# Patient Record
Sex: Female | Born: 1935 | Race: White | Hispanic: No | Smoking: Never smoker
Health system: Southern US, Community
[De-identification: ages and names within clinical notes are randomized; demographics above are authoritative.]

## PROBLEM LIST (undated history)

## (undated) DIAGNOSIS — S329XXA Fracture of unspecified parts of lumbosacral spine and pelvis, initial encounter for closed fracture: Secondary | ICD-10-CM

## (undated) DIAGNOSIS — I4891 Unspecified atrial fibrillation: Secondary | ICD-10-CM

## (undated) DIAGNOSIS — I2510 Atherosclerotic heart disease of native coronary artery without angina pectoris: Secondary | ICD-10-CM

## (undated) DIAGNOSIS — G14 Postpolio syndrome: Secondary | ICD-10-CM

## (undated) DIAGNOSIS — I639 Cerebral infarction, unspecified: Secondary | ICD-10-CM

## (undated) DIAGNOSIS — E785 Hyperlipidemia, unspecified: Secondary | ICD-10-CM

## (undated) DIAGNOSIS — R0609 Other forms of dyspnea: Secondary | ICD-10-CM

## (undated) DIAGNOSIS — N3010 Interstitial cystitis (chronic) without hematuria: Secondary | ICD-10-CM

## (undated) DIAGNOSIS — R0989 Other specified symptoms and signs involving the circulatory and respiratory systems: Secondary | ICD-10-CM

## (undated) DIAGNOSIS — Z951 Presence of aortocoronary bypass graft: Secondary | ICD-10-CM

## (undated) DIAGNOSIS — I119 Hypertensive heart disease without heart failure: Secondary | ICD-10-CM

## (undated) DIAGNOSIS — F4323 Adjustment disorder with mixed anxiety and depressed mood: Secondary | ICD-10-CM

## (undated) DIAGNOSIS — M8580 Other specified disorders of bone density and structure, unspecified site: Secondary | ICD-10-CM

## (undated) DIAGNOSIS — N390 Urinary tract infection, site not specified: Secondary | ICD-10-CM

## (undated) HISTORY — DX: Other specified disorders of bone density and structure, unspecified site: M85.80

## (undated) HISTORY — PX: HERNIA REPAIR: SHX51

## (undated) HISTORY — PX: CATARACT EXTRACTION: SUR2

## (undated) HISTORY — DX: Hypertensive heart disease without heart failure: I11.9

## (undated) HISTORY — PX: ABDOMINAL HYSTERECTOMY: SHX81

## (undated) HISTORY — PX: CHOLECYSTECTOMY: SHX55

## (undated) HISTORY — DX: Cerebral infarction, unspecified: I63.9

## (undated) HISTORY — DX: Interstitial cystitis (chronic) without hematuria: N30.10

## (undated) HISTORY — DX: Postpolio syndrome: G14

## (undated) HISTORY — PX: FRACTURE SURGERY: SHX138

## (undated) HISTORY — DX: Hyperlipidemia, unspecified: E78.5

## (undated) HISTORY — DX: Adjustment disorder with mixed anxiety and depressed mood: F43.23

## (undated) HISTORY — DX: Atherosclerotic heart disease of native coronary artery without angina pectoris: I25.10

## (undated) HISTORY — DX: Other specified symptoms and signs involving the circulatory and respiratory systems: R09.89

## (undated) HISTORY — DX: Other forms of dyspnea: R06.09

---

## 1992-10-27 ENCOUNTER — Encounter (INDEPENDENT_AMBULATORY_CARE_PROVIDER_SITE_OTHER): Payer: Self-pay | Admitting: *Deleted

## 1992-11-15 ENCOUNTER — Encounter (INDEPENDENT_AMBULATORY_CARE_PROVIDER_SITE_OTHER): Payer: Self-pay | Admitting: *Deleted

## 1998-03-24 ENCOUNTER — Ambulatory Visit (HOSPITAL_BASED_OUTPATIENT_CLINIC_OR_DEPARTMENT_OTHER): Admission: RE | Admit: 1998-03-24 | Discharge: 1998-03-24 | Payer: Self-pay | Admitting: Orthopedic Surgery

## 1998-07-12 ENCOUNTER — Encounter: Admission: RE | Admit: 1998-07-12 | Discharge: 1998-10-10 | Payer: Self-pay | Admitting: Orthopedic Surgery

## 2002-03-17 ENCOUNTER — Encounter: Admission: RE | Admit: 2002-03-17 | Discharge: 2002-03-17 | Payer: Self-pay | Admitting: Family Medicine

## 2002-03-17 ENCOUNTER — Encounter: Payer: Self-pay | Admitting: Family Medicine

## 2003-07-09 ENCOUNTER — Other Ambulatory Visit: Admission: RE | Admit: 2003-07-09 | Discharge: 2003-07-09 | Payer: Self-pay | Admitting: Family Medicine

## 2003-08-27 ENCOUNTER — Encounter: Payer: Self-pay | Admitting: Gastroenterology

## 2003-08-27 DIAGNOSIS — K5730 Diverticulosis of large intestine without perforation or abscess without bleeding: Secondary | ICD-10-CM | POA: Insufficient documentation

## 2005-10-30 ENCOUNTER — Other Ambulatory Visit: Admission: RE | Admit: 2005-10-30 | Discharge: 2005-10-30 | Payer: Self-pay | Admitting: Family Medicine

## 2006-02-16 ENCOUNTER — Encounter: Admission: RE | Admit: 2006-02-16 | Discharge: 2006-02-16 | Payer: Self-pay | Admitting: Nephrology

## 2006-03-14 ENCOUNTER — Ambulatory Visit: Payer: Self-pay

## 2006-11-27 HISTORY — PX: CARDIAC CATHETERIZATION: SHX172

## 2007-02-25 ENCOUNTER — Ambulatory Visit (HOSPITAL_COMMUNITY): Admission: RE | Admit: 2007-02-25 | Discharge: 2007-02-25 | Payer: Self-pay | Admitting: Family Medicine

## 2007-03-20 ENCOUNTER — Ambulatory Visit: Payer: Self-pay | Admitting: Gastroenterology

## 2007-03-20 LAB — CONVERTED CEMR LAB
Bilirubin, Direct: 0.2 mg/dL (ref 0.0–0.3)
Ceruloplasmin: 41 mg/dL (ref 21–63)
Ferritin: 115.1 ng/mL (ref 10.0–291.0)
HCT: 41 % (ref 36.0–46.0)
HCV Ab: NEGATIVE
INR: 1 (ref 0.9–2.0)
Prothrombin Time: 12.1 s (ref 10.0–14.0)
Total Protein: 8.1 g/dL (ref 6.0–8.3)
aPTT: 32.4 s (ref 26.5–36.5)

## 2007-04-01 ENCOUNTER — Ambulatory Visit (HOSPITAL_COMMUNITY): Admission: RE | Admit: 2007-04-01 | Discharge: 2007-04-01 | Payer: Self-pay | Admitting: Gastroenterology

## 2007-04-02 ENCOUNTER — Ambulatory Visit: Payer: Self-pay | Admitting: Gastroenterology

## 2007-04-17 ENCOUNTER — Ambulatory Visit (HOSPITAL_COMMUNITY): Admission: RE | Admit: 2007-04-17 | Discharge: 2007-04-17 | Payer: Self-pay | Admitting: Gastroenterology

## 2007-04-17 ENCOUNTER — Encounter: Payer: Self-pay | Admitting: Gastroenterology

## 2007-04-24 ENCOUNTER — Ambulatory Visit: Payer: Self-pay | Admitting: Gastroenterology

## 2007-04-25 ENCOUNTER — Ambulatory Visit: Payer: Self-pay | Admitting: Gastroenterology

## 2007-06-13 ENCOUNTER — Ambulatory Visit: Payer: Self-pay | Admitting: Cardiology

## 2007-06-13 ENCOUNTER — Inpatient Hospital Stay (HOSPITAL_COMMUNITY): Admission: EM | Admit: 2007-06-13 | Discharge: 2007-06-16 | Payer: Self-pay | Admitting: Emergency Medicine

## 2007-07-24 ENCOUNTER — Ambulatory Visit: Payer: Self-pay | Admitting: Cardiology

## 2008-05-02 DIAGNOSIS — I10 Essential (primary) hypertension: Secondary | ICD-10-CM | POA: Insufficient documentation

## 2008-05-02 DIAGNOSIS — B91 Sequelae of poliomyelitis: Secondary | ICD-10-CM | POA: Insufficient documentation

## 2008-05-02 DIAGNOSIS — K738 Other chronic hepatitis, not elsewhere classified: Secondary | ICD-10-CM | POA: Insufficient documentation

## 2008-05-02 DIAGNOSIS — E7800 Pure hypercholesterolemia, unspecified: Secondary | ICD-10-CM | POA: Insufficient documentation

## 2008-05-02 DIAGNOSIS — N3010 Interstitial cystitis (chronic) without hematuria: Secondary | ICD-10-CM

## 2008-05-02 DIAGNOSIS — C549 Malignant neoplasm of corpus uteri, unspecified: Secondary | ICD-10-CM

## 2008-08-11 ENCOUNTER — Encounter: Payer: Self-pay | Admitting: Gastroenterology

## 2009-03-11 ENCOUNTER — Telehealth: Payer: Self-pay | Admitting: Gastroenterology

## 2009-07-25 ENCOUNTER — Encounter: Payer: Self-pay | Admitting: Cardiology

## 2009-12-15 LAB — HM DEXA SCAN

## 2011-02-15 ENCOUNTER — Encounter: Payer: Self-pay | Admitting: Family Medicine

## 2011-02-15 DIAGNOSIS — R319 Hematuria, unspecified: Secondary | ICD-10-CM | POA: Insufficient documentation

## 2011-02-15 DIAGNOSIS — M810 Age-related osteoporosis without current pathological fracture: Secondary | ICD-10-CM | POA: Insufficient documentation

## 2011-02-15 DIAGNOSIS — R0609 Other forms of dyspnea: Secondary | ICD-10-CM

## 2011-02-15 DIAGNOSIS — I119 Hypertensive heart disease without heart failure: Secondary | ICD-10-CM

## 2011-02-15 DIAGNOSIS — N289 Disorder of kidney and ureter, unspecified: Secondary | ICD-10-CM

## 2011-02-15 DIAGNOSIS — N3010 Interstitial cystitis (chronic) without hematuria: Secondary | ICD-10-CM

## 2011-02-15 DIAGNOSIS — I2510 Atherosclerotic heart disease of native coronary artery without angina pectoris: Secondary | ICD-10-CM | POA: Insufficient documentation

## 2011-02-15 DIAGNOSIS — F4323 Adjustment disorder with mixed anxiety and depressed mood: Secondary | ICD-10-CM | POA: Insufficient documentation

## 2011-02-15 DIAGNOSIS — E785 Hyperlipidemia, unspecified: Secondary | ICD-10-CM

## 2011-02-15 DIAGNOSIS — M8580 Other specified disorders of bone density and structure, unspecified site: Secondary | ICD-10-CM

## 2011-04-11 NOTE — Cardiovascular Report (Signed)
Emily Wagner, [name]:  [id number]   MEDICAL RECORD NO.:  [id number]          PATIENT TYPE:  INP   LOCATION:  2905                         FACILITY:  [facility]   PHYSICIAN:  Everardo Beals. [name], MD, FACCDATE OF BIRTH:  1933/08/29   DATE OF PROCEDURE:  06/14/2007  DATE OF DISCHARGE:                            CARDIAC CATHETERIZATION   CLINICAL HISTORY:  Mr. Emily Wagner is 77 years old.  She has a history of  nonobstructive coronary disease at catheterization in 1990 by Dr.  Riley Kill with a 60% lesion in the LAD.  Yesterday while at the beauty  shop she developed some back pain and then became lightheaded and dizzy  and fell to the floor and almost completely blacked out.  EMS was called  and her pulse was in the 40s and her blood pressure was in the 80s.  She  responded to treatment and improved and was brought to the hospital for  further evaluation.  She was scheduled for evaluation with cardiac  catheterization to rule out an ischemic etiology.   PROCEDURE:  The procedure was performed via the right femoral artery  using an arterial sheath and 6-French preformed coronary catheters.  A  front wall arterial puncture was performed and Omnipaque contrast was  used.  The right femoral artery was closed with an Angio-Seal at the end  of the procedure.  The patient tolerated the procedure well and left the  laboratory in satisfactory condition.   RESULTS:  The aortic pressure was 125/70 with a mean of 103 and the left  ventricular pressure was 125/15.   The left main coronary artery:  The left main coronary was free of  significant disease.   Left anterior descending artery:  The left anterior descending artery  gave rise to two diagonal branches and a large septal perforator and  then two smaller diagonal branches.  There was moderate calcification in  the proximal LAD.  There was a 70% stenosis located at the takeoff of  the first diagonal branch.  There was a 60% stenosis  in the mid vessel  just after the septal perforator.   The circumflex artery:  The circumflex artery gave rise to a small  marginal branch and a large posterolateral branch.  These vessels were  free of significant disease.   The right coronary artery:  The right coronary artery was a moderate-  sized vessel and gave rise to a conus branch, two right ventricular  branches, a posterior descending branch and three posterolateral  branches.  This vessel was irregular but there was no major obstruction.   No left ventriculogram was performed due to the patient's creatinine of  1.7.   CONCLUSION:  Coronary artery disease with 70% proximal and 60% mid  stenosis in the left anterior descending artery, no major obstruction in  the circumflex or right coronary arteries.   RECOMMENDATIONS:  It is not clear whether the lesions in the LAD are  flow-limiting.  Will plan evaluation with an adenosine rest-stress  Myoview scan tomorrow.  If she has anterior ischemia, then we will  consider  PCI of the lesions in the LAD.  If her scan is negative, then  we will look for other cause of syncope.  It is possible that her  syncope could have been vasovagal syncope.  We will also schedule an  echocardiogram.      Emily R. [name], MD, [facility]  Electronically Signed     BRB/MEDQ  D:  06/14/2007  T:  06/15/2007  Job:  [id number]   cc:   [name], M.D.  [name], MD, [facility]  Cardiopulmonary Lab

## 2011-04-11 NOTE — H&P (Signed)
NAMEDEWAYNE, Emily Wagner NO.:  [id number]   MEDICAL RECORD NO.:  [id number]          PATIENT TYPE:  EMS   LOCATION:  MAJO                         FACILITY:  [facility]   PHYSICIAN:  Marcellus Scott, MD     DATE OF BIRTH:  [birth year]   DATE OF ADMISSION:  06/13/2007  DATE OF DISCHARGE:                              HISTORY & PHYSICAL   PRIMARY MEDICAL DOCTOR:  [name], M.D.   CARDIOLOGIST:  [name], MD, [facility].   NEPHROLOGIST:  Wilber Bihari. Caryn Section, M.D.   GASTROENTEROLOGIST:  Barbette Hair. Arlyce Dice, MD, [facility].   CHIEF COMPLAINT:  Presyncope, generalized weakness, bradycardia with  hypotension.   HISTORY OF PRESENT ILLNESS:  Emily Wagner is a pleasant 77 year old  Caucasian female patient with extensive past medical history, as  indicated below.  She was in her usual state of health until  approximately 11:45 a.m. today.  She was sitting on the hairdressers  chair for approximately 20 minutes when she started feeling dizzy and  lightheaded.  After she finished with her hairdressing, she got off the  chair, and the symptoms persisted.  She walked to a door and  subsequently felt overwhelmingly weak and collapsed to the floor;  however, the patient did not lose consciousness fully.  At no point does  she give a history of chest pain, palpitations, dyspnea.  She is said to  have lost color and was cold and clammy, according to the bystanders.  EMS was called, and she was noted to have a heart rate in the 40s and  systolic blood pressure in the 80s.  She was given a dose of Atropine  and started on an IV dopamine drip.  An external pacemaker was placed.  The patient was transported to the emergency room.  After approximately  3-1/2 to 4 hours, the patient currently says that she is feeling 80%  better with the weakness almost resolved.  She continues to deny any  cardiorespiratory symptoms.  The patient denies any asymmetrical limb  weakness, slurred  speech, twisting of the mouth.  Her pacemaker is now  turned off.  She is off dopamine and is maintaining a heart rate in the  high 50s and systolic blood pressure in the 90s.   PAST MEDICAL HISTORY:  1. Hypertension.  2. Hyperlipidemia.  3. Single vessel coronary artery disease, status post cath      approximately three years ago.  4. Chronic kidney disease secondary to Feldene/NSAID use.  5. Chronic active hepatitis.  6. 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  7. 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  8. Polio as a child.   PAST SURGICAL HISTORY:  1. Hysterectomy secondary to endometrial carcinoma.  2. Appendectomy.  3. Cholecystectomy.   ALLERGIES:  Patient has multiple allergies.  1. SULFA.  2. PAIN MEDICATIONS, CODEINE, DEMEROL, DARVON.  She also claims she      does not take any over-the-counter pain medications.  3. NONSTEROIDAL ANTI-INFLAMMATORY DRUGS.  4. MACROBID.  5. LIPID-LOWERING MEDICATIONS.  6. ALL OSTEOPOROSIS MEDICATIONS.   CURRENT MEDICATIONS:  1. Propranolol 40 mg p.o. daily.  2. Fexofenadine 180 mg p.o. daily.  3. Verapamil 240 mg p.o. daily.  4. Lorazepam 0.5 mg p.o. nightly.  5. Actigall 300 mg p.o. 3 times a day.   FAMILY HISTORY:  1. Sister who is at the bedside with a history of coronary artery      disease, thyroid cancer.  2. Patient's brother died at age 35 with lung cancer with mets to      brain.  3. Patient's mother died at age 25 of an MI.  4. Patient's father died at age 20 years.  5. Nephew died at age 31 of an MI.   SOCIAL HISTORY:  Patient is widowed.  She works as an Public house manager at  a [employer]  She has no history of smoking, alcohol, or drug  abuse.  She is physically active at home with no cardiorespiratory  symptoms.   REVIEW OF SYSTEMS:  Over 10 systems reviewed in detail with no  contributory features.   PHYSICAL EXAMINATION:  GENERAL:  Emily Wagner is a moderately built and  nourished female patient in no  obvious distress.  VITAL SIGNS:  Temperature afebrile.  Pulse between 55-60 per minute,  regular.  Blood pressure 90/48.  Respirations 16 per minute.  Saturating  at 97% on 4 liters per minute of oxygen via nasal cannula.  HEENT:  Normocephalic and atraumatic.  Bilateral immature cataracts.  Pupils are bilaterally equal, round and reactive to light and  accommodation.  Oral cavity with multiple dental filling and a few  missing teeth but no oropharyngeal erythema or thrush.  NECK:  Without JVD, carotid bruit, lymphadenopathy, or goiter.  Supple.  LYMPHATICS: No lymphadenopathy.  RESPIRATORY:  Clear to auscultation bilaterally.  CARDIOVASCULAR:  First and second heart sounds are heard.  Soft.  No  third or fourth heart sounds, murmurs, rubs, gallops, or clicks.  ABDOMEN:  Nondistended, nontender, no organomegaly or mass appreciated.  Bowel sounds are preserved.  CENTRAL NERVOUS SYSTEM:  Patient is awake, alert and oriented x3 with no  focal neurological deficits.  EXTREMITIES:  No clubbing, cyanosis or edema.  Peripheral pulses are  symmetrically well felt.  SKIN:  Slightly cool.   LAB DATA:  Hepatic panel with total bilirubin of 1.2, direct bilirubin  0.2, indirect bilirubin 1, alkaline phosphatase 86, AST 28, ALT 14,  total protein 6.8, albumin 3.  Point-of-care cardiac markers x2 are  negative.  Creatinine of 1.7, unclear what the patient's baseline  creatinine is.  Venous pH 7.35, pCO2 39.6, bicarb 22, sodium 139,  potassium 4.1, chloride 109, glucose 96, BUN 20, INR 1.  CBC with  hemoglobin of 13.3, hematocrit 38.8, white blood cells 7, platelets 210.  Patient had an AST of 245 in April, 2008 and ALT of 158 with alk phos of  142.   Chest x-ray reviewed by me and reported as linear atelectasis and  bibasilar scarring.   EKG with sinus bradycardia at 51 beats per minute with first-degree AV  block with P-R interval of 213.  There are Q waves in the inferior leads  and anterior  leads.  There is no prior EKG to compare in patient's old  records, and the ER systems are down.   ASSESSMENT/PLAN:  1. Symptomatic sinus bradycardia and hypotension, unclear etiology:      Questionable secondary to medications; however, the patient has not      changed her doses recently nor has she taken extra dosages.  Also,      to rule  out conduction system abnormalities Vs vasovagal.  Will      admit the patient to step-down unit. Will hold her negative      inotropic medications of propranolol and verapamil.  Will cycle the      patient's cardiac enzymes and check D-dimer.  Will also obtain an      echocardiogram.  Will resuscitate with IV fluids.  She has improved      since initial presentation.  For pressors PRN and may need a      temporary pacemaker. Cardiology has been consulted.  2. Presyncope secondary to problem #1:  Management per problem #1.  3. Chronic kidney disease, unclear what the patient's baseline      creatinine is; however, to continue IV fluids and monitor the      patient's basic metabolic panel.  4. Chronic active hepatitis.  ANA was negative in the past.  The      patient's hepatic panel seems to have improved compared to the      prior one.  5. The patient has a living will and is no code blue.      Marcellus Scott, MD  Electronically Signed     AH/MEDQ  D:  06/13/2007  T:  06/13/2007  Job:  [id number]   cc:   Wilber Bihari. Caryn Section, M.D.  [name], MD, [facility]  [name], M.D.  Thomas C. Wall, MD, [name]

## 2011-04-11 NOTE — Assessment & Plan Note (Signed)
[facility]                         GASTROENTEROLOGY OFFICE NOTE   NAME:Hedstrom, ELEFTHERIA TABORN                    MRN:          [id number]  DATE:04/24/2007                            DOB:          02/17/1933    PROBLEM LIST:  Chronic hepatitis.   Mrs. Nida has returned following liver biopsy.  Liver biopsy  demonstrated a chronic active hepatitis, consisting of inflamed bile  ducts and a rare poorly formed granuloma.  Prior MRCP was negative.  Her  AMA was negative.  Only serology that was positive was an anti-smooth  muscle antibody that was 114.  Mrs. Chamblee feels well and has no GI  complaints.   PHYSICAL EXAMINATION:  VITAL SIGNS:  Pulse 64, blood pressure 120/74,  weight 170.   IMPRESSION:  Chronic abnormal liver tests, probably due to either an  autoimmune cholangitis or primary biliary cirrhosis.  Mitigating against  the latter is a negative AMA.  The MRI was not suggestive of sclerosing  cholangitis.   RECOMMENDATIONS:  1. Begin Actigall 300 mg t.i.d.  2. I have referred Mrs. Turcott to Dr. Sharon Mt for a second      opinion.  Should he feel an ERCP is indicated, then I will proceed      with that test.     Barbette Hair. Arlyce Dice, MD,FACG  Electronically Signed    RDK/MedQ  DD: 04/24/2007  DT: 04/24/2007  Job #: [id number]   cc:   [name], M.D.  Sharon Mt, M.D.

## 2011-04-11 NOTE — Consult Note (Signed)
Emily, Wagner NO.:  [id number]   MEDICAL RECORD NO.:  [id number]          PATIENT TYPE:  INP   LOCATION:  2905                         FACILITY:  [facility]   PHYSICIAN:  Jesse Sans. Wall, MD, FACCDATE OF BIRTH:  1933-11-01   DATE OF CONSULTATION:  06/13/2007  DATE OF DISCHARGE:                                 CONSULTATION   We were asked by the hospitalist service to evaluate [name],  who had a presyncopal event today followed by severe bradycardia and  hypotension.   HISTORY OF PRESENT ILLNESS:  She is 78 years of age and carries a  diagnosis of coronary artery disease.  She had a heart catheterization  in 1990 and apparently had a 60-70% LAD at that time.  She has done well  without intervention.  The last objective assessment of coronary disease  was a Cardiolite in 2001 by Dr. Antoine Poche, which showed an EF 78% with no  ischemia.   She was in the beauty shop today in [place].  While she was sitting  there in the chair start to not feel well and got nauseated.  She got up  and got very lightheaded and then her vision blacked out.  She did not  lose complete consciousness and could hear the entire time.  She did  drop to the floor.   When EMS arrived, they could not palpate a pulse or blood pressure.  Her  blood sugar was 107.  They gave her a milligram of atropine and paced  her en route.  Her initial blood pressure was 80/50, heart rate was in  the 40s.   Here in the emergency room she was started on dopamine at 5 mcg.  Her  pressure and heart rate came up and then it was discontinued.  Her blood  pressure and heart rate dropped at that point.   She is now comfortable lying flat and has a blood pressure running  around 110/70.  Her pulse is in the 60s and 70s in sinus rhythm.  Her  first EKG shows sinus bradycardia with a first-degree AV block with no  ST-segment changes.  She does have small Q's inferiorly suggestive of a  previous inferior  wall infarct.   Her past medical history, in addition to the above she has:  1. History of hypertension.  2. Hyperlipidemia, but intolerant to all statins.  3. History of polio as a child.  4. Multiple drug allergies.   Allergies include SULFA, TRIPLE ANTIBIOTIC OINTMENT, NONSTEROIDALS,  SULFA, CODEINE, DEMEROL, DARVON and FELDENE.   She has had a hysterectomy secondary to endometrial carcinoma,  appendectomy, cholecystectomy.   She also has chronic renal insufficiency with a creatinine around 1.7,  which is what it is day.   Her medications are:  1. Propranolol 40 mg once a day.  2. Fexofenadine 180 mg a day.  3. Verapamil 240 mg a day.  4. Lorazepam 0.5 daily.  5. Ursodiol 300 mg p.o. t.i.d.   Her family history is full of coronary disease.  When I go through it,  it  is very positive in regards to her brothers, sister and mother.  Her  father died of an MI at age 38, mother at 70.   SOCIAL HISTORY:  She is widowed.  She is quite active at home.  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  She has two children.  She has  never smoked cigarettes.   REVIEW OF SYSTEMS:  Other than the HPI, is really negative.  She denies  feeling poorly in the last few days.  Her intake has been good.  She had  no nausea, vomiting, diarrhea.   The rest of the review of systems is negative.   EXAM:  Her blood is pressure currently 110 over about 70.  Her pulse is  in the 60s and 70s in sinus rhythm.  EKG shows no acute changes.  O2  saturations 95%, respiratory rate is 18 and unlabored.  She is wearing  O2.  She is lying flat in bed under one pillow with no shortness of breath.  She is slightly pale.  HEENT:  Normocephalic, atraumatic.  PERRLA, extraocular movements  intact.  She wears glasses.  Facial symmetry is normal.  Her speech is  normal.  Cranial nerves II-XII were intact.  Her teeth are crooked but  well-kept.  Her neck is supple.  Carotid upstrokes are equal bilaterally  without  bruits.  There is no JVD.  Thyroid is not enlarged.  Trachea is midline.  Lungs were clear.  Heart reveals a poorly-appreciated PMI.  She has a soft S1-S2 without  gallop.  Abdominal exam is soft, good bowel sounds, no midline bruit.  There is  no hepatomegaly.  EXTREMITIES:  No sinus, clubbing or edema.  Pulses are present.  She has  varicose veins, mostly superficial.  There is no sign of DVT.  Neuro exam is grossly intact at present.   Chest x-ray is pending.   initial laboratory data shows negative point of care markers.  Her  potassium is 4.1, BUN is 20, creatinine 1.7.  Her hemoglobin is 13.9,  white count is 7000, platelets 210,000.   ASSESSMENT:  Ms. Spittler had become hypotensive while sitting in the chair  in the beauty parlor.  She then sat up and probably had presyncope as a  consequence of severe hypotension.  She then was bradycardic.  This begs  the question of an acute coronary ischemic event.   PLAN:  1. Dopamine is now off and I would continue it off at this time as      long as her pressure and blood pressure hold.  2. Aspirin.  3. Heparin.  4. Check serial cardiac enzymes.  5. D-dimer.  6. Intravenous fluids, D5 normal at 125 mL per hour, not only to      rehydrate but to hydrate prior to her catheterization.  7. Cardiac catheterization tomorrow.  Indications, risks and potential      benefits have been discussed with her and her family.  They agree      to proceed.  8. Obviously, hold beta blockers and calcium channel blockers.      Thomas C. Daleen Squibb, MD, [facility]  Electronically Signed     TCW/MEDQ  D:  06/13/2007  T:  06/14/2007  Job:  [id number]   cc:   [name], MD, [facility]  [name], M.D.

## 2011-04-11 NOTE — Assessment & Plan Note (Signed)
[facility]ALTHCARE                            CARDIOLOGY OFFICE NOTE   NAME:Emily Wagner, Emily Wagner                    MRN:          [id number]  DATE:07/24/2007                            DOB:          [birth year]    PRIMARY CARE PHYSICIAN:  [name], M.D.   REASON FOR PRESENTATION:  Evaluate patient with presyncope and coronary  disease.   HISTORY OF PRESENT ILLNESS:  Patient presented on July 17 to [facility]  with an episode of presyncope.  She was hypotensive.  She had sinus  bradycardia.  Because of inferior Q waves and this constellation of  symptoms, she did have a cardiac catheterization demonstrating the left  main to be normal.  The LAD had a 70% stenosis proximally, followed by  60% stenosis.  The circumflex was free of disease.  The right coronary  artery had no major obstruction.  No EF was obtained secondary to mild  renal insufficiency.  Patient had an in-hospital stress perfusion study,  which demonstrated the EF to be 71% with no evidence of ischemia or  infarct.  The patient was take off propranolol, which she had been on  for tremors.  She also had her dose of verapamil reduced to 120.  She  has had no presyncope or syncope since that time.  She remains quite  anxious.  She takes her blood pressure four times a day.  It does  fluctuate with rare readings in the 90s and occasionally the 150s.  The  vast predominance are between 120 and 130s.  She has had no chest  discomfort or neck discomfort.  She has had no palpitations.  She has no  shortness of breath, PND, or orthopnea.  She is still having a worsening  tremor.   PAST MEDICAL HISTORY:  Hypertension, hyperlipidemia, coronary artery  disease, as described, chronic kidney disease (secondary to  nonsteroidals), chronic active hepatitis, questionable autoimmune  cholangitis, questionable primary biliary cirrhosis, polio as a child,  hysterectomy secondary to endometrial carcinoma,  appendectomy,  cholecystectomy.   ALLERGIES/INTOLERANCES:  SULFA, CODEINE, DEMEROL, DARVON, MOST PAIN  MEDICATIONS, NONSTEROIDALS, MACROBID, LIPID-LOWERING DRUGS, ALL  OSTEOPOROSIS MEDICATIONS.   MEDICATIONS:  1. Verapamil 120 mg daily.  2. Lorazepam 0.5 mg daily.   REVIEW OF SYSTEMS:  As stated in the HPI, otherwise negative for other  systems.   PHYSICAL EXAMINATION:  Patient is in no acute distress.  Blood pressure 158/98, heart rate 90 and regular.  NECK:  No jugular venous distention at 45 degrees.  Carotid upstroke  brisk and symmetric.  No bruits, no thyromegaly.  LYMPHATICS:  No adenopathy.  LUNGS:  Clear to auscultation bilaterally.  CHEST:  Unremarkable.  HEART:  PMI not displaced or sustained.  S1 and S2 within normal limits.  No S3, no S4, no clicks, rubs, or murmurs.  ABDOMEN:  Flat, positive bowel sounds.  Normal in frequency and pitch.  No bruits, rebound, guarding.  There are no midline pulsatile masses.  No hepatomegaly, no splenomegaly.  SKIN:  No rashes, no nodules.  EXTREMITIES:  Pulses 2+ throughout.  No edema.  EKG:  Sinus rhythm, poor anterior R wave progression (suggest lead  placement), questionable old inferior infarct, nonspecific lateral T  wave flattening, poor anterior R wave progression is new compared to  previously.   ASSESSMENT/PLAN:  1. Coronary disease:  Patient had a cath and Cardiolite as described.      No further workup is planned.  She needs aggressive risk reduction.      She will not tolerate statins.  She will agree to take an aspirin      81 mg, which I think is indicated.  She will not tolerate beta      blockers.  2. Hypertension:  Her blood pressure is actually very well controlled.      I told her she only needs to take her blood pressure 4-5 times a      week, not four times a day.  She can remain on the current dose of      verapamil.  3. Tremors:  I told her to discuss this with Dr. Christell Constant.  I think these      will be  difficult to treat, as she has had so many medication      intolerances.  4. Followup:  We will see the patient again in six months or sooner if      needed.     [name], MD, [facility]  Electronically Signed    JH/MedQ  DD: 07/24/2007  DT: 07/24/2007  Job #: [id number]

## 2011-04-11 NOTE — Assessment & Plan Note (Signed)
[place] [facility]                         GASTROENTEROLOGY OFFICE NOTE   NAME:Asay, MARQUIS DILES                    MRN:          [id number]  DATE:04/02/2007                            DOB:          1933/05/23    PROBLEM:  Abnormal liver tests.  Mrs. Kimm has returned for ongoing  evaluation.  Serologies for abnormal liver test are positive for anti-  smooth muscle antibody.  There is no evidence for viral hepatitis or  other congenital abnormalities of the liver.  She underwent an MRI and  MRCP that essentially was negative.  She continues to do well.   EXAM:  Pulse 68, blood pressure 132/80, weight 170.   IMPRESSION:  Persistent abnormal liver tests.  An anti-smooth muscle  antibody can be seen with sclerosing cholangitis, but there was no  evidence for this by MRCP.   RECOMMENDATIONS:  Liver biopsy.     Barbette Hair. Arlyce Dice, MD,FACG  Electronically Signed    RDK/MedQ  DD: 04/02/2007  DT: 04/02/2007  Job #: [id number]   cc:   [name], M.D.

## 2011-04-11 NOTE — Discharge Summary (Signed)
Emily Wagner, HUNTON NO.:  [id number]   MEDICAL RECORD NO.:  [id number]          PATIENT TYPE:  INP   LOCATION:  2010                         FACILITY:  [facility]   PHYSICIAN:  Joellyn Rued, PA-C     DATE OF BIRTH:  June 15, 1933   DATE OF ADMISSION:  06/13/2007  DATE OF DISCHARGE:  06/16/2007                         DISCHARGE SUMMARY - REFERRING   DISCHARGE DIAGNOSES:  1. Near syncope  2. One vessel coronary artery disease, continue medical treatment  3. Bradycardia, resolved  4. Back discomfort resolved.Marland Kitchen  History as noted below.   PROCEDURES PERFORMED:  Cardiac catheterization 06/14/2007 by Dr. Charlies Constable.   BRIEF HISTORY:  [name] is a 76 year old white female at approximately  11:45 a.m. on the morning of admission while at the ha[employer], after  sitting in a chair for approximately 20 minutes she became very dizzy  and lightheaded. After finish with her appointment when she got up off  the chair her symptoms persisted.  She subsequently felt overwhelmingly  week and collapsed to the floor.  She did not fully lose consciousness.  She was reported to be pale, cold and clammy according to bystanders.  EMS reported heart rate in the 40s, blood pressure in the 80s.  She  received atropine and dopamine and external pacemaker.  After  approximately 4 hours she was feeling 80% better.  She denies prior  occurrences.   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   ALLERGIES:  She has multiple allergies supposedly to NSAIDS, CODEINE,  DEMEROL, DARVON.  OVER-THE-COUNTER PAIN MEDICATIONS, MACROBID,  LIPID  LOWERING MEDICATIONS, ALL OSTEOPOROSIS MEDICATIONS, AND SULFA.Marland Kitchen   LABORATORY:  Admission H&H 13.9, 41.0,  normal indices, platelets 210,  WBCs 7.0.  At the time of discharge H&H 12.9 and 37.1, normal indices,  platelets 164, WBCs 5.1.  Admission PT 13.8, D-dimer was elevated at  1.45 on the 18th. Admission sodium 139, potassium 3.9, BUN 18,  creatinine 1.30, glucose 106, normal LFTs.  At the time of discharge  sodium was 142, potassium 3.1, BUN six, creatinine 1.03.  CK MBs,  relative index and troponins were negative x2.  Fasting lipids on the  17th showed a total cholesterol 182, triglycerides 79, HDL 31, LDL 135.  TSH 2.510.  Chest X-Ray on the 17th showed linear atelectasis versus  basilar scarring.  Hospital EKGs Normal sinus rhythm, normal axis,  delayed R-wave, nonspecific ST-T wave changes.   HOSPITAL COURSE:  Overnight she did not have any complaints.  Enzymes  and EKGs ruled out myocardial infarction.  Catheterization in 1990 had  showed a 60-70% LAD.  Given her symptoms it was felt that she should  undergo cardiac catheterization on 06/14/2007.  This was performed by  Dr. [name].  LV gram was not performed.  She had some RCA  irregularities, 70% proximal LAD, 60% mid LAD.  Adenosine Myoview was  performed to further  evaluate her symptoms.  Unfortunately, the Myoview  could not be done on Saturday, thus it was arranged for Sunday due to a  cancellation.  Adenosine Myoview performed on 06/16/2007 showed EF of  71%,  no signs of ischemia or scarring.  Dr. Ladona Ridgel felt that the  patient could be discharged home.   DISPOSITION:  The patient is discharged home.  Wound care and activities  are per supplemental sheet.  She was given permission return to work on  06/18/2007.  She was asked to continue her home medications which  include propranolol 40 mg daily, fexofenadine 180 mg daily, verapamil  240 daily, lorazepam 0.5 q.h.s., actigall 300 mg t.i.d. She was asked to  check with Dr. Caryn Section if she could try taking a coated baby aspirin 81 mg  daily given her coronary artery disease.  She  was asked to follow up  with Dr. Antoine Poche in 4-6 weeks and please call and make an appointment.  Dr. Christell Constant as needed.   Discharge time 30 minutes      Joellyn Rued, PA-C     EW/MEDQ  D:  06/16/2007  T:  06/16/2007  Job:  [id number]   cc:   [name], M.D.  [name], MD, [facility]  Richard F. Caryn Section, M.D.  Barbette Hair. Arlyce Dice, MD,FACG

## 2011-04-14 NOTE — Letter (Signed)
March 20, 2007    [name], M.D.  [address] [place], [place] [address]   RE:  KEMAYA, DORNER  MRN:  [id number]  /  DOB:  [birth year]   Dear Dr. Christell Constant:   Upon your kind referral, I had the pleasure of evaluating your patient  and I am pleased to offer my findings. I saw Emily Wagner in the office  today. Enclosed is a copy of my progress note that details my findings  and recommendations.   Thank you for the opportunity to participate in your patient's care.    Sincerely,      Barbette Hair. Arlyce Dice, MD,FACG  Electronically Signed    RDK/MedQ  DD: 03/20/2007  DT: 03/20/2007  Job #: [id number]   CC:    Wilber Bihari. Caryn Section, M.D.

## 2011-04-14 NOTE — Assessment & Plan Note (Signed)
[facility]                         GASTROENTEROLOGY OFFICE NOTE   NAME:Emily Wagner, Emily Wagner                          MRN:          [id number]  DATE:03/20/2007                            DOB:          09-Nov-1933    REASON FOR CONSULTATION:  Abnormal liver tests.   HISTORY OF PRESENT ILLNESS:  The patient is a pleasant 77 year old white  female, referred through the courtesy of Dr. [name] for  evaluation.  Abnormal liver tests have recently been noted.  The patient  has no GI complaints, including abdominal pain or pruritus.  There is no  history of liver disease or jaundice.  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  In early March one  stool tested Hemoccult positive.  This was repeated and apparently  stools were heme-negative.  There is no history of alcohol or drug use.  An abdominal ultrasound did not demonstrate any abnormalities.   LABORATORY DATA:  On February 13, 2007, alkaline phosphatase was 178, ALT  117 and AST 190.  Previously alkaline phosphatase was 153 (normal to  165).  ALT was 52 and AST was 74.  On January 30, 2007, hemoglobin was  14.5.   There is no family history of liver disease.   PAST MEDICAL/SURGICAL HISTORY:  1. Pertinent for renal insufficiency, related to NSAID use.  2. She is status post cholecystectomy.  3. Status post hysterectomy.  4. Status post appendectomy.   FAMILY HISTORY:  Pertinent for heart disease in both parents.  A brother  has lung cancer.  Her father and a daughter have diabetes.   MEDICAL DECISION MAKING:  1. Propranolol.  2. Fexofenadine.  3. Verapamil.  4. Lorazepam.   ALLERGIES:  No known drug allergies.   SOCIAL HISTORY:  She neither smokes nor drinks.  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   REVIEW OF SYSTEMS:  Positive for joint pain and feet swelling and  fatigue.   PHYSICAL EXAMINATION:  VITAL SIGNS:  Pulse 60, blood  pressure 122/80,  weight 171 pounds.  HEENT: EOMI. PERRLA. Sclerae are anicteric.  Conjunctivae are pink.  NECK:  Supple without thyromegaly, adenopathy or carotid bruits.  CHEST:  Clear to auscultation and percussion without adventitious  sounds.  CARDIAC:  Regular rhythm; normal S1 S2.  There are no murmurs, gallops  or rubs.  ABDOMEN:  Bowel sounds are normoactive.  Abdomen is soft, non-tender and  non-distended.  There are no abdominal masses, tenderness, splenic  enlargement or hepatomegaly.  EXTREMITIES:  Full range of motion.  No cyanosis or clubbing.  She has  1+ ankle edema.  RECTAL:  Deferred.   IMPRESSION:  Abnormal liver tests.  The etiology is not apparent.  Underlying liver disease is less likely.  Subacute hepatitis must be  ruled out.  Medication effect is unlikely in the face of her current  medications.   RECOMMENDATION:  1. Follow liver function tests.  2. Check serologies for hepatitis-A, B and C, iron TIBC, ferritin, ANA  and alpha-1 antitrypsin levels.     Barbette Hair. Arlyce Dice, MD,FACG  Electronically Signed    RDK/MedQ  DD: 03/20/2007  DT: 03/20/2007  Job #: [id number]   cc:   [name], M.D.  Richard F. Caryn Section, M.D.

## 2011-04-19 ENCOUNTER — Encounter: Payer: Self-pay | Admitting: Cardiology

## 2011-09-11 LAB — COMPREHENSIVE METABOLIC PANEL
ALT: 17
AST: 28
Alkaline Phosphatase: 90
CO2: 27
Calcium: 8.7
GFR calc Af Amer: 49 — ABNORMAL LOW
Glucose, Bld: 106 — ABNORMAL HIGH
Potassium: 3.9
Sodium: 139

## 2011-09-11 LAB — LIPID PANEL
LDL Cholesterol: 135 — ABNORMAL HIGH
Total CHOL/HDL Ratio: 5.9
VLDL: 16

## 2011-09-11 LAB — CK TOTAL AND CKMB (NOT AT ARMC)
CK, MB: 0.7
Total CK: 32
Total CK: 34

## 2011-09-11 LAB — DIFFERENTIAL
Basophils Relative: 1
Lymphs Abs: 1.1
Monocytes Absolute: 0.5
Monocytes Relative: 7
Neutro Abs: 5.3

## 2011-09-11 LAB — POCT CARDIAC MARKERS
CKMB, poc: <1 — ABNORMAL LOW
CKMB, poc: <1 — ABNORMAL LOW
Myoglobin, poc: 105
Myoglobin, poc: 75
Operator id: 272551
Troponin i, poc: <0.05
Troponin i, poc: <0.05

## 2011-09-11 LAB — CBC
HCT: 35.4 — ABNORMAL LOW
HCT: 37.1
Hemoglobin: 12.2
Hemoglobin: 12.2
Hemoglobin: 13.3
MCHC: 34.1
MCHC: 34.2
MCV: 92.4
MCV: 93.1
Platelets: 154
Platelets: 164
RBC: 3.83 — ABNORMAL LOW
RBC: 4.12
RDW: 13.1
RDW: 13.4
WBC: 4.2
WBC: 7

## 2011-09-11 LAB — HEPATIC FUNCTION PANEL
AST: 28
Albumin: 3 — ABNORMAL LOW
Total Bilirubin: 1.2
Total Protein: 6.8

## 2011-09-11 LAB — I-STAT 8, (EC8 V) (CONVERTED LAB)
Acid-base deficit: 3 — ABNORMAL HIGH
Bicarbonate: 21.9
Chloride: 109
HCT: 41
Operator id: 146091
TCO2: 23
pCO2, Ven: 39.6 — ABNORMAL LOW
pH, Ven: 7.351 — ABNORMAL HIGH

## 2011-09-11 LAB — HEPARIN LEVEL (UNFRACTIONATED): Heparin Unfractionated: 0.73 — ABNORMAL HIGH

## 2011-09-11 LAB — BASIC METABOLIC PANEL
BUN: 6
BUN: 7
CO2: 25
Chloride: 110
Chloride: 114 — ABNORMAL HIGH
Glucose, Bld: 96
Potassium: 3.1 — ABNORMAL LOW
Potassium: 3.4 — ABNORMAL LOW

## 2011-09-11 LAB — PHOSPHORUS: Phosphorus: 3.8

## 2011-09-11 LAB — PROTIME-INR: INR: 1

## 2012-08-28 ENCOUNTER — Encounter: Payer: Self-pay | Admitting: Cardiology

## 2012-10-19 DIAGNOSIS — S72141A Displaced intertrochanteric fracture of right femur, initial encounter for closed fracture: Secondary | ICD-10-CM | POA: Insufficient documentation

## 2012-11-07 DIAGNOSIS — Z96649 Presence of unspecified artificial hip joint: Secondary | ICD-10-CM | POA: Insufficient documentation

## 2012-12-03 ENCOUNTER — Encounter: Payer: Self-pay | Admitting: Cardiology

## 2013-03-20 ENCOUNTER — Encounter: Payer: Self-pay | Admitting: Family Medicine

## 2013-03-20 ENCOUNTER — Ambulatory Visit (INDEPENDENT_AMBULATORY_CARE_PROVIDER_SITE_OTHER): Payer: Medicare Other | Admitting: Family Medicine

## 2013-03-20 VITALS — BP 142/84 | HR 66 | Temp 98.1°F | Ht 63.0 in | Wt 148.7 lb

## 2013-03-20 DIAGNOSIS — I2510 Atherosclerotic heart disease of native coronary artery without angina pectoris: Secondary | ICD-10-CM

## 2013-03-20 DIAGNOSIS — R609 Edema, unspecified: Secondary | ICD-10-CM

## 2013-03-20 DIAGNOSIS — R5383 Other fatigue: Secondary | ICD-10-CM

## 2013-03-20 DIAGNOSIS — I10 Essential (primary) hypertension: Secondary | ICD-10-CM

## 2013-03-20 DIAGNOSIS — N390 Urinary tract infection, site not specified: Secondary | ICD-10-CM

## 2013-03-20 DIAGNOSIS — E7800 Pure hypercholesterolemia, unspecified: Secondary | ICD-10-CM

## 2013-03-20 DIAGNOSIS — E559 Vitamin D deficiency, unspecified: Secondary | ICD-10-CM

## 2013-03-20 DIAGNOSIS — Z79899 Other long term (current) drug therapy: Secondary | ICD-10-CM

## 2013-03-20 DIAGNOSIS — I7090 Unspecified atherosclerosis: Secondary | ICD-10-CM

## 2013-03-20 DIAGNOSIS — R5381 Other malaise: Secondary | ICD-10-CM

## 2013-03-20 LAB — POCT CBC
Hemoglobin: 14.9 g/dL (ref 12.2–16.2)
MCH, POC: 31.2 pg (ref 27–31.2)
MCV: 91.8 fL (ref 80–97)
MPV: 8.3 fL (ref 0–99.8)
RBC: 4.8 M/uL (ref 4.04–5.48)

## 2013-03-20 LAB — POCT URINALYSIS DIPSTICK
Bilirubin, UA: NEGATIVE
Glucose, UA: NEGATIVE
Nitrite, UA: NEGATIVE
Spec Grav, UA: 1.015

## 2013-03-20 LAB — POCT UA - MICROSCOPIC ONLY
Casts, Ur, LPF, POC: NEGATIVE
Yeast, UA: NEGATIVE

## 2013-03-20 LAB — BASIC METABOLIC PANEL
Calcium: 9.3 mg/dL (ref 8.4–10.5)
Chloride: 107 mEq/L (ref 96–112)
Creat: 0.83 mg/dL (ref 0.50–1.10)
Sodium: 141 mEq/L (ref 135–145)

## 2013-03-20 LAB — THYROID PANEL WITH TSH
Free Thyroxine Index: 3.4 (ref 1.0–3.9)
T3 Uptake: 36.3 % (ref 22.5–37.0)

## 2013-03-20 LAB — LIPID PANEL: Cholesterol: 200 mg/dL (ref 0–200)

## 2013-03-20 LAB — HEPATIC FUNCTION PANEL
Bilirubin, Direct: 0.1 mg/dL (ref 0.0–0.3)
Indirect Bilirubin: 0.6 mg/dL (ref 0.0–0.9)

## 2013-03-20 MED ORDER — FUROSEMIDE 20 MG PO TABS: 20.0000 mg | ORAL_TABLET | Freq: Every day | ORAL | Status: DC

## 2013-03-20 NOTE — Patient Instructions (Addendum)
FOBT given Fall precautions discussed Continue current meds and therapeutic lifestyle changes Try Nasacort AQ over the counter 1-2 sprays each nostril as needed for allergy Increase Senokot or add MiraLax as needed for constipation Try the furosemide to see if this helps some of the edema Watch sodium intake

## 2013-03-20 NOTE — Progress Notes (Signed)
  Subjective:    Patient ID: Emily Wagner, female    DOB: [birth year], 79 y.o.   MRN: [id number]  HPI This patient presents for recheck of multiple medical problems.   Patient Active Problem List  Diagnosis  . POST-POLIO SYNDROME  . CARCINOMA, ENDOMETRIUM  . HYPERCHOLESTEROLEMIA  . HYPERTENSION  . CAD  . DIVERTICULOSIS OF COLON  . HEPATITIS, CHRONIC ACTIVE  . RENAL DISEASE, CHRONIC  . INTERSTITIAL CYSTITIS  . Renal insufficiency  . Other and unspecified hyperlipidemia  . Benign hypertensive heart disease without heart failure  . Adjustment disorder with mixed anxiety and depressed mood  . Other dyspnea and respiratory abnormality  . Hematuria  . Interstitial cystitis  . Osteopenia  . ASCVD (arteriosclerotic cardiovascular disease)    In addition, she complains of bilateral ankle edema..  The allergies, current medications, past medical history, surgical history, family and social history are reviewed.  Immunizations reviewed.  Health maintenance reviewed.  The following items are outstanding:none      Review of Systems  Constitutional: Negative.   HENT: Negative.   Eyes: Positive for itching (due to allergies).  Respiratory: Negative.   Cardiovascular: Positive for leg swelling (bilateral ankles).  Gastrointestinal: Positive for constipation.  Genitourinary: Positive for urgency and frequency (at night).  Musculoskeletal: Positive for back pain (sciatica) and arthralgias (R hip).  Allergic/Immunologic: Positive for environmental allergies (seasonal).  Neurological: Negative.   Psychiatric/Behavioral: Positive for sleep disturbance (due to urinary frequency).       Objective:   Physical Exam BP 142/84  Pulse 66  Temp(Src) 98.1 F (36.7 C)  Ht 5\' 3"  (1.6 m)  Wt 148 lb 11.2 oz (67.45 kg)  BMI 26.35 kg/m2  LMP 03/21/1971  The patient appeared well nourished and normally developed, alert and oriented to time and place. Speech, behavior and judgement  appear normal. Vital signs as documented.  Head exam is unremarkable. No scleral icterus or pallor noted. Nasal congestion bilaterally, right greater than left. TMs were normal.  Neck is without jugular venous distension, thyromegally, or carotid bruits. Carotid upstrokes are brisk bilaterally. No cervical adenopathy. Lungs are clear anteriorly and posteriorly to auscultation. Normal respiratory effort. Cardiac exam reveals regular rate and rhythm at 84 per minute. First and second heart sounds normal. No murmurs, rubs or gallops. Breasts exam was normal no lumps no axillary adenopathy  Abdominal exam reveals normal bowl sounds, no masses, no organomegaly and no aortic enlargement. No inguinal adenopathy. Extremities are nonedematous  today and both femoral and pedal pulses are normal. Skin without pallor or jaundice.  Warm and dry, without rash. Neurologic exam reveals normal deep tendon reflexes and normal sensation.          Assessment & Plan:  1. Edema - furosemide (LASIX) 20 MG tablet; Take 1 tablet (20 mg total) by mouth daily.  Dispense: 30 tablet; Refill: 3  2. HYPERTENSION - Basic metabolic panel  3. HYPERCHOLESTEROLEMIA - Lipid panel  4. ASCVD (arteriosclerotic cardiovascular disease) - Basic metabolic panel  5. Other malaise and fatigue - POCT CBC - Thyroid Panel With TSH  6. Unspecified vitamin D deficiency - Vitamin D 25 hydroxy  7. Encounter for long-term (current) use of other medication - Hepatic function panel  8. Urinary tract infection, site not specified - POCT urinalysis dipstick - POCT UA - Microscopic Only

## 2013-03-21 LAB — VITAMIN D 25 HYDROXY (VIT D DEFICIENCY, FRACTURES): Vit D, 25-Hydroxy: 48 ng/mL (ref 30–89)

## 2013-04-01 ENCOUNTER — Telehealth: Payer: Self-pay

## 2013-04-01 MED ORDER — LORAZEPAM 0.5 MG PO TABS: 0.5000 mg | ORAL_TABLET | Freq: Every day | ORAL | Status: DC

## 2013-04-01 NOTE — Telephone Encounter (Signed)
Last seen 03/20/13  Don't see in Epic when this was last filled or don't see in the chart  If approved call in RX and have nurse notify the patient

## 2013-04-01 NOTE — Telephone Encounter (Signed)
RX called into [facility]

## 2013-04-01 NOTE — Telephone Encounter (Signed)
Okay to refill for six-month

## 2013-04-08 ENCOUNTER — Other Ambulatory Visit (INDEPENDENT_AMBULATORY_CARE_PROVIDER_SITE_OTHER): Payer: Medicare Other

## 2013-04-08 DIAGNOSIS — Z1212 Encounter for screening for malignant neoplasm of rectum: Secondary | ICD-10-CM

## 2013-04-09 ENCOUNTER — Other Ambulatory Visit (INDEPENDENT_AMBULATORY_CARE_PROVIDER_SITE_OTHER): Payer: Medicare Other

## 2013-04-09 DIAGNOSIS — Z1212 Encounter for screening for malignant neoplasm of rectum: Secondary | ICD-10-CM

## 2013-04-09 NOTE — Progress Notes (Signed)
Patient dropped off fobt 

## 2013-04-28 ENCOUNTER — Telehealth: Payer: Self-pay | Admitting: Family Medicine

## 2013-04-28 NOTE — Telephone Encounter (Signed)
appt resch.

## 2013-06-02 ENCOUNTER — Telehealth: Payer: Self-pay | Admitting: Family Medicine

## 2013-06-16 ENCOUNTER — Encounter: Payer: Self-pay | Admitting: Gastroenterology

## 2013-06-20 ENCOUNTER — Telehealth: Payer: Self-pay | Admitting: Gastroenterology

## 2013-06-20 ENCOUNTER — Encounter: Payer: Self-pay | Admitting: Gastroenterology

## 2013-06-20 NOTE — Telephone Encounter (Signed)
Pt called to say that she got a recall letter in the mail for her colonoscopy. Pt states she is not having any problems and she just had stool cards done by her PCP. Pt states she is 79 years old and she does not want to do another colon at this time. States that if she starts to have any problems she will call back to schedule.

## 2013-07-15 ENCOUNTER — Ambulatory Visit (INDEPENDENT_AMBULATORY_CARE_PROVIDER_SITE_OTHER): Payer: Medicare Other | Admitting: Family Medicine

## 2013-07-15 ENCOUNTER — Encounter: Payer: Self-pay | Admitting: Family Medicine

## 2013-07-15 VITALS — BP 151/80 | HR 66 | Temp 97.0°F | Ht 63.0 in | Wt 154.2 lb

## 2013-07-15 DIAGNOSIS — E559 Vitamin D deficiency, unspecified: Secondary | ICD-10-CM

## 2013-07-15 DIAGNOSIS — Z888 Allergy status to other drugs, medicaments and biological substances status: Secondary | ICD-10-CM | POA: Insufficient documentation

## 2013-07-15 DIAGNOSIS — R5381 Other malaise: Secondary | ICD-10-CM

## 2013-07-15 DIAGNOSIS — I10 Essential (primary) hypertension: Secondary | ICD-10-CM

## 2013-07-15 DIAGNOSIS — I2510 Atherosclerotic heart disease of native coronary artery without angina pectoris: Secondary | ICD-10-CM

## 2013-07-15 DIAGNOSIS — J309 Allergic rhinitis, unspecified: Secondary | ICD-10-CM

## 2013-07-15 DIAGNOSIS — R5383 Other fatigue: Secondary | ICD-10-CM

## 2013-07-15 DIAGNOSIS — E785 Hyperlipidemia, unspecified: Secondary | ICD-10-CM

## 2013-07-15 DIAGNOSIS — I7090 Unspecified atherosclerosis: Secondary | ICD-10-CM

## 2013-07-15 LAB — POCT CBC
HCT, POC: 46.4 % (ref 37.7–47.9)
Hemoglobin: 15.5 g/dL (ref 12.2–16.2)
Lymph, poc: 1.2 (ref 0.6–3.4)
MCHC: 33.4 g/dL (ref 31.8–35.4)
MPV: 7.2 fL (ref 0–99.8)
POC Granulocyte: 3.8 (ref 2–6.9)
RBC: 5 M/uL (ref 4.04–5.48)
WBC: 5.3 10*3/uL (ref 4.6–10.2)

## 2013-07-15 MED ORDER — VERAPAMIL HCL 120 MG PO TABS: 180.0000 mg | ORAL_TABLET | Freq: Every day | ORAL | Status: DC

## 2013-07-15 MED ORDER — MELOXICAM 7.5 MG PO TABS: 7.5000 mg | ORAL_TABLET | Freq: Every day | ORAL | Status: DC

## 2013-07-15 MED ORDER — LEVOTHYROXINE SODIUM 50 MCG PO TABS: 50.0000 ug | ORAL_TABLET | Freq: Every day | ORAL | Status: DC

## 2013-07-15 NOTE — Progress Notes (Signed)
Subjective:    Patient ID: Emily Wagner, female    DOB: March 25, 1933, 79 y.o.   MRN: [id number]  HPI Patient is doing well and returns to clinic today for followup and management of chronic medical problems. These problems include hypertension, hypothyroidism, hyperlipidemia, and ASCVD. She also has stage II chronic kidney disease. Patient had a routine mammogram in June and it was recommended that she get a followup ultrasound and 3-D mammogram and according to patient this was negative. She also consulted with the gastroenterologist and he said that no further colonoscopies were necessary unless she developed further problems or had a positive stool for blood.   Review of Systems  Constitutional: Positive for fatigue.  HENT: Positive for congestion, sneezing and postnasal drip (slight). Negative for ear pain, sore throat and sinus pressure.   Eyes: Negative.  Negative for photophobia, pain, discharge, redness, itching and visual disturbance.  Respiratory: Positive for cough. Negative for choking (slight in the AM), chest tightness, shortness of breath and wheezing.   Cardiovascular: Negative.  Negative for chest pain and palpitations.  Gastrointestinal: Negative.  Negative for nausea, vomiting, abdominal pain, diarrhea, constipation and blood in stool.  Endocrine: Negative.  Negative for cold intolerance, heat intolerance, polydipsia and polyphagia.  Genitourinary: Negative.  Negative for dysuria, urgency, frequency, hematuria, vaginal bleeding, vaginal discharge and vaginal pain.  Musculoskeletal: Positive for myalgias. Negative for back pain and arthralgias. Joint swelling: R lower leg cramping.  Skin: Negative.  Negative for color change, pallor, rash and wound.  Allergic/Immunologic: Positive for environmental allergies (year around).  Neurological: Negative for dizziness, tremors, weakness, light-headedness, numbness and headaches.  Hematological: Negative.  Does not bruise/bleed  easily.  Psychiatric/Behavioral: Negative for confusion, sleep disturbance, decreased concentration and agitation. The patient is nervous/anxious (anxiety).        Objective:   Physical Exam  BP 151/80  Pulse 66  Temp(Src) 97 F (36.1 C) (Oral)  Ht 5\' 3"  (1.6 m)  Wt 154 lb 3.2 oz (69.945 kg)  BMI 27.32 kg/m2  LMP 03/21/1971  The patient appeared well nourished and normally developed for her age, alert and oriented to time and place. Speech, behavior and judgement appear normal. Vital signs as documented.  Head exam is unremarkable. No scleral icterus or pallor noted. Mouth and throat and ear canals were normal. There was bilateral nasal congestion . Neck is without jugular venous distension, thyromegally, or carotid bruits. Carotid upstrokes are brisk bilaterally. No cervical adenopathy. Lungs are clear anteriorly and posteriorly to auscultation. Normal respiratory effort. Cardiac exam reveals regular rate and rhythm at 72 per minute. First and second heart sounds normal.  No murmurs, rubs or gallops.  Abdominal exam reveals normal bowl sounds, no masses, no organomegaly and no aortic enlargement. No inguinal adenopathy. There is no abdominal tenderness. Extremities are nonedematous and both femoral and pedal pulses are normal. Skin without pallor or jaundice.  Warm and dry, without rash. Neurologic exam reveals normal deep tendon reflexes and normal sensation.          Assessment & Plan:  1. Hypertension - BMP8+EGFR  2. Hyperlipemia - Hepatic function panel; Standing - NMR, lipoprofile; Standing - Hepatic function panel - NMR, lipoprofile  3. Fatigue - POCT CBC; Standing - BMP8+EGFR - Thyroid Panel With TSH - POCT CBC  4. ASCVD (arteriosclerotic cardiovascular disease)  5. Vitamin D deficiency - Vit D25 hydroxy(osteoporosis monitoring); Standing - Vit D25 hydroxy(osteoporosis monitoring)  6. Allergy to multiple medications  7. Allergic rhinitis,  perennial  Patient  Instructions  Fall precautions discussed Continue current meds and therapeutic lifestyle changes Return to clinic in September or October for a flu shot   Nyra Capes MD

## 2013-07-15 NOTE — Patient Instructions (Addendum)
Fall precautions discussed Continue current meds and therapeutic lifestyle changes Return to clinic in September or October for a flu shot 

## 2013-07-15 NOTE — Addendum Note (Signed)
Addended by: Bearl Mulberry on: 07/15/2013 09:09 AM   Modules accepted: Orders, Medications

## 2013-07-16 ENCOUNTER — Ambulatory Visit: Payer: Medicare Other | Admitting: Family Medicine

## 2013-07-17 ENCOUNTER — Ambulatory Visit: Payer: Medicare Other | Admitting: Family Medicine

## 2013-07-17 LAB — HEPATIC FUNCTION PANEL
Alkaline Phosphatase: 113 IU/L (ref 39–117)
Bilirubin, Direct: 0.12 mg/dL (ref 0.00–0.40)
Total Bilirubin: 0.5 mg/dL (ref 0.0–1.2)
Total Protein: 7.4 g/dL (ref 6.0–8.5)

## 2013-07-17 LAB — BMP8+EGFR
BUN: 20 mg/dL (ref 8–27)
Creatinine, Ser: 1.03 mg/dL — ABNORMAL HIGH (ref 0.57–1.00)
GFR calc Af Amer: 60 mL/min/{1.73_m2} (ref >59)
GFR calc non Af Amer: 52 mL/min/{1.73_m2} — ABNORMAL LOW (ref >59)
Glucose: 86 mg/dL (ref 65–99)

## 2013-07-17 LAB — NMR, LIPOPROFILE
Cholesterol: 214 mg/dL — ABNORMAL HIGH (ref <200)
HDL Particle Number: 25.5 umol/L — ABNORMAL LOW (ref >=30.5)
LDL Particle Number: 1670 nmol/L — ABNORMAL HIGH (ref <1000)
LDL Size: 21.3 nm (ref >20.5)
LP-IR Score: <25 (ref <=45)
Small LDL Particle Number: 550 nmol/L — ABNORMAL HIGH (ref <=527)
Triglycerides by NMR: 104 mg/dL (ref <150)

## 2013-07-17 LAB — THYROID PANEL WITH TSH
T3 Uptake Ratio: 30 % (ref 24–39)
T4, Total: 7.3 ug/dL (ref 4.5–12.0)
TSH: 2.65 u[IU]/mL (ref 0.450–4.500)

## 2013-08-27 ENCOUNTER — Encounter: Payer: Self-pay | Admitting: Gastroenterology

## 2013-09-23 ENCOUNTER — Ambulatory Visit (INDEPENDENT_AMBULATORY_CARE_PROVIDER_SITE_OTHER): Payer: Medicare Other

## 2013-09-23 DIAGNOSIS — Z23 Encounter for immunization: Secondary | ICD-10-CM

## 2013-09-30 ENCOUNTER — Telehealth: Payer: Self-pay | Admitting: Family Medicine

## 2013-09-30 NOTE — Telephone Encounter (Signed)
This prescription may be refill for her six-month

## 2013-10-01 MED ORDER — LORAZEPAM 0.5 MG PO TABS: 0.5000 mg | ORAL_TABLET | Freq: Every day | ORAL | Status: DC

## 2013-10-02 ENCOUNTER — Other Ambulatory Visit: Payer: Self-pay | Admitting: *Deleted

## 2013-10-02 MED ORDER — LORAZEPAM 0.5 MG PO TABS: 0.5000 mg | ORAL_TABLET | Freq: Two times a day (BID) | ORAL | Status: DC

## 2013-10-16 ENCOUNTER — Ambulatory Visit (INDEPENDENT_AMBULATORY_CARE_PROVIDER_SITE_OTHER): Payer: Medicare Other

## 2013-10-16 ENCOUNTER — Encounter: Payer: Self-pay | Admitting: Family Medicine

## 2013-10-16 ENCOUNTER — Encounter (INDEPENDENT_AMBULATORY_CARE_PROVIDER_SITE_OTHER): Payer: Self-pay

## 2013-10-16 ENCOUNTER — Ambulatory Visit (INDEPENDENT_AMBULATORY_CARE_PROVIDER_SITE_OTHER): Payer: Medicare Other | Admitting: Family Medicine

## 2013-10-16 VITALS — BP 141/92 | HR 77 | Temp 98.9°F | Ht 63.0 in | Wt 158.0 lb

## 2013-10-16 DIAGNOSIS — Z780 Asymptomatic menopausal state: Secondary | ICD-10-CM

## 2013-10-16 DIAGNOSIS — I7090 Unspecified atherosclerosis: Secondary | ICD-10-CM

## 2013-10-16 DIAGNOSIS — I10 Essential (primary) hypertension: Secondary | ICD-10-CM

## 2013-10-16 DIAGNOSIS — L570 Actinic keratosis: Secondary | ICD-10-CM

## 2013-10-16 DIAGNOSIS — I2510 Atherosclerotic heart disease of native coronary artery without angina pectoris: Secondary | ICD-10-CM

## 2013-10-16 DIAGNOSIS — Z23 Encounter for immunization: Secondary | ICD-10-CM

## 2013-10-16 DIAGNOSIS — E7800 Pure hypercholesterolemia, unspecified: Secondary | ICD-10-CM

## 2013-10-16 DIAGNOSIS — M899 Disorder of bone, unspecified: Secondary | ICD-10-CM

## 2013-10-16 DIAGNOSIS — E559 Vitamin D deficiency, unspecified: Secondary | ICD-10-CM

## 2013-10-16 DIAGNOSIS — M8580 Other specified disorders of bone density and structure, unspecified site: Secondary | ICD-10-CM

## 2013-10-16 DIAGNOSIS — F4323 Adjustment disorder with mixed anxiety and depressed mood: Secondary | ICD-10-CM

## 2013-10-16 NOTE — Patient Instructions (Addendum)
Continue current medications. Continue good therapeutic lifestyle changes which include good diet and exercise. Fall precautions discussed with patient. If you are over 77 years old - you may need Prevnar 13 or the adult Pneumonia vaccine. The Prevnar may cause some residual soreness at the site of injection We will call you with the results of the lab work and a chest x-ray once those results are available This winter drink plenty of fluids and if possible use a cool mist humidifier in her bedroom at nighttime

## 2013-10-16 NOTE — Progress Notes (Signed)
Subjective:    Patient ID: Emily Wagner, female    DOB: 25-Jan-1933, 79 y.o.   MRN: [id number]  HPI Pt here for follow up and management of chronic medical problems. The patient has been doing well except she has a lot of stress dealing with her daughter who's had a stroke and has diabetes. She is very tearful about this today. She brings her home blood pressures for review and they are all good. She also has 2 skin lesions which he wants to have cryotherapy on. One on her right forehead and one near her left temple. She will get a chest x-ray did any pain she will come back and get a DEXA scan in January. The orders for her labs are in. And she will get a Prevnar shot today.     Patient Active Problem List   Diagnosis Date Noted  . Allergy to multiple medications 07/15/2013  . Renal insufficiency   . Adjustment disorder with mixed anxiety and depressed mood   . Osteopenia   . ASCVD (arteriosclerotic cardiovascular disease)   . 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  . HYPERCHOLESTEROLEMIA 05/02/2008  . HYPERTENSION 05/02/2008  . INTERSTITIAL CYSTITIS 05/02/2008  . DIVERTICULOSIS OF COLON 08/27/2003   Outpatient Encounter Prescriptions as of 10/16/2013  Medication Sig  . aspirin EC 81 MG tablet Take 81 mg by mouth daily.  . Cholecalciferol (VITAMIN D) 2000 UNITS CAPS Take 1 capsule by mouth daily.  . diphenhydrAMINE (BENADRYL) 25 MG tablet Take 25 mg by mouth every 6 (six) hours as needed for itching.  . levothyroxine (SYNTHROID, LEVOTHROID) 50 MCG tablet Take 1 tablet (50 mcg total) by mouth daily.  Marland Kitchen LORazepam (ATIVAN) 0.5 MG tablet Take 1 tablet (0.5 mg total) by mouth 2 (two) times daily.  . meloxicam (MOBIC) 7.5 MG tablet Take 1 tablet (7.5 mg total) by mouth daily. As needed  . Multiple Vitamins-Minerals (CENTRUM SILVER) tablet Take 1 tablet by mouth daily.    . verapamil (CALAN) 120 MG tablet Take 1.5 tablets (180 mg total) by mouth daily. 1 I/2 tab bid  . vitamin C (ASCORBIC  ACID) 500 MG tablet Take 500 mg by mouth 3 (three) times daily.  . [DISCONTINUED] ciprofloxacin (CIPRO) 500 MG tablet Take 500 mg by mouth 2 (two) times daily. As needed     Review of Systems  Constitutional: Negative.   HENT: Negative.   Eyes: Negative.   Respiratory: Negative.   Cardiovascular: Negative.   Gastrointestinal: Negative.   Endocrine: Negative.   Genitourinary: Negative.   Musculoskeletal: Negative.   Skin: Negative.   Allergic/Immunologic: Negative.   Neurological: Negative.   Hematological: Negative.   Psychiatric/Behavioral: The patient is nervous/anxious (increased stress - due to daughter's condition).        Objective:   Physical Exam  Nursing note and vitals reviewed. Constitutional: She is oriented to person, place, and time. She appears well-developed and well-nourished. She appears distressed (Worrying  about her daughter.).  HENT:  Head: Normocephalic and atraumatic.  Right Ear: External ear normal.  Left Ear: External ear normal.  Nose: Nose normal.  Mouth/Throat: Oropharynx is clear and moist. No oropharyngeal exudate.  Eyes: Conjunctivae and EOM are normal. Pupils are equal, round, and reactive to light. Right eye exhibits no discharge. Left eye exhibits no discharge. No scleral icterus.  Neck: Normal range of motion. Neck supple. No thyromegaly present.  Cardiovascular: Normal rate, regular rhythm, normal heart sounds and intact distal pulses.  Exam reveals no gallop and no friction rub.  No murmur heard. At 72 per minute  Pulmonary/Chest: Effort normal and breath sounds normal. No respiratory distress. She has no wheezes. She has no rales. She exhibits no tenderness.  Abdominal: Soft. Bowel sounds are normal. She exhibits no distension and no mass. There is no tenderness. There is no rebound and no guarding.  Musculoskeletal: Normal range of motion. She exhibits no edema and no tenderness.  Lymphadenopathy:    She has no cervical adenopathy.   Neurological: She is alert and oriented to person, place, and time. She has normal reflexes. No cranial nerve deficit.  Skin: Skin is warm and dry.  2 irritated actinic keratoses. 1 on her right for head at her hairline. 1 at her left temple   Psychiatric: Her behavior is normal. Judgment and thought content normal.  Tearful and crying   BP 161/94  Pulse 79  Temp(Src) 98.9 F (37.2 C) (Oral)  Ht 5\' 3"  (1.6 m)  LMP 03/21/1971 Cryotherapy was done on 2 facial lesions 1 at right forehead and 1 at the left temple without any problems, patient tolerated procedure well       Assessment & Plan:    1. HYPERTENSION   2. ASCVD (arteriosclerotic cardiovascular disease)   3. HYPERCHOLESTEROLEMIA   4. Osteopenia   5. Postmenopausal   6. Need for prophylactic vaccination against Streptococcus pneumoniae (pneumococcus)   7. Vitamin D deficiency   8. Adjustment disorder with mixed anxiety and depressed mood   9. Multiple actinic keratoses    Orders Placed This Encounter  Procedures  . DG Chest 2 View    Standing Status: Future     Number of Occurrences: 1     Standing Expiration Date: 12/16/2014    Order Specific Question:  Reason for Exam (SYMPTOM  OR DIAGNOSIS REQUIRED)    Answer:  htn    Order Specific Question:  Preferred imaging location?    Answer:  Internal  . DG Bone Density    Standing Status: Future     Number of Occurrences:      Standing Expiration Date: 12/16/2014    Order Specific Question:  Reason for Exam (SYMPTOM  OR DIAGNOSIS REQUIRED)    Answer:  postmenapausal    Order Specific Question:  Preferred imaging location?    Answer:  Internal  . Pneumococcal conjugate vaccine 13-valent  . Hepatic function panel    Standing Status: Future     Number of Occurrences:      Standing Expiration Date: 10/16/2014  . BMP8+EGFR    Standing Status: Future     Number of Occurrences:      Standing Expiration Date: 10/16/2014  . NMR, lipoprofile    Standing Status: Future      Number of Occurrences:      Standing Expiration Date: 10/16/2014  . Vit D  25 hydroxy (rtn osteoporosis monitoring)    Standing Status: Future     Number of Occurrences:      Standing Expiration Date: 10/16/2014  . POCT CBC    Standing Status: Future     Number of Occurrences:      Standing Expiration Date: 12/16/2013   No orders of the defined types were placed in this encounter.   Patient Instructions  Continue current medications. Continue good therapeutic lifestyle changes which include good diet and exercise. Fall precautions discussed with patient. If you are over 1 years old - you may need Prevnar 13 or the adult Pneumonia vaccine. The Prevnar may cause some residual soreness at the  site of injection We will call you with the results of the lab work and a chest x-ray once those results are available This winter drink plenty of fluids and if possible use a cool mist humidifier in her bedroom at nighttime    Nyra Capes MD

## 2013-11-13 ENCOUNTER — Other Ambulatory Visit (INDEPENDENT_AMBULATORY_CARE_PROVIDER_SITE_OTHER): Payer: Medicare Other

## 2013-11-13 DIAGNOSIS — Z780 Asymptomatic menopausal state: Secondary | ICD-10-CM

## 2013-11-13 DIAGNOSIS — M8580 Other specified disorders of bone density and structure, unspecified site: Secondary | ICD-10-CM

## 2013-11-13 DIAGNOSIS — I2510 Atherosclerotic heart disease of native coronary artery without angina pectoris: Secondary | ICD-10-CM

## 2013-11-13 DIAGNOSIS — E559 Vitamin D deficiency, unspecified: Secondary | ICD-10-CM

## 2013-11-13 DIAGNOSIS — I10 Essential (primary) hypertension: Secondary | ICD-10-CM

## 2013-11-13 DIAGNOSIS — M899 Disorder of bone, unspecified: Secondary | ICD-10-CM

## 2013-11-13 DIAGNOSIS — E7800 Pure hypercholesterolemia, unspecified: Secondary | ICD-10-CM

## 2013-11-13 LAB — POCT CBC
Granulocyte percent: 69.7 %G (ref 37–80)
Lymph, poc: 1.2 (ref 0.6–3.4)
MCH, POC: 30.6 pg (ref 27–31.2)
MCHC: 32.9 g/dL (ref 31.8–35.4)
MCV: 92.9 fL (ref 80–97)
MPV: 8.8 fL (ref 0–99.8)
POC LYMPH PERCENT: 25 %L (ref 10–50)
Platelet Count, POC: 181 10*3/uL (ref 142–424)
RBC: 5.1 M/uL (ref 4.04–5.48)
RDW, POC: 12.9 %
WBC: 5 10*3/uL (ref 4.6–10.2)

## 2013-11-13 NOTE — Progress Notes (Signed)
Pt came in for labs only 

## 2013-11-15 LAB — HEPATIC FUNCTION PANEL
ALT: 13 IU/L (ref 0–32)
AST: 22 IU/L (ref 0–40)

## 2013-11-15 LAB — NMR, LIPOPROFILE
HDL Cholesterol by NMR: 43 mg/dL (ref >=40)
HDL Particle Number: 26.6 umol/L — ABNORMAL LOW (ref >=30.5)
LDL Particle Number: 1762 nmol/L — ABNORMAL HIGH (ref <1000)
LDLC SERPL CALC-MCNC: 146 mg/dL — ABNORMAL HIGH (ref <100)
Triglycerides by NMR: 113 mg/dL (ref <150)

## 2013-11-15 LAB — BMP8+EGFR
BUN: 22 mg/dL (ref 8–27)
CO2: 22 mmol/L (ref 18–29)
Calcium: 9.4 mg/dL (ref 8.6–10.2)
Creatinine, Ser: 1.1 mg/dL — ABNORMAL HIGH (ref 0.57–1.00)
GFR calc non Af Amer: 48 mL/min/{1.73_m2} — ABNORMAL LOW (ref >59)
Glucose: 89 mg/dL (ref 65–99)

## 2013-11-15 LAB — VITAMIN D 25 HYDROXY (VIT D DEFICIENCY, FRACTURES): Vit D, 25-Hydroxy: 40.9 ng/mL (ref 30.0–100.0)

## 2013-11-25 ENCOUNTER — Telehealth: Payer: Self-pay | Admitting: Family Medicine

## 2013-11-25 NOTE — Telephone Encounter (Signed)
Message copied by Azalee Course on Tue Nov 25, 2013  3:21 PM ------      Message from: [name]      Created: Sat Nov 15, 2013  9:38 PM       All liver function tests are within normal limit      The blood sugar is 89. The creatinine is slightly elevated at 1.10 and this is slightly higher than it was 4 months ago. The remainder of the electrolytes are within normal limits. Have patient repeat her BMP in 4 week+++++++++      On advanced lipid testing a total LDL particle number remains elevated at 1762. 4 months ago it was 1670. The LDL C. was 146 admission be less than 100. Triglycerides were good at 113. The good cholesterol the HDL particle number was low.------- the patient is staph intolerant and therefore can only improve or lower her cholesterol by adhering to a more aggressive diet and getting more exercise, please emphasizes.      The vitamin D level was within normal limits and consistent with past readings. ------

## 2013-12-15 ENCOUNTER — Encounter: Payer: Self-pay | Admitting: *Deleted

## 2013-12-17 ENCOUNTER — Ambulatory Visit (INDEPENDENT_AMBULATORY_CARE_PROVIDER_SITE_OTHER): Payer: Medicare Other | Admitting: Pharmacist

## 2013-12-17 ENCOUNTER — Ambulatory Visit (INDEPENDENT_AMBULATORY_CARE_PROVIDER_SITE_OTHER): Payer: Medicare Other

## 2013-12-17 ENCOUNTER — Encounter: Payer: Self-pay | Admitting: Pharmacist

## 2013-12-17 VITALS — Ht 62.75 in | Wt 158.0 lb

## 2013-12-17 DIAGNOSIS — M810 Age-related osteoporosis without current pathological fracture: Secondary | ICD-10-CM

## 2013-12-17 DIAGNOSIS — Z780 Asymptomatic menopausal state: Secondary | ICD-10-CM

## 2013-12-17 DIAGNOSIS — M8580 Other specified disorders of bone density and structure, unspecified site: Secondary | ICD-10-CM

## 2013-12-17 NOTE — Patient Instructions (Signed)

## 2013-12-17 NOTE — Progress Notes (Signed)
Patient ID: Emily Wagner, female   DOB: 30-Oct-1933, 80 y.o.   MRN: [id number]  [facility] Current Height: Height: 5' 2.75" (159.4 cm)      Max Lifetime Height:  5\' 4"  Current Weight: Weight: 158 lb (71.668 kg)       Ethnicity:Caucasian    HPI: Does pt already have a diagnosis of:  Osteopenia?  Yes  Back Pain?  No       Kyphosis?  No Prior fracture?  Yes - hip fracture 2012  Med(s) for Osteoporosis/Osteopenia:  none Med(s) previously tried for Osteoporosis/Osteopenia:  Fosamax - joint and bone pain              Has refused medications for osteoporosis in the past.                                               PMH: Age at menopause:  80 yo - had total hyster. Hysterectomy?  Yes Oophorectomy?  Yes HRT? Yes - Former.  Type/duration: estrogen Steroid Use?  No Thyroid med?  Yes History of cancer?  No History of digestive disorders (ie Crohn's)?  No Current or previous eating disorders?  No Last Vitamin D Result:  40.9 (11/13/2013) Last GFR Result:  48 (11/13/2013)   FH/SH: Family history of osteoporosis?  Yes - sister Parent with history of hip fracture?  No Family history of breast cancer?  No Exercise?  No Smoking?  No Alcohol?  No    Calcium Assessment Calcium Intake  # of servings/day  Calcium mg  Milk (8 oz) 0  x  300  = 0  Yogurt (4 oz) 0 x  200 = 0  Cheese (1 oz) 1 x  200 = 200mg   Other Calcium sources   250mg   Ca supplement 600mg  daily = 600mg    Estimated calcium intake per day 1050mg     DEXA Results Date of Test T-Score for AP Spine L1-L4 T-Score for Total Left Hip T-Score for Total Right Hip  12/17/2013 -0.2 -2.4 --  12/20/2011 -0.3 -2.4 --  12/15/2009 -0.2 -2.2 --  10/14/2007 0.1 -2.2 --   Neck of left hip T-Score is -2.5  Assessment: Osteoporosis with history of hip fracture but patient refuses to take medication for bones  Recommendations: 1.  Discussed fracture risk and treatment options.  Patient continues to refuse treatement but  was given information about Evista and she will consider. 2.  recommend calcium 1200mg  daily through supplementation or diet.  3.  recommend weight bearing exercise - 30 minutes at least 4 days  per week.   4.  Counseled and educated about fall risk and prevention.  Recheck DEXA:  2 years  Time spent counseling patient:  20 minutes  Cherre Robins, PharmD, CPP

## 2014-01-08 ENCOUNTER — Encounter: Payer: Self-pay | Admitting: *Deleted

## 2014-01-15 ENCOUNTER — Other Ambulatory Visit: Payer: Self-pay

## 2014-01-15 MED ORDER — LORAZEPAM 0.5 MG PO TABS: 0.5000 mg | ORAL_TABLET | Freq: Two times a day (BID) | ORAL | Status: DC

## 2014-01-15 MED ORDER — LEVOTHYROXINE SODIUM 50 MCG PO TABS: 50.0000 ug | ORAL_TABLET | Freq: Every day | ORAL | Status: DC

## 2014-01-15 NOTE — Telephone Encounter (Signed)
Last seen 10/16/13  DWM  Last thyroid labs 07/14/13  This is for mail order

## 2014-01-26 ENCOUNTER — Encounter: Payer: Self-pay | Admitting: *Deleted

## 2014-03-03 ENCOUNTER — Ambulatory Visit: Payer: Medicare Other | Admitting: Family Medicine

## 2014-03-05 ENCOUNTER — Ambulatory Visit: Payer: Medicare Other | Admitting: Family Medicine

## 2014-03-16 ENCOUNTER — Ambulatory Visit: Payer: Medicare Other | Admitting: Family Medicine

## 2014-03-18 ENCOUNTER — Ambulatory Visit: Payer: Medicare Other | Admitting: Family Medicine

## 2014-05-14 ENCOUNTER — Encounter: Payer: Self-pay | Admitting: Family Medicine

## 2014-05-14 ENCOUNTER — Ambulatory Visit (INDEPENDENT_AMBULATORY_CARE_PROVIDER_SITE_OTHER): Payer: Medicare Other | Admitting: Family Medicine

## 2014-05-14 VITALS — BP 135/90 | HR 79 | Temp 95.8°F | Ht 62.75 in | Wt 159.0 lb

## 2014-05-14 DIAGNOSIS — I7090 Unspecified atherosclerosis: Secondary | ICD-10-CM

## 2014-05-14 DIAGNOSIS — I2510 Atherosclerotic heart disease of native coronary artery without angina pectoris: Secondary | ICD-10-CM

## 2014-05-14 DIAGNOSIS — E559 Vitamin D deficiency, unspecified: Secondary | ICD-10-CM

## 2014-05-14 DIAGNOSIS — E7800 Pure hypercholesterolemia, unspecified: Secondary | ICD-10-CM

## 2014-05-14 DIAGNOSIS — Z23 Encounter for immunization: Secondary | ICD-10-CM

## 2014-05-14 DIAGNOSIS — I10 Essential (primary) hypertension: Secondary | ICD-10-CM

## 2014-05-14 DIAGNOSIS — H8309 Labyrinthitis, unspecified ear: Secondary | ICD-10-CM

## 2014-05-14 LAB — POCT CBC
Granulocyte percent: 67.9 %G (ref 37–80)
HCT, POC: 46.9 % (ref 37.7–47.9)
Hemoglobin: 15.1 g/dL (ref 12.2–16.2)
Lymph, poc: 1.5 (ref 0.6–3.4)
MCH, POC: 30.2 pg (ref 27–31.2)
MCHC: 32.3 g/dL (ref 31.8–35.4)
MCV: 93.4 fL (ref 80–97)
MPV: 7.7 fL (ref 0–99.8)
PLATELET COUNT, POC: 211 10*3/uL (ref 142–424)
POC GRANULOCYTE: 3.9 (ref 2–6.9)
POC LYMPH %: 26 % (ref 10–50)
RBC: 5 M/uL (ref 4.04–5.48)
RDW, POC: 12.8 %
WBC: 5.8 10*3/uL (ref 4.6–10.2)

## 2014-05-14 MED ORDER — LORAZEPAM 0.5 MG PO TABS: 0.5000 mg | ORAL_TABLET | Freq: Two times a day (BID) | ORAL | Status: DC

## 2014-05-14 MED ORDER — VERAPAMIL HCL 120 MG PO TABS: 180.0000 mg | ORAL_TABLET | Freq: Every day | ORAL | Status: DC

## 2014-05-14 MED ORDER — MECLIZINE HCL 12.5 MG PO TABS: 12.5000 mg | ORAL_TABLET | Freq: Two times a day (BID) | ORAL | Status: DC | PRN

## 2014-05-14 MED ORDER — CIPROFLOXACIN HCL 500 MG PO TABS: 500.0000 mg | ORAL_TABLET | Freq: Every day | ORAL | Status: DC

## 2014-05-14 MED ORDER — LEVOTHYROXINE SODIUM 50 MCG PO TABS: 50.0000 ug | ORAL_TABLET | Freq: Every day | ORAL | Status: DC

## 2014-05-14 NOTE — Progress Notes (Addendum)
 Subjective:    Patient ID: Emily Wagner, female    DOB: 02/09/1933, 80 y.o.   MRN: [id number]  HPI Pt here for follow up and management of chronic medical problems. The patient had some lightheadedness this past week and attributes it to the new medication that she may been started on from the ophthalmologist. The patient is going to reduce the vitamin by mouth bid the ophthalmologist gave her to one daily. She would also like to have prescription for and are here called in for her to the drug store. Home blood pressure readings were reviewed which the patient brought into the visit today. All these readings were good.       Patient Active Problem List   Diagnosis Date Noted  . Allergy to multiple medications 07/15/2013  . Renal insufficiency   . Adjustment disorder with mixed anxiety and depressed mood   . Osteoporosis   . ASCVD (arteriosclerotic cardiovascular disease)   . 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  . HYPERCHOLESTEROLEMIA 05/02/2008  . HYPERTENSION 05/02/2008  . INTERSTITIAL CYSTITIS 05/02/2008  . DIVERTICULOSIS OF COLON 08/27/2003   Outpatient Encounter Prescriptions as of 05/14/2014  Medication Sig  . aspirin EC 81 MG tablet Take 81 mg by mouth daily.  . calcium carbonate (OS-CAL) 600 MG TABS tablet Take 600 mg by mouth daily with breakfast.  . Cholecalciferol (VITAMIN D) 2000 UNITS CAPS Take 1 capsule by mouth daily.  . levothyroxine (SYNTHROID, LEVOTHROID) 50 MCG tablet Take 1 tablet (50 mcg total) by mouth daily.  . LORazepam (ATIVAN) 0.5 MG tablet Take 1 tablet (0.5 mg total) by mouth 2 (two) times daily.  . verapamil (CALAN) 120 MG tablet Take 1.5 tablets (180 mg total) by mouth daily. 1 I/2 tab bid  . vitamin C (ASCORBIC ACID) 500 MG tablet Take 500 mg by mouth 3 (three) times daily.  . [DISCONTINUED] meloxicam (MOBIC) 7.5 MG tablet Take 1 tablet (7.5 mg total) by mouth daily. As needed    Review of Systems  Constitutional: Negative.   HENT: Negative.     Eyes: Negative.   Respiratory: Negative.   Cardiovascular: Negative.   Gastrointestinal: Negative.   Endocrine: Negative.   Genitourinary: Negative.   Musculoskeletal: Negative.   Skin: Negative.   Allergic/Immunologic: Negative.   Neurological: Positive for light-headedness (last week- on a new vitamin from Dr Groat).  Hematological: Negative.   Psychiatric/Behavioral: Negative.        Objective:   Physical Exam  Nursing note and vitals reviewed. Constitutional: She is oriented to person, place, and time. She appears well-developed and well-nourished. She appears distressed (DT the illness of her daughter at home with insulin-dependent diabetes and frequent episodes of).  HENT:  Head: Normocephalic and atraumatic.  Right Ear: External ear normal.  Left Ear: External ear normal.  Mouth/Throat: Oropharynx is clear and moist.  Nasal congestion bilaterally  Eyes: Conjunctivae and EOM are normal. Pupils are equal, round, and reactive to light. Right eye exhibits no discharge. Left eye exhibits no discharge. No scleral icterus.  Minimal air cerumen bilaterally  Neck: Normal range of motion. Neck supple. No thyromegaly present.  Cardiovascular: Normal rate, regular rhythm, normal heart sounds and intact distal pulses.  Exam reveals no gallop and no friction rub.   No murmur heard. At 72 per minute  Pulmonary/Chest: Effort normal and breath sounds normal. No respiratory distress. She has no wheezes. She has no rales. She exhibits no tenderness.  Abdominal: Soft. Bowel sounds are normal. She exhibits no mass. There   is no tenderness. There is no rebound and no guarding.  Musculoskeletal: Normal range of motion. She exhibits no edema and no tenderness.  Lymphadenopathy:    She has no cervical adenopathy.  Neurological: She is alert and oriented to person, place, and time. She has normal reflexes.  Skin: Skin is warm and dry. No rash noted.  Psychiatric: She has a normal mood and affect.  Her behavior is normal. Judgment and thought content normal.   BP 135/90  Pulse 79  Temp(Src) 95.8 F (35.4 C) (Oral)  Ht 5' 2.75" (1.594 m)  Wt 159 lb (72.122 kg)  BMI 28.39 kg/m2  LMP 03/21/1971        Assessment & Plan:  1. ASCVD (arteriosclerotic cardiovascular disease) - POCT CBC  2. HYPERCHOLESTEROLEMIA - POCT CBC - NMR, lipoprofile  3. HYPERTENSION - POCT CBC - BMP8+EGFR - Hepatic function panel  4. Vitamin D deficiency - Vit D  25 hydroxy (rtn osteoporosis monitoring)  5. Labyrinthitis  Meds ordered this encounter  Medications  . Multiple Vitamins-Minerals (PRESERVISION AREDS 2) CAPS    Sig: Take 1 capsule by mouth daily.  . ciprofloxacin (CIPRO) 500 MG tablet    Sig: Take 1 tablet (500 mg total) by mouth daily. As directed    Dispense:  90 tablet    Refill:  3  . LORazepam (ATIVAN) 0.5 MG tablet    Sig: Take 1 tablet (0.5 mg total) by mouth 2 (two) times daily.    Dispense:  180 tablet    Refill:  3  . levothyroxine (SYNTHROID, LEVOTHROID) 50 MCG tablet    Sig: Take 1 tablet (50 mcg total) by mouth daily.    Dispense:  90 tablet    Refill:  3  . verapamil (CALAN) 120 MG tablet    Sig: Take 1.5 tablets (180 mg total) by mouth daily. 1 I/2 tab bid    Dispense:  225 tablet    Refill:  3  . meclizine (ANTIVERT) 12.5 MG tablet    Sig: Take 1 tablet (12.5 mg total) by mouth 2 (two) times daily as needed for dizziness.    Dispense:  30 tablet    Refill:  1   Patient Instructions                       Medicare Annual Wellness Visit  [facility] and the medical providers at [facility] strive to bring you the best medical care.  In doing so we not only want to address your current medical conditions and concerns but also to detect new conditions early and prevent illness, disease and health-related problems.    Medicare offers a yearly Wellness Visit which allows our clinical staff to assess your need for preventative services  including immunizations, lifestyle education, counseling to decrease risk of preventable diseases and screening for fall risk and other medical concerns.    This visit is provided free of charge (no copay) for all Medicare recipients. The clinical pharmacists at [facility] have begun to conduct these Wellness Visits which will also include a thorough review of all your medications.    As you primary medical provider recommend that you make an appointment for your Annual Wellness Visit if you have not done so already this year.  You may set up this appointment before you leave today or you may call back ([phone]) and schedule an appointment.  Please make sure when you call that you mention that you  are scheduling your Annual Wellness Visit with the clinical pharmacist so that the appointment may be made for the proper length of time.     Continue current medications. Continue good therapeutic lifestyle changes which include good diet and exercise. Fall precautions discussed with patient. If an FOBT was given today- please return it to our front desk. If you are over 50 years old - you may need Prevnar 13 or the adult Pneumonia vaccine.  Drink plenty of fluids especially this summer with all of the heat   Don W. Moore MD    

## 2014-05-14 NOTE — Patient Instructions (Addendum)
Medicare Annual Wellness Visit  [facility] and the medical providers at [facility] strive to bring you the best medical care.  In doing so we not only want to address your current medical conditions and concerns but also to detect new conditions early and prevent illness, disease and health-related problems.    Medicare offers a yearly Wellness Visit which allows our clinical staff to assess your need for preventative services including immunizations, lifestyle education, counseling to decrease risk of preventable diseases and screening for fall risk and other medical concerns.    This visit is provided free of charge (no copay) for all Medicare recipients. The clinical pharmacists at [facility] have begun to conduct these Wellness Visits which will also include a thorough review of all your medications.    As you primary medical [facility] recommend that you make an appointment for your Annual Wellness Visit if you have not done so already this year.  You may set up this appointment before you leave today or you may call back ([phone]) and schedule an appointment.  Please make sure when you call that you mention that you are scheduling your Annual Wellness Visit with the clinical pharmacist so that the appointment may be made for the proper length of time.     Continue current medications. Continue good therapeutic lifestyle changes which include good diet and exercise. Fall precautions discussed with patient. If an FOBT was given today- please return it to our front desk. If you are over 86 years old - you may need Prevnar 74 or the adult Pneumonia vaccine.  Drink plenty of fluids especially this summer with all of the heat

## 2014-05-14 NOTE — Addendum Note (Signed)
Addended by: Zannie Cove on: 05/14/2014 12:16 PM   Modules accepted: Orders

## 2014-05-15 LAB — NMR, LIPOPROFILE
Cholesterol: 233 mg/dL — ABNORMAL HIGH (ref 100–199)
HDL CHOLESTEROL BY NMR: 45 mg/dL (ref >39)
HDL PARTICLE NUMBER: 26.1 umol/L — AB (ref >=30.5)
LDL Particle Number: 1911 nmol/L — ABNORMAL HIGH (ref <1000)
LDL Size: 21.2 nm (ref >20.5)
LDLC SERPL CALC-MCNC: 163 mg/dL — AB (ref 0–99)
LP-IR Score: 27 (ref <=45)
Small LDL Particle Number: 824 nmol/L — ABNORMAL HIGH (ref <=527)
TRIGLYCERIDES BY NMR: 125 mg/dL (ref 0–149)

## 2014-05-15 LAB — BMP8+EGFR
BUN / CREAT RATIO: 26 (ref 11–26)
BUN: 26 mg/dL (ref 8–27)
CALCIUM: 9.6 mg/dL (ref 8.7–10.3)
CO2: 22 mmol/L (ref 18–29)
Chloride: 100 mmol/L (ref 97–108)
Creatinine, Ser: 1.01 mg/dL — ABNORMAL HIGH (ref 0.57–1.00)
GFR calc Af Amer: 61 mL/min/{1.73_m2} (ref >59)
GFR calc non Af Amer: 53 mL/min/{1.73_m2} — ABNORMAL LOW (ref >59)
Glucose: 87 mg/dL (ref 65–99)
Potassium: 4.4 mmol/L (ref 3.5–5.2)
Sodium: 139 mmol/L (ref 134–144)

## 2014-05-15 LAB — HEPATIC FUNCTION PANEL
ALBUMIN: 4.5 g/dL (ref 3.5–4.8)
ALT: 11 IU/L (ref 0–32)
AST: 21 IU/L (ref 0–40)
Alkaline Phosphatase: 105 IU/L (ref 39–117)
Bilirubin, Direct: 0.17 mg/dL (ref 0.00–0.40)
Total Bilirubin: 0.6 mg/dL (ref 0.0–1.2)
Total Protein: 7.7 g/dL (ref 6.0–8.5)

## 2014-05-15 LAB — VITAMIN D 25 HYDROXY (VIT D DEFICIENCY, FRACTURES): VIT D 25 HYDROXY: 34.7 ng/mL (ref 30.0–100.0)

## 2014-05-19 ENCOUNTER — Other Ambulatory Visit: Payer: Medicare Other

## 2014-05-19 DIAGNOSIS — Z1212 Encounter for screening for malignant neoplasm of rectum: Secondary | ICD-10-CM

## 2014-05-19 NOTE — Progress Notes (Signed)
Pt brought in FOBT only

## 2014-05-20 LAB — FECAL OCCULT BLOOD, IMMUNOCHEMICAL: Fecal Occult Bld: NEGATIVE

## 2014-05-25 ENCOUNTER — Other Ambulatory Visit: Payer: Self-pay | Admitting: *Deleted

## 2014-05-25 MED ORDER — VERAPAMIL HCL 120 MG PO TABS: ORAL_TABLET | ORAL | Status: DC

## 2014-05-25 NOTE — Telephone Encounter (Signed)
Call patient and find out how she is taking it and correct the directions on the prescribed medication

## 2014-05-25 NOTE — Telephone Encounter (Signed)
Received fax from [facility] requesting clarification on dose for verapamil. Directions state take 1.5 tablets qd and also says take 1.5 tabs bid. Please advise on correct dose and resend to mail order

## 2014-05-25 NOTE — Addendum Note (Signed)
Addended by: Zannie Cove on: 05/25/2014 04:12 PM   Modules accepted: Orders

## 2014-05-27 ENCOUNTER — Telehealth: Payer: Self-pay | Admitting: Family Medicine

## 2014-05-27 NOTE — Telephone Encounter (Signed)
Nurse spoke with pharmacy that was calling.

## 2014-09-09 ENCOUNTER — Ambulatory Visit (INDEPENDENT_AMBULATORY_CARE_PROVIDER_SITE_OTHER): Payer: Medicare Other

## 2014-09-09 DIAGNOSIS — Z23 Encounter for immunization: Secondary | ICD-10-CM

## 2014-09-23 ENCOUNTER — Ambulatory Visit (INDEPENDENT_AMBULATORY_CARE_PROVIDER_SITE_OTHER): Payer: Medicare Other | Admitting: Family Medicine

## 2014-09-23 ENCOUNTER — Encounter: Payer: Self-pay | Admitting: Family Medicine

## 2014-09-23 VITALS — BP 157/92 | HR 86 | Temp 96.7°F | Ht 62.75 in | Wt 162.4 lb

## 2014-09-23 DIAGNOSIS — R351 Nocturia: Secondary | ICD-10-CM

## 2014-09-23 DIAGNOSIS — M810 Age-related osteoporosis without current pathological fracture: Secondary | ICD-10-CM

## 2014-09-23 DIAGNOSIS — I2510 Atherosclerotic heart disease of native coronary artery without angina pectoris: Secondary | ICD-10-CM

## 2014-09-23 DIAGNOSIS — I10 Essential (primary) hypertension: Secondary | ICD-10-CM

## 2014-09-23 DIAGNOSIS — E7800 Pure hypercholesterolemia, unspecified: Secondary | ICD-10-CM

## 2014-09-23 DIAGNOSIS — F4323 Adjustment disorder with mixed anxiety and depressed mood: Secondary | ICD-10-CM

## 2014-09-23 DIAGNOSIS — E039 Hypothyroidism, unspecified: Secondary | ICD-10-CM

## 2014-09-23 DIAGNOSIS — N289 Disorder of kidney and ureter, unspecified: Secondary | ICD-10-CM

## 2014-09-23 DIAGNOSIS — E559 Vitamin D deficiency, unspecified: Secondary | ICD-10-CM

## 2014-09-23 LAB — POCT URINALYSIS DIPSTICK
Bilirubin, UA: NEGATIVE
GLUCOSE UA: NEGATIVE
KETONES UA: NEGATIVE
Nitrite, UA: NEGATIVE
Protein, UA: NEGATIVE
Spec Grav, UA: 1.015
UROBILINOGEN UA: NEGATIVE
pH, UA: 6

## 2014-09-23 LAB — POCT UA - MICROSCOPIC ONLY
BACTERIA, U MICROSCOPIC: NEGATIVE
Casts, Ur, LPF, POC: NEGATIVE
Crystals, Ur, HPF, POC: NEGATIVE
Mucus, UA: NEGATIVE
Yeast, UA: NEGATIVE

## 2014-09-23 LAB — POCT CBC
Granulocyte percent: 65.7 %G (ref 37–80)
HEMATOCRIT: 46.7 % (ref 37.7–47.9)
HEMOGLOBIN: 15.6 g/dL (ref 12.2–16.2)
Lymph, poc: 1.6 (ref 0.6–3.4)
MCH, POC: 30.9 pg (ref 27–31.2)
MCHC: 33.4 g/dL (ref 31.8–35.4)
MCV: 92.6 fL (ref 80–97)
MPV: 7.7 fL (ref 0–99.8)
POC GRANULOCYTE: 3.7 (ref 2–6.9)
POC LYMPH %: 27.7 % (ref 10–50)
Platelet Count, POC: 200 10*3/uL (ref 142–424)
RBC: 5 M/uL (ref 4.04–5.48)
RDW, POC: 13.1 %
WBC: 5.6 10*3/uL (ref 4.6–10.2)

## 2014-09-23 MED ORDER — LORAZEPAM 0.5 MG PO TABS: 0.5000 mg | ORAL_TABLET | Freq: Two times a day (BID) | ORAL | Status: DC

## 2014-09-23 MED ORDER — ESCITALOPRAM OXALATE 10 MG PO TABS: ORAL_TABLET | ORAL | Status: DC

## 2014-09-23 NOTE — Patient Instructions (Addendum)
Continue to be careful to not put herself at risk for falling Continue current medications Continue aggressive therapeutic lifestyle changes Try the Lexapro and take one half by mouth every morning to see if this helps calm some of your anxieties and stresses that you're dealing with at home We will arrange for you to have an appointment with a dermatologist after Christmas at your request Continue to monitor blood pressure readings at home when you come to the next visit, bring your blood pressure monitor and check your blood pressure in the office with your monitor   Continue current medications. Continue good therapeutic lifestyle changes which include good diet and exercise. Fall precautions discussed with patient. If an FOBT was given today- please return it to our front desk. If you are over 28 years old - you may need Prevnar 73 or the adult Pneumonia vaccine.  Flu Shots will be available at our office starting mid- September. Please call and schedule a FLU CLINIC APPOINTMENT.

## 2014-09-23 NOTE — Addendum Note (Signed)
Addended by: Zannie Cove on: 09/23/2014 10:25 AM   Modules accepted: Orders

## 2014-09-23 NOTE — Addendum Note (Signed)
Addended by: Zannie Cove on: 09/23/2014 10:21 AM   Modules accepted: Orders

## 2014-09-23 NOTE — Progress Notes (Signed)
Subjective:    Patient ID: Emily Wagner, female    DOB: [birth year], 80 y.o.   MRN: [id number]  HPI Pt here for follow up of chronic medical problems. The patient appears very anxious today. She worries about her daughter a lot and there is a lot of stress and tension at home because of this. She complains of some soreness in the left arm following the Prevnar vaccine. She is also concerned with a couple skin lesions above the right eye and nose. She will get lab work today and she is requesting something stronger than her lorazepam for her anxiety. She does bring in home blood pressures for review and all of these are excellent. She says that her readings are always high in the doctor's office.      Patient Active Problem List   Diagnosis Date Noted  . Allergy to multiple medications 07/15/2013  . Renal insufficiency   . Adjustment disorder with mixed anxiety and depressed mood   . Osteoporosis   . ASCVD (arteriosclerotic cardiovascular disease)   . 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  . HYPERCHOLESTEROLEMIA 05/02/2008  . HYPERTENSION 05/02/2008  . INTERSTITIAL CYSTITIS 05/02/2008  . DIVERTICULOSIS OF COLON 08/27/2003   Outpatient Encounter Prescriptions as of 09/23/2014  Medication Sig  . aspirin EC 81 MG tablet Take 81 mg by mouth daily.  . calcium carbonate (OS-CAL) 600 MG TABS tablet Take 600 mg by mouth daily with breakfast.  . Cholecalciferol (VITAMIN D) 2000 UNITS CAPS Take 1 capsule by mouth daily.  Marland Kitchen levothyroxine (SYNTHROID, LEVOTHROID) 50 MCG tablet Take 1 tablet (50 mcg total) by mouth daily.  Marland Kitchen LORazepam (ATIVAN) 0.5 MG tablet Take 1 tablet (0.5 mg total) by mouth 2 (two) times daily.  . meclizine (ANTIVERT) 12.5 MG tablet Take 1 tablet (12.5 mg total) by mouth 2 (two) times daily as needed for dizziness.  . Multiple Vitamins-Minerals (PRESERVISION AREDS 2) CAPS Take 1 capsule by mouth daily.  . verapamil (CALAN) 120 MG tablet 1 I/2 tab bid  . [DISCONTINUED] vitamin C  (ASCORBIC ACID) 500 MG tablet Take 500 mg by mouth 3 (three) times daily.  . ciprofloxacin (CIPRO) 500 MG tablet Take 1 tablet (500 mg total) by mouth daily. As directed    Review of Systems  Constitutional: Negative.   HENT: Negative.   Eyes: Negative.   Respiratory: Negative.   Cardiovascular: Negative.   Gastrointestinal: Negative.        Hernia  Endocrine: Negative.   Genitourinary: Negative.   Musculoskeletal: Positive for myalgias (left arm pain after Prevnar vaccine).  Skin: Negative.        Skin lesion above right eye and on nose  Allergic/Immunologic: Negative.   Neurological: Negative.   Hematological: Negative.   Psychiatric/Behavioral: Negative.        Objective:   Physical Exam  Nursing note and vitals reviewed. Constitutional: She is oriented to person, place, and time. She appears well-developed and well-nourished. She appears distressed (the patient is tearful anxious and crying because of her home situation with her daughter.).  HENT:  Head: Normocephalic and atraumatic.  Right Ear: External ear normal.  Left Ear: External ear normal.  Nose: Nose normal.  Mouth/Throat: Oropharynx is clear and moist. No oropharyngeal exudate.  Eyes: Conjunctivae and EOM are normal. Pupils are equal, round, and reactive to light. Right eye exhibits no discharge. Left eye exhibits no discharge. No scleral icterus.  Neck: Normal range of motion. Neck supple. No thyromegaly present.  No carotid bruits  Cardiovascular:  Normal rate, regular rhythm, normal heart sounds and intact distal pulses.  Exam reveals no gallop and no friction rub.   No murmur heard. At 84/m  Pulmonary/Chest: Effort normal and breath sounds normal. No respiratory distress. She has no wheezes. She has no rales. She exhibits no tenderness.  Abdominal: Soft. Bowel sounds are normal. She exhibits no mass. There is no tenderness. There is no rebound and no guarding.  Musculoskeletal: Normal range of motion. She  exhibits no edema.  Lymphadenopathy:    She has no cervical adenopathy.  Neurological: She is alert and oriented to person, place, and time. She has normal reflexes. No cranial nerve deficit.  Skin: Skin is warm and dry. No rash noted. No erythema. No pallor.  A couple of skin lesions one above the right eye in 1 on the right lateral nostril  Psychiatric: She has a normal mood and affect. Her behavior is normal. Judgment and thought content normal.  Depressed behavior, anxious behavior    BP 157/92  Pulse 86  Temp(Src) 96.7 F (35.9 C) (Oral)  Ht 5' 2.75" (1.594 m)  Wt 162 lb 6 oz (73.653 kg)  BMI 28.99 kg/m2  LMP 03/21/1971         Assessment & Plan:  1. Essential hypertension - POCT CBC - BMP8+EGFR - Hepatic function panel  2. ASCVD (arteriosclerotic cardiovascular disease) - POCT CBC  3. Osteoporosis - POCT CBC - Vit D  25 hydroxy (rtn osteoporosis monitoring)  4. Renal insufficiency - POCT CBC - BMP8+EGFR  5. HYPERCHOLESTEROLEMIA - POCT CBC - Lipid panel - Hepatic function panel  6. Hypothyroidism, unspecified hypothyroidism type - Thyroid Panel With TSH  7. Vitamin D deficiency - Vit D  25 hydroxy (rtn osteoporosis monitoring)  8. Nocturia - POCT urinalysis dipstick - POCT UA - Microscopic Only - Urine culture  9. Adjustment disorder with mixed anxiety and depressed mood - LORazepam (ATIVAN) 0.5 MG tablet; Take 1 tablet (0.5 mg total) by mouth 2 (two) times daily.  Dispense: 180 tablet; Refill: 3 - escitalopram (LEXAPRO) 10 MG tablet; 1 daily as directed  Dispense: 30 tablet; Refill: 5  Patient Instructions  Continue to be careful to not put herself at risk for falling Continue current medications Continue aggressive therapeutic lifestyle changes Try the Lexapro and take one half by mouth every morning to see if this helps calm some of your anxieties and stresses that you're dealing with at home We will arrange for you to have an appointment with  a dermatologist after Christmas at your request Continue to monitor blood pressure readings at home when you come to the next visit, bring your blood pressure monitor and check your blood pressure in the office with your monitor    Arrie Senate MD

## 2014-09-24 ENCOUNTER — Telehealth: Payer: Self-pay | Admitting: *Deleted

## 2014-09-24 LAB — LIPID PANEL
CHOLESTEROL TOTAL: 260 mg/dL — AB (ref 100–199)
Chol/HDL Ratio: 5.5 ratio units — ABNORMAL HIGH (ref 0.0–4.4)
HDL: 47 mg/dL (ref >39)
LDL Calculated: 182 mg/dL — ABNORMAL HIGH (ref 0–99)
Triglycerides: 155 mg/dL — ABNORMAL HIGH (ref 0–149)
VLDL CHOLESTEROL CAL: 31 mg/dL (ref 5–40)

## 2014-09-24 LAB — BMP8+EGFR
BUN / CREAT RATIO: 24 (ref 11–26)
BUN: 23 mg/dL (ref 8–27)
CO2: 20 mmol/L (ref 18–29)
Calcium: 9.8 mg/dL (ref 8.7–10.3)
Chloride: 100 mmol/L (ref 97–108)
Creatinine, Ser: 0.95 mg/dL (ref 0.57–1.00)
GFR calc non Af Amer: 57 mL/min/{1.73_m2} — ABNORMAL LOW (ref >59)
GFR, EST AFRICAN AMERICAN: 66 mL/min/{1.73_m2} (ref >59)
Glucose: 84 mg/dL (ref 65–99)
POTASSIUM: 4.6 mmol/L (ref 3.5–5.2)
SODIUM: 140 mmol/L (ref 134–144)

## 2014-09-24 LAB — HEPATIC FUNCTION PANEL
ALBUMIN: 4.4 g/dL (ref 3.5–4.8)
ALK PHOS: 111 IU/L (ref 39–117)
ALT: 9 IU/L (ref 0–32)
AST: 19 IU/L (ref 0–40)
BILIRUBIN DIRECT: 0.15 mg/dL (ref 0.00–0.40)
BILIRUBIN TOTAL: 0.5 mg/dL (ref 0.0–1.2)
Total Protein: 7.8 g/dL (ref 6.0–8.5)

## 2014-09-24 LAB — THYROID PANEL WITH TSH
Free Thyroxine Index: 2.2 (ref 1.2–4.9)
T3 UPTAKE RATIO: 28 % (ref 24–39)
T4, Total: 7.7 ug/dL (ref 4.5–12.0)
TSH: 2.14 u[IU]/mL (ref 0.450–4.500)

## 2014-09-24 LAB — VITAMIN D 25 HYDROXY (VIT D DEFICIENCY, FRACTURES): Vit D, 25-Hydroxy: 33.8 ng/mL (ref 30.0–100.0)

## 2014-09-24 NOTE — Telephone Encounter (Signed)
Pt notified of lab results Verbalizes understanding 

## 2014-09-24 NOTE — Telephone Encounter (Signed)
Message copied by Marin Olp on Thu Sep 24, 2014 10:28 AM ------      Message from: Chipper Herb      Created: Thu Sep 24, 2014  7:36 AM       On a traditional lipid panel, the LDL cholesterol is very elevated at 182. The good cholesterol is within normal limits. The triglycerides are also very elevated at 155. The patient is statin intolerant and therefore cannot take any statin drugs. Please encourage her to do better with her diet and exercise regimen to get the cholesterol under better control      The blood sugar is good at 84. The creatinine, the most important kidney function test is within normal limits this time which is improved from before. The glomerular filtration rate remain slightly decreased, as it has been in the past. The electrolytes including potassium are within normal limits      The vitamin D level is at the low end of the normal range, continue current treatment      All liver function tests are within normal limits      All thyroid function tests are within normal limits      --A urine culture is pending ------

## 2014-09-25 LAB — URINE CULTURE

## 2014-09-29 ENCOUNTER — Telehealth: Payer: Self-pay | Admitting: Family Medicine

## 2014-09-29 NOTE — Telephone Encounter (Signed)
-----   Message from Chipper Herb, MD sent at 09/25/2014  8:48 PM EDT ----- The patient has another urinary tract infection caused by Escherichia coli. She is allergic to sulfa, ciprofloxacin, and Macrodantin. According to the computer she is not allergic to doxycycline. Please confirm this with her. The bacteria that are growing a sensitive to doxycycline. If she is not allergic to this, please start doxycycline 100 mg twice daily for 7 days with food. Call this prescription in for the patient. Once again if she is not allergic to it

## 2014-10-04 ENCOUNTER — Emergency Department (HOSPITAL_COMMUNITY)
Admission: EM | Admit: 2014-10-04 | Discharge: 2014-10-04 | Disposition: A | Discharge status: Home / Self Care | Payer: Medicare Other | Attending: Emergency Medicine | Admitting: Emergency Medicine

## 2014-10-04 ENCOUNTER — Emergency Department (HOSPITAL_COMMUNITY): Payer: Medicare Other

## 2014-10-04 ENCOUNTER — Encounter (HOSPITAL_COMMUNITY): Payer: Self-pay | Admitting: Emergency Medicine

## 2014-10-04 DIAGNOSIS — Y9289 Other specified places as the place of occurrence of the external cause: Secondary | ICD-10-CM | POA: Insufficient documentation

## 2014-10-04 DIAGNOSIS — S63502A Unspecified sprain of left wrist, initial encounter: Secondary | ICD-10-CM

## 2014-10-04 DIAGNOSIS — Z7982 Long term (current) use of aspirin: Secondary | ICD-10-CM | POA: Diagnosis not present

## 2014-10-04 DIAGNOSIS — Z87448 Personal history of other diseases of urinary system: Secondary | ICD-10-CM | POA: Diagnosis not present

## 2014-10-04 DIAGNOSIS — I119 Hypertensive heart disease without heart failure: Secondary | ICD-10-CM | POA: Insufficient documentation

## 2014-10-04 DIAGNOSIS — W01198A Fall on same level from slipping, tripping and stumbling with subsequent striking against other object, initial encounter: Secondary | ICD-10-CM | POA: Diagnosis not present

## 2014-10-04 DIAGNOSIS — S6992XA Unspecified injury of left wrist, hand and finger(s), initial encounter: Secondary | ICD-10-CM | POA: Diagnosis present

## 2014-10-04 DIAGNOSIS — Y9389 Activity, other specified: Secondary | ICD-10-CM | POA: Diagnosis not present

## 2014-10-04 DIAGNOSIS — S0083XA Contusion of other part of head, initial encounter: Secondary | ICD-10-CM | POA: Diagnosis not present

## 2014-10-04 DIAGNOSIS — S6392XA Sprain of unspecified part of left wrist and hand, initial encounter: Secondary | ICD-10-CM | POA: Diagnosis not present

## 2014-10-04 DIAGNOSIS — M8580 Other specified disorders of bone density and structure, unspecified site: Secondary | ICD-10-CM | POA: Insufficient documentation

## 2014-10-04 DIAGNOSIS — E785 Hyperlipidemia, unspecified: Secondary | ICD-10-CM | POA: Insufficient documentation

## 2014-10-04 DIAGNOSIS — F4323 Adjustment disorder with mixed anxiety and depressed mood: Secondary | ICD-10-CM | POA: Insufficient documentation

## 2014-10-04 DIAGNOSIS — Z79899 Other long term (current) drug therapy: Secondary | ICD-10-CM | POA: Insufficient documentation

## 2014-10-04 DIAGNOSIS — Y998 Other external cause status: Secondary | ICD-10-CM | POA: Insufficient documentation

## 2014-10-04 DIAGNOSIS — W19XXXA Unspecified fall, initial encounter: Secondary | ICD-10-CM

## 2014-10-04 DIAGNOSIS — I2510 Atherosclerotic heart disease of native coronary artery without angina pectoris: Secondary | ICD-10-CM | POA: Insufficient documentation

## 2014-10-04 DIAGNOSIS — S0012XA Contusion of left eyelid and periocular area, initial encounter: Secondary | ICD-10-CM | POA: Diagnosis not present

## 2014-10-04 DIAGNOSIS — S59912A Unspecified injury of left forearm, initial encounter: Secondary | ICD-10-CM | POA: Insufficient documentation

## 2014-10-04 NOTE — Discharge Instructions (Signed)
Wear the splint until your pain is gone.  Recheck with your physician in one week for additional x-rays if still painful.  Wrist Pain A wrist sprain happens when the bands of tissue that hold the wrist joints together (ligament) stretch too much or tear. A wrist strain happens when muscles or bands of tissue that connect muscles to bones (tendons) are stretched or pulled. HOME CARE  Put ice on the injured area.  Put ice in a plastic bag.  Place a towel between your skin and the bag.  Leave the ice on for 15-20 minutes, 03-04 times a day, for the first 2 days.  Raise (elevate) the injured wrist to lessen puffiness (swelling).  Rest the injured wrist for at least 48 hours or as told by your doctor.  Wear a splint, cast, or an elastic wrap as told by your doctor.  Only take medicine as told by your doctor.  Follow up with your doctor as told. This is important. GET HELP RIGHT AWAY IF:   The fingers are puffy, very red, white, or cold and blue.  The fingers lose feeling (numb) or tingle.  The pain gets worse.  It is hard to move the fingers. MAKE SURE YOU:   Understand these instructions.  Will watch your condition.  Will get help right away if you are not doing well or get worse. Document Released: 05/01/2008 Document Revised: 02/05/2012 Document Reviewed: 01/04/2011 [facility] Patient Information 2015 [place], [place]. This information is not intended to replace advice given to you by your health care provider. Make sure you discuss any questions you have with your health care provider.

## 2014-10-04 NOTE — ED Provider Notes (Signed)
CSN: [id number]     Arrival date & time 10/04/14  1005 History  This chart was scribed for Emily Furry, MD by Martinique Peace, ED Scribe. The patient was seen in [place]. The patient's care was started at 10:30 AM.    Chief Complaint  Patient presents with  . Fall      Patient is a 80 y.o. female presenting with fall. The history is provided by the patient. No language interpreter was used.  Fall Pertinent negatives include no chest pain, no abdominal pain, no headaches and no shortness of breath.   HPI Comments: Emily Wagner is a 80 y.o. female who presents to the Emergency Department complaining of fall onset last night where pt tripped over parking boom, injuring her left arm and hitting her head. Pt reports current pain is unlike anything she has felt before. Pt does have bruising around left forehead and left eye. She denies LOC or being on any blood thinners. Pt denies offer for pain medications because she reports she is allergic to it.    Past Medical History  Diagnosis Date  . Renal insufficiency   . Other and unspecified hyperlipidemia   . Benign hypertensive heart disease without heart failure   . Adjustment disorder with mixed anxiety and depressed mood   . Other dyspnea and respiratory abnormality   . Hematuria   . Interstitial cystitis   . Osteopenia   . ASCVD (arteriosclerotic cardiovascular disease)    Past Surgical History  Procedure Laterality Date  . Fracture surgery      Right hip  . Cholecystectomy    . Hernia repair      Right inguinal  . Abdominal hysterectomy      Cancer and endometriosis   History reviewed. No pertinent family history. History  Substance Use Topics  . Smoking status: Never Smoker   . Smokeless tobacco: Not on file  . Alcohol Use: No   OB History    No data available     Review of Systems  Constitutional: Negative for fever, chills, diaphoresis, appetite change and fatigue.  HENT: Negative for mouth sores, sore throat  and trouble swallowing.   Eyes: Negative for visual disturbance.  Respiratory: Negative for cough, chest tightness, shortness of breath and wheezing.   Cardiovascular: Negative for chest pain.  Gastrointestinal: Negative for nausea, vomiting, abdominal pain, diarrhea and abdominal distention.  Endocrine: Negative for polydipsia, polyphagia and polyuria.  Genitourinary: Negative for dysuria, frequency and hematuria.  Musculoskeletal: Negative for gait problem.       Left arm pain.   Skin: Negative for color change, pallor and rash.       Bruising to left forehead and left eye.  Neurological: Negative for dizziness, syncope, light-headedness and headaches.  Hematological: Does not bruise/bleed easily.  Psychiatric/Behavioral: Negative for behavioral problems and confusion.      Allergies  Alendronate sodium; Aspirin free childs; Ciprofloxacin; Codeine; Crestor; Darvon; Flonase; Lipitor; Nitrofurantoin monohyd macro; Pravachol; Prevnar 13; Sulfa antibiotics; Ultram; Welchol; Zetia; and Zocor  Home Medications   Prior to Admission medications   Medication Sig Start Date End Date Taking? Authorizing Provider  aspirin EC 81 MG tablet Take 81 mg by mouth daily.   Yes Historical Provider, MD  calcium carbonate (OS-CAL) 600 MG TABS tablet Take 600 mg by mouth daily with breakfast.   Yes Historical Provider, MD  Cholecalciferol (VITAMIN D) 2000 UNITS CAPS Take 1 capsule by mouth daily.   Yes Historical Provider, MD  levothyroxine (SYNTHROID, LEVOTHROID)  50 MCG tablet Take 1 tablet (50 mcg total) by mouth daily. 05/14/14  Yes Chipper Herb, MD  LORazepam (ATIVAN) 0.5 MG tablet Take 1 tablet (0.5 mg total) by mouth 2 (two) times daily. 09/23/14  Yes Chipper Herb, MD  Multiple Vitamins-Minerals (PRESERVISION AREDS 2) CAPS Take 1 capsule by mouth daily.   Yes Historical Provider, MD  ciprofloxacin (CIPRO) 500 MG tablet Take 1 tablet (500 mg total) by mouth daily. As directed Patient not taking:  Reported on 10/04/2014 05/14/14   Chipper Herb, MD  escitalopram [facility]) 10 MG tablet 1 daily as directed Patient not taking: Reported on 10/04/2014 09/23/14   Chipper Herb, MD  meclizine (ANTIVERT) 12.5 MG tablet Take 1 tablet (12.5 mg total) by mouth 2 (two) times daily as needed for dizziness. 05/14/14   Chipper Herb, MD  verapamil (CALAN) 120 MG tablet 1 I/2 tab bid Patient not taking: Reported on 10/04/2014 05/25/14   Chipper Herb, MD   BP 177/99 mmHg  Pulse 69  Temp(Src) 97.6 F (36.4 C) (Oral)  Resp 18  Ht 5\' 4"  (1.626 m)  Wt 155 lb (70.308 kg)  BMI 26.59 kg/m2  SpO2 96%  LMP 03/21/1971 Physical Exam  Constitutional: She is oriented to person, place, and time. She appears well-developed and well-nourished. No distress.  HENT:  Head: Normocephalic.  STS and ecchymosis. Left periorbital, cranial nerves intact. Normal globe.   Eyes: Conjunctivae are normal. Pupils are equal, round, and reactive to light. No scleral icterus.  Neck: Normal range of motion. Neck supple. No thyromegaly present.  Cardiovascular: Normal rate and regular rhythm.  Exam reveals no gallop and no friction rub.   No murmur heard. Pulmonary/Chest: Effort normal and breath sounds normal. No respiratory distress. She has no wheezes. She has no rales.  Abdominal: Soft. Bowel sounds are normal. She exhibits no distension. There is no rebound.  Musculoskeletal: Normal range of motion.  Tenderness and soft tissue swelling to distal aspect of left forearm. Normal elbow.   Neurological: She is alert and oriented to person, place, and time.  Skin: Skin is warm and dry. No rash noted.  Psychiatric: She has a normal mood and affect. Her behavior is normal.    ED Course  Procedures (including critical care time) Labs Review Labs Reviewed - No data to display  Imaging Review Dg Forearm Left  10/04/2014   CLINICAL DATA:  Tripped last night in restaurant parking lot, pain left forearm midshaft  EXAM: LEFT  FOREARM - 2 VIEW  COMPARISON:  None.  FINDINGS: There is no evidence of fracture or other focal bone lesions. Soft tissues are unremarkable.  IMPRESSION: Normal examination.   Electronically Signed   By: Enrique Sack M.D.   On: 10/04/2014 12:08   Dg Wrist Complete Left  10/04/2014   CLINICAL DATA:  Left wrist pain, status post fall  EXAM: LEFT WRIST - COMPLETE 3+ VIEW  COMPARISON:  None.  FINDINGS: No fracture or dislocation is seen.  Mild irregularity of the ulnar styloid without definite fracture.  Mild degenerative changes of the 1st carpometacarpal joint.  The visualized soft tissues are unremarkable.  IMPRESSION: No fracture or dislocation is seen.   Electronically Signed   By: Julian Hy M.D.   On: 10/04/2014 12:59   Ct Head Wo Contrast  10/04/2014   CLINICAL DATA:  Pt. Is complaining of fall onset last night where pt tripped over parking boom, injuring her left arm and hitting her head. Pt  reports current pain is unlike anything she has felt before. Pt does have bruising around left forehead and left eye. She denies LOC or being on any blood thinners.  EXAM: CT HEAD WITHOUT CONTRAST  TECHNIQUE: Contiguous axial images were obtained from the base of the skull through the vertex without intravenous contrast.  COMPARISON:  None.  FINDINGS: Diffusely enlarged ventricles and subarachnoid spaces. The cervical a old right basal ganglia lacunar infarct or prominent perivascular space. Prominent fluid-filled space in the posterior aspect of the posterior fossa, extending more core the left than toward the right. No skull fracture, intracranial hemorrhage or paranasal sinus air-fluid levels.  IMPRESSION: 1. No skull fracture or intracranial hemorrhage. 2. Mild atrophy and mild chronic small vessel white matter ischemic changes in both cerebral hemispheres. 3. Old right basal ganglia lacunar infarct or prominent perivascular space. 4. Posterior fossa arachnoid cyst, mega cisterna magna or Dandy-Walker  variant.   Electronically Signed   By: Enrique Sack M.D.   On: 10/04/2014 12:15     EKG Interpretation None     Medications - No data to display  10:35 AM- Treatment plan was discussed with patient who verbalizes understanding and agrees.   MDM   Final diagnoses:  Fall  Wrist sprain, left, initial encounter  Facial contusion, initial encounter    Patient has pain distal to the snuffbox. Tender in the same area. Full range of motion of the wrist with minimal symptoms. Normal digit function normal flexion extension of all digits. Normal neurovascular exam. Fitted with wrist splint. I reexamined her after splint was applied. An [facility] follow-up with her physician in one week with any continued symptoms. May need additional x-rays a referral at that point to rule out significant soft tissue injury, or occult fracture.  I personally performed the services described in this documentation, which was scribed in my presence. The recorded information has been reviewed and is accurate.   Emily Furry, MD 10/04/14 1356

## 2014-10-04 NOTE — ED Notes (Addendum)
Pt reports tripped last night over a parking boom last night. Pt reports caught herself with left arm but hit head. Pt denies loc or being on blood thinners. Pt reports left arm pain. Moderate contusion noted to left forehead and left eye. nad noted.

## 2014-10-06 ENCOUNTER — Telehealth: Payer: Self-pay | Admitting: Family Medicine

## 2014-10-06 ENCOUNTER — Telehealth: Payer: Self-pay | Admitting: *Deleted

## 2014-10-06 NOTE — Telephone Encounter (Signed)
Patient wanted to continue with a refill of cipro but agreed to try the doxycyclen.

## 2014-10-06 NOTE — Telephone Encounter (Signed)
-----   Message from Chipper Herb, MD sent at 09/25/2014  8:48 PM EDT ----- The patient has another urinary tract infection caused by Escherichia coli. She is allergic to sulfa, ciprofloxacin, and Macrodantin. According to the computer she is not allergic to doxycycline. Please confirm this with her. The bacteria that are growing a sensitive to doxycycline. If she is not allergic to this, please start doxycycline 100 mg twice daily for 7 days with food. Call this prescription in for the patient. Once again if she is not allergic to it

## 2014-10-06 NOTE — Telephone Encounter (Signed)
Daughter aware of results.  Script for Doxycyclen called to [facility] voice mail.  Please recheck medication allergies.

## 2015-02-10 ENCOUNTER — Ambulatory Visit (INDEPENDENT_AMBULATORY_CARE_PROVIDER_SITE_OTHER): Payer: Medicare Other | Admitting: Family Medicine

## 2015-02-10 ENCOUNTER — Encounter: Payer: Self-pay | Admitting: Family Medicine

## 2015-02-10 VITALS — BP 135/79 | HR 76 | Temp 97.2°F | Ht 64.0 in | Wt 166.0 lb

## 2015-02-10 DIAGNOSIS — F4323 Adjustment disorder with mixed anxiety and depressed mood: Secondary | ICD-10-CM

## 2015-02-10 DIAGNOSIS — E039 Hypothyroidism, unspecified: Secondary | ICD-10-CM | POA: Diagnosis not present

## 2015-02-10 DIAGNOSIS — J302 Other seasonal allergic rhinitis: Secondary | ICD-10-CM | POA: Diagnosis not present

## 2015-02-10 DIAGNOSIS — M810 Age-related osteoporosis without current pathological fracture: Secondary | ICD-10-CM

## 2015-02-10 DIAGNOSIS — I10 Essential (primary) hypertension: Secondary | ICD-10-CM | POA: Diagnosis not present

## 2015-02-10 DIAGNOSIS — E559 Vitamin D deficiency, unspecified: Secondary | ICD-10-CM | POA: Diagnosis not present

## 2015-02-10 DIAGNOSIS — N289 Disorder of kidney and ureter, unspecified: Secondary | ICD-10-CM | POA: Diagnosis not present

## 2015-02-10 DIAGNOSIS — I2510 Atherosclerotic heart disease of native coronary artery without angina pectoris: Secondary | ICD-10-CM

## 2015-02-10 DIAGNOSIS — E7800 Pure hypercholesterolemia, unspecified: Secondary | ICD-10-CM

## 2015-02-10 LAB — POCT CBC
Granulocyte percent: 70.3 %G (ref 37–80)
HEMATOCRIT: 48.3 % — AB (ref 37.7–47.9)
Hemoglobin: 15.1 g/dL (ref 12.2–16.2)
Lymph, poc: 1.6 (ref 0.6–3.4)
MCH, POC: 29.3 pg (ref 27–31.2)
MCHC: 31.3 g/dL — AB (ref 31.8–35.4)
MCV: 93.5 fL (ref 80–97)
MPV: 7.9 fL (ref 0–99.8)
POC Granulocyte: 4.4 (ref 2–6.9)
POC LYMPH PERCENT: 25.8 %L (ref 10–50)
Platelet Count, POC: 232 10*3/uL (ref 142–424)
RBC: 5.17 M/uL (ref 4.04–5.48)
RDW, POC: 13.3 %
WBC: 6.3 10*3/uL (ref 4.6–10.2)

## 2015-02-10 MED ORDER — VERAPAMIL HCL 120 MG PO TABS: ORAL_TABLET | ORAL | Status: DC

## 2015-02-10 MED ORDER — LORAZEPAM 0.5 MG PO TABS: 0.5000 mg | ORAL_TABLET | Freq: Two times a day (BID) | ORAL | Status: DC

## 2015-02-10 MED ORDER — LEVOTHYROXINE SODIUM 50 MCG PO TABS: 50.0000 ug | ORAL_TABLET | Freq: Every day | ORAL | Status: DC

## 2015-02-10 NOTE — Patient Instructions (Addendum)
Medicare Annual Wellness Visit  [facility] and the medical providers at [facility] strive to bring you the best medical care.  In doing so we not only want to address your current medical conditions and concerns but also to detect new conditions early and prevent illness, disease and health-related problems.    Medicare offers a yearly Wellness Visit which allows our clinical staff to assess your need for preventative services including immunizations, lifestyle education, counseling to decrease risk of preventable diseases and screening for fall risk and other medical concerns.    This visit is provided free of charge (no copay) for all Medicare recipients. The clinical pharmacists at [facility] have begun to conduct these Wellness Visits which will also include a thorough review of all your medications.    As you primary medical provider recommend that you make an appointment for your Annual Wellness Visit if you have not done so already this year.  You may set up this appointment before you leave today or you may call back ([phone]) and schedule an appointment.  Please make sure when you call that you mention that you are scheduling your Annual Wellness Visit with the clinical pharmacist so that the appointment may be made for the proper length of time.    Continue current medications. Continue good therapeutic lifestyle changes which include good diet and exercise. Fall precautions discussed with patient. If an FOBT was given today- please return it to our front desk. If you are over 63 years old - you may need Prevnar 66 or the adult Pneumonia vaccine.  Flu Shots are still available at our office. If you still haven't had one please call to set up a nurse visit to get one.   After your visit with [name] today you will receive a survey in the mail or online from [employer] regarding your care with [name]. Please take a moment to  fill this out. Your feedback is very important to [name] as you can help [name] better understand your patient needs as well as improve your experience and satisfaction. WE CARE ABOUT YOU!!!   Try to walk and exercise more Watch your calories and watch her diet more closely and try to lose some of the weight that you put on since we checked the last Use nasal saline for nasal congestion Continue Allegra and try small amount of Benadryl if needed for additional congestion realizing that it might make him more drowsy. Be careful and do not put yourself at risk for falling

## 2015-02-10 NOTE — Progress Notes (Signed)
Subjective:    Patient ID: Emily Wagner, female    DOB: 28-Mar-1933, 81 y.o.   MRN: [id number]  HPI Pt here for follow up and management of chronic medical problems which includes hypothyroid, hypertension, hyperlipidemia. She is taking medications regularly. The patient brings in outside blood pressures for review and these are good. She also brought in her on monitor and the readings on it were compatible with the blood pressure readings done in the office. She is complaining of some allergy congestion and she does take Allegra. She also is complaining with her hips hurting some.    Patient Active Problem List   Diagnosis Date Noted  . Allergy to multiple medications 07/15/2013  . Renal insufficiency   . Adjustment disorder with mixed anxiety and depressed mood   . Osteoporosis   . ASCVD (arteriosclerotic cardiovascular disease)   . 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  . HYPERCHOLESTEROLEMIA 05/02/2008  . Essential hypertension 05/02/2008  . INTERSTITIAL CYSTITIS 05/02/2008  . DIVERTICULOSIS OF COLON 08/27/2003   Outpatient Encounter Prescriptions as of 02/10/2015  Medication Sig  . aspirin EC 81 MG tablet Take 81 mg by mouth daily.  . calcium carbonate (OS-CAL) 600 MG TABS tablet Take 600 mg by mouth daily with breakfast.  . Cholecalciferol (VITAMIN D) 2000 UNITS CAPS Take 1 capsule by mouth daily.  Marland Kitchen escitalopram (LEXAPRO) 10 MG tablet 1 daily as directed  . levothyroxine (SYNTHROID, LEVOTHROID) 50 MCG tablet Take 1 tablet (50 mcg total) by mouth daily.  Marland Kitchen LORazepam (ATIVAN) 0.5 MG tablet Take 1 tablet (0.5 mg total) by mouth 2 (two) times daily.  . meclizine (ANTIVERT) 12.5 MG tablet Take 1 tablet (12.5 mg total) by mouth 2 (two) times daily as needed for dizziness.  . Multiple Vitamins-Minerals (PRESERVISION AREDS 2) CAPS Take 1 capsule by mouth daily.  . verapamil (CALAN) 120 MG tablet 1 I/2 tab bid  . ciprofloxacin (CIPRO) 500 MG tablet Take 1 tablet (500 mg total) by mouth  daily. As directed (Patient not taking: Reported on 10/04/2014)     Review of Systems  Constitutional: Negative.   HENT: Negative.   Eyes: Negative.   Respiratory: Negative.   Cardiovascular: Negative.   Gastrointestinal: Negative.   Endocrine: Negative.   Genitourinary: Negative.   Musculoskeletal: Negative.   Skin: Negative.   Allergic/Immunologic: Negative.   Neurological: Negative.   Hematological: Negative.   Psychiatric/Behavioral: Negative.        Objective:   Physical Exam  Constitutional: She is oriented to person, place, and time. She appears well-developed and well-nourished. No distress.  Alert and in good spirits.  HENT:  Head: Normocephalic and atraumatic.  Right Ear: External ear normal.  Left Ear: External ear normal.  Mouth/Throat: Oropharynx is clear and moist.  Nasal congestion and turbinate swelling bilaterally  Eyes: Conjunctivae and EOM are normal. Pupils are equal, round, and reactive to light. Right eye exhibits no discharge. Left eye exhibits no discharge. No scleral icterus.  Neck: Normal range of motion. Neck supple. No thyromegaly present.  No carotid bruits or anterior cervical adenopathy or posterior adenopathy.  Cardiovascular: Normal rate, regular rhythm, normal heart sounds and intact distal pulses.   No murmur heard. At 72/m  Pulmonary/Chest: Effort normal and breath sounds normal. No respiratory distress. She has no wheezes. She has no rales. She exhibits no tenderness.  Clear anteriorly and posteriorly  Abdominal: Soft. Bowel sounds are normal. She exhibits no mass. There is no tenderness. There is no rebound and no guarding.  Nontender without masses or organ enlargement  Musculoskeletal: Normal range of motion. She exhibits no edema or tenderness.  Good range of motion of both hips and knees without pain  Lymphadenopathy:    She has no cervical adenopathy.  Neurological: She is alert and oriented to person, place, and time. She has  normal reflexes. No cranial nerve deficit.  Skin: Skin is warm and dry. No rash noted.  Psychiatric: She has a normal mood and affect. Her behavior is normal. Judgment and thought content normal.  Nursing note and vitals reviewed.  BP 135/79 mmHg  Pulse 76  Temp(Src) 97.2 F (36.2 C) (Oral)  Ht $R'5\' 4"'FX$  (1.626 m)  Wt 166 lb (75.297 kg)  BMI 28.48 kg/m2  LMP 03/21/1971        Assessment & Plan:  1. ASCVD (arteriosclerotic cardiovascular disease) -The patient is having no complaints with chest pain or chest tightness. - POCT CBC  2. Essential hypertension -The blood pressure readings both at home and in the office have been under good control and outside readings were brought in for review and her monitor was compared with a heart monitor and the readings were similar. - POCT CBC - BMP8+EGFR - Hepatic function panel  3. HYPERCHOLESTEROLEMIA -The patient is statin intolerant and she can only control her cholesterol with diet with exercise - POCT CBC - NMR, lipoprofile  4. Renal insufficiency -Her last creatinine was good and she is aware that she cannot take any NSAIDs. - POCT CBC - BMP8+EGFR  5. Osteoporosis -The patient is up-to-date on her DEXA scans will not be due until another year. - POCT CBC  6. Hypothyroidism, unspecified hypothyroidism type -This will be monitored in the lab work today and any changes will be called to the patient as far as her medication is concerned. - POCT CBC - Thyroid Panel With TSH  7. Vitamin D deficiency -She should continue her current treatment pending results of lab work - POCT CBC - Vit D  25 hydroxy (rtn osteoporosis monitoring)  8. Adjustment disorder with mixed anxiety and depressed mood -She is calm today but requests a refill on her lorazepam which we will do. She is always being stressed because of worrying about her daughter that lives with her he has severe insulin-dependent diabetes. - LORazepam (ATIVAN) 0.5 MG tablet;  Take 1 tablet (0.5 mg total) by mouth 2 (two) times daily.  Dispense: 180 tablet; Refill: 3  9. Other seasonal allergic rhinitis -Continue with Allegra, nasal saline, and add Benadryl to the Allegra once daily if needed  Meds ordered this encounter  Medications  . verapamil (CALAN) 120 MG tablet    Sig: 1 I/2 tab bid    Dispense:  270 tablet    Refill:  3  . levothyroxine (SYNTHROID, LEVOTHROID) 50 MCG tablet    Sig: Take 1 tablet (50 mcg total) by mouth daily.    Dispense:  90 tablet    Refill:  3  . LORazepam (ATIVAN) 0.5 MG tablet    Sig: Take 1 tablet (0.5 mg total) by mouth 2 (two) times daily.    Dispense:  180 tablet    Refill:  3   Patient Instructions                       Medicare Annual Wellness Visit  [facility] and the medical providers at [facility] strive to bring you the best medical care.  In doing so we not only  want to address your current medical conditions and concerns but also to detect new conditions early and prevent illness, disease and health-related problems.    Medicare offers a yearly Wellness Visit which allows our clinical staff to assess your need for preventative services including immunizations, lifestyle education, counseling to decrease risk of preventable diseases and screening for fall risk and other medical concerns.    This visit is provided free of charge (no copay) for all Medicare recipients. The clinical pharmacists at [facility] have begun to conduct these Wellness Visits which will also include a thorough review of all your medications.    As you primary medical provider recommend that you make an appointment for your Annual Wellness Visit if you have not done so already this year.  You may set up this appointment before you leave today or you may call back ([phone]) and schedule an appointment.  Please make sure when you call that you mention that you are scheduling your Annual Wellness Visit  with the clinical pharmacist so that the appointment may be made for the proper length of time.    Continue current medications. Continue good therapeutic lifestyle changes which include good diet and exercise. Fall precautions discussed with patient. If an FOBT was given today- please return it to our front desk. If you are over 3 years old - you may need Prevnar 49 or the adult Pneumonia vaccine.  Flu Shots are still available at our office. If you still haven't had one please call to set up a nurse visit to get one.   After your visit with [name] today you will receive a survey in the mail or online from [employer] regarding your care with [name]. Please take a moment to fill this out. Your feedback is very important to [name] as you can help [name] better understand your patient needs as well as improve your experience and satisfaction. WE CARE ABOUT YOU!!!   Try to walk and exercise more Watch your calories and watch her diet more closely and try to lose some of the weight that you put on since we checked the last Use nasal saline for nasal congestion Continue Allegra and try small amount of Benadryl if needed for additional congestion realizing that it might make him more drowsy. Be careful and do not put yourself at risk for falling   Arrie Senate MD

## 2015-02-11 LAB — HEPATIC FUNCTION PANEL
ALK PHOS: 104 IU/L (ref 39–117)
ALT: 8 IU/L (ref 0–32)
AST: 17 IU/L (ref 0–40)
Albumin: 4.3 g/dL (ref 3.5–4.8)
Bilirubin Total: 0.6 mg/dL (ref 0.0–1.2)
Bilirubin, Direct: 0.14 mg/dL (ref 0.00–0.40)
Total Protein: 7.4 g/dL (ref 6.0–8.5)

## 2015-02-11 LAB — BMP8+EGFR
BUN/Creatinine Ratio: 19 (ref 11–26)
BUN: 21 mg/dL (ref 8–27)
CO2: 20 mmol/L (ref 18–29)
CREATININE: 1.12 mg/dL — AB (ref 0.57–1.00)
Calcium: 9.3 mg/dL (ref 8.7–10.3)
Chloride: 102 mmol/L (ref 97–108)
GFR calc Af Amer: 54 mL/min/{1.73_m2} — ABNORMAL LOW (ref >59)
GFR calc non Af Amer: 47 mL/min/{1.73_m2} — ABNORMAL LOW (ref >59)
Glucose: 89 mg/dL (ref 65–99)
POTASSIUM: 4.1 mmol/L (ref 3.5–5.2)
Sodium: 139 mmol/L (ref 134–144)

## 2015-02-11 LAB — NMR, LIPOPROFILE
CHOLESTEROL: 242 mg/dL — AB (ref 100–199)
HDL Cholesterol by NMR: 46 mg/dL (ref >39)
HDL Particle Number: 24.9 umol/L — ABNORMAL LOW (ref >=30.5)
LDL Particle Number: 1852 nmol/L — ABNORMAL HIGH (ref <1000)
LDL Size: 21.4 nm (ref >20.5)
LDL-C: 168 mg/dL — ABNORMAL HIGH (ref 0–99)
SMALL LDL PARTICLE NUMBER: 111 nmol/L (ref <=527)
TRIGLYCERIDES BY NMR: 140 mg/dL (ref 0–149)

## 2015-02-11 LAB — THYROID PANEL WITH TSH
Free Thyroxine Index: 1.8 (ref 1.2–4.9)
T3 Uptake Ratio: 27 % (ref 24–39)
T4, Total: 6.7 ug/dL (ref 4.5–12.0)
TSH: 3.07 u[IU]/mL (ref 0.450–4.500)

## 2015-02-11 LAB — VITAMIN D 25 HYDROXY (VIT D DEFICIENCY, FRACTURES): Vit D, 25-Hydroxy: 42.1 ng/mL (ref 30.0–100.0)

## 2015-05-30 ENCOUNTER — Inpatient Hospital Stay (HOSPITAL_COMMUNITY)
Admission: EM | Admit: 2015-05-30 | Discharge: 2015-06-02 | DRG: 535 | Disposition: A | Discharge status: Skilled Nursing Facility | Payer: Medicare Other | Attending: Family Medicine | Admitting: Family Medicine

## 2015-05-30 ENCOUNTER — Encounter (HOSPITAL_COMMUNITY): Payer: Self-pay | Admitting: *Deleted

## 2015-05-30 ENCOUNTER — Emergency Department (HOSPITAL_COMMUNITY): Payer: Medicare Other

## 2015-05-30 DIAGNOSIS — K219 Gastro-esophageal reflux disease without esophagitis: Secondary | ICD-10-CM | POA: Diagnosis present

## 2015-05-30 DIAGNOSIS — F419 Anxiety disorder, unspecified: Secondary | ICD-10-CM | POA: Diagnosis present

## 2015-05-30 DIAGNOSIS — E860 Dehydration: Secondary | ICD-10-CM | POA: Diagnosis present

## 2015-05-30 DIAGNOSIS — F4323 Adjustment disorder with mixed anxiety and depressed mood: Secondary | ICD-10-CM | POA: Diagnosis present

## 2015-05-30 DIAGNOSIS — S329XXA Fracture of unspecified parts of lumbosacral spine and pelvis, initial encounter for closed fracture: Secondary | ICD-10-CM

## 2015-05-30 DIAGNOSIS — N390 Urinary tract infection, site not specified: Secondary | ICD-10-CM | POA: Diagnosis present

## 2015-05-30 DIAGNOSIS — I119 Hypertensive heart disease without heart failure: Secondary | ICD-10-CM | POA: Diagnosis present

## 2015-05-30 DIAGNOSIS — S300XXA Contusion of lower back and pelvis, initial encounter: Secondary | ICD-10-CM | POA: Diagnosis present

## 2015-05-30 DIAGNOSIS — J9811 Atelectasis: Secondary | ICD-10-CM | POA: Diagnosis present

## 2015-05-30 DIAGNOSIS — J9601 Acute respiratory failure with hypoxia: Secondary | ICD-10-CM | POA: Diagnosis not present

## 2015-05-30 DIAGNOSIS — E039 Hypothyroidism, unspecified: Secondary | ICD-10-CM | POA: Diagnosis present

## 2015-05-30 DIAGNOSIS — R0902 Hypoxemia: Secondary | ICD-10-CM

## 2015-05-30 DIAGNOSIS — E785 Hyperlipidemia, unspecified: Secondary | ICD-10-CM | POA: Diagnosis present

## 2015-05-30 DIAGNOSIS — S32591A Other specified fracture of right pubis, initial encounter for closed fracture: Secondary | ICD-10-CM | POA: Diagnosis not present

## 2015-05-30 DIAGNOSIS — I10 Essential (primary) hypertension: Secondary | ICD-10-CM | POA: Diagnosis present

## 2015-05-30 DIAGNOSIS — R102 Pelvic and perineal pain: Secondary | ICD-10-CM | POA: Diagnosis not present

## 2015-05-30 DIAGNOSIS — W1781XA Fall down embankment (hill), initial encounter: Secondary | ICD-10-CM | POA: Diagnosis present

## 2015-05-30 HISTORY — DX: Fracture of unspecified parts of lumbosacral spine and pelvis, initial encounter for closed fracture: S32.9XXA

## 2015-05-30 LAB — CBC WITH DIFFERENTIAL/PLATELET
Basophils Absolute: 0 10*3/uL (ref 0.0–0.1)
Basophils Relative: 0 % (ref 0–1)
Eosinophils Absolute: 0.1 10*3/uL (ref 0.0–0.7)
Eosinophils Relative: 0 % (ref 0–5)
HCT: 44 % (ref 36.0–46.0)
Hemoglobin: 14.4 g/dL (ref 12.0–15.0)
LYMPHS PCT: 7 % — AB (ref 12–46)
Lymphs Abs: 1 10*3/uL (ref 0.7–4.0)
MCH: 31.2 pg (ref 26.0–34.0)
MCHC: 32.7 g/dL (ref 30.0–36.0)
MCV: 95.4 fL (ref 78.0–100.0)
MONO ABS: 1 10*3/uL (ref 0.1–1.0)
Monocytes Relative: 7 % (ref 3–12)
Neutro Abs: 12.1 10*3/uL — ABNORMAL HIGH (ref 1.7–7.7)
Neutrophils Relative %: 86 % — ABNORMAL HIGH (ref 43–77)
Platelets: 178 10*3/uL (ref 150–400)
RBC: 4.61 MIL/uL (ref 3.87–5.11)
RDW: 13.3 % (ref 11.5–15.5)
WBC: 14.1 10*3/uL — AB (ref 4.0–10.5)

## 2015-05-30 LAB — BASIC METABOLIC PANEL
ANION GAP: 9 (ref 5–15)
BUN: 24 mg/dL — AB (ref 6–20)
CO2: 24 mmol/L (ref 22–32)
CREATININE: 0.95 mg/dL (ref 0.44–1.00)
Calcium: 8.8 mg/dL — ABNORMAL LOW (ref 8.9–10.3)
Chloride: 108 mmol/L (ref 101–111)
GFR calc Af Amer: >60 mL/min (ref >60)
GFR calc non Af Amer: 55 mL/min — ABNORMAL LOW (ref >60)
Glucose, Bld: 98 mg/dL (ref 65–99)
Potassium: 3.9 mmol/L (ref 3.5–5.1)
SODIUM: 141 mmol/L (ref 135–145)

## 2015-05-30 LAB — URINALYSIS, ROUTINE W REFLEX MICROSCOPIC
Bilirubin Urine: NEGATIVE
GLUCOSE, UA: NEGATIVE mg/dL
Ketones, ur: NEGATIVE mg/dL
NITRITE: POSITIVE — AB
PH: 6.5 (ref 5.0–8.0)
Specific Gravity, Urine: 1.025 (ref 1.005–1.030)
UROBILINOGEN UA: 1 mg/dL (ref 0.0–1.0)

## 2015-05-30 LAB — URINE MICROSCOPIC-ADD ON

## 2015-05-30 LAB — PROTIME-INR
INR: 1.06 (ref 0.00–1.49)
Prothrombin Time: 14 seconds (ref 11.6–15.2)

## 2015-05-30 MED ORDER — MORPHINE SULFATE 4 MG/ML IJ SOLN: 2.0000 mg | Freq: Once | INTRAMUSCULAR | Status: AC

## 2015-05-30 MED ORDER — ONDANSETRON HCL 4 MG/2ML IJ SOLN
4.0000 mg | Freq: Once | INTRAMUSCULAR | Status: AC
  Administered 2015-05-30: 4 mg via INTRAVENOUS
  Filled 2015-05-30: qty 2

## 2015-05-30 MED ORDER — MORPHINE SULFATE 4 MG/ML IJ SOLN
4.0000 mg | Freq: Once | INTRAMUSCULAR | Status: AC
  Administered 2015-05-30: 4 mg via INTRAVENOUS
  Filled 2015-05-30: qty 1

## 2015-05-30 MED ORDER — MORPHINE SULFATE 2 MG/ML IJ SOLN
INTRAMUSCULAR | Status: AC
  Administered 2015-05-30: 2 mg via INTRAVENOUS
  Filled 2015-05-30: qty 1

## 2015-05-30 MED ORDER — DEXTROSE 5 % IV SOLN
1.0000 g | Freq: Once | INTRAVENOUS | Status: AC
  Administered 2015-05-30: 1 g via INTRAVENOUS
  Filled 2015-05-30: qty 10

## 2015-05-30 MED ORDER — FAMOTIDINE 20 MG PO TABS
20.0000 mg | ORAL_TABLET | Freq: Once | ORAL | Status: AC
  Administered 2015-05-30: 20 mg via ORAL

## 2015-05-30 MED ORDER — ONDANSETRON HCL 4 MG/2ML IJ SOLN: 4.0000 mg | Freq: Once | INTRAMUSCULAR | Status: DC

## 2015-05-30 MED ORDER — SODIUM CHLORIDE 0.9 % IV SOLN
INTRAVENOUS | Status: DC
Start: 2015-05-30 — End: 2015-06-01
  Administered 2015-05-31 – 2015-06-01 (×4): via INTRAVENOUS

## 2015-05-30 MED ORDER — FAMOTIDINE 20 MG PO TABS
ORAL_TABLET | ORAL | Status: AC
  Administered 2015-05-30: 20 mg via ORAL
  Filled 2015-05-30: qty 1

## 2015-05-30 NOTE — ED Notes (Signed)
Upon placing pt in bed. Shortening noted to RLE. Pt unable to bear weight on extremity.

## 2015-05-30 NOTE — ED Notes (Signed)
Pt states she slipped and fell down a hill. Pain to right buttock/hip area with hx of fracture to same area in the past.

## 2015-05-30 NOTE — ED Notes (Signed)
Emily Wagner, NT, inserted her Foley Catheter at about 9:44 and it was witnessed by Delrae Alfred, [place].

## 2015-05-30 NOTE — ED Provider Notes (Signed)
History  This chart was scribed for non-physician practitioner, Evalee Jefferson, PA-C,working with Francine Graven, DO, by Marlowe Kays, ED Scribe. This patient was seen in room APA10/APA10 and the patient's care was started at 6:45 PM.  Chief Complaint  Patient presents with  . Fall   The history is provided by the patient.    HPI Comments:  Emily Wagner is a 81 y.o. female who presents to the Emergency Department complaining of severe right buttock pain that started approximately 2 hours ago secondary to slipping and falling on wet gravel while outside and fell down the hill. Landing on her buttocks and sliding down a small incline then had to crawl back up the hill on her hands and knees to get to her car so she could blow the horn for assistance. She reports having hip surgery about 3 years ago to the same hip secondary to a fracture, her surgery was at [facility]. She has had no pain medicine prior to arrival. Palpation and movement makes the pain worse. She denies any alleviating factors. Denies head injury, LOC, HA, neck pain, back pain, numbness, tingling or weakness of the lower extremities, nausea, vomiting or wounds. Her PCP is Dr. Laurance Flatten.  Past Medical History  Diagnosis Date  . Renal insufficiency   . Other and unspecified hyperlipidemia   . Benign hypertensive heart disease without heart failure   . Adjustment disorder with mixed anxiety and depressed mood   . Other dyspnea and respiratory abnormality   . Hematuria   . Interstitial cystitis   . Osteopenia   . ASCVD (arteriosclerotic cardiovascular disease)    Past Surgical History  Procedure Laterality Date  . Fracture surgery      Right hip  . Cholecystectomy    . Hernia repair      Right inguinal  . Abdominal hysterectomy      Cancer and endometriosis   History reviewed. No pertinent family history. History  Substance Use Topics  . Smoking status: Never Smoker   . Smokeless tobacco: Not on file  . Alcohol  Use: No   OB History    No data available     Review of Systems  Constitutional: Negative for fever.  Respiratory: Negative for chest tightness and shortness of breath.   Cardiovascular: Negative for chest pain.  Gastrointestinal: Negative for nausea, vomiting and abdominal pain.  Musculoskeletal: Positive for arthralgias. Negative for myalgias, back pain and neck pain.  Skin: Negative for wound.  Neurological: Negative for syncope, weakness, numbness and headaches.    Allergies  Alendronate sodium; Aspirin free childs; Ciprofloxacin; Codeine; Crestor; Darvon; Flonase; Lipitor; Nitrofurantoin monohyd macro; Pravachol; Prevnar 13; Sulfa antibiotics; Ultram; Welchol; Zetia; and Zocor  Home Medications   Prior to Admission medications   Medication Sig Start Date End Date Taking? Authorizing Provider  aspirin EC 81 MG tablet Take 81 mg by mouth daily.   Yes Historical Provider, MD  calcium carbonate (OS-CAL) 600 MG TABS tablet Take 600 mg by mouth daily with breakfast.   Yes Historical Provider, MD  Cholecalciferol (VITAMIN D) 2000 UNITS CAPS Take 1 capsule by mouth daily.   Yes Historical Provider, MD  fexofenadine-pseudoephedrine (ALLEGRA-D 24) 180-240 MG per 24 hr tablet Take 1 tablet by mouth daily.   Yes Historical Provider, MD  levothyroxine (SYNTHROID, LEVOTHROID) 50 MCG tablet Take 1 tablet (50 mcg total) by mouth daily. 02/10/15  Yes Chipper Herb, MD  LORazepam (ATIVAN) 0.5 MG tablet Take 1 tablet (0.5 mg total) by mouth  2 (two) times daily. Patient taking differently: Take 0.5 mg by mouth 2 (two) times daily as needed for anxiety.  02/10/15  Yes Chipper Herb, MD  Multiple Vitamins-Minerals (PRESERVISION AREDS 2) CAPS Take 1 capsule by mouth daily.   Yes Historical Provider, MD  Polyethyl Glycol-Propyl Glycol (SYSTANE OP) Apply 1 drop to eye daily.   Yes Historical Provider, MD  verapamil (CALAN) 120 MG tablet 1 I/2 tab bid Patient taking differently: Take 120 mg by mouth 3  (three) times daily.  02/10/15  Yes Chipper Herb, MD  ciprofloxacin (CIPRO) 500 MG tablet Take 1 tablet (500 mg total) by mouth daily. As directed Patient not taking: Reported on 10/04/2014 05/14/14   Chipper Herb, MD  escitalopram [facility]) 10 MG tablet 1 daily as directed 09/23/14   Chipper Herb, MD  meclizine (ANTIVERT) 12.5 MG tablet Take 1 tablet (12.5 mg total) by mouth 2 (two) times daily as needed for dizziness. 05/14/14   Chipper Herb, MD   Triage Vitals: BP 174/93 mmHg  Pulse 72  Temp(Src) 98.2 F (36.8 C) (Oral)  Resp 20  Ht 5\' 4"  (1.626 m)  Wt 160 lb (72.576 kg)  BMI 27.45 kg/m2  SpO2 96%  LMP 03/21/1971 Physical Exam  Constitutional: She appears well-developed and well-nourished.  HENT:  Head: Normocephalic and atraumatic. Head is without contusion.  Eyes: EOM are normal. Pupils are equal, round, and reactive to light.  Neck: Normal range of motion.  Cardiovascular: Normal rate, regular rhythm and normal heart sounds.   Pedal pulses equal bilaterally  Pulmonary/Chest: Effort normal and breath sounds normal. No respiratory distress.  Abdominal: Soft. There is no tenderness.  Musculoskeletal: She exhibits tenderness.  Tender to palpation at right mid buttock. Nontender at her lateral thigh, femur and entire lower extremities.  No lumbar pain with palpation.  c spine nontender.    Neurological: She is alert. She has normal strength. She displays normal reflexes. No sensory deficit.  Skin: Skin is warm and dry.  Psychiatric: She has a normal mood and affect.    ED Course  Procedures (including critical care time) DIAGNOSTIC STUDIES: Oxygen Saturation is 96% on RA, adequate by my interpretation.   COORDINATION OF CARE: 6:49 PM- Will order pain medication and nausea medication. Will order imaging. Pt verbalizes understanding and agrees to plan.  Medications  0.9 %  sodium chloride infusion ( Intravenous New Bag/Given 05/31/15 0037)  alum & mag hydroxide-simeth  (MAALOX/MYLANTA) 200-200-20 MG/5ML suspension 30 mL (not administered)  diphenhydrAMINE (BENADRYL) capsule 25 mg (25 mg Oral Given 05/31/15 0037)  HYDROmorphone (DILAUDID) injection 2 mg (2 mg Intravenous Given 05/31/15 0037)  pantoprazole (PROTONIX) EC tablet 40 mg (not administered)  escitalopram (LEXAPRO) tablet 10 mg (not administered)  levothyroxine (SYNTHROID, LEVOTHROID) tablet 50 mcg (not administered)  LORazepam (ATIVAN) tablet 0.5 mg (not administered)  verapamil (CALAN) tablet 120 mg (not administered)  cholecalciferol (VITAMIN D) tablet 2,000 Units (not administered)  sodium chloride 0.9 % injection 3 mL (not administered)  sodium chloride 0.9 % injection 3 mL (not administered)  0.9 %  sodium chloride infusion (not administered)  ondansetron (ZOFRAN) injection 4 mg (not administered)  ondansetron (ZOFRAN) 4 MG/2ML injection (not administered)  morphine 4 MG/ML injection 2 mg (0 mg Intravenous Duplicate 06/03/28 5621)  ondansetron (ZOFRAN) injection 4 mg (4 mg Intravenous Given 05/30/15 1928)  morphine 2 MG/ML injection (2 mg Intravenous Given 05/30/15 1927)  morphine 4 MG/ML injection 4 mg (4 mg Intravenous Given 05/30/15 2042)  famotidine (PEPCID) tablet 20 mg (20 mg Oral Given 05/30/15 2246)  cefTRIAXone (ROCEPHIN) 1 g in dextrose 5 % 50 mL IVPB (1 g Intravenous New Bag/Given 05/30/15 2255)  morphine 4 MG/ML injection 4 mg (4 mg Intravenous Given 05/30/15 2255)    Labs Review Labs Reviewed  URINALYSIS, ROUTINE W REFLEX MICROSCOPIC (NOT AT [facility]) - Abnormal; Notable for the following:    APPearance HAZY (*)    Hgb urine dipstick MODERATE (*)    Protein, ur TRACE (*)    Nitrite POSITIVE (*)    Leukocytes, UA TRACE (*)    All other components within normal limits  CBC WITH DIFFERENTIAL/PLATELET - Abnormal; Notable for the following:    WBC 14.1 (*)    Neutrophils Relative % 86 (*)    Neutro Abs 12.1 (*)    Lymphocytes Relative 7 (*)    All other components within normal limits  BASIC  METABOLIC PANEL - Abnormal; Notable for the following:    BUN 24 (*)    Calcium 8.8 (*)    GFR calc non Af Amer 55 (*)    All other components within normal limits  URINE MICROSCOPIC-ADD ON - Abnormal; Notable for the following:    Bacteria, UA MANY (*)    All other components within normal limits  URINE CULTURE  PROTIME-INR  CBC  COMPREHENSIVE METABOLIC PANEL  PROTIME-INR    Imaging Review Ct Pelvis Wo Contrast  05/30/2015   CLINICAL DATA:  Fell on gravel, with severe right hip and buttock pain. Initial encounter.  EXAM: CT PELVIS WITHOUT CONTRAST  TECHNIQUE: Multidetector CT imaging of the pelvis was performed following the standard protocol without intravenous contrast.  COMPARISON:  Right hip radiographs performed earlier today at 7:07 p.m.  FINDINGS: There is a mildly comminuted fracture extending across the right sacral ala, likely extending into the right sacroiliac joint. This does not appear to involve the underlying bony foramina. No sacroiliac joint space widening is seen.  There is also a mildly displaced slightly comminuted fracture involving the medial right superior pubic ramus and medial right inferior pubic ramus, with likely extension to the pubic symphysis. No additional fractures are seen.  The patient's right-sided dynamic hip screw is unremarkable in appearance, without evidence of loosening. The femoral heads are seated normally within the respective acetabula. There is no evidence of diastasis at the symphysis.  Blood is noted tracking along the right pelvic sidewall, with a 9.2 x 3.6 x 5.4 cm hematoma tracking superior to the right pubic rami fractures, extending about the right side of the space of Retzius into the right lower quadrant.  Scattered diverticulosis is noted along the sigmoid colon, without evidence of diverticulitis. Visualized small bowel loops are grossly unremarkable. Scattered vascular calcifications are seen.  IMPRESSION: 1. Mildly displaced slightly  comminuted fracture involving the medial right superior and inferior pubic rami, with likely extension to the pubic symphysis. 2. Mildly comminuted fracture extending across the right sacral ala, likely extending into the right sacroiliac joint. This does not appear to involve the underlying bony foramina. No sacroiliac joint space widening seen. 3. Blood noted tracking along the right pelvic sidewall, with a 9.2 x 3.6 x 5.4 cm hematoma tracking superior to the right pubic rami fractures, extending about the right side of the space of Retzius into the right lower quadrant. 4. Right-sided dynamic hip screw is unremarkable in appearance, without evidence of loosening. 5. Scattered diverticulosis along the sigmoid colon, without evidence of diverticulitis. 6. Scattered vascular calcifications  seen.  These results were called by telephone at the time of interpretation on 05/30/2015 at 9:50 pm to Dr. Francine Graven, who verbally acknowledged these results.   Electronically Signed   By: Garald Balding M.D.   On: 05/30/2015 21:50   Dg Hip Unilat With Pelvis 2-3 Views Right  05/30/2015   CLINICAL DATA:  Right hip pain after she slipped and fell down a hill today.  EXAM: RIGHT HIP (WITH PELVIS) 2-3 VIEWS  COMPARISON:  None.  FINDINGS: There are fractures of the right inferior and superior pubic rami. The fractures appear to involve the right pubic body as well.  The intertrochanteric fracture of the proximal right femur has completely healed. Compression screw and side plate and screws in place.  IMPRESSION: Acute fractures of the right inferior and superior pubic rami and right pubic body.   Electronically Signed   By: Lorriane Shire M.D.   On: 05/30/2015 19:29     EKG Interpretation None      MDM   Final diagnoses:  Pelvic fracture, closed, initial encounter  UTI (lower urinary tract infection)    Patients labs and/or radiological studies were reviewed and considered during the medical decision making and  disposition process.  Results were also discussed with patient. Pt and daughter at bedside have concerns about pt being able to care for self at home.  Pain is still severe, has received morphine injections, 2 mg, then 4 mg x 2, with brief pain relief.  Pt unable to sit up without severe pain.  Discussed plain film xrays with Dr. Erlinda Hong.  Fractures with stable pelvis.  Advised she can go home from orthopedic viewpoint - he can f/u with her in the office in 1 week.  He does advise getting CT pelvis prior to dc home to rule out hairline sacral fx.  This was completed and call back to Dr. Erlinda Hong to discuss the found sacral fx, also discussed the tracking hematoma.  He advised he will still f/u with her as outpatient as originally planned.  Since she is unable to sit and stand without significant pain, the hospitalist group was contacted.  Dr Darrick Meigs agrees to admit patient, temp orders placed.   I personally performed the services described in this documentation, which was scribed in my presence. The recorded information has been reviewed and is accurate.    Evalee Jefferson, PA-C 05/31/15 [place], DO 06/02/15 1118

## 2015-05-30 NOTE — ED Notes (Signed)
Attempted to ambulate patient per EDP order with two techs at bedside. Patient not able to bare pressure and pain of even sitting up. Patient tearful at this time.

## 2015-05-31 ENCOUNTER — Encounter (HOSPITAL_COMMUNITY): Payer: Self-pay | Admitting: *Deleted

## 2015-05-31 DIAGNOSIS — E039 Hypothyroidism, unspecified: Secondary | ICD-10-CM | POA: Diagnosis present

## 2015-05-31 DIAGNOSIS — F419 Anxiety disorder, unspecified: Secondary | ICD-10-CM | POA: Diagnosis present

## 2015-05-31 DIAGNOSIS — R102 Pelvic and perineal pain: Secondary | ICD-10-CM | POA: Diagnosis present

## 2015-05-31 DIAGNOSIS — I119 Hypertensive heart disease without heart failure: Secondary | ICD-10-CM | POA: Diagnosis present

## 2015-05-31 DIAGNOSIS — N390 Urinary tract infection, site not specified: Secondary | ICD-10-CM | POA: Diagnosis present

## 2015-05-31 DIAGNOSIS — J9601 Acute respiratory failure with hypoxia: Secondary | ICD-10-CM | POA: Diagnosis not present

## 2015-05-31 DIAGNOSIS — S32591A Other specified fracture of right pubis, initial encounter for closed fracture: Secondary | ICD-10-CM | POA: Diagnosis present

## 2015-05-31 DIAGNOSIS — E785 Hyperlipidemia, unspecified: Secondary | ICD-10-CM | POA: Diagnosis present

## 2015-05-31 DIAGNOSIS — K219 Gastro-esophageal reflux disease without esophagitis: Secondary | ICD-10-CM | POA: Diagnosis present

## 2015-05-31 DIAGNOSIS — I10 Essential (primary) hypertension: Secondary | ICD-10-CM | POA: Diagnosis not present

## 2015-05-31 DIAGNOSIS — S300XXA Contusion of lower back and pelvis, initial encounter: Secondary | ICD-10-CM | POA: Diagnosis present

## 2015-05-31 DIAGNOSIS — E860 Dehydration: Secondary | ICD-10-CM | POA: Diagnosis present

## 2015-05-31 DIAGNOSIS — S329XXA Fracture of unspecified parts of lumbosacral spine and pelvis, initial encounter for closed fracture: Secondary | ICD-10-CM | POA: Diagnosis not present

## 2015-05-31 DIAGNOSIS — J9811 Atelectasis: Secondary | ICD-10-CM | POA: Diagnosis present

## 2015-05-31 DIAGNOSIS — S329XXD Fracture of unspecified parts of lumbosacral spine and pelvis, subsequent encounter for fracture with routine healing: Secondary | ICD-10-CM

## 2015-05-31 DIAGNOSIS — F4323 Adjustment disorder with mixed anxiety and depressed mood: Secondary | ICD-10-CM | POA: Diagnosis present

## 2015-05-31 DIAGNOSIS — W1781XA Fall down embankment (hill), initial encounter: Secondary | ICD-10-CM | POA: Diagnosis present

## 2015-05-31 LAB — COMPREHENSIVE METABOLIC PANEL
ALT: 42 U/L (ref 14–54)
AST: 72 U/L — ABNORMAL HIGH (ref 15–41)
Albumin: 3.7 g/dL (ref 3.5–5.0)
Alkaline Phosphatase: 96 U/L (ref 38–126)
Anion gap: 9 (ref 5–15)
BUN: 24 mg/dL — AB (ref 6–20)
CALCIUM: 8.4 mg/dL — AB (ref 8.9–10.3)
CHLORIDE: 108 mmol/L (ref 101–111)
CO2: 25 mmol/L (ref 22–32)
Creatinine, Ser: 1.32 mg/dL — ABNORMAL HIGH (ref 0.44–1.00)
GFR calc Af Amer: 43 mL/min — ABNORMAL LOW (ref >60)
GFR, EST NON AFRICAN AMERICAN: 37 mL/min — AB (ref >60)
GLUCOSE: 197 mg/dL — AB (ref 65–99)
Potassium: 4.2 mmol/L (ref 3.5–5.1)
SODIUM: 142 mmol/L (ref 135–145)
Total Bilirubin: 0.8 mg/dL (ref 0.3–1.2)
Total Protein: 7.1 g/dL (ref 6.5–8.1)

## 2015-05-31 LAB — CBC
HEMATOCRIT: 43.9 % (ref 36.0–46.0)
Hemoglobin: 13.8 g/dL (ref 12.0–15.0)
MCH: 31.4 pg (ref 26.0–34.0)
MCHC: 31.7 g/dL (ref 30.0–36.0)
MCV: 99.3 fL (ref 78.0–100.0)
Platelets: 185 10*3/uL (ref 150–400)
RBC: 4.42 MIL/uL (ref 3.87–5.11)
RDW: 13.7 % (ref 11.5–15.5)
WBC: 13.7 10*3/uL — AB (ref 4.0–10.5)

## 2015-05-31 LAB — PROTIME-INR
INR: 1.07 (ref 0.00–1.49)
Prothrombin Time: 14.1 seconds (ref 11.6–15.2)

## 2015-05-31 MED ORDER — PROMETHAZINE HCL 25 MG/ML IJ SOLN
12.5000 mg | Freq: Four times a day (QID) | INTRAMUSCULAR | Status: DC | PRN
  Administered 2015-05-31: 12.5 mg via INTRAVENOUS
  Filled 2015-05-31: qty 1

## 2015-05-31 MED ORDER — VERAPAMIL HCL 120 MG PO TABS
120.0000 mg | ORAL_TABLET | Freq: Three times a day (TID) | ORAL | Status: DC
  Administered 2015-05-31 – 2015-06-02 (×6): 120 mg via ORAL
  Filled 2015-05-31 (×7): qty 1

## 2015-05-31 MED ORDER — PANTOPRAZOLE SODIUM 40 MG PO TBEC
40.0000 mg | DELAYED_RELEASE_TABLET | Freq: Every day | ORAL | Status: DC
  Administered 2015-05-31 – 2015-06-02 (×3): 40 mg via ORAL
  Filled 2015-05-31 (×3): qty 1

## 2015-05-31 MED ORDER — SODIUM CHLORIDE 0.9 % IJ SOLN
3.0000 mL | Freq: Two times a day (BID) | INTRAMUSCULAR | Status: DC
  Administered 2015-05-31 – 2015-06-02 (×3): 3 mL via INTRAVENOUS

## 2015-05-31 MED ORDER — VITAMIN D 1000 UNITS PO TABS
2000.0000 [IU] | ORAL_TABLET | Freq: Every day | ORAL | Status: DC
  Administered 2015-05-31 – 2015-06-02 (×3): 2000 [IU] via ORAL
  Filled 2015-05-31 (×3): qty 2

## 2015-05-31 MED ORDER — SODIUM CHLORIDE 0.9 % IJ SOLN: 3.0000 mL | INTRAMUSCULAR | Status: DC | PRN

## 2015-05-31 MED ORDER — DEXTROSE 5 % IV SOLN
1.0000 g | INTRAVENOUS | Status: DC
  Administered 2015-05-31 – 2015-06-01 (×2): 1 g via INTRAVENOUS
  Filled 2015-05-31 (×3): qty 10

## 2015-05-31 MED ORDER — ALUM & MAG HYDROXIDE-SIMETH 200-200-20 MG/5ML PO SUSP
30.0000 mL | Freq: Four times a day (QID) | ORAL | Status: DC | PRN
  Administered 2015-06-01: 30 mL via ORAL
  Filled 2015-05-31: qty 30

## 2015-05-31 MED ORDER — ESCITALOPRAM OXALATE 10 MG PO TABS
10.0000 mg | ORAL_TABLET | Freq: Every day | ORAL | Status: DC
  Administered 2015-05-31 – 2015-06-02 (×3): 10 mg via ORAL
  Filled 2015-05-31 (×3): qty 1

## 2015-05-31 MED ORDER — LORAZEPAM 0.5 MG PO TABS: 0.5000 mg | ORAL_TABLET | Freq: Two times a day (BID) | ORAL | Status: DC | PRN

## 2015-05-31 MED ORDER — LEVOTHYROXINE SODIUM 50 MCG PO TABS
50.0000 ug | ORAL_TABLET | Freq: Every day | ORAL | Status: DC
  Administered 2015-05-31 – 2015-06-02 (×3): 50 ug via ORAL
  Filled 2015-05-31 (×3): qty 1

## 2015-05-31 MED ORDER — SODIUM CHLORIDE 0.9 % IV SOLN: 250.0000 mL | INTRAVENOUS | Status: DC | PRN

## 2015-05-31 MED ORDER — HYDROMORPHONE HCL 1 MG/ML IJ SOLN
2.0000 mg | INTRAMUSCULAR | Status: DC | PRN
  Administered 2015-05-31: 2 mg via INTRAVENOUS
  Filled 2015-05-31: qty 2

## 2015-05-31 MED ORDER — HYDROMORPHONE HCL 1 MG/ML IJ SOLN
1.0000 mg | INTRAMUSCULAR | Status: DC | PRN
  Administered 2015-05-31 – 2015-06-02 (×7): 1 mg via INTRAVENOUS
  Filled 2015-05-31 (×7): qty 1

## 2015-05-31 MED ORDER — DIPHENHYDRAMINE HCL 25 MG PO CAPS
25.0000 mg | ORAL_CAPSULE | Freq: Four times a day (QID) | ORAL | Status: DC | PRN
  Administered 2015-05-31 – 2015-06-02 (×6): 25 mg via ORAL
  Filled 2015-05-31 (×7): qty 1

## 2015-05-31 MED ORDER — CEFTRIAXONE SODIUM IN DEXTROSE 20 MG/ML IV SOLN
1.0000 g | INTRAVENOUS | Status: DC
  Filled 2015-05-31: qty 50

## 2015-05-31 MED ORDER — ONDANSETRON HCL 4 MG/2ML IJ SOLN
4.0000 mg | Freq: Four times a day (QID) | INTRAMUSCULAR | Status: DC | PRN
  Administered 2015-05-31: 4 mg via INTRAVENOUS
  Filled 2015-05-31: qty 2

## 2015-05-31 MED ORDER — ONDANSETRON HCL 4 MG/2ML IJ SOLN
INTRAMUSCULAR | Status: AC
  Administered 2015-05-31: 02:00:00
  Filled 2015-05-31: qty 2
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
## 2015-05-31 NOTE — Progress Notes (Signed)
Patient seen and examined. Admitted earlier today after a mechanical fall at home which resulted in pelvic and pubic rami fractures. Has been evaluated by physical therapy with the belief that she will most likely need SNF. Continue to improve pain control, will continue to follow.  Domingo Mend, MD [facility] Pager: [phone]

## 2015-05-31 NOTE — Progress Notes (Signed)
Notified MD pt had nausea and vomiting episode. Ordered Zofran 4 mg  Q6hr PRN. Will administer.
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
## 2015-05-31 NOTE — Clinical Social Work Placement (Signed)
   CLINICAL SOCIAL WORK PLACEMENT  NOTE  Date:  05/31/2015  Patient Details  Name: Emily Wagner MRN: [id number] Date of Birth: 10-28-1933  Clinical Social Work is seeking post-discharge placement for this patient at the [place] level of care (*CSW will initial, date and re-position this form in  chart as items are completed):  Yes   Patient/family provided with [facility] Work Department's list of facilities offering this level of care within the geographic area requested by the patient (or if unable, by the patient's family).  Yes   Patient/family informed of their freedom to choose among providers that offer the needed level of care, that participate in Medicare, Medicaid or managed care program needed by the patient, have an available bed and are willing to accept the patient.  Yes   Patient/family informed of [facility]'s ownership interest in [facility] and [facility], as well as of the fact that they are under no obligation to receive care at these facilities.  PASRR submitted to EDS on       PASRR number received on       Existing PASRR number confirmed on 05/31/15     FL2 transmitted to all facilities in geographic area requested by pt/family on 05/31/15     FL2 transmitted to all facilities within larger geographic area on       Patient informed that his/her managed care company has contracts with or will negotiate with certain facilities, including the following:            Patient/family informed of bed offers received.  Patient chooses bed at       Physician recommends and patient chooses bed at      Patient to be transferred to   on  .  Patient to be transferred to facility by       Patient family notified on   of transfer.  Name of family member notified:        PHYSICIAN       Additional Comment:    _______________________________________________ [name], LCSW 05/31/2015, 2:47 PM [phone]
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
## 2015-05-31 NOTE — Clinical Social Work Note (Signed)
Clinical Social Work Assessment  Patient Details  Name: Emily Wagner MRN: [id number] Date of Birth: 06-Apr-1933  Date of referral:  05/31/15               Reason for consult:  Facility Placement                Permission sought to share information with:    Permission granted to share information::     Name::        Agency::     Relationship::     Contact Information:     Housing/Transportation Living arrangements for the past 2 months:  Single Family Home Source of Information:  Patient Patient Interpreter Needed:  None Criminal Activity/Legal Involvement Pertinent to Current Situation/Hospitalization:  No - Comment as needed Significant Relationships:  Adult Children Lives with:    Do you feel safe going back to the place where you live?  Yes Need for family participation in patient care:  Yes (Comment)  Care giving concerns:  Patient had been providing self care without assistance prior to her fall.    Social Worker assessment / plan:  Patient advised that she lives in the home with her disabled daughter whom she provides care for.  She stated that since her fall her other daughter and grandson will provide care to her daughter. Patient advised that at baseline she walks unassisted and completes ADL's unassisted. CSW discussed SNF option for rehab purposes and provided a SNF list.  Patient advised that she would go to [facility] or [facility] for rehab only because there are on the way home from work for her daughter visit and these locations will be easier on her daughter.    Employment status:  Retired Nurse, adult PT Recommendations:  Not assessed at this time Information / Referral to community resources:  [place]  Patient/Family's Response to care: Patient agreeable to SNF.   Patient/Family's Understanding of and Emotional Response to Diagnosis, Current Treatment, and Prognosis:  Patient understands emotional  response of current diagnosis, treatment and prognosis.    Emotional Assessment Appearance:  Developmentally appropriate Attitude/Demeanor/Rapport:   (Cooperative) Affect (typically observed):  Calm Orientation:  Oriented to Self, Oriented to Place, Oriented to  Time, Oriented to Situation Alcohol / Substance use:  Not Applicable Psych involvement (Current and /or in the community):  No (Comment)  Discharge Needs  Concerns to be addressed:  Discharge Planning Concerns Readmission within the last 30 days:  No Current discharge risk:  None Barriers to Discharge:  No Barriers Identified   [name], LCSW 05/31/2015, 2:42 PM [phone]

## 2015-05-31 NOTE — Progress Notes (Signed)
Notified MD pt is still vomiting and complaining of motion sickness due to bed. MD ordered 12.5 mg of Phenergan. Will administer and continue to monitor pt.

## 2015-05-31 NOTE — Progress Notes (Signed)
MD (Dr. Darrick Meigs) at bedside. MD ordered Dilaudid 2mg  prn 4 hrs, Benadryl, Maalox, Protonix, and EKG for patient.

## 2015-05-31 NOTE — H&P (Addendum)
PCP:   Redge Gainer, MD   Chief Complaint:  Fall  HPI: 81 year old female who   has a past medical history of Renal insufficiency; Other and unspecified hyperlipidemia; Benign hypertensive heart disease without heart failure; Adjustment disorder with mixed anxiety and depressed mood; Other dyspnea and respiratory abnormality; Hematuria; Interstitial cystitis; Osteopenia; and ASCVD (arteriosclerotic cardiovascular disease). Today came to the hospital after patient had fall and complained of severe right buttock pain. Patient says that today she was in her backyard and she slipped and fell backward down a hill landing on the gravel rocks. The she had to crawl back up the hill on her hands and knees. Patient denies hitting her head. No history of passing out. No nausea vomiting or diarrhea. No chest pain or shortness of breath. In the ED patient was found to have pelvic fracture CT of the  Pelvis showed mildly displaced slightly comminuted fracture involving the medial right superior and inferior pubic rami with likely extension to the pubic symphysis. Also blood noted tracking in the right pelvic sidewall with a [phone] cm hematoma tracking superior to these right pubic rami fracture.   Allergies:   Allergies  Allergen Reactions  . Alendronate Sodium   . Aspirin Free Childs [Acetaminophen]   . Ciprofloxacin   . Codeine   . Crestor [Rosuvastatin Calcium]   . Darvon   . Flonase [Fluticasone Propionate] Other (See Comments)    headache  . Lipitor [Atorvastatin Calcium]   . Nitrofurantoin Monohyd Macro Other (See Comments)    elevated liver test  . Pravachol   . Prevnar 13 [Pneumococcal 13-Val Conj Vacc]     Nerve damage in left arm  . Sulfa Antibiotics   . Ultram [Tramadol Hcl] Itching  . Welchol [Colesevelam Hcl]   . Zetia [Ezetimibe]   . Zocor [Simvastatin]       Past Medical History  Diagnosis Date  . Renal insufficiency   . Other and unspecified hyperlipidemia   .  Benign hypertensive heart disease without heart failure   . Adjustment disorder with mixed anxiety and depressed mood   . Other dyspnea and respiratory abnormality   . Hematuria   . Interstitial cystitis   . Osteopenia   . ASCVD (arteriosclerotic cardiovascular disease)     Past Surgical History  Procedure Laterality Date  . Fracture surgery      Right hip  . Cholecystectomy    . Hernia repair      Right inguinal  . Abdominal hysterectomy      Cancer and endometriosis    Prior to Admission medications   Medication Sig Start Date End Date Taking? Authorizing Provider  aspirin EC 81 MG tablet Take 81 mg by mouth daily.   Yes Historical Provider, MD  calcium carbonate (OS-CAL) 600 MG TABS tablet Take 600 mg by mouth daily with breakfast.   Yes Historical Provider, MD  Cholecalciferol (VITAMIN D) 2000 UNITS CAPS Take 1 capsule by mouth daily.   Yes Historical Provider, MD  fexofenadine-pseudoephedrine (ALLEGRA-D 24) 180-240 MG per 24 hr tablet Take 1 tablet by mouth daily.   Yes Historical Provider, MD  levothyroxine (SYNTHROID, LEVOTHROID) 50 MCG tablet Take 1 tablet (50 mcg total) by mouth daily. 02/10/15  Yes Chipper Herb, MD  LORazepam (ATIVAN) 0.5 MG tablet Take 1 tablet (0.5 mg total) by mouth 2 (two) times daily. Patient taking differently: Take 0.5 mg by mouth 2 (two) times daily as needed for anxiety.  02/10/15  Yes Chipper Herb, MD  Multiple Vitamins-Minerals (PRESERVISION AREDS 2) CAPS Take 1 capsule by mouth daily.   Yes Historical Provider, MD  Polyethyl Glycol-Propyl Glycol (SYSTANE OP) Apply 1 drop to eye daily.   Yes Historical Provider, MD  verapamil (CALAN) 120 MG tablet 1 I/2 tab bid Patient taking differently: Take 120 mg by mouth 3 (three) times daily.  02/10/15  Yes Chipper Herb, MD  ciprofloxacin (CIPRO) 500 MG tablet Take 1 tablet (500 mg total) by mouth daily. As directed Patient not taking: Reported on 10/04/2014 05/14/14   Chipper Herb, MD  escitalopram  [facility]) 10 MG tablet 1 daily as directed 09/23/14   Chipper Herb, MD  meclizine (ANTIVERT) 12.5 MG tablet Take 1 tablet (12.5 mg total) by mouth 2 (two) times daily as needed for dizziness. 05/14/14   Chipper Herb, MD    Social History:  reports that she has never smoked. She does not have any smokeless tobacco history on file. She reports that she does not drink alcohol or use illicit drugs.  Family history-noncontributory  Filed Weights   05/30/15 1808 05/30/15 2337  Weight: 72.576 kg (160 lb) 74.571 kg (164 lb 6.4 oz)      Review of Systems:  As the history of present illness, rest of the review of systems negative   Physical Exam: Blood pressure 165/86, pulse 71, temperature 98.5 F (36.9 C), temperature source Oral, resp. rate 18, height 5\' 4"  (1.626 m), weight 74.571 kg (164 lb 6.4 oz), last menstrual period 03/21/1971, SpO2 93 %. Constitutional:   Patient is a well-developed and well-nourished female in no acute distress and cooperative with exam. Head: Normocephalic and atraumatic Mouth: Mucus membranes moist Eyes: PERRL, EOMI, conjunctivae normal Neck: Supple, No Thyromegaly Cardiovascular: RRR, S1 normal, S2 normal Pulmonary/Chest: CTAB, no wheezes, rales, or rhonchi Abdominal: Soft. Non-tender, non-distended, bowel sounds are normal, no masses, organomegaly, or guarding present.  Neurological: A&O x3, Strength is normal and symmetric bilaterally, cranial nerve II-XII are grossly intact, no focal motor deficit, sensory intact to light touch bilaterally.  Extremities : Tenderness in the right buttock  Labs on Admission:  Basic Metabolic Panel:  Recent Labs Lab 05/30/15 1900  NA 141  K 3.9  CL 108  CO2 24  GLUCOSE 98  BUN 24*  CREATININE 0.95  CALCIUM 8.8*   Liver Function Tests: No results for input(s): AST, ALT, ALKPHOS, BILITOT, PROT, ALBUMIN in the last 168 hours. No results for input(s): LIPASE, AMYLASE in the last 168 hours. No results for  input(s): AMMONIA in the last 168 hours. CBC:  Recent Labs Lab 05/30/15 1900  WBC 14.1*  NEUTROABS 12.1*  HGB 14.4  HCT 44.0  MCV 95.4  PLT 178    Radiological Exams on Admission: Ct Pelvis Wo Contrast  05/30/2015   CLINICAL DATA:  [place] on gravel, with severe right hip and buttock pain. Initial encounter.  EXAM: CT PELVIS WITHOUT CONTRAST  TECHNIQUE: Multidetector CT imaging of the pelvis was performed following the standard protocol without intravenous contrast.  COMPARISON:  Right hip radiographs performed earlier today at 7:07 p.m.  FINDINGS: There is a mildly comminuted fracture extending across the right sacral ala, likely extending into the right sacroiliac joint. This does not appear to involve the underlying bony foramina. No sacroiliac joint space widening is seen.  There is also a mildly displaced slightly comminuted fracture involving the medial right superior pubic ramus and medial right inferior pubic ramus, with likely extension to the pubic symphysis. No additional fractures are  seen.  The patient's right-sided dynamic hip screw is unremarkable in appearance, without evidence of loosening. The femoral heads are seated normally within the respective acetabula. There is no evidence of diastasis at the symphysis.  Blood is noted tracking along the right pelvic sidewall, with a 9.2 x 3.6 x 5.4 cm hematoma tracking superior to the right pubic rami fractures, extending about the right side of the space of Retzius into the right lower quadrant.  Scattered diverticulosis is noted along the sigmoid colon, without evidence of diverticulitis. Visualized small bowel loops are grossly unremarkable. Scattered vascular calcifications are seen.  IMPRESSION: 1. Mildly displaced slightly comminuted fracture involving the medial right superior and inferior pubic rami, with likely extension to the pubic symphysis. 2. Mildly comminuted fracture extending across the right sacral ala, likely extending into  the right sacroiliac joint. This does not appear to involve the underlying bony foramina. No sacroiliac joint space widening seen. 3. Blood noted tracking along the right pelvic sidewall, with a 9.2 x 3.6 x 5.4 cm hematoma tracking superior to the right pubic rami fractures, extending about the right side of the space of Retzius into the right lower quadrant. 4. Right-sided dynamic hip screw is unremarkable in appearance, without evidence of loosening. 5. Scattered diverticulosis along the sigmoid colon, without evidence of diverticulitis. 6. Scattered vascular calcifications seen.  These results were called by telephone at the time of interpretation on 05/30/2015 at 9:50 pm to Dr. Francine Graven, who verbally acknowledged these results.   Electronically Signed   By: Garald Balding M.D.   On: 05/30/2015 21:50   Dg Hip Unilat With Pelvis 2-3 Views Right  05/30/2015   CLINICAL DATA:  Right hip pain after she slipped and fell down a hill today.  EXAM: RIGHT HIP (WITH PELVIS) 2-3 VIEWS  COMPARISON:  None.  FINDINGS: There are fractures of the right inferior and superior pubic rami. The fractures appear to involve the right pubic body as well.  The intertrochanteric fracture of the proximal right femur has completely healed. Compression screw and side plate and screws in place.  IMPRESSION: Acute fractures of the right inferior and superior pubic rami and right pubic body.   Electronically Signed   By: Lorriane Shire M.D.   On: 05/30/2015 19:29    EKG: Independently reviewed. Normal sinus rhythm, nonspecific ST changes   Assessment/Plan Active Problems:   Essential hypertension   Pelvic fracture  Pelvic fracture  Orthopedic surgeon Dr. Erlinda Hong was called by the ED physician, who recommended conservative management and follow-up as outpatient. Will admit the patient for pain control, obtain a formal consultation with orthopedics in a.m. Start Dilaudid 2 mg every 4 hours when necessary.  UTI Patient found to  have abnormal UA with past nitrite, too numerous to count RBC, WBC. Will start Rocephin and follow urine culture results  Pelvic hematoma CT pelvis shows right sidewall hematoma above the right pubic ramus fracture. Will hold aspirin.   Essential hypertension Continue verapamil 120 g by mouth 3 times a day  GERD Start Protonix, Maalox when necessary for heartburn.  Hypothyroidism Continue Synthroid  DVT prophylaxis SCDs   Code status:Full code  Family discussion:No family at bedside   Time Spent on Admission: 60 minutes  [facility] Pager: [phone] 05/31/2015, 12:14 AM  If 7PM-7AM, please contact night-coverage  [email or web address]  Password [email or web address]

## 2015-05-31 NOTE — Care Management Note (Signed)
Case Management Note  Patient Details  Name: Emily Wagner MRN: [id number] Date of Birth: Aug 06, 1933  Expected Discharge Date:                  Expected Discharge Plan:  [place]  In-House Referral:  Clinical Social Work  Discharge planning Services  CM Consult  Post Acute Care Choice:  NA Choice offered to:  NA  DME Arranged:    DME Agency:     HH Arranged:    [place] Agency:     Status of Service:  In process, will continue to follow  Medicare Important Message Given:    Date Medicare IM Given:    Medicare IM give by:    Date Additional Medicare IM Given:    Additional Medicare Important Message give by:     If discussed at [place] of Stay Meetings, dates discussed:    Additional Comments: Patient is from home, admitted after falling and fx pelvis. PT has recommended SNF. CSW is aware and will see patient to begin placement. If patient returns home, she has states she will needs a walker. CM will cont to follow for DC planning.  Sherald Barge, RN 05/31/2015, 11:55 AM

## 2015-06-01 ENCOUNTER — Telehealth: Payer: Self-pay | Admitting: Family Medicine

## 2015-06-01 LAB — CBC
HCT: 40.4 % (ref 36.0–46.0)
Hemoglobin: 12.8 g/dL (ref 12.0–15.0)
MCH: 31.4 pg (ref 26.0–34.0)
MCHC: 31.7 g/dL (ref 30.0–36.0)
MCV: 99 fL (ref 78.0–100.0)
Platelets: 147 10*3/uL — ABNORMAL LOW (ref 150–400)
RBC: 4.08 MIL/uL (ref 3.87–5.11)
RDW: 13.7 % (ref 11.5–15.5)
WBC: 11 10*3/uL — ABNORMAL HIGH (ref 4.0–10.5)

## 2015-06-01 LAB — BASIC METABOLIC PANEL
Anion gap: 8 (ref 5–15)
BUN: 32 mg/dL — ABNORMAL HIGH (ref 6–20)
CALCIUM: 7.9 mg/dL — AB (ref 8.9–10.3)
CO2: 23 mmol/L (ref 22–32)
CREATININE: 1.38 mg/dL — AB (ref 0.44–1.00)
Chloride: 109 mmol/L (ref 101–111)
GFR, EST AFRICAN AMERICAN: 41 mL/min — AB (ref >60)
GFR, EST NON AFRICAN AMERICAN: 35 mL/min — AB (ref >60)
Glucose, Bld: 115 mg/dL — ABNORMAL HIGH (ref 65–99)
Potassium: 4.4 mmol/L (ref 3.5–5.1)
SODIUM: 140 mmol/L (ref 135–145)

## 2015-06-01 NOTE — Progress Notes (Signed)
[facility] PROGRESS NOTE  Darleth Eustache FVC:[id number] DOB: 13-Aug-1933 DOA: 05/30/2015 PCP: Redge Gainer, MD  Interim progress note Patient admitted 7/3 following a mechanical fall at home during which she suffered a mildly displaced comminuted fracture involving the medial right superior and inferior pubic rami with extension into the pubic symphysis. Has been seen by orthopedics and physical therapy, plan for SNF once bed available.  Assessment/Plan: Pelvic fracture -Plan for no operative, conservative management at present. -Seen in consultation by orthopedics, Dr. Luna Glasgow. -Plan for SNF once bed available and insurance approval received.  UTI -Continue Rocephin pending culture data.  Hypothyroidism -Continue Synthroid.  Hypertension -Reasonable control, continue home antihypertensives regimen.  Code Status: Full code Family Communication: Patient evidently  Disposition Plan: To SNF once bed available   Consultants:  Orthopedics, Dr. Luna Glasgow   Antibiotics:  Rocephin   Subjective: Still with pain to the buttock area, denies chest pain or shortness of breath.  Objective: Filed Vitals:   05/31/15 0651 05/31/15 1422 05/31/15 2126 06/01/15 0503  BP:  127/79 139/85 137/78  Pulse:  88 90 91  Temp:  98.2 F (36.8 C) 98.1 F (36.7 C) 98.5 F (36.9 C)  TempSrc:  Oral Oral Oral  Resp:  16 16 16   Height:      Weight:      SpO2: 96% 95% 90% 88%    Intake/Output Summary (Last 24 hours) at 06/01/15 1333 Last data filed at 06/01/15 0600  Gross per 24 hour  Intake 1976.25 ml  Output    800 ml  Net 1176.25 ml   Filed Weights   05/30/15 1808 05/30/15 2337  Weight: 72.576 kg (160 lb) 74.571 kg (164 lb 6.4 oz)    Exam:   General:  Alert, awake, oriented 3  Cardiovascular: Regular rate and rhythm, no murmurs, rubs or gallops  Respiratory: Clear to auscultation bilaterally  Abdomen: Soft, nontender, nondistended, positive bowel sounds, no  masses or organomegaly noted  Extremities: No clubbing, cyanosis or edema, positive pulses   Neurologic:  Moves all 4 spontaneously  Data Reviewed: Basic Metabolic Panel:  Recent Labs Lab 05/30/15 1900 05/31/15 0626 06/01/15 0604  NA 141 142 140  K 3.9 4.2 4.4  CL 108 108 109  CO2 24 25 23   GLUCOSE 98 197* 115*  BUN 24* 24* 32*  CREATININE 0.95 1.32* 1.38*  CALCIUM 8.8* 8.4* 7.9*   Liver Function Tests:  Recent Labs Lab 05/31/15 0626  AST 72*  ALT 42  ALKPHOS 96  BILITOT 0.8  PROT 7.1  ALBUMIN 3.7   No results for input(s): LIPASE, AMYLASE in the last 168 hours. No results for input(s): AMMONIA in the last 168 hours. CBC:  Recent Labs Lab 05/30/15 1900 05/31/15 0626 06/01/15 0604  WBC 14.1* 13.7* 11.0*  NEUTROABS 12.1*  --   --   HGB 14.4 13.8 12.8  HCT 44.0 43.9 40.4  MCV 95.4 99.3 99.0  PLT 178 185 147*   Cardiac Enzymes: No results for input(s): CKTOTAL, CKMB, CKMBINDEX, TROPONINI in the last 168 hours. BNP (last 3 results) No results for input(s): BNP in the last 8760 hours.  ProBNP (last 3 results) No results for input(s): PROBNP in the last 8760 hours.  CBG: No results for input(s): GLUCAP in the last 168 hours.  No results found for this or any previous visit (from the past 240 hour(s)).   Studies: Ct Pelvis Wo Contrast  05/30/2015   CLINICAL DATA:  [place] on gravel, with severe  right hip and buttock pain. Initial encounter.  EXAM: CT PELVIS WITHOUT CONTRAST  TECHNIQUE: Multidetector CT imaging of the pelvis was performed following the standard protocol without intravenous contrast.  COMPARISON:  Right hip radiographs performed earlier today at 7:07 p.m.  FINDINGS: There is a mildly comminuted fracture extending across the right sacral ala, likely extending into the right sacroiliac joint. This does not appear to involve the underlying bony foramina. No sacroiliac joint space widening is seen.  There is also a mildly displaced slightly comminuted  fracture involving the medial right superior pubic ramus and medial right inferior pubic ramus, with likely extension to the pubic symphysis. No additional fractures are seen.  The patient's right-sided dynamic hip screw is unremarkable in appearance, without evidence of loosening. The femoral heads are seated normally within the respective acetabula. There is no evidence of diastasis at the symphysis.  Blood is noted tracking along the right pelvic sidewall, with a 9.2 x 3.6 x 5.4 cm hematoma tracking superior to the right pubic rami fractures, extending about the right side of the space of Retzius into the right lower quadrant.  Scattered diverticulosis is noted along the sigmoid colon, without evidence of diverticulitis. Visualized small bowel loops are grossly unremarkable. Scattered vascular calcifications are seen.  IMPRESSION: 1. Mildly displaced slightly comminuted fracture involving the medial right superior and inferior pubic rami, with likely extension to the pubic symphysis. 2. Mildly comminuted fracture extending across the right sacral ala, likely extending into the right sacroiliac joint. This does not appear to involve the underlying bony foramina. No sacroiliac joint space widening seen. 3. Blood noted tracking along the right pelvic sidewall, with a 9.2 x 3.6 x 5.4 cm hematoma tracking superior to the right pubic rami fractures, extending about the right side of the space of Retzius into the right lower quadrant. 4. Right-sided dynamic hip screw is unremarkable in appearance, without evidence of loosening. 5. Scattered diverticulosis along the sigmoid colon, without evidence of diverticulitis. 6. Scattered vascular calcifications seen.  These results were called by telephone at the time of interpretation on 05/30/2015 at 9:50 pm to Dr. Francine Graven, who verbally acknowledged these results.   Electronically Signed   By: Garald Balding M.D.   On: 05/30/2015 21:50   Dg Hip Unilat With Pelvis 2-3  Views Right  05/30/2015   CLINICAL DATA:  Right hip pain after she slipped and fell down a hill today.  EXAM: RIGHT HIP (WITH PELVIS) 2-3 VIEWS  COMPARISON:  None.  FINDINGS: There are fractures of the right inferior and superior pubic rami. The fractures appear to involve the right pubic body as well.  The intertrochanteric fracture of the proximal right femur has completely healed. Compression screw and side plate and screws in place.  IMPRESSION: Acute fractures of the right inferior and superior pubic rami and right pubic body.   Electronically Signed   By: Lorriane Shire M.D.   On: 05/30/2015 19:29    Scheduled Meds: . cefTRIAXone (ROCEPHIN)  IV  1 g Intravenous Q24H  . cholecalciferol  2,000 Units Oral Daily  . escitalopram  10 mg Oral Daily  . levothyroxine  50 mcg Oral QAC breakfast  . pantoprazole  40 mg Oral Daily  . sodium chloride  3 mL Intravenous Q12H  . verapamil  120 mg Oral TID   Continuous Infusions: . sodium chloride 75 mL/hr at 06/01/15 1244    Active Problems:   Essential hypertension   Pelvic fracture    Time spent:  25 minutes. Greater than 50% of this time was spent in direct contact with the patient coordinating care.    Lelon Frohlich  [facility] Pager [phone]  If 7PM-7AM, please contact night-coverage at [email or web address], password [facility] 06/01/2015, 1:33 PM  LOS: 1 day

## 2015-06-01 NOTE — Telephone Encounter (Signed)
[place] comes here once monthly--- I am not sure when they are here the next time

## 2015-06-01 NOTE — Care Management Note (Signed)
Case Management Note  Patient Details  Name: Emily Wagner MRN: [id number] Date of Birth: 1933-10-07  Expected Discharge Date:                  Expected Discharge Plan:  Skilled Nursing Facility  In-House Referral:  Clinical Social Work  Discharge planning Services  CM Consult  Post Acute Care Choice:  NA Choice offered to:  NA  DME Arranged:    DME Agency:     HH Arranged:    [place] Agency:     Status of Service:  Completed, signed off  Medicare Important Message Given:    Date Medicare IM Given:    Medicare IM give by:    Date Additional Medicare IM Given:    Additional Medicare Important Message give by:     If discussed at [place] of Stay Meetings, dates discussed:    Additional Comments: CSW has seen pt and has found SNF for pt pending ins auth. Discharge anticipated in next 24 hours. No CM needs.  Sherald Barge, RN 06/01/2015, 10:57 AM

## 2015-06-01 NOTE — Telephone Encounter (Signed)
Notified that there isn't an ortho at the specialty clinic.

## 2015-06-01 NOTE — Progress Notes (Signed)
Physical Therapy Treatment Patient Details Name: Emily Wagner MRN: [id number] DOB: 09-28-1933 Today's Date: 06/01/2015    History of Present Illness 81 year old female who  has a past medical history of Renal insufficiency; Other and unspecified hyperlipidemia; Benign hypertensive heart disease without heart failure; Adjustment disorder with mixed anxiety and depressed mood; Other dyspnea and respiratory abnormality; Hematuria; Interstitial cystitis; Osteopenia; and ASCVD (arteriosclerotic cardiovascular disease).  Today came to the hospital after patient had fall and complained of severe right buttock pain. Patient says that today she was in her backyard and she slipped and fell backward down a hill landing on the gravel rocks. The she had to crawl back up the hill on her hands and knees. Patient denies hitting her head. No history of passing out. No nausea vomiting or diarrhea. No chest pain or shortness of breath. In the ED patient was found to have pelvic fracture CT of the Pelvis showed mildly displaced slightly comminuted fracture involving the medial right superior and inferior pubic rami with likely extension to the pubic symphysis. Also blood noted tracking in the right pelvic sidewall with a [phone] cm hematoma tracking superior to these right pubic rami fracture.    PT Comments    Pt tolerating treatment session well, motivated and able to complete most of PT sesssion as planned, mostly limited by pain. Pt continues to make progress toward goals as evidenced by improved pain control, tolerance to mobility , and tolerance to HEP. Pt's greatest limitation continues to be pain c AROM of RLE which continues to limit ability to perform most mobility and self-care at baseline function. Patient presenting with impairment of strength, pain, range of motion, balance, and activity tolerance, limiting ability to perform ADL and mobility tasks at  baseline level of function. Patient will benefit  from skilled intervention to address the above impairments and limitations, in order to restore to prior level of function, improve patient safety upon discharge, and to decrease caregiver burden.    Follow Up Recommendations  SNF     Equipment Recommendations  None recommended by PT    Recommendations for Other Services       Precautions / Restrictions Precautions Precautions: Fall Restrictions Weight Bearing Restrictions: No    Mobility  Bed Mobility Overal bed mobility: Modified Independent (Moderate effort and pain, but able to perform indep. ) Bed Mobility: Sidelying to Sit   Sidelying to sit: Modified independent (Device/Increase time)       General bed mobility comments: limited by pain   Transfers Overall transfer level: Needs assistance Equipment used: Rolling walker (2 wheeled) Transfers: Sit to/from Stand Sit to Stand: Min guard;From elevated surface         General transfer comment: Performed 2x.   Ambulation/Gait Ambulation/Gait assistance: Min guard Ambulation Distance (Feet): 26 Feet (16 feet, rest, 10 feet) Assistive device: Rolling walker (2 wheeled) Gait Pattern/deviations: Decreased step length - left;Antalgic Gait velocity: very slow  Gait velocity interpretation: <1.8 ft/sec, indicative of risk for recurrent falls General Gait Details: Mod use of WB through BUE/RW. taking small antalgic steps.    Stairs            Wheelchair Mobility    Modified Rankin (Stroke Patients Only)       Balance Overall balance assessment: Modified Independent;No apparent balance deficits (not formally assessed);History of Falls  Cognition Arousal/Alertness: Awake/alert Behavior During Therapy: WFL for tasks assessed/performed Overall Cognitive Status: Within Functional Limits for tasks assessed                      Exercises General Exercises - Lower Extremity Ankle Circles/Pumps:  AROM;Right;Supine;15 reps Gluteal Sets: AROM;Both;15 reps;Supine;Strengthening Short Arc Quad: AROM;Both;Strengthening;15 reps;Supine Heel Slides: AROM;Strengthening;Both;15 reps;Supine Hip ABduction/ADduction: AAROM;Right;15 reps;Supine Straight Leg Raises: AROM;Strengthening;Right;10 reps;Supine (Min-Mod Assist to perform)    General Comments        Pertinent Vitals/Pain Pain Assessment: 0-10 Pain Score: 8  (Reporting 0/10 at entry, 8/10 c amb, and 5/10 resting thereafter. ) Pain Location: R pelvis, anterior and posterior.  Pain Intervention(s): Limited activity within patient's tolerance;Premedicated before session;Monitored during session    Home Living                      Prior Function            PT Goals (current goals can now be found in the care plan section) Acute Rehab PT Goals Patient Stated Goal: to get stronger and walk  PT Goal Formulation: With patient Time For Goal Achievement: 06/14/15 Potential to Achieve Goals: Good Progress towards PT goals: Progressing toward goals    Frequency  Min 5X/week    PT Plan Current plan remains appropriate    Co-evaluation             End of Session Equipment Utilized During Treatment: Gait belt Activity Tolerance: Patient tolerated treatment well;Patient limited by pain Patient left: in chair;with call bell/phone within reach;with chair alarm set     Time: 6962-9528 PT Time Calculation (min) (ACUTE ONLY): 35 min  Charges:  $Gait Training: 8-22 mins $Therapeutic Exercise: 8-22 mins                    G Codes:      Gale Klar C 06-07-15, 3:12 PM  3:14 PM  Etta Grandchild, PT, DPT [place] License # [id number]

## 2015-06-01 NOTE — Telephone Encounter (Signed)
I advised them of that but I don't think that is going to suite her needs

## 2015-06-01 NOTE — Clinical Social Work Placement (Signed)
   CLINICAL SOCIAL WORK PLACEMENT  NOTE  Date:  06/01/2015  Patient Details  Name: Emily Wagner MRN: [id number] Date of Birth: [birth year]  Clinical Social Work is seeking post-discharge placement for this patient at the [place] level of care (*CSW will initial, date and re-position this form in  chart as items are completed):  Yes   Patient/family provided with [facility] Work Department's list of facilities offering this level of care within the geographic area requested by the patient (or if unable, by the patient's family).  Yes   Patient/family informed of their freedom to choose among providers that offer the needed level of care, that participate in Medicare, Medicaid or managed care program needed by the patient, have an available bed and are willing to accept the patient.  Yes   Patient/family informed of [facility]'s ownership interest in [facility] and [facility], as well as of the fact that they are under no obligation to receive care at these facilities.  PASRR submitted to EDS on       PASRR number received on       Existing PASRR number confirmed on 05/31/15     FL2 transmitted to all facilities in geographic area requested by pt/family on 05/31/15     FL2 transmitted to all facilities within larger geographic area on       Patient informed that his/her managed care company has contracts with or will negotiate with certain facilities, including the following:        Yes   Patient/family informed of bed offers received.  Patient chooses bed at [facility]     Physician recommends and patient chooses bed at      Patient to be transferred to   on  .  Patient to be transferred to facility by       Patient family notified on   of transfer.  Name of family member notified:        PHYSICIAN       Additional Comment:    _______________________________________________ [name], LCSW 06/01/2015, 11:11  AM

## 2015-06-02 ENCOUNTER — Inpatient Hospital Stay (HOSPITAL_COMMUNITY): Payer: Medicare Other

## 2015-06-02 DIAGNOSIS — J9601 Acute respiratory failure with hypoxia: Secondary | ICD-10-CM

## 2015-06-02 DIAGNOSIS — S329XXA Fracture of unspecified parts of lumbosacral spine and pelvis, initial encounter for closed fracture: Secondary | ICD-10-CM

## 2015-06-02 DIAGNOSIS — N390 Urinary tract infection, site not specified: Secondary | ICD-10-CM

## 2015-06-02 LAB — BASIC METABOLIC PANEL
Anion gap: 7 (ref 5–15)
BUN: 41 mg/dL — ABNORMAL HIGH (ref 6–20)
CALCIUM: 8.7 mg/dL — AB (ref 8.9–10.3)
CHLORIDE: 103 mmol/L (ref 101–111)
CO2: 26 mmol/L (ref 22–32)
Creatinine, Ser: 1.35 mg/dL — ABNORMAL HIGH (ref 0.44–1.00)
GFR calc Af Amer: 42 mL/min — ABNORMAL LOW (ref >60)
GFR calc non Af Amer: 36 mL/min — ABNORMAL LOW (ref >60)
GLUCOSE: 114 mg/dL — AB (ref 65–99)
Potassium: 4.6 mmol/L (ref 3.5–5.1)
SODIUM: 136 mmol/L (ref 135–145)

## 2015-06-02 LAB — CBC
HCT: 40.4 % (ref 36.0–46.0)
Hemoglobin: 12.9 g/dL (ref 12.0–15.0)
MCH: 31.7 pg (ref 26.0–34.0)
MCHC: 31.9 g/dL (ref 30.0–36.0)
MCV: 99.3 fL (ref 78.0–100.0)
Platelets: 136 10*3/uL — ABNORMAL LOW (ref 150–400)
RBC: 4.07 MIL/uL (ref 3.87–5.11)
RDW: 13.7 % (ref 11.5–15.5)
WBC: 14.4 10*3/uL — AB (ref 4.0–10.5)

## 2015-06-02 LAB — URINE CULTURE: Culture: 4000

## 2015-06-02 MED ORDER — LORAZEPAM 0.5 MG PO TABS: 0.5000 mg | ORAL_TABLET | Freq: Two times a day (BID) | ORAL | Status: DC | PRN

## 2015-06-02 MED ORDER — AZITHROMYCIN 250 MG PO TABS
500.0000 mg | ORAL_TABLET | Freq: Every day | ORAL | Status: AC
  Administered 2015-06-02: 500 mg via ORAL
  Filled 2015-06-02: qty 2

## 2015-06-02 MED ORDER — ACETAMINOPHEN 325 MG PO TABS: 650.0000 mg | ORAL_TABLET | Freq: Four times a day (QID) | ORAL | Status: DC | PRN

## 2015-06-02 MED ORDER — ENOXAPARIN SODIUM 30 MG/0.3ML ~~LOC~~ SOLN: 30.0000 mg | SUBCUTANEOUS | Status: DC

## 2015-06-02 MED ORDER — HYDROMORPHONE HCL 2 MG PO TABS
2.0000 mg | ORAL_TABLET | ORAL | Status: DC | PRN
  Administered 2015-06-02: 2 mg via ORAL
  Filled 2015-06-02: qty 1

## 2015-06-02 MED ORDER — ENOXAPARIN SODIUM 40 MG/0.4ML ~~LOC~~ SOLN
40.0000 mg | SUBCUTANEOUS | Status: DC
  Administered 2015-06-02: 40 mg via SUBCUTANEOUS
  Filled 2015-06-02: qty 0.4

## 2015-06-02 MED ORDER — VERAPAMIL HCL 120 MG PO TABS: 120.0000 mg | ORAL_TABLET | Freq: Two times a day (BID) | ORAL | Status: DC

## 2015-06-02 MED ORDER — HYDROMORPHONE HCL 2 MG PO TABS: 2.0000 mg | ORAL_TABLET | ORAL | Status: DC | PRN

## 2015-06-02 MED ORDER — AZITHROMYCIN 250 MG PO TABS: 250.0000 mg | ORAL_TABLET | Freq: Every day | ORAL | Status: DC

## 2015-06-02 MED ORDER — ENOXAPARIN SODIUM 40 MG/0.4ML ~~LOC~~ SOLN: 40.0000 mg | SUBCUTANEOUS | Status: DC

## 2015-06-02 MED ORDER — DIPHENHYDRAMINE HCL 25 MG PO CAPS: 25.0000 mg | ORAL_CAPSULE | Freq: Four times a day (QID) | ORAL | Status: DC | PRN

## 2015-06-02 MED ORDER — CEFUROXIME AXETIL 250 MG PO TABS
500.0000 mg | ORAL_TABLET | Freq: Two times a day (BID) | ORAL | Status: DC
  Administered 2015-06-02: 500 mg via ORAL
  Filled 2015-06-02: qty 2

## 2015-06-02 MED ORDER — HYDROMORPHONE HCL 4 MG PO TABS
4.0000 mg | ORAL_TABLET | ORAL | Status: DC | PRN
  Administered 2015-06-02: 4 mg via ORAL
  Filled 2015-06-02: qty 1

## 2015-06-02 MED ORDER — CEFUROXIME AXETIL 500 MG PO TABS
500.0000 mg | ORAL_TABLET | Freq: Two times a day (BID) | ORAL | Status: DC
Start: 2015-06-02 — End: 2015-06-24

## 2015-06-02 NOTE — Discharge Summary (Addendum)
Physician Discharge Summary  Emily Wagner ERX:[id number] DOB: 06/08/1933 DOA: 05/30/2015  PCP: Redge Gainer, MD  Admit date: 05/30/2015 Discharge date: 06/02/2015  Recommendations for Outpatient Follow-up:  1. Pelvic fractures. Conservative management per orthopedics. Likely will take 6-8 weeks to heal. Physical therapy and toe touch on the right for about six to eight weeks per orthopedics 2. Enoxaparin DVT prophylaxis for at least two weeks in the nursing home. Recommend following periodic CBC, check 7/8. Hemoglobin stable, no evidence of bleeding. Modest thrombocytopenia likely related to acute illness. 3. F/u with Dr. Luna Glasgow in about ten days to two weeks 4. Wean off oxygen as clinically indicated. Her hypoxia likely related to shallow inspiration from pain rather than infection.  5. Hourly incentive spirometry while in rehabilitation 6. Complete Zithromax 7/10 7. Complete Ceftin 7/9   Follow-up Information    Follow up with Sanjuana Kava, MD. Schedule an appointment as soon as possible for a visit in 10 days.   Specialty:  Orthopedic Surgery   Contact information:   [place] [address] [phone]      Discharge Diagnoses:  1. Pelvic fractures right superior and inferior rami with traumatic hematoma, secondary to mechanical fall 2. UTI 3. Acute hypoxic respiratory failure, likely secondary to atelectasis 4. Possible pneumonia, doubted  Discharge Condition: improved Disposition: SNF  Diet recommendation: Regular diet  Filed Weights   05/30/15 1808 05/30/15 2337  Weight: 72.576 kg (160 lb) 74.571 kg (164 lb 6.4 oz)    History of present illness:  81 year old woman who developed pelvic pain after a fall. Imaging revealed pelvic fracture with traumatic hematoma. She was admitted for pain control.  Hospital Course:  She was treated with medication for pain control. Antibiotics for UTI. Seen by orthopedics in consultation with recommendations for  conservative management as below. Mild hypoxia was noted, likely secondary to pain and shallow inspiration/atelectasis. No evidence of infection. Hospitalization was uncomplicated. Plan for short-term rehabilitation. Individual issues as below. Mild dehydration noted but her diet is not improving.  1. Pelvic fracture right pubic bone superior and inferior rami with traumatic hematoma. Conservative management per orthopedics. Six to eight weeks for the fractures to heal. She will not need any surgery. 2. UTI. Treated with Rocephin. Culture was unrevealing. 3. Acute hypoxic respiratory failure. Minimal leukocytosis, likely related to stress reaction from fracture. Afebrile. No shortness of breath. Lung exam unremarkable. Favor atelectasis secondary to pelvic pain rather than pneumonia. She has been on Rocephin since admission for UTI. Pneumonia is doubted at this point, however will cover with antibiotics. Continue incentive spirometry.   Physical therapy and toe touch on the right for about six to eight weeks per orthopedics  enoxaparin for at least two weeks in the nursing home   F/u with Dr. Luna Glasgow in about ten days to two weeks  Discharge Instructions    Current Discharge Medication List    START taking these medications   Details  azithromycin (ZITHROMAX) 250 MG tablet Take 1 tablet (250 mg total) by mouth daily. Start 7/7.    cefUROXime (CEFTIN) 500 MG tablet Take 1 tablet (500 mg total) by mouth 2 (two) times daily with a meal. Start 7/7. Qty: 6 tablet, Refills: 0    diphenhydrAMINE (BENADRYL) 25 mg capsule Take 1 capsule (25 mg total) by mouth every 6 (six) hours as needed for itching.    enoxaparin (LOVENOX) 40 MG/0.4ML injection Inject 0.4 mLs (40 mg total) into the skin daily. Qty: 0 Syringe  HYDROmorphone (DILAUDID) 2 MG tablet Take 1-2 tablets (2-4 mg total) by mouth every 4 (four) hours as needed for moderate pain. Qty: 30 tablet, Refills: 0      CONTINUE these  medications which have CHANGED   Details  LORazepam (ATIVAN) 0.5 MG tablet Take 1 tablet (0.5 mg total) by mouth 2 (two) times daily as needed for anxiety. Qty: 20 tablet, Refills: 0   Associated Diagnoses: Adjustment disorder with mixed anxiety and depressed mood      CONTINUE these medications which have NOT CHANGED   Details  aspirin EC 81 MG tablet Take 81 mg by mouth daily.    calcium carbonate (OS-CAL) 600 MG TABS tablet Take 600 mg by mouth daily with breakfast.    Cholecalciferol (VITAMIN D) 2000 UNITS CAPS Take 1 capsule by mouth daily.    fexofenadine-pseudoephedrine (ALLEGRA-D 24) 180-240 MG per 24 hr tablet Take 1 tablet by mouth daily.    levothyroxine (SYNTHROID, LEVOTHROID) 50 MCG tablet Take 1 tablet (50 mcg total) by mouth daily. Qty: 90 tablet, Refills: 3    Multiple Vitamins-Minerals (PRESERVISION AREDS 2) CAPS Take 1 capsule by mouth daily.    Polyethyl Glycol-Propyl Glycol (SYSTANE OP) Apply 1 drop to eye daily.    verapamil (CALAN) 120 MG tablet 1 I/2 tab bid Qty: 270 tablet, Refills: 3    escitalopram (LEXAPRO) 10 MG tablet 1 daily as directed Qty: 90 tablet, Refills: 3   Associated Diagnoses: Adjustment disorder with mixed anxiety and depressed mood    meclizine (ANTIVERT) 12.5 MG tablet Take 1 tablet (12.5 mg total) by mouth 2 (two) times daily as needed for dizziness. Qty: 30 tablet, Refills: 1      STOP taking these medications     ciprofloxacin (CIPRO) 500 MG tablet        Allergies  Allergen Reactions  . Alendronate Sodium   . Aspirin Free Childs [Acetaminophen]   . Ciprofloxacin     Patient denies allergy--"I take Cipro for bladder infections"  . Codeine   . Crestor [Rosuvastatin Calcium]   . Darvon   . Flonase [Fluticasone Propionate] Other (See Comments)    headache  . Lipitor [Atorvastatin Calcium]   . Nitrofurantoin Monohyd Macro Other (See Comments)    elevated liver test  . Pravachol   . Prevnar 13 [Pneumococcal 13-Val Conj  Vacc]     Nerve damage in left arm  . Sulfa Antibiotics   . Ultram [Tramadol Hcl] Itching  . Welchol [Colesevelam Hcl]   . Zetia [Ezetimibe]   . Zocor [Simvastatin]     The results of significant diagnostics from this hospitalization (including imaging, microbiology, ancillary and laboratory) are listed below for reference.    Significant Diagnostic Studies: Ct Pelvis Wo Contrast  05/30/2015   CLINICAL DATA:  [place] on gravel, with severe right hip and buttock pain. Initial encounter.  EXAM: CT PELVIS WITHOUT CONTRAST  TECHNIQUE: Multidetector CT imaging of the pelvis was performed following the standard protocol without intravenous contrast.  COMPARISON:  Right hip radiographs performed earlier today at 7:07 p.m.  FINDINGS: There is a mildly comminuted fracture extending across the right sacral ala, likely extending into the right sacroiliac joint. This does not appear to involve the underlying bony foramina. No sacroiliac joint space widening is seen.  There is also a mildly displaced slightly comminuted fracture involving the medial right superior pubic ramus and medial right inferior pubic ramus, with likely extension to the pubic symphysis. No additional fractures are seen.  The patient's right-sided  dynamic hip screw is unremarkable in appearance, without evidence of loosening. The femoral heads are seated normally within the respective acetabula. There is no evidence of diastasis at the symphysis.  Blood is noted tracking along the right pelvic sidewall, with a 9.2 x 3.6 x 5.4 cm hematoma tracking superior to the right pubic rami fractures, extending about the right side of the space of Retzius into the right lower quadrant.  Scattered diverticulosis is noted along the sigmoid colon, without evidence of diverticulitis. Visualized small bowel loops are grossly unremarkable. Scattered vascular calcifications are seen.  IMPRESSION: 1. Mildly displaced slightly comminuted fracture involving the medial  right superior and inferior pubic rami, with likely extension to the pubic symphysis. 2. Mildly comminuted fracture extending across the right sacral ala, likely extending into the right sacroiliac joint. This does not appear to involve the underlying bony foramina. No sacroiliac joint space widening seen. 3. Blood noted tracking along the right pelvic sidewall, with a 9.2 x 3.6 x 5.4 cm hematoma tracking superior to the right pubic rami fractures, extending about the right side of the space of Retzius into the right lower quadrant. 4. Right-sided dynamic hip screw is unremarkable in appearance, without evidence of loosening. 5. Scattered diverticulosis along the sigmoid colon, without evidence of diverticulitis. 6. Scattered vascular calcifications seen.  These results were called by telephone at the time of interpretation on 05/30/2015 at 9:50 pm to Dr. Francine Graven, who verbally acknowledged these results.   Electronically Signed   By: Garald Balding M.D.   On: 05/30/2015 21:50   Dg Chest Port 1 View  06/02/2015   CLINICAL DATA:  New onset hypoxia and productive cough. Pelvic fractures 05/30/2015.  EXAM: PORTABLE CHEST - 1 VIEW  COMPARISON:  PA and lateral chest 10/16/2013.  FINDINGS: There is bibasilar airspace disease and likely small effusions. Lung volumes are low. Heart size is upper normal. No pneumothorax is identified.  IMPRESSION: Likely small bilateral pleural effusions with basilar airspace disease which could be atelectasis or pneumonia.   Electronically Signed   By: Inge Rise M.D.   On: 06/02/2015 14:08   Dg Hip Unilat With Pelvis 2-3 Views Right  05/30/2015   CLINICAL DATA:  Right hip pain after she slipped and fell down a hill today.  EXAM: RIGHT HIP (WITH PELVIS) 2-3 VIEWS  COMPARISON:  None.  FINDINGS: There are fractures of the right inferior and superior pubic rami. The fractures appear to involve the right pubic body as well.  The intertrochanteric fracture of the proximal right  femur has completely healed. Compression screw and side plate and screws in place.  IMPRESSION: Acute fractures of the right inferior and superior pubic rami and right pubic body.   Electronically Signed   By: Lorriane Shire M.D.   On: 05/30/2015 19:29    Microbiology: Recent Results (from the past 240 hour(s))  Culture, Urine     Status: None   Collection Time: 05/31/15 11:30 PM  Result Value Ref Range Status   Specimen Description URINE, RANDOM  Final   Special Requests NONE  Final   Culture   Final    4,000 COLONIES/mL INSIGNIFICANT GROWTH Performed at [facility]    Report Status 06/02/2015 FINAL  Final     Labs: Basic Metabolic Panel:  Recent Labs Lab 05/30/15 1900 05/31/15 0626 06/01/15 0604 06/02/15 1146  NA 141 142 140 136  K 3.9 4.2 4.4 4.6  CL 108 108 109 103  CO2 24 25 23  26  GLUCOSE 98 197* 115* 114*  BUN 24* 24* 32* 41*  CREATININE 0.95 1.32* 1.38* 1.35*  CALCIUM 8.8* 8.4* 7.9* 8.7*   Liver Function Tests:  Recent Labs Lab 05/31/15 0626  AST 72*  ALT 42  ALKPHOS 96  BILITOT 0.8  PROT 7.1  ALBUMIN 3.7   CBC:  Recent Labs Lab 05/30/15 1900 05/31/15 0626 06/01/15 0604 06/02/15 1146  WBC 14.1* 13.7* 11.0* 14.4*  NEUTROABS 12.1*  --   --   --   HGB 14.4 13.8 12.8 12.9  HCT 44.0 43.9 40.4 40.4  MCV 95.4 99.3 99.0 99.3  PLT 178 185 147* 136*    Principal Problem:   Pelvic fracture Active Problems:   Essential hypertension   UTI (urinary tract infection)   Acute respiratory failure with hypoxia   Time coordinating discharge: 40 minutes  Signed:  Murray Hodgkins, MD [facility] 06/02/2015, 2:46 PM

## 2015-06-02 NOTE — Progress Notes (Addendum)
  PROGRESS NOTE  Wilhelmina Hark YQM:[id number] DOB: 1933-05-13 DOA: 05/30/2015 PCP: Redge Gainer, MD  Summary: 81 year old woman who developed pelvic pain after a fall. Imaging revealed pelvic fracture with traumatic hematoma. Orthopedics recommended conservative care. Physical therapy recommend skilled nursing facility rehabilitation. Has  Assessment/Plan: 1. Pelvic fracture right pubic bone superior and inferior rami with traumatic hematoma. Conservative management per orthopedics. Six to eight weeks for the fractures to heal. She will not need any surgery. 2. UTI. Treated with Rocephin. Culture was unrevealing. 3. Acute hypoxic respiratory failure. Minimal leukocytosis, likely related to stress reaction from fracture. Afebrile. No shortness of breath. Lung exam unremarkable. Favor atelectasis secondary to pelvic pain rather than pneumonia. She has been on Rocephin since admission for UTI. Pneumonia is doubted at this point, however will cover with antibiotics. Continue incentive spirometry. 4. Anxiety, depression 5. Interstitial cystitis   Overall improving. Continue pain control. Start on oral medications. She reports she can tolerate Tylenol without difficulty.  Physical therapy and toe touch on the right for about six to eight weeks per orthopedics  enoxaparin for at least two weeks in the nursing home   F/u with Dr. Luna Glasgow in about ten days to two weeks  Check BMP and CBC. Anticipate transfer to SNF when bed available  Code Status: full code DVT prophylaxis: SCDs >> Lovenox Family Communication: none present. Patient alert, understands plan. Disposition Plan: SNF  Murray Hodgkins, MD  [facility]  Pager [phone] If 7PM-7AM, please contact night-coverage at [email or web address], password [facility] 06/02/2015, 10:16 AM  LOS: 2 days   Consultants:  Orthopedics   Procedures:    Antibiotics:  Ceftriaxone 7/3 >> 7/5  HPI/Subjective: Some pain in right hip. Appetite  improving.  Objective: Filed Vitals:   06/01/15 1504 06/01/15 1919 06/01/15 2050 06/02/15 0612  BP:   165/97 111/71  Pulse:   88 83  Temp:   98.8 F (37.1 C) 98.6 F (37 C)  TempSrc:   Oral Oral  Resp:   20 18  Height:      Weight:      SpO2: 86% 93% 93% 93%    Intake/Output Summary (Last 24 hours) at 06/02/15 1016 Last data filed at 06/02/15 4401  Gross per 24 hour  Intake 838.75 ml  Output    550 ml  Net 288.75 ml     Filed Weights   05/30/15 1808 05/30/15 2337  Weight: 72.576 kg (160 lb) 74.571 kg (164 lb 6.4 oz)    Exam:     Afebrile, vital signs stable General: Appears calm and comfortable Cardiovascular: RRR, no m/r/g. No LE edema. Respiratory: CTA bilaterally, no w/r/r. Normal respiratory effort. Abdomen: soft, ntnd Musculoskeletal: grossly normal tone BUE/BLE. Dorsalis pedis pulse 2+ right foot. Psychiatric: grossly normal mood and affect, speech fluent and appropriate  New data reviewed:  None   Pertinent data since admission:  UC insignificant growth  Pending data:    Scheduled Meds: . cefTRIAXone (ROCEPHIN)  IV  1 g Intravenous Q24H  . cholecalciferol  2,000 Units Oral Daily  . escitalopram  10 mg Oral Daily  . levothyroxine  50 mcg Oral QAC breakfast  . pantoprazole  40 mg Oral Daily  . sodium chloride  3 mL Intravenous Q12H  . verapamil  120 mg Oral TID   Continuous Infusions:   Active Problems:   Essential hypertension   Pelvic fracture

## 2015-06-02 NOTE — Progress Notes (Signed)
Physical Therapy Treatment Patient Details Name: Emily Wagner MRN: [id number] DOB: 07-25-1933 Today's Date: 06/02/2015    History of Present Illness 81 year old female who  has a past medical history of Renal insufficiency; Other and unspecified hyperlipidemia; Benign hypertensive heart disease without heart failure; Adjustment disorder with mixed anxiety and depressed mood; Other dyspnea and respiratory abnormality; Hematuria; Interstitial cystitis; Osteopenia; and ASCVD (arteriosclerotic cardiovascular disease).  Today came to the hospital after patient had fall and complained of severe right buttock pain. Patient says that today she was in her backyard and she slipped and fell backward down a hill landing on the gravel rocks. The she had to crawl back up the hill on her hands and knees. Patient denies hitting her head. No history of passing out. No nausea vomiting or diarrhea. No chest pain or shortness of breath. In the ED patient was found to have pelvic fracture CT of the Pelvis showed mildly displaced slightly comminuted fracture involving the medial right superior and inferior pubic rami with likely extension to the pubic symphysis. Also blood noted tracking in the right pelvic sidewall with a [id number] cm hematoma tracking superior to these right pubic rami fracture.    PT Comments    Pt making progress toward goals as evidenced by improved tolerance to therex. Pt reporting more pain today, which she attributes to prolonged rolling during bathing today. Ambulation and transfers held this session to preserve energy for pending DC. Pt to continue to [facility] as previously indicated to restore to PLOF in mobility, ambulation, and strength.  Follow Up Recommendations  SNF     Equipment Recommendations  None recommended by PT    Recommendations for Other Services       Precautions / Restrictions Precautions Precautions: Fall Restrictions Weight Bearing Restrictions: No     Mobility  Bed Mobility               General bed mobility comments: None performed today; Therex only.   Transfers                 General transfer comment: None performed today; Therex only.   Ambulation/Gait             General Gait Details: None performed today; Therex only.    Stairs            Wheelchair Mobility    Modified Rankin (Stroke Patients Only)       Balance                                    Cognition Arousal/Alertness: Awake/alert Behavior During Therapy: WFL for tasks assessed/performed Overall Cognitive Status: Within Functional Limits for tasks assessed                      Exercises Total Joint Exercises Towel Squeeze: AROM;Strengthening;Both;10 reps General Exercises - Lower Extremity Ankle Circles/Pumps: AROM;Supine;15 reps;Both Gluteal Sets: AROM;Both;Supine;Strengthening;10 reps Short Arc Quad: AROM;Both;Strengthening;15 reps;Supine Heel Slides: AROM;Strengthening;Both;15 reps;Supine Hip ABduction/ADduction: AAROM;15 reps;Supine;Both;Strengthening Straight Leg Raises: AROM;Strengthening;Right;10 reps;Supine (MaxA to complete on R, MinA on L. )    General Comments        Pertinent Vitals/Pain Pain Assessment: No/denies pain Pain Score: 8  Pain Location: R Pelvis, anterior adn posterior. Reports worse than yesterday, which she attributes to rolling today during bathing.  Pain Intervention(s): Monitored during session;Limited activity within patient's tolerance;Premedicated before session;Repositioned    Home  Living                      Prior Function            PT Goals (current goals can now be found in the care plan section) Acute Rehab PT Goals Patient Stated Goal: to get stronger and walk  PT Goal Formulation: With patient Time For Goal Achievement: 06/14/15 Potential to Achieve Goals: Good Progress towards PT goals: Progressing toward goals    Frequency  Min  5X/week    PT Plan Current plan remains appropriate    Co-evaluation             End of Session   Activity Tolerance: Patient tolerated treatment well;Patient limited by pain Patient left: with call bell/phone within reach;in bed;with bed alarm set     Time: 1240-1253 PT Time Calculation (min) (ACUTE ONLY): 13 min  Charges:  $Therapeutic Exercise: 8-22 mins                    G Codes:      Buccola,Allan C 25-Jun-2015, 12:56 PM  12:58 PM  Etta Grandchild, PT, DPT [place] License # [id number]

## 2015-06-02 NOTE — Progress Notes (Signed)
Reported called and given to [facility] at [facility]. Reported last pain medication time and dose given. Pt vitals were stable and she is alert and oriented. IV and foley were removed prior to transport. EMS arrived to transport pt.

## 2015-06-02 NOTE — Progress Notes (Signed)
Attempted to wean patient off of oxygen per orders, oxygen saturation on room air 84%, oxygen reapplied at 3L/[place] and saturation increased to 93%. Patient coughed up greenish-yellow sputum and reports feeling congested.  Dr. Sarajane Jews notified of above.

## 2015-06-02 NOTE — Clinical Social Work Note (Signed)
CSW facilitated discharge.  CSW contacted Claiborne Billings at [facility] and advised that patient was being discharged today. CSW provided [place] with the following Authorization information: Auth number [id number], approved for three days, next review date is 7/8, preliminary RUGS level is RVA.    CSW arranged transportation for patient through [facility].  CSW notified patient's daughter, Mrs. Simeon Craft, of patients discharged and transport to facility via [facility].  CSW signing off.  [name], [place]

## 2015-06-02 NOTE — Progress Notes (Signed)
Pt upset. She states she has been waiting for EMS since 4:00p.m. She requested to speak with the head of the EMS. Pt given emotional support. Will continue to monitor and provide emotional support for pt.

## 2015-06-24 ENCOUNTER — Encounter: Payer: Self-pay | Admitting: Family Medicine

## 2015-06-24 ENCOUNTER — Ambulatory Visit (INDEPENDENT_AMBULATORY_CARE_PROVIDER_SITE_OTHER): Payer: Medicare Other | Admitting: Family Medicine

## 2015-06-24 VITALS — BP 131/88 | HR 97 | Temp 97.2°F | Ht 64.0 in | Wt 154.0 lb

## 2015-06-24 DIAGNOSIS — I2510 Atherosclerotic heart disease of native coronary artery without angina pectoris: Secondary | ICD-10-CM

## 2015-06-24 DIAGNOSIS — E039 Hypothyroidism, unspecified: Secondary | ICD-10-CM | POA: Diagnosis not present

## 2015-06-24 DIAGNOSIS — S329XXA Fracture of unspecified parts of lumbosacral spine and pelvis, initial encounter for closed fracture: Secondary | ICD-10-CM

## 2015-06-24 DIAGNOSIS — E559 Vitamin D deficiency, unspecified: Secondary | ICD-10-CM

## 2015-06-24 DIAGNOSIS — I10 Essential (primary) hypertension: Secondary | ICD-10-CM | POA: Diagnosis not present

## 2015-06-24 DIAGNOSIS — F4323 Adjustment disorder with mixed anxiety and depressed mood: Secondary | ICD-10-CM

## 2015-06-24 DIAGNOSIS — N289 Disorder of kidney and ureter, unspecified: Secondary | ICD-10-CM | POA: Diagnosis not present

## 2015-06-24 LAB — POCT CBC
Granulocyte percent: 67.4 %G (ref 37–80)
HCT, POC: 44.7 % (ref 37.7–47.9)
Hemoglobin: 15 g/dL (ref 12.2–16.2)
LYMPH, POC: 1.5 (ref 0.6–3.4)
MCH, POC: 30.9 pg (ref 27–31.2)
MCHC: 33.6 g/dL (ref 31.8–35.4)
MCV: 92.1 fL (ref 80–97)
MPV: 7.4 fL (ref 0–99.8)
PLATELET COUNT, POC: 285 10*3/uL (ref 142–424)
POC Granulocyte: 3.9 (ref 2–6.9)
POC LYMPH PERCENT: 26.2 %L (ref 10–50)
RBC: 4.85 M/uL (ref 4.04–5.48)
RDW, POC: 14.6 %
WBC: 5.8 10*3/uL (ref 4.6–10.2)

## 2015-06-24 MED ORDER — MECLIZINE HCL 12.5 MG PO TABS: 12.5000 mg | ORAL_TABLET | Freq: Two times a day (BID) | ORAL | Status: DC | PRN

## 2015-06-24 MED ORDER — LORAZEPAM 0.5 MG PO TABS: 0.5000 mg | ORAL_TABLET | Freq: Two times a day (BID) | ORAL | Status: DC | PRN

## 2015-06-24 NOTE — Patient Instructions (Addendum)
Continue current medications. Continue good therapeutic lifestyle changes which include good diet and exercise. Fall precautions discussed with patient. If an FOBT was given today- please return it to our front desk. If you are over 79 years old - you may need Prevnar 20 or the adult Pneumonia vaccine.   After your visit with [name] today you will receive a survey in the mail or online from [employer] regarding your care with [name]. Please take a moment to fill this out. Your feedback is very important to [name] as you can help [name] better understand your patient needs as well as improve your experience and satisfaction. WE CARE ABOUT YOU!!!                            Medicare Annual Wellness Visit  [facility] and the medical providers at [facility] strive to bring you the best medical care.  In doing so we not only want to address your current medical conditions and concerns but also to detect new conditions early and prevent illness, disease and health-related problems.    Medicare offers a yearly Wellness Visit which allows our clinical staff to assess your need for preventative services including immunizations, lifestyle education, counseling to decrease risk of preventable diseases and screening for fall risk and other medical concerns.    This visit is provided free of charge (no copay) for all Medicare recipients. The clinical pharmacists at [facility] have begun to conduct these Wellness Visits which will also include a thorough review of all your medications.    As you primary medical provider recommend that you make an appointment for your Annual Wellness Visit if you have not done so already this year.  You may set up this appointment before you leave today or you may call back ([phone]) and schedule an appointment.  Please make sure when you call that you mention that you are scheduling your Annual Wellness Visit with the clinical pharmacist so  that the appointment may be made for the proper length of time.     continue to follow-up with orthopedic surgeon  Continue physical therapy at home  Continue to be careful and do not put yourself at risk for falling  Continue monitor blood pressures at home  Return the FOBT  We will call you with your lab work results once those results become available

## 2015-06-24 NOTE — Progress Notes (Signed)
Subjective:    Patient ID: Emily Wagner, female    DOB: 1933/06/02, 81 y.o.   MRN: [id number]  HPI  Pt here for follow up and management of chronic medical problems which include hypothyroidism and hypertension.  She is taking medications regularly. The patient recently had a fall and sustained a pubic ramus fracture and she was hospitalized and then went to rehabilitation and is now back at home using her walker when she comes to the visit today.  She has a follow-up visit with orthopedic surgeon in mid August. She is not driving at this point in time. She denies chest pain or shortness of breath. She had some nausea from the pain that she experienced with the fractures in her pelvis but that is better. She denies any blood in the stool or black tarry bowel movements. She is passing her water without problems.         Patient Active Problem List   Diagnosis Date Noted  . UTI (urinary tract infection) 06/02/2015  . Acute respiratory failure with hypoxia 06/02/2015  . Pelvic fracture 05/30/2015  . Allergy to multiple medications 07/15/2013  . Renal insufficiency   . Adjustment disorder with mixed anxiety and depressed mood   . Osteoporosis   . ASCVD (arteriosclerotic cardiovascular disease)   . 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  . HYPERCHOLESTEROLEMIA 05/02/2008  . Essential hypertension 05/02/2008  . INTERSTITIAL CYSTITIS 05/02/2008  . DIVERTICULOSIS OF COLON 08/27/2003   Outpatient Encounter Prescriptions as of 06/24/2015  Medication Sig  . aspirin EC 81 MG tablet Take 81 mg by mouth daily.  . calcium carbonate (OS-CAL) 600 MG TABS tablet Take 600 mg by mouth daily with breakfast.  . Cholecalciferol (VITAMIN D) 2000 UNITS CAPS Take 1 capsule by mouth daily.  . fexofenadine-pseudoephedrine (ALLEGRA-D 24) 180-240 MG per 24 hr tablet Take 1 tablet by mouth daily.  Marland Kitchen levothyroxine (SYNTHROID, LEVOTHROID) 50 MCG tablet Take 1 tablet (50 mcg total) by mouth daily.  Marland Kitchen LORazepam  (ATIVAN) 0.5 MG tablet Take 1 tablet (0.5 mg total) by mouth 2 (two) times daily as needed for anxiety.  . meclizine (ANTIVERT) 12.5 MG tablet Take 1 tablet (12.5 mg total) by mouth 2 (two) times daily as needed for dizziness.  . Multiple Vitamins-Minerals (PRESERVISION AREDS 2) CAPS Take 1 capsule by mouth daily.  Vladimir Faster Glycol-Propyl Glycol (SYSTANE OP) Apply 1 drop to eye daily.  . verapamil (CALAN) 120 MG tablet 1 I/2 tab bid (Patient taking differently: Take 120 mg by mouth 2 (two) times daily. )  . [DISCONTINUED] azithromycin (ZITHROMAX) 250 MG tablet Take 1 tablet (250 mg total) by mouth daily. Start 7/7. (Patient not taking: Reported on 06/24/2015)  . [DISCONTINUED] cefUROXime (CEFTIN) 500 MG tablet Take 1 tablet (500 mg total) by mouth 2 (two) times daily with a meal. Start 7/7. (Patient not taking: Reported on 06/24/2015)  . [DISCONTINUED] diphenhydrAMINE (BENADRYL) 25 mg capsule Take 1 capsule (25 mg total) by mouth every 6 (six) hours as needed for itching. (Patient not taking: Reported on 06/24/2015)  . [DISCONTINUED] enoxaparin (LOVENOX) 40 MG/0.4ML injection Inject 0.4 mLs (40 mg total) into the skin daily. (Patient not taking: Reported on 06/24/2015)  . [DISCONTINUED] escitalopram (LEXAPRO) 10 MG tablet 1 daily as directed (Patient not taking: Reported on 06/24/2015)  . [DISCONTINUED] HYDROmorphone (DILAUDID) 2 MG tablet Take 1-2 tablets (2-4 mg total) by mouth every 4 (four) hours as needed for moderate pain. (Patient not taking: Reported on 06/24/2015)   No facility-administered encounter  medications on file as of 06/24/2015.     Review of Systems  Constitutional: Negative.   HENT: Negative.   Eyes: Negative.   Respiratory: Negative.   Cardiovascular: Negative.   Gastrointestinal: Negative.   Endocrine: Negative.   Genitourinary: Negative.   Musculoskeletal: Negative.        Recent fall, 4 pelvic fractures  Skin: Negative.   Allergic/Immunologic: Negative.   Neurological:  Negative.   Hematological: Negative.   Psychiatric/Behavioral: Negative.        Objective:   Physical Exam  Constitutional: She is oriented to person, place, and time. She appears well-developed and well-nourished. No distress.  Visit today with her rolling walker because of her recent fall and pelvic fractures.  HENT:  Head: Normocephalic and atraumatic.  Left Ear: External ear normal.  Nose: Nose normal.  Mouth/Throat: Oropharynx is clear and moist. No oropharyngeal exudate.  She wears hearing aids bilaterally and she has minimal wax in the right ear canal. She is also had recent cataract surgery in addition to getting the hearing aids.  Eyes: Conjunctivae and EOM are normal. Pupils are equal, round, and reactive to light. Right eye exhibits no discharge. Left eye exhibits no discharge. No scleral icterus.  Neck: Normal range of motion. Neck supple. No thyromegaly present.  Without bruits or thyromegaly   Cardiovascular: Normal rate, regular rhythm, normal heart sounds and intact distal pulses.   No murmur heard. At 84/m with a regular rate and rhythm  Pulmonary/Chest: Effort normal and breath sounds normal. No respiratory distress. She has no wheezes. She has no rales. She exhibits no tenderness.  Clear anteriorly and posteriorly  Abdominal: Soft. Bowel sounds are normal. She exhibits no mass. There is no tenderness. There is no rebound and no guarding.  No abdominal bruits or tenderness  Musculoskeletal: She exhibits no edema or tenderness.  Patient is walking with minimal pain using her walker secondary to the pelvic fractures  Lymphadenopathy:    She has no cervical adenopathy.  Neurological: She is alert and oriented to person, place, and time. She has normal reflexes. No cranial nerve deficit.  Skin: Skin is warm and dry. No rash noted.  Psychiatric: She has a normal mood and affect. Her behavior is normal. Judgment and thought content normal.  Nursing note and vitals  reviewed.   BP 131/88 mmHg  Pulse 97  Temp(Src) 97.2 F (36.2 C) (Oral)  Ht _0  (1.626 m)  Wt 154 lb (69.854 kg)  BMI 26.42 kg/m2  LMP 03/21/1971         Assessment & Plan:  1. Essential hypertension -Blood pressure is under good control today in home readings were reviewed and will be scanned into the record and she should continue with current treatment - POCT CBC - BMP8+EGFR - Hepatic function panel - NMR, lipoprofile  2. ASCVD (arteriosclerotic cardiovascular disease) -She is having no chest pain and we will continue to monitor this. - POCT CBC  3. Hypothyroidism, unspecified hypothyroidism type -Continue current treatment pending results of lab work - POCT CBC - Thyroid Panel With TSH  4. Vitamin D deficiency -Continue current treatment pending results of lab work - POCT CBC - Vit D  25 hydroxy (rtn osteoporosis monitoring)  5. Renal insufficiency -The patient is having no signs of symptoms of worsening renal insufficiency and we will check a creatinine in color with the results once these results become available. She knows to avoid all NSAIDs. - POCT CBC - BMP8+EGFR  6. Adjustment disorder with  mixed anxiety and depressed mood - LORazepam (ATIVAN) 0.5 MG tablet; Take 1 tablet (0.5 mg total) by mouth 2 (two) times daily as needed for anxiety.  Dispense: 180 tablet; Refill: 0  7. Pelvic fracture, closed, initial encounter -Follow-up with orthopedist as planned and continue with physical therapy  Meds ordered this encounter  Medications  . meclizine (ANTIVERT) 12.5 MG tablet    Sig: Take 1 tablet (12.5 mg total) by mouth 2 (two) times daily as needed for dizziness.    Dispense:  30 tablet    Refill:  1  . LORazepam (ATIVAN) 0.5 MG tablet    Sig: Take 1 tablet (0.5 mg total) by mouth 2 (two) times daily as needed for anxiety.    Dispense:  180 tablet    Refill:  0   Patient Instructions  Continue current medications. Continue good therapeutic  lifestyle changes which include good diet and exercise. Fall precautions discussed with patient. If an FOBT was given today- please return it to our front desk. If you are over 64 years old - you may need Prevnar 47 or the adult Pneumonia vaccine.   After your visit with [name] today you will receive a survey in the mail or online from [employer] regarding your care with [name]. Please take a moment to fill this out. Your feedback is very important to [name] as you can help [name] better understand your patient needs as well as improve your experience and satisfaction. WE CARE ABOUT YOU!!!                            Medicare Annual Wellness Visit  [facility] and the medical providers at [facility] strive to bring you the best medical care.  In doing so we not only want to address your current medical conditions and concerns but also to detect new conditions early and prevent illness, disease and health-related problems.    Medicare offers a yearly Wellness Visit which allows our clinical staff to assess your need for preventative services including immunizations, lifestyle education, counseling to decrease risk of preventable diseases and screening for fall risk and other medical concerns.    This visit is provided free of charge (no copay) for all Medicare recipients. The clinical pharmacists at [facility] have begun to conduct these Wellness Visits which will also include a thorough review of all your medications.    As you primary medical provider recommend that you make an appointment for your Annual Wellness Visit if you have not done so already this year.  You may set up this appointment before you leave today or you may call back ([phone]) and schedule an appointment.  Please make sure when you call that you mention that you are scheduling your Annual Wellness Visit with the clinical pharmacist so that the appointment may be made for the proper length of  time.     continue to follow-up with orthopedic surgeon  Continue physical therapy at home  Continue to be careful and do not put yourself at risk for falling  Continue monitor blood pressures at home  Return the FOBT  We will call you with your lab work results once those results become available   Arrie Senate MD

## 2015-06-25 LAB — THYROID PANEL WITH TSH
Free Thyroxine Index: 2.2 (ref 1.2–4.9)
T3 Uptake Ratio: 31 % (ref 24–39)
T4 TOTAL: 7 ug/dL (ref 4.5–12.0)
TSH: 1.64 u[IU]/mL (ref 0.450–4.500)

## 2015-06-25 LAB — BMP8+EGFR
BUN/Creatinine Ratio: 22 (ref 11–26)
BUN: 19 mg/dL (ref 8–27)
CO2: 21 mmol/L (ref 18–29)
CREATININE: 0.88 mg/dL (ref 0.57–1.00)
Calcium: 9.3 mg/dL (ref 8.7–10.3)
Chloride: 100 mmol/L (ref 97–108)
GFR calc Af Amer: 72 mL/min/{1.73_m2} (ref >59)
GFR calc non Af Amer: 62 mL/min/{1.73_m2} (ref >59)
Glucose: 95 mg/dL (ref 65–99)
Potassium: 4 mmol/L (ref 3.5–5.2)
SODIUM: 139 mmol/L (ref 134–144)

## 2015-06-25 LAB — NMR, LIPOPROFILE
Cholesterol: 193 mg/dL (ref 100–199)
HDL CHOLESTEROL BY NMR: 44 mg/dL (ref >39)
HDL Particle Number: 23 umol/L — ABNORMAL LOW (ref >=30.5)
LDL PARTICLE NUMBER: 1328 nmol/L — AB (ref <1000)
LDL Size: 21.2 nm (ref >20.5)
LDL-C: 117 mg/dL — ABNORMAL HIGH (ref 0–99)
LP-IR SCORE: 40 (ref <=45)
Small LDL Particle Number: 446 nmol/L (ref <=527)
Triglycerides by NMR: 162 mg/dL — ABNORMAL HIGH (ref 0–149)

## 2015-06-25 LAB — HEPATIC FUNCTION PANEL
ALBUMIN: 4.1 g/dL (ref 3.5–4.7)
ALT: 7 IU/L (ref 0–32)
AST: 17 IU/L (ref 0–40)
Alkaline Phosphatase: 197 IU/L — ABNORMAL HIGH (ref 39–117)
BILIRUBIN, DIRECT: 0.16 mg/dL (ref 0.00–0.40)
Bilirubin Total: 0.6 mg/dL (ref 0.0–1.2)
Total Protein: 7.1 g/dL (ref 6.0–8.5)

## 2015-06-25 LAB — VITAMIN D 25 HYDROXY (VIT D DEFICIENCY, FRACTURES): VIT D 25 HYDROXY: 51.9 ng/mL (ref 30.0–100.0)

## 2015-06-28 ENCOUNTER — Telehealth: Payer: Self-pay | Admitting: Family Medicine

## 2015-06-28 NOTE — Telephone Encounter (Signed)
Last office visit July 28 with lab work.  Requesting medication to aid with sleeping.

## 2015-06-28 NOTE — Telephone Encounter (Signed)
Have patient try melatonin 3 mg 1 at bedtime. This is over-the-counter. If this doesn't work she should call us back in a week and we will try something different

## 2015-07-02 ENCOUNTER — Telehealth: Payer: Self-pay | Admitting: Family Medicine

## 2015-07-02 NOTE — Telephone Encounter (Signed)
Try Benadryl 25 mg one half tablet 4 times daily if needed. If it makes her too drowsy discontinue it and call us back

## 2015-07-06 ENCOUNTER — Ambulatory Visit (INDEPENDENT_AMBULATORY_CARE_PROVIDER_SITE_OTHER): Payer: Medicare Other | Admitting: Physician Assistant

## 2015-07-06 ENCOUNTER — Encounter: Payer: Self-pay | Admitting: Physician Assistant

## 2015-07-06 VITALS — BP 157/105 | HR 94 | Temp 97.5°F | Ht 64.0 in | Wt 154.0 lb

## 2015-07-06 DIAGNOSIS — L570 Actinic keratosis: Secondary | ICD-10-CM | POA: Diagnosis not present

## 2015-07-06 NOTE — Progress Notes (Signed)
Patient ID: Emily Wagner, female   DOB: January 14, 1933, 81 y.o.   MRN: [id number]   81 y/o female presents for lesion on left lower leg, right temple and above right eye x 2-3 months. She states that they scale up , bleed and scale up again. No history of skin cancer. Fair skin. Saw Dr. Denna Haggard in the past x several years ago.   Hyperkeratotic scaling patch with erythema on medial left shin, right temple, left cheek, Likely actinic keratosis d/t past sun exposure.   Smooth papules over right eye and right nose. Possible BCC or intradermal nevi.   Patient would like to attempt treatment via cryosurgery today. If lesions recur, she agrees to rtc for biopsy to r/o BCC/SCC.   Lesions on face and LLE treated with cryosurgery x 5. Biopsy in 3-4 weeks if lesions recur.   Tiffany A. Benjamin Stain PA-C

## 2015-07-06 NOTE — Patient Instructions (Signed)
Follow up in 3-4 weeks if lesions recur after treatment today. Apply vaseline or hydrocortisone for itch

## 2015-07-16 ENCOUNTER — Telehealth: Payer: Self-pay | Admitting: *Deleted

## 2015-07-16 NOTE — Telephone Encounter (Signed)
111/ 73  107/ 71 With standing feels weak and dizzy. Sitting is ok.  She questions if she should decrease her VERAPAMIL 120mg  - she is taking 1 BID

## 2015-07-16 NOTE — Telephone Encounter (Signed)
Her BP was 107/71, she thinks she should cut back on her meds, please call her

## 2015-07-16 NOTE — Telephone Encounter (Signed)
The patient may decrease the verapamil to a half a pill in the morning and stay on a whole pill at nighttime. Continue to record readings. Let us review these readings in a couple weeks. Please drink plenty of water and fluids and stay well hydrated.

## 2015-07-16 NOTE — Telephone Encounter (Signed)
Pt called and aware of the change

## 2015-08-03 ENCOUNTER — Other Ambulatory Visit: Payer: Medicare Other

## 2015-08-03 DIAGNOSIS — R7989 Other specified abnormal findings of blood chemistry: Secondary | ICD-10-CM

## 2015-08-03 DIAGNOSIS — R945 Abnormal results of liver function studies: Principal | ICD-10-CM

## 2015-08-03 NOTE — Progress Notes (Signed)
Lab only 

## 2015-08-04 LAB — HEPATIC FUNCTION PANEL
ALBUMIN: 4.1 g/dL (ref 3.5–4.7)
ALT: 8 IU/L (ref 0–32)
AST: 17 IU/L (ref 0–40)
Alkaline Phosphatase: 129 IU/L — ABNORMAL HIGH (ref 39–117)
Bilirubin Total: 0.5 mg/dL (ref 0.0–1.2)
Bilirubin, Direct: 0.14 mg/dL (ref 0.00–0.40)
Total Protein: 7.1 g/dL (ref 6.0–8.5)

## 2015-09-07 ENCOUNTER — Ambulatory Visit (INDEPENDENT_AMBULATORY_CARE_PROVIDER_SITE_OTHER): Payer: Medicare Other

## 2015-09-07 DIAGNOSIS — Z23 Encounter for immunization: Secondary | ICD-10-CM

## 2015-09-10 ENCOUNTER — Telehealth: Payer: Self-pay | Admitting: Family Medicine

## 2015-09-10 MED ORDER — LEVOTHYROXINE SODIUM 50 MCG PO TABS: 100.0000 ug | ORAL_TABLET | Freq: Every day | ORAL | Status: DC

## 2015-09-10 NOTE — Telephone Encounter (Signed)
Pt aware and rx fixed

## 2015-09-13 ENCOUNTER — Telehealth: Payer: Self-pay | Admitting: Family Medicine

## 2015-09-13 NOTE — Telephone Encounter (Signed)
Note needs to go to [facility]

## 2015-09-13 NOTE — Telephone Encounter (Signed)
Patient is going to bring a corrected list of her medications to [facility] in the am.

## 2015-09-28 HISTORY — PX: FEMUR FRACTURE SURGERY: SHX633

## 2015-10-01 ENCOUNTER — Encounter: Payer: Self-pay | Admitting: Family Medicine

## 2015-10-01 ENCOUNTER — Telehealth: Payer: Self-pay | Admitting: Family Medicine

## 2015-10-01 ENCOUNTER — Ambulatory Visit (INDEPENDENT_AMBULATORY_CARE_PROVIDER_SITE_OTHER): Payer: Medicare Other | Admitting: Family Medicine

## 2015-10-01 VITALS — BP 176/106 | HR 88 | Temp 98.0°F | Ht 64.0 in | Wt 159.0 lb

## 2015-10-01 DIAGNOSIS — N289 Disorder of kidney and ureter, unspecified: Secondary | ICD-10-CM | POA: Diagnosis not present

## 2015-10-01 DIAGNOSIS — I10 Essential (primary) hypertension: Secondary | ICD-10-CM | POA: Diagnosis not present

## 2015-10-01 DIAGNOSIS — R5383 Other fatigue: Secondary | ICD-10-CM

## 2015-10-01 DIAGNOSIS — H6121 Impacted cerumen, right ear: Secondary | ICD-10-CM

## 2015-10-01 DIAGNOSIS — R531 Weakness: Secondary | ICD-10-CM

## 2015-10-01 LAB — POCT URINALYSIS DIPSTICK
BILIRUBIN UA: NEGATIVE
GLUCOSE UA: NEGATIVE
KETONES UA: NEGATIVE
Nitrite, UA: POSITIVE
Protein, UA: NEGATIVE
Spec Grav, UA: 1.01
Urobilinogen, UA: NEGATIVE
pH, UA: 6

## 2015-10-01 LAB — POCT UA - MICROSCOPIC ONLY
CASTS, UR, LPF, POC: NEGATIVE
Crystals, Ur, HPF, POC: NEGATIVE
MUCUS UA: NEGATIVE
Yeast, UA: NEGATIVE

## 2015-10-01 NOTE — Progress Notes (Signed)
Subjective:    Patient ID: Emily Wagner, female    DOB: 10/21/1933, 81 y.o.   MRN: [id number]  HPI Patient here today for "weak spells" and fatigue. The last time that I saw the patient in July the blood pressure was 131/88. The patient indicates that she has not felt good since she fell and had a pelvic fracture in early July and spent time in a rehabilitation center for about 3 weeks to recover from this. She is felt weak since that time. She does bring in some blood pressures for review and those readings are always better at home than they are in the office. Her readings at home most recently were 130/83. She denies any chest pain or shortness of breath. She's not having any problems with her GI tract blood in the stool black tarry bowel movements etc. No heartburn. She is passing her water without problems. She does have a history of chronic urinary tract infections and the urinalysis that has already been done for today was clear of any significant pyuria. She says she's not having any symptoms with this and can usually tell when she is having problems.     Patient Active Problem List   Diagnosis Date Noted  . UTI (urinary tract infection) 06/02/2015  . Acute respiratory failure with hypoxia ([place]) 06/02/2015  . Pelvic fracture ([place]) 05/30/2015  . Allergy to multiple medications 07/15/2013  . Renal insufficiency   . Adjustment disorder with mixed anxiety and depressed mood   . Osteoporosis   . ASCVD (arteriosclerotic cardiovascular disease)   . 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  . HYPERCHOLESTEROLEMIA 05/02/2008  . Essential hypertension 05/02/2008  . INTERSTITIAL CYSTITIS 05/02/2008  . DIVERTICULOSIS OF COLON 08/27/2003   Outpatient Encounter Prescriptions as of 10/01/2015  Medication Sig  . aspirin EC 81 MG tablet Take 81 mg by mouth daily.  . calcium carbonate (OS-CAL) 600 MG TABS tablet Take 600 mg by mouth daily with breakfast.  . Cholecalciferol (VITAMIN D) 2000 UNITS CAPS  Take 1 capsule by mouth daily.  . fexofenadine-pseudoephedrine (ALLEGRA-D 24) 180-240 MG per 24 hr tablet Take 1 tablet by mouth daily.  Marland Kitchen levothyroxine (SYNTHROID, LEVOTHROID) 50 MCG tablet Take 2 tablets (100 mcg total) by mouth daily.  Marland Kitchen LORazepam (ATIVAN) 0.5 MG tablet Take 1 tablet (0.5 mg total) by mouth 2 (two) times daily as needed for anxiety.  . meclizine (ANTIVERT) 12.5 MG tablet Take 1 tablet (12.5 mg total) by mouth 2 (two) times daily as needed for dizziness.  . Multiple Vitamins-Minerals (PRESERVISION AREDS 2) CAPS Take 1 capsule by mouth daily.  Vladimir Faster Glycol-Propyl Glycol (SYSTANE OP) Apply 1 drop to eye daily.  . verapamil (CALAN) 120 MG tablet 1 I/2 tab bid (Patient taking differently: 120 mg. Take 1/2 tab in AM and Whole in the afternoon)   No facility-administered encounter medications on file as of 10/01/2015.      Review of Systems  Constitutional: Positive for fatigue.  HENT: Negative.   Eyes: Negative.   Respiratory: Negative.   Cardiovascular: Negative.   Gastrointestinal: Negative.   Endocrine: Negative.   Genitourinary: Negative.   Musculoskeletal: Negative.   Skin: Negative.   Allergic/Immunologic: Negative.   Neurological: Positive for weakness (off / on ).  Hematological: Negative.   Psychiatric/Behavioral: Negative.        Objective:   Physical Exam  Constitutional: She is oriented to person, place, and time. She appears well-developed and well-nourished.  HENT:  Head: Normocephalic and atraumatic.  Left  Ear: External ear normal.  Nose: Nose normal.  Mouth/Throat: Oropharynx is clear and moist.  Ears cerumen right ear  Eyes: Conjunctivae and EOM are normal. Pupils are equal, round, and reactive to light. Right eye exhibits no discharge. Left eye exhibits no discharge. No scleral icterus.  Neck: Normal range of motion. Neck supple. No JVD present. No thyromegaly present.  Cardiovascular: Normal rate, regular rhythm, normal heart sounds and  intact distal pulses.   No murmur heard. At 72/m  Pulmonary/Chest: Effort normal and breath sounds normal. No respiratory distress. She has no wheezes. She has no rales. She exhibits no tenderness.  Clear anteriorly and posteriorly  Abdominal: Soft. Bowel sounds are normal. She exhibits no mass. There is no tenderness. There is no rebound and no guarding.  Musculoskeletal: Normal range of motion. She exhibits no edema.  Lymphadenopathy:    She has no cervical adenopathy.  Neurological: She is alert and oriented to person, place, and time.  Skin: Skin is warm and dry. No rash noted.  Psychiatric: She has a normal mood and affect. Her behavior is normal. Judgment and thought content normal.  Nursing note and vitals reviewed.  BP 176/106 mmHg  Pulse 88  Temp(Src) 98 F (36.7 C) (Oral)  Ht _0  (1.626 m)  Wt 159 lb (72.122 kg)  BMI 27.28 kg/m2  LMP 03/21/1971  Repeat blood pressure 148/100 right arm large cuff supine Results for orders placed or performed in visit on 10/01/15  POCT urinalysis dipstick  Result Value Ref Range   Color, UA yellow    Clarity, UA clear    Glucose, UA neg    Bilirubin, UA neg    Ketones, UA neg    Spec Grav, UA 1.010    Blood, UA mod    pH, UA 6.0    Protein, UA neg    Urobilinogen, UA negative    Nitrite, UA pos    Leukocytes, UA small (1+) (A) Negative  POCT UA - Microscopic Only  Result Value Ref Range   WBC, Ur, HPF, POC rare    RBC, urine, microscopic 1-3    Bacteria, U Microscopic mod    Mucus, UA neg    Epithelial cells, urine per micros few    Crystals, Ur, HPF, POC neg    Casts, Ur, LPF, POC neg    Yeast, UA neg         Assessment & Plan:  1. Other fatigue - POCT urinalysis dipstick - POCT UA - Microscopic Only - BMP8+EGFR - CBC with Differential/Platelet - Thyroid Panel With TSH  2. Weakness generalized - POCT urinalysis dipstick - POCT UA - Microscopic Only - BMP8+EGFR - CBC with Differential/Platelet - Thyroid  Panel With TSH  3. Essential hypertension -Increase verapamil and take 120 in the morning and 120 in the evening  4. Renal insufficiency  5. Right ear impacted cerumen -Debrox ear softener use as directed  Patient Instructions  The patient will increase her verapamil to 1 whole pill in the morning of the 120 mg in 1 whole pill in the evening. She will check her blood pressure twice daily and bring these in for review the middle of next week She'll continue to watch her sodium intake She should continue to be careful and not put yourself at risk for falling We will review her lab work at that time she comes in to recheck her blood pressure with the additional medication on board Use Debrox eardrops 3-4 drops to the right ear  canal for 3 nights in a row wait 1 week and repeat in return to the office in a couple weeks and have the nurse irrigate the cerumen out. The Debrox will soften earwax to make cerumen removal more easily done.   Arrie Senate MD

## 2015-10-01 NOTE — Telephone Encounter (Signed)
Having weak spells and fatigue a lot - thinks she needs to come sooner.  appt made

## 2015-10-01 NOTE — Patient Instructions (Addendum)
The patient will increase her verapamil to 1 whole pill in the morning of the 120 mg in 1 whole pill in the evening. She will check her blood pressure twice daily and bring these in for review the middle of next week She'll continue to watch her sodium intake She should continue to be careful and not put yourself at risk for falling We will review her lab work at that time she comes in to recheck her blood pressure with the additional medication on board Use Debrox eardrops 3-4 drops to the right ear canal for 3 nights in a row wait 1 week and repeat in return to the office in a couple weeks and have the nurse irrigate the cerumen out. The Debrox will soften earwax to make cerumen removal more easily done.

## 2015-10-02 LAB — BMP8+EGFR
BUN/Creatinine Ratio: 17 (ref 11–26)
BUN: 15 mg/dL (ref 8–27)
CHLORIDE: 99 mmol/L (ref 97–106)
CO2: 23 mmol/L (ref 18–29)
Calcium: 9 mg/dL (ref 8.7–10.3)
Creatinine, Ser: 0.86 mg/dL (ref 0.57–1.00)
GFR calc Af Amer: 74 mL/min/{1.73_m2} (ref >59)
GFR calc non Af Amer: 64 mL/min/{1.73_m2} (ref >59)
Glucose: 77 mg/dL (ref 65–99)
Potassium: 3.9 mmol/L (ref 3.5–5.2)
Sodium: 140 mmol/L (ref 136–144)

## 2015-10-02 LAB — THYROID PANEL WITH TSH
FREE THYROXINE INDEX: 2.1 (ref 1.2–4.9)
T3 Uptake Ratio: 26 % (ref 24–39)
T4, Total: 7.9 ug/dL (ref 4.5–12.0)
TSH: 1.93 u[IU]/mL (ref 0.450–4.500)

## 2015-10-02 LAB — CBC WITH DIFFERENTIAL/PLATELET
BASOS ABS: 0 10*3/uL (ref 0.0–0.2)
Basos: 0 %
EOS (ABSOLUTE): 0.2 10*3/uL (ref 0.0–0.4)
Eos: 2 %
Hematocrit: 44.8 % (ref 34.0–46.6)
Hemoglobin: 15 g/dL (ref 11.1–15.9)
Immature Grans (Abs): 0 10*3/uL (ref 0.0–0.1)
Immature Granulocytes: 0 %
LYMPHS: 24 %
Lymphocytes Absolute: 1.8 10*3/uL (ref 0.7–3.1)
MCH: 31 pg (ref 26.6–33.0)
MCHC: 33.5 g/dL (ref 31.5–35.7)
MCV: 93 fL (ref 79–97)
MONOS ABS: 0.7 10*3/uL (ref 0.1–0.9)
Monocytes: 10 %
NEUTROS ABS: 4.7 10*3/uL (ref 1.4–7.0)
Neutrophils: 64 %
PLATELETS: 262 10*3/uL (ref 150–379)
RBC: 4.84 x10E6/uL (ref 3.77–5.28)
RDW: 13.1 % (ref 12.3–15.4)
WBC: 7.4 10*3/uL (ref 3.4–10.8)

## 2015-10-04 ENCOUNTER — Telehealth: Payer: Self-pay | Admitting: Family Medicine

## 2015-10-04 NOTE — Telephone Encounter (Signed)
Patient aware of results and verbalizes understanding.  

## 2015-10-05 ENCOUNTER — Telehealth: Payer: Self-pay | Admitting: Family Medicine

## 2015-10-05 DIAGNOSIS — R0602 Shortness of breath: Secondary | ICD-10-CM

## 2015-10-05 DIAGNOSIS — R0789 Other chest pain: Secondary | ICD-10-CM

## 2015-10-05 NOTE — Telephone Encounter (Signed)
Pt had another episode yesterday  Today feels ok  Wants a referral to Dr Percival Spanish for evaluation  She is scheduled to see [facility] tomorrow , but wonders if we can just do the referral and she can skip the appt here. ?? Please address

## 2015-10-05 NOTE — Telephone Encounter (Signed)
Patient states that she had another spell yesterday and was having sob and chest pain. Patient advised she needed to be seen but she wanted to speak with Roselyn Reef.

## 2015-10-05 NOTE — Telephone Encounter (Signed)
Please get the appointment with Dr. Percival Spanish to be seen tomorrow if possible

## 2015-10-06 ENCOUNTER — Ambulatory Visit: Payer: Medicare Other | Admitting: Family Medicine

## 2015-10-07 ENCOUNTER — Telehealth: Payer: Self-pay | Admitting: Family Medicine

## 2015-10-07 ENCOUNTER — Encounter: Payer: Self-pay | Admitting: Family Medicine

## 2015-10-07 NOTE — Telephone Encounter (Signed)
Stp and advised the referral has been put in and it can take up to 7 business days but as soon as we get the appt set up we will call pt or Hochrein's office will call with the appt details. Pt voiced understanding.

## 2015-10-11 ENCOUNTER — Other Ambulatory Visit: Payer: Self-pay | Admitting: Family Medicine

## 2015-10-11 ENCOUNTER — Telehealth: Payer: Self-pay | Admitting: Family Medicine

## 2015-10-11 NOTE — Telephone Encounter (Signed)
Pt checking on referral

## 2015-10-12 ENCOUNTER — Telehealth: Payer: Self-pay | Admitting: Family Medicine

## 2015-10-12 NOTE — Telephone Encounter (Signed)
Last seen 10/01/15  [facility]  If approved print for mail order

## 2015-10-12 NOTE — Telephone Encounter (Signed)
[place] that appt has been made

## 2015-10-13 ENCOUNTER — Ambulatory Visit: Payer: Medicare Other | Admitting: Family Medicine

## 2015-10-21 ENCOUNTER — Emergency Department (HOSPITAL_COMMUNITY): Payer: Medicare Other

## 2015-10-21 ENCOUNTER — Encounter (HOSPITAL_COMMUNITY): Payer: Self-pay | Admitting: *Deleted

## 2015-10-21 ENCOUNTER — Emergency Department (HOSPITAL_COMMUNITY)
Admission: EM | Admit: 2015-10-21 | Discharge: 2015-10-21 | Disposition: A | Discharge status: Home / Self Care | Payer: Medicare Other | Attending: Emergency Medicine | Admitting: Emergency Medicine

## 2015-10-21 DIAGNOSIS — I2510 Atherosclerotic heart disease of native coronary artery without angina pectoris: Secondary | ICD-10-CM | POA: Insufficient documentation

## 2015-10-21 DIAGNOSIS — Z7982 Long term (current) use of aspirin: Secondary | ICD-10-CM | POA: Diagnosis not present

## 2015-10-21 DIAGNOSIS — Z79899 Other long term (current) drug therapy: Secondary | ICD-10-CM | POA: Insufficient documentation

## 2015-10-21 DIAGNOSIS — Z8659 Personal history of other mental and behavioral disorders: Secondary | ICD-10-CM | POA: Insufficient documentation

## 2015-10-21 DIAGNOSIS — E785 Hyperlipidemia, unspecified: Secondary | ICD-10-CM | POA: Insufficient documentation

## 2015-10-21 DIAGNOSIS — I119 Hypertensive heart disease without heart failure: Secondary | ICD-10-CM | POA: Insufficient documentation

## 2015-10-21 DIAGNOSIS — M8580 Other specified disorders of bone density and structure, unspecified site: Secondary | ICD-10-CM | POA: Insufficient documentation

## 2015-10-21 DIAGNOSIS — R0789 Other chest pain: Secondary | ICD-10-CM | POA: Diagnosis not present

## 2015-10-21 DIAGNOSIS — Z8742 Personal history of other diseases of the female genital tract: Secondary | ICD-10-CM | POA: Insufficient documentation

## 2015-10-21 DIAGNOSIS — R079 Chest pain, unspecified: Secondary | ICD-10-CM | POA: Diagnosis present

## 2015-10-21 LAB — BASIC METABOLIC PANEL
Anion gap: 8 (ref 5–15)
BUN: 18 mg/dL (ref 6–20)
CO2: 24 mmol/L (ref 22–32)
Calcium: 9.4 mg/dL (ref 8.9–10.3)
Chloride: 107 mmol/L (ref 101–111)
Creatinine, Ser: 0.97 mg/dL (ref 0.44–1.00)
GFR calc non Af Amer: 54 mL/min — ABNORMAL LOW (ref >60)
Glucose, Bld: 82 mg/dL (ref 65–99)
POTASSIUM: 3.7 mmol/L (ref 3.5–5.1)
Sodium: 139 mmol/L (ref 135–145)

## 2015-10-21 LAB — I-STAT TROPONIN, ED
TROPONIN I, POC: 0 ng/mL (ref 0.00–0.08)
Troponin i, poc: 0 ng/mL (ref 0.00–0.08)

## 2015-10-21 LAB — CBC
HEMATOCRIT: 43.9 % (ref 36.0–46.0)
Hemoglobin: 14.7 g/dL (ref 12.0–15.0)
MCH: 31.8 pg (ref 26.0–34.0)
MCHC: 33.5 g/dL (ref 30.0–36.0)
MCV: 95 fL (ref 78.0–100.0)
Platelets: 206 10*3/uL (ref 150–400)
RBC: 4.62 MIL/uL (ref 3.87–5.11)
RDW: 13.1 % (ref 11.5–15.5)
WBC: 6 10*3/uL (ref 4.0–10.5)

## 2015-10-21 MED ORDER — NITROGLYCERIN 0.4 MG SL SUBL
0.4000 mg | SUBLINGUAL_TABLET | SUBLINGUAL | Status: DC | PRN
  Filled 2015-10-21: qty 1

## 2015-10-21 MED ORDER — ASPIRIN 81 MG PO CHEW
324.0000 mg | CHEWABLE_TABLET | Freq: Once | ORAL | Status: AC
  Administered 2015-10-21: 324 mg via ORAL
  Filled 2015-10-21: qty 4

## 2015-10-21 NOTE — ED Provider Notes (Signed)
CSN: [id number]     Arrival date & time 10/21/15  1710 History   First MD Initiated Contact with Patient 10/21/15 1842     Chief Complaint  Patient presents with  . Chest Pain     (Consider location/radiation/quality/duration/timing/severity/associated sxs/prior Treatment) Patient is a 81 y.o. female presenting with chest pain. The history is provided by the patient. No language interpreter was used.  Chest Pain Pain location:  L chest Pain radiates to:  Upper back Pain radiates to the back: yes   Pain severity:  Mild Timing:  Constant Progression:  Worsening Chronicity:  New Context: not breathing, not lifting and no movement   Relieved by:  Nothing Worsened by:  Nothing tried Ineffective treatments:  None tried Associated symptoms: no back pain, no numbness and no orthopnea   Risk factors: hypertension   Pt reports she had an episode of left sided pain today  (anterior chest axilla/area) Symtpms have resolved before evaluation.  Pt reports she came in to be checked because 9 siblings have heart disease and have had heart attacks. Pt worried that she could have infection  Past Medical History  Diagnosis Date  . Renal insufficiency   . Other and unspecified hyperlipidemia   . Benign hypertensive heart disease without heart failure   . Adjustment disorder with mixed anxiety and depressed mood   . Other dyspnea and respiratory abnormality   . Hematuria   . Interstitial cystitis   . Osteopenia   . ASCVD (arteriosclerotic cardiovascular disease)    Past Surgical History  Procedure Laterality Date  . Fracture surgery      Right hip  . Cholecystectomy    . Hernia repair      Right inguinal  . Abdominal hysterectomy      Cancer and endometriosis   No family history on file. Social History  Substance Use Topics  . Smoking status: Never Smoker   . Smokeless tobacco: None  . Alcohol Use: No   OB History    No data available     Review of Systems  Cardiovascular:  Positive for chest pain. Negative for orthopnea.  Musculoskeletal: Negative for back pain.  Neurological: Negative for numbness.  All other systems reviewed and are negative.     Allergies  Alendronate sodium; Aspirin free childs; Ciprofloxacin; Codeine; Crestor; Darvon; Flonase; Lipitor; Nitrofurantoin monohyd macro; Pravachol; Prevnar 13; Sulfa antibiotics; Ultram; Welchol; Zetia; and Zocor  Home Medications   Prior to Admission medications   Medication Sig Start Date End Date Taking? Authorizing Provider  aspirin EC 81 MG tablet Take 81 mg by mouth daily.   Yes Historical Provider, MD  calcium carbonate (OS-CAL) 600 MG TABS tablet Take 600 mg by mouth daily with breakfast.   Yes Historical Provider, MD  Cholecalciferol (VITAMIN D) 2000 UNITS CAPS Take 1 capsule by mouth daily.   Yes Historical Provider, MD  diphenhydrAMINE (BENADRYL) 25 MG tablet Take 12.5 mg by mouth every 6 (six) hours as needed (shaking).   Yes Historical Provider, MD  fexofenadine (ALLEGRA) 180 MG tablet Take 180 mg by mouth daily.   Yes Historical Provider, MD  levothyroxine (SYNTHROID, LEVOTHROID) 50 MCG tablet Take 2 tablets (100 mcg total) by mouth daily. 09/10/15  Yes Chipper Herb, MD  LORazepam (ATIVAN) 0.5 MG tablet TAKE 1 BY MOUTH TWICE DAILYAS NEEDED FOR ANXIETY 10/12/15  Yes Chipper Herb, MD  meclizine (ANTIVERT) 12.5 MG tablet Take 1 tablet (12.5 mg total) by mouth 2 (two) times daily as needed for  dizziness. 06/24/15  Yes Chipper Herb, MD  Multiple Vitamins-Minerals (PRESERVISION AREDS PO) Take 1 tablet by mouth daily.   Yes Historical Provider, MD  verapamil (CALAN) 120 MG tablet 1 I/2 tab bid Patient taking differently: 120 mg. Take 1/2 tab in AM and Whole in the afternoon 02/10/15  Yes Chipper Herb, MD   BP 146/86 mmHg  Pulse 66  Temp(Src) 97.8 F (36.6 C) (Oral)  Resp 14  Ht 5\' 4"  (1.626 m)  Wt 70.308 kg  BMI 26.59 kg/m2  SpO2 96%  LMP 03/21/1971 Physical Exam  Constitutional: She is  oriented to person, place, and time. She appears well-developed and well-nourished.  HENT:  Head: Normocephalic and atraumatic.  Eyes: EOM are normal.  Neck: Normal range of motion.  Cardiovascular: Normal rate and normal heart sounds.   Pulmonary/Chest: Effort normal.  Abdominal: Soft. She exhibits no distension.  Musculoskeletal: Normal range of motion.  Neurological: She is alert and oriented to person, place, and time.  Psychiatric: She has a normal mood and affect.  Nursing note and vitals reviewed.   ED Course  Procedures (including critical care time) Labs Review Labs Reviewed  BASIC METABOLIC PANEL - Abnormal; Notable for the following:    GFR calc non Af Amer 54 (*)    All other components within normal limits  CBC  I-STAT TROPOININ, ED  Randolm Idol, ED    Imaging Review Dg Chest 2 View  10/21/2015  CLINICAL DATA:  Left-sided chest pain. Shortness of breath and fatigue EXAM: CHEST  2 VIEW COMPARISON:  June 02, 2015 FINDINGS: There is patchy bibasilar atelectasis. Lungs elsewhere are clear. Heart size and pulmonary vascularity are normal. There is atherosclerotic calcification in the aorta. No adenopathy. No pneumothorax. There is degenerative change in the thoracic spine. There is calcification in the right carotid artery. There are surgical clips in the right upper quadrant of the abdomen. IMPRESSION: Patchy bibasilar atelectasis. No edema or consolidation. Calcification is noted in the right carotid artery. Electronically Signed   By: Lowella Grip III M.D.   On: 10/21/2015 17:45   I have personally reviewed and evaluated these images and lab results as part of my medical decision-making.   EKG Interpretation   Date/Time:  Thursday October 21 2015 17:16:39 EST Ventricular Rate:  82 PR Interval:  176 QRS Duration: 74 QT Interval:  406 QTC Calculation: 474 R Axis:   -49 Text Interpretation:  Normal sinus rhythm Left axis deviation Inferior  infarct , age  undetermined Anterior infarct , age undetermined Abnormal  ECG Confirmed by DELO  MD, DOUGLAS ([id number]) on 10/21/2015 10:12:25 PM      MDM Pt denies any current chest pain.   Pt wants to go home.     Final diagnoses:  Chest discomfort    Pt advised to see her cardiologist for evaluation. Pt has an appointment to see Dr. Percival Spanish in 12/2.   [place], PA-C 10/21/15 [place], MD 10/21/15 [phone]

## 2015-10-21 NOTE — Discharge Instructions (Signed)
Nonspecific Chest Pain  °Chest pain can be caused by many different conditions. There is always a chance that your pain could be related to something serious, such as a heart attack or a blood clot in your lungs. Chest pain can also be caused by conditions that are not life-threatening. If you have chest pain, it is very important to follow up with your health care provider. °CAUSES  °Chest pain can be caused by: °· Heartburn. °· Pneumonia or bronchitis. °· Anxiety or stress. °· Inflammation around your heart (pericarditis) or lung (pleuritis or pleurisy). °· A blood clot in your lung. °· A collapsed lung (pneumothorax). It can develop suddenly on its own (spontaneous pneumothorax) or from trauma to the chest. °· Shingles infection (varicella-zoster virus). °· Heart attack. °· Damage to the bones, muscles, and cartilage that make up your chest wall. This can include: °¨ Bruised bones due to injury. °¨ Strained muscles or cartilage due to frequent or repeated coughing or overwork. °¨ Fracture to one or more ribs. °¨ Sore cartilage due to inflammation (costochondritis). °RISK FACTORS  °Risk factors for chest pain may include: °· Activities that increase your risk for trauma or injury to your chest. °· Respiratory infections or conditions that cause frequent coughing. °· Medical conditions or overeating that can cause heartburn. °· Heart disease or family history of heart disease. °· Conditions or health behaviors that increase your risk of developing a blood clot. °· Having had chicken pox (varicella zoster). °SIGNS AND SYMPTOMS °Chest pain can feel like: °· Burning or tingling on the surface of your chest or deep in your chest. °· Crushing, pressure, aching, or squeezing pain. °· Dull or sharp pain that is worse when you move, cough, or take a deep breath. °· Pain that is also felt in your back, neck, shoulder, or arm, or pain that spreads to any of these areas. °Your chest pain may come and go, or it may stay  constant. °DIAGNOSIS °Lab tests or other studies may be needed to find the cause of your pain. Your health care provider may have you take a test called an ambulatory ECG (electrocardiogram). An ECG records your heartbeat patterns at the time the test is performed. You may also have other tests, such as: °· Transthoracic echocardiogram (TTE). During echocardiography, sound waves are used to create a picture of all of the heart structures and to look at how blood flows through your heart. °· Transesophageal echocardiogram (TEE). This is a more advanced imaging test that obtains images from inside your body. It allows your health care provider to see your heart in finer detail. °· Cardiac monitoring. This allows your health care provider to monitor your heart rate and rhythm in real time. °· Holter monitor. This is a portable device that records your heartbeat and can help to diagnose abnormal heartbeats. It allows your health care provider to track your heart activity for several days, if needed. °· Stress tests. These can be done through exercise or by taking medicine that makes your heart beat more quickly. °· Blood tests. °· Imaging tests. °TREATMENT  °Your treatment depends on what is causing your chest pain. Treatment may include: °· Medicines. These may include: °¨ Acid blockers for heartburn. °¨ Anti-inflammatory medicine. °¨ Pain medicine for inflammatory conditions. °¨ Antibiotic medicine, if an infection is present. °¨ Medicines to dissolve blood clots. °¨ Medicines to treat coronary artery disease. °· Supportive care for conditions that do not require medicines. This may include: °¨ Resting. °¨ Applying heat   or cold packs to injured areas. °¨ Limiting activities until pain decreases. °HOME CARE INSTRUCTIONS °· If you were prescribed an antibiotic medicine, finish it all even if you start to feel better. °· Avoid any activities that bring on chest pain. °· Do not use any tobacco products, including  cigarettes, chewing tobacco, or electronic cigarettes. If you need help quitting, ask your health care provider. °· Do not drink alcohol. °· Take medicines only as directed by your health care provider. °· Keep all follow-up visits as directed by your health care provider. This is important. This includes any further testing if your chest pain does not go away. °· If heartburn is the cause for your chest pain, you may be told to keep your head raised (elevated) while sleeping. This reduces the chance that acid will go from your stomach into your esophagus. °· Make lifestyle changes as directed by your health care provider. These may include: °¨ Getting regular exercise. Ask your health care provider to suggest some activities that are safe for you. °¨ Eating a heart-healthy diet. A registered dietitian can help you to learn healthy eating options. °¨ Maintaining a healthy weight. °¨ Managing diabetes, if necessary. °¨ Reducing stress. °SEEK MEDICAL CARE IF: °· Your chest pain does not go away after treatment. °· You have a rash with blisters on your chest. °· You have a fever. °SEEK IMMEDIATE MEDICAL CARE IF:  °· Your chest pain is worse. °· You have an increasing cough, or you cough up blood. °· You have severe abdominal pain. °· You have severe weakness. °· You faint. °· You have chills. °· You have sudden, unexplained chest discomfort. °· You have sudden, unexplained discomfort in your arms, back, neck, or jaw. °· You have shortness of breath at any time. °· You suddenly start to sweat, or your skin gets clammy. °· You feel nauseous or you vomit. °· You suddenly feel light-headed or dizzy. °· Your heart begins to beat quickly, or it feels like it is skipping beats. °These symptoms may represent a serious problem that is an emergency. Do not wait to see if the symptoms will go away. Get medical help right away. Call your local emergency services (911 in the [place] Do not drive yourself to the hospital. °  °This  information is not intended to replace advice given to you by your health care provider. Make sure you discuss any questions you have with your health care provider. °  °Document Released: 08/23/2005 Document Revised: 12/04/2014 Document Reviewed: 06/19/2014 °Elsevier Interactive Patient Education ©2016 [employer] ° °

## 2015-10-21 NOTE — ED Notes (Signed)
Patient verbalized understanding of discharge instructions and denies any further needs nor questions at this time. Patient states she will call her cardiologist in the morning and try and get a sooner appointment than what is scheduled. VS stable.

## 2015-10-21 NOTE — ED Notes (Signed)
Pt c/o recurrent CP on left chest that radiates to left arm and back.

## 2015-10-28 DIAGNOSIS — M8580 Other specified disorders of bone density and structure, unspecified site: Secondary | ICD-10-CM | POA: Insufficient documentation

## 2015-10-28 DIAGNOSIS — I119 Hypertensive heart disease without heart failure: Secondary | ICD-10-CM | POA: Insufficient documentation

## 2015-10-28 DIAGNOSIS — N3010 Interstitial cystitis (chronic) without hematuria: Secondary | ICD-10-CM | POA: Insufficient documentation

## 2015-10-29 ENCOUNTER — Encounter (HOSPITAL_COMMUNITY): Payer: Self-pay | Admitting: Nurse Practitioner

## 2015-10-29 ENCOUNTER — Encounter: Payer: Self-pay | Admitting: Cardiology

## 2015-10-29 ENCOUNTER — Ambulatory Visit (INDEPENDENT_AMBULATORY_CARE_PROVIDER_SITE_OTHER): Payer: Medicare Other | Admitting: Cardiology

## 2015-10-29 ENCOUNTER — Inpatient Hospital Stay (HOSPITAL_COMMUNITY)
Admission: AD | Admit: 2015-10-29 | Discharge: 2015-11-08 | DRG: 234 | Disposition: A | Discharge status: Home Health | Payer: Medicare Other | Source: Ambulatory Visit | Attending: Thoracic Surgery (Cardiothoracic Vascular Surgery) | Admitting: Thoracic Surgery (Cardiothoracic Vascular Surgery)

## 2015-10-29 ENCOUNTER — Encounter (HOSPITAL_COMMUNITY)
Admission: AD | Disposition: A | Discharge status: Home Health | Payer: Medicare Other | Source: Ambulatory Visit | Attending: Thoracic Surgery (Cardiothoracic Vascular Surgery)

## 2015-10-29 VITALS — BP 138/84 | HR 87 | Ht 64.0 in | Wt 162.3 lb

## 2015-10-29 DIAGNOSIS — Z951 Presence of aortocoronary bypass graft: Secondary | ICD-10-CM

## 2015-10-29 DIAGNOSIS — F419 Anxiety disorder, unspecified: Secondary | ICD-10-CM | POA: Diagnosis present

## 2015-10-29 DIAGNOSIS — Z8249 Family history of ischemic heart disease and other diseases of the circulatory system: Secondary | ICD-10-CM | POA: Diagnosis not present

## 2015-10-29 DIAGNOSIS — R079 Chest pain, unspecified: Secondary | ICD-10-CM

## 2015-10-29 DIAGNOSIS — F4323 Adjustment disorder with mixed anxiety and depressed mood: Secondary | ICD-10-CM | POA: Diagnosis present

## 2015-10-29 DIAGNOSIS — E876 Hypokalemia: Secondary | ICD-10-CM | POA: Diagnosis not present

## 2015-10-29 DIAGNOSIS — I480 Paroxysmal atrial fibrillation: Secondary | ICD-10-CM | POA: Diagnosis not present

## 2015-10-29 DIAGNOSIS — Z79899 Other long term (current) drug therapy: Secondary | ICD-10-CM | POA: Diagnosis not present

## 2015-10-29 DIAGNOSIS — D62 Acute posthemorrhagic anemia: Secondary | ICD-10-CM | POA: Diagnosis not present

## 2015-10-29 DIAGNOSIS — I200 Unstable angina: Secondary | ICD-10-CM | POA: Diagnosis present

## 2015-10-29 DIAGNOSIS — E785 Hyperlipidemia, unspecified: Secondary | ICD-10-CM | POA: Diagnosis present

## 2015-10-29 DIAGNOSIS — Z7982 Long term (current) use of aspirin: Secondary | ICD-10-CM

## 2015-10-29 DIAGNOSIS — I10 Essential (primary) hypertension: Secondary | ICD-10-CM | POA: Diagnosis not present

## 2015-10-29 DIAGNOSIS — I119 Hypertensive heart disease without heart failure: Secondary | ICD-10-CM | POA: Diagnosis present

## 2015-10-29 DIAGNOSIS — N390 Urinary tract infection, site not specified: Secondary | ICD-10-CM | POA: Diagnosis not present

## 2015-10-29 DIAGNOSIS — R110 Nausea: Secondary | ICD-10-CM | POA: Diagnosis not present

## 2015-10-29 DIAGNOSIS — Z87440 Personal history of urinary (tract) infections: Secondary | ICD-10-CM | POA: Diagnosis not present

## 2015-10-29 DIAGNOSIS — I25119 Atherosclerotic heart disease of native coronary artery with unspecified angina pectoris: Secondary | ICD-10-CM | POA: Diagnosis not present

## 2015-10-29 DIAGNOSIS — Z90710 Acquired absence of both cervix and uterus: Secondary | ICD-10-CM | POA: Diagnosis not present

## 2015-10-29 DIAGNOSIS — Z9889 Other specified postprocedural states: Secondary | ICD-10-CM

## 2015-10-29 DIAGNOSIS — I472 Ventricular tachycardia: Secondary | ICD-10-CM | POA: Diagnosis not present

## 2015-10-29 DIAGNOSIS — I25110 Atherosclerotic heart disease of native coronary artery with unstable angina pectoris: Secondary | ICD-10-CM | POA: Diagnosis not present

## 2015-10-29 DIAGNOSIS — J9811 Atelectasis: Secondary | ICD-10-CM

## 2015-10-29 DIAGNOSIS — I2510 Atherosclerotic heart disease of native coronary artery without angina pectoris: Secondary | ICD-10-CM | POA: Diagnosis present

## 2015-10-29 DIAGNOSIS — I4891 Unspecified atrial fibrillation: Secondary | ICD-10-CM | POA: Diagnosis not present

## 2015-10-29 DIAGNOSIS — Z9849 Cataract extraction status, unspecified eye: Secondary | ICD-10-CM | POA: Diagnosis not present

## 2015-10-29 DIAGNOSIS — Z833 Family history of diabetes mellitus: Secondary | ICD-10-CM | POA: Diagnosis not present

## 2015-10-29 DIAGNOSIS — Z888 Allergy status to other drugs, medicaments and biological substances status: Secondary | ICD-10-CM

## 2015-10-29 HISTORY — DX: Unspecified atrial fibrillation: I48.91

## 2015-10-29 HISTORY — DX: Fracture of unspecified parts of lumbosacral spine and pelvis, initial encounter for closed fracture: S32.9XXA

## 2015-10-29 HISTORY — PX: CARDIAC CATHETERIZATION: SHX172

## 2015-10-29 HISTORY — DX: Presence of aortocoronary bypass graft: Z95.1

## 2015-10-29 HISTORY — DX: Urinary tract infection, site not specified: N39.0

## 2015-10-29 LAB — BASIC METABOLIC PANEL
Anion gap: 8 (ref 5–15)
BUN: 20 mg/dL (ref 6–20)
CO2: 26 mmol/L (ref 22–32)
Calcium: 9 mg/dL (ref 8.9–10.3)
Chloride: 108 mmol/L (ref 101–111)
Creatinine, Ser: 0.95 mg/dL (ref 0.44–1.00)
GFR calc Af Amer: >60 mL/min (ref >60)
GFR calc non Af Amer: 55 mL/min — ABNORMAL LOW (ref >60)
Glucose, Bld: 83 mg/dL (ref 65–99)
Potassium: 3.9 mmol/L (ref 3.5–5.1)
Sodium: 142 mmol/L (ref 135–145)

## 2015-10-29 LAB — CBC WITH DIFFERENTIAL/PLATELET
Basophils Absolute: 0 10*3/uL (ref 0.0–0.1)
Basophils Relative: 0 %
Eosinophils Absolute: 0.1 10*3/uL (ref 0.0–0.7)
Eosinophils Relative: 2 %
HCT: 43.6 % (ref 36.0–46.0)
Hemoglobin: 14.2 g/dL (ref 12.0–15.0)
Lymphocytes Relative: 24 %
Lymphs Abs: 1.4 10*3/uL (ref 0.7–4.0)
MCH: 30.8 pg (ref 26.0–34.0)
MCHC: 32.6 g/dL (ref 30.0–36.0)
MCV: 94.6 fL (ref 78.0–100.0)
Monocytes Absolute: 0.5 10*3/uL (ref 0.1–1.0)
Monocytes Relative: 10 %
Neutro Abs: 3.7 10*3/uL (ref 1.7–7.7)
Neutrophils Relative %: 64 %
Platelets: 192 10*3/uL (ref 150–400)
RBC: 4.61 MIL/uL (ref 3.87–5.11)
RDW: 13.1 % (ref 11.5–15.5)
WBC: 5.7 10*3/uL (ref 4.0–10.5)

## 2015-10-29 LAB — TSH: TSH: 1.734 u[IU]/mL (ref 0.350–4.500)

## 2015-10-29 LAB — PROTIME-INR
INR: 1.06 (ref 0.00–1.49)
Prothrombin Time: 14 seconds (ref 11.6–15.2)

## 2015-10-29 LAB — TROPONIN I
Troponin I: <0.03 ng/mL (ref <0.031)
Troponin I: <0.03 ng/mL (ref <0.031)

## 2015-10-29 SURGERY — LEFT HEART CATH AND CORONARY ANGIOGRAPHY
Anesthesia: LOCAL

## 2015-10-29 MED ORDER — HEPARIN (PORCINE) IN NACL 2-0.9 UNIT/ML-% IJ SOLN
INTRAMUSCULAR | Status: AC
  Filled 2015-10-29: qty 500

## 2015-10-29 MED ORDER — ASPIRIN 300 MG RE SUPP: 300.0000 mg | RECTAL | Status: DC

## 2015-10-29 MED ORDER — LIDOCAINE HCL (PF) 1 % IJ SOLN
INTRAMUSCULAR | Status: DC | PRN
  Administered 2015-10-29: 17:00:00

## 2015-10-29 MED ORDER — SODIUM CHLORIDE 0.9 % IV SOLN: 250.0000 mL | INTRAVENOUS | Status: DC | PRN

## 2015-10-29 MED ORDER — NITROGLYCERIN IN D5W 200-5 MCG/ML-% IV SOLN: 0.0000 ug/min | INTRAVENOUS | Status: DC

## 2015-10-29 MED ORDER — ONDANSETRON HCL 4 MG/2ML IJ SOLN: 4.0000 mg | Freq: Four times a day (QID) | INTRAMUSCULAR | Status: DC | PRN

## 2015-10-29 MED ORDER — SODIUM CHLORIDE 0.9 % IJ SOLN
3.0000 mL | Freq: Two times a day (BID) | INTRAMUSCULAR | Status: DC
  Administered 2015-10-30 – 2015-11-01 (×5): 3 mL via INTRAVENOUS

## 2015-10-29 MED ORDER — LEVOTHYROXINE SODIUM 100 MCG PO TABS
100.0000 ug | ORAL_TABLET | Freq: Every day | ORAL | Status: DC
  Administered 2015-10-30 – 2015-11-08 (×9): 100 ug via ORAL
  Filled 2015-10-29 (×9): qty 1

## 2015-10-29 MED ORDER — DIPHENHYDRAMINE HCL 25 MG PO TABS
12.5000 mg | ORAL_TABLET | Freq: Four times a day (QID) | ORAL | Status: DC | PRN
  Filled 2015-10-29: qty 0.5

## 2015-10-29 MED ORDER — ACETAMINOPHEN 325 MG PO TABS: 650.0000 mg | ORAL_TABLET | ORAL | Status: DC | PRN

## 2015-10-29 MED ORDER — ATORVASTATIN CALCIUM 80 MG PO TABS: 80.0000 mg | ORAL_TABLET | Freq: Every day | ORAL | Status: DC

## 2015-10-29 MED ORDER — LORATADINE 10 MG PO TABS
10.0000 mg | ORAL_TABLET | Freq: Every day | ORAL | Status: DC
  Administered 2015-10-30 – 2015-11-01 (×3): 10 mg via ORAL
  Filled 2015-10-29 (×3): qty 1

## 2015-10-29 MED ORDER — VERAPAMIL HCL 120 MG PO TABS
120.0000 mg | ORAL_TABLET | Freq: Two times a day (BID) | ORAL | Status: DC
  Administered 2015-10-29 – 2015-11-01 (×7): 120 mg via ORAL
  Filled 2015-10-29 (×10): qty 1

## 2015-10-29 MED ORDER — ASPIRIN 81 MG PO CHEW
324.0000 mg | CHEWABLE_TABLET | ORAL | Status: AC
  Administered 2015-10-29: 324 mg via ORAL
  Filled 2015-10-29: qty 4

## 2015-10-29 MED ORDER — SODIUM CHLORIDE 0.9 % IJ SOLN: 3.0000 mL | INTRAMUSCULAR | Status: DC | PRN

## 2015-10-29 MED ORDER — FENTANYL CITRATE (PF) 100 MCG/2ML IJ SOLN
INTRAMUSCULAR | Status: DC | PRN
  Administered 2015-10-29 (×2): 50 ug via INTRAVENOUS

## 2015-10-29 MED ORDER — ASPIRIN EC 81 MG PO TBEC
81.0000 mg | DELAYED_RELEASE_TABLET | Freq: Every day | ORAL | Status: DC
  Administered 2015-10-30 – 2015-11-01 (×3): 81 mg via ORAL
  Filled 2015-10-29 (×3): qty 1

## 2015-10-29 MED ORDER — MIDAZOLAM HCL 2 MG/2ML IJ SOLN
INTRAMUSCULAR | Status: DC | PRN
  Administered 2015-10-29 (×2): 1 mg via INTRAVENOUS

## 2015-10-29 MED ORDER — IOHEXOL 350 MG/ML SOLN
INTRAVENOUS | Status: DC | PRN
  Administered 2015-10-29: 120 mL via INTRAVENOUS

## 2015-10-29 MED ORDER — CALCIUM CARBONATE 1250 (500 CA) MG PO TABS
1.0000 | ORAL_TABLET | Freq: Every day | ORAL | Status: DC
Start: 2015-10-30 — End: 2015-11-02
  Administered 2015-10-30 – 2015-11-01 (×3): 500 mg via ORAL
  Filled 2015-10-29 (×3): qty 1

## 2015-10-29 MED ORDER — ASPIRIN 81 MG PO CHEW: 81.0000 mg | CHEWABLE_TABLET | Freq: Every day | ORAL | Status: DC

## 2015-10-29 MED ORDER — SODIUM CHLORIDE 0.9 % WEIGHT BASED INFUSION: 3.0000 mL/kg/h | INTRAVENOUS | Status: AC

## 2015-10-29 MED ORDER — LORAZEPAM 0.5 MG PO TABS
0.5000 mg | ORAL_TABLET | Freq: Four times a day (QID) | ORAL | Status: DC | PRN
  Administered 2015-10-29 – 2015-11-01 (×4): 0.5 mg via ORAL
  Filled 2015-10-29 (×4): qty 1

## 2015-10-29 MED ORDER — HEPARIN SODIUM (PORCINE) 1000 UNIT/ML IJ SOLN
INTRAMUSCULAR | Status: DC | PRN
  Administered 2015-10-29: 3500 [IU] via INTRAVENOUS

## 2015-10-29 MED ORDER — LIDOCAINE HCL (PF) 1 % IJ SOLN
INTRAMUSCULAR | Status: AC
  Filled 2015-10-29: qty 30

## 2015-10-29 MED ORDER — NITROGLYCERIN 0.4 MG SL SUBL: 0.4000 mg | SUBLINGUAL_TABLET | SUBLINGUAL | Status: DC | PRN

## 2015-10-29 MED ORDER — SODIUM CHLORIDE 0.9 % IJ SOLN: 3.0000 mL | Freq: Two times a day (BID) | INTRAMUSCULAR | Status: DC

## 2015-10-29 MED ORDER — HEPARIN SODIUM (PORCINE) 1000 UNIT/ML IJ SOLN
INTRAMUSCULAR | Status: AC
  Filled 2015-10-29: qty 1

## 2015-10-29 MED ORDER — HEPARIN (PORCINE) IN NACL 100-0.45 UNIT/ML-% IJ SOLN
1050.0000 [IU]/h | INTRAMUSCULAR | Status: AC
  Administered 2015-10-30: 1050 [IU]/h via INTRAVENOUS
  Administered 2015-10-30: 850 [IU]/h via INTRAVENOUS
  Administered 2015-11-01: 1050 [IU]/h via INTRAVENOUS
  Filled 2015-10-29 (×3): qty 250

## 2015-10-29 MED ORDER — NITROGLYCERIN 2 % TD OINT
0.5000 [in_us] | TOPICAL_OINTMENT | Freq: Four times a day (QID) | TRANSDERMAL | Status: DC
  Administered 2015-10-29 – 2015-11-01 (×14): 0.5 [in_us] via TOPICAL
  Filled 2015-10-29: qty 30

## 2015-10-29 MED ORDER — VERAPAMIL HCL 2.5 MG/ML IV SOLN
INTRAVENOUS | Status: DC | PRN
  Administered 2015-10-29: 10 mL via INTRA_ARTERIAL

## 2015-10-29 MED ORDER — LIDOCAINE HCL (PF) 1 % IJ SOLN
INTRAMUSCULAR | Status: DC | PRN
  Administered 2015-10-29: 2 mL

## 2015-10-29 MED ORDER — MIDAZOLAM HCL 2 MG/2ML IJ SOLN
INTRAMUSCULAR | Status: AC
  Filled 2015-10-29: qty 2

## 2015-10-29 MED ORDER — FENTANYL CITRATE (PF) 100 MCG/2ML IJ SOLN
INTRAMUSCULAR | Status: AC
  Filled 2015-10-29: qty 2

## 2015-10-29 MED ORDER — NITROGLYCERIN 1 MG/10 ML FOR IR/CATH LAB
INTRA_ARTERIAL | Status: AC
  Filled 2015-10-29: qty 10

## 2015-10-29 MED ORDER — SODIUM CHLORIDE 0.9 % IV SOLN
INTRAVENOUS | Status: DC
  Administered 2015-10-29: 16:00:00 via INTRAVENOUS

## 2015-10-29 MED ORDER — VERAPAMIL HCL 2.5 MG/ML IV SOLN
INTRAVENOUS | Status: AC
  Filled 2015-10-29: qty 2

## 2015-10-29 SURGICAL SUPPLY — 11 items
CATH INFINITI 5 FR JL3.5 (CATHETERS) ×2 IMPLANT
CATH INFINITI 5FR JL4 (CATHETERS) ×1 IMPLANT
CATH INFINITI JR4 5F (CATHETERS) ×2 IMPLANT
DEVICE RAD COMP TR BAND LRG (VASCULAR PRODUCTS) ×2 IMPLANT
GLIDESHEATH SLEND A-KIT 6F 22G (SHEATH) ×2 IMPLANT
KIT HEART LEFT (KITS) ×2 IMPLANT
PACK CARDIAC CATHETERIZATION (CUSTOM PROCEDURE TRAY) ×2 IMPLANT
TRANSDUCER W/STOPCOCK (MISCELLANEOUS) ×2 IMPLANT
TUBING CIL FLEX 10 FLL-RA (TUBING) ×2 IMPLANT
WIRE HI TORQ VERSACORE-J 145CM (WIRE) ×1 IMPLANT
WIRE SAFE-T 1.5MM-J .035X260CM (WIRE) ×2 IMPLANT

## 2015-10-29 NOTE — Interval H&P Note (Signed)
Cath Lab Visit (complete for each Cath Lab visit)  Clinical Evaluation Leading to the Procedure:   ACS: Yes.    Non-ACS:    Anginal Classification: CCS III  Anti-ischemic medical therapy: Minimal Therapy (1 class of medications)  Non-Invasive Test Results: Low-risk stress test findings: cardiac mortality <1%/year  Prior CABG: No CABG      History and Physical Interval Note:  10/29/2015 4:22 PM  Emily Wagner  has presented today for surgery, with the diagnosis of SOB/Chest Pain   The various methods of treatment have been discussed with the patient and family. After consideration of risks, benefits and other options for treatment, the patient has consented to  Procedure(s): Left Heart Cath and Coronary Angiography (N/A) as a surgical intervention .  The patient's history has been reviewed, patient examined, no change in status, stable for surgery.  I have reviewed the patient's chart and labs.  Questions were answered to the patient's satisfaction.     Sinclair Grooms

## 2015-10-29 NOTE — Progress Notes (Signed)
Cardiology Office Note   Date:  10/29/2015   ID:  Emily Wagner, DOB Apr 08, 1933, MRN [id number]  PCP:  Emily Gainer, MD  Cardiologist:   Minus Breeding, MD   Chief Complaint  Patient presents with  . Chest Pain      History of Present Illness:  Emily Wagner is a 81 y.o. female who presents for evaluation of chest pain.  She does have a history of nonobstructive coronary disease.  She had a cardiac catheterization in 2008. She had proximal calcification in the LAD. There was 70% stenosis at the takeoff of the first diagonal and 60% stenosis in the mid vessel.  She was managed medically. She returns for follow-up having had some chest discomfort. She's been very fatigued recently. A few weeks ago she went pick some turnip greens at a man's vegetable patch. Walking she was very short of breath. On the way back to the car she had sharp chest discomfort. She felt very nauseated. It took her several minutes to recover. She again had chest discomfort on Thanksgiving day actually went to the emergency room. I reviewed these records. There was no objective evidence of ischemia. She was not admitted to the hospital. She has continued to have this mild chest ache constantly but has not done anything to push herself to cause the more intense discomfort that she had recently. She's not had any PND or orthopnea. She's not describing palpitations, presyncope or syncope. She's had no weight gain or edema.  Of note she has a difficult social situation.  She takes care of her disabled daughter.   Past Medical History  Diagnosis Date  . Other and unspecified hyperlipidemia   . Benign hypertensive heart disease without heart failure   . Adjustment disorder with mixed anxiety and depressed mood   . Other dyspnea and respiratory abnormality   . Interstitial cystitis   . Osteopenia   . CAD (coronary artery disease)     60% stenosis LAD, 2008    Past Surgical History  Procedure Laterality  Date  . Fracture surgery      Right hip  . Cholecystectomy    . Hernia repair      Right inguinal  . Abdominal hysterectomy      Cancer and endometriosis  . Cataract extraction       Current Outpatient Prescriptions  Medication Sig Dispense Refill  . aspirin EC 81 MG tablet Take 81 mg by mouth daily.    . calcium carbonate (OS-CAL) 600 MG TABS tablet Take 600 mg by mouth daily with breakfast.    . Cholecalciferol (VITAMIN D) 2000 UNITS CAPS Take 1 capsule by mouth daily.    . diphenhydrAMINE (BENADRYL) 25 MG tablet Take 12.5 mg by mouth every 6 (six) hours as needed (shaking).    . fexofenadine (ALLEGRA) 180 MG tablet Take 180 mg by mouth daily.    Marland Kitchen levothyroxine (SYNTHROID, LEVOTHROID) 50 MCG tablet Take 2 tablets (100 mcg total) by mouth daily. 180 tablet 3  . LORazepam (ATIVAN) 0.5 MG tablet TAKE 1 BY MOUTH TWICE DAILYAS NEEDED FOR ANXIETY 180 tablet 0  . meclizine (ANTIVERT) 12.5 MG tablet Take 1 tablet (12.5 mg total) by mouth 2 (two) times daily as needed for dizziness. 30 tablet 1  . Multiple Vitamins-Minerals (PRESERVISION AREDS PO) Take 1 tablet by mouth daily.    . verapamil (CALAN) 120 MG tablet 1 I/2 tab bid (Patient taking differently: 120 mg. Take 1/2 tab in AM  and Whole in the afternoon) 270 tablet 3   No current facility-administered medications for this visit.    Allergies:   Alendronate sodium; Aspirin free childs; Ciprofloxacin; Codeine; Crestor; Darvon; Flonase; Lipitor; Nitrofurantoin; Nitrofurantoin monohyd macro; Nsaids; Other; Pravachol; Prevnar 13; Propranolol hcl; Sulfa antibiotics; Sulfamethoxazole; Ultram; Welchol; Zetia; and Zocor    Social History:  The patient  reports that she has never smoked. She does not have any smokeless tobacco history on file. She reports that she does not drink alcohol or use illicit drugs.   Family History:  The patient's family history includes CAD (age of onset: 38) in her father; CAD (age of onset: 59) in her daughter; CAD  (age of onset: 61) in her mother; Diabetes type I in her daughter.    ROS:  Please see the history of present illness.   Otherwise, review of systems are positive for none.   All other systems are reviewed and negative.    PHYSICAL EXAM: VS:  BP 138/84 mmHg  Pulse 87  Ht 5\' 4"  (1.626 m)  Wt 162 lb 5 oz (73.624 kg)  BMI 27.85 kg/m2  LMP 03/21/1971 , BMI Body mass index is 27.85 kg/(m^2). GENERAL:  Well appearing HEENT:  Pupils equal round and reactive, fundi not visualized, oral mucosa unremarkable NECK:  No jugular venous distention, waveform within normal limits, carotid upstroke brisk and symmetric, no bruits, no thyromegaly LYMPHATICS:  No cervical, inguinal adenopathy LUNGS:  Clear to auscultation bilaterally BACK:  No CVA tenderness CHEST:  Unremarkable HEART:  PMI not displaced or sustained,S1 and S2 within normal limits, no S3, no S4, no clicks, no rubs, no murmurs ABD:  Flat, positive bowel sounds normal in frequency in pitch, positive abdominal bruits, no rebound, no guarding, no midline pulsatile mass, no hepatomegaly, no splenomegaly EXT:  2 plus pulses throughout, no edema, no cyanosis no clubbing SKIN:  No rashes no nodules NEURO:  Cranial nerves II through XII grossly intact, motor grossly intact throughout PSYCH:  Cognitively intact, oriented to person place and time   EKG:  EKG is ordered today. The ekg ordered today demonstrates sinus rhythm, rate 87, axis within normal limits, old anteroseptal infarct, premature atrial contractions, QTC prolonged, no acute ST-T wave changes.   Recent Labs: 08/03/2015: ALT 8 10/01/2015: TSH 1.930 10/21/2015: BUN 18; Creatinine, Ser 0.97; Hemoglobin 14.7; Platelets 206; Potassium 3.7; Sodium 139    Lipid Panel    Component Value Date/Time   CHOL 193 06/24/2015 0918   CHOL 260* 09/23/2014 1028   TRIG 162* 06/24/2015 0918   TRIG 155* 09/23/2014 1028   HDL 44 06/24/2015 0918   HDL 47 09/23/2014 1028   HDL 45 03/20/2013 0958    CHOLHDL 5.5* 09/23/2014 1028   CHOLHDL 4.4 03/20/2013 0958   VLDL 25 03/20/2013 0958   LDLCALC 182* 09/23/2014 1028   LDLCALC 163* 05/14/2014 1038   LDLCALC 130* 03/20/2013 0958      Wt Readings from Last 3 Encounters:  10/29/15 162 lb 5 oz (73.624 kg)  10/21/15 155 lb (70.308 kg)  10/01/15 159 lb (72.122 kg)      Other studies Reviewed: Additional studies/ records that were reviewed today include: Previous cath, and ED records. Review of the above records demonstrates:  Please see elsewhere in the note.     ASSESSMENT AND PLAN:  CHEST PAIN:  This is consistent with unstable angina. She'll be admitted to the hospital today. She will need cardiac catheterization. The patient understands that risks included but are not  limited to stroke (1 in 1000), death (1 in 86), kidney failure [usually temporary] (1 in 500), bleeding (1 in 200), allergic reaction [possibly serious] (1 in 200).  The patient understands and agrees to proceed.      HTN:  She has had blood pressures that are above target. This can be addressed during the course of her hospitalization.  Current medicines are reviewed at length with the patient today.  The patient does not have concerns regarding medicines.  The following changes have been made:  no change  Labs/ tests ordered today include:  Patient is admitted and cardiac cath is ordered.    Disposition:   FU with after her hospitalization.      Signed, Minus Breeding, MD  10/29/2015 11:38 AM    [facility]

## 2015-10-29 NOTE — Consult Note (Signed)
ANTICOAGULATION CONSULT NOTE - Initial Consult  Pharmacy Consult for Heparin Indication: severe 3v CAD  Allergies  Allergen Reactions  . Alendronate Sodium   . Ciprofloxacin     Patient denies allergy--"I take Cipro for bladder infections"  . Codeine   . Crestor [Rosuvastatin Calcium]   . Darvon   . Flonase [Fluticasone Propionate] Other (See Comments)    headache  . Lipitor [Atorvastatin Calcium]   . Nitrofurantoin Other (See Comments)    Liver failure  . Nitrofurantoin Monohyd Macro Other (See Comments)    elevated liver test  . Nsaids Other (See Comments)    Kidney failure  . Other Other (See Comments)    Most antibiotics require supervision per patient  . Pravachol   . Prevnar 13 [Pneumococcal 13-Val Conj Vacc]     Nerve damage in left arm  . Propranolol Hcl Other (See Comments)    Unknown  . Sulfa Antibiotics   . Sulfamethoxazole Other (See Comments)    Unknown  . Ultram [Tramadol Hcl] Itching  . Welchol [Colesevelam Hcl]   . Zetia [Ezetimibe]   . Zocor [Simvastatin]     Patient Measurements: Height: 5\' 4"  (162.6 cm) Weight: 159 lb 2.8 oz (72.2 kg) IBW/kg (Calculated) : 54.7 Heparin Dosing Weight: 69kg  Vital Signs: Temp: 98 F (36.7 C) (12/02 1730) Temp Source: Oral (12/02 1730) BP: 157/96 mmHg (12/02 1730) Pulse Rate: 89 (12/02 1730)  Labs:  Recent Labs  10/29/15 1355  HGB 14.2  HCT 43.6  PLT 192  LABPROT 14.0  INR 1.06  CREATININE 0.95  TROPONINI <0.03    Estimated Creatinine Clearance: 46 mL/min (by C-G formula based on Cr of 0.95).   Medical History: Past Medical History  Diagnosis Date  . Other and unspecified hyperlipidemia   . Benign hypertensive heart disease without heart failure   . Adjustment disorder with mixed anxiety and depressed mood   . Other dyspnea and respiratory abnormality   . Interstitial cystitis   . Osteopenia   . CAD (coronary artery disease)     60% stenosis LAD, 2008   Assessment: 82yof presented to  outpatient cardiology clinic today with unstable angina. She was admitted for cath. Now s/p cath found to have severe 3 vessel disease. Heparin to start 8 hours post sheath removal. Radial sheath removed at 1700 and TR band applied. CVTS consulted for CABG.  Goal of Therapy:  Heparin level 0.3-0.7 units/ml Monitor platelets by anticoagulation protocol: Yes   Plan:  1) At 0100 on 12/3, begin heparin at 850 units/hr with no bolus 2) Check 8 hour heparin level 3) Daily heparin level and CBC   Deboraha Sprang 10/29/2015,5:45 PM

## 2015-10-29 NOTE — H&P (View-Only) (Signed)
Cardiology Office Note   Date:  10/29/2015   ID:  Emily Wagner, DOB 13-Oct-1933, MRN [id number]  PCP:  Redge Gainer, MD  Cardiologist:   Minus Breeding, MD   Chief Complaint  Patient presents with  . Chest Pain      History of Present Illness:  Emily Wagner is a 81 y.o. female who presents for evaluation of chest pain.  She does have a history of nonobstructive coronary disease.  She had a cardiac catheterization in 2008. She had proximal calcification in the LAD. There was 70% stenosis at the takeoff of the first diagonal and 60% stenosis in the mid vessel.  She was managed medically. She returns for follow-up having had some chest discomfort. She's been very fatigued recently. A few weeks ago she went pick some turnip greens at a man's vegetable patch. Walking she was very short of breath. On the way back to the car she had sharp chest discomfort. She felt very nauseated. It took her several minutes to recover. She again had chest discomfort on Thanksgiving day actually went to the emergency room. I reviewed these records. There was no objective evidence of ischemia. She was not admitted to the hospital. She has continued to have this mild chest ache constantly but has not done anything to push herself to cause the more intense discomfort that she had recently. She's not had any PND or orthopnea. She's not describing palpitations, presyncope or syncope. She's had no weight gain or edema.  Of note she has a difficult social situation.  She takes care of her disabled daughter.   Past Medical History  Diagnosis Date  . Other and unspecified hyperlipidemia   . Benign hypertensive heart disease without heart failure   . Adjustment disorder with mixed anxiety and depressed mood   . Other dyspnea and respiratory abnormality   . Interstitial cystitis   . Osteopenia   . CAD (coronary artery disease)     60% stenosis LAD, 2008    Past Surgical History  Procedure Laterality  Date  . Fracture surgery      Right hip  . Cholecystectomy    . Hernia repair      Right inguinal  . Abdominal hysterectomy      Cancer and endometriosis  . Cataract extraction       Current Outpatient Prescriptions  Medication Sig Dispense Refill  . aspirin EC 81 MG tablet Take 81 mg by mouth daily.    . calcium carbonate (OS-CAL) 600 MG TABS tablet Take 600 mg by mouth daily with breakfast.    . Cholecalciferol (VITAMIN D) 2000 UNITS CAPS Take 1 capsule by mouth daily.    . diphenhydrAMINE (BENADRYL) 25 MG tablet Take 12.5 mg by mouth every 6 (six) hours as needed (shaking).    . fexofenadine (ALLEGRA) 180 MG tablet Take 180 mg by mouth daily.    Marland Kitchen levothyroxine (SYNTHROID, LEVOTHROID) 50 MCG tablet Take 2 tablets (100 mcg total) by mouth daily. 180 tablet 3  . LORazepam (ATIVAN) 0.5 MG tablet TAKE 1 BY MOUTH TWICE DAILYAS NEEDED FOR ANXIETY 180 tablet 0  . meclizine (ANTIVERT) 12.5 MG tablet Take 1 tablet (12.5 mg total) by mouth 2 (two) times daily as needed for dizziness. 30 tablet 1  . Multiple Vitamins-Minerals (PRESERVISION AREDS PO) Take 1 tablet by mouth daily.    . verapamil (CALAN) 120 MG tablet 1 I/2 tab bid (Patient taking differently: 120 mg. Take 1/2 tab in AM  and Whole in the afternoon) 270 tablet 3   No current facility-administered medications for this visit.    Allergies:   Alendronate sodium; Aspirin free childs; Ciprofloxacin; Codeine; Crestor; Darvon; Flonase; Lipitor; Nitrofurantoin; Nitrofurantoin monohyd macro; Nsaids; Other; Pravachol; Prevnar 13; Propranolol hcl; Sulfa antibiotics; Sulfamethoxazole; Ultram; Welchol; Zetia; and Zocor    Social History:  The patient  reports that she has never smoked. She does not have any smokeless tobacco history on file. She reports that she does not drink alcohol or use illicit drugs.   Family History:  The patient's family history includes CAD (age of onset: 53) in her father; CAD (age of onset: 23) in her daughter; CAD  (age of onset: 13) in her mother; Diabetes type I in her daughter.    ROS:  Please see the history of present illness.   Otherwise, review of systems are positive for none.   All other systems are reviewed and negative.    PHYSICAL EXAM: VS:  BP 138/84 mmHg  Pulse 87  Ht 5\' 4"  (1.626 m)  Wt 162 lb 5 oz (73.624 kg)  BMI 27.85 kg/m2  LMP 03/21/1971 , BMI Body mass index is 27.85 kg/(m^2). GENERAL:  Well appearing HEENT:  Pupils equal round and reactive, fundi not visualized, oral mucosa unremarkable NECK:  No jugular venous distention, waveform within normal limits, carotid upstroke brisk and symmetric, no bruits, no thyromegaly LYMPHATICS:  No cervical, inguinal adenopathy LUNGS:  Clear to auscultation bilaterally BACK:  No CVA tenderness CHEST:  Unremarkable HEART:  PMI not displaced or sustained,S1 and S2 within normal limits, no S3, no S4, no clicks, no rubs, no murmurs ABD:  Flat, positive bowel sounds normal in frequency in pitch, positive abdominal bruits, no rebound, no guarding, no midline pulsatile mass, no hepatomegaly, no splenomegaly EXT:  2 plus pulses throughout, no edema, no cyanosis no clubbing SKIN:  No rashes no nodules NEURO:  Cranial nerves II through XII grossly intact, motor grossly intact throughout PSYCH:  Cognitively intact, oriented to person place and time   EKG:  EKG is ordered today. The ekg ordered today demonstrates sinus rhythm, rate 87, axis within normal limits, old anteroseptal infarct, premature atrial contractions, QTC prolonged, no acute ST-T wave changes.   Recent Labs: 08/03/2015: ALT 8 10/01/2015: TSH 1.930 10/21/2015: BUN 18; Creatinine, Ser 0.97; Hemoglobin 14.7; Platelets 206; Potassium 3.7; Sodium 139    Lipid Panel    Component Value Date/Time   CHOL 193 06/24/2015 0918   CHOL 260* 09/23/2014 1028   TRIG 162* 06/24/2015 0918   TRIG 155* 09/23/2014 1028   HDL 44 06/24/2015 0918   HDL 47 09/23/2014 1028   HDL 45 03/20/2013 0958    CHOLHDL 5.5* 09/23/2014 1028   CHOLHDL 4.4 03/20/2013 0958   VLDL 25 03/20/2013 0958   LDLCALC 182* 09/23/2014 1028   LDLCALC 163* 05/14/2014 1038   LDLCALC 130* 03/20/2013 0958      Wt Readings from Last 3 Encounters:  10/29/15 162 lb 5 oz (73.624 kg)  10/21/15 155 lb (70.308 kg)  10/01/15 159 lb (72.122 kg)      Other studies Reviewed: Additional studies/ records that were reviewed today include: Previous cath, and ED records. Review of the above records demonstrates:  Please see elsewhere in the note.     ASSESSMENT AND PLAN:  CHEST PAIN:  This is consistent with unstable angina. She'll be admitted to the hospital today. She will need cardiac catheterization. The patient understands that risks included but are not  limited to stroke (1 in 1000), death (1 in 71), kidney failure [usually temporary] (1 in 500), bleeding (1 in 200), allergic reaction [possibly serious] (1 in 200).  The patient understands and agrees to proceed.      HTN:  She has had blood pressures that are above target. This can be addressed during the course of her hospitalization.  Current medicines are reviewed at length with the patient today.  The patient does not have concerns regarding medicines.  The following changes have been made:  no change  Labs/ tests ordered today include:  Patient is admitted and cardiac cath is ordered.    Disposition:   FU with after her hospitalization.      Signed, Minus Breeding, MD  10/29/2015 11:38 AM    [facility]

## 2015-10-29 NOTE — H&P (Signed)
Expand All Collapse All           Date: 10/29/2015   ID: Emily Wagner, DOB [birth year], MRN [id number]  PCP: Redge Gainer, MD Cardiologist: Minus Breeding, MD   Chief Complaint  Patient presents with  . Chest Pain     History of Present Illness:  Emily Wagner is a 81 y.o. female who presents for evaluation of chest pain. She does have a history of nonobstructive coronary disease. She had a cardiac catheterization in 2008. She had proximal calcification in the LAD. There was 70% stenosis at the takeoff of the first diagonal and 60% stenosis in the mid vessel. She was managed medically. She returns for follow-up having had some chest discomfort. She's been very fatigued recently. A few weeks ago she went pick some turnip greens at a man's vegetable patch. Walking she was very short of breath. On the way back to the car she had sharp chest discomfort. She felt very nauseated. It took her several minutes to recover. She again had chest discomfort on Thanksgiving day actually went to the emergency room. I reviewed these records. There was no objective evidence of ischemia. She was not admitted to the hospital. She has continued to have this mild chest ache constantly but has not done anything to push herself to cause the more intense discomfort that she had recently. She's not had any PND or orthopnea. She's not describing palpitations, presyncope or syncope. She's had no weight gain or edema.  Of note she has a difficult social situation. She takes care of her disabled daughter.   Past Medical History  Diagnosis Date  . Other and unspecified hyperlipidemia   . Benign hypertensive heart disease without heart failure   . Adjustment disorder with mixed anxiety and depressed mood   . Other dyspnea and respiratory abnormality   . Interstitial cystitis   . Osteopenia   . CAD (coronary artery disease)     60% stenosis LAD, 2008     Past Surgical History  Procedure Laterality Date  . Fracture surgery      Right hip  . Cholecystectomy    . Hernia repair      Right inguinal  . Abdominal hysterectomy      Cancer and endometriosis  . Cataract extraction       Current Outpatient Prescriptions  Medication Sig Dispense Refill  . aspirin EC 81 MG tablet Take 81 mg by mouth daily.    . calcium carbonate (OS-CAL) 600 MG TABS tablet Take 600 mg by mouth daily with breakfast.    . Cholecalciferol (VITAMIN D) 2000 UNITS CAPS Take 1 capsule by mouth daily.    . diphenhydrAMINE (BENADRYL) 25 MG tablet Take 12.5 mg by mouth every 6 (six) hours as needed (shaking).    . fexofenadine (ALLEGRA) 180 MG tablet Take 180 mg by mouth daily.    Marland Kitchen levothyroxine (SYNTHROID, LEVOTHROID) 50 MCG tablet Take 2 tablets (100 mcg total) by mouth daily. 180 tablet 3  . LORazepam (ATIVAN) 0.5 MG tablet TAKE 1 BY MOUTH TWICE DAILYAS NEEDED FOR ANXIETY 180 tablet 0  . meclizine (ANTIVERT) 12.5 MG tablet Take 1 tablet (12.5 mg total) by mouth 2 (two) times daily as needed for dizziness. 30 tablet 1  . Multiple Vitamins-Minerals (PRESERVISION AREDS PO) Take 1 tablet by mouth daily.    . verapamil (CALAN) 120 MG tablet 1 I/2 tab bid (Patient taking differently: 120 mg. Take 1/2 tab in AM and Whole in the  afternoon) 270 tablet 3   No current facility-administered medications for this visit.    Allergies: Alendronate sodium; Aspirin free childs; Ciprofloxacin; Codeine; Crestor; Darvon; Flonase; Lipitor; Nitrofurantoin; Nitrofurantoin monohyd macro; Nsaids; Other; Pravachol; Prevnar 13; Propranolol hcl; Sulfa antibiotics; Sulfamethoxazole; Ultram; Welchol; Zetia; and Zocor    Social History: The patient  reports that she has never smoked. She does not have any smokeless tobacco history on file. She reports that she does not drink alcohol or use illicit  drugs.   Family History: The patient's family history includes CAD (age of onset: 12) in her father; CAD (age of onset: 75) in her daughter; CAD (age of onset: 32) in her mother; Diabetes type I in her daughter.    ROS: Please see the history of present illness. Otherwise, review of systems are positive for none. All other systems are reviewed and negative.    PHYSICAL EXAM: VS: BP 138/84 mmHg  Pulse 87  Ht 5\' 4"  (1.626 m)  Wt 162 lb 5 oz (73.624 kg)  BMI 27.85 kg/m2  LMP 03/21/1971 , BMI Body mass index is 27.85 kg/(m^2). GENERAL: Well appearing HEENT: Pupils equal round and reactive, fundi not visualized, oral mucosa unremarkable NECK: No jugular venous distention, waveform within normal limits, carotid upstroke brisk and symmetric, no bruits, no thyromegaly LYMPHATICS: No cervical, inguinal adenopathy LUNGS: Clear to auscultation bilaterally BACK: No CVA tenderness CHEST: Unremarkable HEART: PMI not displaced or sustained,S1 and S2 within normal limits, no S3, no S4, no clicks, no rubs, no murmurs ABD: Flat, positive bowel sounds normal in frequency in pitch, positive abdominal bruits, no rebound, no guarding, no midline pulsatile mass, no hepatomegaly, no splenomegaly EXT: 2 plus pulses throughout, no edema, no cyanosis no clubbing SKIN: No rashes no nodules NEURO: Cranial nerves II through XII grossly intact, motor grossly intact throughout PSYCH: Cognitively intact, oriented to person place and time   EKG: EKG is ordered today. The ekg ordered today demonstrates sinus rhythm, rate 87, axis within normal limits, old anteroseptal infarct, premature atrial contractions, QTC prolonged, no acute ST-T wave changes.   Recent Labs: 08/03/2015: ALT 8 10/01/2015: TSH 1.930 10/21/2015: BUN 18; Creatinine, Ser 0.97; Hemoglobin 14.7; Platelets 206; Potassium 3.7; Sodium 139    Lipid Panel  Labs (Brief)       Component Value Date/Time   CHOL 193 06/24/2015  0918   CHOL 260* 09/23/2014 1028   TRIG 162* 06/24/2015 0918   TRIG 155* 09/23/2014 1028   HDL 44 06/24/2015 0918   HDL 47 09/23/2014 1028   HDL 45 03/20/2013 0958   CHOLHDL 5.5* 09/23/2014 1028   CHOLHDL 4.4 03/20/2013 0958   VLDL 25 03/20/2013 0958   LDLCALC 182* 09/23/2014 1028   LDLCALC 163* 05/14/2014 1038   LDLCALC 130* 03/20/2013 0958       Wt Readings from Last 3 Encounters:  10/29/15 162 lb 5 oz (73.624 kg)  10/21/15 155 lb (70.308 kg)  10/01/15 159 lb (72.122 kg)      Other studies Reviewed: Additional studies/ records that were reviewed today include: Previous cath, and ED records. Review of the above records demonstrates: Please see elsewhere in the note.    ASSESSMENT AND PLAN:  CHEST PAIN: This is consistent with unstable angina. She'll be admitted to the hospital today. She will need cardiac catheterization. The patient understands that risks included but are not limited to stroke (1 in 1000), death (1 in 17), kidney failure [usually temporary] (1 in 500), bleeding (1 in 200), allergic reaction [possibly  serious] (1 in 200). The patient understands and agrees to proceed.    HTN: She has had blood pressures that are above target. This can be addressed during the course of her hospitalization.  Current medicines are reviewed at length with the patient today. The patient does not have concerns regarding medicines.  The following changes have been made: no change  Labs/ tests ordered today include:  Patient is admitted and cardiac cath is ordered.    Disposition: FU with after her hospitalization.    Signed, Minus Breeding, MD  10/29/2015 11:38 AM  [facility]

## 2015-10-29 NOTE — Progress Notes (Signed)
Pt told pharmacist she cannot take statins, "I have tried them all". Will DC Lipitor order.   Kerin Ransom PA-C 10/29/2015 7:10 PM

## 2015-10-30 ENCOUNTER — Encounter (HOSPITAL_COMMUNITY): Payer: Self-pay | Admitting: Thoracic Surgery (Cardiothoracic Vascular Surgery)

## 2015-10-30 ENCOUNTER — Other Ambulatory Visit (HOSPITAL_COMMUNITY): Payer: Medicare Other

## 2015-10-30 DIAGNOSIS — I25110 Atherosclerotic heart disease of native coronary artery with unstable angina pectoris: Secondary | ICD-10-CM

## 2015-10-30 DIAGNOSIS — I10 Essential (primary) hypertension: Secondary | ICD-10-CM

## 2015-10-30 DIAGNOSIS — R079 Chest pain, unspecified: Secondary | ICD-10-CM

## 2015-10-30 LAB — URINALYSIS, ROUTINE W REFLEX MICROSCOPIC
BILIRUBIN URINE: NEGATIVE
GLUCOSE, UA: NEGATIVE mg/dL
KETONES UR: NEGATIVE mg/dL
NITRITE: POSITIVE — AB
PH: 6 (ref 5.0–8.0)
Protein, ur: NEGATIVE mg/dL
SPECIFIC GRAVITY, URINE: 1.008 (ref 1.005–1.030)

## 2015-10-30 LAB — CBC
HEMATOCRIT: 41.3 % (ref 36.0–46.0)
Hemoglobin: 13.6 g/dL (ref 12.0–15.0)
MCH: 30.9 pg (ref 26.0–34.0)
MCHC: 32.9 g/dL (ref 30.0–36.0)
MCV: 93.9 fL (ref 78.0–100.0)
PLATELETS: 180 10*3/uL (ref 150–400)
RBC: 4.4 MIL/uL (ref 3.87–5.11)
RDW: 13.1 % (ref 11.5–15.5)
WBC: 5.7 10*3/uL (ref 4.0–10.5)

## 2015-10-30 LAB — HEPARIN LEVEL (UNFRACTIONATED)
Heparin Unfractionated: 0.17 IU/mL — ABNORMAL LOW (ref 0.30–0.70)
Heparin Unfractionated: 0.46 IU/mL (ref 0.30–0.70)

## 2015-10-30 LAB — URINE MICROSCOPIC-ADD ON

## 2015-10-30 LAB — TROPONIN I: Troponin I: 0.05 ng/mL — ABNORMAL HIGH (ref <0.031)

## 2015-10-30 MED ORDER — ACETAMINOPHEN 325 MG PO TABS
650.0000 mg | ORAL_TABLET | Freq: Four times a day (QID) | ORAL | Status: DC | PRN
  Administered 2015-10-30 – 2015-11-01 (×2): 650 mg via ORAL
  Filled 2015-10-30 (×2): qty 2

## 2015-10-30 NOTE — Consult Note (Addendum)
[address]       [place] [address]             [phone]          CARDIOTHORACIC SURGERY CONSULTATION REPORT  PCP is Redge Gainer, MD Referring Provider is Belva Crome, MD Primary Cardiologist is Minus Breeding, MD  Reason for consultation:  Severe coronary artery disease with unstable angina  HPI:  Patient is an 81 year old female with known history of coronary artery disease, hypertension, and hyperlipidemia who presents with symptoms consistent with unstable angina and has been referred for surgical consultation to discuss treatment options for management of coronary artery disease.  The patient has been followed by Dr. Percival Spanish for several years. Cardiac catheterization performed in 2008 revealed 60% stenosis of the left anterior descending coronary artery with 70% stenosis of the first diagonal branch. She has been managed medically. The last few months the patient has complained of worsening symptoms of exertional fatigue and intermittent chest discomfort. Her primary symptom initially was that of fatigue. She states that she would intermittently feel mild vague discomfort in her chest and sweaty. A few weeks ago she was walking in a guard and pick some turnip greens at a friend's vegetable patch when she developed severe chest discomfort and shortness of breath. It took several minutes to recover. She had a similar episode of chest discomfort on Thanksgiving day and was evaluated in the [facility] emergency department. EKG was without ischemic changes and troponins were negative. She was evaluated in the office by Dr. Percival Spanish yesterday, after which time she was directly admitted to the hospital for diagnostic cardiac catheterization. Catheterization performed by Dr. Tamala Julian demonstrates high-grade proximal stenosis of the left anterior descending coronary artery with anatomical characteristics that are felt to be relatively unfavorable for PCI and stenting.  Cardiothoracic surgical consultation was requested.  The patient has been widowed for 17 years and lives with one of her two daughters in [place].  Her daughter has multiple medical problems requiring considerable physical assistance. The patient has remained physically active and completely functionally independent all of her life. She drives a normal mobile, does her own cooking and cleaning, and tends to all the simple chores around the house. She suffered a fall last summer causing a pelvic fracture. She has been walking using a cane ever since for stability. She describes a several month history of worsening fatigue and recent onset of symptoms of chest discomfort as described previously. She denies any symptoms of nocturnal chest pain or shortness of breath. She gets fatigued easily. She denies symptoms of PND, orthopnea, or lower extremity edema. She has not had palpitations, dizzy spells, nor syncope.     Past Medical History  Diagnosis Date  . Other and unspecified hyperlipidemia   . Benign hypertensive heart disease without heart failure   . Adjustment disorder with mixed anxiety and depressed mood   . Other dyspnea and respiratory abnormality   . Interstitial cystitis   . Osteopenia   . CAD (coronary artery disease)   . Pelvic fracture ([place]) 05/30/2015    Past Surgical History  Procedure Laterality Date  . Fracture surgery      Right hip  . Cholecystectomy    . Hernia repair      Right inguinal  . Abdominal hysterectomy      Cancer and endometriosis  . Cataract extraction    . Cardiac catheterization  2008    Family History  Problem  Relation Age of Onset  . CAD Mother 49    Died suddenly  . CAD Father 81    Died suddenly  . CAD Daughter 23  . Diabetes type I Daughter     Since age 68    Social History   Social History  . Marital Status: Widowed    Spouse Name: N/A  . Number of Children: 2  . Years of Education: N/A   Occupational History  . Not on file.    Social History Main Topics  . Smoking status: Never Smoker   . Smokeless tobacco: Not on file  . Alcohol Use: No  . Drug Use: No  . Sexual Activity: Not on file   Other Topics Concern  . Not on file   Social History Narrative   Lives with daughter who has had a massive MI and stroke.     Prior to Admission medications   Medication Sig Start Date End Date Taking? Authorizing Provider  aspirin EC 81 MG tablet Take 81 mg by mouth daily.   Yes Historical Provider, MD  calcium carbonate (OS-CAL) 600 MG TABS tablet Take 600 mg by mouth daily with breakfast.   Yes Historical Provider, MD  Cholecalciferol (VITAMIN D) 2000 UNITS CAPS Take 2,000 Units by mouth daily.    Yes Historical Provider, MD  diphenhydrAMINE (BENADRYL) 25 MG tablet Take 12.5 mg by mouth daily.    Yes Historical Provider, MD  fexofenadine (ALLEGRA) 180 MG tablet Take 180 mg by mouth daily.   Yes Historical Provider, MD  levothyroxine (SYNTHROID, LEVOTHROID) 50 MCG tablet Take 2 tablets (100 mcg total) by mouth daily. 09/10/15  Yes Chipper Herb, MD  LORazepam (ATIVAN) 0.5 MG tablet TAKE 1 BY MOUTH TWICE DAILYAS NEEDED FOR ANXIETY Patient taking differently: TAKE 1 BY MOUTH EVERY NIGHT FOR SLEEP 10/12/15  Yes Chipper Herb, MD  Multiple Vitamins-Minerals (PRESERVISION AREDS PO) Take 1 tablet by mouth daily.   Yes Historical Provider, MD  verapamil (CALAN) 120 MG tablet 1 I/2 tab bid Patient taking differently: Take 120 mg by mouth 2 (two) times daily.  02/10/15  Yes Chipper Herb, MD    Current Facility-Administered Medications  Medication Dose Route Frequency Provider Last Rate Last Dose  . 0.9 %  sodium chloride infusion  250 mL Intravenous PRN Belva Crome, MD      . aspirin EC tablet 81 mg  81 mg Oral Daily Erlene Quan, PA-C   81 mg at 10/30/15 [id number]  . calcium carbonate (OS-CAL - dosed in mg of elemental calcium) tablet 500 mg of elemental calcium  1 tablet Oral Q breakfast Doreene Burke Kilroy, PA-C   500 mg of  elemental calcium at 10/30/15 0927  . diphenhydrAMINE (BENADRYL) tablet 12.5 mg  12.5 mg Oral Q6H PRN Erlene Quan, PA-C      . heparin ADULT infusion 100 units/mL (25000 units/250 mL)  1,050 Units/hr Intravenous Continuous Minus Breeding, MD 10.5 mL/hr at 10/30/15 1239 1,050 Units/hr at 10/30/15 1239  . levothyroxine (SYNTHROID, LEVOTHROID) tablet 100 mcg  100 mcg Oral QAC breakfast Erlene Quan, PA-C   100 mcg at 10/30/15 [id number]  . loratadine (CLARITIN) tablet 10 mg  10 mg Oral Daily Erlene Quan, PA-C   10 mg at 10/30/15 [id number]  . LORazepam (ATIVAN) tablet 0.5 mg  0.5 mg Oral Q6H PRN Erlene Quan, PA-C   0.5 mg at 10/29/15 2137  . nitroGLYCERIN (NITROGLYN) 2 % ointment 0.5 inch  0.5  inch Topical 4 times per day Erlene Quan, PA-C   0.5 inch at 10/30/15 1239  . nitroGLYCERIN (NITROSTAT) SL tablet 0.4 mg  0.4 mg Sublingual Q5 Min x 3 PRN Erlene Quan, PA-C      . ondansetron [facility]) injection 4 mg  4 mg Intravenous Q6H PRN Doreene Burke Kilroy, PA-C      . sodium chloride 0.9 % injection 3 mL  3 mL Intravenous Q12H Belva Crome, MD   3 mL at 10/30/15 0928  . sodium chloride 0.9 % injection 3 mL  3 mL Intravenous PRN Belva Crome, MD      . verapamil (CALAN) tablet 120 mg  120 mg Oral BID Doreene Burke Kilroy, PA-C   120 mg at 10/30/15 [id number]    Allergies  Allergen Reactions  . Nitrofurantoin Other (See Comments)    Liver failure  . Nsaids Other (See Comments)    Kidney failure  . Other Other (See Comments)    Most antibiotics require supervision per patient  . Flonase [Fluticasone Propionate] Other (See Comments)    headache  . Prevnar 13 [Pneumococcal 13-Val Conj Vacc] Other (See Comments)    Nerve damage in left arm  . Alendronate Sodium Other (See Comments)  . Ciprofloxacin Other (See Comments)    Patient denies allergy--"I take Cipro for bladder infections"  . Codeine Other (See Comments)  . Crestor [Rosuvastatin Calcium] Other (See Comments)  . Darvon Other (See Comments)  . Lipitor  [Atorvastatin Calcium] Other (See Comments)  . Pravachol Other (See Comments)  . Propranolol Hcl Other (See Comments)    Unknown  . Sulfa Antibiotics Other (See Comments)  . Sulfamethoxazole Other (See Comments)    Unknown  . Ultram [Tramadol Hcl] Itching  . Welchol [Colesevelam Hcl] Other (See Comments)  . Zetia [Ezetimibe] Other (See Comments)  . Zocor [Simvastatin] Other (See Comments)      Review of Systems:   General:  normal appetite, decreased energy, no weight gain, no weight loss, no fever  Cardiac:  + chest pain with exertion, no chest pain at rest, + SOB with exertion, no resting SOB, no PND, no orthopnea, no palpitations, no arrhythmia, no atrial fibrillation, no LE edema, no dizzy spells, no syncope  Respiratory:  + shortness of breath, no home oxygen, no productive cough, no dry cough, no bronchitis, no wheezing, no hemoptysis, no asthma, no pain with inspiration or cough, no sleep apnea, no CPAP at night  GI:   no difficulty swallowing, no reflux, no frequent heartburn, no hiatal hernia, no abdominal pain, no constipation, no diarrhea, no hematochezia, no hematemesis, no melena  GU:   no dysuria,  no frequency, no urinary tract infection, no hematuria, no kidney stones, + kidney disease in the past related to NSAID use  Vascular:  noain suggestive of claudication, noain in feet, noeg cramps, noaricose veins, noVT, noon-healing foot ulcer  Neuro:   No stroke, no TIA's, no seizures, no headaches, no temporary blindness one eye,  no slurred speech, no peripheral neuropathy, no chronic pain, mild instability of gait, no memory/cognitive dysfunction  Musculoskeletal: mild arthritis, no joint swelling, no myalgias, minor difficulty walking, slightly decreased mobility - walks using a cane  Skin:   no rash, no itching, no skin infections, no pressure sores or ulcerations  Psych:   + anxiety, no depression, + nervousness, no unusual recent stress  Eyes:   no blurry vision, no  floaters, no recent vision changes, + wears glasses or contacts  ENT:   no hearing loss, no loose or painful teeth, no dentures, last saw dentist within the past year  Hematologic:  no easy bruising, no abnormal bleeding, no clotting disorder, no frequent epistaxis  Endocrine:  no diabetes, does not check CBG's at home     Physical Exam:   BP 153/80 mmHg  Pulse 73  Temp(Src) 98.2 F (36.8 C) (Oral)  Resp 18  Ht 5\' 4"  (1.626 m)  Wt 72.2 kg (159 lb 2.8 oz)  BMI 27.31 kg/m2  SpO2 93%  LMP 03/21/1971  General:  Elderly but o/w  well-appearing  HEENT:  Unremarkable   Neck:   no JVD, no bruits, no adenopathy   Chest:   clear to auscultation, symmetrical breath sounds, no wheezes, no rhonchi   CV:   RRR, no murmur   Abdomen:  soft, non-tender, no masses   Extremities:  warm, well-perfused, pulses diminished, no lower extremity edema  Rectal/GU  Deferred  Neuro:   Grossly non-focal and symmetrical throughout  Skin:   Clean and dry, no rashes, no breakdown   Diagnostic Tests:  CARDIAC CATHETERIZATION  Procedures    Left Heart Cath and Coronary Angiography    Conclusion    1. Mid RCA to Dist RCA lesion, 60% stenosed. 2. Ost LAD to Prox LAD lesion, 75% stenosed. 3. Prox LAD to Mid LAD lesion, 90% stenosed. 4. 2nd Diag lesion, 75% stenosed. 5. Ost 1st Diag-2 lesion, 75% stenosed. 6. Ost 1st Diag-1 lesion, 75% stenosed.   Ostial to proximal ulcerated, heavily calcified, and tortuous 90+ percent stenosis representing significant progression over the past 8 years. There are also 3 moderate to large diagonal branches that arise from the segmental LAD stenosis. Each diagonal contains ostial narrowing.  Widely patent circumflex and 60% mid right coronary.   Severe three-vessel coronary tortuosity.  Overall normal left ventricular systolic function.   RECOMMENDATIONS:   I have counseled the patient to have surgical consultation and heart team approach. PCI would have a  higher than usual ischemic and technical risk of complications. Off pump LIMA to LAD and possibly diagonal grafting would probably be safer and more durable.  Final decision about will depend become clearer after discussion with heart team colleagues.  IV heparin  IV nitroglycerin  Much remain hospitalized until treatment achieved.     Indications    Unstable angina pectoris (HCC) [I20.0 (ICD-10-CM)]    Technique and Indications    The right radial area was sterilely prepped and draped. Intravenous sedation with Versed and fentanyl was administered. 1% Xylocaine was infiltrated to achieve local analgesia. A double wall stick with an angiocath was utilized to obtain intra-arterial access. The modified Seldinger technique was used to place a 41F " Slender" sheath in the right radial artery. Coronary angiography was done using 5 F catheters. Right coronary angiography was performed with a JR4. Left ventriculography was done using the JR 4 catheter and hand injection. Left coronary angiography was performed with a JL 3.5 cm.  Digital images were reviewed. The LAD is obviously the culprit for the patient's presentation. Because of the complexity of the LAD and side branch involvement no further treatment was felt indicated. Heart team approach will ensue.  The right radial access site achieved hemostasis with a pneumatic band.  Estimated blood loss <50 mL. There were no immediate complications during the procedure.    Coronary Findings    Dominance: Right   Left Anterior Descending   . Ost LAD to Prox LAD lesion, 75%  stenosed. The lesion is type C Eccentric.   Marland Kitchen Prox LAD to Mid LAD lesion, 90% stenosed. The lesion is type C Calcified diffuse eccentric ulcerative located at the bend.   . First Diagonal Branch   . Ost 1st Diag-1 lesion, 75% stenosed.   Colon Flattery 1st Diag-2 lesion, 75% stenosed. Eccentric.   Marland Kitchen Second Diagonal Branch   The vessel is small in size.   . 2nd Diag lesion, 75%  stenosed. The lesion is type C.     Left Circumflex  . Vessel is moderate in size.   Marland Kitchen Second Obtuse Marginal Branch   The vessel is small in size.     Right Coronary Artery   . Mid RCA to Dist RCA lesion, 60% stenosed. The lesion is type C Eccentric.   . Right Posterior Descending Artery   The vessel is small in size.   . First Right Posterolateral   The vessel is small in size.   Marland Kitchen Second Right Posterolateral   The vessel is small in size.   . Third Right Posterolateral   The vessel is small in size.      Wall Motion                 Left Heart    Left Ventricle The left ventricular ejection fraction is 55-65% by visual estimate.    Coronary Diagrams    Diagnostic Diagram            Implants    Name ID Temporary Type Supply   No information to display    PACS Images    Show images for Cardiac catheterization     Link to Procedure Log    Procedure Log      Hemo Data       Most Recent Value   AO Systolic Pressure  [id number] mmHg   AO Diastolic Pressure  79 mmHg   AO Mean  123456 mmHg   LV Systolic Pressure  0000000 mmHg   LV Diastolic Pressure  2 mmHg   LV EDP  12 mmHg   Arterial Occlusion Pressure Extended Systolic Pressure  123456 mmHg   Arterial Occlusion Pressure Extended Diastolic Pressure  80 mmHg   Arterial Occlusion Pressure Extended Mean Pressure  114 mmHg   Left Ventricular Apex Extended Systolic Pressure  99991111 mmHg   Left Ventricular Apex Extended Diastolic Pressure  10 mmHg   Left Ventricular Apex Extended EDP Pressure  14 mmHg        Impression:  Patient has severe multivessel coronary artery disease and presents with symptoms consistent with unstable angina. I have personally reviewed the diagnostic cardiac catheterization she had performed yesterday. There is high-grade long-segment proximal stenosis of the left anterior descending coronary artery.  There are areas of dense calcification throughout this vessel and the  diseased segment involves takeoff of a small to medium size first diagonal branch. Coronary anatomy would be relatively unfavorable for PCI and stenting. There is also moderate, nonobstructive disease in the mid right coronary artery. Left ventricular systolic function remains normal.  Although PCI and stenting might be feasible, it would clearly be associated with somewhat increased risk.  I agree that the patient would probably best be treated with surgical revascularization.    Plan:  I have reviewed the indications, risks, and potential benefits of coronary artery bypass grafting with the patient and one of her daughters this afternoon.  Alternative treatment strategies have been discussed, including the relative risks, benefits and long  term prognosis associated with medical therapy, percutaneous coronary intervention, and surgical revascularization.  The patient understands and accepts all potential associated risks of surgery including but not limited to risk of death, stroke or other neurologic complication, myocardial infarction, congestive heart failure, respiratory failure, renal failure, bleeding requiring blood transfusion and/or reexploration, aortic dissection or other major vascular complication, arrhythmia, heart block or bradycardia requiring permanent pacemaker, pneumonia, pleural effusion, wound infection, pulmonary embolus or other thromboembolic complication, chronic pain or other delayed complications related to median sternotomy, or the late recurrence of symptomatic ischemic heart disease and/or congestive heart failure.  The importance of long term risk modification have been emphasized.  All questions answered.  We plan to proceed with surgery on Tuesday, 11/02/2015.   I spent in excess of 120 minutes during the conduct of this hospital consultation and >50% of this time involved direct face-to-face encounter for counseling and/or coordination of the patient's  care.    Valentina Gu. Roxy Manns, MD 10/30/2015 4:17 PM

## 2015-10-30 NOTE — Progress Notes (Signed)
PHARMACY NOTE - Follow Up Consult  Pharmacy Consult  :  Heparin Indication  :  Heparin bridging for possible CABG  Heparin Dosing Weight: 69 kg  Recent Labs  10/29/15 1355 10/30/15 0137 10/30/15 0929  HGB 14.2 13.6  --   HCT 43.6 41.3  --   PLT 192 180  --   LABPROT 14.0  --   --   INR 1.06  --   --   HEPARINUNFRC  --   --  0.17*  CREATININE 0.95  --   --    Medications: Infusions:  . heparin 850 Units/hr (10/30/15 0054)   Assessment:  81 y/o female s/p Cath found to have severe 3v CAD.  CVTS to consult for possible CABG.  Heparin started as a bridge pending possible surgery.  Goal:   Heparin level 0.3-0.7 units/ml   Plan: 1.  Increase Heparin infusion to 1050 units/hr 2.  Next Heparin level in 8 hours 3.  Daily heparin level and CBC.  Monitor for bleeding complications.   Martin Belling, Craig Guess,  Pharm.D  10/30/2015, 11:16 AM

## 2015-10-30 NOTE — Progress Notes (Signed)
SUBJECTIVE: The patient is doing well today.  At this time, she denies chest pain, shortness of breath, or any new concerns.  Marland Kitchen aspirin EC  81 mg Oral Daily  . calcium carbonate  1 tablet Oral Q breakfast  . levothyroxine  100 mcg Oral QAC breakfast  . loratadine  10 mg Oral Daily  . nitroGLYCERIN  0.5 inch Topical 4 times per day  . sodium chloride  3 mL Intravenous Q12H  . verapamil  120 mg Oral BID   . heparin 850 Units/hr (10/30/15 0054)    OBJECTIVE: Physical Exam: Filed Vitals:   10/29/15 1900 10/29/15 2000 10/29/15 2216 10/30/15 0506  BP: 143/80 163/92 153/73 157/95  Pulse: 62  77 68  Temp:   97.9 F (36.6 C) 97.5 F (36.4 C)  TempSrc:   Oral Oral  Resp: 18  18 18   Height:      Weight:      SpO2: 96%  95% 93%    Intake/Output Summary (Last 24 hours) at 10/30/15 1105 Last data filed at 10/30/15 0900  Gross per 24 hour  Intake    840 ml  Output    400 ml  Net    440 ml    Telemetry reveals sinus rhythm  GEN- The patient is well appearing, alert and oriented x 3 today.   Head- normocephalic, atraumatic Eyes-  Sclera clear, conjunctiva pink Ears- hearing intact Oropharynx- clear Neck- supple,   Lungs- Clear to ausculation bilaterally, normal work of breathing Heart- Regular rate and rhythm, no murmurs, rubs or gallops, PMI not laterally displaced GI- soft, NT, ND, + BS Extremities- no clubbing, cyanosis, or edema Skin- no rash or lesion Psych- euthymic mood, full affect Neuro- strength and sensation are intact  LABS: Basic Metabolic Panel:  Recent Labs  10/29/15 1355  NA 142  K 3.9  CL 108  CO2 26  GLUCOSE 83  BUN 20  CREATININE 0.95  CALCIUM 9.0   Liver Function Tests: No results for input(s): AST, ALT, ALKPHOS, BILITOT, PROT, ALBUMIN in the last 72 hours. No results for input(s): LIPASE, AMYLASE in the last 72 hours. CBC:  Recent Labs  10/29/15 1355 10/30/15 0137  WBC 5.7 5.7  NEUTROABS 3.7  --   HGB 14.2 13.6  HCT 43.6 41.3    MCV 94.6 93.9  PLT 192 180   Cardiac Enzymes:  Recent Labs  10/29/15 1355 10/29/15 2000 10/30/15 0036  TROPONINI <0.03 <0.03 0.05*   Cath 10/29/15 1. Mid RCA to Dist RCA lesion, 60% stenosed. 2. Ost LAD to Prox LAD lesion, 75% stenosed. 3. Prox LAD to Mid LAD lesion, 90% stenosed. 4. 2nd Diag lesion, 75% stenosed. 5. Ost 1st Diag-2 lesion, 75% stenosed. 6. Ost 1st Diag-1 lesion, 75% stenosed.   Ostial to proximal ulcerated, heavily calcified, and tortuous 90+ percent stenosis representing significant progression over the past 8 years. There are also 3 moderate to large diagonal branches that arise from the segmental LAD stenosis. Each diagonal contains ostial narrowing.  Widely patent circumflex and 60% mid right coronary.   Severe three-vessel coronary tortuosity.  Overall normal left ventricular systolic function.  ASSESSMENT AND PLAN:  Active Problems:   Chest pain with moderate risk of acute coronary syndrome   CAD (coronary artery disease)   1. Unstable angina/ CAD Cath reviewed with Dr Tamala Julian this am.  He says that he has requested CV surgery consultation for possible LAD bypass.   Pt states "I hope you decide on a stent"...  She seems quite uneasy with being in the hospital. I will ask CV surgery to see when able. Continue current medicine for now Echo ordered, though EF preserved by LHC She declines statin  2. HTN Stable Not sure why she is on verapamil Would consider switching to metoprolol if she will allow Awaiting echo No change required today    Thompson Grayer, MD 10/30/2015 11:05 AM

## 2015-10-30 NOTE — Progress Notes (Signed)
PHARMACY NOTE - Follow Up Consult  Pharmacy Consult  :  Heparin Indication  :  Heparin bridging for possible CABG  Heparin Dosing Weight: 69 kg  Recent Labs  10/29/15 1355 10/30/15 0137 10/30/15 0929 10/30/15 1954  HGB 14.2 13.6  --   --   HCT 43.6 41.3  --   --   PLT 192 180  --   --   LABPROT 14.0  --   --   --   INR 1.06  --   --   --   HEPARINUNFRC  --   --  0.17* 0.46  CREATININE 0.95  --   --   --    Medications: Infusions:  . heparin 1,050 Units/hr (10/30/15 1239)   Assessment:  81 y/o female s/p Cath found to have severe 3v CAD.  CVTS to consult for possible CABG.  Heparin started as a bridge pending possible surgery.  Heparin therapeutic at 0.46 on 1050 units/hr  Goal:   Heparin level 0.3-0.7 units/ml   Plan: 1.  Continue Heparin infusion at 1050 units/hr 2. Daily heparin level and CBC.   3. Monitor for bleeding complications.   Levester Fresh, PharmD, BCPS, [facility] Clinical Pharmacist Pager [phone] 10/30/2015 8:35 PM

## 2015-10-31 ENCOUNTER — Inpatient Hospital Stay (HOSPITAL_COMMUNITY): Payer: Medicare Other

## 2015-10-31 ENCOUNTER — Encounter (HOSPITAL_COMMUNITY): Payer: Self-pay | Admitting: Thoracic Surgery (Cardiothoracic Vascular Surgery)

## 2015-10-31 DIAGNOSIS — N390 Urinary tract infection, site not specified: Secondary | ICD-10-CM

## 2015-10-31 DIAGNOSIS — I200 Unstable angina: Secondary | ICD-10-CM

## 2015-10-31 HISTORY — DX: Urinary tract infection, site not specified: N39.0

## 2015-10-31 LAB — BLOOD GAS, ARTERIAL
ACID-BASE DEFICIT: 2.1 mmol/L — AB (ref 0.0–2.0)
BICARBONATE: 21.6 meq/L (ref 20.0–24.0)
Drawn by: 44166
FIO2: 0.21
O2 SAT: 97 %
PATIENT TEMPERATURE: 97.7
PCO2 ART: 32.3 mmHg — AB (ref 35.0–45.0)
PO2 ART: 83.5 mmHg (ref 80.0–100.0)
TCO2: 22.6 mmol/L (ref 0–100)
pH, Arterial: 7.437 (ref 7.350–7.450)

## 2015-10-31 LAB — CBC
HEMATOCRIT: 42 % (ref 36.0–46.0)
Hemoglobin: 13.9 g/dL (ref 12.0–15.0)
MCH: 31.2 pg (ref 26.0–34.0)
MCHC: 33.1 g/dL (ref 30.0–36.0)
MCV: 94.4 fL (ref 78.0–100.0)
PLATELETS: 190 10*3/uL (ref 150–400)
RBC: 4.45 MIL/uL (ref 3.87–5.11)
RDW: 13 % (ref 11.5–15.5)
WBC: 6.8 10*3/uL (ref 4.0–10.5)

## 2015-10-31 LAB — APTT: APTT: 98 s — AB (ref 24–37)

## 2015-10-31 LAB — COMPREHENSIVE METABOLIC PANEL
ALBUMIN: 3.2 g/dL — AB (ref 3.5–5.0)
ALT: 10 U/L — ABNORMAL LOW (ref 14–54)
AST: 19 U/L (ref 15–41)
Alkaline Phosphatase: 93 U/L (ref 38–126)
Anion gap: 9 (ref 5–15)
BILIRUBIN TOTAL: 1 mg/dL (ref 0.3–1.2)
BUN: 15 mg/dL (ref 6–20)
CHLORIDE: 106 mmol/L (ref 101–111)
CO2: 23 mmol/L (ref 22–32)
Calcium: 9.1 mg/dL (ref 8.9–10.3)
Creatinine, Ser: 0.85 mg/dL (ref 0.44–1.00)
GFR calc Af Amer: >60 mL/min (ref >60)
GFR calc non Af Amer: >60 mL/min (ref >60)
GLUCOSE: 92 mg/dL (ref 65–99)
POTASSIUM: 3.5 mmol/L (ref 3.5–5.1)
Sodium: 138 mmol/L (ref 135–145)
Total Protein: 6.6 g/dL (ref 6.5–8.1)

## 2015-10-31 LAB — LIPID PANEL
Cholesterol: 212 mg/dL — ABNORMAL HIGH (ref 0–200)
HDL: 39 mg/dL — ABNORMAL LOW (ref >40)
LDL CALC: 153 mg/dL — AB (ref 0–99)
TRIGLYCERIDES: 99 mg/dL (ref <150)
Total CHOL/HDL Ratio: 5.4 RATIO
VLDL: 20 mg/dL (ref 0–40)

## 2015-10-31 LAB — PREALBUMIN: Prealbumin: 15.1 mg/dL — ABNORMAL LOW (ref 18–38)

## 2015-10-31 LAB — PROTIME-INR
INR: 1.11 (ref 0.00–1.49)
Prothrombin Time: 14.5 seconds (ref 11.6–15.2)

## 2015-10-31 LAB — HEPARIN LEVEL (UNFRACTIONATED): HEPARIN UNFRACTIONATED: 0.44 [IU]/mL (ref 0.30–0.70)

## 2015-10-31 MED ORDER — CEFTAZIDIME 1 G IJ SOLR
1.0000 g | Freq: Two times a day (BID) | INTRAMUSCULAR | Status: DC
  Administered 2015-10-31 – 2015-11-01 (×3): 1 g via INTRAVENOUS
  Filled 2015-10-31 (×5): qty 1

## 2015-10-31 NOTE — Progress Notes (Signed)
*  PRELIMINARY RESULTS* Echocardiogram 2D Echocardiogram has been performed.  Leavy Cella 10/31/2015, 9:21 AM

## 2015-10-31 NOTE — Progress Notes (Signed)
Utilization Review Completed.Emily Wagner T12/02/2015  

## 2015-10-31 NOTE — Progress Notes (Signed)
ANTIBIOTIC CONSULT NOTE - initial  Pharmacy Consult for Ceftazidime Indication: UTI  Allergies  Allergen Reactions  . Nitrofurantoin Other (See Comments)    Liver failure  . Nsaids Other (See Comments)    Kidney failure  . Other Other (See Comments)    Most antibiotics require supervision per patient  . Flonase [Fluticasone Propionate] Other (See Comments)    headache  . Prevnar 13 [Pneumococcal 13-Val Conj Vacc] Other (See Comments)    Nerve damage in left arm  . Alendronate Sodium Other (See Comments)  . Ciprofloxacin Other (See Comments)    Patient denies allergy--"I take Cipro for bladder infections"  . Codeine Other (See Comments)  . Crestor [Rosuvastatin Calcium] Other (See Comments)  . Darvon Other (See Comments)  . Lipitor [Atorvastatin Calcium] Other (See Comments)  . Pravachol Other (See Comments)  . Propranolol Hcl Other (See Comments)    Unknown  . Sulfa Antibiotics Other (See Comments)  . Sulfamethoxazole Other (See Comments)    Unknown  . Ultram [Tramadol Hcl] Itching  . Welchol [Colesevelam Hcl] Other (See Comments)  . Zetia [Ezetimibe] Other (See Comments)  . Zocor [Simvastatin] Other (See Comments)    Patient Measurements: Height: 5\' 4"  (162.6 cm) Weight: 159 lb 2.8 oz (72.2 kg) IBW/kg (Calculated) : 54.7  Vital Signs: Temp: 98.3 F (36.8 C) (12/04 1946) Temp Source: Oral (12/04 1946) BP: 156/79 mmHg (12/04 1946) Pulse Rate: 76 (12/04 1946) Intake/Output from previous day: 12/03 0701 - 12/04 0700 In: 856.1 [P.O.:700; I.V.:156.1] Out: 500 [Urine:500]  Labs:  Recent Labs  10/29/15 1355 10/30/15 0137 10/31/15 0215  WBC 5.7 5.7 6.8  HGB 14.2 13.6 13.9  PLT 192 180 190  CREATININE 0.95  --  0.85   Estimated Creatinine Clearance: 51.4 mL/min (by C-G formula based on Cr of 0.85).   Assessment:   To begin Ceftazidime for UTI.  Culture being sent. Hx recurrent UTIs and chronic interstitial cystitis.  Multiple antibiotic allergies.   Goal of  Therapy:  appropriate Ceftazidime dose for renal function and infection  Plan:   Ceftazidime 1 gram IV q12hrs.  Will follow renal function and culture data.  Arty Baumgartner, [place] Pager: [phone] 10/31/2015,9:36 PM

## 2015-10-31 NOTE — Progress Notes (Signed)
PHARMACY NOTE - Follow Up Consult  Pharmacy Consult  :  Heparin Indication  :  Heparin bridging to CABG  Heparin Dosing Weight: 69 kg   Recent Labs  10/29/15 1355 10/30/15 0137 10/30/15 0929 10/30/15 1954 10/31/15 0215  HGB 14.2 13.6  --   --  13.9  HCT 43.6 41.3  --   --  42.0  PLT 192 180  --   --  190  APTT  --   --   --   --  98*  LABPROT 14.0  --   --   --  14.5  INR 1.06  --   --   --  1.11  HEPARINUNFRC  --   --  0.17* 0.46 0.44  CREATININE 0.95  --   --   --  0.85   Medications: Scheduled:  . aspirin EC  81 mg Oral Daily  . calcium carbonate  1 tablet Oral Q breakfast  . levothyroxine  100 mcg Oral QAC breakfast  . loratadine  10 mg Oral Daily  . nitroGLYCERIN  0.5 inch Topical 4 times per day  . sodium chloride  3 mL Intravenous Q12H  . verapamil  120 mg Oral BID   Infusions:  . heparin 1,050 Units/hr (10/30/15 2259)   Assessment:  81 y/o female with severe 3v CAD on Heparin bridging until planned CABG on Tuesday.  Heparin infusing at 1050 units/hr, Heparin level within therapeutic range, 0.44 units/ml No evidence of bleeding complications noted   Goal:  Heparin level 0.3-0.7 units/ml   Plan: 1. Continue Heparin at same rate, 1050 units/hr. 2. Daily Heparin Levels, Platelet counts, CBC.  Monitor for bleeding complications  3. Follow up scheduled CABG on Tuesday.   Daran Favaro, Craig Guess,  Pharm.D  10/31/2015, 11:08 AM

## 2015-10-31 NOTE — Progress Notes (Signed)
Patient Name: Emily Wagner Date of Encounter: 10/31/2015    SUBJECTIVE: The patient met with T[facility], Dr. Roxy Manns. I also met with the patient later. We discussed the pros and cons of off-pump mammary versus PCI with increased complexity. After speaking with Dr. Roxy Manns and the patient, the decision has been made to proceed with LIMA to LAD off pump on Tuesday morning.  She has had no recurrent chest discomfort.  TELEMETRY:  Normal sinus rhythm on EKG. Filed Vitals:   10/30/15 0506 10/30/15 1357 10/30/15 2006 10/31/15 0442  BP: 157/95 153/80 146/93 146/78  Pulse: 68 73 76 73  Temp: 97.5 F (36.4 C) 98.2 F (36.8 C) 98.1 F (36.7 C) 97.7 F (36.5 C)  TempSrc: Oral Oral Oral Oral  Resp: $Remo'18 18 18 18  'YneqS$ Height:      Weight:      SpO2: 93% 93% 96% 95%    Intake/Output Summary (Last 24 hours) at 10/31/15 0644 Last data filed at 10/31/15 4982  Gross per 24 hour  Intake 856.05 ml  Output    500 ml  Net 356.05 ml   LABS: Basic Metabolic Panel:  Recent Labs  10/29/15 1355 10/31/15 0215  NA 142 138  K 3.9 3.5  CL 108 106  CO2 26 23  GLUCOSE 83 92  BUN 20 15  CREATININE 0.95 0.85  CALCIUM 9.0 9.1   CBC:  Recent Labs  10/29/15 1355 10/30/15 0137 10/31/15 0215  WBC 5.7 5.7 6.8  NEUTROABS 3.7  --   --   HGB 14.2 13.6 13.9  HCT 43.6 41.3 42.0  MCV 94.6 93.9 94.4  PLT 192 180 190   Cardiac Enzymes:  Recent Labs  10/29/15 1355 10/29/15 2000 10/30/15 0036  TROPONINI <0.03 <0.03 0.05*   BNP: Invalid input(s): POCBNP Hemoglobin A1C: No results for input(s): HGBA1C in the last 72 hours. Fasting Lipid Panel:  Recent Labs  10/31/15 0215  CHOL 212*  HDL 39*  LDLCALC 153*  TRIG 99  CHOLHDL 5.4    Radiology/Studies:  No new data is available  Physical Exam: Blood pressure 146/78, pulse 73, temperature 97.7 F (36.5 C), temperature source Oral, resp. rate 18, height $RemoveBe'5\' 4"'vjhXyrOYW$  (1.626 m), weight 159 lb 2.8 oz (72.2 kg), last menstrual period 03/21/1971,  SpO2 95 %. Weight change:   Wt Readings from Last 3 Encounters:  10/29/15 159 lb 2.8 oz (72.2 kg)  10/29/15 162 lb 5 oz (73.624 kg)  10/21/15 155 lb (70.308 kg)    The chest is clear The right radial cath site is unremarkable Extremities are without evidence of edema Cardiac exam reveals no rub  ASSESSMENT:  1. Class III-IV angina with high-grade ostial to mid LAD. Despite the patient's age of 67, her absence of other significant comorbidities makes off-pump LIMA to LAD very attractive. This treatment option has been except above the patient and after speaking with Dr. Roxy Manns, I feel this is the most durable and best approach. 2. Hypertension, controlled  Plan:  1. PA and lateral chest x-ray 2. Continue IV heparin 3. CABG, off-pump LIMA to LAD 11/02/2015, Dr. Roxy Manns.  Demetrios Isaacs 10/31/2015, 6:44 AM

## 2015-10-31 NOTE — Progress Notes (Signed)
abg collected  

## 2015-10-31 NOTE — Progress Notes (Signed)
[address]       [place] [address]             [phone]     CARDIOTHORACIC SURGERY PROGRESS NOTE  2 Days Post-Op  S/P Procedure(s) (LRB): Left Heart Cath and Coronary Angiography (N/A)  Subjective: No chest pain or SOB  Objective: Vital signs in last 24 hours: Temp:  [97.7 F (36.5 C)-98.3 F (36.8 C)] 98.3 F (36.8 C) (12/04 1946) Pulse Rate:  [73-76] 76 (12/04 1946) Cardiac Rhythm:  [-] Normal sinus rhythm (12/04 1943) Resp:  [18-19] 18 (12/04 1946) BP: (134-156)/(72-79) 156/79 mmHg (12/04 1946) SpO2:  [94 %-95 %] 94 % (12/04 1946)  Physical Exam:  Rhythm:   sinus  Breath sounds: clear  Heart sounds:  RRR  Incisions:  n/a  Abdomen:  soft  Extremities:  warm   Intake/Output from previous day: 12/03 0701 - 12/04 0700 In: 856.1 [P.O.:700; I.V.:156.1] Out: 500 [Urine:500] Intake/Output this shift:    Lab Results:  Recent Labs  10/30/15 0137 10/31/15 0215  WBC 5.7 6.8  HGB 13.6 13.9  HCT 41.3 42.0  PLT 180 190   BMET:  Recent Labs  10/29/15 1355 10/31/15 0215  NA 142 138  K 3.9 3.5  CL 108 106  CO2 26 23  GLUCOSE 83 92  BUN 20 15  CREATININE 0.95 0.85  CALCIUM 9.0 9.1    CBG (last 3)  No results for input(s): GLUCAP in the last 72 hours. PT/INR:   Recent Labs  10/31/15 0215  LABPROT 14.5  INR 1.11    CXR:  CHEST 2 VIEW  COMPARISON: Chest x-ray dated 10/21/2015.  FINDINGS: Heart size is upper normal, unchanged. Overall cardiomediastinal silhouette is stable in size and configuration. Again noted are atherosclerotic changes of the thoracic aorta with associated age-related aortic ectasia.  Probable mild scarring/atelectasis at each lung base, unchanged. Lungs otherwise clear. No pleural effusion seen. No pneumothorax seen.  Mild degenerative change noted within the thoracic spine. Surgical clips in the right upper quadrant likely related to previous cholecystectomy. Osseous and soft tissue  structures about the chest are otherwise unremarkable.  IMPRESSION: Stable chest x-ray. No evidence of acute cardiopulmonary abnormality.   Electronically Signed  By: Franki Cabot M.D.  On: 10/31/2015 08:56   Transthoracic Echocardiography  Patient:  Emily Wagner, Emily Wagner MR #:    [id number] Study Date: 10/31/2015 Gender:   F Age:    8 Height:   162.6 cm Weight:   72.1 kg BSA:    1.82 m^2 Pt. Status: Room:    2W28C  ADMITTING  Minus Breeding, MD [place], MD [place], MD [place], MD SONOGRAPHER Leavy Cella PERFORMING  [facility], Inpatient  cc:  ------------------------------------------------------------------- LV EF: 60% -  65%  ------------------------------------------------------------------- Indications:   Chest pain 786.51.  ------------------------------------------------------------------- History:  PMH:  Coronary artery disease. Risk factors: Hypertension. Dyslipidemia.  ------------------------------------------------------------------- Study Conclusions  - Left ventricle: The cavity size was normal. Wall thickness was increased in a pattern of mild LVH. Systolic function was normal. The estimated ejection fraction was in the range of 60% to 65%. Wall motion was normal; there were no regional wall motion abnormalities. Doppler parameters are consistent with abnormal left ventricular relaxation (grade 1 diastolic dysfunction). - Aortic valve: Trileaflet; normal thickness, mildly calcified leaflets.  Transthoracic echocardiography. M-mode, complete 2D, spectral Doppler, and color Doppler. Birthdate: Patient birthdate: 1933-08-22. Age: Patient is 81 yr old. Sex:  Gender: female. BMI: 27.3 kg/m^2. Blood pressure:   146/78 Patient status: Inpatient. Study date: Study date: 10/31/2015. Study time: 08:55 AM. Location:  Bedside.  -------------------------------------------------------------------  ------------------------------------------------------------------- Left ventricle: The cavity size was normal. Wall thickness was increased in a pattern of mild LVH. Systolic function was normal. The estimated ejection fraction was in the range of 60% to 65%. Wall motion was normal; there were no regional wall motion abnormalities. Doppler parameters are consistent with abnormal left ventricular relaxation (grade 1 diastolic dysfunction).  ------------------------------------------------------------------- Aortic valve:  Trileaflet; normal thickness, mildly calcified leaflets. Mobility was not restricted. Doppler: Transvalvular velocity was within the normal range. There was no stenosis. There was no regurgitation.  ------------------------------------------------------------------- Aorta: Aortic root: The aortic root was normal in size.  ------------------------------------------------------------------- Mitral valve:  Structurally normal valve.  Mobility was not restricted. Doppler: Transvalvular velocity was within the normal range. There was no evidence for stenosis. There was no regurgitation.  ------------------------------------------------------------------- Left atrium: The atrium was normal in size.  ------------------------------------------------------------------- Right ventricle: The cavity size was normal. Wall thickness was normal. Systolic function was normal.  ------------------------------------------------------------------- Pulmonic valve:  Not well visualized. The valve appears to be grossly normal.  Doppler: Transvalvular velocity was within the normal range. There was no evidence for stenosis.  ------------------------------------------------------------------- Tricuspid valve:  Structurally normal valve.  Doppler: Transvalvular velocity was within the  normal range. There was no regurgitation.  ------------------------------------------------------------------- Pulmonary artery:  The main pulmonary artery was normal-sized.  ------------------------------------------------------------------- Right atrium: The atrium was normal in size.  ------------------------------------------------------------------- Pericardium: There was no pericardial effusion.  ------------------------------------------------------------------- Systemic veins: Inferior vena cava: The vessel was normal in size.  ------------------------------------------------------------------- Measurements  Left ventricle             Value    Reference LV ID, ED, PLAX chordal    (L)   33.1 mm   43 - 52 LV ID, ES, PLAX chordal    (L)   22.4 mm   23 - 38 LV fx shortening, PLAX chordal     32  %   >=29 LV PW thickness, ED          10.8 mm   --------- IVS/LV PW ratio, ED          1.11     <=1.3 LV e&', lateral             5.77 cm/s  --------- LV E/e&', lateral            7.9     --------- LV e&', medial             3.7  cm/s  --------- LV E/e&', medial            12.32    --------- LV e&', average             4.74 cm/s  --------- LV E/e&', average            9.63     ---------  Ventricular septum           Value    Reference IVS thickness, ED           12  mm   ---------  LVOT                  Value    Reference LVOT ID, S               20  mm   --------- LVOT area  3.14 cm^2  ---------  Aorta                 Value    Reference Aortic root ID, ED           27  mm   ---------  Left atrium              Value    Reference LA ID, A-P, ES              31  mm   --------- LA ID/bsa, A-P             1.7  cm/m^2 <=2.2 LA volume, S              33.1 ml   --------- LA volume/bsa, S            18.2 ml/m^2 --------- LA volume, ES, 1-p A4C         31.7 ml   --------- LA volume/bsa, ES, 1-p A4C       17.4 ml/m^2 --------- LA volume, ES, 1-p A2C         31.3 ml   --------- LA volume/bsa, ES, 1-p A2C       17.2 ml/m^2 ---------  Mitral valve              Value    Reference Mitral E-wave peak velocity      45.6 cm/s  --------- Mitral A-wave peak velocity      75.7 cm/s  --------- Mitral deceleration time    (H)   303  ms   150 - 230 Mitral E/A ratio, peak         0.6     ---------  Systemic veins             Value    Reference Estimated CVP             3   mm Hg ---------  Right ventricle            Value    Reference TAPSE                 15.2 mm   ---------  Legend: (L) and (H) mark values outside specified reference range.  ------------------------------------------------------------------- Prepared and Electronically Authenticated by  Ezzard Standing, MD [facility] 2016-12-04T10:26:37       Assessment/Plan: S/P Procedure(s) (LRB): Left Heart Cath and Coronary Angiography (N/A)  Clinically stable  UA suggestive of UTI in patient w/ history of chronic interstitial cystitis and recurrent UTI's - will check urine culture and start empiric antibiotics  Tentatively for OR on Tuesday   I spent in excess of 15 minutes during the conduct of this hospital encounter and >50% of this time involved direct face-to-face encounter with the patient for counseling and/or coordination of their care.  Rexene Alberts, MD 10/31/2015 9:23 PM

## 2015-11-01 ENCOUNTER — Ambulatory Visit (HOSPITAL_COMMUNITY): Payer: Medicare Other

## 2015-11-01 ENCOUNTER — Inpatient Hospital Stay (HOSPITAL_COMMUNITY): Payer: Medicare Other

## 2015-11-01 ENCOUNTER — Encounter (HOSPITAL_COMMUNITY): Payer: Self-pay | Admitting: Interventional Cardiology

## 2015-11-01 ENCOUNTER — Other Ambulatory Visit: Payer: Self-pay | Admitting: *Deleted

## 2015-11-01 DIAGNOSIS — I119 Hypertensive heart disease without heart failure: Secondary | ICD-10-CM

## 2015-11-01 DIAGNOSIS — R079 Chest pain, unspecified: Secondary | ICD-10-CM

## 2015-11-01 DIAGNOSIS — I2510 Atherosclerotic heart disease of native coronary artery without angina pectoris: Secondary | ICD-10-CM

## 2015-11-01 LAB — PULMONARY FUNCTION TEST
DL/VA % pred: 77 %
DL/VA: 3.7 ml/min/mmHg/L
DLCO COR % PRED: 63 %
DLCO cor: 15.39 ml/min/mmHg
DLCO unc % pred: 63 %
DLCO unc: 15.39 ml/min/mmHg
FEF 25-75 POST: 0.96 L/s
FEF 25-75 Pre: 1.34 L/sec
FEF2575-%Change-Post: -28 %
FEF2575-%Pred-Post: 69 %
FEF2575-%Pred-Pre: 97 %
FEV1-%Change-Post: -2 %
FEV1-%Pred-Post: 92 %
FEV1-%Pred-Pre: 95 %
FEV1-POST: 1.78 L
FEV1-Pre: 1.84 L
FEV1FVC-%Change-Post: 3 %
FEV1FVC-%PRED-PRE: 97 %
FEV6-%Change-Post: -7 %
FEV6-%Pred-Post: 96 %
FEV6-%Pred-Pre: 103 %
FEV6-POST: 2.35 L
FEV6-Pre: 2.53 L
FEV6FVC-%Change-Post: 0 %
FEV6FVC-%PRED-POST: 103 %
FEV6FVC-%PRED-PRE: 103 %
FVC-%CHANGE-POST: -6 %
FVC-%PRED-POST: 92 %
FVC-%PRED-PRE: 99 %
FVC-POST: 2.39 L
FVC-PRE: 2.56 L
POST FEV6/FVC RATIO: 98 %
PRE FEV1/FVC RATIO: 72 %
PRE FEV6/FVC RATIO: 99 %
Post FEV1/FVC ratio: 75 %
RV % pred: 112 %
RV: 2.7 L
TLC % PRED: 101 %
TLC: 5.11 L

## 2015-11-01 LAB — CBC
HEMATOCRIT: 41.1 % (ref 36.0–46.0)
Hemoglobin: 13.4 g/dL (ref 12.0–15.0)
MCH: 30.7 pg (ref 26.0–34.0)
MCHC: 32.6 g/dL (ref 30.0–36.0)
MCV: 94.1 fL (ref 78.0–100.0)
PLATELETS: 168 10*3/uL (ref 150–400)
RBC: 4.37 MIL/uL (ref 3.87–5.11)
RDW: 13 % (ref 11.5–15.5)
WBC: 5.3 10*3/uL (ref 4.0–10.5)

## 2015-11-01 LAB — HEPARIN LEVEL (UNFRACTIONATED): Heparin Unfractionated: 0.52 IU/mL (ref 0.30–0.70)

## 2015-11-01 LAB — SURGICAL PCR SCREEN
MRSA, PCR: POSITIVE — AB
Staphylococcus aureus: POSITIVE — AB

## 2015-11-01 LAB — HEMOGLOBIN A1C
HEMOGLOBIN A1C: 5.2 % (ref 4.8–5.6)
Mean Plasma Glucose: 103 mg/dL

## 2015-11-01 MED ORDER — PHENYLEPHRINE HCL 10 MG/ML IJ SOLN
30.0000 ug/min | INTRAVENOUS | Status: DC
  Filled 2015-11-01: qty 2

## 2015-11-01 MED ORDER — CEFUROXIME SODIUM 1.5 G IJ SOLR
1.5000 g | INTRAMUSCULAR | Status: AC
  Administered 2015-11-02: 1.5 g via INTRAVENOUS
  Filled 2015-11-01: qty 1.5

## 2015-11-01 MED ORDER — SODIUM CHLORIDE 0.9 % IV SOLN
INTRAVENOUS | Status: DC
  Filled 2015-11-01: qty 30

## 2015-11-01 MED ORDER — SODIUM CHLORIDE 0.9 % IV SOLN
INTRAVENOUS | Status: DC
  Filled 2015-11-01: qty 2.5

## 2015-11-01 MED ORDER — SODIUM CHLORIDE 0.9 % IV SOLN
1250.0000 mg | INTRAVENOUS | Status: AC
  Administered 2015-11-02: 1250 mg via INTRAVENOUS
  Filled 2015-11-01: qty 1250

## 2015-11-01 MED ORDER — CHLORHEXIDINE GLUCONATE 4 % EX LIQD
60.0000 mL | Freq: Once | CUTANEOUS | Status: AC
  Filled 2015-11-01: qty 60

## 2015-11-01 MED ORDER — DOPAMINE-DEXTROSE 3.2-5 MG/ML-% IV SOLN
0.0000 ug/kg/min | INTRAVENOUS | Status: DC
  Filled 2015-11-01: qty 250

## 2015-11-01 MED ORDER — PLASMA-LYTE 148 IV SOLN
INTRAVENOUS | Status: AC
  Administered 2015-11-02: 500 mL
  Filled 2015-11-01: qty 2.5

## 2015-11-01 MED ORDER — CHLORHEXIDINE GLUCONATE 0.12 % MT SOLN
15.0000 mL | Freq: Once | OROMUCOSAL | Status: AC
Start: 2015-11-02 — End: 2015-11-02
  Administered 2015-11-02: 15 mL via OROMUCOSAL
  Filled 2015-11-01: qty 15

## 2015-11-01 MED ORDER — NITROGLYCERIN IN D5W 200-5 MCG/ML-% IV SOLN
2.0000 ug/min | INTRAVENOUS | Status: AC
  Administered 2015-11-02: 5 ug/min via INTRAVENOUS
  Filled 2015-11-01: qty 250

## 2015-11-01 MED ORDER — DEXTROSE 5 % IV SOLN
0.0000 ug/min | INTRAVENOUS | Status: DC
  Filled 2015-11-01: qty 4

## 2015-11-01 MED ORDER — SODIUM CHLORIDE 0.9 % IV SOLN
INTRAVENOUS | Status: DC
  Filled 2015-11-01: qty 40

## 2015-11-01 MED ORDER — BISACODYL 5 MG PO TBEC
5.0000 mg | DELAYED_RELEASE_TABLET | Freq: Once | ORAL | Status: AC
  Administered 2015-11-01: 5 mg via ORAL
  Filled 2015-11-01: qty 1

## 2015-11-01 MED ORDER — MAGNESIUM SULFATE 50 % IJ SOLN
40.0000 meq | INTRAMUSCULAR | Status: DC
  Filled 2015-11-01: qty 10

## 2015-11-01 MED ORDER — ALBUTEROL SULFATE (2.5 MG/3ML) 0.083% IN NEBU
2.5000 mg | INHALATION_SOLUTION | Freq: Once | RESPIRATORY_TRACT | Status: AC
  Administered 2015-11-01: 2.5 mg via RESPIRATORY_TRACT

## 2015-11-01 MED ORDER — CEFUROXIME SODIUM 750 MG IJ SOLR
750.0000 mg | INTRAMUSCULAR | Status: DC
  Filled 2015-11-01 (×2): qty 750

## 2015-11-01 MED ORDER — METOPROLOL TARTRATE 12.5 MG HALF TABLET
12.5000 mg | ORAL_TABLET | Freq: Once | ORAL | Status: AC
  Administered 2015-11-02: 12.5 mg via ORAL
  Filled 2015-11-01: qty 1

## 2015-11-01 MED ORDER — TEMAZEPAM 15 MG PO CAPS: 15.0000 mg | ORAL_CAPSULE | Freq: Once | ORAL | Status: DC | PRN

## 2015-11-01 MED ORDER — CHLORHEXIDINE GLUCONATE 4 % EX LIQD
60.0000 mL | Freq: Once | CUTANEOUS | Status: AC
  Administered 2015-11-01: 4 via TOPICAL

## 2015-11-01 MED ORDER — POTASSIUM CHLORIDE 2 MEQ/ML IV SOLN
80.0000 meq | INTRAVENOUS | Status: DC
  Filled 2015-11-01: qty 40

## 2015-11-01 MED ORDER — DEXMEDETOMIDINE HCL IN NACL 400 MCG/100ML IV SOLN
0.1000 ug/kg/h | INTRAVENOUS | Status: DC
  Filled 2015-11-01: qty 100

## 2015-11-01 MED ORDER — VANCOMYCIN HCL 1000 MG IV SOLR
INTRAVENOUS | Status: DC
  Filled 2015-11-01: qty 1000

## 2015-11-01 NOTE — Care Management Important Message (Signed)
Important Message  Patient Details  Name: Emily Wagner MRN: [id number] Date of Birth: June 03, 1933   Medicare Important Message Given:  Yes    Akiba Melfi Abena 11/01/2015, 9:22 AM

## 2015-11-01 NOTE — Progress Notes (Signed)
CARDIAC REHAB PHASE I   PRE:  Rate/Rhythm: 84 SR  BP:  Supine: 138/80  Sitting:   Standing:    SaO2: 95%RA  MODE:  Ambulation: 550 ft   POST:  Rate/Rhythm: 88  BP:  Supine: 140/82  Sitting:   Standing:    SaO2: 96%RA 1045-1140 Pt walked 550 ft with rolling walker independently with steady gait. Tolerated well. Likes to walk. No CP. Discussed with pt the importance of mobility and IS after surgery and discussed sternal precautions. Gave pt IS and she can get to 1355ml. Pt stated she plans to go home to disabled daughter after discharge. She stated that the church would provide meals for 2 weeks but she may need HH services. Told pt case manager would follow up after surgery. Gave pt [facility] booklet and care guide. Wrote down how to view pre op video but pt did not want to view.   Graylon Good, RN BSN  11/01/2015 11:37 AM

## 2015-11-01 NOTE — Progress Notes (Signed)
Paged Roxy Manns, MD to get consent for CABG. Shirley Muscat 6:53 PM

## 2015-11-01 NOTE — Care Management Note (Signed)
Case Management Note Marvetta Gibbons RN, BSN Unit 2W-Case Manager [phone]  Patient Details  Name: Joen Hevner MRN: [id number] Date of Birth: 1933/08/11  Subjective/Objective:  Pt admitted with [place]- Catheterization performed by Dr. Tamala Julian demonstrates high-grade proximal stenosis of the left anterior descending coronary artery with anatomical characteristics that are felt to be relatively unfavorable for PCI and stenting-- plan for CABG on 11/02/15                  Action/Plan: PTA pt lived at home with disabled daughter who requires pt to physically assist her. Pt has remained very active- still drives, cooks and takes care of household chores. Pt does use a cane for stability. CM will follow post op for progression- this may be a difficult situation with pt's physical limitations post op.   Expected Discharge Date:           Expected Discharge Plan:  [facility] [place]  In-House Referral:     Discharge planning Services  CM Consult  Post Acute Care Choice:    Choice offered to:     DME Arranged:    DME Agency:     HH Arranged:    [place] Agency:     Status of Service:  In process, will continue to follow  Medicare Important Message Given:  Yes Date Medicare IM Given:    Medicare IM give by:    Date Additional Medicare IM Given:    Additional Medicare Important Message give by:     If discussed at [place] of Stay Meetings, dates discussed:    Additional Comments:  Dawayne Patricia, RN 11/01/2015, 10:03 AM

## 2015-11-01 NOTE — Progress Notes (Signed)
ANTICOAGULATION CONSULT NOTE - Follow Up Consult  Pharmacy Consult for heparin  Indication: s/p cath for CABG  Allergies  Allergen Reactions  . Nitrofurantoin Other (See Comments)    Liver failure  . Nsaids Other (See Comments)    Kidney failure  . Other Other (See Comments)    Most antibiotics require supervision per patient  . Flonase [Fluticasone Propionate] Other (See Comments)    headache  . Prevnar 13 [Pneumococcal 13-Val Conj Vacc] Other (See Comments)    Nerve damage in left arm  . Alendronate Sodium Other (See Comments)  . Ciprofloxacin Other (See Comments)    Patient denies allergy--"I take Cipro for bladder infections"  . Codeine Other (See Comments)  . Crestor [Rosuvastatin Calcium] Other (See Comments)  . Darvon Other (See Comments)  . Lipitor [Atorvastatin Calcium] Other (See Comments)  . Pravachol Other (See Comments)  . Propranolol Hcl Other (See Comments)    Unknown  . Sulfa Antibiotics Other (See Comments)  . Sulfamethoxazole Other (See Comments)    Unknown  . Ultram [Tramadol Hcl] Itching  . Welchol [Colesevelam Hcl] Other (See Comments)  . Zetia [Ezetimibe] Other (See Comments)  . Zocor [Simvastatin] Other (See Comments)    Patient Measurements: Height: 5\' 4"  (162.6 cm) Weight: 159 lb 2.8 oz (72.2 kg) IBW/kg (Calculated) : 54.7  Vital Signs: Temp: 97.9 F (36.6 C) (12/05 0408) Temp Source: Oral (12/05 0408) BP: 135/76 mmHg (12/05 0408) Pulse Rate: 66 (12/05 0408)  Labs:  Recent Labs  10/29/15 1355 10/29/15 2000 10/30/15 0036 10/30/15 0137  10/30/15 1954 10/31/15 0215 11/01/15 0352  HGB 14.2  --   --  13.6  --   --  13.9 13.4  HCT 43.6  --   --  41.3  --   --  42.0 41.1  PLT 192  --   --  180  --   --  190 168  APTT  --   --   --   --   --   --  98*  --   LABPROT 14.0  --   --   --   --   --  14.5  --   INR 1.06  --   --   --   --   --  1.11  --   HEPARINUNFRC  --   --   --   --   < > 0.46 0.44 0.52  CREATININE 0.95  --   --   --    --   --  0.85  --   TROPONINI <0.03 <0.03 0.05*  --   --   --   --   --   < > = values in this interval not displayed.  Estimated Creatinine Clearance: 51.4 mL/min (by C-G formula based on Cr of 0.85).   Medications:  Scheduled:  . aspirin EC  81 mg Oral Daily  . calcium carbonate  1 tablet Oral Q breakfast  . cefTAZidime (FORTAZ)  IV  1 g Intravenous Q12H  . levothyroxine  100 mcg Oral QAC breakfast  . loratadine  10 mg Oral Daily  . nitroGLYCERIN  0.5 inch Topical 4 times per day  . sodium chloride  3 mL Intravenous Q12H  . verapamil  120 mg Oral BID   Infusions:  . heparin 1,050 Units/hr (11/01/15 0046)    Assessment 81 y/o female with severe 3v CAD on Heparin until planned CABG on Tuesday. Heparin level is at goal (HL= 0.52), and CBC is stable.  Heparin stops at midnight for OR on 12/6.   Goal of Therapy:  Heparin level 0.3-0.7 units/ml Monitor platelets by anticoagulation protocol: Yes   Plan:  -No  heparin changes needed -Will follow progress post procedure  Hildred Laser, Pharm D 11/01/2015 11:55 AM

## 2015-11-01 NOTE — Progress Notes (Signed)
TCTS BRIEF PROGRESS NOTE   Clinically stable. For CABG in am tomorrow. All questions answered.  Rexene Alberts, MD 11/01/2015

## 2015-11-01 NOTE — Progress Notes (Addendum)
VASCULAR LAB PRELIMINARY  PRELIMINARY  PRELIMINARY  PRELIMINARY  Pre-op Cardiac Surgery  Carotid Findings:  Right :  1-39% ICA stenosis.  Left : 40% to 59% ICA stenosis. Bilateral : Vertebral artery flow is antegrade.     Upper Extremity Right Left  Brachial Pressures 132 Triphasic 137 Triphasic  Radial Waveforms Triphasic Triphasic  Ulnar Waveforms Triphasic Triphasic  Palmar Arch (Allen's Test) Normal Normal   Findings:  Doppler waveforms remained normal bilaterally with both radial and ulnar compressions   Findings:  Palpable pedal pulses   Infant Doane, RVS 11/01/2015, 7:23 PM

## 2015-11-01 NOTE — Progress Notes (Signed)
Patient Name: Emily Wagner Date of Encounter: 11/01/2015   SUBJECTIVE  For CABG tomorrow. Denies cp or sob.   CURRENT MEDS . aspirin EC  81 mg Oral Daily  . calcium carbonate  1 tablet Oral Q breakfast  . cefTAZidime (FORTAZ)  IV  1 g Intravenous Q12H  . levothyroxine  100 mcg Oral QAC breakfast  . loratadine  10 mg Oral Daily  . nitroGLYCERIN  0.5 inch Topical 4 times per day  . sodium chloride  3 mL Intravenous Q12H  . verapamil  120 mg Oral BID    OBJECTIVE  Filed Vitals:   10/31/15 0442 10/31/15 1402 10/31/15 1946 11/01/15 0408  BP: 146/78 134/72 156/79 135/76  Pulse: 73 76 76 66  Temp: 97.7 F (36.5 C) 97.9 F (36.6 C) 98.3 F (36.8 C) 97.9 F (36.6 C)  TempSrc: Oral Oral Oral Oral  Resp: 18 19 18 18   Height:      Weight:      SpO2: 95% 95% 94% 98%    Intake/Output Summary (Last 24 hours) at 11/01/15 1223 Last data filed at 11/01/15 0810  Gross per 24 hour  Intake  631.5 ml  Output    600 ml  Net   31.5 ml   Filed Weights   10/29/15 1232  Weight: 159 lb 2.8 oz (72.2 kg)    PHYSICAL EXAM  General: Pleasant, NAD. Neuro: Alert and oriented X 3. Moves all extremities spontaneously. Psych: Normal affect. HEENT:  Normal  Neck: Supple without bruits or JVD. Lungs:  Resp regular and unlabored, CTA. Heart: RRR no s3, s4, or murmurs. Abdomen: Soft, non-tender, non-distended, BS + x 4.  Extremities: No clubbing, cyanosis or edema. DP/PT/Radials 2+ and equal bilaterally. R cath site without hematoma.   Accessory Clinical Findings  CBC  Recent Labs  10/29/15 1355  10/31/15 0215 11/01/15 0352  WBC 5.7  < > 6.8 5.3  NEUTROABS 3.7  --   --   --   HGB 14.2  < > 13.9 13.4  HCT 43.6  < > 42.0 41.1  MCV 94.6  < > 94.4 94.1  PLT 192  < > 190 168  < > = values in this interval not displayed. Basic Metabolic Panel  Recent Labs  10/29/15 1355 10/31/15 0215  NA 142 138  K 3.9 3.5  CL 108 106  CO2 26 23  GLUCOSE 83 92  BUN 20 15  CREATININE  0.95 0.85  CALCIUM 9.0 9.1   Liver Function Tests  Recent Labs  10/31/15 0215  AST 19  ALT 10*  ALKPHOS 93  BILITOT 1.0  PROT 6.6  ALBUMIN 3.2*   No results for input(s): LIPASE, AMYLASE in the last 72 hours. Cardiac Enzymes  Recent Labs  10/29/15 1355 10/29/15 2000 10/30/15 0036  TROPONINI <0.03 <0.03 0.05*   Fasting Lipid Panel  Recent Labs  10/31/15 0215  CHOL 212*  HDL 39*  LDLCALC 153*  TRIG 99  CHOLHDL 5.4   Thyroid Function Tests  Recent Labs  10/29/15 1355  TSH 1.734    TELE  NSR at rate of 70-80s.   Radiology/Studies  Dg Chest 2 View  10/31/2015  CLINICAL DATA:  Postop left heart catheterization on October 29, 2015. Chest pain. EXAM: CHEST  2 VIEW COMPARISON:  Chest x-ray dated 10/21/2015. FINDINGS: Heart size is upper normal, unchanged. Overall cardiomediastinal silhouette is stable in size and configuration. Again noted are atherosclerotic changes of the thoracic aorta with associated age-related  aortic ectasia. Probable mild scarring/atelectasis at each lung base, unchanged. Lungs otherwise clear. No pleural effusion seen. No pneumothorax seen. Mild degenerative change noted within the thoracic spine. Surgical clips in the right upper quadrant likely related to previous cholecystectomy. Osseous and soft tissue structures about the chest are otherwise unremarkable. IMPRESSION: Stable chest x-ray. No evidence of acute cardiopulmonary abnormality. Electronically Signed   By: Franki Cabot M.D.   On: 10/31/2015 08:56   Dg Chest 2 View  10/21/2015  CLINICAL DATA:  Left-sided chest pain. Shortness of breath and fatigue EXAM: CHEST  2 VIEW COMPARISON:  June 02, 2015 FINDINGS: There is patchy bibasilar atelectasis. Lungs elsewhere are clear. Heart size and pulmonary vascularity are normal. There is atherosclerotic calcification in the aorta. No adenopathy. No pneumothorax. There is degenerative change in the thoracic spine. There is calcification in the right  carotid artery. There are surgical clips in the right upper quadrant of the abdomen. IMPRESSION: Patchy bibasilar atelectasis. No edema or consolidation. Calcification is noted in the right carotid artery. Electronically Signed   By: Lowella Grip III M.D.   On: 10/21/2015 17:45    ASSESSMENT AND PLAN  1. Unstable angina (HCC) - Cath showed high-grade ostial to mid LAD. For CABG tomorrow - off-pump LIMA to LAD, Dr. Roxy Manns. - Continue ASA, IV heparin.   2. ASCVD (arteriosclerotic cardiovascular disease) - As above    3. Benign hypertensive heart disease without heart failure - Stable and well controlled. - Continue Verapamil 120mg  BID.   4. HL - LDL 153. Goal less than 70. - Allergic to statin. Consider outpatient lipid clinic eval.   5. Infection of urinary tract - history of chronic interstitial cystitis and recurrent UTI's  - pending urine culture.  - On emperic abx    Signed, Jomaira Darr PA-C Pager [phone]

## 2015-11-02 ENCOUNTER — Encounter (HOSPITAL_COMMUNITY)
Admission: AD | Disposition: A | Discharge status: Home Health | Payer: Medicare Other | Source: Ambulatory Visit | Attending: Thoracic Surgery (Cardiothoracic Vascular Surgery)

## 2015-11-02 ENCOUNTER — Inpatient Hospital Stay (HOSPITAL_COMMUNITY): Payer: Medicare Other | Admitting: Certified Registered"

## 2015-11-02 ENCOUNTER — Inpatient Hospital Stay (HOSPITAL_COMMUNITY): Payer: Medicare Other

## 2015-11-02 ENCOUNTER — Encounter (HOSPITAL_COMMUNITY): Payer: Self-pay | Admitting: Certified Registered"

## 2015-11-02 DIAGNOSIS — I2510 Atherosclerotic heart disease of native coronary artery without angina pectoris: Secondary | ICD-10-CM

## 2015-11-02 DIAGNOSIS — Z951 Presence of aortocoronary bypass graft: Secondary | ICD-10-CM

## 2015-11-02 HISTORY — PX: CORONARY ARTERY BYPASS GRAFT: SHX141

## 2015-11-02 HISTORY — DX: Presence of aortocoronary bypass graft: Z95.1

## 2015-11-02 HISTORY — PX: TEE WITHOUT CARDIOVERSION: SHX5443

## 2015-11-02 LAB — CBC
HCT: 41.3 % (ref 36.0–46.0)
HCT: 33.4 % — ABNORMAL LOW (ref 36.0–46.0)
HEMATOCRIT: 38.9 % (ref 36.0–46.0)
HEMOGLOBIN: 13.4 g/dL (ref 12.0–15.0)
Hemoglobin: 12.6 g/dL (ref 12.0–15.0)
Hemoglobin: 11.2 g/dL — ABNORMAL LOW (ref 12.0–15.0)
MCH: 30.7 pg (ref 26.0–34.0)
MCH: 30.6 pg (ref 26.0–34.0)
MCH: 31.5 pg (ref 26.0–34.0)
MCHC: 32.4 g/dL (ref 30.0–36.0)
MCHC: 32.4 g/dL (ref 30.0–36.0)
MCHC: 33.5 g/dL (ref 30.0–36.0)
MCV: 94.7 fL (ref 78.0–100.0)
MCV: 94.4 fL (ref 78.0–100.0)
MCV: 94.1 fL (ref 78.0–100.0)
PLATELETS: 182 10*3/uL (ref 150–400)
PLATELETS: 117 10*3/uL — AB (ref 150–400)
Platelets: 162 10*3/uL (ref 150–400)
RBC: 4.36 MIL/uL (ref 3.87–5.11)
RBC: 4.12 MIL/uL (ref 3.87–5.11)
RBC: 3.55 MIL/uL — AB (ref 3.87–5.11)
RDW: 13.3 % (ref 11.5–15.5)
RDW: 13.3 % (ref 11.5–15.5)
RDW: 13.2 % (ref 11.5–15.5)
WBC: 6.8 10*3/uL (ref 4.0–10.5)
WBC: 9 10*3/uL (ref 4.0–10.5)
WBC: 8 10*3/uL (ref 4.0–10.5)

## 2015-11-02 LAB — POCT I-STAT, CHEM 8
BUN: 22 mg/dL — AB (ref 6–20)
BUN: 19 mg/dL (ref 6–20)
BUN: 19 mg/dL (ref 6–20)
CALCIUM ION: 1.11 mmol/L — AB (ref 1.13–1.30)
CHLORIDE: 105 mmol/L (ref 101–111)
Calcium, Ion: 1.16 mmol/L (ref 1.13–1.30)
Calcium, Ion: 1.15 mmol/L (ref 1.13–1.30)
Chloride: 106 mmol/L (ref 101–111)
Chloride: 105 mmol/L (ref 101–111)
Creatinine, Ser: 0.9 mg/dL (ref 0.44–1.00)
Creatinine, Ser: 0.8 mg/dL (ref 0.44–1.00)
Creatinine, Ser: 0.8 mg/dL (ref 0.44–1.00)
Glucose, Bld: 89 mg/dL (ref 65–99)
Glucose, Bld: 89 mg/dL (ref 65–99)
Glucose, Bld: 94 mg/dL (ref 65–99)
HCT: 36 % (ref 36.0–46.0)
HEMATOCRIT: 32 % — AB (ref 36.0–46.0)
HEMATOCRIT: 33 % — AB (ref 36.0–46.0)
HEMOGLOBIN: 10.9 g/dL — AB (ref 12.0–15.0)
HEMOGLOBIN: 11.2 g/dL — AB (ref 12.0–15.0)
Hemoglobin: 12.2 g/dL (ref 12.0–15.0)
POTASSIUM: 3.8 mmol/L (ref 3.5–5.1)
POTASSIUM: 3.6 mmol/L (ref 3.5–5.1)
Potassium: 3.5 mmol/L (ref 3.5–5.1)
SODIUM: 141 mmol/L (ref 135–145)
SODIUM: 142 mmol/L (ref 135–145)
Sodium: 143 mmol/L (ref 135–145)
TCO2: 27 mmol/L (ref 0–100)
TCO2: 26 mmol/L (ref 0–100)
TCO2: 26 mmol/L (ref 0–100)

## 2015-11-02 LAB — POCT I-STAT 3, ART BLOOD GAS (G3+)
ACID-BASE DEFICIT: 7 mmol/L — AB (ref 0.0–2.0)
ACID-BASE EXCESS: 2 mmol/L (ref 0.0–2.0)
Acid-Base Excess: 1 mmol/L (ref 0.0–2.0)
Acid-base deficit: 1 mmol/L (ref 0.0–2.0)
BICARBONATE: 22.5 meq/L (ref 20.0–24.0)
BICARBONATE: 24.7 meq/L — AB (ref 20.0–24.0)
Bicarbonate: 26.3 mEq/L — ABNORMAL HIGH (ref 20.0–24.0)
Bicarbonate: 19.2 mEq/L — ABNORMAL LOW (ref 20.0–24.0)
O2 Saturation: 100 %
O2 Saturation: 100 %
O2 Saturation: 100 %
O2 Saturation: 98 %
PCO2 ART: 40.5 mmHg (ref 35.0–45.0)
PCO2 ART: 32 mmHg — AB (ref 35.0–45.0)
PH ART: 7.42 (ref 7.350–7.450)
PH ART: 7.449 (ref 7.350–7.450)
PH ART: 7.284 — AB (ref 7.350–7.450)
PO2 ART: 377 mmHg — AB (ref 80.0–100.0)
PO2 ART: 282 mmHg — AB (ref 80.0–100.0)
PO2 ART: 156 mmHg — AB (ref 80.0–100.0)
Patient temperature: 35.4
TCO2: 27 mmol/L (ref 0–100)
TCO2: 23 mmol/L (ref 0–100)
TCO2: 26 mmol/L (ref 0–100)
TCO2: 20 mmol/L (ref 0–100)
pCO2 arterial: 32.5 mmHg — ABNORMAL LOW (ref 35.0–45.0)
pCO2 arterial: 40.4 mmHg (ref 35.0–45.0)
pH, Arterial: 7.49 — ABNORMAL HIGH (ref 7.350–7.450)
pO2, Arterial: 109 mmHg — ABNORMAL HIGH (ref 80.0–100.0)

## 2015-11-02 LAB — PROTIME-INR
INR: 1.18 (ref 0.00–1.49)
Prothrombin Time: 15.2 seconds (ref 11.6–15.2)

## 2015-11-02 LAB — POCT I-STAT 3, VENOUS BLOOD GAS (G3P V)
ACID-BASE DEFICIT: 5 mmol/L — AB (ref 0.0–2.0)
Bicarbonate: 20.9 mEq/L (ref 20.0–24.0)
O2 Saturation: 63 %
PCO2 VEN: 42 mmHg — AB (ref 45.0–50.0)
PO2 VEN: 35 mmHg (ref 30.0–45.0)
Patient temperature: 36.3
TCO2: 22 mmol/L (ref 0–100)
pH, Ven: 7.301 — ABNORMAL HIGH (ref 7.250–7.300)

## 2015-11-02 LAB — URINE CULTURE

## 2015-11-02 LAB — GLUCOSE, CAPILLARY: GLUCOSE-CAPILLARY: 106 mg/dL — AB (ref 65–99)

## 2015-11-02 LAB — POCT I-STAT 4, (NA,K, GLUC, HGB,HCT)
GLUCOSE: 111 mg/dL — AB (ref 65–99)
HEMATOCRIT: 35 % — AB (ref 36.0–46.0)
Hemoglobin: 11.9 g/dL — ABNORMAL LOW (ref 12.0–15.0)
Potassium: 3.7 mmol/L (ref 3.5–5.1)
Sodium: 140 mmol/L (ref 135–145)

## 2015-11-02 LAB — CREATININE, SERUM
CREATININE: 0.75 mg/dL (ref 0.44–1.00)
GFR calc non Af Amer: >60 mL/min (ref >60)

## 2015-11-02 LAB — MAGNESIUM: MAGNESIUM: 2.5 mg/dL — AB (ref 1.7–2.4)

## 2015-11-02 LAB — HEPARIN LEVEL (UNFRACTIONATED): HEPARIN UNFRACTIONATED: 0.14 [IU]/mL — AB (ref 0.30–0.70)

## 2015-11-02 LAB — APTT: aPTT: 32 seconds (ref 24–37)

## 2015-11-02 SURGERY — CORONARY ARTERY BYPASS GRAFTING (CABG)
Anesthesia: General | Site: Chest

## 2015-11-02 MED ORDER — TRAMADOL HCL 50 MG PO TABS
50.0000 mg | ORAL_TABLET | ORAL | Status: DC | PRN
  Administered 2015-11-03: 100 mg via ORAL
  Filled 2015-11-02: qty 2

## 2015-11-02 MED ORDER — DEXMEDETOMIDINE HCL IN NACL 200 MCG/50ML IV SOLN
0.0000 ug/kg/h | INTRAVENOUS | Status: DC
  Administered 2015-11-02: 0.2 ug/kg/h via INTRAVENOUS
  Filled 2015-11-02: qty 50

## 2015-11-02 MED ORDER — FAMOTIDINE IN NACL 20-0.9 MG/50ML-% IV SOLN
20.0000 mg | Freq: Two times a day (BID) | INTRAVENOUS | Status: AC
  Administered 2015-11-02: 20 mg via INTRAVENOUS

## 2015-11-02 MED ORDER — MAGNESIUM SULFATE 4 GM/100ML IV SOLN
4.0000 g | Freq: Once | INTRAVENOUS | Status: AC
  Administered 2015-11-02: 4 g via INTRAVENOUS
  Filled 2015-11-02: qty 100

## 2015-11-02 MED ORDER — SUFENTANIL CITRATE 250 MCG/5ML IV SOLN
INTRAVENOUS | Status: AC
  Filled 2015-11-02: qty 5

## 2015-11-02 MED ORDER — POTASSIUM CHLORIDE 10 MEQ/50ML IV SOLN
10.0000 meq | INTRAVENOUS | Status: AC
  Administered 2015-11-02 (×3): 10 meq via INTRAVENOUS

## 2015-11-02 MED ORDER — MUPIROCIN 2 % EX OINT
TOPICAL_OINTMENT | Freq: Two times a day (BID) | CUTANEOUS | Status: DC
  Administered 2015-11-02: 06:00:00 via NASAL
  Filled 2015-11-02 (×2): qty 22

## 2015-11-02 MED ORDER — ACETAMINOPHEN 500 MG PO TABS
1000.0000 mg | ORAL_TABLET | Freq: Four times a day (QID) | ORAL | Status: AC
  Administered 2015-11-03 – 2015-11-07 (×7): 1000 mg via ORAL
  Filled 2015-11-02 (×16): qty 2

## 2015-11-02 MED ORDER — BISACODYL 5 MG PO TBEC
10.0000 mg | DELAYED_RELEASE_TABLET | Freq: Every day | ORAL | Status: DC
  Administered 2015-11-03 – 2015-11-04 (×2): 10 mg via ORAL
  Filled 2015-11-02 (×4): qty 2

## 2015-11-02 MED ORDER — SODIUM CHLORIDE 0.9 % IV SOLN
INTRAVENOUS | Status: AC
  Administered 2015-11-02: 100 mL/h via INTRAVENOUS

## 2015-11-02 MED ORDER — SODIUM BICARBONATE 8.4 % IV SOLN
50.0000 meq | Freq: Once | INTRAVENOUS | Status: AC
  Administered 2015-11-02: 50 meq via INTRAVENOUS

## 2015-11-02 MED ORDER — 0.9 % SODIUM CHLORIDE (POUR BTL) OPTIME
TOPICAL | Status: DC | PRN
  Administered 2015-11-02: 6000 mL

## 2015-11-02 MED ORDER — DEXMEDETOMIDINE HCL IN NACL 200 MCG/50ML IV SOLN
INTRAVENOUS | Status: DC | PRN
  Administered 2015-11-02: 0.4 ug/kg/h via INTRAVENOUS

## 2015-11-02 MED ORDER — NITROGLYCERIN IN D5W 200-5 MCG/ML-% IV SOLN: 0.0000 ug/min | INTRAVENOUS | Status: DC

## 2015-11-02 MED ORDER — SODIUM CHLORIDE 0.9 % IV SOLN
10.0000 g | INTRAVENOUS | Status: DC | PRN
  Administered 2015-11-02: 5 g/h via INTRAVENOUS

## 2015-11-02 MED ORDER — PANTOPRAZOLE SODIUM 40 MG PO TBEC
40.0000 mg | DELAYED_RELEASE_TABLET | Freq: Every day | ORAL | Status: DC
  Administered 2015-11-04 – 2015-11-08 (×5): 40 mg via ORAL
  Filled 2015-11-02 (×5): qty 1

## 2015-11-02 MED ORDER — DEXTROSE 5 % IV SOLN
1.5000 g | Freq: Two times a day (BID) | INTRAVENOUS | Status: AC
  Administered 2015-11-02 – 2015-11-04 (×4): 1.5 g via INTRAVENOUS
  Filled 2015-11-02 (×4): qty 1.5

## 2015-11-02 MED ORDER — SUFENTANIL CITRATE 50 MCG/ML IV SOLN
INTRAVENOUS | Status: DC | PRN
  Administered 2015-11-02: 5 ug via INTRAVENOUS
  Administered 2015-11-02: 10 ug via INTRAVENOUS
  Administered 2015-11-02: 5 ug via INTRAVENOUS
  Administered 2015-11-02: 10 ug via INTRAVENOUS
  Administered 2015-11-02: 20 ug via INTRAVENOUS
  Administered 2015-11-02: 40 ug via INTRAVENOUS

## 2015-11-02 MED ORDER — SODIUM CHLORIDE 0.45 % IV SOLN
INTRAVENOUS | Status: DC | PRN
Start: 2015-11-02 — End: 2015-11-03

## 2015-11-02 MED ORDER — LACTATED RINGERS IV SOLN: 500.0000 mL | Freq: Once | INTRAVENOUS | Status: DC | PRN

## 2015-11-02 MED ORDER — LACTATED RINGERS IV SOLN
INTRAVENOUS | Status: DC | PRN
  Administered 2015-11-02: 07:00:00 via INTRAVENOUS

## 2015-11-02 MED ORDER — DOCUSATE SODIUM 100 MG PO CAPS
200.0000 mg | ORAL_CAPSULE | Freq: Every day | ORAL | Status: DC
  Administered 2015-11-03 – 2015-11-05 (×3): 200 mg via ORAL
  Filled 2015-11-02 (×4): qty 2

## 2015-11-02 MED ORDER — INSULIN REGULAR BOLUS VIA INFUSION
0.0000 [IU] | Freq: Three times a day (TID) | INTRAVENOUS | Status: DC
  Filled 2015-11-02: qty 10

## 2015-11-02 MED ORDER — VECURONIUM BROMIDE 10 MG IV SOLR
INTRAVENOUS | Status: DC | PRN
Start: 2015-11-02 — End: 2015-11-02
  Administered 2015-11-02: 8 mg via INTRAVENOUS

## 2015-11-02 MED ORDER — METOPROLOL TARTRATE 25 MG/10 ML ORAL SUSPENSION: 12.5000 mg | Freq: Two times a day (BID) | ORAL | Status: DC

## 2015-11-02 MED ORDER — VANCOMYCIN HCL IN DEXTROSE 1-5 GM/200ML-% IV SOLN
1000.0000 mg | Freq: Once | INTRAVENOUS | Status: AC
  Administered 2015-11-02: 1000 mg via INTRAVENOUS
  Filled 2015-11-02: qty 200

## 2015-11-02 MED ORDER — SODIUM CHLORIDE 0.9 % IJ SOLN
3.0000 mL | Freq: Two times a day (BID) | INTRAMUSCULAR | Status: DC
  Administered 2015-11-03 – 2015-11-07 (×6): 3 mL via INTRAVENOUS

## 2015-11-02 MED ORDER — METOPROLOL TARTRATE 12.5 MG HALF TABLET
12.5000 mg | ORAL_TABLET | Freq: Two times a day (BID) | ORAL | Status: DC
  Administered 2015-11-03 (×2): 12.5 mg via ORAL
  Filled 2015-11-02 (×2): qty 1

## 2015-11-02 MED ORDER — ACETAMINOPHEN 160 MG/5ML PO SOLN: 1000.0000 mg | Freq: Four times a day (QID) | ORAL | Status: DC

## 2015-11-02 MED ORDER — OXYCODONE HCL 5 MG PO TABS: 5.0000 mg | ORAL_TABLET | ORAL | Status: DC | PRN

## 2015-11-02 MED ORDER — CHLORHEXIDINE GLUCONATE 0.12 % MT SOLN
15.0000 mL | OROMUCOSAL | Status: AC
  Administered 2015-11-02: 15 mL via OROMUCOSAL
  Filled 2015-11-02: qty 15

## 2015-11-02 MED ORDER — ACETAMINOPHEN 650 MG RE SUPP
650.0000 mg | Freq: Once | RECTAL | Status: AC
  Administered 2015-11-02: 650 mg via RECTAL

## 2015-11-02 MED ORDER — METOPROLOL TARTRATE 1 MG/ML IV SOLN
2.5000 mg | INTRAVENOUS | Status: DC | PRN
  Administered 2015-11-04: 2.5 mg via INTRAVENOUS
  Administered 2015-11-04: 5 mg via INTRAVENOUS
  Administered 2015-11-05: 2.5 mg via INTRAVENOUS
  Administered 2015-11-05: 5 mg via INTRAVENOUS
  Administered 2015-11-06 – 2015-11-07 (×3): 2.5 mg via INTRAVENOUS
  Filled 2015-11-02 (×7): qty 5

## 2015-11-02 MED ORDER — MUPIROCIN 2 % EX OINT
1.0000 "application " | TOPICAL_OINTMENT | Freq: Two times a day (BID) | CUTANEOUS | Status: AC
  Administered 2015-11-02 – 2015-11-06 (×10): 1 via NASAL
  Filled 2015-11-02 (×3): qty 22

## 2015-11-02 MED ORDER — PROTAMINE SULFATE 10 MG/ML IV SOLN
INTRAVENOUS | Status: DC | PRN
  Administered 2015-11-02: 140 mg via INTRAVENOUS

## 2015-11-02 MED ORDER — SODIUM CHLORIDE 0.9 % IV SOLN: INTRAVENOUS | Status: DC | PRN

## 2015-11-02 MED ORDER — GLYCOPYRROLATE 0.2 MG/ML IJ SOLN
INTRAMUSCULAR | Status: AC
  Filled 2015-11-02: qty 1

## 2015-11-02 MED ORDER — INSULIN ASPART 100 UNIT/ML ~~LOC~~ SOLN
0.0000 [IU] | SUBCUTANEOUS | Status: DC
Start: 2015-11-02 — End: 2015-11-05
  Administered 2015-11-03: 2 [IU] via SUBCUTANEOUS

## 2015-11-02 MED ORDER — DEXTROSE 5 % IV SOLN
0.0000 ug/min | INTRAVENOUS | Status: DC
  Filled 2015-11-02: qty 2

## 2015-11-02 MED ORDER — SODIUM CHLORIDE 0.9 % IV SOLN
INTRAVENOUS | Status: AC
  Administered 2015-11-02: 11:00:00 via INTRAVENOUS

## 2015-11-02 MED ORDER — HEPARIN SODIUM (PORCINE) 1000 UNIT/ML IJ SOLN
INTRAMUSCULAR | Status: AC
  Filled 2015-11-02: qty 2

## 2015-11-02 MED ORDER — SODIUM CHLORIDE 0.9 % IV SOLN
250.0000 [IU] | INTRAVENOUS | Status: DC | PRN
  Administered 2015-11-02: .6 [IU]/h via INTRAVENOUS

## 2015-11-02 MED ORDER — PROPOFOL 10 MG/ML IV BOLUS
INTRAVENOUS | Status: AC
  Filled 2015-11-02: qty 20

## 2015-11-02 MED ORDER — LACTATED RINGERS IV SOLN
INTRAVENOUS | Status: DC
  Administered 2015-11-02: 10 mL/h via INTRAVENOUS
  Administered 2015-11-02: 20 mL/h via INTRAVENOUS

## 2015-11-02 MED ORDER — HEPARIN SODIUM (PORCINE) 1000 UNIT/ML IJ SOLN
INTRAMUSCULAR | Status: DC | PRN
Start: 2015-11-02 — End: 2015-11-02
  Administered 2015-11-02: 14000 [IU] via INTRAVENOUS

## 2015-11-02 MED ORDER — CHLORHEXIDINE GLUCONATE 0.12% ORAL RINSE (MEDLINE KIT)
15.0000 mL | Freq: Two times a day (BID) | OROMUCOSAL | Status: DC
  Administered 2015-11-02: 15 mL via OROMUCOSAL

## 2015-11-02 MED ORDER — MORPHINE SULFATE (PF) 2 MG/ML IV SOLN
1.0000 mg | INTRAVENOUS | Status: AC | PRN
  Administered 2015-11-02 (×4): 2 mg via INTRAVENOUS

## 2015-11-02 MED ORDER — PROPOFOL 10 MG/ML IV BOLUS
INTRAVENOUS | Status: DC | PRN
  Administered 2015-11-02: 60 mg via INTRAVENOUS

## 2015-11-02 MED ORDER — MIDAZOLAM HCL 2 MG/2ML IJ SOLN
2.0000 mg | INTRAMUSCULAR | Status: DC | PRN
  Administered 2015-11-02 (×2): 2 mg via INTRAVENOUS
  Filled 2015-11-02 (×2): qty 2

## 2015-11-02 MED ORDER — CHLORHEXIDINE GLUCONATE CLOTH 2 % EX PADS
6.0000 | MEDICATED_PAD | Freq: Every day | CUTANEOUS | Status: AC
  Administered 2015-11-03 – 2015-11-07 (×5): 6 via TOPICAL

## 2015-11-02 MED ORDER — LACTATED RINGERS IV SOLN
INTRAVENOUS | Status: DC
  Administered 2015-11-02: 10 mL/h via INTRAVENOUS

## 2015-11-02 MED ORDER — ACETAMINOPHEN 160 MG/5ML PO SOLN: 650.0000 mg | Freq: Once | ORAL | Status: AC

## 2015-11-02 MED ORDER — ANTISEPTIC ORAL RINSE SOLUTION (CORINZ)
7.0000 mL | OROMUCOSAL | Status: DC
  Administered 2015-11-02 (×2): 7 mL via OROMUCOSAL

## 2015-11-02 MED ORDER — GLYCOPYRROLATE 0.2 MG/ML IJ SOLN
INTRAMUSCULAR | Status: DC | PRN
  Administered 2015-11-02: 0.2 mg via INTRAVENOUS

## 2015-11-02 MED ORDER — PHENYLEPHRINE HCL 10 MG/ML IJ SOLN
20.0000 mg | INTRAMUSCULAR | Status: DC | PRN
  Administered 2015-11-02: 40 ug/min via INTRAVENOUS

## 2015-11-02 MED ORDER — CHLORHEXIDINE GLUCONATE 4 % EX LIQD
CUTANEOUS | Status: AC
  Administered 2015-11-02: 03:00:00
  Filled 2015-11-02: qty 15

## 2015-11-02 MED ORDER — ARTIFICIAL TEARS OP OINT
TOPICAL_OINTMENT | OPHTHALMIC | Status: DC | PRN
  Administered 2015-11-02: 1 via OPHTHALMIC

## 2015-11-02 MED ORDER — BISACODYL 10 MG RE SUPP: 10.0000 mg | Freq: Every day | RECTAL | Status: DC

## 2015-11-02 MED ORDER — MORPHINE SULFATE (PF) 2 MG/ML IV SOLN
2.0000 mg | INTRAVENOUS | Status: DC | PRN
  Administered 2015-11-02 – 2015-11-03 (×4): 2 mg via INTRAVENOUS
  Filled 2015-11-02 (×3): qty 1
  Filled 2015-11-02: qty 2
  Filled 2015-11-02 (×3): qty 1

## 2015-11-02 MED ORDER — ANTISEPTIC ORAL RINSE SOLUTION (CORINZ)
7.0000 mL | Freq: Four times a day (QID) | OROMUCOSAL | Status: DC
  Administered 2015-11-03 – 2015-11-07 (×9): 7 mL via OROMUCOSAL

## 2015-11-02 MED ORDER — ONDANSETRON HCL 4 MG/2ML IJ SOLN
4.0000 mg | Freq: Four times a day (QID) | INTRAMUSCULAR | Status: DC | PRN
  Administered 2015-11-03 – 2015-11-04 (×4): 4 mg via INTRAVENOUS
  Filled 2015-11-02 (×4): qty 2

## 2015-11-02 MED ORDER — SODIUM CHLORIDE 0.9 % IV SOLN: 250.0000 mL | INTRAVENOUS | Status: DC

## 2015-11-02 MED ORDER — PROTAMINE SULFATE 10 MG/ML IV SOLN
INTRAVENOUS | Status: AC
  Filled 2015-11-02: qty 25

## 2015-11-02 MED ORDER — VECURONIUM BROMIDE 10 MG IV SOLR
INTRAVENOUS | Status: AC
  Filled 2015-11-02: qty 10

## 2015-11-02 MED ORDER — MIDAZOLAM HCL 5 MG/5ML IJ SOLN
INTRAMUSCULAR | Status: DC | PRN
  Administered 2015-11-02: 1 mg via INTRAVENOUS
  Administered 2015-11-02: 2 mg via INTRAVENOUS
  Administered 2015-11-02: 3 mg via INTRAVENOUS

## 2015-11-02 MED ORDER — MIDAZOLAM HCL 10 MG/2ML IJ SOLN
INTRAMUSCULAR | Status: AC
  Filled 2015-11-02: qty 2

## 2015-11-02 MED ORDER — SODIUM CHLORIDE 0.9 % IJ SOLN
OROMUCOSAL | Status: DC | PRN
  Administered 2015-11-02 (×3): 4 mL via TOPICAL

## 2015-11-02 MED ORDER — SODIUM CHLORIDE 0.9 % IV SOLN
INTRAVENOUS | Status: DC
  Administered 2015-11-02 (×3): 20 mL/h via INTRAVENOUS

## 2015-11-02 MED ORDER — ASPIRIN EC 325 MG PO TBEC: 325.0000 mg | DELAYED_RELEASE_TABLET | Freq: Every day | ORAL | Status: DC

## 2015-11-02 MED ORDER — ASPIRIN 81 MG PO CHEW: 324.0000 mg | CHEWABLE_TABLET | Freq: Every day | ORAL | Status: DC

## 2015-11-02 MED ORDER — SODIUM CHLORIDE 0.9 % IV SOLN
INTRAVENOUS | Status: DC
  Filled 2015-11-02: qty 2.5

## 2015-11-02 MED ORDER — SODIUM CHLORIDE 0.9 % IJ SOLN: 3.0000 mL | INTRAMUSCULAR | Status: DC | PRN

## 2015-11-02 MED ORDER — ALBUMIN HUMAN 5 % IV SOLN
250.0000 mL | INTRAVENOUS | Status: AC | PRN
  Administered 2015-11-02 – 2015-11-03 (×4): 250 mL via INTRAVENOUS
  Filled 2015-11-02 (×2): qty 250

## 2015-11-02 MED ORDER — ARTIFICIAL TEARS OP OINT
TOPICAL_OINTMENT | OPHTHALMIC | Status: AC
  Filled 2015-11-02: qty 3.5

## 2015-11-02 MED FILL — Heparin Sodium (Porcine) Inj 1000 Unit/ML: INTRAMUSCULAR | Qty: 30 | Status: AC

## 2015-11-02 MED FILL — Potassium Chloride Inj 2 mEq/ML: INTRAVENOUS | Qty: 40 | Status: AC

## 2015-11-02 MED FILL — Magnesium Sulfate Inj 50%: INTRAMUSCULAR | Qty: 10 | Status: AC

## 2015-11-02 SURGICAL SUPPLY — 112 items
BAG DECANTER FOR FLEXI CONT (MISCELLANEOUS) ×6 IMPLANT
BANDAGE ELASTIC 4 VELCRO ST LF (GAUZE/BANDAGES/DRESSINGS) ×1 IMPLANT
BANDAGE ELASTIC 6 VELCRO ST LF (GAUZE/BANDAGES/DRESSINGS) ×2 IMPLANT
BASKET HEART (ORDER IN 25'S) (MISCELLANEOUS) ×1
BASKET HEART (ORDER IN 25S) (MISCELLANEOUS) ×2 IMPLANT
BLADE STERNUM SYSTEM 6 (BLADE) ×3 IMPLANT
BLADE SURG ROTATE 9660 (MISCELLANEOUS) IMPLANT
BLOWER MISTER CAL-MED (MISCELLANEOUS) ×2 IMPLANT
BNDG GAUZE ELAST 4 BULKY (GAUZE/BANDAGES/DRESSINGS) ×1 IMPLANT
CANISTER SUCTION 2500CC (MISCELLANEOUS) ×3 IMPLANT
CANNULA EZ GLIDE AORTIC 21FR (CANNULA) ×4 IMPLANT
CATH CPB KIT OWEN (MISCELLANEOUS) ×1 IMPLANT
CATH THORACIC 36FR (CATHETERS) ×3 IMPLANT
CLIP TI MEDIUM 24 (CLIP) IMPLANT
CLIP TI WIDE RED SMALL 24 (CLIP) IMPLANT
CONN ST 1/4X3/8  BEN (MISCELLANEOUS) ×2
CONN ST 1/4X3/8 BEN (MISCELLANEOUS) ×2 IMPLANT
CRADLE DONUT ADULT HEAD (MISCELLANEOUS) ×3 IMPLANT
DRAIN CHANNEL 32F RND 10.7 FF (WOUND CARE) ×6 IMPLANT
DRAPE CARDIOVASCULAR INCISE (DRAPES) ×3
DRAPE INCISE IOBAN 66X45 STRL (DRAPES) ×3 IMPLANT
DRAPE SLUSH/WARMER DISC (DRAPES) ×3 IMPLANT
DRAPE SRG 135X102X78XABS (DRAPES) ×2 IMPLANT
DRSG COVADERM 4X14 (GAUZE/BANDAGES/DRESSINGS) ×3 IMPLANT
ELECT BLADE 4.0 EZ CLEAN MEGAD (MISCELLANEOUS) ×3
ELECT REM PT RETURN 9FT ADLT (ELECTROSURGICAL) ×6
ELECTRODE BLDE 4.0 EZ CLN MEGD (MISCELLANEOUS) ×1 IMPLANT
ELECTRODE REM PT RTRN 9FT ADLT (ELECTROSURGICAL) ×4 IMPLANT
GAUZE SPONGE 4X4 12PLY STRL (GAUZE/BANDAGES/DRESSINGS) ×5 IMPLANT
GLOVE BIO SURGEON STRL SZ 6 (GLOVE) ×1 IMPLANT
GLOVE BIO SURGEON STRL SZ 6.5 (GLOVE) ×1 IMPLANT
GLOVE BIO SURGEON STRL SZ7 (GLOVE) ×4 IMPLANT
GLOVE BIOGEL PI IND STRL 6.5 (GLOVE) IMPLANT
GLOVE BIOGEL PI IND STRL 7.0 (GLOVE) ×1 IMPLANT
GLOVE BIOGEL PI INDICATOR 6.5 (GLOVE) ×4
GLOVE BIOGEL PI INDICATOR 7.0 (GLOVE) ×1
GLOVE ORTHO TXT STRL SZ7.5 (GLOVE) ×6 IMPLANT
GOWN STRL REUS W/ TWL LRG LVL3 (GOWN DISPOSABLE) ×8 IMPLANT
GOWN STRL REUS W/TWL LRG LVL3 (GOWN DISPOSABLE) ×18
HEMOSTAT POWDER SURGIFOAM 1G (HEMOSTASIS) ×9 IMPLANT
INSERT FOGARTY XLG (MISCELLANEOUS) ×3 IMPLANT
KIT BASIN OR (CUSTOM PROCEDURE TRAY) ×3 IMPLANT
KIT ROOM TURNOVER OR (KITS) ×3 IMPLANT
KIT SUCTION CATH 14FR (SUCTIONS) ×9 IMPLANT
KIT VASOVIEW W/TROCAR VH 2000 (KITS) ×2 IMPLANT
LEAD PACING MYOCARDI (MISCELLANEOUS) ×3 IMPLANT
LOOP VESSEL SUPERMAXI WHITE (MISCELLANEOUS) IMPLANT
MARKER GRAFT CORONARY BYPASS (MISCELLANEOUS) ×9 IMPLANT
NS IRRIG 1000ML POUR BTL (IV SOLUTION) ×17 IMPLANT
PACK OPEN HEART (CUSTOM PROCEDURE TRAY) ×3 IMPLANT
PAD ARMBOARD 7.5X6 YLW CONV (MISCELLANEOUS) ×6 IMPLANT
PAD ELECT DEFIB RADIOL ZOLL (MISCELLANEOUS) ×3 IMPLANT
PENCIL BUTTON HOLSTER BLD 10FT (ELECTRODE) ×3 IMPLANT
PUNCH AORTIC ROTATE 4.0MM (MISCELLANEOUS) IMPLANT
PUNCH AORTIC ROTATE 4.5MM 8IN (MISCELLANEOUS) IMPLANT
PUNCH AORTIC ROTATE 5MM 8IN (MISCELLANEOUS) IMPLANT
SOLUTION ANTI FOG 6CC (MISCELLANEOUS) IMPLANT
SPONGE LAP 18X18 X RAY DECT (DISPOSABLE) IMPLANT
SPONGE LAP 4X18 X RAY DECT (DISPOSABLE) IMPLANT
SUT BONE WAX W31G (SUTURE) ×3 IMPLANT
SUT ETHIBOND 2 0 SH (SUTURE) ×12
SUT ETHIBOND 2 0 SH 36X2 (SUTURE) ×4 IMPLANT
SUT ETHIBOND X763 2 0 SH 1 (SUTURE) ×6 IMPLANT
SUT MNCRL AB 3-0 PS2 18 (SUTURE) ×6 IMPLANT
SUT MNCRL AB 4-0 PS2 18 (SUTURE) IMPLANT
SUT PDS AB 1 CTX 36 (SUTURE) ×6 IMPLANT
SUT PROLENE 2 0 SH DA (SUTURE) IMPLANT
SUT PROLENE 3 0 SH DA (SUTURE) ×3 IMPLANT
SUT PROLENE 3 0 SH1 36 (SUTURE) IMPLANT
SUT PROLENE 4 0 RB 1 (SUTURE) ×3
SUT PROLENE 4 0 SH DA (SUTURE) IMPLANT
SUT PROLENE 4-0 RB1 .5 CRCL 36 (SUTURE) IMPLANT
SUT PROLENE 5 0 C 1 36 (SUTURE) ×2 IMPLANT
SUT PROLENE 6 0 C 1 30 (SUTURE) ×4 IMPLANT
SUT PROLENE 7.0 RB 3 (SUTURE) ×9 IMPLANT
SUT PROLENE 8 0 BV175 6 (SUTURE) ×2 IMPLANT
SUT PROLENE BLUE 7 0 (SUTURE) ×3 IMPLANT
SUT PROLENE POLY MONO (SUTURE) IMPLANT
SUT SILK  1 MH (SUTURE) ×5
SUT SILK 1 MH (SUTURE) ×6 IMPLANT
SUT SILK 1 TIES 10X30 (SUTURE) ×2 IMPLANT
SUT SILK 2 0 SH CR/8 (SUTURE) ×4 IMPLANT
SUT SILK 2 0 TIES 10X30 (SUTURE) ×1 IMPLANT
SUT SILK 2 0 TIES 17X18 (SUTURE) ×3
SUT SILK 2-0 18XBRD TIE BLK (SUTURE) IMPLANT
SUT SILK 3 0 SH CR/8 (SUTURE) ×1 IMPLANT
SUT SILK 4 0 TIE 10X30 (SUTURE) ×2 IMPLANT
SUT STEEL 6MS V (SUTURE) IMPLANT
SUT STEEL STERNAL CCS#1 18IN (SUTURE) IMPLANT
SUT STEEL SZ 6 DBL 3X14 BALL (SUTURE) IMPLANT
SUT TEM PAC WIRE 2 0 SH (SUTURE) ×8 IMPLANT
SUT VIC AB 1 CTX 36 (SUTURE)
SUT VIC AB 1 CTX36XBRD ANBCTR (SUTURE) IMPLANT
SUT VIC AB 2-0 CT1 27 (SUTURE)
SUT VIC AB 2-0 CT1 TAPERPNT 27 (SUTURE) IMPLANT
SUT VIC AB 2-0 CTX 27 (SUTURE) ×3 IMPLANT
SUT VIC AB 3-0 SH 27 (SUTURE)
SUT VIC AB 3-0 SH 27X BRD (SUTURE) IMPLANT
SUT VIC AB 3-0 X1 27 (SUTURE) ×2 IMPLANT
SUT VICRYL 4-0 PS2 18IN ABS (SUTURE) IMPLANT
SUTURE E-PAK OPEN HEART (SUTURE) ×1 IMPLANT
SYS GUIDANT ACHIEVE OFF PUMP (MISCELLANEOUS) ×2 IMPLANT
SYSTEM SAHARA CHEST DRAIN ATS (WOUND CARE) ×3 IMPLANT
TAPE CLOTH SURG 4X10 WHT LF (GAUZE/BANDAGES/DRESSINGS) ×2 IMPLANT
TAPES RETRACTO (MISCELLANEOUS) ×2 IMPLANT
TOWEL OR 17X24 6PK STRL BLUE (TOWEL DISPOSABLE) ×6 IMPLANT
TOWEL OR 17X26 10 PK STRL BLUE (TOWEL DISPOSABLE) ×6 IMPLANT
TRAY FOLEY IC TEMP SENS 16FR (CATHETERS) ×3 IMPLANT
TUBE SUCTION CARDIAC 10FR (CANNULA) ×2 IMPLANT
TUBING INSUFFLATION (TUBING) ×3 IMPLANT
UNDERPAD 30X30 INCONTINENT (UNDERPADS AND DIAPERS) ×3 IMPLANT
WATER STERILE IRR 1000ML POUR (IV SOLUTION) ×6 IMPLANT

## 2015-11-02 NOTE — Anesthesia Procedure Notes (Addendum)
Central Venous Catheter Insertion Performed by: anesthesiologist Patient location: Pre-op. Preanesthetic checklist: patient identified, IV checked, site marked, risks and benefits discussed, surgical consent, monitors and equipment checked, pre-op evaluation, timeout performed and anesthesia consent Lidocaine 1% used for infiltration Landmarks identified Catheter size: 9 Fr PA cath was placed.MAC introducer Swan type and PA catheter depth:thermodilation and 42PA Cath depth:42 Procedure performed using ultrasound guided technique. Attempts: 1 Following insertion, line sutured and dressing applied. Post procedure assessment: blood return through all ports, free fluid flow and no air. Patient tolerated the procedure well with no immediate complications.   Procedure Name: Intubation Date/Time: 11/02/2015 8:02 AM Performed by: Melina Copa, Ellin Fitzgibbons R Pre-anesthesia Checklist: Patient identified, Emergency Drugs available, Suction available, Patient being monitored and Timeout performed Patient Re-evaluated:Patient Re-evaluated prior to inductionOxygen Delivery Method: Circle system utilized Preoxygenation: Pre-oxygenation with 100% oxygen Intubation Type: IV induction Ventilation: Mask ventilation without difficulty Laryngoscope Size: Mac and 3 Grade View: Grade II Tube type: Oral Tube size: 8.5 mm Number of attempts: 1 Airway Equipment and Method: Stylet Placement Confirmation: ETT inserted through vocal cords under direct vision and positive ETCO2 Secured at: 21 cm Tube secured with: Tape Dental Injury: Teeth and Oropharynx as per pre-operative assessment

## 2015-11-02 NOTE — Progress Notes (Signed)
Dr. Servando Snare paged regarding positive MRSA/STAPH surgical PCR. New orders received for Bactroban ointment. RN will follow orders and cont to monitor.   Arnell Sieving, RN

## 2015-11-02 NOTE — Progress Notes (Signed)
      [address]       [place] [address]             [phone]      S/p CABG x 1  Waking up  BP 109/67 mmHg  Pulse 56  Temp(Src) 97.7 F (36.5 C) (Oral)  Resp 11  Ht 5\' 4"  (1.626 m)  Wt 157 lb 10.1 oz (71.5 kg)  BMI 27.04 kg/m2  SpO2 96%  LMP 03/21/1971   Intake/Output Summary (Last 24 hours) at 11/02/15 1710 Last data filed at 11/02/15 1600  Gross per 24 hour  Intake 3914.62 ml  Output   1325 ml  Net 2589.62 ml    Awaiting extubation ABG  Remo Lipps C. Roxan Hockey, MD [facility] [phone]

## 2015-11-02 NOTE — OR Nursing (Signed)
First call to SICU charge nurse at [phone].

## 2015-11-02 NOTE — Brief Op Note (Addendum)
10/29/2015 - 11/02/2015  9:52 AM  PATIENT:  Emily Wagner  81 y.o. female  PRE-OPERATIVE DIAGNOSIS:  CAD  POST-OPERATIVE DIAGNOSIS:  CAD  PROCEDURE:   OFF PUMP CORONARY ARTERY BYPASS GRAFTING x 1 (LIMA-LAD)  SURGEON:    Rexene Alberts, MD  ASSISTANTS:  Suzzanne Cloud, PA-C  ANESTHESIA:   Roberts Gaudy, MD  FINDINGS:  Normal LV function  Good quality LIMA conduit for grafting  Good quality target vessel for grafting  COMPLICATIONS: None  BASELINE WEIGHT: 72 kg  PATIENT DISPOSITION:   TO SICU IN STABLE CONDITION  Rexene Alberts, MD 11/02/2015 10:24 AM

## 2015-11-02 NOTE — Procedures (Signed)
Arterial Catheter Insertion Procedure Note Kizmet Endlich [id number] Apr 20, 1933  Procedure: Insertion of Arterial Catheter  Indications: Frequent blood sampling  Procedure Details Consent: Unable to obtain consent because of altered level of consciousness. Time Out: Verified patient identification, verified procedure, site/side was marked, verified correct patient position, special equipment/implants available, medications/allergies/relevent history reviewed, required imaging and test results available.  Performed  Maximum sterile technique was used including antiseptics, cap, gloves, gown, hand hygiene, mask, sheet and arrow. Skin prep: Chlorhexidine; local anesthetic administered 20 gauge catheter was inserted into right radial artery using the Seldinger technique.  Evaluation Blood flow good; BP tracing good. Complications: No apparent complications.   Latysha Thackston M 11/02/2015

## 2015-11-02 NOTE — Procedures (Signed)
Extubation Procedure Note  Patient Details:   Name: Emily Wagner DOB: 1933/08/22 MRN: [id number]   Airway Documentation:     Evaluation  O2 sats: stable throughout Complications: No apparent complications Patient did tolerate procedure well. Bilateral Breath Sounds: Clear, Diminished   Yes   Pt tolerated wean, positive for cuff leak, extubated to 5L [name]. No dyspnea or stridor noted after extubation. RT will continue to monitor.   Mariam Dollar 11/02/2015, 5:46 PM

## 2015-11-02 NOTE — Op Note (Addendum)
CARDIOTHORACIC SURGERY OPERATIVE NOTE  Date of Procedure:  11/02/2015  Preoperative Diagnosis: Severe Multi-vessel Coronary Artery Disease  Postoperative Diagnosis: Same  Procedure:    Off-pump Coronary Artery Bypass Grafting x 1   Left Internal Mammary Artery to Distal Left Anterior Descending Coronary Artery  Surgeon: Valentina Gu. Roxy Manns, MD  Assistant: Arlyn Leak. Theda Sers, PA-C  Anesthesia: Roberts Gaudy, MD  Operative Findings:  Normal LV function  Good quality LIMA conduit for grafting  Good quality target vessel for grafting     BRIEF CLINICAL NOTE AND INDICATIONS FOR SURGERY  Patient is an 81 year old female with known history of coronary artery disease, hypertension, and hyperlipidemia who presents with symptoms consistent with unstable angina and has been referred for surgical consultation to discuss treatment options for management of coronary artery disease. The patient has been followed by Dr. Percival Spanish for several years. Cardiac catheterization performed in 2008 revealed 60% stenosis of the left anterior descending coronary artery with 70% stenosis of the first diagonal branch. She has been managed medically. The last few months the patient has complained of worsening symptoms of exertional fatigue and intermittent chest discomfort. Her primary symptom initially was that of fatigue. She states that she would intermittently feel mild vague discomfort in her chest and sweaty. A few weeks ago she was walking in a guard and pick some turnip greens at a friend's vegetable patch when she developed severe chest discomfort and shortness of breath. It took several minutes to recover. She had a similar episode of chest discomfort on Thanksgiving day and was evaluated in the [facility] emergency department. EKG was without ischemic changes and troponins were negative. She was evaluated in the office by Dr. Percival Spanish yesterday, after which time she was directly admitted to the hospital for  diagnostic cardiac catheterization. Catheterization performed by Dr. Tamala Julian demonstrates high-grade proximal stenosis of the left anterior descending coronary artery with anatomical characteristics that are felt to be relatively unfavorable for PCI and stenting. Cardiothoracic surgical consultation was requested. The patient has been seen in consultation and counseled at length regarding the indications, risks and potential benefits of surgery.  All questions have been answered, and the patient provides full informed consent for the operation as described.    DETAILS OF THE OPERATIVE PROCEDURE  Preparation:  The patient is brought to the operating room on the above mentioned date and central monitoring was established by the anesthesia team including placement of Swan-Ganz catheter and radial arterial line. The patient is placed in the supine position on the operating table.  Intravenous antibiotics are administered. General endotracheal anesthesia is induced uneventfully. A Foley catheter is placed.  Baseline transesophageal echocardiogram was performed.  Findings were notable for normal LV function.  The patient's chest, abdomen, both groins, and both lower extremities are prepared and draped in a sterile manner. A time out procedure is performed.   Surgical Approach and Harvest of Conduit:  A median sternotomy incision was performed and the left internal mammary artery is dissected from the chest wall and prepared for bypass grafting. The left internal mammary artery is notably good quality conduit.  Following systemic heparinization, the left internal mammary artery was transected distally noted to have excellent flow.  The pericardium is opened. The ascending aorta is normal in appearance.  The [employer] off-pump cardiac stabilization system is utilized to facilitate off-pump coronary revascularization.  Both the apical suction cup and the U-shaped stabilization arm are utilized.  Elastic  vessel loops are used for proximal and distal hemostatic control.  Intracoronary shunts are not utilized.   Off-pump Coronary Artery Bypass Grafting:  The distal left anterior coronary artery was grafted with the left internal mammary artery in an end-to-side fashion.  At the site of distal anastomosis the target vessel was good quality and measured approximately 1.5 mm in diameter.  The distal coronary anastomosis was inspected for hemostasis and appropriate graft orientation.    Procedure Completion:  Epicardial pacing wires are fixed to the right atrial appendage.  Followup transesophageal echocardiogram performed after completion of all grafts revealed no changes from the preoperative exam.  Protamine was administered to reverse the anticoagulation.   The mediastinum and pleural space were inspected for hemostasis and irrigated with saline solution. The mediastinum and the left pleural space were drained using 3 chest tubes placed through separate stab incisions inferiorly.  The soft tissues anterior to the aorta were reapproximated loosely. The sternum is closed with double strength sternal wire. The soft tissues anterior to the sternum were closed in multiple layers and the skin is closed with a running subcuticular skin closure.  No blood products were administered during the operation.   Patient Disposition:  The patient tolerated the procedure well and is transported to the surgical intensive care in stable condition. There are no intraoperative complications. All sponge instrument and needle counts are verified correct at completion of the operation.    Valentina Gu. Roxy Manns MD 11/02/2015 10:28 AM

## 2015-11-02 NOTE — OR Nursing (Signed)
Second call to SICU charge nurse at [phone].

## 2015-11-02 NOTE — Anesthesia Preprocedure Evaluation (Addendum)
Anesthesia Evaluation  Patient identified by MRN, date of birth, ID band Patient awake    Reviewed: Allergy & Precautions, NPO status , Patient's Chart, lab work & pertinent test results, reviewed documented beta blocker date and time   Airway Mallampati: II  TM Distance: >3 FB Neck ROM: Full    Dental  (+) Teeth Intact, Dental Advisory Given   Pulmonary    breath sounds clear to auscultation       Cardiovascular hypertension, Pt. on medications + angina with exertion + CAD   Rhythm:Regular Rate:Normal     Neuro/Psych    GI/Hepatic   Endo/Other    Renal/GU      Musculoskeletal   Abdominal   Peds  Hematology   Anesthesia Other Findings   Reproductive/Obstetrics                            Anesthesia Physical Anesthesia Plan  ASA: III  Anesthesia Plan: General   Post-op Pain Management:    Induction: Intravenous  Airway Management Planned: Oral ETT  Additional Equipment: Arterial line, CVP, PA Cath, TEE and Ultrasound Guidance Line Placement  Intra-op Plan:   Post-operative Plan: Possible Post-op intubation/ventilation  Informed Consent: I have reviewed the patients History and Physical, chart, labs and discussed the procedure including the risks, benefits and alternatives for the proposed anesthesia with the patient or authorized representative who has indicated his/her understanding and acceptance.   Dental advisory given  Plan Discussed with: CRNA and Anesthesiologist  Anesthesia Plan Comments:         Anesthesia Quick Evaluation

## 2015-11-02 NOTE — Transfer of Care (Signed)
Immediate Anesthesia Transfer of Care Note  Patient: Emily Wagner  Procedure(s) Performed: Procedure(s) with comments: OFF PUMP CORONARY ARTERY BYPASS GRAFTING (CABG) TIMES ONE (N/A) - LIMA to LAD TRANSESOPHAGEAL ECHOCARDIOGRAM (TEE) (N/A)  Patient Location: ICU  Anesthesia Type:General  Level of Consciousness: unresponsive and Patient remains intubated per anesthesia plan  Airway & Oxygen Therapy: Patient remains intubated per anesthesia plan and Patient placed on Ventilator (see vital sign flow sheet for setting)  Post-op Assessment: Report given to RN and Post -op Vital signs reviewed and stable  Post vital signs: Reviewed and stable  Last Vitals:  Filed Vitals:   11/01/15 1352 11/01/15 2057  BP: 153/77 143/75  Pulse: 74 70  Temp:  35.9 C  Resp: 20 20    Complications: No apparent anesthesia complications

## 2015-11-02 NOTE — CV Procedure (Signed)
Intra-operative Transesophageal Echocardiography Note:  Emily Wagner is an 81 year old female with a history of known coronary artery disease which had been managed medically by Dr. Percival Spanish  since 2008. She recently presented with symptoms consistent with unstable angina and underwent cardiac catheterization on 10/29/2015. This  revealed 90% proximal to mid LAD stenosis, 75% lesions in the first diagonal, second diagonal, and 60% stenosis in the right coronary artery. There was normal left ventricular systolic function. It was felt that her symptoms were due to the LAD lesion. She is now scheduled to undergo off-pump CABG with LIMA to LAD grafting. Intraoperative transesophageal echocardiography was indicated to evaluate the left and right ventricular function, to serve as a  monitor for intraoperative ischemia and volume status, and to assess for any valvular heart disease.   The patient was brought to the operating room at [facility] and general anesthesia was induced without difficulty.  Following endotracheal intubation and orogastric suctioning, the transesophageal echocardiography probe was inserted into the esophagus without difficulty.  Impression:  1. Aortic valve: The aortic leaflets were mildly thickened but opened normally without restriction. There was no aortic insufficiency.  2. Mitral valve: The mitral leaflets appeared to open normally and coapt normally. There was trace mitral insufficiency. There was no evidence of prolapsing or flail mitral leaflet segments noted.  3. Left ventricle: There was normal left ventricular systolic function. There was good contractility in all segments interrogated and the ejection fraction was estimated at 60-65%. The LV was of normal size and measured 3.57 cm at end diastole at the mid papillary level in the transgastric short axis view. The posterior wall measured 0.92 cm and the anterior wall measured 0.83 cm at end diastole at the mid  papillary level. There were no regional wall motion abnormalities.   4. Right ventricle: The right ventricular cavity was of normal size. There was good contractility of the right ventricular free wall and normal-appearing right ventricular function.  5. Tricuspid valve: The tricuspid valve appeared structurally normal with trace tricuspid insufficiency.  6. Interatrial septum: The interatrial septum was intact without evidence of patent foramen ovale or atrial septal defect by color Doppler and bubble study.  7. Left atrium: There was no thrombus noted within the left atrial cavity or left atrial appendage.  8. Ascending aorta: There was a well-defined aortic root and sino-tubular ridge without effacement or aneurysmal enlargement. There was moderate calcification noted within the wall of the ascending aorta but no protruding atheromata appreciated.  9. Descending aorta: There was scattered grade 1-2 atheromatous disease noted within the wall of the descending aorta. The descending aorta measured 3.0 cm in diameter.  Roberts Gaudy, M.D.

## 2015-11-02 NOTE — Anesthesia Postprocedure Evaluation (Signed)
Anesthesia Post Note  Patient: Emily Wagner  Procedure(s) Performed: Procedure(s) (LRB): OFF PUMP CORONARY ARTERY BYPASS GRAFTING (CABG) TIMES ONE (N/A) TRANSESOPHAGEAL ECHOCARDIOGRAM (TEE) (N/A)  Patient location during evaluation: SICU Anesthesia Type: General Level of consciousness: patient remains intubated per anesthesia plan Pain management: pain level controlled Vital Signs Assessment: post-procedure vital signs reviewed and stable Respiratory status: patient on ventilator - see flowsheet for VS Anesthetic complications: no    Last Vitals:  Filed Vitals:   11/02/15 1300 11/02/15 1315  BP:  113/70  Pulse: 63 65  Temp: 36 C 36 C  Resp: 16 14    Last Pain:  Filed Vitals:   11/02/15 1323  PainSc: Asleep                 Jerome Viglione COKER

## 2015-11-02 NOTE — Progress Notes (Addendum)
During extubation wean pts arterial line clotted and unable to get an accurate blood gas. Dr. Roxan Hockey saw blood gas results and wanted to continue the extubation process. A venous gas was also obtained to compare the results. Pt completed weaning process and was extubated at 1745. Pt tolerated well sating 98 on 5L. Will continue to monitor closely.

## 2015-11-03 ENCOUNTER — Encounter (HOSPITAL_COMMUNITY): Payer: Self-pay | Admitting: Thoracic Surgery (Cardiothoracic Vascular Surgery)

## 2015-11-03 ENCOUNTER — Inpatient Hospital Stay (HOSPITAL_COMMUNITY): Payer: Medicare Other

## 2015-11-03 DIAGNOSIS — E785 Hyperlipidemia, unspecified: Secondary | ICD-10-CM

## 2015-11-03 DIAGNOSIS — I25119 Atherosclerotic heart disease of native coronary artery with unspecified angina pectoris: Secondary | ICD-10-CM

## 2015-11-03 LAB — GLUCOSE, CAPILLARY
GLUCOSE-CAPILLARY: 104 mg/dL — AB (ref 65–99)
GLUCOSE-CAPILLARY: 102 mg/dL — AB (ref 65–99)
GLUCOSE-CAPILLARY: 133 mg/dL — AB (ref 65–99)
GLUCOSE-CAPILLARY: 87 mg/dL (ref 65–99)
Glucose-Capillary: 90 mg/dL (ref 65–99)
Glucose-Capillary: 95 mg/dL (ref 65–99)
Glucose-Capillary: 96 mg/dL (ref 65–99)
Glucose-Capillary: 96 mg/dL (ref 65–99)

## 2015-11-03 LAB — BASIC METABOLIC PANEL
ANION GAP: 5 (ref 5–15)
BUN: 14 mg/dL (ref 6–20)
CALCIUM: 7.7 mg/dL — AB (ref 8.9–10.3)
CO2: 21 mmol/L — AB (ref 22–32)
Chloride: 112 mmol/L — ABNORMAL HIGH (ref 101–111)
Creatinine, Ser: 0.87 mg/dL (ref 0.44–1.00)
Glucose, Bld: 110 mg/dL — ABNORMAL HIGH (ref 65–99)
Potassium: 3.9 mmol/L (ref 3.5–5.1)
Sodium: 138 mmol/L (ref 135–145)

## 2015-11-03 LAB — CBC
HEMATOCRIT: 34.7 % — AB (ref 36.0–46.0)
Hemoglobin: 11 g/dL — ABNORMAL LOW (ref 12.0–15.0)
MCH: 30.6 pg (ref 26.0–34.0)
MCHC: 31.7 g/dL (ref 30.0–36.0)
MCV: 96.7 fL (ref 78.0–100.0)
Platelets: 117 10*3/uL — ABNORMAL LOW (ref 150–400)
RBC: 3.59 MIL/uL — AB (ref 3.87–5.11)
RDW: 13.5 % (ref 11.5–15.5)
WBC: 7.9 10*3/uL (ref 4.0–10.5)

## 2015-11-03 LAB — POCT I-STAT, CHEM 8
BUN: 14 mg/dL (ref 6–20)
CHLORIDE: 106 mmol/L (ref 101–111)
CREATININE: 0.8 mg/dL (ref 0.44–1.00)
Calcium, Ion: 1.12 mmol/L — ABNORMAL LOW (ref 1.13–1.30)
Glucose, Bld: 132 mg/dL — ABNORMAL HIGH (ref 65–99)
HEMATOCRIT: 34 % — AB (ref 36.0–46.0)
Hemoglobin: 11.6 g/dL — ABNORMAL LOW (ref 12.0–15.0)
POTASSIUM: 3.9 mmol/L (ref 3.5–5.1)
Sodium: 143 mmol/L (ref 135–145)
TCO2: 24 mmol/L (ref 0–100)

## 2015-11-03 LAB — MAGNESIUM: Magnesium: 2.2 mg/dL (ref 1.7–2.4)

## 2015-11-03 MED ORDER — DIPHENHYDRAMINE HCL 50 MG/ML IJ SOLN
12.5000 mg | Freq: Four times a day (QID) | INTRAMUSCULAR | Status: DC | PRN
  Administered 2015-11-03 – 2015-11-04 (×2): 12.5 mg via INTRAVENOUS
  Filled 2015-11-03 (×2): qty 1

## 2015-11-03 MED ORDER — SODIUM CHLORIDE 0.9 % IV SOLN
250.0000 mL | INTRAVENOUS | Status: DC | PRN
  Administered 2015-11-03: 250 mL via INTRAVENOUS

## 2015-11-03 MED ORDER — MOVING RIGHT ALONG BOOK
Freq: Once | Status: AC
  Administered 2015-11-03: 1
  Filled 2015-11-03: qty 1

## 2015-11-03 MED ORDER — SODIUM CHLORIDE 0.9 % IJ SOLN
3.0000 mL | Freq: Two times a day (BID) | INTRAMUSCULAR | Status: DC
  Administered 2015-11-03 – 2015-11-07 (×6): 3 mL via INTRAVENOUS

## 2015-11-03 MED ORDER — FUROSEMIDE 10 MG/ML IJ SOLN
INTRAMUSCULAR | Status: AC
  Administered 2015-11-03: 20 mg via INTRAVENOUS
  Filled 2015-11-03: qty 2

## 2015-11-03 MED ORDER — FUROSEMIDE 10 MG/ML IJ SOLN
20.0000 mg | Freq: Once | INTRAMUSCULAR | Status: AC
  Administered 2015-11-03: 20 mg via INTRAVENOUS

## 2015-11-03 MED ORDER — ENOXAPARIN SODIUM 30 MG/0.3ML ~~LOC~~ SOLN
30.0000 mg | Freq: Every day | SUBCUTANEOUS | Status: DC
  Administered 2015-11-04 – 2015-11-07 (×4): 30 mg via SUBCUTANEOUS
  Filled 2015-11-03 (×4): qty 0.3

## 2015-11-03 MED ORDER — SODIUM CHLORIDE 0.9 % IJ SOLN: 3.0000 mL | INTRAMUSCULAR | Status: DC | PRN

## 2015-11-03 MED ORDER — MORPHINE SULFATE (PF) 2 MG/ML IV SOLN
1.0000 mg | INTRAVENOUS | Status: DC | PRN
  Administered 2015-11-03 (×2): 1 mg via INTRAVENOUS
  Filled 2015-11-03 (×3): qty 1

## 2015-11-03 MED ORDER — LORAZEPAM 0.5 MG PO TABS
0.5000 mg | ORAL_TABLET | Freq: Four times a day (QID) | ORAL | Status: DC | PRN
  Administered 2015-11-03 – 2015-11-06 (×2): 0.5 mg via ORAL
  Filled 2015-11-03 (×2): qty 1

## 2015-11-03 MED ORDER — ASPIRIN EC 325 MG PO TBEC
325.0000 mg | DELAYED_RELEASE_TABLET | Freq: Every day | ORAL | Status: DC
  Administered 2015-11-03 – 2015-11-06 (×4): 325 mg via ORAL
  Filled 2015-11-03 (×5): qty 1

## 2015-11-03 MED ORDER — MIDAZOLAM HCL 2 MG/2ML IJ SOLN
INTRAMUSCULAR | Status: AC
  Administered 2015-11-03: 1 mg via INTRAVENOUS
  Filled 2015-11-03: qty 2

## 2015-11-03 MED ORDER — MIDAZOLAM HCL 2 MG/2ML IJ SOLN
1.0000 mg | Freq: Once | INTRAMUSCULAR | Status: AC
  Administered 2015-11-03: 1 mg via INTRAVENOUS

## 2015-11-03 MED ORDER — ASPIRIN EC 81 MG PO TBEC: 81.0000 mg | DELAYED_RELEASE_TABLET | Freq: Every day | ORAL | Status: DC

## 2015-11-03 MED FILL — Nitroglycerin IV Soln 100 MCG/ML in D5W: INTRA_ARTERIAL | Qty: 10 | Status: AC

## 2015-11-03 MED FILL — Sodium Chloride IV Soln 0.9%: INTRAVENOUS | Qty: 2000 | Status: AC

## 2015-11-03 NOTE — Progress Notes (Signed)
Anesthesiology Follow-up:  Awake and alert, neuro intact, complaining of incisional pain and dry mouth, hemodynamically stable.  VS: T-37.4 BP- 110/68 HR -72 (SR)  RR- 14 O2 Sat 98% PA 29/12 CO/CI- 3.1/1.8  K-3.8 BUN/Cr- 14/0.87 glucose 110 H/H 11.0/34.7 platelets- 117,000   Extubated 7 hours post-op  81 year old female 1 day S/P Off pump CABG X 1, doing well overall, no apparent complications  Roberts Gaudy

## 2015-11-03 NOTE — Progress Notes (Addendum)
[address]       [place] [address]             [phone]        CARDIOTHORACIC SURGERY PROGRESS NOTE   R1 Day Post-Op Procedure(s) (LRB): OFF PUMP CORONARY ARTERY BYPASS GRAFTING (CABG) TIMES ONE (N/A) TRANSESOPHAGEAL ECHOCARDIOGRAM (TEE) (N/A)  Subjective: Looks good. Mild soreness in chest.  No SOB  Objective: Vital signs: BP Readings from Last 1 Encounters:  11/03/15 98/51   Pulse Readings from Last 1 Encounters:  11/03/15 69   Resp Readings from Last 1 Encounters:  11/03/15 15   Temp Readings from Last 1 Encounters:  11/03/15 99.5 F (37.5 C)     Hemodynamics: PAP: (20-46)/(6-30) 27/10 mmHg CO:  [1.6 L/min-3.4 L/min] 3.1 L/min CI:  [0.9 L/min/m2-1.9 L/min/m2] 1.8 L/min/m2  Physical Exam:  Rhythm:   sinus  Breath sounds: clear  Heart sounds:  RRR  Incisions:  Dressing dry, intact  Abdomen:  Soft, non-distended, non-tender  Extremities:  Warm, well-perfused  Chest tubes:  Low volume thin serosanguinous output, no air leak    Intake/Output from previous day: 12/06 0701 - 12/07 0700 In: 5884.8 [I.V.:4284.8; IV Piggyback:1600] Out: 2075 [Urine:1610; Blood:75; Chest Tube:390] Intake/Output this shift: Total I/O In: -  Out: 25 [Urine:25]  Lab Results:  CBC: Recent Labs  11/02/15 1915 11/03/15 0454  WBC 8.0 7.9  HGB 11.2* 11.0*  HCT 33.4* 34.7*  PLT 117* 117*    BMET:  Recent Labs  11/02/15 0958 11/02/15 1104 11/02/15 1915 11/03/15 0454  NA 143 140  --  138  K 3.6 3.7  --  3.9  CL 105  --   --  112*  CO2  --   --   --  21*  GLUCOSE 94 111*  --  110*  BUN 19  --   --  14  CREATININE 0.80  --  0.75 0.87  CALCIUM  --   --   --  7.7*     PT/INR:   Recent Labs  11/02/15 1100  LABPROT 15.2  INR 1.18    CBG (last 3)   Recent Labs  11/02/15 2057 11/03/15 0015 11/03/15 0453  GLUCAP 104* 96 102*    ABG    Component Value Date/Time   PHART 7.284* 11/02/2015 1859   PCO2ART 40.4 11/02/2015 1859   PO2ART 109.0* 11/02/2015 1859   HCO3 19.2* 11/02/2015 1859   TCO2 20 11/02/2015 1859   ACIDBASEDEF 7.0* 11/02/2015 1859   O2SAT 98.0 11/02/2015 1859    CXR: PORTABLE CHEST 1 VIEW  COMPARISON: Portable chest x-ray of November 02, 2015  FINDINGS: The lungs are mildly hypoinflated. There is a small right pleural effusion and trace left pleural effusion. There is subsegmental atelectasis at the left lung base and in the left upper lobe which is stable. The cardiac silhouette is enlarged. The pulmonary vascularity is not engorged.  The trachea and esophagus have been extubated. The Swan-Ganz catheter tip projects in the proximal right main pulmonary artery. The left-sided chest tubes are in stable position. The mediastinal drain is unchanged. There are 7 intact sternal wires.  IMPRESSION: Stable small bilateral pleural effusions and left lower lobe atelectasis. There is no significant pulmonary vascular congestion. The remaining support tubes are in reasonable position.   Electronically Signed  By: David Martinique M.D.  On: 11/03/2015 07:13  Assessment/Plan: S/P Procedure(s) (LRB): OFF PUMP CORONARY ARTERY BYPASS GRAFTING (CABG) TIMES ONE (N/A) TRANSESOPHAGEAL ECHOCARDIOGRAM (TEE) (N/A)  Doing well POD1 Maintaining NSR w/ stable hemodynamics O2 sats 98% on 4 L/min Expected post op acute blood loss anemia, mild Expected post op atelectasis Hypertension Hyperlipidemia - reportedly intolerant of statins History of recurrent UTI's and interstitial cystitis - preop urine culture "multiple species" Anxiety   Mobilize  D/C tubes and lines  Pulm toilet  Recheck urine culture  D/C foley  Defer lipid management to Cardiology team  Transfer step down   Rexene Alberts, MD 11/03/2015 8:23 AM

## 2015-11-03 NOTE — Progress Notes (Signed)
Patient Name: Emily Wagner Date of Encounter: 11/03/2015    SUBJECTIVE: Emily Wagner reports feeling like a truck hit her. She has incisional tenderness and discomfort with moving. She stated this morning for the first time and denied any chest pain or shortness of breath with doing so.  TELEMETRY:  Mostly sinus rhythm with occasional PVCs. On 8 beat run of NSVT today.  Filed Vitals:   11/03/15 1200 11/03/15 1246 11/03/15 1300 11/03/15 1400  BP: 137/66  125/63 127/65  Pulse: 80  72 75  Temp:  97.5 F (36.4 C)    TempSrc:  Oral    Resp: 16  14 14   Height:      Weight:      SpO2: 91%  94% 93%    Intake/Output Summary (Last 24 hours) at 11/03/15 1426 Last data filed at 11/03/15 1305  Gross per 24 hour  Intake 2322.86 ml  Output   1145 ml  Net 1177.86 ml    Physical Exam: Blood pressure 127/65, pulse 75, temperature 97.5 F (36.4 C), temperature source Oral, resp. rate 14, height 5\' 4"  (1.626 m), weight 78.1 kg (172 lb 2.9 oz), last menstrual period 03/21/1971, SpO2 93 %. Weight change: 6.6 kg (14 lb 8.8 oz)  Wt Readings from Last 3 Encounters:  11/03/15 78.1 kg (172 lb 2.9 oz)  10/29/15 73.624 kg (162 lb 5 oz)  10/21/15 70.308 kg (155 lb)    Gen: Well appearing. Tearful throughout the interview. Neck: JVD 4 cm above the clavicle at 45. CV: Regular rate and rhythm. No murmurs, rubs, gallops. Normal S1/S2 Lungs: Clear bilaterally on anterior exam. Abdomen soft, nontender, nondistended. Active bowel sounds. Extremities: Warm and well perfused. Trace edema    LABS: Basic Metabolic Panel:  Recent Labs  11/02/15 1915 11/02/15 1925 11/03/15 0454  NA  --  143 138  K  --  3.9 3.9  CL  --  106 112*  CO2  --   --  21*  GLUCOSE  --  132* 110*  BUN  --  14 14  CREATININE 0.75 0.80 0.87  CALCIUM  --   --  7.7*  MG 2.5*  --  2.2   CBC:  Recent Labs  11/02/15 1915 11/02/15 1925 11/03/15 0454  WBC 8.0  --  7.9  HGB 11.2* 11.6* 11.0*  HCT 33.4* 34.0*  34.7*  MCV 94.1  --  96.7  PLT 117*  --  117*   Cardiac Enzymes: No results for input(s): CKTOTAL, CKMB, CKMBINDEX, TROPONINI in the last 72 hours. BNP: Invalid input(s): POCBNP Hemoglobin A1C: No results for input(s): HGBA1C in the last 72 hours. Fasting Lipid Panel: No results for input(s): CHOL, HDL, LDLCALC, TRIG, CHOLHDL, LDLDIRECT in the last 72 hours.  Radiology/Studies:  No new data is available  Echo 10/31/15: Physical Exam: Blood pressure 127/65, pulse 75, temperature 97.5 F (36.4 C), temperature source Oral, resp. rate 14, height 5\' 4"  (1.626 m), weight 78.1 kg (172 lb 2.9 oz), last menstrual period 03/21/1971, SpO2 93 %. Weight change: 6.6 kg (14 lb 8.8 oz)  Wt Readings from Last 3 Encounters:  11/03/15 78.1 kg (172 lb 2.9 oz)  10/29/15 73.624 kg (162 lb 5 oz)  10/21/15 70.308 kg (155 lb)    Gen: Well appearing. Tearful throughout the interview. Neck: JVD 4 cm above the clavicle at 45. CV: Regular rate and rhythm. No murmurs, rubs, gallops. Normal S1/S2 Lungs: Clear bilaterally on anterior exam. Abdomen soft,  nontender, nondistended. Active bowel sounds. Extremities: Warm and well perfused. Trace edema    ASSESSMENT:  # CAD with CCS Class III-IV angina s/p off-pump LIMA to LAD: Emily Wagner is doing well postoperatively. She will be transferred to stepdown today.  She has been tolerant of atorvastatin, pravastatin, simvastatin, and rosuvastatin. She was tried low dosing and intermittent dosing without success. Given that she does have coronary disease and has failed multiple agents, she could be a candidate for Repatha.  We will set her up for an appointment with our pharmacist as an outpatient for consideration of this therapy.  Continue aspirin and metoprolol.  Aspirin can be switched to 81 mg.  Will arrange for follow up with Emily Wagner post hospitalization.  # Hypertension: BP well-controlled to hypotensive.  She is asymptomatic.  Continue metoprolol.  #  Hyperlipidemia: Lipids are not at goal and she has not tolerated statins or Zetia as above. We will consider her for Repatha as an outpatient. She has significant cost concerns, as she has Medicare and is in the donut hole.   # NSVT: Emily Wagner had an episode of NSVT today that was asymptomatic.  LVEF is normal.  Will continue to monitor for now.  Unable to titrate beta blocker due to hypotension.  Maintain K>4, Mg>2.  Signed, Sharol Harness, MD 11/03/2015, 2:26 PM

## 2015-11-03 NOTE — Care Management Note (Signed)
Case Management Note  Patient Details  Name: Emily Wagner MRN: [id number] Date of Birth: 1932/12/22  Subjective/Objective:   Pt lives with dtr who is disabled but, according to pt, dtr can mainly care for herself and also provide minimal care for pt.  Pt also states she has another dtr who is only 2 miles away and a grandson who is nearby.  Her church will provide meals for 2 weeks.                            Expected Discharge Plan:  [place]  Discharge planning Services  CM Consult  Status of Service:  In process, will continue to follow  Medicare Important Message Given:  Yes  Girard Cooter, RN 11/03/2015, 2:07 PM

## 2015-11-04 ENCOUNTER — Encounter (HOSPITAL_COMMUNITY): Payer: Self-pay | Admitting: Cardiovascular Disease

## 2015-11-04 ENCOUNTER — Telehealth: Payer: Self-pay | Admitting: Family Medicine

## 2015-11-04 ENCOUNTER — Inpatient Hospital Stay (HOSPITAL_COMMUNITY): Payer: Medicare Other

## 2015-11-04 DIAGNOSIS — I480 Paroxysmal atrial fibrillation: Secondary | ICD-10-CM

## 2015-11-04 DIAGNOSIS — I4891 Unspecified atrial fibrillation: Secondary | ICD-10-CM

## 2015-11-04 HISTORY — DX: Unspecified atrial fibrillation: I48.91

## 2015-11-04 LAB — CBC
HEMATOCRIT: 36.8 % (ref 36.0–46.0)
HEMOGLOBIN: 12.2 g/dL (ref 12.0–15.0)
MCH: 31.9 pg (ref 26.0–34.0)
MCHC: 33.2 g/dL (ref 30.0–36.0)
MCV: 96.1 fL (ref 78.0–100.0)
Platelets: 123 10*3/uL — ABNORMAL LOW (ref 150–400)
RBC: 3.83 MIL/uL — ABNORMAL LOW (ref 3.87–5.11)
RDW: 13.9 % (ref 11.5–15.5)
WBC: 10.8 10*3/uL — ABNORMAL HIGH (ref 4.0–10.5)

## 2015-11-04 LAB — BASIC METABOLIC PANEL
Anion gap: 9 (ref 5–15)
BUN: 15 mg/dL (ref 6–20)
CHLORIDE: 108 mmol/L (ref 101–111)
CO2: 21 mmol/L — AB (ref 22–32)
CREATININE: 0.94 mg/dL (ref 0.44–1.00)
Calcium: 8.3 mg/dL — ABNORMAL LOW (ref 8.9–10.3)
GFR calc non Af Amer: 56 mL/min — ABNORMAL LOW (ref >60)
Glucose, Bld: 113 mg/dL — ABNORMAL HIGH (ref 65–99)
Potassium: 4 mmol/L (ref 3.5–5.1)
Sodium: 138 mmol/L (ref 135–145)

## 2015-11-04 LAB — GLUCOSE, CAPILLARY
GLUCOSE-CAPILLARY: 111 mg/dL — AB (ref 65–99)
GLUCOSE-CAPILLARY: 110 mg/dL — AB (ref 65–99)
GLUCOSE-CAPILLARY: 103 mg/dL — AB (ref 65–99)
Glucose-Capillary: 104 mg/dL — ABNORMAL HIGH (ref 65–99)
Glucose-Capillary: 85 mg/dL (ref 65–99)
Glucose-Capillary: 92 mg/dL (ref 65–99)
Glucose-Capillary: 98 mg/dL (ref 65–99)

## 2015-11-04 LAB — URINE CULTURE
CULTURE: NO GROWTH
Special Requests: NORMAL

## 2015-11-04 MED ORDER — METOPROLOL TARTRATE 25 MG PO TABS
25.0000 mg | ORAL_TABLET | Freq: Once | ORAL | Status: AC
  Administered 2015-11-04: 25 mg via ORAL
  Filled 2015-11-04: qty 1

## 2015-11-04 MED ORDER — AMIODARONE HCL 200 MG PO TABS
200.0000 mg | ORAL_TABLET | Freq: Two times a day (BID) | ORAL | Status: DC
  Filled 2015-11-04: qty 1

## 2015-11-04 MED ORDER — METOPROLOL TARTRATE 25 MG PO TABS
25.0000 mg | ORAL_TABLET | Freq: Two times a day (BID) | ORAL | Status: DC
  Administered 2015-11-04: 25 mg via ORAL
  Filled 2015-11-04: qty 1

## 2015-11-04 MED ORDER — WARFARIN - PHYSICIAN DOSING INPATIENT
Freq: Every day | Status: DC
  Administered 2015-11-05 – 2015-11-07 (×2)

## 2015-11-04 MED ORDER — METOPROLOL TARTRATE 50 MG PO TABS: 50.0000 mg | ORAL_TABLET | Freq: Two times a day (BID) | ORAL | Status: DC

## 2015-11-04 MED ORDER — AMIODARONE HCL IN DEXTROSE 360-4.14 MG/200ML-% IV SOLN
30.0000 mg/h | INTRAVENOUS | Status: DC
  Administered 2015-11-04 – 2015-11-05 (×2): 30 mg/h via INTRAVENOUS
  Filled 2015-11-04 (×2): qty 200

## 2015-11-04 MED ORDER — LISINOPRIL 10 MG PO TABS
10.0000 mg | ORAL_TABLET | Freq: Every day | ORAL | Status: DC
  Administered 2015-11-04: 10 mg via ORAL
  Filled 2015-11-04: qty 1

## 2015-11-04 MED ORDER — WARFARIN SODIUM 2.5 MG PO TABS
2.5000 mg | ORAL_TABLET | Freq: Every day | ORAL | Status: DC
  Administered 2015-11-04: 2.5 mg via ORAL
  Filled 2015-11-04: qty 1

## 2015-11-04 MED ORDER — METOPROLOL TARTRATE 50 MG PO TABS: 50.0000 mg | ORAL_TABLET | Freq: Once | ORAL | Status: DC

## 2015-11-04 MED ORDER — AMIODARONE HCL IN DEXTROSE 360-4.14 MG/200ML-% IV SOLN
60.0000 mg/h | INTRAVENOUS | Status: AC
  Administered 2015-11-04: 60 mg/h via INTRAVENOUS
  Filled 2015-11-04: qty 200

## 2015-11-04 MED ORDER — ZOLPIDEM TARTRATE 5 MG PO TABS
5.0000 mg | ORAL_TABLET | Freq: Every evening | ORAL | Status: DC | PRN
  Administered 2015-11-04 – 2015-11-07 (×4): 5 mg via ORAL
  Filled 2015-11-04 (×4): qty 1

## 2015-11-04 MED ORDER — AMIODARONE HCL IN DEXTROSE 360-4.14 MG/200ML-% IV SOLN
INTRAVENOUS | Status: AC
  Filled 2015-11-04: qty 200

## 2015-11-04 MED ORDER — DIPHENHYDRAMINE HCL 12.5 MG/5ML PO ELIX
12.5000 mg | ORAL_SOLUTION | Freq: Four times a day (QID) | ORAL | Status: DC | PRN
  Administered 2015-11-04 – 2015-11-08 (×2): 12.5 mg via ORAL
  Filled 2015-11-04 (×2): qty 10

## 2015-11-04 MED ORDER — AMIODARONE LOAD VIA INFUSION
150.0000 mg | Freq: Once | INTRAVENOUS | Status: AC
  Administered 2015-11-04: 150 mg via INTRAVENOUS
  Filled 2015-11-04: qty 83.34

## 2015-11-04 MED ORDER — METOPROLOL TARTRATE 1 MG/ML IV SOLN
5.0000 mg | Freq: Once | INTRAVENOUS | Status: AC
  Administered 2015-11-04: 5 mg via INTRAVENOUS
  Filled 2015-11-04: qty 5

## 2015-11-04 MED ORDER — AMIODARONE HCL 200 MG PO TABS
200.0000 mg | ORAL_TABLET | Freq: Two times a day (BID) | ORAL | Status: DC
  Administered 2015-11-05 – 2015-11-08 (×7): 200 mg via ORAL
  Filled 2015-11-04 (×7): qty 1

## 2015-11-04 MED ORDER — WARFARIN VIDEO
Freq: Once | Status: DC
Start: 2015-11-04 — End: 2015-11-08

## 2015-11-04 NOTE — Progress Notes (Signed)
Patient Name: Emily Wagner Date of Encounter: 11/04/2015    SUBJECTIVE: Emily Wagner reports nausea.  When in atrial fibrillation she feels fluttering in her shoulder.  TELEMETRY:  Sinus rhythm with episodes of atrial fibrillation, rate in the 130s.  Filed Vitals:   11/04/15 0654 11/04/15 0657 11/04/15 0952 11/04/15 1241  BP: 162/98  166/82 158/98  Pulse: 132  70 68  Temp:    97.5 F (36.4 C)  TempSrc:    Oral  Resp:    18  Height:      Weight:  78 kg (171 lb 15.3 oz)    SpO2:    91%    Intake/Output Summary (Last 24 hours) at 11/04/15 1432 Last data filed at 11/04/15 1243  Gross per 24 hour  Intake    480 ml  Output   1500 ml  Net  -1020 ml    Physical Exam: Blood pressure 127/65, pulse 75, temperature 97.5 F (36.4 C), temperature source Oral, resp. rate 14, height 5\' 4"  (1.626 m), weight 78.1 kg (172 lb 2.9 oz), last menstrual period 03/21/1971, SpO2 93 %. Weight change: 6.6 kg (14 lb 8.8 oz)  Wt Readings from Last 3 Encounters:  11/03/15 78.1 kg (172 lb 2.9 oz)  10/29/15 73.624 kg (162 lb 5 oz)  10/21/15 70.308 kg (155 lb)    Gen: Well appearing. Tearful throughout the interview. Neck: JVD 2 cm above the clavicle at 45. CV: Regular rate and rhythm. No murmurs, rubs, gallops. Normal S1/S2 Lungs: Clear bilaterally on anterior exam. Abdomen soft, nontender, nondistended. Active bowel sounds. Extremities: Warm and well perfused. No edema   LABS: Basic Metabolic Panel:  Recent Labs  11/02/15 1915  11/03/15 0454 11/04/15 0530  NA  --   < > 138 138  K  --   < > 3.9 4.0  CL  --   < > 112* 108  CO2  --   --  21* 21*  GLUCOSE  --   < > 110* 113*  BUN  --   < > 14 15  CREATININE 0.75  < > 0.87 0.94  CALCIUM  --   --  7.7* 8.3*  MG 2.5*  --  2.2  --   < > = values in this interval not displayed. CBC:  Recent Labs  11/03/15 0454 11/04/15 0530  WBC 7.9 10.8*  HGB 11.0* 12.2  HCT 34.7* 36.8  MCV 96.7 96.1  PLT 117* 123*   Cardiac  Enzymes: No results for input(s): CKTOTAL, CKMB, CKMBINDEX, TROPONINI in the last 72 hours. BNP: Invalid input(s): POCBNP Hemoglobin A1C: No results for input(s): HGBA1C in the last 72 hours. Fasting Lipid Panel: No results for input(s): CHOL, HDL, LDLCALC, TRIG, CHOLHDL, LDLDIRECT in the last 72 hours.  Radiology/Studies:  No new data is available  Echo 10/31/15: Physical Exam: BP 158/98 mmHg  Pulse 68  Temp(Src) 97.5 F (36.4 C) (Oral)  Resp 18  Ht 5\' 4"  (1.626 m)  Wt 78 kg (171 lb 15.3 oz)  BMI 29.50 kg/m2  SpO2 91%  LMP 03/21/1971               Filed Weights   11/02/15 0454 11/03/15 0445 11/04/15 0657  Weight: 71.5 kg (157 lb 10.1 oz) 78.1 kg (172 lb 2.9 oz) 78 kg (171 lb 15.3 oz)    Gen: Well appearing. No acute distress. Neck: JVD 4 cm above the clavicle at 45. CV: Regular rate and rhythm.  No murmurs, rubs, gallops. Normal S1/S2 Lungs: Clear bilaterally on anterior exam. Abdomen soft, nontender, nondistended. Active bowel sounds. Extremities: Warm and well perfused. Trace edema    ASSESSMENT:  # Paroxysmal atrial fibrillation: Emily Wagner has developed post-op atrial fibrillation and has been appropriately started on amiodarone and an increased dose of metoprolol.  She has remained in sinus rhythm since 10:30.  She was started on warfarin without a bridge, given her CHAD2-Vasc score of at least 3.  Not a good candidate for NOAC given cost.  # CAD with CCS Class III-IV angina s/p off-pump LIMA to LAD: Stable post-op. - Will follow up in lipid clinic for consideration of PCSK-9 inhibitor - Continue aspirin and statin  # Hypertension: BP elevated.  Increased metoprolol today.  If not improved by tomorrow, would increase lisinopril as well.   Signed, Sharol Harness, MD 11/04/2015, 2:32 PM

## 2015-11-04 NOTE — Progress Notes (Signed)
Pt blood pressure elevated this morning 181/103 automatic and 162/98 manual cuff, pt denies headache or any pain, MD notified and ordered Metoprolol 5mg  IV now and increased daily dose from 12.5 mg to 25 mg, pt sitting in chair, call bell in reach, RN notified oncoming RN of blood pressure elevation, blood pressure will be rechecked by oncoming shift.   11/04/2015 Izola Price, RN

## 2015-11-04 NOTE — Progress Notes (Signed)
Pt had an episode of atrial fibrillation with rapid ventricular response, EKG confirms A. Fib with RVR, pt asymptomatic and resting, pt denies SOB, pain, dizziness, or feeling any different, MD notified and ordered Amiodarone 150 mg bolus and drip per protocol, pt now in normal sinus rhythm with heart rate between 68-80 bpm, RN will continue to monitor, pt call bell in reach and bed in lowest position, bed alarm in use.    11/04/2015 Izola Price, RN

## 2015-11-04 NOTE — Progress Notes (Addendum)
CARDIAC REHAB PHASE I   PRE:  Rate/Rhythm: 13 SR initially, in and out of a. Fib 110's-120s  BP:  Sitting: 171/67        SaO2: 92 RA  MODE:  Ambulation: 300 ft   POST:  Rate/Rhythm: 86 SR, then in and out of a. Fib 110's-120s  BP:  Sitting: 175/84         SaO2: 96 RA  Pt in recliner, in and out of a.fib, HR 110s-120s when a.fib. Pt asymptomatic. BP elevated. MD aware. Pt ambulated 300 ft on RA, IV, rolling walker, assist x1, steady gait, tolerated well. Pt c/o mild DOE, denies pain, dizziness, declined rest stop. Pt in and out of a.fib post ambulation as well, remains asymptomatic. Pt assisted to bathroom, states she is "passing gas" and thinks she need to have a bowel movement, unsuccessful. Pt states she hasn't had a bowel movement since Monday. Pt did have difficulty standing from toilet, needed assist of gait belt, recommend commode over toilet or use of bedside commode. Pt to recliner after walk, feet elevated, call bell within reach. Encouraged IS, additional ambulation x2 today. Pt verbalized understanding. Will follow.   [id number] Lenna Sciara, RN, BSN 11/04/2015 11:00 AM

## 2015-11-04 NOTE — Discharge Instructions (Addendum)
Information on my medicine - Coumadin   (Warfarin)  This medication education was reviewed with me or my healthcare representative as part of my discharge preparation.  The pharmacist that spoke with me during my hospital stay was:  Dareen Piano, [facility]  Why was Coumadin prescribed for you? Coumadin was prescribed for you because you have a blood clot or a medical condition that can cause an increased risk of forming blood clots. Blood clots can cause serious health problems by blocking the flow of blood to the heart, lung, or brain. Coumadin can prevent harmful blood clots from forming. As a reminder your indication for Coumadin is:   Stroke Prevention Because Of Atrial Fibrillation  What test will check on my response to Coumadin? While on Coumadin (warfarin) you will need to have an INR test regularly to ensure that your dose is keeping you in the desired range. The INR (international normalized ratio) number is calculated from the result of the laboratory test called prothrombin time (PT).  If an INR APPOINTMENT HAS NOT ALREADY BEEN MADE FOR YOU please schedule an appointment to have this lab work done by your health care provider within 7 days. Your INR goal is usually a number between:  2 to 3 or your provider may give you a more narrow range like 2-2.5.  Ask your health care provider during an office visit what your goal INR is.  What  do you need to  know  About  COUMADIN? Take Coumadin (warfarin) exactly as prescribed by your healthcare provider about the same time each day.  DO NOT stop taking without talking to the doctor who prescribed the medication.  Stopping without other blood clot prevention medication to take the place of Coumadin may increase your risk of developing a new clot or stroke.  Get refills before you run out.  What do you do if you miss a dose? If you miss a dose, take it as soon as you remember on the same day then continue your regularly scheduled regimen the  next day.  Do not take two doses of Coumadin at the same time.  Important Safety Information A possible side effect of Coumadin (Warfarin) is an increased risk of bleeding. You should call your healthcare provider right away if you experience any of the following: ? Bleeding from an injury or your nose that does not stop. ? Unusual colored urine (red or dark brown) or unusual colored stools (red or black). ? Unusual bruising for unknown reasons. ? A serious fall or if you hit your head (even if there is no bleeding).  Some foods or medicines interact with Coumadin (warfarin) and might alter your response to warfarin. To help avoid this: ? Eat a balanced diet, maintaining a consistent amount of Vitamin K. ? Notify your provider about major diet changes you plan to make. ? Avoid alcohol or limit your intake to 1 drink for women and 2 drinks for men per day. (1 drink is 5 oz. wine, 12 oz. beer, or 1.5 oz. liquor.)  Make sure that ANY health care provider who prescribes medication for you knows that you are taking Coumadin (warfarin).  Also make sure the healthcare provider who is monitoring your Coumadin knows when you have started a new medication including herbals and non-prescription products.  Coumadin (Warfarin)  Major Drug Interactions  Increased Warfarin Effect Decreased Warfarin Effect  Alcohol (large quantities) Antibiotics (esp. Septra/Bactrim, Flagyl, Cipro) Amiodarone (Cordarone) Aspirin (ASA) Cimetidine (Tagamet) Megestrol (Megace) NSAIDs (ibuprofen,  naproxen, etc.) Piroxicam (Feldene) Propafenone (Rythmol SR) Propranolol (Inderal) Isoniazid (INH) Posaconazole (Noxafil) Barbiturates (Phenobarbital) Carbamazepine (Tegretol) Chlordiazepoxide (Librium) Cholestyramine (Questran) Griseofulvin Oral Contraceptives Rifampin Sucralfate (Carafate) Vitamin K   Coumadin (Warfarin) Major Herbal Interactions  Increased Warfarin Effect Decreased Warfarin Effect   Garlic Ginseng Ginkgo biloba Coenzyme Q10 Green tea St. Johns wort    Coumadin (Warfarin) FOOD Interactions  Eat a consistent number of servings per week of foods HIGH in Vitamin K (1 serving =  cup)  Collards (cooked, or boiled & drained) Kale (cooked, or boiled & drained) Mustard greens (cooked, or boiled & drained) Parsley *serving size only =  cup Spinach (cooked, or boiled & drained) Swiss chard (cooked, or boiled & drained) Turnip greens (cooked, or boiled & drained)  Eat a consistent number of servings per week of foods MEDIUM-HIGH in Vitamin K (1 serving = 1 cup)  Asparagus (cooked, or boiled & drained) Broccoli (cooked, boiled & drained, or raw & chopped) Brussel sprouts (cooked, or boiled & drained) *serving size only =  cup Lettuce, raw (green leaf, endive, romaine) Spinach, raw Turnip greens, raw & chopped   These websites have more information on Coumadin (warfarin):  FailFactory.se; VeganReport.com.au;  Coronary Artery Bypass Grafting, Care After Refer to this sheet in the next few weeks. These instructions provide you with information on caring for yourself after your procedure. Your health care provider may also give you more specific instructions. Your treatment has been planned according to current medical practices, but problems sometimes occur. Call your health care provider if you have any problems or questions after your procedure. WHAT TO EXPECT AFTER THE PROCEDURE Recovery from surgery will be different for everyone. Some people feel well after 3 or 4 weeks, while for others it takes longer. After your procedure, it is typical to have the following:  Nausea and a lack of appetite.   Constipation.  Weakness and fatigue.   Depression or irritability.   Pain or discomfort at your incision site. HOME CARE INSTRUCTIONS  Take medicines only as directed by your health care provider. Do not stop taking medicines or start any new  medicines without first checking with your health care provider.  Take your pulse as directed by your health care provider.  Perform deep breathing as directed by your health care provider. If you were given a device called an incentive spirometer, use it to practice deep breathing several times a day. Support your chest with a pillow or your arms when you take deep breaths or cough.  Keep incision areas clean, dry, and protected. Remove or change any bandages (dressings) only as directed by your health care provider. You may have skin adhesive strips over the incision areas. Do not take the strips off. They will fall off on their own.  Check incision areas daily for any swelling, redness, or drainage.  If incisions were made in your legs, do the following:  Avoid crossing your legs.   Avoid sitting for long periods of time. Change positions every 30 minutes.   Elevate your legs when you are sitting.  Wear compression stockings as directed by your health care provider. These stockings help keep blood clots from forming in your legs.  Take showers once your health care provider approves. Until then, only take sponge baths. Pat incisions dry. Do not rub incisions with a washcloth or towel. Do not take baths, swim, or use a hot tub until your health care provider approves.  Eat foods that are high in fiber,  such as raw fruits and vegetables, whole grains, beans, and nuts. Meats should be lean cut. Avoid canned, processed, and fried foods.  Drink enough fluid to keep your urine clear or pale yellow.  Weigh yourself every day. This helps identify if you are retaining fluid that may make your heart and lungs work harder.  Rest and limit activity as directed by your health care provider. You may be instructed to:  Stop any activity at once if you have chest pain, shortness of breath, irregular heartbeats, or dizziness. Get help right away if you have any of these symptoms.  Move around  frequently for short periods or take short walks as directed by your health care provider. Increase your activities gradually. You may need physical therapy or cardiac rehabilitation to help strengthen your muscles and build your endurance.  Avoid lifting, pushing, or pulling anything heavier than 10 lb (4.5 kg) for at least 6 weeks after surgery.  Do not drive until your health care provider approves.  Ask your health care provider when you may return to work.  Ask your health care provider when you may resume sexual activity.  Keep all follow-up visits as directed by your health care provider. This is important. SEEK MEDICAL CARE IF:  You have swelling, redness, increasing pain, or drainage at the site of an incision.  You have a fever.  You have swelling in your ankles or legs.  You have pain in your legs.   You gain 2 or more pounds (0.9 kg) a day.  You are nauseous or vomit.  You have diarrhea. SEEK IMMEDIATE MEDICAL CARE IF:  You have chest pain that goes to your jaw or arms.  You have shortness of breath.   You have a fast or irregular heartbeat.   You notice a "clicking" in your breastbone (sternum) when you move.   You have numbness or weakness in your arms or legs.  You feel dizzy or light-headed.  MAKE SURE YOU:  Understand these instructions.  Will watch your condition.  Will get help right away if you are not doing well or get worse.   This information is not intended to replace advice given to you by your health care provider. Make sure you discuss any questions you have with your health care provider.   Document Released: 06/02/2005 Document Revised: 12/04/2014 Document Reviewed: 04/22/2013 Elsevier Interactive Patient Education [employer].

## 2015-11-04 NOTE — Progress Notes (Signed)
Pt. Refused tylenol due to nausea and said she was not in pain. Emily Wagner 6:01 PM

## 2015-11-04 NOTE — Progress Notes (Addendum)
[address]       [place] [address]             [phone]      2 Days Post-Op Procedure(s) (LRB): OFF PUMP CORONARY ARTERY BYPASS GRAFTING (CABG) TIMES ONE (N/A) TRANSESOPHAGEAL ECHOCARDIOGRAM (TEE) (N/A) Subjective: Felt palpitations and nausea last night  Objective: Vital signs in last 24 hours: Temp:  [97.5 F (36.4 C)-99.7 F (37.6 C)] 97.9 F (36.6 C) (12/08 0502) Pulse Rate:  [71-132] 132 (12/08 0654) Cardiac Rhythm:  [-] Normal sinus rhythm (12/07 1904) Resp:  [14-20] 20 (12/08 0502) BP: (102-181)/(54-103) 162/98 mmHg (12/08 0654) SpO2:  [89 %-100 %] 92 % (12/08 0502) Arterial Line BP: (126)/(52) 126/52 mmHg (12/07 0900) Weight:  [171 lb 15.3 oz (78 kg)] 171 lb 15.3 oz (78 kg) (12/08 0657)  Hemodynamic parameters for last 24 hours: PAP: (39)/(21) 39/21 mmHg  Intake/Output from previous day: 12/07 0701 - 12/08 0700 In: 59.2 [I.V.:9.2; IV Piggyback:50] Out: 1670 [id number]; Chest Tube:50] Intake/Output this shift:    General appearance: alert, cooperative and no distress Heart: regular rate and rhythm Lungs: dim in lower fields Abdomen: benign Extremities: minor edema Wound: dressing CDI  Lab Results:  Recent Labs  11/03/15 0454 11/04/15 0530  WBC 7.9 10.8*  HGB 11.0* 12.2  HCT 34.7* 36.8  PLT 117* 123*   BMET:  Recent Labs  11/03/15 0454 11/04/15 0530  NA 138 138  K 3.9 4.0  CL 112* 108  CO2 21* 21*  GLUCOSE 110* 113*  BUN 14 15  CREATININE 0.87 0.94  CALCIUM 7.7* 8.3*    PT/INR:  Recent Labs  11/02/15 1100  LABPROT 15.2  INR 1.18   ABG    Component Value Date/Time   PHART 7.284* 11/02/2015 1859   HCO3 19.2* 11/02/2015 1859   TCO2 24.0 11/02/2015 1925   ACIDBASEDEF 7.0* 11/02/2015 1859   O2SAT 98.0 11/02/2015 1859   CBG (last 3)   Recent Labs  11/03/15 2010 11/04/15 0007 11/04/15 0453  GLUCAP 87 85 111*    Meds Scheduled Meds: . acetaminophen  1,000 mg Oral 4 times per day  .  antiseptic oral rinse  7 mL Mouth Rinse QID  . aspirin EC  325 mg Oral Daily  . bisacodyl  10 mg Oral Daily   Or  . bisacodyl  10 mg Rectal Daily  . Chlorhexidine Gluconate Cloth  6 each Topical Q0600  . docusate sodium  200 mg Oral Daily  . enoxaparin (LOVENOX) injection  30 mg Subcutaneous QHS  . insulin aspart  0-24 Units Subcutaneous 6 times per day  . levothyroxine  100 mcg Oral QAC breakfast  . metoprolol tartrate  25 mg Oral BID  . mupirocin ointment  1 application Nasal BID  . pantoprazole  40 mg Oral Daily  . sodium chloride  3 mL Intravenous Q12H  . sodium chloride  3 mL Intravenous Q12H   Continuous Infusions: . amiodarone 30 mg/hr (11/04/15 0648)   PRN Meds:.sodium chloride, diphenhydrAMINE, LORazepam, metoprolol, morphine injection, ondansetron (ZOFRAN) IV, oxyCODONE, sodium chloride, sodium chloride, traMADol  Xrays Dg Chest 2 View  11/04/2015  CLINICAL DATA:  Status post thoracic surgery. EXAM: CHEST  2 VIEW COMPARISON:  November 03, 2015. FINDINGS: Stable cardiomegaly. Sternotomy wires are noted. No pneumothorax is noted. Left-sided chest tube has been removed. No pneumothorax is noted. Left perihilar subsegmental atelectasis is noted. Mild bilateral pleural effusions remain. Bibasilar subsegmental atelectasis is noted as well. IMPRESSION: Left-sided chest  tube has been removed. No pneumothorax is noted. Left midlung and bibasilar subsegmental atelectasis is noted. Mild bilateral pleural effusions are noted as well. Electronically Signed   By: Marijo Conception, M.D.   On: 11/04/2015 07:51   Dg Chest Port 1 View  11/03/2015  CLINICAL DATA:  Status post CABG on November 02, 2015 EXAM: PORTABLE CHEST 1 VIEW COMPARISON:  Portable chest x-ray of November 02, 2015 FINDINGS: The lungs are mildly hypoinflated. There is a small right pleural effusion and trace left pleural effusion. There is subsegmental atelectasis at the left lung base and in the left upper lobe which is stable. The  cardiac silhouette is enlarged. The pulmonary vascularity is not engorged. The trachea and esophagus have been extubated. The Swan-Ganz catheter tip projects in the proximal right main pulmonary artery. The left-sided chest tubes are in stable position. The mediastinal drain is unchanged. There are 7 intact sternal wires. IMPRESSION: Stable small bilateral pleural effusions and left lower lobe atelectasis. There is no significant pulmonary vascular congestion. The remaining support tubes are in reasonable position. Electronically Signed   By: David  Martinique M.D.   On: 11/03/2015 07:13   Dg Chest Port 1 View  11/02/2015  CLINICAL DATA:  Status post coronary artery bypass grafting. Hypoxia peer EXAM: PORTABLE CHEST 1 VIEW COMPARISON:  October 31, 2015 FINDINGS: Endotracheal tube tip is 3.6 cm above the carina. Swan-Ganz catheter tip is in the main pulmonary outflow tract. Nasogastric tube tip and side port are below the diaphragm. There are left chest tubes and a mediastinal drain. There is no demonstrable pneumothorax. There is focal atelectatic change in the left upper lobe. The lungs are otherwise clear. The heart size and pulmonary vascularity are normal. No adenopathy apparent. IMPRESSION: Tube and catheter positions as described without pneumothorax. Focal atelectatic change left upper lobe. Electronically Signed   By: Lowella Grip III M.D.   On: 11/02/2015 11:35    Assessment/Plan: S/P Procedure(s) (LRB): OFF PUMP CORONARY ARTERY BYPASS GRAFTING (CABG) TIMES ONE (N/A) TRANSESOPHAGEAL ECHOCARDIOGRAM (TEE) (N/A)  1 afib- currently in sinus on amio gtt, consider po as has periph line infusion 2 nausea is improved , multifactorial , she is intolerant to many meds so amio may be a problem long term 3 labs are stable 4 hypertensive, will start ACEI 5 urine CX pending, TM 99.7 6 cont pulm toilet and mobilize   LOS: 6 days    GOLD,WAYNE E 11/04/2015  I have seen and examined the patient and  agree with the assessment and plan as outlined.  Had PAF overnight, now maintaining NSR on IV amiodarone.  I favor increasing beta blocker rather than pushing ACE-I right now - will increase metoprolol to 50 bid.  Will change amiodarone to oral - given her history she might not tolerate it.  Will start warfarin if PAF recurs.  Needs referral to lipid clinic since she is reportedly intolerant of statins.  Rexene Alberts, MD 11/04/2015 10:13 AM

## 2015-11-04 NOTE — Progress Notes (Signed)
Utilization review completed.  

## 2015-11-04 NOTE — Progress Notes (Signed)
Paged MD Prescott Gum in regards to extending amio drip until 0900 tomorrow (11/05/2015) when PO amio is due, due to pt having frequent PVCs and bursts of vtach. Orders given to continue amio drip until morning. Will continue to monitor pt.   Amram Maya, RN

## 2015-11-05 LAB — BASIC METABOLIC PANEL
ANION GAP: 9 (ref 5–15)
BUN: 10 mg/dL (ref 6–20)
CHLORIDE: 104 mmol/L (ref 101–111)
CO2: 24 mmol/L (ref 22–32)
Calcium: 8.5 mg/dL — ABNORMAL LOW (ref 8.9–10.3)
Creatinine, Ser: 0.77 mg/dL (ref 0.44–1.00)
GFR calc Af Amer: >60 mL/min (ref >60)
GFR calc non Af Amer: >60 mL/min (ref >60)
GLUCOSE: 113 mg/dL — AB (ref 65–99)
POTASSIUM: 3.4 mmol/L — AB (ref 3.5–5.1)
Sodium: 137 mmol/L (ref 135–145)

## 2015-11-05 LAB — GLUCOSE, CAPILLARY
GLUCOSE-CAPILLARY: 101 mg/dL — AB (ref 65–99)
GLUCOSE-CAPILLARY: 105 mg/dL — AB (ref 65–99)
GLUCOSE-CAPILLARY: 106 mg/dL — AB (ref 65–99)
GLUCOSE-CAPILLARY: 98 mg/dL (ref 65–99)
Glucose-Capillary: 89 mg/dL (ref 65–99)
Glucose-Capillary: 100 mg/dL — ABNORMAL HIGH (ref 65–99)

## 2015-11-05 LAB — TYPE AND SCREEN
ABO/RH(D): O POS
Antibody Screen: POSITIVE
DAT, IgG: NEGATIVE
UNIT DIVISION: 0
UNIT DIVISION: 0

## 2015-11-05 LAB — PROTIME-INR
INR: 1.21 (ref 0.00–1.49)
PROTHROMBIN TIME: 15.5 s — AB (ref 11.6–15.2)

## 2015-11-05 MED ORDER — LISINOPRIL 40 MG PO TABS
40.0000 mg | ORAL_TABLET | Freq: Every day | ORAL | Status: DC
  Administered 2015-11-06 – 2015-11-08 (×3): 40 mg via ORAL
  Filled 2015-11-05 (×3): qty 1

## 2015-11-05 MED ORDER — LISINOPRIL 10 MG PO TABS
20.0000 mg | ORAL_TABLET | Freq: Every day | ORAL | Status: DC
  Administered 2015-11-05: 20 mg via ORAL
  Filled 2015-11-05: qty 2

## 2015-11-05 MED ORDER — METOPROLOL TARTRATE 50 MG PO TABS
50.0000 mg | ORAL_TABLET | Freq: Three times a day (TID) | ORAL | Status: DC
  Administered 2015-11-05 – 2015-11-07 (×9): 50 mg via ORAL
  Filled 2015-11-05 (×9): qty 1

## 2015-11-05 MED ORDER — WARFARIN SODIUM 2.5 MG PO TABS
2.5000 mg | ORAL_TABLET | Freq: Every day | ORAL | Status: DC
  Administered 2015-11-05 – 2015-11-07 (×3): 2.5 mg via ORAL
  Filled 2015-11-05 (×3): qty 1

## 2015-11-05 MED ORDER — LISINOPRIL 10 MG PO TABS
20.0000 mg | ORAL_TABLET | Freq: Once | ORAL | Status: AC
  Administered 2015-11-05: 20 mg via ORAL

## 2015-11-05 MED ORDER — SODIUM CHLORIDE 0.9 % IJ SOLN
10.0000 mL | INTRAMUSCULAR | Status: DC | PRN
  Administered 2015-11-07 (×2): 20 mL
  Administered 2015-11-08: 10 mL
  Filled 2015-11-05 (×3): qty 40

## 2015-11-05 NOTE — Progress Notes (Signed)
CARDIAC REHAB PHASE I   PRE:  Rate/Rhythm: 77 SR, then 125 afib    BP: sitting 197/88    SaO2: 94 RA  Pt initially willing to walk although she feels bad with continued nausea. BP very elevated even after she has had all of her morning meds. Pt nauseated more and belching with moving in bed, felt like she would vomit. Went into Afib, rate 120s. Will hold ambulation for now. Encouraged her to try to walk later.  [id number]  Emily Wagner [place] CES, ACSM 11/05/2015 11:47 AM

## 2015-11-05 NOTE — Progress Notes (Signed)
RN notified by CCMD patient having PAF with 11 beats of VTAC and wide QRS. Pt. Is asymptomatic, BP 150/105, pt. Has no complaints at this time, RN completed an EKG patient is currently back in NSR HR in the 60's. RN will continue to monitor.  Sharene Skeans, RN

## 2015-11-05 NOTE — Progress Notes (Signed)
Pt BP was 172/92. Metoprolol 2.5 given. BP now 161/93. Will continue to monitor.

## 2015-11-05 NOTE — Progress Notes (Addendum)
[address]       [place] [address]             [phone]      3 Days Post-Op Procedure(s) (LRB): OFF PUMP CORONARY ARTERY BYPASS GRAFTING (CABG) TIMES ONE (N/A) TRANSESOPHAGEAL ECHOCARDIOGRAM (TEE) (N/A) Subjective: " I'm not taking rat poison". Feeling better this am but was nauseated a lot yesterday Remains hypertensive  Objective: Vital signs in last 24 hours: Temp:  [97.5 F (36.4 C)-98.3 F (36.8 C)] 98 F (36.7 C) (12/09 0426) Pulse Rate:  [67-78] 67 (12/09 0507) Cardiac Rhythm:  [-] Normal sinus rhythm (12/09 0700) Resp:  [18-20] 20 (12/09 0426) BP: (141-194)/(82-111) 170/85 mmHg (12/09 0507) SpO2:  [91 %-92 %] 92 % (12/09 0426) Weight:  [171 lb 8 oz (77.792 kg)] 171 lb 8 oz (77.792 kg) (12/09 0426)  Hemodynamic parameters for last 24 hours:    Intake/Output from previous day: 12/08 0701 - 12/09 0700 In: 480 [P.O.:480] Out: 1025 [Urine:1025] Intake/Output this shift:    General appearance: alert, cooperative and no distress Heart: regular rate and rhythm Lungs: dim in bases Abdomen: soft, non-tender Extremities: minor edema Wound: incis healing well  Lab Results:  Recent Labs  11/03/15 0454 11/04/15 0530  WBC 7.9 10.8*  HGB 11.0* 12.2  HCT 34.7* 36.8  PLT 117* 123*   BMET:  Recent Labs  11/03/15 0454 11/04/15 0530  NA 138 138  K 3.9 4.0  CL 112* 108  CO2 21* 21*  GLUCOSE 110* 113*  BUN 14 15  CREATININE 0.87 0.94  CALCIUM 7.7* 8.3*    PT/INR:  Recent Labs  11/05/15 0255  LABPROT 15.5*  INR 1.21   ABG    Component Value Date/Time   PHART 7.284* 11/02/2015 1859   HCO3 19.2* 11/02/2015 1859   TCO2 24.0 11/02/2015 1925   ACIDBASEDEF 7.0* 11/02/2015 1859   O2SAT 98.0 11/02/2015 1859   CBG (last 3)   Recent Labs  11/05/15 0024 11/05/15 0419 11/05/15 0806  GLUCAP 101* 98 105*    Meds Scheduled Meds: . acetaminophen  1,000 mg Oral 4 times per day  . amiodarone  200 mg Oral BID PC  .  antiseptic oral rinse  7 mL Mouth Rinse QID  . aspirin EC  325 mg Oral Daily  . bisacodyl  10 mg Oral Daily   Or  . bisacodyl  10 mg Rectal Daily  . Chlorhexidine Gluconate Cloth  6 each Topical Q0600  . docusate sodium  200 mg Oral Daily  . enoxaparin (LOVENOX) injection  30 mg Subcutaneous QHS  . insulin aspart  0-24 Units Subcutaneous 6 times per day  . levothyroxine  100 mcg Oral QAC breakfast  . lisinopril  10 mg Oral Daily  . metoprolol tartrate  50 mg Oral BID  . mupirocin ointment  1 application Nasal BID  . pantoprazole  40 mg Oral Daily  . sodium chloride  3 mL Intravenous Q12H  . sodium chloride  3 mL Intravenous Q12H  . warfarin  2.5 mg Oral q1800  . warfarin   Does not apply Once  . Warfarin - Physician Dosing Inpatient   Does not apply q1800   Continuous Infusions: . amiodarone 30 mg/hr (11/05/15 0008)   PRN Meds:.sodium chloride, diphenhydrAMINE, LORazepam, metoprolol, ondansetron (ZOFRAN) IV, oxyCODONE, sodium chloride, sodium chloride, traMADol, zolpidem  Xrays Dg Chest 2 View  11/04/2015  CLINICAL DATA:  Status post thoracic surgery. EXAM: CHEST  2 VIEW COMPARISON:  November 03, 2015. FINDINGS: Stable cardiomegaly. Sternotomy wires are noted. No pneumothorax is noted. Left-sided chest tube has been removed. No pneumothorax is noted. Left perihilar subsegmental atelectasis is noted. Mild bilateral pleural effusions remain. Bibasilar subsegmental atelectasis is noted as well. IMPRESSION: Left-sided chest tube has been removed. No pneumothorax is noted. Left midlung and bibasilar subsegmental atelectasis is noted. Mild bilateral pleural effusions are noted as well. Electronically Signed   By: Marijo Conception, M.D.   On: 11/04/2015 07:51    Assessment/Plan: S/P Procedure(s) (LRB): OFF PUMP CORONARY ARTERY BYPASS GRAFTING (CABG) TIMES ONE (N/A) TRANSESOPHAGEAL ECHOCARDIOGRAM (TEE) (N/A)  1 Sinus with episodes of PVC's and WCT- she does not want to take coumadin. She  says she will take plavix but wants to d/w MD. D/C IV amio this am 2 HTN persists with increase in metoprolol and 10 mg Lisinopril- will increase to 20- recheck renal fxn in am. Was on Calan at home- will hold on restarting with Ventricular ectopy.    LOS: 7 days    GOLD,WAYNE E 11/05/2015  I have seen and examined the patient and agree with the assessment and plan as outlined.  Agree with increasing lisinopril - will increase to 40 mg/day.  Will also increase metoprolol to 50 tid.  Would not resume verapamil.  I have discussed the need for anticoagulation related to PAF with patient and reasons why Plavix is not appropriate.  We discussed warfarin vs NOAC.  She is now agreeable with taking warfarin for 2-3 months.  If Afib does not resolve she could be switched to NOAC in the future if desired.  Place PICC line so that we can continue amiodarone load IV if necessary.  Rexene Alberts, MD 11/05/2015 10:24 AM

## 2015-11-05 NOTE — Progress Notes (Signed)
Pt wires removed. Patient went into vtach with a 9 beat run and pair of PVC's. bp blood pressure is 172/92. MD notified and ordered Metoprolol. Will continue to monitor.

## 2015-11-05 NOTE — Care Management Important Message (Signed)
Important Message  Patient Details  Name: Emily Wagner MRN: [id number] Date of Birth: 24-Nov-1933   Medicare Important Message Given:  Yes    Dawayne Patricia, RN 11/05/2015, 3:12 PM

## 2015-11-05 NOTE — Progress Notes (Signed)
Peripherally Inserted Central Catheter/Midline Placement  The IV Nurse has discussed with the patient and/or persons authorized to consent for the patient, the purpose of this procedure and the potential benefits and risks involved with this procedure.  The benefits include less needle sticks, lab draws from the catheter and patient may be discharged home with the catheter.  Risks include, but not limited to, infection, bleeding, blood clot (thrombus formation), and puncture of an artery; nerve damage and irregular heat beat.  Alternatives to this procedure were also discussed.  PICC/Midline Placement Documentation  PICC Double Lumen [id number] PICC Right Basilic 42 cm 1 cm (Active)  Indication for Insertion or Continuance of Line Poor Vasculature-patient has had multiple peripheral attempts or PIVs lasting less than 24 hours 11/05/2015  7:00 PM  Exposed Catheter (cm) 1 cm 11/05/2015  7:00 PM  Dressing Change Due 11/12/15 11/05/2015  7:00 PM       [place] 11/05/2015, 7:18 PM

## 2015-11-05 NOTE — Discharge Summary (Signed)
Physician Discharge Summary  Patient ID: Emily Wagner MRN: [id number] DOB/AGE: [birth year] 81 y.o.  Admit date: 10/29/2015 Discharge date: 11/08/2015  Admission Diagnoses:  Patient Active Problem List   Diagnosis Date Noted  . Infection of urinary tract 10/31/2015  . Unstable angina ([place]) 10/29/2015  . CAD (coronary artery disease) 10/29/2015  . Benign hypertensive heart disease without heart failure   . Interstitial cystitis   . Osteopenia   . Pelvic fracture ([place]) 05/30/2015  . Allergy to multiple medications 07/15/2013  . Renal insufficiency   . Adjustment disorder with mixed anxiety and depressed mood   . Osteoporosis   . ASCVD (arteriosclerotic cardiovascular disease)   . 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  . HYPERCHOLESTEROLEMIA 05/02/2008  . Essential hypertension 05/02/2008  . INTERSTITIAL CYSTITIS 05/02/2008  . DIVERTICULOSIS OF COLON 08/27/2003    Discharge Diagnoses:   Patient Active Problem List   Diagnosis Date Noted  . Atrial fibrillation ([place]) 11/04/2015  . 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 Infection of urinary tract 10/31/2015  . Unstable angina ([place]) 10/29/2015  . CAD (coronary artery disease) 10/29/2015  . Benign hypertensive heart disease without heart failure   . Interstitial cystitis   . Osteopenia   . Pelvic fracture ([place]) 05/30/2015  . Allergy to multiple medications 07/15/2013  . Renal insufficiency   . Adjustment disorder with mixed anxiety and depressed mood   . Osteoporosis   . ASCVD (arteriosclerotic cardiovascular disease)   . 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  . HYPERCHOLESTEROLEMIA 05/02/2008  . Essential hypertension 05/02/2008  . INTERSTITIAL CYSTITIS 05/02/2008  . DIVERTICULOSIS OF COLON 08/27/2003   Discharged Condition: good   History of Present Illness:  Emily Wagner is an 81 year old female with known history of coronary artery disease, hypertension, and hyperlipidemia who presents with symptoms consistent with unstable  angina.  She has been followed by Dr. Percival Spanish for the past several years.  Cardiac catheterization was performed in 2008 which showed a 60% stenosis of the LAD and 70% in the diagonal branch.  She was treated medically for this.  She has been in her normal state of health until a few months ago at which time the patient developed worsening symptoms of exertional fatigue and intermittent chest discomfort.  These symptoms were also associated with diaphoresis.  She was evaluated on Thanksgiving day in the ED at which time EKG did not show evidence of ischemic changes and her Troponin levels were negative.  She followed up with Dr. Percival Spanish since discharge at which point he felt she should be directly admitted to the hospital to undergo cardiac catheterization.  This was done by Dr. Tamala Julian on 10/30/2015 and showed a high grade stenosis of her LAD which was not amenable to PCI.  Therefore, TCTS consult was obtained.  She was evaluated by Dr. Roxy Manns who was in agreement the patient should undergo coronary bypass procedure.  The risks and benefits of the procedure were explained to the patient and she was agreeable to proceed.    Hospital Course:   Emily Wagner remained chest pain free during hospitalization.  She was taken to the operating room on 11/02/2015.  She underwent off pump Coronary bypass grafting x 1 utilizing LIMA to LAD.  She tolerated the procedure without difficulty and was taken to the SICU in stable condition.  The patient was extubated the evening of surgery.  During her stay in the SICU, the patient made good progress.  Her chest tubes and arterial lines were removed without difficulty.  She was  transferred from ICU to stepdown.  She developed postoperative atrial fibrillation and was subsequently treated with Amiodarone with successful conversion to sinus rhythm. The patient continued to have episodes of atrial fibrillation.  She was continued on oral amiodarone, and started on Lopressor and Coumadin.   She has had profound nausea early postoperatively, which was thought to be related to higher doses of Amiodarone.  This did resolve with a lower dose. She has been hypertensive postop and medications have been titrated as tolerated.  Overall, she is progressing well.  INR is trending upward appropriately.  She is mostly maintaining sinus rhythm and blood pressures are improved. She is ambulating in the halls with assistance.  She is tolerating a heart healthy diet.  The patient is felt to be medically stable for discharge on 11/08/15.       Significant Diagnostic Studies: angiography:    Mid RCA to Dist RCA lesion, 60% stenosed.  Ost LAD to Prox LAD lesion, 75% stenosed.  Prox LAD to Mid LAD lesion, 90% stenosed.  2nd Diag lesion, 75% stenosed.  Ost 1st Diag-2 lesion, 75% stenosed.  Ost 1st Diag-1 lesion, 75% stenosed.  Treatments: surgery:    Off-pump Coronary Artery Bypass Grafting x 1  Left Internal Mammary Artery to Distal Left Anterior Descending Coronary Artery  Disposition: 01-Home or Self Care\   Discharge medications:    Medication List    STOP taking these medications        verapamil 120 MG tablet  Commonly known as:  CALAN      TAKE these medications        amiodarone 200 MG tablet  Commonly known as:  PACERONE  Take 1 tablet (200 mg total) by mouth 2 (two) times daily.     amLODipine 10 MG tablet  Commonly known as:  NORVASC  Take 1 tablet (10 mg total) by mouth daily.     aspirin EC 81 MG tablet  Take 81 mg by mouth daily.     calcium carbonate 600 MG Tabs tablet  Commonly known as:  OS-CAL  Take 600 mg by mouth daily with breakfast.     diphenhydrAMINE 25 MG tablet  Commonly known as:  BENADRYL  Take 12.5 mg by mouth daily.     fexofenadine 180 MG tablet  Commonly known as:  ALLEGRA  Take 180 mg by mouth daily.     hydrochlorothiazide 25 MG tablet  Commonly known as:  HYDRODIURIL  Take 1 tablet (25 mg total) by mouth daily.      levothyroxine 50 MCG tablet  Commonly known as:  SYNTHROID, LEVOTHROID  Take 2 tablets (100 mcg total) by mouth daily.     lisinopril 40 MG tablet  Commonly known as:  PRINIVIL,ZESTRIL  Take 1 tablet (40 mg total) by mouth daily.     LORazepam 0.5 MG tablet  Commonly known as:  ATIVAN  TAKE 1 BY MOUTH EVERY NIGHT FOR SLEEP     metoprolol 50 MG tablet  Commonly known as:  LOPRESSOR  Take 1 tablet (50 mg total) by mouth 3 (three) times daily.     PRESERVISION AREDS PO  Take 1 tablet by mouth daily.     Vitamin D 2000 UNITS Caps  Take 2,000 Units by mouth daily.     warfarin 5 MG tablet  Commonly known as:  COUMADIN  Take 5 mg po tonight, then 2.5 mg on Tuesday. Continue to alternate doses unless otherwise instructed by the Coumadin Clinic  The patient has been discharged on:   1.Beta Blocker:  Yes [ x  ]                              No   [   ]                              If No, reason:  2.Ace Inhibitor/ARB: Yes [ x  ]                                     No  [    ]                                     If No, reason:  3.Statin:   Yes [   ]                  No  [ x  ]                  If No, reason: intolerant  4.Shela CommonsLauro Regulus   ]                  No   [   ]                  If No, reason:   Follow Up: Follow-up Information    Follow up with Rexene Alberts, MD On 12/12/2014.   Specialty:  Cardiothoracic Surgery   Why:  Appointment is at 4:30   Contact information:   [place] [address] [phone]       Follow up with [facility] On 12/12/2014.   Why:  Please get CXR at 4:00   Contact information:   [facility]       Follow up with Rosaria Ferries, PA-C On 11/18/2015.   Specialties:  Cardiology, Radiology   Why:   @ 2pm   Contact information:   [place]  [address] [phone]       Follow up On 11/10/2015.   Why:  Home health RN will draw PT/INR and call results to  the [facility]madin Clinic at Dr. Rosezella [place] office       Signed: Burke Keels 11/08/2015, 9:16 AM

## 2015-11-05 NOTE — Progress Notes (Signed)
Pacing wires have been removed. Pt educated on bed rest and agreed. Pt tolerated it well. Vital signs have been taken. No complaints of chest pain or shortness of breath. Will continue to monitor.

## 2015-11-06 LAB — BASIC METABOLIC PANEL
Anion gap: 8 (ref 5–15)
BUN: 10 mg/dL (ref 6–20)
CALCIUM: 8.5 mg/dL — AB (ref 8.9–10.3)
CO2: 29 mmol/L (ref 22–32)
CREATININE: 0.79 mg/dL (ref 0.44–1.00)
Chloride: 104 mmol/L (ref 101–111)
GFR calc Af Amer: >60 mL/min (ref >60)
GLUCOSE: 105 mg/dL — AB (ref 65–99)
Potassium: 3 mmol/L — ABNORMAL LOW (ref 3.5–5.1)
Sodium: 141 mmol/L (ref 135–145)

## 2015-11-06 LAB — GLUCOSE, CAPILLARY
GLUCOSE-CAPILLARY: 91 mg/dL (ref 65–99)
Glucose-Capillary: 93 mg/dL (ref 65–99)

## 2015-11-06 LAB — CBC
HEMATOCRIT: 37.5 % (ref 36.0–46.0)
Hemoglobin: 12.4 g/dL (ref 12.0–15.0)
MCH: 31.6 pg (ref 26.0–34.0)
MCHC: 33.1 g/dL (ref 30.0–36.0)
MCV: 95.4 fL (ref 78.0–100.0)
PLATELETS: 201 10*3/uL (ref 150–400)
RBC: 3.93 MIL/uL (ref 3.87–5.11)
RDW: 13.6 % (ref 11.5–15.5)
WBC: 10.4 10*3/uL (ref 4.0–10.5)

## 2015-11-06 LAB — MAGNESIUM: Magnesium: 1.8 mg/dL (ref 1.7–2.4)

## 2015-11-06 LAB — PROTIME-INR
INR: 1.13 (ref 0.00–1.49)
Prothrombin Time: 14.7 seconds (ref 11.6–15.2)

## 2015-11-06 MED ORDER — POTASSIUM CHLORIDE CRYS ER 20 MEQ PO TBCR
40.0000 meq | EXTENDED_RELEASE_TABLET | Freq: Two times a day (BID) | ORAL | Status: AC
  Administered 2015-11-06 (×2): 40 meq via ORAL
  Filled 2015-11-06 (×2): qty 2

## 2015-11-06 MED ORDER — HYDROCHLOROTHIAZIDE 12.5 MG PO CAPS
12.5000 mg | ORAL_CAPSULE | Freq: Every day | ORAL | Status: DC
  Administered 2015-11-06: 12.5 mg via ORAL
  Filled 2015-11-06 (×2): qty 1

## 2015-11-06 NOTE — Progress Notes (Signed)
CARDIAC REHAB PHASE I   PRE:  Rate/Rhythm: 70 SR  BP:  Supine:   Sitting: 160/82  Standing:    SaO2: 95 RA  MODE:  Ambulation: 260 ft   POST:  Rate/Rhythm: 85 SR  BP:  Supine:   Sitting: 170/86  Standing:    SaO2: 94 RA Tolerated ambulation well, feels much better today.  Stayed in NSR while walking.  Instructed to walk with staff x 2 today. 1430-1500  Liliane Channel RN, BSN 11/06/2015 2:59 PM

## 2015-11-06 NOTE — Progress Notes (Addendum)
[address]       [place] [address]             [phone]      4 Days Post-Op Procedure(s) (LRB): OFF PUMP CORONARY ARTERY BYPASS GRAFTING (CABG) TIMES ONE (N/A) TRANSESOPHAGEAL ECHOCARDIOGRAM (TEE) (N/A) Subjective: Feels better than she has in several days  Objective: Vital signs in last 24 hours: Temp:  [97.9 F (36.6 C)] 97.9 F (36.6 C) (12/10 0417) Pulse Rate:  [71-76] 75 (12/10 0417) Cardiac Rhythm:  [-] Normal sinus rhythm (12/09 2337) Resp:  [18] 18 (12/10 0417) BP: (150-187)/(84-105) 164/84 mmHg (12/10 0851) SpO2:  [92 %] 92 % (12/10 0417) Weight:  [171 lb 8 oz (77.792 kg)] 171 lb 8 oz (77.792 kg) (12/10 0500)  Hemodynamic parameters for last 24 hours:    Intake/Output from previous day: 12/09 0701 - 12/10 0700 In: 240 [P.O.:240] Out: 200 [Urine:200] Intake/Output this shift:    General appearance: alert, cooperative and no distress Heart: regular rate and rhythm Lungs: clear to auscultation bilaterally Abdomen: benign Extremities: minimal edema Wound: incis haealing well  Lab Results:  Recent Labs  11/04/15 0530 11/06/15 0325  WBC 10.8* 10.4  HGB 12.2 12.4  HCT 36.8 37.5  PLT 123* 201   BMET:  Recent Labs  11/05/15 1333 11/06/15 0325  NA 137 141  K 3.4* 3.0*  CL 104 104  CO2 24 29  GLUCOSE 113* 105*  BUN 10 10  CREATININE 0.77 0.79  CALCIUM 8.5* 8.5*    PT/INR:  Recent Labs  11/06/15 0325  LABPROT 14.7  INR 1.13   ABG    Component Value Date/Time   PHART 7.284* 11/02/2015 1859   HCO3 19.2* 11/02/2015 1859   TCO2 24.0 11/02/2015 1925   ACIDBASEDEF 7.0* 11/02/2015 1859   O2SAT 98.0 11/02/2015 1859   CBG (last 3)   Recent Labs  11/05/15 2035 11/06/15 0033 11/06/15 0426  GLUCAP 106* 91 93    Meds Scheduled Meds: . acetaminophen  1,000 mg Oral 4 times per day  . amiodarone  200 mg Oral BID PC  . antiseptic oral rinse  7 mL Mouth Rinse QID  . aspirin EC  325 mg Oral Daily  . bisacodyl   10 mg Oral Daily   Or  . bisacodyl  10 mg Rectal Daily  . Chlorhexidine Gluconate Cloth  6 each Topical Q0600  . docusate sodium  200 mg Oral Daily  . enoxaparin (LOVENOX) injection  30 mg Subcutaneous QHS  . levothyroxine  100 mcg Oral QAC breakfast  . lisinopril  40 mg Oral Daily  . metoprolol tartrate  50 mg Oral TID  . mupirocin ointment  1 application Nasal BID  . pantoprazole  40 mg Oral Daily  . sodium chloride  3 mL Intravenous Q12H  . sodium chloride  3 mL Intravenous Q12H  . warfarin  2.5 mg Oral q1800  . warfarin   Does not apply Once  . Warfarin - Physician Dosing Inpatient   Does not apply q1800   Continuous Infusions:  PRN Meds:.sodium chloride, diphenhydrAMINE, LORazepam, metoprolol, ondansetron (ZOFRAN) IV, oxyCODONE, sodium chloride, sodium chloride, sodium chloride, traMADol, zolpidem  Xrays No results found.  Assessment/Plan: S/P Procedure(s) (LRB): OFF PUMP CORONARY ARTERY BYPASS GRAFTING (CABG) TIMES ONE (N/A) TRANSESOPHAGEAL ECHOCARDIOGRAM (TEE) (N/A)  1 conts to have afib- now on coumadin, cont amio and beta blocker- replacing K+ 2 BP is poorly controlled still- will add HCTZ to current regimin 3 cont  pulm toilet and rehab  LOS: 8 days    GOLD,WAYNE E 11/06/2015  Recurrent afib, currently in sinus Started on coumadin I have seen and examined [name] and agree with the above assessment  and plan.  Grace Isaac MD Beeper [phone] Office [phone] 11/06/2015 11:46 AM

## 2015-11-07 LAB — BASIC METABOLIC PANEL
ANION GAP: 7 (ref 5–15)
BUN: 17 mg/dL (ref 6–20)
CHLORIDE: 104 mmol/L (ref 101–111)
CO2: 29 mmol/L (ref 22–32)
CREATININE: 0.91 mg/dL (ref 0.44–1.00)
Calcium: 8.6 mg/dL — ABNORMAL LOW (ref 8.9–10.3)
GFR, EST NON AFRICAN AMERICAN: 58 mL/min — AB (ref >60)
Glucose, Bld: 101 mg/dL — ABNORMAL HIGH (ref 65–99)
POTASSIUM: 3.6 mmol/L (ref 3.5–5.1)
SODIUM: 140 mmol/L (ref 135–145)

## 2015-11-07 LAB — PROTIME-INR
INR: 1.22 (ref 0.00–1.49)
Prothrombin Time: 15.5 seconds — ABNORMAL HIGH (ref 11.6–15.2)

## 2015-11-07 MED ORDER — HYDROCHLOROTHIAZIDE 25 MG PO TABS
25.0000 mg | ORAL_TABLET | Freq: Every day | ORAL | Status: DC
  Administered 2015-11-07 – 2015-11-08 (×2): 25 mg via ORAL
  Filled 2015-11-07: qty 1

## 2015-11-07 MED ORDER — POTASSIUM CHLORIDE CRYS ER 20 MEQ PO TBCR
40.0000 meq | EXTENDED_RELEASE_TABLET | Freq: Once | ORAL | Status: AC
  Administered 2015-11-07: 40 meq via ORAL
  Filled 2015-11-07: qty 2

## 2015-11-07 MED ORDER — FUROSEMIDE 40 MG PO TABS
40.0000 mg | ORAL_TABLET | Freq: Once | ORAL | Status: AC
  Administered 2015-11-07: 40 mg via ORAL

## 2015-11-07 MED ORDER — MAGNESIUM OXIDE 400 (241.3 MG) MG PO TABS
400.0000 mg | ORAL_TABLET | Freq: Two times a day (BID) | ORAL | Status: DC
  Administered 2015-11-07 – 2015-11-08 (×3): 400 mg via ORAL
  Filled 2015-11-07 (×4): qty 1

## 2015-11-07 MED ORDER — ASPIRIN EC 81 MG PO TBEC
81.0000 mg | DELAYED_RELEASE_TABLET | Freq: Every day | ORAL | Status: DC
  Administered 2015-11-07 – 2015-11-08 (×2): 81 mg via ORAL
  Filled 2015-11-07: qty 1

## 2015-11-07 MED ORDER — AMLODIPINE BESYLATE 5 MG PO TABS
5.0000 mg | ORAL_TABLET | Freq: Every day | ORAL | Status: DC
  Administered 2015-11-07: 5 mg via ORAL
  Filled 2015-11-07: qty 1

## 2015-11-07 NOTE — Progress Notes (Addendum)
[address]       [place], [address]             [phone]      5 Days Post-Op Procedure(s) (LRB): OFF PUMP CORONARY ARTERY BYPASS GRAFTING (CABG) TIMES ONE (N/A) TRANSESOPHAGEAL ECHOCARDIOGRAM (TEE) (N/A) Subjective: HTN still poorly controlled, afib persists(PAF) Does cont to feel better  Objective: Vital signs in last 24 hours: Temp:  [97.5 F (36.4 C)-98.4 F (36.9 C)] 97.7 F (36.5 C) (12/11 0304) Pulse Rate:  [64-72] 69 (12/11 0304) Cardiac Rhythm:  [-] Normal sinus rhythm (12/11 0433) Resp:  [18] 18 (12/11 0304) BP: (162-187)/(70-91) 162/91 mmHg (12/11 0433) SpO2:  [95 %-97 %] 97 % (12/11 0304) Weight:  [164 lb 9.6 oz (74.662 kg)] 164 lb 9.6 oz (74.662 kg) (12/11 0304)  Hemodynamic parameters for last 24 hours:    Intake/Output from previous day: 12/10 0701 - 12/11 0700 In: -  Out: 650 [Urine:650] Intake/Output this shift:    General appearance: alert, cooperative and no distress Heart: regular rate and rhythm and + extrasystoles Lungs: dim in bases, mildly Abdomen: benign Extremities: minor edema Wound: incis healing well  Lab Results:  Recent Labs  11/06/15 0325  WBC 10.4  HGB 12.4  HCT 37.5  PLT 201   BMET:  Recent Labs  11/06/15 0325 11/07/15 0407  NA 141 140  K 3.0* 3.6  CL 104 104  CO2 29 29  GLUCOSE 105* 101*  BUN 10 17  CREATININE 0.79 0.91  CALCIUM 8.5* 8.6*    PT/INR:  Recent Labs  11/07/15 0407  LABPROT 15.5*  INR 1.22   ABG    Component Value Date/Time   PHART 7.284* 11/02/2015 1859   HCO3 19.2* 11/02/2015 1859   TCO2 24.0 11/02/2015 1925   ACIDBASEDEF 7.0* 11/02/2015 1859   O2SAT 98.0 11/02/2015 1859   CBG (last 3)   Recent Labs  11/05/15 2035 11/06/15 0033 11/06/15 0426  GLUCAP 106* 91 93    Meds Scheduled Meds: . acetaminophen  1,000 mg Oral 4 times per day  . amiodarone  200 mg Oral BID PC  . antiseptic oral rinse  7 mL Mouth Rinse QID  . aspirin EC  325 mg Oral Daily    . bisacodyl  10 mg Oral Daily   Or  . bisacodyl  10 mg Rectal Daily  . docusate sodium  200 mg Oral Daily  . enoxaparin (LOVENOX) injection  30 mg Subcutaneous QHS  . hydrochlorothiazide  12.5 mg Oral Daily  . levothyroxine  100 mcg Oral QAC breakfast  . lisinopril  40 mg Oral Daily  . metoprolol tartrate  50 mg Oral TID  . pantoprazole  40 mg Oral Daily  . sodium chloride  3 mL Intravenous Q12H  . sodium chloride  3 mL Intravenous Q12H  . warfarin  2.5 mg Oral q1800  . warfarin   Does not apply Once  . Warfarin - Physician Dosing Inpatient   Does not apply q1800   Continuous Infusions:  PRN Meds:.sodium chloride, diphenhydrAMINE, LORazepam, metoprolol, ondansetron (ZOFRAN) IV, oxyCODONE, sodium chloride, sodium chloride, sodium chloride, traMADol, zolpidem  Xrays No results found.  Assessment/Plan: S/P Procedure(s) (LRB): OFF PUMP CORONARY ARTERY BYPASS GRAFTING (CABG) TIMES ONE (N/A) TRANSESOPHAGEAL ECHOCARDIOGRAM (TEE) (N/A)  1 HTN - poorly controlled- on beta blocker, ACEI, Diuretic- will increase dose of HCTZ and add low dose norvasc 2 rhythm issues  persist- cont amio and coumadin, cont to replace K+ , replace  magnesium, recheck TSH     LOS: 9 days    GOLD,WAYNE E 11/07/2015  Still in and out of a fib on coumdin 2.5 mg day  inr 1.2 today  Follow up chest xray , previous bilaterial effusions I have seen and examined [name] and agree with the above assessment  and plan.  Grace Isaac MD Beeper [phone] Office [phone] 11/07/2015 9:46 AM

## 2015-11-08 ENCOUNTER — Inpatient Hospital Stay (HOSPITAL_COMMUNITY): Payer: Medicare Other

## 2015-11-08 LAB — PROTIME-INR
INR: 1.35 (ref 0.00–1.49)
Prothrombin Time: 16.8 seconds — ABNORMAL HIGH (ref 11.6–15.2)

## 2015-11-08 LAB — CBC
HCT: 40.2 % (ref 36.0–46.0)
Hemoglobin: 13 g/dL (ref 12.0–15.0)
MCH: 30.7 pg (ref 26.0–34.0)
MCHC: 32.3 g/dL (ref 30.0–36.0)
MCV: 95 fL (ref 78.0–100.0)
Platelets: 251 10*3/uL (ref 150–400)
RBC: 4.23 MIL/uL (ref 3.87–5.11)
RDW: 13.4 % (ref 11.5–15.5)
WBC: 7.6 10*3/uL (ref 4.0–10.5)

## 2015-11-08 LAB — BASIC METABOLIC PANEL
Anion gap: 10 (ref 5–15)
BUN: 16 mg/dL (ref 6–20)
CHLORIDE: 96 mmol/L — AB (ref 101–111)
CO2: 33 mmol/L — AB (ref 22–32)
CREATININE: 1.01 mg/dL — AB (ref 0.44–1.00)
Calcium: 8.9 mg/dL (ref 8.9–10.3)
GFR calc non Af Amer: 51 mL/min — ABNORMAL LOW (ref >60)
GFR, EST AFRICAN AMERICAN: 59 mL/min — AB (ref >60)
Glucose, Bld: 103 mg/dL — ABNORMAL HIGH (ref 65–99)
Potassium: 2.8 mmol/L — ABNORMAL LOW (ref 3.5–5.1)
Sodium: 139 mmol/L (ref 135–145)

## 2015-11-08 LAB — TSH: TSH: 2.845 u[IU]/mL (ref 0.350–4.500)

## 2015-11-08 MED ORDER — HYDROCHLOROTHIAZIDE 25 MG PO TABS: 25.0000 mg | ORAL_TABLET | Freq: Every day | ORAL | Status: DC

## 2015-11-08 MED ORDER — WARFARIN SODIUM 5 MG PO TABS: ORAL_TABLET | ORAL | Status: DC

## 2015-11-08 MED ORDER — AMLODIPINE BESYLATE 10 MG PO TABS: 10.0000 mg | ORAL_TABLET | Freq: Every day | ORAL | Status: DC

## 2015-11-08 MED ORDER — AMIODARONE HCL 200 MG PO TABS: 200.0000 mg | ORAL_TABLET | Freq: Two times a day (BID) | ORAL | Status: DC

## 2015-11-08 MED ORDER — METOPROLOL TARTRATE 50 MG PO TABS
50.0000 mg | ORAL_TABLET | Freq: Three times a day (TID) | ORAL | Status: DC
  Administered 2015-11-08: 50 mg via ORAL
  Filled 2015-11-08: qty 1

## 2015-11-08 MED ORDER — POTASSIUM CHLORIDE CRYS ER 20 MEQ PO TBCR
40.0000 meq | EXTENDED_RELEASE_TABLET | Freq: Two times a day (BID) | ORAL | Status: DC
  Administered 2015-11-08: 40 meq via ORAL
  Filled 2015-11-08: qty 2

## 2015-11-08 MED ORDER — AMLODIPINE BESYLATE 10 MG PO TABS
10.0000 mg | ORAL_TABLET | Freq: Every day | ORAL | Status: DC
  Administered 2015-11-08: 10 mg via ORAL
  Filled 2015-11-08: qty 1

## 2015-11-08 MED ORDER — METOPROLOL TARTRATE 100 MG PO TABS: 100.0000 mg | ORAL_TABLET | Freq: Two times a day (BID) | ORAL | Status: DC

## 2015-11-08 MED ORDER — LISINOPRIL 40 MG PO TABS: 40.0000 mg | ORAL_TABLET | Freq: Every day | ORAL | Status: DC

## 2015-11-08 MED ORDER — LORAZEPAM 0.5 MG PO TABS: ORAL_TABLET | ORAL | Status: DC

## 2015-11-08 MED ORDER — METOPROLOL TARTRATE 50 MG PO TABS: 50.0000 mg | ORAL_TABLET | Freq: Three times a day (TID) | ORAL | Status: DC

## 2015-11-08 MED ORDER — WARFARIN SODIUM 5 MG PO TABS: 5.0000 mg | ORAL_TABLET | Freq: Every day | ORAL | Status: DC

## 2015-11-08 NOTE — Progress Notes (Addendum)
CARDIAC REHAB PHASE I   PRE:  Rate/Rhythm: 75 SR    BP: sitting 157/88    SaO2: 95 RA  MODE:  Ambulation: 450 ft   POST:  Rate/Rhythm: 95 SR with PVC    BP: sitting 165/88     SaO2: 95 RA  Tolerated well without afib. Increased distance, no c/o. Return to recliner. Ed completed. Gave afib book and videos to watch regarding Coumadin and discharge however pt does not want to watch them. Pt declined CRPII as she cannot drive to [place]. [id number]   Emily Wagner [facility] CES, ACSM 11/08/2015 9:49 AM

## 2015-11-08 NOTE — Care Management Note (Addendum)
Case Management Note Marvetta Gibbons RN, BSN Unit 2W-Case Manager [phone]  Patient Details  Name: Emily Wagner MRN: [id number] Date of Birth: [birth year]  Subjective/Objective:  Pt admitted with [place]- Catheterization performed by Dr. Tamala Julian demonstrates high-grade proximal stenosis of the left anterior descending coronary artery with anatomical characteristics that are felt to be relatively unfavorable for PCI and stenting-- plan for CABG on 11/02/15                 Update 11/08/2015 Pt will discharge home with [facility] per MD order.  Cardiac Rehab at bedside and verified that pt doesn't require additional [facility] services nor equipment.  Action/Plan: PTA pt lived at home with disabled daughter who requires pt to physically assist her. Pt has remained very active- still drives, cooks and takes care of household chores. Pt does use a cane for stability. CM will follow post op for progression- this may be a difficult situation with pt's physical limitations post op.   Expected Discharge Date:           Expected Discharge Plan:  [place]  In-House Referral:     Discharge planning Services  CM Consult  Post Acute Care Choice:    Choice offered to:   Patient  DME Arranged:    DME Agency:     HH Arranged:   RN [place] Agency:   [facility]  Status of Service:  Complete, will sign off  Medicare Important Message Given:  Yes Date Medicare IM Given:    Medicare IM give by:    Date Additional Medicare IM Given:    Additional Medicare Important Message give by:     If discussed at [place] of Stay Meetings, dates discussed:    Additional Comments: CM offerred choice, pt chose [facility], agency contacted and referral accepted Maryclare Labrador, RN 11/08/2015, 9:53 AM

## 2015-11-08 NOTE — Progress Notes (Addendum)
       [address]       [place] [address]             [phone]          6 Days Post-Op Procedure(s) (LRB): OFF PUMP CORONARY ARTERY BYPASS GRAFTING (CABG) TIMES ONE (N/A) TRANSESOPHAGEAL ECHOCARDIOGRAM (TEE) (N/A)  Subjective: "I'm ready to go home." Feels well this am, no complaints. Continues to have runs of AF, mostly with controlled rates.   Objective: Vital signs in last 24 hours: Patient Vitals for the past 24 hrs:  BP Temp Temp src Pulse Resp SpO2 Weight  11/08/15 0500 - - - - - - 149 lb (67.586 kg)  11/08/15 0401 (!) 151/81 mmHg 97.8 F (36.6 C) Oral 63 18 91 % -  11/07/15 2041 (!) 157/63 mmHg - - - - - -  11/07/15 1940 (!) 183/82 mmHg 98.1 F (36.7 C) Oral 67 18 95 % -  11/07/15 1430 (!) 163/72 mmHg - - - - - -   Current Weight  11/08/15 149 lb (67.586 kg)  BASELINE WEIGHT:72 kg   Intake/Output from previous day: 12/11 0701 - 12/12 0700 In: 480 [P.O.:480] Out: 2075 [Urine:2075]    PHYSICAL EXAM:  Heart: RRR Lungs: Clear Wound: Clean and dry Extremities: No significant LE edema    Lab Results: CBC: Recent Labs  11/06/15 0325 11/08/15 0610  WBC 10.4 7.6  HGB 12.4 13.0  HCT 37.5 40.2  PLT 201 251   BMET:  Recent Labs  11/07/15 0407 11/08/15 0610  NA 140 139  K 3.6 2.8*  CL 104 96*  CO2 29 33*  GLUCOSE 101* 103*  BUN 17 16  CREATININE 0.91 1.01*  CALCIUM 8.6* 8.9    PT/INR:  Recent Labs  11/08/15 0610  LABPROT 16.8*  INR 1.35   TSH 2.845   Assessment/Plan: S/P Procedure(s) (LRB): OFF PUMP CORONARY ARTERY BYPASS GRAFTING (CABG) TIMES ONE (N/A) TRANSESOPHAGEAL ECHOCARDIOGRAM (TEE) (N/A)  CV- Continues to have intermittent runs of AF, asymptomatic. Rates 60s when she is in sinus.  BPs a little better. Continue Lopressor, Lisinopril, Amio, Norvasc, Coumadin load. Not really a lot of room to increase beta blocker due to heart rate.  Hypokalemia- replace K.  CRPI, pulm toilet.  Hopefully home  soon once rhythm stable.   LOS: 10 days    COLLINS,GINA H 11/08/2015  I have seen and examined the patient and agree with the assessment and plan as outlined.  Maintaining NSR most of the time but still having some episodes of PAF.  BP still a bit elevated despite adding Norvasc.  Will increase Norvasc to 10 mg/day and increase Coumadin to 5 mg today then alternate 5 mg with 2.5 mg.  Continue amiodarone 200 mg bid, metoprolol 50 mg tid, lisinopril 40 mg/day and hydrodiuril 25 mg/day.  D/C home today with home health nursing care.  Patient instructed to check BP twice daily and schedule an appt with Dr. Laurance Flatten in 1-2 weeks.  Will have home health nursing draw INR on Thursday and call/fax results to [facility].  Rexene Alberts, MD 11/08/2015 8:44 AM

## 2015-11-08 NOTE — Progress Notes (Signed)
Chest tube sutures removed per order and per protocol. Painted with benzoin and steri-strips applied. Pt tolerated well. Will continue to monitor.

## 2015-11-08 NOTE — Care Management Important Message (Signed)
Important Message  Patient Details  Name: Emily Wagner MRN: [id number] Date of Birth: [birth year]   Medicare Important Message Given:  Yes    Maryclare Labrador, RN 11/08/2015, 10:46 AM

## 2015-11-08 NOTE — Progress Notes (Signed)
Patient ambulated 300 ft with minimal assistance using a RW, patient tolerated well, VSS. Patient back in bed, call bell within reach.

## 2015-11-09 ENCOUNTER — Telehealth: Payer: Self-pay | Admitting: Family Medicine

## 2015-11-09 DIAGNOSIS — Z48812 Encounter for surgical aftercare following surgery on the circulatory system: Secondary | ICD-10-CM | POA: Diagnosis not present

## 2015-11-09 MED FILL — Dexmedetomidine HCl in NaCl 0.9% IV Soln 400 MCG/100ML: INTRAVENOUS | Qty: 100 | Status: AC

## 2015-11-09 NOTE — Telephone Encounter (Signed)
Call Completed and Appointment Scheduled: Yes, Date: 11/15/15 with Dr Anabel Halon INFORMATION Date of Discharge:11/08/15   Discharge Facility: [facility]  Principal Discharge Diagnosis: Unstable angina. Underwent a CABG on 10/29/15  Patient and/or caregiver is knowledgeable of his/her condition(s) and treatment: Yes   MEDICATION RECONCILIATION Medication list reviewed with patient: Yes  Patient is able to obtain needed medications: Yes   ACTIVITIES OF DAILY LIVING  Is the patient able to perform his/her own ADLs: Yes  Patient is receiving home health services: yes Home Health Nurse coming once weekly to check protime   PATIENT EDUCATION Questions/Concerns Discussed: Patient has some weakness but seems to be improving. She has a home health nurse that is coming on Thursday to an assessment and her protime. She will call if she needs anything before her appt on 11/15/15.

## 2015-11-10 ENCOUNTER — Ambulatory Visit: Payer: Medicare Other | Admitting: Family Medicine

## 2015-11-11 ENCOUNTER — Ambulatory Visit (INDEPENDENT_AMBULATORY_CARE_PROVIDER_SITE_OTHER): Payer: Medicare Other | Admitting: Pharmacist Clinician (PhC)/ Clinical Pharmacy Specialist

## 2015-11-11 ENCOUNTER — Telehealth: Payer: Self-pay | Admitting: Cardiology

## 2015-11-11 DIAGNOSIS — Z7901 Long term (current) use of anticoagulants: Secondary | ICD-10-CM

## 2015-11-11 LAB — POCT INR: INR: 2.5

## 2015-11-11 NOTE — Telephone Encounter (Signed)
Malachy Mood calling to give home lab draw results:  INR 2.5 PT 30.5  Routing to [facility] for dosing recommendations.

## 2015-11-15 ENCOUNTER — Encounter: Payer: Self-pay | Admitting: Family Medicine

## 2015-11-15 ENCOUNTER — Ambulatory Visit (INDEPENDENT_AMBULATORY_CARE_PROVIDER_SITE_OTHER): Payer: Medicare Other | Admitting: Family Medicine

## 2015-11-15 ENCOUNTER — Ambulatory Visit (INDEPENDENT_AMBULATORY_CARE_PROVIDER_SITE_OTHER): Payer: Medicare Other | Admitting: Pharmacist Clinician (PhC)/ Clinical Pharmacy Specialist

## 2015-11-15 VITALS — BP 91/65 | HR 55 | Temp 97.0°F | Ht 64.0 in | Wt 153.0 lb

## 2015-11-15 DIAGNOSIS — Z951 Presence of aortocoronary bypass graft: Secondary | ICD-10-CM | POA: Diagnosis not present

## 2015-11-15 DIAGNOSIS — R531 Weakness: Secondary | ICD-10-CM

## 2015-11-15 DIAGNOSIS — Z7901 Long term (current) use of anticoagulants: Secondary | ICD-10-CM

## 2015-11-15 DIAGNOSIS — N289 Disorder of kidney and ureter, unspecified: Secondary | ICD-10-CM | POA: Diagnosis not present

## 2015-11-15 LAB — POCT INR: INR: 4.1

## 2015-11-15 NOTE — Telephone Encounter (Signed)
Received call from [facility] with [place] calling to report INR 4.1 PT 48.8.Message sent to [employer]

## 2015-11-15 NOTE — Progress Notes (Signed)
Subjective:    Patient ID: Emily Wagner, female    DOB: 07-05-1933, 81 y.o.   MRN: [id number]  HPI Patient here today for hospital follow up. She was at [facility] and was discharged last Monday.  She has been home for 1 week. Her biggest complaint currently is weakness. She plans to see the physician assistant that works with Dr. Michelle Wagner  In 3 days. She has an appointment to see the vascular surgeon on January 16. The patient had the bypass surgery 2 weeks ago. She came home one week ago. She is getting pro times done and followed currently by Emily Wagner. She is eating well her physical and down well she is not having any problems with constipation or her bowel movements. She says she is voiding without issues.      Patient Active Problem List   Diagnosis Date Noted  . Long term (current) use of anticoagulants 11/11/2015  . Atrial fibrillation ([place]) 11/04/2015  . 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 Infection of urinary tract 10/31/2015  . Unstable angina ([place]) 10/29/2015  . CAD (coronary artery disease) 10/29/2015  . Benign hypertensive heart disease without heart failure   . Interstitial cystitis   . Osteopenia   . Pelvic fracture ([place]) 05/30/2015  . Allergy to multiple medications 07/15/2013  . Renal insufficiency   . Adjustment disorder with mixed anxiety and depressed mood   . Osteoporosis   . ASCVD (arteriosclerotic cardiovascular disease)   . 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  . HYPERCHOLESTEROLEMIA 05/02/2008  . Essential hypertension 05/02/2008  . INTERSTITIAL CYSTITIS 05/02/2008  . DIVERTICULOSIS OF COLON 08/27/2003   Outpatient Encounter Prescriptions as of 11/15/2015  Medication Sig  . amiodarone (PACERONE) 200 MG tablet Take 1 tablet (200 mg total) by mouth 2 (two) times daily.  Marland Kitchen amLODipine (NORVASC) 10 MG tablet Take 1 tablet (10 mg total) by mouth daily.  Marland Kitchen aspirin EC 81 MG tablet Take 81 mg by mouth daily.  . calcium carbonate (OS-CAL) 600 MG TABS tablet Take  600 mg by mouth daily with breakfast.  . Cholecalciferol (VITAMIN D) 2000 UNITS CAPS Take 2,000 Units by mouth daily.   . diphenhydrAMINE (BENADRYL) 25 MG tablet Take 12.5 mg by mouth daily.   . fexofenadine (ALLEGRA) 180 MG tablet Take 180 mg by mouth daily.  . hydrochlorothiazide (HYDRODIURIL) 25 MG tablet Take 1 tablet (25 mg total) by mouth daily.  Marland Kitchen levothyroxine (SYNTHROID, LEVOTHROID) 50 MCG tablet Take 2 tablets (100 mcg total) by mouth daily.  Marland Kitchen lisinopril (PRINIVIL,ZESTRIL) 40 MG tablet Take 1 tablet (40 mg total) by mouth daily.  Marland Kitchen LORazepam (ATIVAN) 0.5 MG tablet TAKE 1 BY MOUTH EVERY NIGHT FOR SLEEP  . metoprolol (LOPRESSOR) 50 MG tablet Take 1 tablet (50 mg total) by mouth 3 (three) times daily.  . Multiple Vitamins-Minerals (PRESERVISION AREDS PO) Take 1 tablet by mouth daily.  Marland Kitchen warfarin (COUMADIN) 5 MG tablet Take 5 mg po tonight, then 2.5 mg on Tuesday. Continue to alternate doses unless otherwise instructed by the Coumadin Clinic   No facility-administered encounter medications on file as of 11/15/2015.      Review of Systems  HENT: Negative.   Eyes: Negative.   Respiratory: Negative.   Cardiovascular: Negative.   Gastrointestinal: Negative.   Endocrine: Negative.   Genitourinary: Negative.   Musculoskeletal: Negative.   Skin: Negative.   Allergic/Immunologic: Negative.   Neurological: Positive for weakness.  Hematological: Negative.   Psychiatric/Behavioral: Negative.  Objective:   Physical Exam  Constitutional: She is oriented to person, place, and time. She appears well-developed and well-nourished. No distress.  The patient is calm and alert she says she is getting her strength back slowly. She is using a walker.  HENT:  Head: Normocephalic and atraumatic.  Right Ear: External ear normal.  Left Ear: External ear normal.  Nose: Nose normal.  Mouth/Throat: Oropharynx is clear and moist. No oropharyngeal exudate.  Eyes: Conjunctivae and EOM are  normal. Pupils are equal, round, and reactive to light. Right eye exhibits no discharge. Left eye exhibits no discharge. No scleral icterus.  Neck: Normal range of motion. Neck supple. No thyromegaly present.  Neck without bruits or thyromegaly  Cardiovascular: Normal rate, regular rhythm and normal heart sounds.   No murmur heard. At 60/m with a regular rate and rhythm  Pulmonary/Chest: Effort normal and breath sounds normal. No respiratory distress. She has no wheezes. She has no rales. She exhibits no tenderness.  The incision is healing well and there is no sign of any drainage or erythema. Lungs are clear anteriorly and posteriorly  Abdominal: Soft. Bowel sounds are normal. She exhibits no mass. There is no tenderness. There is no rebound and no guarding.  Musculoskeletal: She exhibits no edema.  Her range of motion and walking is somewhat hesitant just because of the soreness in her chest wall.  Lymphadenopathy:    She has no cervical adenopathy.  Neurological: She is alert and oriented to person, place, and time.  Skin: Skin is warm and dry. No rash noted. There is pallor.  Psychiatric: She has a normal mood and affect. Her behavior is normal. Judgment and thought content normal.  Nursing note and vitals reviewed.  BP 91/65 mmHg  Pulse 55  Temp(Src) 97 F (36.1 C) (Oral)  Ht 5\' 4"  (1.626 m)  Wt 153 lb (69.4 kg)  BMI 26.25 kg/m2  LMP 03/21/1971        Assessment & Plan:  1. S/P off-pump CABG x 1 -follow up with cardiology in 3 days -Follow up with vascular surgeon in mid January  2. Renal insufficiency -BMP today  3. Weakness -CBC today  Patient Instructions   Continue follow-up with cardiologist and vascular surgeon as planned  Follow their directions  Gradually increase her activities as they recommend  Continue to drink plenty of fluids stay well hydrated   Arrie Senate MD

## 2015-11-15 NOTE — Telephone Encounter (Signed)
See anticoag note

## 2015-11-15 NOTE — Patient Instructions (Signed)
Continue follow-up with cardiologist and vascular surgeon as planned  Follow their directions  Gradually increase her activities as they recommend  Continue to drink plenty of fluids stay well hydrated

## 2015-11-16 NOTE — Addendum Note (Signed)
Addended by: Zannie Cove on: 11/16/2015 11:48 AM   Modules accepted: Orders

## 2015-11-17 LAB — CBC WITH DIFFERENTIAL/PLATELET
BASOS: 0 %
Basophils Absolute: 0 10*3/uL (ref 0.0–0.2)
EOS (ABSOLUTE): 0.1 10*3/uL (ref 0.0–0.4)
Eos: 1 %
Hematocrit: 41.2 % (ref 34.0–46.6)
Hemoglobin: 14.1 g/dL (ref 11.1–15.9)
IMMATURE GRANS (ABS): 0 10*3/uL (ref 0.0–0.1)
Immature Granulocytes: 0 %
LYMPHS ABS: 1.6 10*3/uL (ref 0.7–3.1)
Lymphs: 16 %
MCH: 30.9 pg (ref 26.6–33.0)
MCHC: 34.2 g/dL (ref 31.5–35.7)
MCV: 90 fL (ref 79–97)
MONOS ABS: 0.8 10*3/uL (ref 0.1–0.9)
Monocytes: 9 %
NEUTROS ABS: 7 10*3/uL (ref 1.4–7.0)
Neutrophils: 74 %
Platelets: 474 10*3/uL — ABNORMAL HIGH (ref 150–379)
RBC: 4.56 x10E6/uL (ref 3.77–5.28)
RDW: 13.7 % (ref 12.3–15.4)
WBC: 9.6 10*3/uL (ref 3.4–10.8)

## 2015-11-17 LAB — BMP8+EGFR
BUN / CREAT RATIO: 23 (ref 11–26)
BUN: 27 mg/dL (ref 8–27)
CO2: 25 mmol/L (ref 18–29)
Calcium: 8.7 mg/dL (ref 8.7–10.3)
Chloride: 101 mmol/L (ref 96–106)
Creatinine, Ser: 1.19 mg/dL — ABNORMAL HIGH (ref 0.57–1.00)
GFR, EST AFRICAN AMERICAN: 50 mL/min/{1.73_m2} — AB (ref >59)
GFR, EST NON AFRICAN AMERICAN: 43 mL/min/{1.73_m2} — AB (ref >59)
Glucose: 89 mg/dL (ref 65–99)
POTASSIUM: 4 mmol/L (ref 3.5–5.2)
SODIUM: 144 mmol/L (ref 134–144)

## 2015-11-17 LAB — HEPATIC FUNCTION PANEL
ALBUMIN: 4.2 g/dL (ref 3.5–4.7)
ALK PHOS: 127 IU/L — AB (ref 39–117)
ALT: 8 IU/L (ref 0–32)
AST: 15 IU/L (ref 0–40)
BILIRUBIN TOTAL: 0.3 mg/dL (ref 0.0–1.2)
Bilirubin, Direct: 0.1 mg/dL (ref 0.00–0.40)
Total Protein: 7.1 g/dL (ref 6.0–8.5)

## 2015-11-17 NOTE — Anesthesia Postprocedure Evaluation (Signed)
Anesthesia Post Note  Patient: Emily Wagner  Procedure(s) Performed: Procedure(s) (LRB): OFF PUMP CORONARY ARTERY BYPASS GRAFTING (CABG) TIMES ONE (N/A) TRANSESOPHAGEAL ECHOCARDIOGRAM (TEE) (N/A)  Patient location during evaluation: SICU Anesthesia Type: General Level of consciousness: patient remains intubated per anesthesia plan Vital Signs Assessment: post-procedure vital signs reviewed and stable Respiratory status: patient on ventilator - see flowsheet for VS Cardiovascular status: blood pressure returned to baseline Anesthetic complications: no    Last Vitals:  Filed Vitals:   11/07/15 2041 11/08/15 0401  BP: 157/63 151/81  Pulse:  63  Temp:  36.6 C  Resp:  18    Last Pain:  Filed Vitals:   11/08/15 1432  PainSc: 2                  Sheenah Dimitroff COKER

## 2015-11-18 ENCOUNTER — Encounter: Payer: Self-pay | Admitting: Physician Assistant

## 2015-11-18 ENCOUNTER — Ambulatory Visit (INDEPENDENT_AMBULATORY_CARE_PROVIDER_SITE_OTHER): Payer: Medicare Other | Admitting: Physician Assistant

## 2015-11-18 ENCOUNTER — Ambulatory Visit (INDEPENDENT_AMBULATORY_CARE_PROVIDER_SITE_OTHER): Payer: Medicare Other | Admitting: Pharmacist Clinician (PhC)/ Clinical Pharmacy Specialist

## 2015-11-18 VITALS — BP 106/64 | HR 58 | Ht 64.0 in | Wt 156.4 lb

## 2015-11-18 DIAGNOSIS — I255 Ischemic cardiomyopathy: Secondary | ICD-10-CM

## 2015-11-18 DIAGNOSIS — R0609 Other forms of dyspnea: Secondary | ICD-10-CM

## 2015-11-18 DIAGNOSIS — Z79899 Other long term (current) drug therapy: Secondary | ICD-10-CM | POA: Diagnosis not present

## 2015-11-18 DIAGNOSIS — Z7901 Long term (current) use of anticoagulants: Secondary | ICD-10-CM

## 2015-11-18 DIAGNOSIS — E785 Hyperlipidemia, unspecified: Secondary | ICD-10-CM | POA: Diagnosis not present

## 2015-11-18 LAB — POCT INR: INR: 3

## 2015-11-18 NOTE — Progress Notes (Signed)
Cardiology Office Note   Date:  11/18/2015   ID:  Adamarie Carrubba, DOB 1933/02/12, MRN [id number]  PCP:  Redge Gainer, MD  Cardiologist:  Dr Mardene Celeste, PA-C   Chief Complaint  Patient presents with  . Post CABG    patient reports pain in right arm and upper chest, lack of energy, and wants to come off coumadin    History of Present Illness: Shona Prevo is a 81 y.o. female with a history of CAD (previously non-obstructive), HTN, HL. USAP>cath>off-pump CABG w/ LIMA-LAD, d/c 12/12.  [name] presents for post hospital follow-up  Since discharge from the hospital, she has done pretty well. She is frustrated over her lack of energy and feels that her strength is not building very fast. However, she admits that she never really got her strength back after her pelvic fracture in July.  She has had chest pain from the incision, but not angina. She does not have edema when she wakes up in the morning but will get it during the day. She does not feel her weight has trended up. She does not have orthopnea or PND. She does have dyspnea on exertion and feels that she cannot do that much walking without getting very tired and short of breath.  She dislikes being on Coumadin once to come off. She is tracking her blood pressure and her blood pressures are improved over her preadmission pressures. However, she is frustrated because she is on 4 different medications to help control it.   Past Medical History  Diagnosis Date  . Other and unspecified hyperlipidemia   . Benign hypertensive heart disease without heart failure   . Adjustment disorder with mixed anxiety and depressed mood   . Other dyspnea and respiratory abnormality   . Interstitial cystitis   . Osteopenia   . CAD (coronary artery disease)   . Pelvic fracture ([place]) 05/30/2015  . Infection of urinary tract 10/31/2015  . 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 Atrial fibrillation ([place])  11/04/2015    Post-operative    Past Surgical History  Procedure Laterality Date  . Fracture surgery      Right hip  . Cholecystectomy    . Hernia repair      Right inguinal  . Abdominal hysterectomy      Cancer and endometriosis  . Cataract extraction    . Cardiac catheterization  2008  . Cardiac catheterization N/A 10/29/2015    Procedure: Left Heart Cath and Coronary Angiography;  Surgeon: Belva Crome, MD;  Location: [facility];  Service: Cardiovascular;  Laterality: N/A;  . Coronary artery bypass graft N/A 11/02/2015    Procedure: OFF PUMP CORONARY ARTERY BYPASS GRAFTING (CABG) TIMES ONE;  Surgeon: Rexene Alberts, MD;  Location: [facility];  Service: Open Heart Surgery;  Laterality: N/A;  LIMA to LAD  . Tee without cardioversion N/A 11/02/2015    Procedure: TRANSESOPHAGEAL ECHOCARDIOGRAM (TEE);  Surgeon: Rexene Alberts, MD;  Location: [facility];  Service: Open Heart Surgery;  Laterality: N/A;    Current Outpatient Prescriptions  Medication Sig Dispense Refill  . amiodarone (PACERONE) 200 MG tablet Take 1 tablet (200 mg total) by mouth 2 (two) times daily. 60 tablet 1  . amLODipine (NORVASC) 10 MG tablet Take 1 tablet (10 mg total) by mouth daily. 30 tablet 1  . aspirin EC 81 MG tablet Take 81 mg by mouth  daily.    . calcium carbonate (OS-CAL) 600 MG TABS tablet Take 600 mg by mouth daily with breakfast.    . Cholecalciferol (VITAMIN D) 2000 UNITS CAPS Take 2,000 Units by mouth daily.     . diphenhydrAMINE (BENADRYL) 25 MG tablet Take 12.5 mg by mouth daily.     . fexofenadine (ALLEGRA) 180 MG tablet Take 180 mg by mouth daily.    . hydrochlorothiazide (HYDRODIURIL) 25 MG tablet Take 1 tablet (25 mg total) by mouth daily. 30 tablet 1  . levothyroxine (SYNTHROID, LEVOTHROID) 50 MCG tablet Take 2 tablets (100 mcg total) by mouth daily. 180 tablet 3  . lisinopril (PRINIVIL,ZESTRIL) 40 MG tablet Take 1 tablet (40 mg total) by mouth daily. 30 tablet 1  . LORazepam (ATIVAN) 0.5 MG  tablet TAKE 1 BY MOUTH EVERY NIGHT FOR SLEEP 180 tablet 0  . metoprolol (LOPRESSOR) 50 MG tablet Take 1 tablet (50 mg total) by mouth 3 (three) times daily. 90 tablet 1  . Multiple Vitamins-Minerals (PRESERVISION AREDS PO) Take 1 tablet by mouth daily.    Marland Kitchen warfarin (COUMADIN) 5 MG tablet Take 5 mg po tonight, then 2.5 mg on Tuesday. Continue to alternate doses unless otherwise instructed by the [facility] 60 tablet 1   No current facility-administered medications for this visit.    Allergies:   Nitrofurantoin; Nsaids; Other; Flonase; Prevnar 13; Alendronate sodium; Ciprofloxacin; Codeine; Crestor; Darvon; Lipitor; Pravachol; Propranolol hcl; Sulfa antibiotics; Sulfamethoxazole; Ultram; Welchol; Zetia; and Zocor    Social History:  The patient  reports that she has never smoked. She does not have any smokeless tobacco history on file. She reports that she does not drink alcohol or use illicit drugs.   Family History:  The patient's family history includes CAD (age of onset: 35) in her father; CAD (age of onset: 50) in her daughter; CAD (age of onset: 33) in her mother; Diabetes type I in her daughter.    ROS:  Please see the history of present illness. All other systems are reviewed and negative.    PHYSICAL EXAM: VS:  BP 106/64 mmHg  Pulse 58  Ht 5\' 4"  (1.626 m)  Wt 156 lb 6.4 oz (70.943 kg)  BMI 26.83 kg/m2  LMP 03/21/1971 , BMI Body mass index is 26.83 kg/(m^2). GEN: Well nourished, well developed, female in no acute distress HEENT: normal for age  Neck: no JVD, no carotid bruit, no masses Cardiac: RRR; no murmur, no rubs, or gallops Respiratory:  Decreased breath sounds bases bilaterally, normal work of breathing GI: soft, nontender, nondistended, + BS MS: no deformity or atrophy; trace edema; distal pulses are 2+ in all 4 extremities  Skin: warm and dry, no rash; her incision and other surgical wounds are healing well. Neuro:  Strength and sensation are intact Psych:  euthymic mood, full affect   EKG:  EKG is not ordered today.   Recent Labs: 11/06/2015: Magnesium 1.8 11/08/2015: Hemoglobin 13.0; Platelets 251; TSH 2.845 11/16/2015: ALT 8; BUN 27; Creatinine, Ser 1.19*; Potassium 4.0; Sodium 144    Lipid Panel    Component Value Date/Time   CHOL 212* 10/31/2015 0215   CHOL 260* 09/23/2014 1028   TRIG 99 10/31/2015 0215   TRIG 162* 06/24/2015 0918   HDL 39* 10/31/2015 0215   HDL 44 06/24/2015 0918   HDL 47 09/23/2014 1028   CHOLHDL 5.4 10/31/2015 0215   CHOLHDL 5.5* 09/23/2014 1028   VLDL 20 10/31/2015 0215   LDLCALC 153* 10/31/2015 0215   LDLCALC 182*  09/23/2014 1028   LDLCALC 163* 05/14/2014 1038     Wt Readings from Last 3 Encounters:  11/18/15 156 lb 6.4 oz (70.943 kg)  11/15/15 153 lb (69.4 kg)  11/08/15 149 lb (67.586 kg)     Other studies Reviewed: Additional studies/ records that were reviewed today include: Office notes and hospital records.  ASSESSMENT AND PLAN:  1.  CABG with LIMA-LAD: She is doing well and recovering from the surgery. She is encouraged to continue to push herself and get her strength back. We will check a lipid profile, liver function tests and an echocardiogram in 3 months  2. Fatigue and dyspnea on exertion: She is on HCTZ 25 mg daily. She seems to have mostly daytime edema and not really have much in the way of heart failure symptoms. She has minimal volume overload by exam. She is to continue the HCTZ. She is encouraged to be on a low sodium diet and track her daily weights.  She was ambulated in the office and became quite short of breath with 2 laps around. Her oxygen saturations never dropped below 93%. However, her heart rate never got over 65, even when she was very short of breath. She appears to be overly beta blockaded. We will cut back the beta blocker and allow her heart rate to be a little bit higher when she exerts herself. She is to continue to track her heart rate and blood pressure.  3.  Chronic anticoagulation:CHADS2VASC=5 Cyril Mourning came to see Ms. Miggins and is transitioning her from warfarin to Eliquis.   Current medicines are reviewed at length with the patient today.  The patient has concerns regarding medicines. CONCERNS were addressed  The following changes have been made:  DC Coumadin, start Eliquis, change metoprolol from 25 mg 3 times a day to 25 mg twice a day  Labs/ tests ordered today include:   Orders Placed This Encounter  Procedures  . Lipid panel  . Hepatic function panel  . EKG 12-Lead  . ECHOCARDIOGRAM COMPLETE     Disposition:   FU with Dr. Percival Spanish  Signed, Lenoard Aden  11/18/2015 2:58 PM    [address], [place], [place]  [address] Phone: [phone]; Fax: [phone]

## 2015-11-18 NOTE — Patient Instructions (Signed)
Medication Instructions:  DECREASE Metoprolol Tartrate to 1 tablet (25 mg total) by mouth TWICE daily  >>A new prescription has been sent to the pharmacy electronically.  Labwork: Your physician recommends that you return for lab work in 3 months - FASTING.  Testing/Procedures: 1. 2D Echocardiogram - Your physician has requested that you have an echocardiogram in 3 months. Echocardiography is a painless test that uses sound waves to create images of your heart. It provides your doctor with information about the size and shape of your heart and how well your heart's chambers and valves are working. This procedure takes approximately one hour. There are no restrictions for this procedure.  Follow-Up: Rosaria Ferries, PA-C, recommends that you schedule a follow-up appointment in 3 months with Dr Percival Spanish. You will receive a reminder letter in the mail two months in advance. If you don't receive a letter, please call our office to schedule the follow-up appointment.  If you need a refill on your cardiac medications before your next appointment, please call your pharmacy.  **Please continue to record your daily weights and blood pressures **Please follow a low sodium diet (see information provided)

## 2015-11-19 ENCOUNTER — Telehealth: Payer: Self-pay | Admitting: Cardiology

## 2015-11-19 MED ORDER — APIXABAN 5 MG PO TABS: 5.0000 mg | ORAL_TABLET | Freq: Two times a day (BID) | ORAL | Status: DC

## 2015-11-19 NOTE — Telephone Encounter (Signed)
Pt needs a written prescription to go along with her discount card for Eliquis. Please mail it to her.

## 2015-11-26 ENCOUNTER — Telehealth: Payer: Self-pay | Admitting: Cardiology

## 2015-11-26 NOTE — Telephone Encounter (Signed)
Probably the amiodarone, but PA or MD will have to make the decision to stop or switch that.

## 2015-11-26 NOTE — Telephone Encounter (Signed)
SPOKE TO PATIENT  SHE STATES SHE WILL STILL UNABLE TO TOLERATE FOOD  SHE BEOCMES NAUSEA NO APPETITE SHE STATES IT IS ALL DAY TAKES ABOUT 5 TABLETS IN THE MORNING  4 AT NIGHT AN 2 IN THE EVENING  INSTRUCTED A PATIENT SMALL MEALS, SUPPLEMENT WITH EITHER BOOST OR ENSURE WILL  SEND TO PHARMACIST AND EXTENDER  IF ANY MEDICATIONS MAY CAUSE

## 2015-11-26 NOTE — Telephone Encounter (Signed)
Pt had heart surgery on 11-02-15.One of her medicine is making her feel sick and no appetite.Please call to advise.

## 2015-11-30 NOTE — Telephone Encounter (Signed)
Tell her to hold the amiodarone for now and I will discuss Dr Percival Spanish. If her symptoms do not improve in a couple of weeks, see [name] or primary MD.

## 2015-11-30 NOTE — Telephone Encounter (Signed)
Tell her to stop the amiodarone and see if this helps.

## 2015-12-01 NOTE — Telephone Encounter (Signed)
Pt aware to hold amiodarone to see if there is any improvement in her s/s.  If not she will call back or call her PCP.

## 2015-12-02 ENCOUNTER — Telehealth: Payer: Self-pay | Admitting: *Deleted

## 2015-12-02 ENCOUNTER — Telehealth: Payer: Self-pay | Admitting: Cardiology

## 2015-12-02 DIAGNOSIS — Z48812 Encounter for surgical aftercare following surgery on the circulatory system: Secondary | ICD-10-CM | POA: Diagnosis not present

## 2015-12-02 DIAGNOSIS — I4891 Unspecified atrial fibrillation: Secondary | ICD-10-CM | POA: Diagnosis not present

## 2015-12-02 DIAGNOSIS — M810 Age-related osteoporosis without current pathological fracture: Secondary | ICD-10-CM | POA: Diagnosis not present

## 2015-12-02 DIAGNOSIS — K5790 Diverticulosis of intestine, part unspecified, without perforation or abscess without bleeding: Secondary | ICD-10-CM | POA: Diagnosis not present

## 2015-12-02 DIAGNOSIS — N390 Urinary tract infection, site not specified: Secondary | ICD-10-CM | POA: Diagnosis not present

## 2015-12-02 DIAGNOSIS — N3010 Interstitial cystitis (chronic) without hematuria: Secondary | ICD-10-CM | POA: Diagnosis not present

## 2015-12-02 DIAGNOSIS — I25119 Atherosclerotic heart disease of native coronary artery with unspecified angina pectoris: Secondary | ICD-10-CM | POA: Diagnosis not present

## 2015-12-02 DIAGNOSIS — E785 Hyperlipidemia, unspecified: Secondary | ICD-10-CM | POA: Diagnosis not present

## 2015-12-02 DIAGNOSIS — Z951 Presence of aortocoronary bypass graft: Secondary | ICD-10-CM | POA: Diagnosis not present

## 2015-12-02 DIAGNOSIS — Z7901 Long term (current) use of anticoagulants: Secondary | ICD-10-CM | POA: Diagnosis not present

## 2015-12-02 DIAGNOSIS — I10 Essential (primary) hypertension: Secondary | ICD-10-CM | POA: Diagnosis not present

## 2015-12-02 DIAGNOSIS — Z5181 Encounter for therapeutic drug level monitoring: Secondary | ICD-10-CM | POA: Diagnosis not present

## 2015-12-02 NOTE — Telephone Encounter (Signed)
Malachy Mood, home health aide called from patient home to report that the patient had a BP of 110/70 today, w/ HR of 52. No c/o problems r/t dizziness/lightheadedness today. She does report that the patient felt woozy yesterday & had been instructed on d/c'ing amiodarone. I confirmed these instructions were communicated correctly. Pt last took amiodarone yesterday and did not take today. Advised that since the amiodarone has long half-life, it may take several days to clear the system, but that the patient may expect improvement of symptoms after a few days. Instructed to call if further/new concerns or problems.

## 2015-12-02 NOTE — Telephone Encounter (Signed)
What would you recommend?  Melatonin?

## 2015-12-02 NOTE — Telephone Encounter (Signed)
Try melatonin 3 mg 1 at bedtime patient is highly sensitive to many medications and I would prefer this particular medicine.

## 2015-12-02 NOTE — Telephone Encounter (Signed)
[facility] aware

## 2015-12-02 NOTE — Telephone Encounter (Signed)
Pt has not been able to sleep since surgery. Is there anything we can prescribe to help her sleep. She says she called last week and never got an answer, but doesn't know who she spoke to. She takes ativan at night but it has not been working. She is weak and tired after surgery and needs some sleep. Call cheryl with [facility].

## 2015-12-08 ENCOUNTER — Other Ambulatory Visit: Payer: Self-pay | Admitting: Pharmacist Clinician (PhC)/ Clinical Pharmacy Specialist

## 2015-12-08 NOTE — Telephone Encounter (Signed)
Rx request sent to pharmacy.  

## 2015-12-09 DIAGNOSIS — Z7901 Long term (current) use of anticoagulants: Secondary | ICD-10-CM | POA: Diagnosis not present

## 2015-12-09 DIAGNOSIS — I4891 Unspecified atrial fibrillation: Secondary | ICD-10-CM | POA: Diagnosis not present

## 2015-12-09 DIAGNOSIS — N3010 Interstitial cystitis (chronic) without hematuria: Secondary | ICD-10-CM | POA: Diagnosis not present

## 2015-12-09 DIAGNOSIS — Z48812 Encounter for surgical aftercare following surgery on the circulatory system: Secondary | ICD-10-CM | POA: Diagnosis not present

## 2015-12-09 DIAGNOSIS — M810 Age-related osteoporosis without current pathological fracture: Secondary | ICD-10-CM | POA: Diagnosis not present

## 2015-12-09 DIAGNOSIS — N390 Urinary tract infection, site not specified: Secondary | ICD-10-CM | POA: Diagnosis not present

## 2015-12-09 DIAGNOSIS — E785 Hyperlipidemia, unspecified: Secondary | ICD-10-CM | POA: Diagnosis not present

## 2015-12-09 DIAGNOSIS — Z951 Presence of aortocoronary bypass graft: Secondary | ICD-10-CM | POA: Diagnosis not present

## 2015-12-09 DIAGNOSIS — I10 Essential (primary) hypertension: Secondary | ICD-10-CM | POA: Diagnosis not present

## 2015-12-09 DIAGNOSIS — K5790 Diverticulosis of intestine, part unspecified, without perforation or abscess without bleeding: Secondary | ICD-10-CM | POA: Diagnosis not present

## 2015-12-09 DIAGNOSIS — Z5181 Encounter for therapeutic drug level monitoring: Secondary | ICD-10-CM | POA: Diagnosis not present

## 2015-12-09 DIAGNOSIS — I25119 Atherosclerotic heart disease of native coronary artery with unspecified angina pectoris: Secondary | ICD-10-CM | POA: Diagnosis not present

## 2015-12-10 ENCOUNTER — Other Ambulatory Visit: Payer: Self-pay | Admitting: Thoracic Surgery (Cardiothoracic Vascular Surgery)

## 2015-12-10 DIAGNOSIS — Z951 Presence of aortocoronary bypass graft: Secondary | ICD-10-CM

## 2015-12-13 ENCOUNTER — Encounter: Payer: Self-pay | Admitting: Thoracic Surgery (Cardiothoracic Vascular Surgery)

## 2015-12-13 ENCOUNTER — Ambulatory Visit (INDEPENDENT_AMBULATORY_CARE_PROVIDER_SITE_OTHER): Payer: Self-pay | Admitting: Thoracic Surgery (Cardiothoracic Vascular Surgery)

## 2015-12-13 ENCOUNTER — Ambulatory Visit
Admission: RE | Admit: 2015-12-13 | Discharge: 2015-12-13 | Disposition: A | Discharge status: Home / Self Care | Payer: Medicare Other | Source: Ambulatory Visit | Attending: Thoracic Surgery (Cardiothoracic Vascular Surgery) | Admitting: Thoracic Surgery (Cardiothoracic Vascular Surgery)

## 2015-12-13 VITALS — BP 129/74 | HR 61 | Resp 16 | Ht 64.0 in | Wt 153.0 lb

## 2015-12-13 DIAGNOSIS — I2510 Atherosclerotic heart disease of native coronary artery without angina pectoris: Secondary | ICD-10-CM

## 2015-12-13 DIAGNOSIS — Z951 Presence of aortocoronary bypass graft: Secondary | ICD-10-CM

## 2015-12-13 DIAGNOSIS — R918 Other nonspecific abnormal finding of lung field: Secondary | ICD-10-CM | POA: Diagnosis not present

## 2015-12-13 NOTE — Progress Notes (Signed)
[address]       [place] [address]             [phone]     CARDIOTHORACIC SURGERY OFFICE NOTE  Referring Provider is Minus Breeding, MD PCP is Redge Gainer, MD   HPI: Emily Wagner is an 82 yo female approximately 7 weeks s/p off-pump CABG x1 performed on 11/02/15 for severe multi-vessel coronary artery disease presenting for routine follow-up. She was discharged on post op day 5 . During her post operative period she developed atrial fibrillation and was treated with Amiodarone with successful conversion to normal sinus rhythm. She also had some postop hypertension that was treated with medication and stabilized over her hospital course. On discharge she was ambulating without assistance on warfarin anticoagulation.  Since hospital discharge she has been seen in follow-up by her primary care physician and by Rosaria Ferries at [facility].  At the patient's request she was changed from warfarin to Eliquis for anticoagulation.  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 She says that she walks regularly as the weather allows. When she is unable to walk due to weather she rides a stationary bike. She denies chest pain, shortness of breath, dyspnea on exertion, orthopnea, PND, dizziness, vision changes, and edema. Her home records of weights and blood pressure show that both are stable and within normal limits.    Current Outpatient Prescriptions  Medication Sig Dispense Refill  . amLODipine (NORVASC) 10 MG tablet TAKE 1 TABLET EVERY DAY 30 tablet 1  . apixaban (ELIQUIS) 5 MG TABS tablet Take 1 tablet (5 mg total) by mouth 2 (two) times daily. 60 tablet 03  . aspirin EC 81 MG tablet Take 81 mg by mouth daily.    . calcium carbonate (OS-CAL) 600 MG TABS tablet Take 600 mg by mouth daily with breakfast.    .  diphenhydrAMINE (BENADRYL) 25 MG tablet Take 12.5 mg by mouth daily.     . fexofenadine (ALLEGRA) 180 MG tablet Take 180 mg by mouth daily.    . hydrochlorothiazide (HYDRODIURIL) 25 MG tablet TAKE 1 TABLET EVERY DAY 30 tablet 1  . levothyroxine (SYNTHROID, LEVOTHROID) 50 MCG tablet Take 2 tablets (100 mcg total) by mouth daily. 180 tablet 3  . lisinopril (PRINIVIL,ZESTRIL) 40 MG tablet TAKE 1 TABLET EVERY DAY 30 tablet 1  . LORazepam (ATIVAN) 0.5 MG tablet TAKE 1 BY MOUTH EVERY NIGHT FOR SLEEP 180 tablet 0  . metoprolol (LOPRESSOR) 50 MG tablet TAKE 1 TABLET 3 TIMES A DAY 90 tablet 0  . Multiple Vitamins-Minerals (PRESERVISION AREDS PO) Take 1 tablet by mouth daily.    . Cholecalciferol (VITAMIN D) 2000 UNITS CAPS Take 2,000 Units by mouth daily.      No current facility-administered medications for this visit.      Physical Exam:   BP 129/74 mmHg  Pulse 61  Resp 16  Ht 5\' 4"  (1.626 m)  Wt 153 lb (69.4 kg)  BMI 26.25 kg/m2  SpO2 98%  LMP 03/21/1971  General:  Well appearing  Chest:   Regular rate and rhythm  CV:   Clear to posterior ascultation, bilaterally  Incisions:  Dry, intact  Abdomen:  Soft, non-tender, non-distended, normoactive bowel sounds  Extremities:  Warm, mild 1+ edema lower extremities   Diagnostic Tests:  None  Impression:  Patient is doing well 7 weeks status post off-pump CABGx1 with good progress in exercise tolerance.  Plan:  No changes to medications at this time. Patient encourage to continue regular exercise as tolerated as she says that she is unable to attend cardiac rehabilitation. She is encouraged to use her cane when ambulating and continue to monitor her weights and blood pressure. The patient may gradually return to driving.The patient will be seen in the office for regular follow up in two months. She will call or return to the office with any immediate complaints or specific problems of questions.  Lawernce Keas PA-S    I have seen  and examined the patient and agree with the assessment and plan as outlined.  Will defer decisions regarding the duration of anticoagulation therapy for postoperative atrial fibrillation to Dr. Percival Spanish and colleagues. I've encouraged patient to increase her physical activity as tolerated. She does not plan to enroll and participate in the outpatient cardiac rehabilitation program because of the long distance to the closest rehabilitation facility. I suggested that she will need to act as her own coach and make a specific effort to gradually increase her activity. We discussed walking on a daily basis, perhaps at her local church or a store if the weather is to call to walk outdoors. I think she may resume driving an automobile. All of her questions have been addressed.  We will have her return in 2 months for routine follow-up.  Rexene Alberts, MD 12/13/2015 5:10 PM

## 2015-12-13 NOTE — Patient Instructions (Addendum)
Continue to avoid any heavy lifting or strenuous use of your arms or shoulders for at least a total of three months from the time of surgery.  After three months you may gradually increase how much you lift or otherwise use your arms or chest as tolerated, with limits based upon whether or not activities lead to the return of significant discomfort.  Enroll and participate in the outpatient cardiac rehab program beginning as soon as practical.  You may return to driving an automobile as long as you are no longer requiring oral narcotic pain relievers during the daytime.  It would be wise to start driving only short distances during the daylight and gradually increase from there as you feel comfortable.  Continue all previous medications without any changes at this time

## 2015-12-16 DIAGNOSIS — Z7901 Long term (current) use of anticoagulants: Secondary | ICD-10-CM | POA: Diagnosis not present

## 2015-12-16 DIAGNOSIS — I4891 Unspecified atrial fibrillation: Secondary | ICD-10-CM | POA: Diagnosis not present

## 2015-12-16 DIAGNOSIS — I25119 Atherosclerotic heart disease of native coronary artery with unspecified angina pectoris: Secondary | ICD-10-CM | POA: Diagnosis not present

## 2015-12-16 DIAGNOSIS — M9903 Segmental and somatic dysfunction of lumbar region: Secondary | ICD-10-CM | POA: Diagnosis not present

## 2015-12-16 DIAGNOSIS — I10 Essential (primary) hypertension: Secondary | ICD-10-CM | POA: Diagnosis not present

## 2015-12-16 DIAGNOSIS — Z951 Presence of aortocoronary bypass graft: Secondary | ICD-10-CM | POA: Diagnosis not present

## 2015-12-16 DIAGNOSIS — M9901 Segmental and somatic dysfunction of cervical region: Secondary | ICD-10-CM | POA: Diagnosis not present

## 2015-12-16 DIAGNOSIS — E785 Hyperlipidemia, unspecified: Secondary | ICD-10-CM | POA: Diagnosis not present

## 2015-12-16 DIAGNOSIS — Z5181 Encounter for therapeutic drug level monitoring: Secondary | ICD-10-CM | POA: Diagnosis not present

## 2015-12-16 DIAGNOSIS — N390 Urinary tract infection, site not specified: Secondary | ICD-10-CM | POA: Diagnosis not present

## 2015-12-16 DIAGNOSIS — K5790 Diverticulosis of intestine, part unspecified, without perforation or abscess without bleeding: Secondary | ICD-10-CM | POA: Diagnosis not present

## 2015-12-16 DIAGNOSIS — I973 Postprocedural hypertension: Secondary | ICD-10-CM | POA: Diagnosis not present

## 2015-12-16 DIAGNOSIS — N3010 Interstitial cystitis (chronic) without hematuria: Secondary | ICD-10-CM | POA: Diagnosis not present

## 2015-12-16 DIAGNOSIS — M810 Age-related osteoporosis without current pathological fracture: Secondary | ICD-10-CM | POA: Diagnosis not present

## 2015-12-16 DIAGNOSIS — M9902 Segmental and somatic dysfunction of thoracic region: Secondary | ICD-10-CM | POA: Diagnosis not present

## 2015-12-16 DIAGNOSIS — M5137 Other intervertebral disc degeneration, lumbosacral region: Secondary | ICD-10-CM | POA: Diagnosis not present

## 2015-12-16 DIAGNOSIS — Z48812 Encounter for surgical aftercare following surgery on the circulatory system: Secondary | ICD-10-CM | POA: Diagnosis not present

## 2015-12-17 ENCOUNTER — Telehealth: Payer: Self-pay | Admitting: Cardiology

## 2015-12-17 NOTE — Telephone Encounter (Signed)
Pt recommended to cut lisinopril dose in 1/2. Pt encouraged to continue drinking adequate fluids (she did endorse this at initial call, I forgot to mention). Recommended to take time when changing positions. Should expect improvement after a few days.  Pt voiced understanding of instructions.

## 2015-12-17 NOTE — Telephone Encounter (Signed)
Have her cut the lisinopril to 20 mg daily and see if that improves things

## 2015-12-17 NOTE — Telephone Encounter (Signed)
Returned call to patient.  She states "a little lightheaded", she denies acute symptoms, i.e. URI, cough or congestion. No recent illness. No new medications.  States BP has been running low. Yesterday 82/53 when checked by nurse at home.  Today 114/60 first thing AM, she rechecked and was 89/61 w HR of 56 just before she called. Pt gives other comparable readings going back a few days.  She is requesting advice.  Did verify meds w/ patient. She is taking metoprolol differently than reported: 50mg  BID (not TID). She is taking lisinopril 40mg .  Will route for advice on dose adjustments.

## 2015-12-17 NOTE — Telephone Encounter (Signed)
Mrs. Chami is calling because her bp  is 89/60 and heart rate is 56 and is wanting to know if she is to do something different . Please call   Thanks

## 2015-12-21 DIAGNOSIS — M9902 Segmental and somatic dysfunction of thoracic region: Secondary | ICD-10-CM | POA: Diagnosis not present

## 2015-12-21 DIAGNOSIS — I973 Postprocedural hypertension: Secondary | ICD-10-CM | POA: Diagnosis not present

## 2015-12-21 DIAGNOSIS — M9901 Segmental and somatic dysfunction of cervical region: Secondary | ICD-10-CM | POA: Diagnosis not present

## 2015-12-21 DIAGNOSIS — M9903 Segmental and somatic dysfunction of lumbar region: Secondary | ICD-10-CM | POA: Diagnosis not present

## 2015-12-21 DIAGNOSIS — M5137 Other intervertebral disc degeneration, lumbosacral region: Secondary | ICD-10-CM | POA: Diagnosis not present

## 2015-12-22 ENCOUNTER — Telehealth: Payer: Self-pay | Admitting: Cardiology

## 2015-12-22 DIAGNOSIS — I4891 Unspecified atrial fibrillation: Secondary | ICD-10-CM | POA: Diagnosis not present

## 2015-12-22 DIAGNOSIS — Z951 Presence of aortocoronary bypass graft: Secondary | ICD-10-CM | POA: Diagnosis not present

## 2015-12-22 DIAGNOSIS — I10 Essential (primary) hypertension: Secondary | ICD-10-CM | POA: Diagnosis not present

## 2015-12-22 DIAGNOSIS — M810 Age-related osteoporosis without current pathological fracture: Secondary | ICD-10-CM | POA: Diagnosis not present

## 2015-12-22 DIAGNOSIS — K5790 Diverticulosis of intestine, part unspecified, without perforation or abscess without bleeding: Secondary | ICD-10-CM | POA: Diagnosis not present

## 2015-12-22 DIAGNOSIS — I25119 Atherosclerotic heart disease of native coronary artery with unspecified angina pectoris: Secondary | ICD-10-CM | POA: Diagnosis not present

## 2015-12-22 DIAGNOSIS — E785 Hyperlipidemia, unspecified: Secondary | ICD-10-CM | POA: Diagnosis not present

## 2015-12-22 DIAGNOSIS — N390 Urinary tract infection, site not specified: Secondary | ICD-10-CM | POA: Diagnosis not present

## 2015-12-22 DIAGNOSIS — Z48812 Encounter for surgical aftercare following surgery on the circulatory system: Secondary | ICD-10-CM | POA: Diagnosis not present

## 2015-12-22 DIAGNOSIS — Z7901 Long term (current) use of anticoagulants: Secondary | ICD-10-CM | POA: Diagnosis not present

## 2015-12-22 DIAGNOSIS — Z5181 Encounter for therapeutic drug level monitoring: Secondary | ICD-10-CM | POA: Diagnosis not present

## 2015-12-22 DIAGNOSIS — N3010 Interstitial cystitis (chronic) without hematuria: Secondary | ICD-10-CM | POA: Diagnosis not present

## 2015-12-22 NOTE — Telephone Encounter (Signed)
Spoke with Emily Wagner, HHN, pts lisinopril was decreased to 20 mg once daily on 12-17-15. Today after walking her bp was 94/56 with pulse 56. Recheck by the [facility] was 100/78 with pulse 54. Pt is c/o being tired and weak Will forward for dr hochrein's review

## 2015-12-23 NOTE — Telephone Encounter (Signed)
Reduce lisinopril to 10 mg po daily.

## 2015-12-23 NOTE — Telephone Encounter (Signed)
Returned call. Advice communicated to [facility] who voiced acknowledgement of instruction.

## 2015-12-24 ENCOUNTER — Telehealth: Payer: Self-pay | Admitting: Cardiology

## 2015-12-24 MED ORDER — LISINOPRIL 10 MG PO TABS: 10.0000 mg | ORAL_TABLET | Freq: Every day | ORAL | Status: DC

## 2015-12-24 NOTE — Telephone Encounter (Signed)
New message      Need new presc for lisinopril 10mg  called in to [facility] at [facility].  Nurse said that she was taking 40mg  and now she is to take 10mg .  If any problems, please call the nurse.

## 2015-12-24 NOTE — Telephone Encounter (Signed)
Refill sent for new dosing OK'd by Dr. Percival Spanish (see prev encounter)

## 2015-12-28 ENCOUNTER — Encounter: Payer: Self-pay | Admitting: Family Medicine

## 2015-12-28 ENCOUNTER — Ambulatory Visit (INDEPENDENT_AMBULATORY_CARE_PROVIDER_SITE_OTHER): Payer: Medicare Other | Admitting: Family Medicine

## 2015-12-28 VITALS — BP 111/66 | HR 55 | Temp 97.0°F | Ht 64.0 in | Wt 157.0 lb

## 2015-12-28 DIAGNOSIS — I480 Paroxysmal atrial fibrillation: Secondary | ICD-10-CM

## 2015-12-28 DIAGNOSIS — N3010 Interstitial cystitis (chronic) without hematuria: Secondary | ICD-10-CM | POA: Diagnosis not present

## 2015-12-28 DIAGNOSIS — Z951 Presence of aortocoronary bypass graft: Secondary | ICD-10-CM

## 2015-12-28 DIAGNOSIS — I10 Essential (primary) hypertension: Secondary | ICD-10-CM

## 2015-12-28 MED ORDER — CIPROFLOXACIN HCL 500 MG PO TABS: 500.0000 mg | ORAL_TABLET | Freq: Every day | ORAL | Status: DC

## 2015-12-28 MED ORDER — LEVOTHYROXINE SODIUM 50 MCG PO TABS: 100.0000 ug | ORAL_TABLET | Freq: Every day | ORAL | Status: DC

## 2015-12-28 MED ORDER — LORAZEPAM 0.5 MG PO TABS: ORAL_TABLET | ORAL | Status: DC

## 2015-12-28 NOTE — Progress Notes (Signed)
Subjective:    Patient ID: Emily Wagner, female    DOB: [birth year], 82 y.o.   MRN: [id number]  HPI  Patient here today for 6 week follow up from recent hospital stay. The patient has seen the surgeon and plans to see him again in March. She also will be seen the cardiologist in March. She is doing well and is concerned about her daughter has had another stroke and in the hospital. She denies chest pain shortness of breath GI symptoms or trouble passing her water. She brings in blood pressures for review and I will be scanned into the record.    Patient Active Problem List   Diagnosis Date Noted  . Long term (current) use of anticoagulants 11/11/2015  . Atrial fibrillation ([place]) 11/04/2015  . 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 Infection of urinary tract 10/31/2015  . Unstable angina ([place]) 10/29/2015  . CAD (coronary artery disease) 10/29/2015  . Benign hypertensive heart disease without heart failure   . Interstitial cystitis   . Osteopenia   . Pelvic fracture ([place]) 05/30/2015  . Allergy to multiple medications 07/15/2013  . Renal insufficiency   . Adjustment disorder with mixed anxiety and depressed mood   . Osteoporosis   . ASCVD (arteriosclerotic cardiovascular disease)   . 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  . HYPERCHOLESTEROLEMIA 05/02/2008  . Essential hypertension 05/02/2008  . INTERSTITIAL CYSTITIS 05/02/2008  . DIVERTICULOSIS OF COLON 08/27/2003   Outpatient Encounter Prescriptions as of 12/28/2015  Medication Sig  . amLODipine (NORVASC) 10 MG tablet TAKE 1 TABLET EVERY DAY  . apixaban (ELIQUIS) 5 MG TABS tablet Take 1 tablet (5 mg total) by mouth 2 (two) times daily.  Marland Kitchen aspirin EC 81 MG tablet Take 81 mg by mouth daily.  . calcium carbonate (OS-CAL) 600 MG TABS tablet Take 600 mg by mouth daily with breakfast.  . Cholecalciferol (VITAMIN D) 2000 UNITS CAPS Take 2,000 Units by mouth daily.   . diphenhydrAMINE (BENADRYL) 25 MG tablet Take 12.5 mg by mouth daily.   .  fexofenadine (ALLEGRA) 180 MG tablet Take 180 mg by mouth daily.  . hydrochlorothiazide (HYDRODIURIL) 25 MG tablet TAKE 1 TABLET EVERY DAY  . levothyroxine (SYNTHROID, LEVOTHROID) 50 MCG tablet Take 2 tablets (100 mcg total) by mouth daily.  Marland Kitchen lisinopril (PRINIVIL,ZESTRIL) 10 MG tablet Take 1 tablet (10 mg total) by mouth daily.  Marland Kitchen LORazepam (ATIVAN) 0.5 MG tablet TAKE 1 BY MOUTH EVERY NIGHT FOR SLEEP  . metoprolol (LOPRESSOR) 50 MG tablet TAKE 1 TABLET 3 TIMES A DAY (Patient taking differently: TAKE 1 TABLET 2 TIMES A DAY)  . Multiple Vitamins-Minerals (PRESERVISION AREDS PO) Take 1 tablet by mouth daily.   No facility-administered encounter medications on file as of 12/28/2015.      Review of Systems  Constitutional: Positive for fatigue.  HENT: Negative.   Eyes: Negative.   Respiratory: Negative.   Cardiovascular: Negative.   Gastrointestinal: Negative.   Endocrine: Negative.   Genitourinary: Negative.   Musculoskeletal: Negative.   Skin: Negative.   Allergic/Immunologic: Negative.   Neurological: Negative.   Hematological: Negative.   Psychiatric/Behavioral: Negative.        Objective:   Physical Exam  Constitutional: She is oriented to person, place, and time. She appears well-developed and well-nourished. No distress.  The patient is alert and looks much more like herself at this visit. She is somewhat stressed because of her daughter having had another stroke and being in the hospital.  HENT:  Head: Normocephalic.  Right Ear: External ear normal.  Left Ear: External ear normal.  Nose: Nose normal.  Mouth/Throat: Oropharynx is clear and moist.  Eyes: Conjunctivae and EOM are normal. Pupils are equal, round, and reactive to light. Right eye exhibits no discharge. Left eye exhibits no discharge. No scleral icterus.  Neck: Normal range of motion. Neck supple. No thyromegaly present.  Cardiovascular: Normal rate, regular rhythm and normal heart sounds.   No murmur  heard. The heart is regular today at 60/m  Pulmonary/Chest: Effort normal and breath sounds normal. No respiratory distress. She has no wheezes. She has no rales. She exhibits no tenderness.  Clear anteriorly and posteriorly  Abdominal: Soft. Bowel sounds are normal. She exhibits no mass. There is no tenderness. There is no rebound and no guarding.  Musculoskeletal: Normal range of motion. She exhibits no edema.  Lymphadenopathy:    She has no cervical adenopathy.  Neurological: She is alert and oriented to person, place, and time.  Skin: Skin is warm and dry.  Psychiatric: She has a normal mood and affect. Her behavior is normal. Judgment and thought content normal.  Nursing note and vitals reviewed.  BP 111/66 mmHg  Pulse 55  Temp(Src) 97 F (36.1 C) (Oral)  Ht 5\' 4"  (1.626 m)  Wt 157 lb (71.215 kg)  BMI 26.94 kg/m2  LMP 03/21/1971        Assessment & Plan:  1. Paroxysmal atrial fibrillation (HCC) -The heart was regular today at 60/m. She will continue to follow-up with cardiology.  2. Interstitial cystitis -No complaints with this today.  3. Essential hypertension -She brings in blood pressures for review and they were all good and the patient will continue with current treatment  4. Hx of CABG -Follow up with cardiovascular surgeon as planned  Meds ordered this encounter  Medications  . ciprofloxacin (CIPRO) 500 MG tablet    Sig: Take 1 tablet (500 mg total) by mouth daily with breakfast.    Dispense:  90 tablet    Refill:  0  . levothyroxine (SYNTHROID, LEVOTHROID) 50 MCG tablet    Sig: Take 2 tablets (100 mcg total) by mouth daily.    Dispense:  180 tablet    Refill:  3  . LORazepam (ATIVAN) 0.5 MG tablet    Sig: TAKE 1 BY MOUTH EVERY NIGHT FOR SLEEP    Dispense:  180 tablet    Refill:  0   Patient Instructions  Follow-up with surgeon and cardiology and continue current treatment Continue to walk and strength in your body being careful not to fall For  the remainder of the winter drink plenty of fluids and stay well hydrated   Arrie Senate MD

## 2015-12-28 NOTE — Patient Instructions (Signed)
Follow-up with surgeon and cardiology and continue current treatment Continue to walk and strength in your body being careful not to fall For the remainder of the winter drink plenty of fluids and stay well hydrated

## 2015-12-31 DIAGNOSIS — Z48812 Encounter for surgical aftercare following surgery on the circulatory system: Secondary | ICD-10-CM | POA: Diagnosis not present

## 2015-12-31 DIAGNOSIS — Z951 Presence of aortocoronary bypass graft: Secondary | ICD-10-CM | POA: Diagnosis not present

## 2015-12-31 DIAGNOSIS — K5790 Diverticulosis of intestine, part unspecified, without perforation or abscess without bleeding: Secondary | ICD-10-CM | POA: Diagnosis not present

## 2015-12-31 DIAGNOSIS — I10 Essential (primary) hypertension: Secondary | ICD-10-CM | POA: Diagnosis not present

## 2015-12-31 DIAGNOSIS — N390 Urinary tract infection, site not specified: Secondary | ICD-10-CM | POA: Diagnosis not present

## 2015-12-31 DIAGNOSIS — I4891 Unspecified atrial fibrillation: Secondary | ICD-10-CM | POA: Diagnosis not present

## 2015-12-31 DIAGNOSIS — Z5181 Encounter for therapeutic drug level monitoring: Secondary | ICD-10-CM | POA: Diagnosis not present

## 2015-12-31 DIAGNOSIS — E785 Hyperlipidemia, unspecified: Secondary | ICD-10-CM | POA: Diagnosis not present

## 2015-12-31 DIAGNOSIS — M810 Age-related osteoporosis without current pathological fracture: Secondary | ICD-10-CM | POA: Diagnosis not present

## 2015-12-31 DIAGNOSIS — I25119 Atherosclerotic heart disease of native coronary artery with unspecified angina pectoris: Secondary | ICD-10-CM | POA: Diagnosis not present

## 2015-12-31 DIAGNOSIS — N3010 Interstitial cystitis (chronic) without hematuria: Secondary | ICD-10-CM | POA: Diagnosis not present

## 2015-12-31 DIAGNOSIS — Z7901 Long term (current) use of anticoagulants: Secondary | ICD-10-CM | POA: Diagnosis not present

## 2016-01-03 ENCOUNTER — Telehealth: Payer: Self-pay | Admitting: Cardiology

## 2016-01-03 NOTE — Telephone Encounter (Signed)
Spoke with pt, called [employer] for prior authorization. eliquis approved until 11-26-16. Pt made aware.

## 2016-01-03 NOTE — Telephone Encounter (Signed)
Follow up      Pt said disregard the previous message.  The letter from [facility] was sent in error

## 2016-01-03 NOTE — Telephone Encounter (Signed)
Pt doing well on Eliquis, but she got a letter today stating it's not on her formulary and will not cover w/o an request from our office that that's what she needs to be on -pls call pt with update when available [phone]

## 2016-01-03 NOTE — Telephone Encounter (Signed)
Follow up      Now pt calls and says that she needs prior authorization on her eliquis.  Ins will not pay until it is done.

## 2016-01-06 DIAGNOSIS — N3010 Interstitial cystitis (chronic) without hematuria: Secondary | ICD-10-CM | POA: Diagnosis not present

## 2016-01-06 DIAGNOSIS — Z48812 Encounter for surgical aftercare following surgery on the circulatory system: Secondary | ICD-10-CM | POA: Diagnosis not present

## 2016-01-06 DIAGNOSIS — I4891 Unspecified atrial fibrillation: Secondary | ICD-10-CM | POA: Diagnosis not present

## 2016-01-06 DIAGNOSIS — Z7901 Long term (current) use of anticoagulants: Secondary | ICD-10-CM | POA: Diagnosis not present

## 2016-01-06 DIAGNOSIS — Z951 Presence of aortocoronary bypass graft: Secondary | ICD-10-CM | POA: Diagnosis not present

## 2016-01-06 DIAGNOSIS — M810 Age-related osteoporosis without current pathological fracture: Secondary | ICD-10-CM | POA: Diagnosis not present

## 2016-01-06 DIAGNOSIS — Z5181 Encounter for therapeutic drug level monitoring: Secondary | ICD-10-CM | POA: Diagnosis not present

## 2016-01-06 DIAGNOSIS — K5790 Diverticulosis of intestine, part unspecified, without perforation or abscess without bleeding: Secondary | ICD-10-CM | POA: Diagnosis not present

## 2016-01-06 DIAGNOSIS — N390 Urinary tract infection, site not specified: Secondary | ICD-10-CM | POA: Diagnosis not present

## 2016-01-06 DIAGNOSIS — E785 Hyperlipidemia, unspecified: Secondary | ICD-10-CM | POA: Diagnosis not present

## 2016-01-06 DIAGNOSIS — I10 Essential (primary) hypertension: Secondary | ICD-10-CM | POA: Diagnosis not present

## 2016-01-06 DIAGNOSIS — I25119 Atherosclerotic heart disease of native coronary artery with unspecified angina pectoris: Secondary | ICD-10-CM | POA: Diagnosis not present

## 2016-01-20 ENCOUNTER — Telehealth: Payer: Self-pay | Admitting: Family Medicine

## 2016-01-21 ENCOUNTER — Other Ambulatory Visit: Payer: Medicare Other

## 2016-01-21 DIAGNOSIS — M7989 Other specified soft tissue disorders: Secondary | ICD-10-CM

## 2016-01-21 NOTE — Telephone Encounter (Signed)
Is this ok to order or does she need an office visit?

## 2016-01-21 NOTE — Telephone Encounter (Signed)
Please get BMP

## 2016-01-21 NOTE — Telephone Encounter (Signed)
Patient states weight yesterday she was 153 and today 154 and no increase in sodium or SOB. BMP ordered.

## 2016-01-21 NOTE — Telephone Encounter (Signed)
Please get BMP and weight and make sure that she is not having any increased shortness of breath. Also make sure that she is not eating any extra sodium. If she is having any symptoms regarding her breathing  she needs to be seen by someone

## 2016-01-22 LAB — BMP8+EGFR
BUN / CREAT RATIO: 20 (ref 11–26)
BUN: 25 mg/dL (ref 8–27)
CALCIUM: 9.3 mg/dL (ref 8.7–10.3)
CHLORIDE: 100 mmol/L (ref 96–106)
CO2: 23 mmol/L (ref 18–29)
Creatinine, Ser: 1.23 mg/dL — ABNORMAL HIGH (ref 0.57–1.00)
GFR calc Af Amer: 48 mL/min/{1.73_m2} — ABNORMAL LOW (ref >59)
GFR calc non Af Amer: 42 mL/min/{1.73_m2} — ABNORMAL LOW (ref >59)
Glucose: 103 mg/dL — ABNORMAL HIGH (ref 65–99)
POTASSIUM: 4 mmol/L (ref 3.5–5.2)
SODIUM: 142 mmol/L (ref 134–144)

## 2016-01-24 ENCOUNTER — Other Ambulatory Visit: Payer: Self-pay | Admitting: *Deleted

## 2016-01-24 ENCOUNTER — Telehealth: Payer: Self-pay | Admitting: Family Medicine

## 2016-01-24 MED ORDER — LORAZEPAM 0.5 MG PO TABS: ORAL_TABLET | ORAL | Status: DC

## 2016-01-25 ENCOUNTER — Other Ambulatory Visit: Payer: Self-pay | Admitting: Cardiology

## 2016-01-25 NOTE — Telephone Encounter (Signed)
REFILL 

## 2016-01-26 ENCOUNTER — Ambulatory Visit (HOSPITAL_COMMUNITY): Payer: Medicare Other | Attending: Cardiology

## 2016-01-26 DIAGNOSIS — I255 Ischemic cardiomyopathy: Secondary | ICD-10-CM | POA: Insufficient documentation

## 2016-01-26 DIAGNOSIS — Z951 Presence of aortocoronary bypass graft: Secondary | ICD-10-CM | POA: Diagnosis not present

## 2016-01-26 DIAGNOSIS — I119 Hypertensive heart disease without heart failure: Secondary | ICD-10-CM | POA: Diagnosis not present

## 2016-01-26 DIAGNOSIS — I071 Rheumatic tricuspid insufficiency: Secondary | ICD-10-CM | POA: Insufficient documentation

## 2016-02-02 ENCOUNTER — Encounter: Payer: Self-pay | Admitting: Cardiology

## 2016-02-02 ENCOUNTER — Ambulatory Visit (INDEPENDENT_AMBULATORY_CARE_PROVIDER_SITE_OTHER): Payer: Medicare Other | Admitting: Cardiology

## 2016-02-02 VITALS — BP 96/64 | HR 60 | Ht 64.0 in | Wt 159.0 lb

## 2016-02-02 DIAGNOSIS — I2510 Atherosclerotic heart disease of native coronary artery without angina pectoris: Secondary | ICD-10-CM

## 2016-02-02 MED ORDER — AMLODIPINE BESYLATE 5 MG PO TABS
5.0000 mg | ORAL_TABLET | Freq: Every day | ORAL | Status: DC
Start: 2016-02-02 — End: 2016-09-13

## 2016-02-02 NOTE — Patient Instructions (Signed)
Medication Instructions:  Please decrease your Amlodipine to 5 mg a day.  You may stop your Eliquis once you finish your current prescription. Continue all other medications as listed.  Follow-Up: Please follow up with Dr Percival Spanish in 1 month.  If you need a refill on your cardiac medications before your next appointment, please call your pharmacy.  Thank you for choosing [facility]!!

## 2016-02-02 NOTE — Progress Notes (Signed)
Cardiology Office Note   Date:  02/02/2016   ID:  Emily Wagner, DOB August 03, 1933, MRN [id number]  PCP:  Redge Gainer, MD  Cardiologist:   Minus Breeding, MD   No chief complaint on file.     History of Present Illness: Emily Wagner is a 82 y.o. female who presents for follow-up after bypass surgery. She's been seen by Dr. Roxy Manns and once by our PA after surgery. She's having a slow recovery. She did have an echocardiogram at the last visit. EF was well-preserved. There were no pericardial effusions. Is not able to do cardiac rehabilitation because she has to take care of her daughter who's had strokes. 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 She is limited in her activities because of some joint pains. However, she can do some chair exercises and some light walking and slowly build up. She has a little bit of sternal soreness. However, she's not having any symptoms that she had previously. She's not having any acute shortness of breath, PND or orthopnea. In particular she doesn't feel any of the palpitations or suggestion that she's had any recurrent atrial fibrillation and she had postoperatively. She's had a little bit of ankle edema. We talked around the phone several times and had reports of blood pressure being low. We reduced her lisinopril and discontinue her amiodarone because she felt poorly. She slowly recovering.  Past Medical History  Diagnosis Date  . Other and unspecified hyperlipidemia   . Benign hypertensive heart disease without heart failure   . Adjustment disorder with mixed anxiety and depressed mood   . Other dyspnea and respiratory abnormality   . Interstitial cystitis   . Osteopenia   . CAD (coronary artery disease)   . Pelvic fracture ([place]) 05/30/2015  . Infection of urinary tract 10/31/2015  . 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 Atrial fibrillation ([place]) 11/04/2015    Post-operative    Past Surgical History  Procedure  Laterality Date  . Fracture surgery      Right hip  . Cholecystectomy    . Hernia repair      Right inguinal  . Abdominal hysterectomy      Cancer and endometriosis  . Cataract extraction    . Cardiac catheterization  2008  . Cardiac catheterization N/A 10/29/2015    Procedure: Left Heart Cath and Coronary Angiography;  Surgeon: Belva Crome, MD;  Location: [place] [facility];  Service: Cardiovascular;  Laterality: N/A;  . Coronary artery bypass graft N/A 11/02/2015    Procedure: OFF PUMP CORONARY ARTERY BYPASS GRAFTING (CABG) TIMES ONE;  Surgeon: Rexene Alberts, MD;  Location: [facility];  Service: Open Heart Surgery;  Laterality: N/A;  LIMA to LAD  . Tee without cardioversion N/A 11/02/2015    Procedure: TRANSESOPHAGEAL ECHOCARDIOGRAM (TEE);  Surgeon: Rexene Alberts, MD;  Location: [place] [facility];  Service: Open Heart Surgery;  Laterality: N/A;     Current Outpatient Prescriptions  Medication Sig Dispense Refill  . amLODipine (NORVASC) 10 MG tablet Take 10 mg by mouth daily.    Marland Kitchen apixaban (ELIQUIS) 5 MG TABS tablet Take 1 tablet (5 mg total) by mouth 2 (two) times daily. 60 tablet 03  . aspirin EC 81 MG tablet Take 81 mg by mouth daily.    . Cholecalciferol (VITAMIN D) 2000 UNITS CAPS Take 2,000 Units by mouth daily.     . diphenhydrAMINE (BENADRYL) 25 MG tablet Take  12.5 mg by mouth daily.     . fexofenadine (ALLEGRA) 180 MG tablet Take 180 mg by mouth daily.    . hydrochlorothiazide (HYDRODIURIL) 25 MG tablet Take 25 mg by mouth daily.    Marland Kitchen levothyroxine (SYNTHROID, LEVOTHROID) 50 MCG tablet Take 50 mcg by mouth daily before breakfast.    . lisinopril (PRINIVIL,ZESTRIL) 10 MG tablet Take 1 tablet (10 mg total) by mouth daily. 30 tablet 3  . LORazepam (ATIVAN) 0.5 MG tablet TAKE 1 BY MOUTH BID PRN 180 tablet 0  . metoprolol (LOPRESSOR) 50 MG tablet Take 50 mg by mouth 2 (two) times daily.     . Multiple Vitamins-Minerals (PRESERVISION AREDS PO) Take 1 tablet by mouth daily.    . ciprofloxacin  (CIPRO) 500 MG tablet Take 1 tablet (500 mg total) by mouth daily with breakfast. (Patient not taking: Reported on 02/02/2016) 90 tablet 0   No current facility-administered medications for this visit.    Allergies:   Nitrofurantoin; Nsaids; Other; Flonase; Prevnar 13; Alendronate sodium; Ciprofloxacin; Codeine; Crestor; Darvon; Lipitor; Pravachol; Propranolol hcl; Sulfa antibiotics; Sulfamethoxazole; Ultram; Welchol; Zetia; and Zocor    ROS:  Please see the history of present illness.   Otherwise, review of systems are positive for none.   All other systems are reviewed and negative.    PHYSICAL EXAM: VS:  BP 96/64 mmHg  Pulse 60  Ht 5\' 4"  (1.626 m)  Wt 159 lb (72.122 kg)  BMI 27.28 kg/m2  LMP 03/21/1971 , BMI Body mass index is 27.28 kg/(m^2). GENERAL:  Well appearing HEENT:  Pupils equal round and reactive, fundi not visualized, oral mucosa unremarkable NECK:  No jugular venous distention, waveform within normal limits, carotid upstroke brisk and symmetric, no bruits, no thyromegaly LYMPHATICS:  No cervical, inguinal adenopathy LUNGS:  Clear to auscultation bilaterally BACK:  No CVA tenderness CHEST: Well healed sternotomy scar. HEART:  PMI not displaced or sustained,S1 and S2 within normal limits, no S3, no S4, no clicks, no rubs, no murmurs ABD:  Flat, positive bowel sounds normal in frequency in pitch, no bruits, no rebound, no guarding, no midline pulsatile mass, no hepatomegaly, no splenomegaly EXT:  2 plus pulses throughout, trace edema, no cyanosis no clubbing SKIN:  No rashes no nodules NEURO:  Cranial nerves II through XII grossly intact, motor grossly intact throughout PSYCH:  Cognitively intact, oriented to person place and time   EKG:  EKG is not ordered today.    Recent Labs: 11/06/2015: Magnesium 1.8 11/08/2015: Hemoglobin 13.0; TSH 2.845 11/16/2015: ALT 8; Platelets 474* 01/21/2016: BUN 25; Creatinine, Ser 1.23*; Potassium 4.0; Sodium 142    Lipid Panel      Component Value Date/Time   CHOL 212* 10/31/2015 0215   CHOL 260* 09/23/2014 1028   TRIG 99 10/31/2015 0215   TRIG 162* 06/24/2015 0918   HDL 39* 10/31/2015 0215   HDL 44 06/24/2015 0918   HDL 47 09/23/2014 1028   CHOLHDL 5.4 10/31/2015 0215   CHOLHDL 5.5* 09/23/2014 1028   VLDL 20 10/31/2015 0215   LDLCALC 153* 10/31/2015 0215   LDLCALC 182* 09/23/2014 1028   LDLCALC 163* 05/14/2014 1038      Wt Readings from Last 3 Encounters:  02/02/16 159 lb (72.122 kg)  12/28/15 157 lb (71.215 kg)  12/13/15 153 lb (69.4 kg)      Other studies Reviewed: Additional studies/ records that were reviewed today include: Office notes, echo Review of the above records demonstrates:  Please see elsewhere in the note.  ASSESSMENT AND PLAN:  CABG with LIMA-LAD:  She is doing OK.  I will change meds as below.  She is encouraged to do the activity she can.  HYPOTENSION:  Her blood pressures are running low. I did reduce her amlodipine to 5 mg daily. I'll probably reduce meds further if her blood pressure continues to run low. I reviewed her blood pressure diary  ATRIAL [id number].   Her fibrillation was postop. She can stop her anticoagulation when she finishes the current bottle and continue her aspirin.     Current medicines are reviewed at length with the patient today.  The patient does not have concerns regarding medicines.  The following changes have been made:  See above  Labs/ tests ordered today include:  No orders of the defined types were placed in this encounter.     Disposition:   FU with me in one month.    Signed, Minus Breeding, MD  02/02/2016 10:24 AM    [facility]

## 2016-02-08 ENCOUNTER — Other Ambulatory Visit: Payer: Medicare Other

## 2016-02-08 DIAGNOSIS — R300 Dysuria: Secondary | ICD-10-CM | POA: Diagnosis not present

## 2016-02-08 DIAGNOSIS — E785 Hyperlipidemia, unspecified: Secondary | ICD-10-CM

## 2016-02-08 LAB — URINALYSIS, COMPLETE
Bilirubin, UA: NEGATIVE
Glucose, UA: NEGATIVE
Ketones, UA: NEGATIVE
NITRITE UA: POSITIVE — AB
PH UA: 6.5 (ref 5.0–7.5)
PROTEIN UA: NEGATIVE
Specific Gravity, UA: 1.015 (ref 1.005–1.030)
UUROB: 0.2 mg/dL (ref 0.2–1.0)

## 2016-02-08 LAB — MICROSCOPIC EXAMINATION

## 2016-02-09 ENCOUNTER — Telehealth: Payer: Self-pay | Admitting: Family Medicine

## 2016-02-09 LAB — LIPID PANEL
CHOL/HDL RATIO: 5.1 ratio — AB (ref 0.0–4.4)
Cholesterol, Total: 219 mg/dL — ABNORMAL HIGH (ref 100–199)
HDL: 43 mg/dL (ref >39)
LDL Calculated: 156 mg/dL — ABNORMAL HIGH (ref 0–99)
TRIGLYCERIDES: 100 mg/dL (ref 0–149)
VLDL Cholesterol Cal: 20 mg/dL (ref 5–40)

## 2016-02-09 LAB — HEPATIC FUNCTION PANEL
ALBUMIN: 4 g/dL (ref 3.5–4.7)
ALT: 6 IU/L (ref 0–32)
AST: 13 IU/L (ref 0–40)
Alkaline Phosphatase: 106 IU/L (ref 39–117)
BILIRUBIN TOTAL: 0.4 mg/dL (ref 0.0–1.2)
BILIRUBIN, DIRECT: 0.11 mg/dL (ref 0.00–0.40)
Total Protein: 7.1 g/dL (ref 6.0–8.5)

## 2016-02-09 NOTE — Telephone Encounter (Signed)
Pt aware of lab results in lab encounter

## 2016-02-11 ENCOUNTER — Other Ambulatory Visit: Payer: Self-pay | Admitting: *Deleted

## 2016-02-11 LAB — URINE CULTURE

## 2016-02-11 MED ORDER — CEPHALEXIN 500 MG PO CAPS: 500.0000 mg | ORAL_CAPSULE | Freq: Three times a day (TID) | ORAL | Status: DC

## 2016-02-14 ENCOUNTER — Encounter: Payer: Self-pay | Admitting: Thoracic Surgery (Cardiothoracic Vascular Surgery)

## 2016-02-14 ENCOUNTER — Ambulatory Visit (INDEPENDENT_AMBULATORY_CARE_PROVIDER_SITE_OTHER): Payer: Medicare Other | Admitting: Thoracic Surgery (Cardiothoracic Vascular Surgery)

## 2016-02-14 VITALS — BP 112/73 | HR 65 | Resp 16 | Ht 64.0 in | Wt 154.0 lb

## 2016-02-14 DIAGNOSIS — I2510 Atherosclerotic heart disease of native coronary artery without angina pectoris: Secondary | ICD-10-CM

## 2016-02-14 DIAGNOSIS — Z951 Presence of aortocoronary bypass graft: Secondary | ICD-10-CM | POA: Diagnosis not present

## 2016-02-14 NOTE — Patient Instructions (Signed)
Continue all previous medications without any changes at this time  You may resume unrestricted physical activity without any particular limitations at this time.   

## 2016-02-14 NOTE — Progress Notes (Signed)
[address]       [place] [address]             [phone]     CARDIOTHORACIC SURGERY OFFICE NOTE  Referring Provider is Minus Breeding, MD PCP is Redge Gainer, MD   HPI:  Patient returns to the office today for routine follow-up status post off-pump coronary artery bypass grafting 1 on 11/02/2015. Her early postoperative recovery was notable for the development of postoperative atrial fibrillation. She converted to sinus rhythm on oral amiodarone. She was discharged from the hospital on warfarin anticoagulation and later switched to Eliquis.  She was last seen here in our office on 12/13/2015 at which time she was doing fairly well. Since then she has continued to do very nicely. She has been followed closely by Dr. Percival Spanish who saw her most recently on 02/02/2016. Follow-up echocardiogram was performed demonstrating normal left ventricular systolic function.  She was maintaining sinus rhythm and has been taken off of anticoagulation with Eliquis. The patient returns for office today for routine follow-up. She states that she feels well. She still has a burning sensation along the left anterior chest wall typical for patients with left internal mammary artery harvest and grafting. She otherwise has no significant discomfort. Her exercise tolerance has been gradually improving. She has not participated in the outpatient cardiac rehabilitation program because of the fact that she stays at home and cares for her daughter and she lives a long distance from any rehabilitation program. Overall she seems to be getting along quite well.   Current Outpatient Prescriptions  Medication Sig Dispense Refill  . amLODipine (NORVASC) 5 MG tablet Take 1 tablet (5 mg total) by mouth daily. 90 tablet 3  . aspirin EC 81 MG tablet Take 81 mg by mouth daily.    . cephALEXin (KEFLEX) 500 MG capsule Take 1 capsule (500 mg total) by mouth 3 (three) times daily. 30 capsule 0  .  Cholecalciferol (VITAMIN D) 2000 UNITS CAPS Take 2,000 Units by mouth daily.     . diphenhydrAMINE (BENADRYL) 25 MG tablet Take 12.5 mg by mouth daily.     . fexofenadine (ALLEGRA) 180 MG tablet Take 180 mg by mouth daily.    . hydrochlorothiazide (HYDRODIURIL) 25 MG tablet Take 25 mg by mouth daily.    Marland Kitchen levothyroxine (SYNTHROID, LEVOTHROID) 50 MCG tablet Take 50 mcg by mouth daily before breakfast.    . lisinopril (PRINIVIL,ZESTRIL) 10 MG tablet Take 1 tablet (10 mg total) by mouth daily. 30 tablet 3  . LORazepam (ATIVAN) 0.5 MG tablet Take 0.5 mg by mouth 2 (two) times daily.    . metoprolol (LOPRESSOR) 50 MG tablet Take 50 mg by mouth 2 (two) times daily.     . Multiple Vitamins-Minerals (PRESERVISION AREDS PO) Take 1 tablet by mouth daily.    . ciprofloxacin (CIPRO) 500 MG tablet Take 1 tablet (500 mg total) by mouth daily with breakfast. (Patient not taking: Reported on 02/02/2016) 90 tablet 0   No current facility-administered medications for this visit.      Physical Exam:   BP 112/73 mmHg  Pulse 65  Resp 16  Ht 5\' 4"  (1.626 m)  Wt 154 lb (69.854 kg)  BMI 26.42 kg/m2  SpO2 96%  LMP 03/21/1971  General:  Well-appearing  Chest:   Clear to auscultation  CV:   Regular rate and rhythm without murmur  Incisions:  Well healed, sternum is stable  Abdomen:  Soft and nontender  Extremities:  Warm and well-perfused  Diagnostic Tests:  Transthoracic Echocardiography  Patient: Emily Wagner, Emily Wagner MR #: [id number] Study Date: 01/26/2016 Gender: F Age: 82 Height: 162.6 cm Weight: 71.2 kg BSA: 1.81 m^2 Pt. Status: Room:  REFERRING Minus Breeding, MD ATTENDING Barrett, Evelene Croon ORDERING Barrett, Evelene Croon SONOGRAPHER Marygrace Drought, RCS PERFORMING [facility],  Outpatient  cc:  -------------------------------------------------------------------  ------------------------------------------------------------------- Indications: Cardiomyopathy (I25.5).  ------------------------------------------------------------------- History: PMH: Atrial fibrillation. Risk factors: Hypertension.  ------------------------------------------------------------------- Study Conclusions  - Left ventricle: The cavity size was normal. There was mild focal  basal and moderate concentric hypertrophy of the septum. Systolic  function was normal. Wall motion was normal; there were no  regional wall motion abnormalities. Doppler parameters are  consistent with abnormal left ventricular relaxation (grade 1  diastolic dysfunction). There was no evidence of elevated  ventricular filling pressure by Doppler parameters. - Aortic valve: There was no regurgitation. - Aortic root: The aortic root was normal in size. - Mitral valve: There was no regurgitation. - Left atrium: The atrium was normal in size. - Right ventricle: Systolic function was normal. - Right atrium: The atrium was normal in size. - Tricuspid valve: There was trivial regurgitation. - Pulmonary arteries: The main pulmonary artery was normal-sized.  Systolic pressure was within the normal range. - Inferior vena cava: The vessel was normal in size. The  respirophasic diameter changes were in the normal range (= 50%),  consistent with normal central venous pressure. - Pericardium, extracardiac: There was no pericardial effusion.  ------------------------------------------------------------------- Labs, prior tests, procedures, and surgery: Coronary artery bypass grafting.  Transthoracic echocardiography. M-mode, complete 2D, spectral Doppler, and color Doppler. Birthdate: Patient birthdate: 13-Jul-1933. Age: Patient is 82 yr old. Sex: Gender: female. BMI: 26.9 kg/m^2.  Blood pressure: 123/68 Patient status: Outpatient. Study date: Study date: 01/26/2016. Study time: 10:45 AM. Location: [facility]  -------------------------------------------------------------------  ------------------------------------------------------------------- Left ventricle: The cavity size was normal. There was mild focal basal and moderate concentric hypertrophy of the septum. Systolic function was normal. Wall motion was normal; there were no regional wall motion abnormalities. Doppler parameters are consistent with abnormal left ventricular relaxation (grade 1 diastolic dysfunction). There was no evidence of elevated ventricular filling pressure by Doppler parameters.  ------------------------------------------------------------------- Aortic valve: Trileaflet; mildly thickened, mildly calcified leaflets. Mobility was not restricted. Doppler: Transvalvular velocity was within the normal range. There was no stenosis. There was no regurgitation.  ------------------------------------------------------------------- Aorta: Aortic root: The aortic root was normal in size.  ------------------------------------------------------------------- Mitral valve: Structurally normal valve. Mobility was not restricted. Doppler: Transvalvular velocity was within the normal range. There was no evidence for stenosis. There was no regurgitation.  ------------------------------------------------------------------- Left atrium: The atrium was normal in size.  ------------------------------------------------------------------- Right ventricle: The cavity size was normal. Wall thickness was normal. Systolic function was normal.  ------------------------------------------------------------------- Pulmonic valve: Doppler: Transvalvular velocity was within the normal range. There was no evidence for  stenosis.  ------------------------------------------------------------------- Tricuspid valve: Structurally normal valve. Doppler: Transvalvular velocity was within the normal range. There was trivial regurgitation.  ------------------------------------------------------------------- Pulmonary artery: The main pulmonary artery was normal-sized. Systolic pressure was within the normal range.  ------------------------------------------------------------------- Right atrium: The atrium was normal in size.  ------------------------------------------------------------------- Pericardium: There was no pericardial effusion.  ------------------------------------------------------------------- Systemic veins: Inferior vena cava: The vessel was normal in size. The respirophasic diameter changes were in the normal range (= 50%), consistent with normal central venous pressure. Diameter: 15 mm.  ------------------------------------------------------------------- Measurements  IVC Value Reference ID 15 mm ---------  Left ventricle  Value Reference LV ID, ED, PLAX chordal (L) 38.1 mm 43 - 52 LV ID, ES, PLAX chordal 28.6 mm 23 - 38 LV fx shortening, PLAX chordal (L) 25 % >=29 LV PW thickness, ED 11.6 mm --------- IVS/LV PW ratio, ED (H) 1.35 <=1.3 Stroke volume, 2D 89 ml --------- Stroke volume/bsa, 2D 49 ml/m^2 --------- LV ejection fraction, 1-p A4C 72 % --------- LV e&', lateral 6.36 cm/s --------- LV E/e&', lateral 8.54 --------- LV e&',  medial 8.44 cm/s --------- LV E/e&', medial 6.43 --------- LV e&', average 7.4 cm/s --------- LV E/e&', average 7.34 ---------  Ventricular septum Value Reference IVS thickness, ED 15.7 mm ---------  LVOT Value Reference LVOT ID, S 20 mm --------- LVOT area 3.14 cm^2 --------- LVOT peak velocity, S 104 cm/s --------- LVOT mean velocity, S 74.5 cm/s --------- LVOT VTI, S 28.3 cm --------- LVOT peak gradient, S 4 mm Hg ---------  Aorta Value Reference Aortic root ID, ED 29 mm ---------  Left atrium Value Reference LA ID, A-P, ES 35 mm --------- LA ID/bsa, A-P 1.93 cm/m^2 <=2.2 LA volume, S 37 ml --------- LA volume/bsa, S 20.4 ml/m^2 --------- LA volume, ES, 1-p A4C 26 ml --------- LA volume/bsa, ES, 1-p A4C 14.4 ml/m^2 --------- LA volume, ES, 1-p A2C 45 ml --------- LA volume/bsa, ES, 1-p A2C 24.9 ml/m^2 ---------  Mitral valve Value Reference Mitral E-wave peak velocity 54.3 cm/s --------- Mitral A-wave peak velocity 68.6 cm/s --------- Mitral deceleration time (H) 303 ms 150 -  230 Mitral E/A ratio, peak 0.8 ---------  Pulmonary arteries Value Reference PA pressure, S, DP 28 mm Hg <=30  Tricuspid valve Value Reference Tricuspid regurg peak velocity 252 cm/s --------- Tricuspid peak RV-RA gradient 25 mm Hg --------- Tricuspid maximal regurg velocity, 252 cm/s --------- PISA  Systemic veins Value Reference Estimated CVP 3 mm Hg ---------  Right ventricle Value Reference RV pressure, S, DP 28 mm Hg <=30 RV s&', lateral, S 8.77 cm/s ---------  Legend: (L) and (H) mark values outside specified reference range.  ------------------------------------------------------------------- Prepared and Electronically Authenticated by  Ena Dawley, M.D. 2017-03-01T17:48:27   Impression:  Patient is doing very well approximately 3 months status post off pump coronary artery bypass grafting 1.  Plan:  I have encouraged the patient to continue to gradually increase her physical activity as tolerated without any particular limitations. The numerous potential benefits of regular exercise and a heart healthy diet have been emphasized. All of her questions have been addressed. The patient will return for routine follow-up next December, approximately 1 year following her surgery. During the interim period of time she will call and return only should specific problems or difficulties arise.  I spent in excess of 15 minutes during the conduct of this office consultation and >50% of this time involved direct face-to-face encounter with the patient for counseling and/or coordination of their care.    Valentina Gu.  Roxy Manns, MD 02/14/2016 1:28 PM

## 2016-02-19 ENCOUNTER — Other Ambulatory Visit: Payer: Self-pay | Admitting: Cardiology

## 2016-02-21 NOTE — Telephone Encounter (Signed)
Done

## 2016-02-29 NOTE — Progress Notes (Signed)
Cardiology Office Note   Date:  02/29/2016   ID:  Emily Wagner, DOB 02/20/1933, MRN [id number]  PCP:  Redge Gainer, MD  Cardiologist:   Minus Breeding, MD   No chief complaint on file.     History of Present Illness: Emily Wagner is a 82 y.o. female who presents for follow-up after bypass surgery. Since I last saw her she is again seen Dr. Roxy Manns. She's done quite well. She takes care of her daughter who has had strokes. She is doing activities and was able to ride a lawnmower to do well on. She's less sore.   The patient denies any new symptoms such as chest discomfort, neck or arm discomfort. There has been no new shortness of breath, PND or orthopnea. There have been no reported palpitations, presyncope or syncope.   Past Medical History  Diagnosis Date  . Other and unspecified hyperlipidemia   . Benign hypertensive heart disease without heart failure   . Adjustment disorder with mixed anxiety and depressed mood   . Other dyspnea and respiratory abnormality   . Interstitial cystitis   . Osteopenia   . CAD (coronary artery disease)   . Pelvic fracture ([place]) 05/30/2015  . Infection of urinary tract 10/31/2015  . 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 Atrial fibrillation ([facility]) 11/04/2015    Post-operative    Past Surgical History  Procedure Laterality Date  . Fracture surgery      Right hip  . Cholecystectomy    . Hernia repair      Right inguinal  . Abdominal hysterectomy      Cancer and endometriosis  . Cataract extraction    . Cardiac catheterization  2008  . Cardiac catheterization N/A 10/29/2015    Procedure: Left Heart Cath and Coronary Angiography;  Surgeon: Belva Crome, MD;  Location: [facility];  Service: Cardiovascular;  Laterality: N/A;  . Coronary artery bypass graft N/A 11/02/2015    Procedure: OFF PUMP CORONARY ARTERY BYPASS GRAFTING (CABG) TIMES ONE;  Surgeon: Rexene Alberts, MD;  Location: [facility];  Service: Open Heart Surgery;   Laterality: N/A;  LIMA to LAD  . Tee without cardioversion N/A 11/02/2015    Procedure: TRANSESOPHAGEAL ECHOCARDIOGRAM (TEE);  Surgeon: Rexene Alberts, MD;  Location: [facility];  Service: Open Heart Surgery;  Laterality: N/A;     Current Outpatient Prescriptions  Medication Sig Dispense Refill  . amLODipine (NORVASC) 5 MG tablet Take 1 tablet (5 mg total) by mouth daily. 90 tablet 3  . aspirin EC 81 MG tablet Take 81 mg by mouth daily.    . cephALEXin (KEFLEX) 500 MG capsule Take 1 capsule (500 mg total) by mouth 3 (three) times daily. 30 capsule 0  . Cholecalciferol (VITAMIN D) 2000 UNITS CAPS Take 2,000 Units by mouth daily.     . ciprofloxacin (CIPRO) 500 MG tablet Take 1 tablet (500 mg total) by mouth daily with breakfast. (Patient not taking: Reported on 02/02/2016) 90 tablet 0  . diphenhydrAMINE (BENADRYL) 25 MG tablet Take 12.5 mg by mouth daily.     . fexofenadine (ALLEGRA) 180 MG tablet Take 180 mg by mouth daily.    . hydrochlorothiazide (HYDRODIURIL) 25 MG tablet Take 25 mg by mouth daily.    . hydrochlorothiazide (HYDRODIURIL) 25 MG tablet TAKE 1 TABLET EVERY DAY 30 tablet 3  . levothyroxine (SYNTHROID, LEVOTHROID) 50 MCG tablet Take 50 mcg by mouth daily  before breakfast.    . lisinopril (PRINIVIL,ZESTRIL) 10 MG tablet Take 1 tablet (10 mg total) by mouth daily. 30 tablet 3  . LORazepam (ATIVAN) 0.5 MG tablet Take 0.5 mg by mouth 2 (two) times daily.    . metoprolol (LOPRESSOR) 50 MG tablet Take 50 mg by mouth 2 (two) times daily.     . Multiple Vitamins-Minerals (PRESERVISION AREDS PO) Take 1 tablet by mouth daily.     No current facility-administered medications for this visit.    Allergies:   Nitrofurantoin; Nsaids; Other; Flonase; Prevnar 13; Alendronate sodium; Ciprofloxacin; Codeine; Crestor; Darvon; Lipitor; Pravachol; Propranolol hcl; Sulfa antibiotics; Sulfamethoxazole; Ultram; Welchol; Zetia; and Zocor    ROS:  Please see the history of present illness.   Otherwise,  review of systems are positive for none.   All other systems are reviewed and negative.    PHYSICAL EXAM: VS:  LMP 03/21/1971 , BMI There is no weight on file to calculate BMI. GENERAL:  Well appearing NECK:  No jugular venous distention, waveform within normal limits, carotid upstroke brisk and symmetric, no bruits, no thyromegaly LUNGS:  Clear to auscultation bilaterally BACK:  No CVA tenderness CHEST: Well healed sternotomy scar. HEART:  PMI not displaced or sustained,S1 and S2 within normal limits, no S3, no S4, no clicks, no rubs, no murmurs ABD:  Flat, positive bowel sounds normal in frequency in pitch, no bruits, no rebound, no guarding, no midline pulsatile mass, no hepatomegaly, no splenomegaly EXT:  2 plus pulses throughout, trace edema, no cyanosis no clubbing   EKG:  EKG is ordered today. Sinus rhythm, rate 59, axis within normal limits, poor anterior R-wave progression, no acute ST-T wave changes   Recent Labs: 11/06/2015: Magnesium 1.8 11/08/2015: Hemoglobin 13.0; TSH 2.845 11/16/2015: Platelets 474* 01/21/2016: BUN 25; Creatinine, Ser 1.23*; Potassium 4.0; Sodium 142 02/08/2016: ALT 6    Lipid Panel    Component Value Date/Time   CHOL 219* 02/08/2016 0937   CHOL 212* 10/31/2015 0215   TRIG 100 02/08/2016 0937   TRIG 162* 06/24/2015 0918   HDL 43 02/08/2016 0937   HDL 39* 10/31/2015 0215   HDL 44 06/24/2015 0918   CHOLHDL 5.1* 02/08/2016 0937   CHOLHDL 5.4 10/31/2015 0215   VLDL 20 10/31/2015 0215   LDLCALC 156* 02/08/2016 0937   LDLCALC 153* 10/31/2015 0215   LDLCALC 163* 05/14/2014 1038      Wt Readings from Last 3 Encounters:  02/14/16 154 lb (69.854 kg)  02/02/16 159 lb (72.122 kg)  12/28/15 157 lb (71.215 kg)      Other studies Reviewed: Additional studies/ records that were reviewed today include: None   Review of the above records demonstrates:     ASSESSMENT AND PLAN:  CABG with LIMA-LAD:  She is doing OK.  I will change meds as below.   She is encouraged to do the activity she can.  HYPOTENSION:  Her blood pressure was running low but reduced her Norvasc. Blood pressure diary today shows he is an excellent range. No change in therapy is indicated.  ATRIAL [id number].   Her fibrillation was postop. She can stop her anticoagulation when she finishes the current bottle and continue her aspirin.     Current medicines are reviewed at length with the patient today.  The patient does not have concerns regarding medicines.  The following changes have been made:  See above  Labs/ tests ordered today include:  No orders of the defined types were placed in this encounter.  Disposition:   FU with me in one month.    Signed, Minus Breeding, MD  02/29/2016 6:07 PM    [place]

## 2016-03-01 ENCOUNTER — Encounter: Payer: Self-pay | Admitting: Cardiology

## 2016-03-01 ENCOUNTER — Ambulatory Visit (INDEPENDENT_AMBULATORY_CARE_PROVIDER_SITE_OTHER): Payer: Medicare Other | Admitting: Cardiology

## 2016-03-01 VITALS — BP 120/68 | HR 60 | Ht 64.0 in | Wt 159.0 lb

## 2016-03-01 DIAGNOSIS — I10 Essential (primary) hypertension: Secondary | ICD-10-CM | POA: Diagnosis not present

## 2016-03-01 NOTE — Patient Instructions (Signed)
Medication Instructions:  The current medical regimen is effective;  continue present plan and medications.  Follow-Up: Follow up in 6 months with Dr. Hochrein.  You will receive a letter in the mail 2 months before you are due.  Please call us when you receive this letter to schedule your follow up appointment.  If you need a refill on your cardiac medications before your next appointment, please call your pharmacy.  Thank you for choosing [facility]!!       

## 2016-03-03 ENCOUNTER — Other Ambulatory Visit: Payer: Medicare Other

## 2016-03-03 DIAGNOSIS — N3010 Interstitial cystitis (chronic) without hematuria: Secondary | ICD-10-CM | POA: Diagnosis not present

## 2016-03-03 LAB — URINALYSIS, COMPLETE
Bilirubin, UA: NEGATIVE
Glucose, UA: NEGATIVE
Nitrite, UA: NEGATIVE
PH UA: 7 (ref 5.0–7.5)
Protein, UA: NEGATIVE
SPEC GRAV UA: 1.02 (ref 1.005–1.030)
Urobilinogen, Ur: 1 mg/dL (ref 0.2–1.0)

## 2016-03-03 LAB — MICROSCOPIC EXAMINATION: Epithelial Cells (non renal): >10 /hpf — AB (ref 0–10)

## 2016-03-05 LAB — URINE CULTURE

## 2016-03-06 MED ORDER — AMOXICILLIN-POT CLAVULANATE 875-125 MG PO TABS: 1.0000 | ORAL_TABLET | Freq: Two times a day (BID) | ORAL | Status: DC

## 2016-03-06 NOTE — Addendum Note (Signed)
Addended by: Wardell Heath on: 03/06/2016 11:55 AM   Modules accepted: Orders

## 2016-03-16 ENCOUNTER — Other Ambulatory Visit: Payer: Medicare Other

## 2016-03-16 DIAGNOSIS — M7989 Other specified soft tissue disorders: Secondary | ICD-10-CM | POA: Diagnosis not present

## 2016-03-16 DIAGNOSIS — Z87440 Personal history of urinary (tract) infections: Secondary | ICD-10-CM | POA: Diagnosis not present

## 2016-03-16 LAB — MICROSCOPIC EXAMINATION: Epithelial Cells (non renal): >10 /hpf — AB (ref 0–10)

## 2016-03-16 LAB — URINALYSIS, COMPLETE
Bilirubin, UA: NEGATIVE
GLUCOSE, UA: NEGATIVE
Ketones, UA: NEGATIVE
Nitrite, UA: NEGATIVE
PH UA: 6 (ref 5.0–7.5)
PROTEIN UA: NEGATIVE
Specific Gravity, UA: 1.02 (ref 1.005–1.030)
Urobilinogen, Ur: 0.2 mg/dL (ref 0.2–1.0)

## 2016-03-17 ENCOUNTER — Telehealth: Payer: Self-pay | Admitting: Family Medicine

## 2016-03-17 LAB — BMP8+EGFR
BUN / CREAT RATIO: 18 (ref 12–28)
BUN: 21 mg/dL (ref 8–27)
CHLORIDE: 98 mmol/L (ref 96–106)
CO2: 25 mmol/L (ref 18–29)
Calcium: 9.6 mg/dL (ref 8.7–10.3)
Creatinine, Ser: 1.15 mg/dL — ABNORMAL HIGH (ref 0.57–1.00)
GFR calc non Af Amer: 45 mL/min/{1.73_m2} — ABNORMAL LOW (ref >59)
GFR, EST AFRICAN AMERICAN: 52 mL/min/{1.73_m2} — AB (ref >59)
Glucose: 85 mg/dL (ref 65–99)
POTASSIUM: 4.1 mmol/L (ref 3.5–5.2)
SODIUM: 143 mmol/L (ref 134–144)

## 2016-03-17 NOTE — Telephone Encounter (Signed)
Notes Recorded by Chipper Herb, MD on 03/17/2016 at 11:29 AM I would highly recommend the urology referral Notes Recorded by Dannial Monarch, LPN on [id number] at 075-GRM AM Patient aware of results. States that she does not want to see a urologist at this time. Patient states that if she changes her mind she will call back Notes Recorded by Chipper Herb, MD on 03/17/2016 at 7:07 AM The urinalysis still has infection. There are also red blood cells present more than would be expected. Please schedule this patient for an appointment to see her urologist for follow-up++++++

## 2016-03-18 LAB — URINE CULTURE

## 2016-03-20 ENCOUNTER — Telehealth: Payer: Self-pay | Admitting: Family Medicine

## 2016-03-20 NOTE — Telephone Encounter (Signed)
Do you want pt do be on an antibiotic (urine Culture)?  She cant get in until 04/21/16 with Dr Jeffie Pollock - (this is what they told us would be the soonest - she is considered NEW)  E- Coli was on culture

## 2016-03-20 NOTE — Telephone Encounter (Signed)
Please try to get her in with anyone that can see her there not just Dr. Jeffie Pollock

## 2016-03-20 NOTE — Telephone Encounter (Signed)
Emily Wagner is taking care of this, will close encounter.

## 2016-03-21 ENCOUNTER — Telehealth: Payer: Self-pay | Admitting: Family Medicine

## 2016-03-21 NOTE — Telephone Encounter (Signed)
Pt aware = per [facility] hold off on treatment until she sees urology - we may try and see if there are any cancellations next week

## 2016-03-21 NOTE — Telephone Encounter (Signed)
Pt aware that i spoke with Triage nurse at [facility]  And that nurse will be contacting her with a sooner appt.

## 2016-03-21 NOTE — Telephone Encounter (Signed)
Perfect

## 2016-03-28 DIAGNOSIS — Z0000 Encounter for general adult medical examination without abnormal findings: Secondary | ICD-10-CM | POA: Diagnosis not present

## 2016-03-28 DIAGNOSIS — N3020 Other chronic cystitis without hematuria: Secondary | ICD-10-CM | POA: Diagnosis not present

## 2016-04-21 ENCOUNTER — Other Ambulatory Visit: Payer: Self-pay

## 2016-04-21 ENCOUNTER — Other Ambulatory Visit: Payer: Self-pay | Admitting: Cardiology

## 2016-04-21 MED ORDER — HYDROCHLOROTHIAZIDE 25 MG PO TABS: 25.0000 mg | ORAL_TABLET | Freq: Every day | ORAL | Status: DC

## 2016-04-21 MED ORDER — LISINOPRIL 10 MG PO TABS: 10.0000 mg | ORAL_TABLET | Freq: Every day | ORAL | Status: DC

## 2016-04-21 NOTE — Telephone Encounter (Signed)
REFILL 

## 2016-05-08 DIAGNOSIS — R3121 Asymptomatic microscopic hematuria: Secondary | ICD-10-CM | POA: Diagnosis not present

## 2016-05-08 DIAGNOSIS — N3021 Other chronic cystitis with hematuria: Secondary | ICD-10-CM | POA: Diagnosis not present

## 2016-05-08 DIAGNOSIS — N281 Cyst of kidney, acquired: Secondary | ICD-10-CM | POA: Diagnosis not present

## 2016-05-10 ENCOUNTER — Ambulatory Visit (INDEPENDENT_AMBULATORY_CARE_PROVIDER_SITE_OTHER): Payer: Medicare Other | Admitting: Family Medicine

## 2016-05-10 ENCOUNTER — Encounter: Payer: Self-pay | Admitting: Family Medicine

## 2016-05-10 VITALS — BP 151/91 | HR 58 | Temp 97.3°F | Ht 64.0 in | Wt 160.0 lb

## 2016-05-10 DIAGNOSIS — I480 Paroxysmal atrial fibrillation: Secondary | ICD-10-CM

## 2016-05-10 DIAGNOSIS — I10 Essential (primary) hypertension: Secondary | ICD-10-CM

## 2016-05-10 DIAGNOSIS — E7800 Pure hypercholesterolemia, unspecified: Secondary | ICD-10-CM | POA: Diagnosis not present

## 2016-05-10 DIAGNOSIS — I119 Hypertensive heart disease without heart failure: Secondary | ICD-10-CM

## 2016-05-10 DIAGNOSIS — E039 Hypothyroidism, unspecified: Secondary | ICD-10-CM | POA: Diagnosis not present

## 2016-05-10 DIAGNOSIS — S32501D Unspecified fracture of right pubis, subsequent encounter for fracture with routine healing: Secondary | ICD-10-CM | POA: Diagnosis not present

## 2016-05-10 DIAGNOSIS — J301 Allergic rhinitis due to pollen: Secondary | ICD-10-CM | POA: Diagnosis not present

## 2016-05-10 DIAGNOSIS — I2510 Atherosclerotic heart disease of native coronary artery without angina pectoris: Secondary | ICD-10-CM

## 2016-05-10 DIAGNOSIS — E559 Vitamin D deficiency, unspecified: Secondary | ICD-10-CM

## 2016-05-10 DIAGNOSIS — L989 Disorder of the skin and subcutaneous tissue, unspecified: Secondary | ICD-10-CM | POA: Diagnosis not present

## 2016-05-10 DIAGNOSIS — S32591D Other specified fracture of right pubis, subsequent encounter for fracture with routine healing: Secondary | ICD-10-CM

## 2016-05-10 MED ORDER — LEVOTHYROXINE SODIUM 50 MCG PO TABS: 50.0000 ug | ORAL_TABLET | Freq: Every day | ORAL | Status: DC

## 2016-05-10 MED ORDER — LORAZEPAM 0.5 MG PO TABS: 0.5000 mg | ORAL_TABLET | Freq: Two times a day (BID) | ORAL | Status: DC

## 2016-05-10 NOTE — Progress Notes (Signed)
Subjective:    Patient ID: Emily Wagner, female    DOB: 1933-01-13, 82 y.o.   MRN: [id number]  HPI Pt here for follow up and management of chronic medical problems which includes hypothyroid and hyperlipidemia. She is taking medications regularly.She is doing well overall. She has concerns about 3 lesions on her face. She's had a CABG done recently and follows up regularly with the cardiologist. She had pelvic fractures in the past and is doing well with this. She also has a history of interstitial cystitis and periodically sees the urologist. She is requesting refills on her thyroid medicine and lorazepam. She is due to get a DEXA scanner and this will be done at her next visit. She will be given an FOBT to return and will return to the office fasting for labwork. The patient sees the cardiologist every 3 months. She just saw the urologist and he told her to come back on an as-needed basis. She denies any chest pain chest tightness or increased shortness of breath. She still has some fatigue but is getting better with this. She is active. She denies the chest pain or shortness of breath. She has some problems with reflux at times and some nausea and she attributes some of this to lisinopril. She continues to take this. She denies any blood in the stool or black tarry bowel movements and she is passing her water without problems. She will come back this Saturday for her lab work.     Patient Active Problem List   Diagnosis Date Noted  . Long term (current) use of anticoagulants 11/11/2015  . Atrial fibrillation ([place]) 11/04/2015  . 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 Infection of urinary tract 10/31/2015  . Unstable angina ([place]) 10/29/2015  . CAD (coronary artery disease) 10/29/2015  . Benign hypertensive heart disease without heart failure   . Interstitial cystitis   . Osteopenia   . Pelvic fracture ([place]) 05/30/2015  . Allergy to multiple medications 07/15/2013  . Renal insufficiency     . Adjustment disorder with mixed anxiety and depressed mood   . Osteoporosis   . ASCVD (arteriosclerotic cardiovascular disease)   . 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  . HYPERCHOLESTEROLEMIA 05/02/2008  . Essential hypertension 05/02/2008  . INTERSTITIAL CYSTITIS 05/02/2008  . DIVERTICULOSIS OF COLON 08/27/2003   Outpatient Encounter Prescriptions as of 05/10/2016  Medication Sig  . amLODipine (NORVASC) 5 MG tablet Take 1 tablet (5 mg total) by mouth daily.  Marland Kitchen aspirin EC 81 MG tablet Take 81 mg by mouth daily.  . Cholecalciferol (VITAMIN D) 2000 UNITS CAPS Take 2,000 Units by mouth daily.   . ciprofloxacin (CIPRO) 500 MG tablet Take 1 tablet (500 mg total) by mouth daily with breakfast.  . diphenhydrAMINE (BENADRYL) 25 MG tablet Take 12.5 mg by mouth daily.   . fexofenadine (ALLEGRA) 180 MG tablet Take 180 mg by mouth daily.  . hydrochlorothiazide (HYDRODIURIL) 25 MG tablet Take 1 tablet (25 mg total) by mouth daily.  Marland Kitchen levothyroxine (SYNTHROID, LEVOTHROID) 50 MCG tablet Take 50 mcg by mouth daily before breakfast.  . lisinopril (PRINIVIL,ZESTRIL) 10 MG tablet Take 1 tablet (10 mg total) by mouth daily.  Marland Kitchen LORazepam (ATIVAN) 0.5 MG tablet Take 0.5 mg by mouth 2 (two) times daily.  . metoprolol (LOPRESSOR) 50 MG tablet TAKE 1 TABLET 3 TIMES A DAY  . Multiple Vitamins-Minerals (PRESERVISION AREDS PO) Take 1 tablet by mouth daily.  . [DISCONTINUED] amoxicillin-clavulanate (AUGMENTIN) 875-125 MG tablet Take 1 tablet by  mouth 2 (two) times daily.   No facility-administered encounter medications on file as of 05/10/2016.      Review of Systems  Constitutional: Negative.   HENT: Negative.   Eyes: Negative.   Respiratory: Negative.   Cardiovascular: Negative.   Gastrointestinal: Negative.   Endocrine: Negative.   Genitourinary: Negative.   Musculoskeletal: Negative.   Skin: Negative.        3 lesions -face  Allergic/Immunologic: Negative.   Neurological: Negative.   Hematological:  Negative.   Psychiatric/Behavioral: Negative.        Objective:   Physical Exam  Constitutional: She is oriented to person, place, and time. She appears well-developed and well-nourished.  HENT:  Head: Normocephalic and atraumatic.  Right Ear: External ear normal.  Left Ear: External ear normal.  Mouth/Throat: Oropharynx is clear and moist. No oropharyngeal exudate (nasal congestion).  Nasal congestion bilaterally  Eyes: Conjunctivae and EOM are normal. Pupils are equal, round, and reactive to light. Right eye exhibits no discharge. Left eye exhibits no discharge. No scleral icterus.  Neck: Normal range of motion. Neck supple. No thyromegaly present.  No bruits thyromegaly or anterior cervical adenopathy  Cardiovascular: Normal rate, regular rhythm, normal heart sounds and intact distal pulses.   No murmur heard. Heart is regular at 60/m  Pulmonary/Chest: Effort normal and breath sounds normal. No respiratory distress. She has no wheezes. She has no rales. She exhibits no tenderness.  Abdominal: Soft. Bowel sounds are normal. She exhibits no mass. There is no tenderness. There is no rebound and no guarding.  Musculoskeletal: Normal range of motion. She exhibits no edema or tenderness.  Lymphadenopathy:    She has no cervical adenopathy.  Neurological: She is alert and oriented to person, place, and time. She has normal reflexes. No cranial nerve deficit.  Skin: Skin is warm and dry. No rash noted.  Psychiatric: She has a normal mood and affect. Her behavior is normal. Judgment and thought content normal.  Nursing note and vitals reviewed.  BP 151/91 mmHg  Pulse 58  Temp(Src) 97.3 F (36.3 C) (Oral)  Ht '5\' 4"'$  (1.626 m)  Wt 160 lb (72.576 kg)  BMI 27.45 kg/m2  LMP 03/21/1971        Assessment & Plan:  1. Essential hypertension -The blood pressure is only slightly elevated today and he'll be no change in treatment. - CBC with Differential/Platelet; Future - BMP8+EGFR;  Future - Hepatic function panel; Future - Lipid panel; Future  2. Paroxysmal atrial fibrillation (HCC) -Continue to follow-up with cardiology - CBC with Differential/Platelet; Future  3. Hypothyroidism, unspecified hypothyroidism type -Continue current treatment pending results of lab work - CBC with Differential/Platelet; Future  4. Vitamin D deficiency -Continue current treatment pending results of lab work - CBC with Differential/Platelet; Future - VITAMIN D 25 Hydroxy (Vit-D Deficiency, Fractures); Future  5. ASCVD (arteriosclerotic cardiovascular disease) -The patient is status post bypass surgery and she will continue to follow-up with cardiology as directed - CBC with Differential/Platelet; Future  6. HYPERCHOLESTEROLEMIA -The patient is statin intolerant and she will continue to follow-up aggressive therapeutic lifestyle changes and follow-up with cardiology - CBC with Differential/Platelet; Future - Lipid panel; Future  7. Benign hypertensive heart disease without heart failure -Follow-up with cardiology as planned  8. Skin lesion of face - Ambulatory referral to Dermatology  9. Allergic rhinitis due to pollen -Flonase 1 spray each nostril at bedtime and nasal saline frequently during the day   10. Fracture of multiple pubic rami, right, with routine  healing, subsequent encounter -Patient has recovered well from the pelvic fracture and she continues to take her vitamin D and calcium replacement and is more careful about putting herself at risk for falling  Meds ordered this encounter  Medications  . LORazepam (ATIVAN) 0.5 MG tablet    Sig: Take 1 tablet (0.5 mg total) by mouth 2 (two) times daily.    Dispense:  30 tablet    Refill:  3  . levothyroxine (SYNTHROID, LEVOTHROID) 50 MCG tablet    Sig: Take 1 tablet (50 mcg total) by mouth daily before breakfast.    Dispense:  90 tablet    Refill:  3   Patient Instructions                       Medicare Annual  Wellness Visit  [facility] and the medical providers at [facility] strive to bring you the best medical care.  In doing so we not only want to address your current medical conditions and concerns but also to detect new conditions early and prevent illness, disease and health-related problems.    Medicare offers a yearly Wellness Visit which allows our clinical staff to assess your need for preventative services including immunizations, lifestyle education, counseling to decrease risk of preventable diseases and screening for fall risk and other medical concerns.    This visit is provided free of charge (no copay) for all Medicare recipients. The clinical pharmacists at [facility] have begun to conduct these Wellness Visits which will also include a thorough review of all your medications.    As you primary medical provider recommend that you make an appointment for your Annual Wellness Visit if you have not done so already this year.  You may set up this appointment before you leave today or you may call back [phone]) and schedule an appointment.  Please make sure when you call that you mention that you are scheduling your Annual Wellness Visit with the clinical pharmacist so that the appointment may be made for the proper length of time.     Continue current medications. Continue good therapeutic lifestyle changes which include good diet and exercise. Fall precautions discussed with patient. If an FOBT was given today- please return it to our front desk. If you are over 45 years old - you may need Prevnar 26 or the adult Pneumonia vaccine.  After your visit with [name] today you will receive a survey in the mail or online from [employer] regarding your care with [name]. Please take a moment to fill this out. Your feedback is very important to [name] as you can help [name] better understand your patient needs as well as improve your experience and satisfaction. WE  CARE ABOUT YOU!!!   Continue to follow-up with cardiology We will make arrangements for you to see the dermatologist regarding the skin lesions on her face Follow-up with urology as needed The patient should use or try Flonase 1 spray each nostril at bedtime and use nasal saline during the day.   Arrie Senate MD

## 2016-05-10 NOTE — Patient Instructions (Addendum)
Medicare Annual Wellness Visit  [facility] and the medical providers at [facility] strive to bring you the best medical care.  In doing so we not only want to address your current medical conditions and concerns but also to detect new conditions early and prevent illness, disease and health-related problems.    Medicare offers a yearly Wellness Visit which allows our clinical staff to assess your need for preventative services including immunizations, lifestyle education, counseling to decrease risk of preventable diseases and screening for fall risk and other medical concerns.    This visit is provided free of charge (no copay) for all Medicare recipients. The clinical pharmacists at [facility] have begun to conduct these Wellness Visits which will also include a thorough review of all your medications.    As you primary medical provider recommend that you make an appointment for your Annual Wellness Visit if you have not done so already this year.  You may set up this appointment before you leave today or you may call back [phone] and schedule an appointment.  Please make sure when you call that you mention that you are scheduling your Annual Wellness Visit with the clinical pharmacist so that the appointment may be made for the proper length of time.     Continue current medications. Continue good therapeutic lifestyle changes which include good diet and exercise. Fall precautions discussed with patient. If an FOBT was given today- please return it to our front desk. If you are over 82 years old - you may need Prevnar 85 or the adult Pneumonia vaccine.  After your visit with [name] today you will receive a survey in the mail or online from [employer] regarding your care with [name]. Please take a moment to fill this out. Your feedback is very important to [name] as you can help [name] better understand your patient needs as well as improve  your experience and satisfaction. WE CARE ABOUT YOU!!!   Continue to follow-up with cardiology We will make arrangements for you to see the dermatologist regarding the skin lesions on her face Follow-up with urology as needed The patient should use or try Flonase 1 spray each nostril at bedtime and use nasal saline during the day.

## 2016-05-13 ENCOUNTER — Other Ambulatory Visit: Payer: Medicare Other

## 2016-05-13 DIAGNOSIS — E7800 Pure hypercholesterolemia, unspecified: Secondary | ICD-10-CM

## 2016-05-13 DIAGNOSIS — I10 Essential (primary) hypertension: Secondary | ICD-10-CM | POA: Diagnosis not present

## 2016-05-13 DIAGNOSIS — I2510 Atherosclerotic heart disease of native coronary artery without angina pectoris: Secondary | ICD-10-CM | POA: Diagnosis not present

## 2016-05-13 DIAGNOSIS — I480 Paroxysmal atrial fibrillation: Secondary | ICD-10-CM

## 2016-05-13 DIAGNOSIS — E039 Hypothyroidism, unspecified: Secondary | ICD-10-CM

## 2016-05-13 DIAGNOSIS — E559 Vitamin D deficiency, unspecified: Secondary | ICD-10-CM | POA: Diagnosis not present

## 2016-05-15 LAB — HEPATIC FUNCTION PANEL
ALK PHOS: 92 IU/L (ref 39–117)
ALT: 6 IU/L (ref 0–32)
AST: 18 IU/L (ref 0–40)
Albumin: 4.2 g/dL (ref 3.5–4.7)
Bilirubin Total: 0.6 mg/dL (ref 0.0–1.2)
Bilirubin, Direct: 0.15 mg/dL (ref 0.00–0.40)
TOTAL PROTEIN: 7.2 g/dL (ref 6.0–8.5)

## 2016-05-15 LAB — CBC WITH DIFFERENTIAL/PLATELET
BASOS ABS: 0 10*3/uL (ref 0.0–0.2)
Basos: 0 %
EOS (ABSOLUTE): 0.2 10*3/uL (ref 0.0–0.4)
Eos: 3 %
Hematocrit: 45.7 % (ref 34.0–46.6)
Hemoglobin: 15 g/dL (ref 11.1–15.9)
Immature Grans (Abs): 0 10*3/uL (ref 0.0–0.1)
Immature Granulocytes: 0 %
LYMPHS ABS: 1.2 10*3/uL (ref 0.7–3.1)
Lymphs: 25 %
MCH: 30.5 pg (ref 26.6–33.0)
MCHC: 32.8 g/dL (ref 31.5–35.7)
MCV: 93 fL (ref 79–97)
MONOS ABS: 0.4 10*3/uL (ref 0.1–0.9)
Monocytes: 9 %
NEUTROS ABS: 3.1 10*3/uL (ref 1.4–7.0)
Neutrophils: 63 %
PLATELETS: 219 10*3/uL (ref 150–379)
RBC: 4.91 x10E6/uL (ref 3.77–5.28)
RDW: 14.1 % (ref 12.3–15.4)
WBC: 5 10*3/uL (ref 3.4–10.8)

## 2016-05-15 LAB — BMP8+EGFR
BUN / CREAT RATIO: 16 (ref 12–28)
BUN: 20 mg/dL (ref 8–27)
CHLORIDE: 101 mmol/L (ref 96–106)
CO2: 22 mmol/L (ref 18–29)
Calcium: 9.4 mg/dL (ref 8.7–10.3)
Creatinine, Ser: 1.22 mg/dL — ABNORMAL HIGH (ref 0.57–1.00)
GFR calc non Af Amer: 42 mL/min/{1.73_m2} — ABNORMAL LOW (ref >59)
GFR, EST AFRICAN AMERICAN: 48 mL/min/{1.73_m2} — AB (ref >59)
GLUCOSE: 91 mg/dL (ref 65–99)
Potassium: 4.3 mmol/L (ref 3.5–5.2)
SODIUM: 142 mmol/L (ref 134–144)

## 2016-05-15 LAB — LIPID PANEL
Chol/HDL Ratio: 5.2 ratio units — ABNORMAL HIGH (ref 0.0–4.4)
Cholesterol, Total: 244 mg/dL — ABNORMAL HIGH (ref 100–199)
HDL: 47 mg/dL (ref >39)
LDL Calculated: 172 mg/dL — ABNORMAL HIGH (ref 0–99)
TRIGLYCERIDES: 125 mg/dL (ref 0–149)
VLDL Cholesterol Cal: 25 mg/dL (ref 5–40)

## 2016-05-15 LAB — VITAMIN D 25 HYDROXY (VIT D DEFICIENCY, FRACTURES): Vit D, 25-Hydroxy: 53.7 ng/mL (ref 30.0–100.0)

## 2016-05-16 ENCOUNTER — Telehealth: Payer: Self-pay | Admitting: *Deleted

## 2016-05-16 ENCOUNTER — Other Ambulatory Visit: Payer: Medicare Other

## 2016-05-16 ENCOUNTER — Telehealth: Payer: Self-pay | Admitting: Family Medicine

## 2016-05-16 DIAGNOSIS — Z1212 Encounter for screening for malignant neoplasm of rectum: Secondary | ICD-10-CM

## 2016-05-16 MED ORDER — LORAZEPAM 0.5 MG PO TABS: 0.5000 mg | ORAL_TABLET | Freq: Two times a day (BID) | ORAL | Status: DC

## 2016-05-16 MED ORDER — LEVOTHYROXINE SODIUM 50 MCG PO TABS: 50.0000 ug | ORAL_TABLET | Freq: Every day | ORAL | Status: DC

## 2016-05-16 NOTE — Telephone Encounter (Signed)
Pt aware of labs & recommendations

## 2016-05-16 NOTE — Telephone Encounter (Signed)
Patient only received # 30 lorazepam and she takes BID. Patient states that she was supposed to get #60 can I reorder her rx with #60 instead of #30. Patient brought me back the original prescription.

## 2016-05-16 NOTE — Telephone Encounter (Signed)
Give her 60 lorazepam with refills of possible

## 2016-05-16 NOTE — Telephone Encounter (Signed)
Patient aware that rx is ready to be picked up.  

## 2016-05-18 LAB — FECAL OCCULT BLOOD, IMMUNOCHEMICAL: Fecal Occult Bld: NEGATIVE

## 2016-06-16 ENCOUNTER — Telehealth: Payer: Self-pay | Admitting: Cardiology

## 2016-06-16 NOTE — Telephone Encounter (Signed)
I would like her to reduce her metoprolol to 25 mg bid.

## 2016-06-16 NOTE — Telephone Encounter (Signed)
Called patient back. Patient stated she has been feeling dizzy, lightheaded, shaky, and weak this week. Patient stated that she thinks she might be on too much medication. (Metoprolol, lisinopril, hydrochlorothiazide, amlodipine, benadryl, ativan)  Patient had to episodes of extreme weakness with the other symptoms in the middle of the afternoon. Patient took her BP this morning before taking medications and it was BP 120/70 HR 53. Patient took all her medications except for her lisinopril, which she takes in the evening, with her second dose of Metoprolol. Encouraged patient to take her BP at the times she is having these episodes. Informed patient to keep hydrated, and to have a healthy snack in the middle of the afternoon, because her blood sugar might be dropping at that time. Will forward to Dr. Percival Spanish for further advisement.

## 2016-06-16 NOTE — Telephone Encounter (Signed)
New Message:   Pt's blood pressure been dropping this week,she thinks her medicine might need to be adjusted.

## 2016-06-19 MED ORDER — METOPROLOL TARTRATE 25 MG PO TABS: 25.0000 mg | ORAL_TABLET | Freq: Two times a day (BID) | ORAL | 6 refills | Status: DC

## 2016-06-19 NOTE — Telephone Encounter (Signed)
Spoke with pt telling her to decrease Metoprolol 25 mg twice a day, and to call us back if her symptom does not improve. Metoprolol 25 mg #60 6 refills was send into pt pharmacy.

## 2016-07-12 DIAGNOSIS — H353131 Nonexudative age-related macular degeneration, bilateral, early dry stage: Secondary | ICD-10-CM | POA: Diagnosis not present

## 2016-07-12 DIAGNOSIS — H04123 Dry eye syndrome of bilateral lacrimal glands: Secondary | ICD-10-CM | POA: Diagnosis not present

## 2016-07-12 DIAGNOSIS — H10413 Chronic giant papillary conjunctivitis, bilateral: Secondary | ICD-10-CM | POA: Diagnosis not present

## 2016-07-12 DIAGNOSIS — Z961 Presence of intraocular lens: Secondary | ICD-10-CM | POA: Diagnosis not present

## 2016-07-18 ENCOUNTER — Telehealth: Payer: Self-pay | Admitting: Cardiology

## 2016-07-18 NOTE — Telephone Encounter (Signed)
Patient on multiple BP medications.  Have her discontinue the hydrochlorothiazide 25 mg for now.  Continue to monitor home BP.  We also need to know, what is "too low" BP for her.    Would encourage her to stay on the metoprolol 25 mg dose.  She had episode of AF in December and needs to understand that metoprolol helps prevent further episodes.

## 2016-07-18 NOTE — Telephone Encounter (Signed)
BP readings low as noted  pt denies dizziness, lightheadedness, syncope. notes BP is low for her. She was recently cut on her metoprolol dose from 50mg  BID ->25mg  BID  Still having symptoms - diarrhea, headaches, etc 'feels washed out' 'like i've got dishwater in my veins' notes that these improved w dose reduction but still present, so she knows she must be allergic to the metoprolol. states allergies to multiple meds.  She is asking if she can be put on different medication. Aware I will route to provider for review.

## 2016-07-18 NOTE — Telephone Encounter (Signed)
New message        Pt c/o BP issue: STAT if pt c/o blurred vision, one-sided weakness or slurred speech  1. What are your last 5 BP readings? 108/70 10 minutes ago, 100/73 this morning upon waking up  2. Are you having any other symptoms (ex. Dizziness, headache, blurred vision, passed out)? No  3. What is your BP issue? Weakness and no energy

## 2016-07-19 NOTE — Telephone Encounter (Signed)
agree

## 2016-07-20 NOTE — Telephone Encounter (Addendum)
Returned patient call-made aware of [facility] and MD Hochrein recommendations:      July 19, 2016  Minus Breeding, MD  2:32 PM  Note    agree       July 18, 2016  Rockne Menghini, RPH-CPP   4:11 PM  Note    Patient on multiple BP medications.  Have her discontinue the hydrochlorothiazide 25 mg for now.  Continue to monitor home BP.  We also need to know, what is "too low" BP for her.    Would encourage her to stay on the metoprolol 25 mg dose.  She had episode of AF in December and needs to understand that metoprolol helps prevent further episodes.        Pt reports she feels a little better since the change in the metoprolol was made but not completely better. Advised to continue to monitor BP and to call back if she continues to feel fatigued.  Pt verbalized understanding.

## 2016-07-20 NOTE — Telephone Encounter (Signed)
Pt says she have been waiting for 2 days to thear something about her medicine.

## 2016-07-24 IMAGING — CT CT PELVIS W/O CM
2 of 4 series · 11 of 46 positions shown, 12 images · non-contrast
Comparison: Right hip radiographs performed earlier today at [DATE]
p.m.

CLINICAL DATA: Fell on gravel, with severe right hip and buttock
pain. Initial encounter.

EXAM:
CT PELVIS WITHOUT CONTRAST
TECHNIQUE: Multidetector CT imaging of the pelvis was performed following the
standard protocol without intravenous contrast.

[Series 5: pelvis axial st 2.0 · axial · 0.50mm/px · z∈[+306,+538]mm · 8 of 134 slices shown, 9 images]
[im 9/134  soft-tissue]
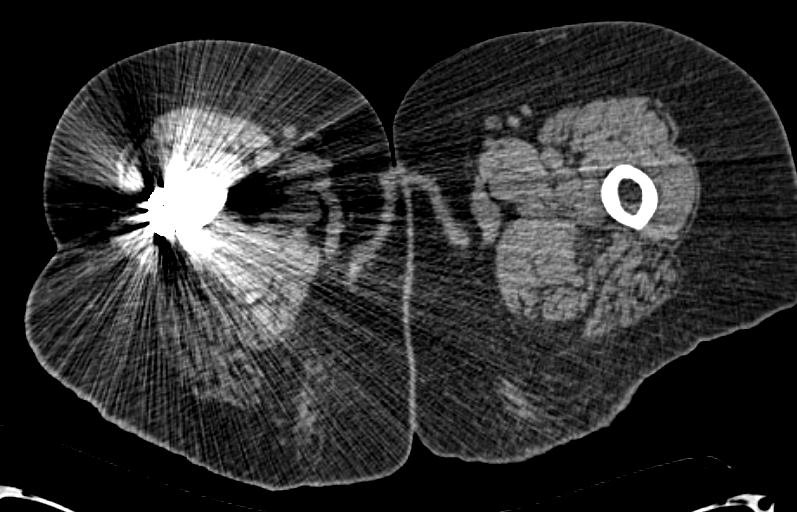
[im 9/134  bone]
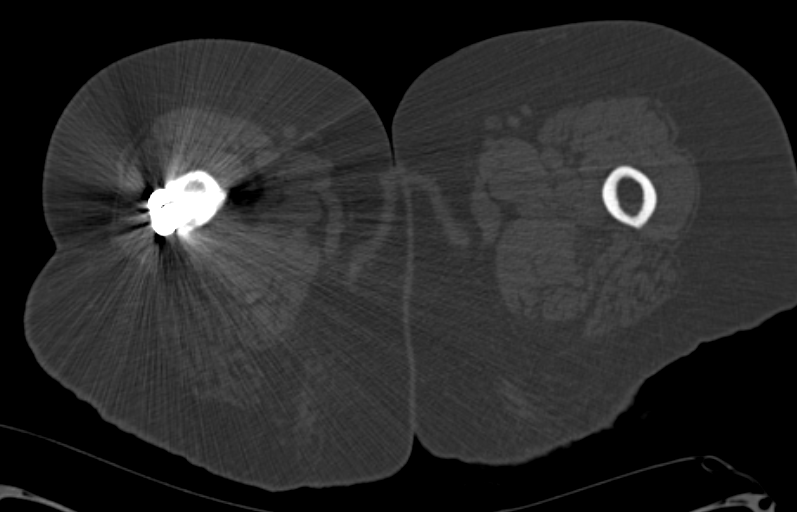
[im 26/134  soft-tissue]
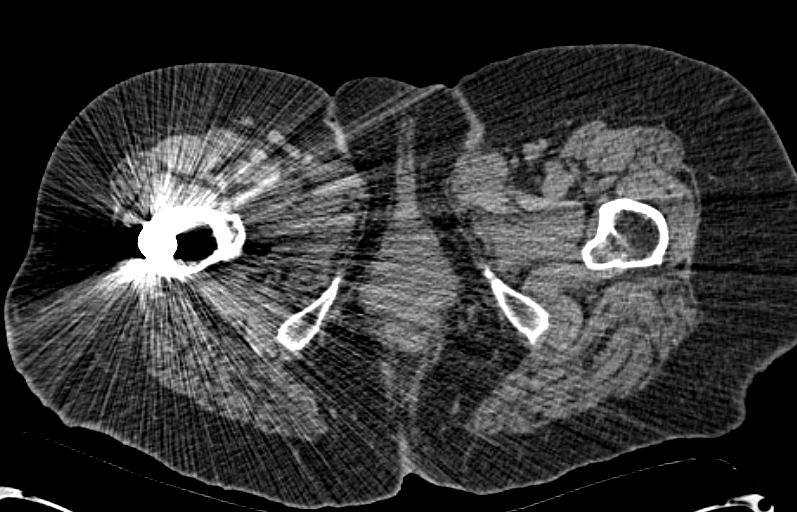
[im 43/134  soft-tissue]
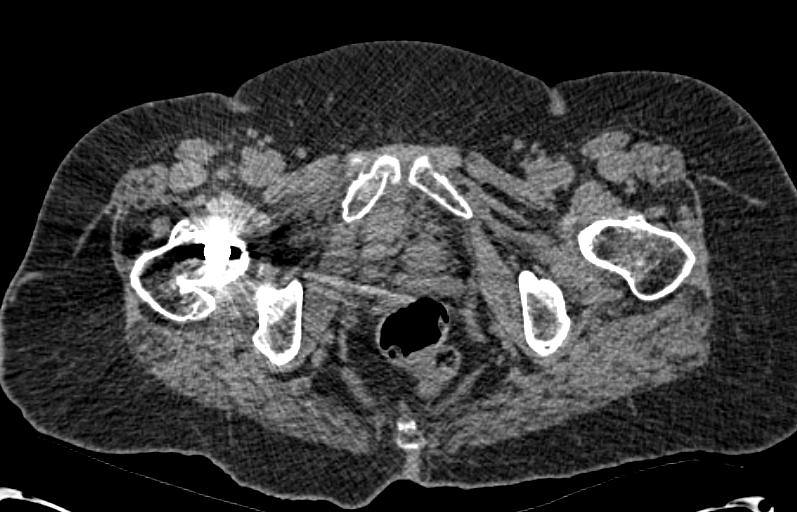
[im 61/134  soft-tissue]
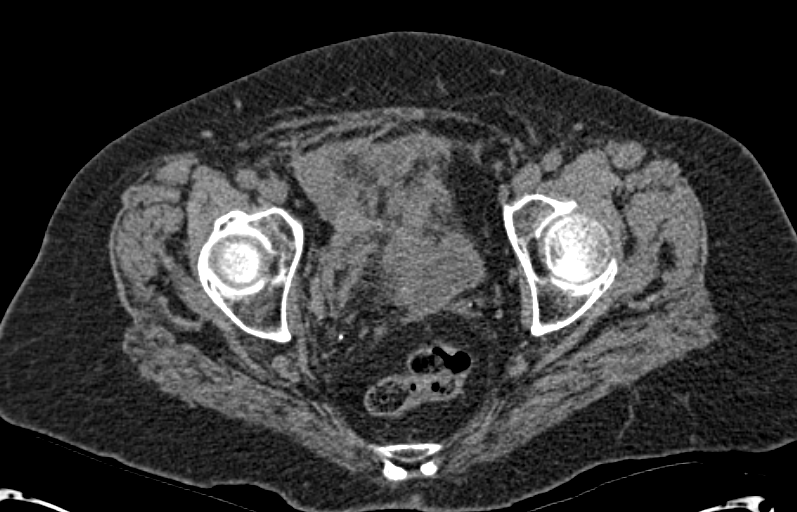
[im 73/134  soft-tissue]
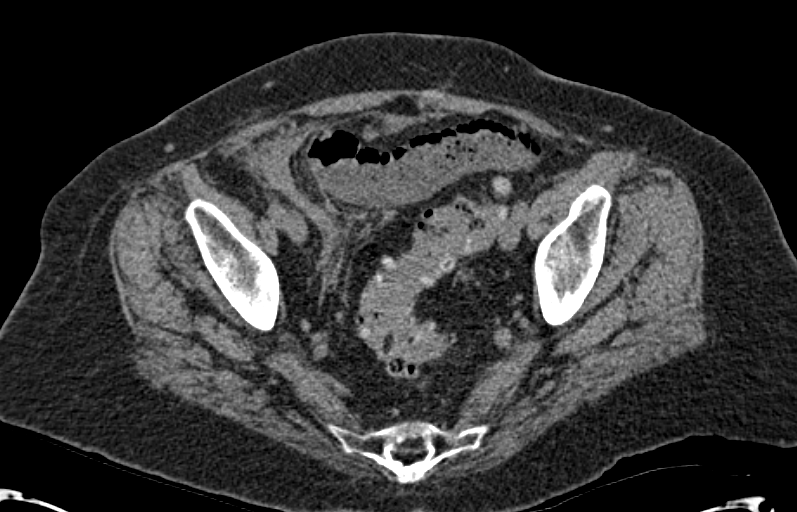
[im 91/134  soft-tissue]
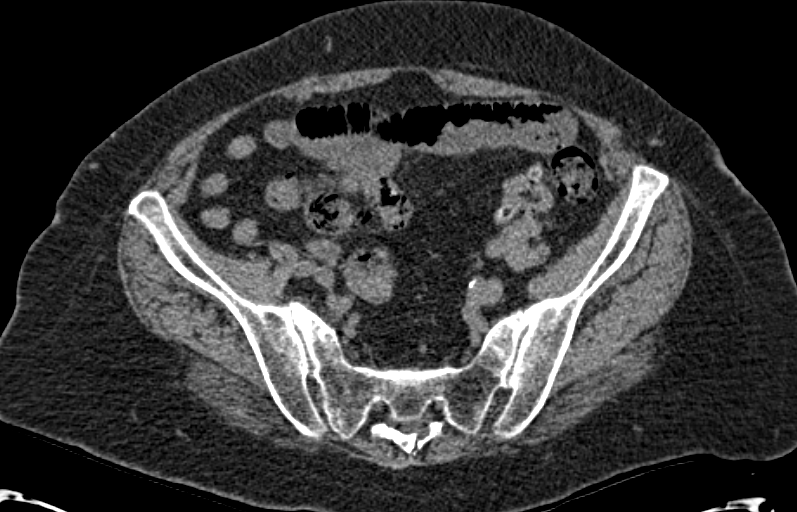
[im 108/134  soft-tissue]
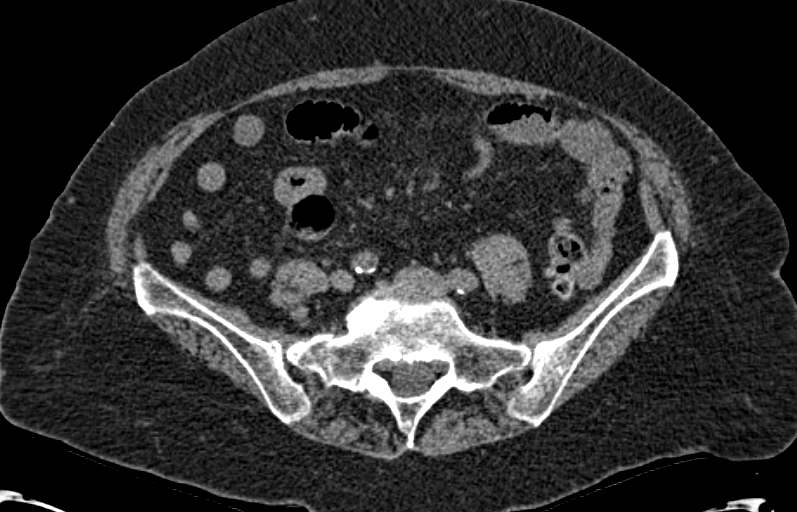
[im 125/134  soft-tissue]
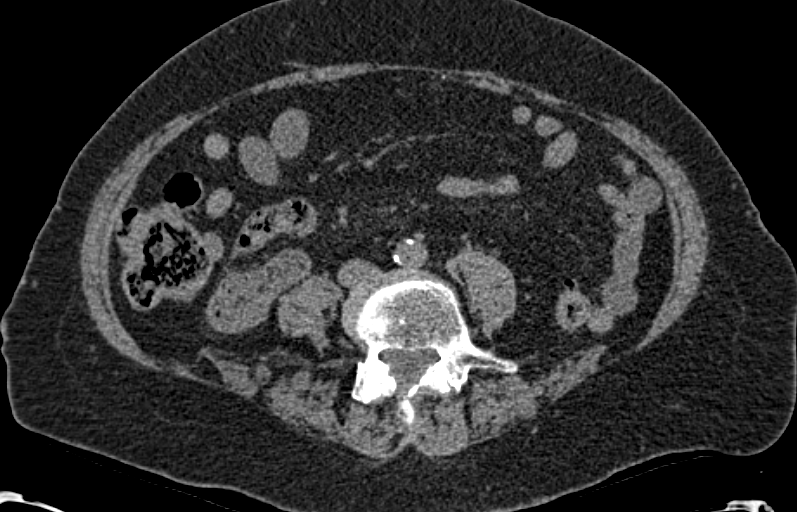

[Series 6: pelvis coronal st 2.0 · coronal · 0.70mm/px · 3 of 156 slices shown]
[im 39/156  soft-tissue]
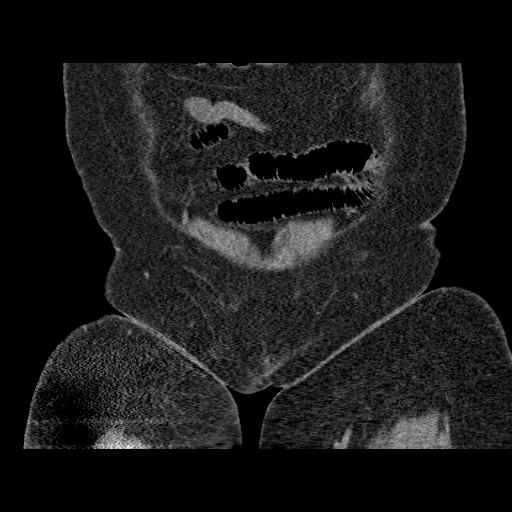
[im 78/156  soft-tissue]
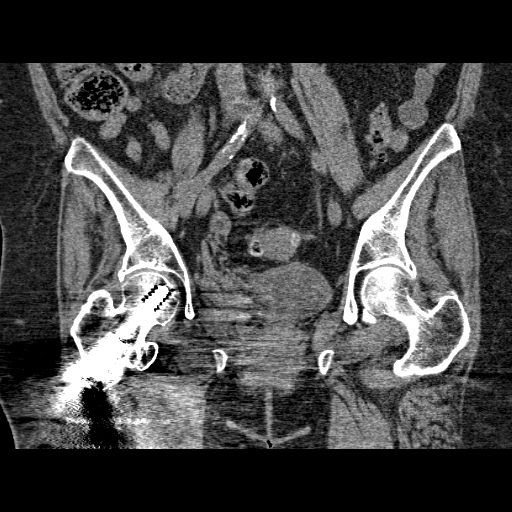
[im 117/156  soft-tissue]
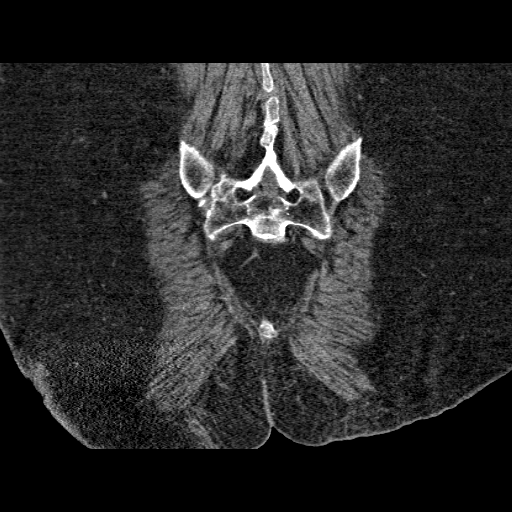

[11 of 46 positions shown; findings below may reference images not displayed]

FINDINGS: There is a mildly comminuted fracture extending across the right
sacral ala, likely extending into the right sacroiliac joint. This
does not appear to involve the underlying bony foramina. No
sacroiliac joint space widening is seen.

There is also a mildly displaced slightly comminuted fracture
involving the medial right superior pubic ramus and medial right
inferior pubic ramus, with likely extension to the pubic symphysis.
No additional fractures are seen.

The patient's right-sided dynamic hip screw is unremarkable in
appearance, without evidence of loosening. The femoral heads are
seated normally within the respective acetabula. There is no
evidence of diastasis at the symphysis.

Blood is noted tracking along the right pelvic sidewall, with a
x 3.6 x 5.4 cm hematoma tracking superior to the right pubic rami
fractures, extending about the right side of the space of Retzius
into the right lower quadrant.

Scattered diverticulosis is noted along the sigmoid colon, without
evidence of diverticulitis. Visualized small bowel loops are grossly
unremarkable. Scattered vascular calcifications are seen.
IMPRESSION: 1. Mildly displaced slightly comminuted fracture involving the
medial right superior and inferior pubic rami, with likely extension
to the pubic symphysis.
2. Mildly comminuted fracture extending across the right sacral ala,
likely extending into the right sacroiliac joint. This does not
appear to involve the underlying bony foramina. No sacroiliac joint
space widening seen.
3. Blood noted tracking along the right pelvic sidewall, with a
x 3.6 x 5.4 cm hematoma tracking superior to the right pubic rami
fractures, extending about the right side of the space of Retzius
into the right lower quadrant.
4. Right-sided dynamic hip screw is unremarkable in appearance,
without evidence of loosening.
5. Scattered diverticulosis along the sigmoid colon, without
evidence of diverticulitis.
6. Scattered vascular calcifications seen.

These results were called by telephone at the time of interpretation
on 05/30/2015 at [DATE] to Dr. Felix Raul Noba, who verbally
acknowledged these results.

## 2016-07-25 ENCOUNTER — Telehealth: Payer: Self-pay | Admitting: Cardiology

## 2016-07-25 NOTE — Telephone Encounter (Signed)
Please have the patient send [name] some BP readings.

## 2016-07-25 NOTE — Telephone Encounter (Signed)
New message      Pt calling about the being removed from medication to see if bp would improve and no difference. Pt states that taking her off did not improve. She is still fatigued. Please advise.      Pt c/o BP issue: STAT if pt c/o blurred vision, one-sided weakness or slurred speech  1. What are your last 5 BP readings? 8/26 108/70/63,  8/27 104/67/65, yesterday morning 104/71/70, last night 102/67/63, this morning 105/73/59  2. Are you having any other symptoms (ex. Dizziness, headache, blurred vision, passed out)? no  3. What is your BP issue? fatigued

## 2016-07-25 NOTE — Telephone Encounter (Signed)
She needs to give herself 5-7 days after stopping the hctz.  Not sure why she has been feeling more fatigued lately.  Has she checked her BP at all since Friday?  Would not like to cut back on metoprolol until we see her BP readings.  She is scheduled to see Dr. Percival Wagner in October, she may need to see a PA to see what else might be going on.

## 2016-07-25 NOTE — Telephone Encounter (Signed)
Spoke to patient. She states she is calling back because she states she into feeling any better. Still fatigue,no energy. 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 Discontinue the HCTZ on Friday 07/21/16.  RN informed patient it may take some more time to resolve.Patient states she is only taking Metoprolol 25 mg twice a day.no other changes with medications. Aware will defer to to  Dr Percival Spanish and pharmacists and contact her back

## 2016-07-25 NOTE — Telephone Encounter (Signed)
Spoke with Emily Wagner letting her know that Dr Percival Spanish want her to keep a daily check of her BP, also told pt if she continue to feel worst give our office a call back and we will make an appt for her to see one of our PA, pt voice understanding and will call us back with a Blood Pressure reading at the end of the week.

## 2016-08-02 DIAGNOSIS — I781 Nevus, non-neoplastic: Secondary | ICD-10-CM | POA: Diagnosis not present

## 2016-08-02 DIAGNOSIS — C44319 Basal cell carcinoma of skin of other parts of face: Secondary | ICD-10-CM | POA: Diagnosis not present

## 2016-08-02 DIAGNOSIS — X32XXXA Exposure to sunlight, initial encounter: Secondary | ICD-10-CM | POA: Diagnosis not present

## 2016-08-02 DIAGNOSIS — L821 Other seborrheic keratosis: Secondary | ICD-10-CM | POA: Diagnosis not present

## 2016-08-02 DIAGNOSIS — L570 Actinic keratosis: Secondary | ICD-10-CM | POA: Diagnosis not present

## 2016-08-11 ENCOUNTER — Telehealth: Payer: Self-pay | Admitting: Cardiology

## 2016-08-11 NOTE — Telephone Encounter (Signed)
Spoke with patient and advised to resend blood pressure readings. Stated would have to be next week when her daughter could fax from work Patient lowest reading beside this am was 103/68 and highest 126/78 but mostly in the 100's. Heart rate in the 60's.  She continues to feel very fatigued and weak.  Did have to take her HCTZ a couple of times for swelling and shortness of breath.  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

## 2016-08-11 NOTE — Telephone Encounter (Signed)
New Message  Pt states she faxed over bp readings on 9/6 and wanted to f/u to see if information was received. Please call back to discuss   Pt states he took her bp reading this morning 9/15..., [phone]

## 2016-08-11 NOTE — Telephone Encounter (Signed)
Left message to call back  Have not received blood pressure readings that I can tell, please resend

## 2016-08-13 NOTE — Telephone Encounter (Signed)
Have her reduce her metoprolol to 25 mg bid.  She should see her PCP if she has not recently for a routine check up as fatigue can be many things.  She can come in to see Lurena Joiner or Suanne Marker when they have an opening.

## 2016-08-14 NOTE — Telephone Encounter (Signed)
Emily Wagner is calling to see if the blood pressure readings has been faxed to the office and if we received them . Please call

## 2016-08-14 NOTE — Telephone Encounter (Signed)
Spoke with pt letting her know as per Dr Percival Spanish she should stop Metoprolol, seeing that it was reduce to 25 mg BID and pt wasn't feeling any better, pt asked to stop Metoprolol and to keep and regular blood pressure check.

## 2016-08-18 ENCOUNTER — Telehealth: Payer: Self-pay | Admitting: Cardiology

## 2016-08-18 NOTE — Telephone Encounter (Signed)
Her symptoms are noted.  I cannot sort this out over the phone.  She sent me a long letter which I read.  She needs to keep her Oct appt.

## 2016-08-18 NOTE — Telephone Encounter (Signed)
Spoke with pt, her metoprolol was stopped to help her bp. Her bp is doing better, 111/71 and 126/83. Her heart rate is elevated 100 to 80. She did take 12.5 mg of metoprolol last night and this morning due to heart rate. She also has restarted the HCTZ, she reports she developed edema in her feet after stopping this medication. She restarted the HCTZ about one week ago and the edema is gone. She is wanting the okay to restart the metoprolol at 12.5 mg twice daily or if there is a lower dose to take or different medication. She does report chest pain with elevated heart rate but reports after taking the metoprolol the chest pain is gone. Will forward to dr hochrein for review and advise

## 2016-08-18 NOTE — Telephone Encounter (Signed)
Spoke with pt who is reporting since stopping for Metoprolol last week (d/t fatigue) her HR has slowly increased to 100 bpm.  She felt like she had a knot in the middle for her chest.  She decided to restart the Metoprolol at 25 mg 1/2 tablet twice a day.  This morning her HR is 73 and she feels better.  She will continue to take Metoprolol 25 mg 1/2 tablet twice a day and monitor her HR and BP.  She also restarted her HCTZ d/t lower extremity edema.  She will c/b if further concerns and f/u with Dr Percival Spanish in October as scheduled.  Of note - she did see her PCP for further evaluation of fatigue and reports "he did not find anything."

## 2016-08-18 NOTE — Telephone Encounter (Signed)
Pt aware and will keep appt as scheduled 

## 2016-08-18 NOTE — Telephone Encounter (Signed)
New Message  Pt c/o medication issue:  1. Name of Medication: Metoprolol  2. How are you currently taking this medication (dosage and times per day)? 25mg    3. Are you having a reaction (difficulty breathing--STAT)? Per pt states heart pounding, pt states heart beating 100 beats a minute. Chest pain   4. What is your medication issue? Per pt requested to speak with RN. Pt stats Dr. Percival Spanish changes dosage of med and pt states it is to much for her. Pt states she having reactions to the med and went back to taking half a pill and feels better. Pt would like to speak to RN to know if its okay for her to go back to taking half a pill. Please call back to discuss

## 2016-08-21 ENCOUNTER — Telehealth: Payer: Self-pay | Admitting: Family Medicine

## 2016-08-22 ENCOUNTER — Ambulatory Visit (INDEPENDENT_AMBULATORY_CARE_PROVIDER_SITE_OTHER): Payer: Medicare Other | Admitting: Family Medicine

## 2016-08-22 ENCOUNTER — Encounter: Payer: Self-pay | Admitting: Family Medicine

## 2016-08-22 VITALS — BP 129/83 | HR 72 | Temp 97.0°F | Ht 64.0 in | Wt 165.0 lb

## 2016-08-22 DIAGNOSIS — K219 Gastro-esophageal reflux disease without esophagitis: Secondary | ICD-10-CM | POA: Diagnosis not present

## 2016-08-22 DIAGNOSIS — I2510 Atherosclerotic heart disease of native coronary artery without angina pectoris: Secondary | ICD-10-CM | POA: Diagnosis not present

## 2016-08-22 DIAGNOSIS — I10 Essential (primary) hypertension: Secondary | ICD-10-CM | POA: Diagnosis not present

## 2016-08-22 NOTE — Progress Notes (Signed)
Subjective:    Patient ID: Emily Wagner, female    DOB: Feb 25, 1933, 82 y.o.   MRN: [id number]  HPI Patient here today for GERD. She is taking medications regularly and she has tried a number of OTC medications for this issue. He is pleasant and alert. She has had increased heartburn for at least a month and especially bad for the past 3 days and especially bad at nighttime with laying down after dinner. She is tried various things over the counter without relief. She denies any blood in the stool or black tarry bowel movements cyst the heartburn that she is having. She has talked to the cardiologist and he does not feel like this is cardiac related. She denies any chest pain or shortness of breath. Just reflux. Her water without problems. He brings in blood pressures for review and the majority of these are good with some systolics being as low as 84 but the majority of the readings have a systolic in the [id number] level up to the 120s.    Patient Active Problem List   Diagnosis Date Noted  . Long term (current) use of anticoagulants 11/11/2015  . Atrial fibrillation ([place]) 11/04/2015  . 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 Infection of urinary tract 10/31/2015  . Unstable angina ([place]) 10/29/2015  . CAD (coronary artery disease) 10/29/2015  . Benign hypertensive heart disease without heart failure   . Interstitial cystitis   . Osteopenia   . Pelvic fracture ([place]) 05/30/2015  . Allergy to multiple medications 07/15/2013  . Renal insufficiency   . Adjustment disorder with mixed anxiety and depressed mood   . Osteoporosis   . ASCVD (arteriosclerotic cardiovascular disease)   . 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  . HYPERCHOLESTEROLEMIA 05/02/2008  . Essential hypertension 05/02/2008  . INTERSTITIAL CYSTITIS 05/02/2008  . DIVERTICULOSIS OF COLON 08/27/2003   Outpatient Encounter Prescriptions as of 08/22/2016  Medication Sig  . amLODipine (NORVASC) 5 MG tablet Take 1 tablet (5 mg total) by  mouth daily.  Marland Kitchen aspirin EC 81 MG tablet Take 81 mg by mouth daily.  . Cholecalciferol (VITAMIN D) 2000 UNITS CAPS Take 2,000 Units by mouth daily.   . diphenhydrAMINE (BENADRYL) 25 MG tablet Take 12.5 mg by mouth daily.   . fexofenadine (ALLEGRA) 180 MG tablet Take 180 mg by mouth daily.  . hydrochlorothiazide (HYDRODIURIL) 25 MG tablet Take 1 tablet (25 mg total) by mouth daily.  Marland Kitchen levothyroxine (SYNTHROID, LEVOTHROID) 50 MCG tablet Take 1 tablet (50 mcg total) by mouth daily before breakfast.  . lisinopril (PRINIVIL,ZESTRIL) 10 MG tablet Take 1 tablet (10 mg total) by mouth daily.  Marland Kitchen LORazepam (ATIVAN) 0.5 MG tablet Take 1 tablet (0.5 mg total) by mouth 2 (two) times daily.  . metoprolol (LOPRESSOR) 25 MG tablet Take 1 tablet (25 mg total) by mouth 2 (two) times daily.  . Multiple Vitamins-Minerals (PRESERVISION AREDS PO) Take 1 tablet by mouth daily.  . ciprofloxacin (CIPRO) 500 MG tablet Take 1 tablet (500 mg total) by mouth daily with breakfast. (Patient not taking: Reported on 08/22/2016)   No facility-administered encounter medications on file as of 08/22/2016.       Review of Systems  Constitutional: Negative.   HENT: Negative.   Eyes: Negative.   Respiratory: Negative.   Cardiovascular: Negative.   Gastrointestinal: Negative.        Heartburn  Endocrine: Negative.   Genitourinary: Negative.   Musculoskeletal: Negative.   Skin: Negative.   Allergic/Immunologic: Negative.   Neurological:  Negative.   Hematological: Negative.   Psychiatric/Behavioral: Negative.        Objective:   Physical Exam  Constitutional: She is oriented to person, place, and time. She appears well-developed and well-nourished. No distress.  HENT:  Head: Normocephalic and atraumatic.  Right Ear: External ear normal.  Left Ear: External ear normal.  Nose: Nose normal.  Mouth/Throat: Oropharynx is clear and moist.  Eyes: Conjunctivae and EOM are normal. Pupils are equal, round, and reactive to  light. Right eye exhibits no discharge. Left eye exhibits no discharge. No scleral icterus.  Neck: Normal range of motion. Neck supple. No thyromegaly present.  No bruits thyromegaly or anterior cervical adenopathy  Cardiovascular: Normal rate, regular rhythm and normal heart sounds.   No murmur heard. The heart is regular at 72/m  Pulmonary/Chest: Effort normal and breath sounds normal. No respiratory distress. She has no wheezes. She has no rales.  Clear anteriorly and posteriorly  Abdominal: Soft. Bowel sounds are normal. She exhibits no mass. There is no tenderness. There is no rebound and no guarding.  No liver or spleen enlargement and no epigastric tenderness  Musculoskeletal: Normal range of motion. She exhibits no edema.  Lymphadenopathy:    She has no cervical adenopathy.  Neurological: She is alert and oriented to person, place, and time.  Skin: Skin is warm and dry. No rash noted.  Psychiatric: She has a normal mood and affect. Her behavior is normal. Judgment and thought content normal.  Nursing note and vitals reviewed.  BP 129/83 (BP Location: Left Arm)   Pulse 72   Temp 97 F (36.1 C) (Oral)   Ht 5\' 4"  (1.626 m)   Wt 165 lb (74.8 kg)   LMP 03/21/1971   BMI 28.32 kg/m         Assessment & Plan:  1. Essential hypertension -Blood pressure readings brought in for review by the majority are good with only a few being low and this is what taking metoprolol 12.5 twice daily. -These blood pressures will be scanned into the record  2. ASCVD (arteriosclerotic cardiovascular disease) -Continue to follow-up with cardiology  3. Gastroesophageal reflux disease, esophagitis presence not specified - CBC with Differential/Platelet - Hepatic function panel -FOBT given to patient to return -Take ranitidine, the equate brand 150 mg one twice daily before breakfast and supper -Watch diet avoiding fried foods caffeine and highly spiced foods  Patient Instructions  Purchase  ranitidine, the equate brand at [facility] over-the-counter 150 mg and take one twice daily before breakfast and supper Avoid caffeine and avoid fried foods and make sure that you take your aspirin after eating Return the FOBT and we will call you with the blood work results as soon as those results become available  Arrie Senate MD

## 2016-08-22 NOTE — Patient Instructions (Addendum)
Purchase ranitidine, the equate brand at [facility] over-the-counter 150 mg and take one twice daily before breakfast and supper Avoid caffeine and avoid fried foods and make sure that you take your aspirin after eating Return the FOBT and we will call you with the blood work results as soon as those results become available

## 2016-08-23 ENCOUNTER — Telehealth: Payer: Self-pay | Admitting: Cardiology

## 2016-08-23 LAB — HEPATIC FUNCTION PANEL
ALBUMIN: 4.2 g/dL (ref 3.5–4.7)
ALK PHOS: 85 IU/L (ref 39–117)
ALT: 8 IU/L (ref 0–32)
AST: 17 IU/L (ref 0–40)
Bilirubin Total: 0.6 mg/dL (ref 0.0–1.2)
Bilirubin, Direct: 0.13 mg/dL (ref 0.00–0.40)
TOTAL PROTEIN: 7 g/dL (ref 6.0–8.5)

## 2016-08-23 LAB — CBC WITH DIFFERENTIAL/PLATELET
BASOS ABS: 0 10*3/uL (ref 0.0–0.2)
Basos: 1 %
EOS (ABSOLUTE): 0.2 10*3/uL (ref 0.0–0.4)
Eos: 5 %
HEMOGLOBIN: 14.5 g/dL (ref 11.1–15.9)
Hematocrit: 43 % (ref 34.0–46.6)
IMMATURE GRANS (ABS): 0 10*3/uL (ref 0.0–0.1)
IMMATURE GRANULOCYTES: 0 %
LYMPHS ABS: 1.4 10*3/uL (ref 0.7–3.1)
LYMPHS: 27 %
MCH: 30.9 pg (ref 26.6–33.0)
MCHC: 33.7 g/dL (ref 31.5–35.7)
MCV: 92 fL (ref 79–97)
MONOCYTES: 10 %
Monocytes Absolute: 0.5 10*3/uL (ref 0.1–0.9)
NEUTROS PCT: 57 %
Neutrophils Absolute: 3 10*3/uL (ref 1.4–7.0)
Platelets: 228 10*3/uL (ref 150–379)
RBC: 4.7 x10E6/uL (ref 3.77–5.28)
RDW: 13.7 % (ref 12.3–15.4)
WBC: 5.2 10*3/uL (ref 3.4–10.8)

## 2016-08-23 NOTE — Telephone Encounter (Signed)
New message   FYI   BP readings:    Sun 9/25th 92/58/70, Mon   84/62/89, 9/27,  Tue 97/57/73

## 2016-08-23 NOTE — Telephone Encounter (Signed)
Returned call to patient, who is very distraught about the BPs as noted. She feels weak and is desperate to have something addressed prior to appt on 10/18 w Dr. Percival Spanish. She has had recent adjustments to metoprolol, currently taking 12.5mg  BID to address low BPs (I updated medication list). However, I reviewed her meds with her and she is also on lisinopril and amlodipine. Discussed w patient, think it would be reasonable to inquire if we can reduce one of these medications and see if she gets some improvement with this. Aware I will request advice and return her call.

## 2016-08-23 NOTE — Telephone Encounter (Signed)
Pt advised to hold the amlodipine & to monitor Bp for changes. Aware this will potentially take several days for BP to normalize. She expressed understanding and thanks for call. Will call if sooner concerns.

## 2016-08-23 NOTE — Telephone Encounter (Signed)
Her BP was good at MD appt yesterday.  If she is running < [id number] systolic at home, have her stop the amlodipine for now.  Be sure she brings her BP cuff into her appt with Dr. Percival Spanish on 10/18

## 2016-08-29 ENCOUNTER — Other Ambulatory Visit (INDEPENDENT_AMBULATORY_CARE_PROVIDER_SITE_OTHER): Payer: Medicare Other

## 2016-08-29 DIAGNOSIS — Z1211 Encounter for screening for malignant neoplasm of colon: Secondary | ICD-10-CM

## 2016-08-30 ENCOUNTER — Encounter: Payer: Self-pay | Admitting: Cardiology

## 2016-08-31 LAB — FECAL OCCULT BLOOD, IMMUNOCHEMICAL: FECAL OCCULT BLD: NEGATIVE

## 2016-08-31 NOTE — Telephone Encounter (Signed)
Patient seen on 09/26

## 2016-09-12 DIAGNOSIS — M7741 Metatarsalgia, right foot: Secondary | ICD-10-CM | POA: Diagnosis not present

## 2016-09-12 DIAGNOSIS — M79672 Pain in left foot: Secondary | ICD-10-CM | POA: Diagnosis not present

## 2016-09-12 DIAGNOSIS — M79671 Pain in right foot: Secondary | ICD-10-CM | POA: Diagnosis not present

## 2016-09-12 DIAGNOSIS — M7742 Metatarsalgia, left foot: Secondary | ICD-10-CM | POA: Diagnosis not present

## 2016-09-12 NOTE — Progress Notes (Signed)
Cardiology Office Note   Date:  09/13/2016   ID:  Tinashe Hoss, DOB [birth year], MRN [id number]  PCP:  Redge Gainer, MD  Cardiologist:   Minus Breeding, MD   Chief Complaint  Patient presents with  . Hypertension      History of Present Illness: Emily Wagner is a 82 y.o. female who presents for follow-up after bypass surgery. Since I last saw her she has problems with low blood pressure and there have ben may phone calls to our office.  At the last one she had Norvasc stopped.  She sent a long letter which I read.  She has reduced the dose of her beta blocker.  Now she finally feels better.  The patient denies any new symptoms such as chest discomfort, neck or arm discomfort. There has been no new shortness of breath, PND or orthopnea. There have been no reported palpitations, presyncope or syncope.  She is weak still.  She does all of her chores.     Past Medical History:  Diagnosis Date  . Adjustment disorder with mixed anxiety and depressed mood   . Atrial fibrillation ([place]) 11/04/2015   Post-operative  . Benign hypertensive heart disease without heart failure   . CAD (coronary artery disease)   . Infection of urinary tract 10/31/2015  . Interstitial cystitis   . Osteopenia   . Other and unspecified hyperlipidemia   . Other dyspnea and respiratory abnormality   . Pelvic fracture ([place]) 05/30/2015  . 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 ABDOMINAL HYSTERECTOMY     Cancer and endometriosis  . CARDIAC CATHETERIZATION  2008  . CARDIAC CATHETERIZATION N/A 10/29/2015   Procedure: Left Heart Cath and Coronary Angiography;  Surgeon: Belva Crome, MD;  Location: [facility];  Service: Cardiovascular;  Laterality: N/A;  . CATARACT EXTRACTION    . CHOLECYSTECTOMY    . CORONARY ARTERY BYPASS GRAFT N/A 11/02/2015   Procedure: OFF PUMP CORONARY ARTERY BYPASS GRAFTING (CABG) TIMES ONE;  Surgeon: Rexene Alberts, MD;  Location: [facility];  Service: Open Heart Surgery;  Laterality: N/A;  LIMA to LAD  . FRACTURE SURGERY     Right hip  . HERNIA REPAIR     Right inguinal  . TEE WITHOUT CARDIOVERSION N/A 11/02/2015   Procedure: TRANSESOPHAGEAL ECHOCARDIOGRAM (TEE);  Surgeon: Rexene Alberts, MD;  Location: [facility];  Service: Open Heart Surgery;  Laterality: N/A;     Current Outpatient Prescriptions  Medication Sig Dispense Refill  . aspirin EC 81 MG tablet Take 81 mg by mouth daily.    . Cholecalciferol (VITAMIN D) 2000 UNITS CAPS Take 2,000 Units by mouth daily.     . ciprofloxacin (CIPRO) 500 MG tablet Take 1 tablet (500 mg total) by mouth daily with breakfast. 90 tablet 0  . fexofenadine (ALLEGRA) 180 MG tablet Take 180 mg by mouth daily.    . hydrochlorothiazide (HYDRODIURIL) 25 MG tablet Take 1 tablet (25 mg total) by mouth daily. 90 tablet 3  . levothyroxine (SYNTHROID, LEVOTHROID) 50 MCG tablet Take 1 tablet (50 mcg total) by mouth daily before breakfast. 90 tablet 3  . lisinopril (PRINIVIL,ZESTRIL) 10 MG tablet Take 1 tablet (10 mg total) by mouth daily. 90 tablet 3  . LORazepam (ATIVAN) 0.5 MG tablet Take 1 tablet (0.5 mg total) by mouth 2 (two) times daily. 60 tablet 3  .  metoprolol tartrate (LOPRESSOR) 25 MG tablet Take 1/2 tablet by mouth twice a day    . Multiple Vitamins-Minerals (PRESERVISION AREDS PO) Take 1 tablet by mouth every other day.      No current facility-administered medications for this visit.     Allergies:   Nitrofurantoin; Nsaids; Other; Flonase [fluticasone propionate]; Prevnar 13 [pneumococcal 13-val conj vacc]; Alendronate sodium; Ciprofloxacin; Codeine; Crestor [rosuvastatin calcium]; Darvon; Lipitor [atorvastatin calcium]; Pravachol; Propranolol hcl; Sulfa antibiotics; Sulfamethoxazole; Ultram [tramadol hcl]; Welchol [colesevelam hcl]; Zetia [ezetimibe]; and Zocor [simvastatin]    ROS:  Please see the history of present illness.   Otherwise, review of systems are  positive for none.   All other systems are reviewed and negative.    PHYSICAL EXAM: VS:  BP 122/78   Pulse 60   Ht 5\' 4"  (1.626 m)   Wt 166 lb (75.3 kg)   LMP 03/21/1971   BMI 28.49 kg/m  , BMI Body mass index is 28.49 kg/m. GENERAL:  Well appearing NECK:  No jugular venous distention, waveform within normal limits, carotid upstroke brisk and symmetric, no bruits, no thyromegaly LUNGS:  Clear to auscultation bilaterally BACK:  No CVA tenderness CHEST: Well healed sternotomy scar. HEART:  PMI not displaced or sustained,S1 and S2 within normal limits, no S3, no S4, no clicks, no rubs, no murmurs ABD:  Flat, positive bowel sounds normal in frequency in pitch, no bruits, no rebound, no guarding, no midline pulsatile mass, no hepatomegaly, no splenomegaly EXT:  2 plus pulses throughout, trace edema, no cyanosis no clubbing   EKG:  EKG is ordered today. Sinus rhythm, rate 61, axis within normal limits, poor anterior R-wave progression, no acute ST-T wave changes.  PACs   Recent Labs: 11/06/2015: Magnesium 1.8 11/08/2015: Hemoglobin 13.0; TSH 2.845 05/13/2016: BUN 20; Creatinine, Ser 1.22; Potassium 4.3; Sodium 142 08/22/2016: ALT 8; Platelets 228    Lipid Panel    Component Value Date/Time   CHOL 244 (H) 05/13/2016 0937   TRIG 125 05/13/2016 0937   TRIG 162 (H) 06/24/2015 0918   HDL 47 05/13/2016 0937   HDL 44 06/24/2015 0918   CHOLHDL 5.2 (H) 05/13/2016 0937   CHOLHDL 5.4 10/31/2015 0215   VLDL 20 10/31/2015 0215   LDLCALC 172 (H) 05/13/2016 0937   LDLCALC 163 (H) 05/14/2014 1038      Wt Readings from Last 3 Encounters:  09/13/16 166 lb (75.3 kg)  08/22/16 165 lb (74.8 kg)  05/10/16 160 lb (72.6 kg)      Other studies Reviewed: Additional studies/ records that were reviewed today include: None   Review of the above records demonstrates:     ASSESSMENT AND PLAN:  CABG with LIMA-LAD:  She is doing OK.  No change in therapy is indicated.   HYPOTENSION:  Her  blood pressure now finally OK and she will stop taking the readings every day.   ATRIAL [id number].   Her fibrillation was postop. She has had no recurrence.  No change in therapy is indicated.     Current medicines are reviewed at length with the patient today.  The patient does not have concerns regarding medicines.  The following changes have been made: None  Labs/ tests ordered today include:   None  Orders Placed This Encounter  Procedures  . EKG 12-Lead     Disposition:   FU with me in 6 months.    Signed, Minus Breeding, MD  09/13/2016 10:30 AM    [place]

## 2016-09-13 ENCOUNTER — Encounter: Payer: Self-pay | Admitting: Cardiology

## 2016-09-13 ENCOUNTER — Ambulatory Visit (INDEPENDENT_AMBULATORY_CARE_PROVIDER_SITE_OTHER): Payer: Medicare Other | Admitting: Cardiology

## 2016-09-13 VITALS — BP 122/78 | HR 60 | Ht 64.0 in | Wt 166.0 lb

## 2016-09-13 DIAGNOSIS — I2510 Atherosclerotic heart disease of native coronary artery without angina pectoris: Secondary | ICD-10-CM | POA: Diagnosis not present

## 2016-09-13 DIAGNOSIS — I10 Essential (primary) hypertension: Secondary | ICD-10-CM | POA: Diagnosis not present

## 2016-09-13 MED ORDER — LISINOPRIL 10 MG PO TABS: 10.0000 mg | ORAL_TABLET | Freq: Every day | ORAL | 3 refills | Status: DC

## 2016-09-13 MED ORDER — HYDROCHLOROTHIAZIDE 25 MG PO TABS: 25.0000 mg | ORAL_TABLET | Freq: Every day | ORAL | 3 refills | Status: DC

## 2016-09-13 NOTE — Patient Instructions (Signed)
Medication Instructions:  The current medical regimen is effective;  continue present plan and medications.  Follow-Up: Follow up in 6 months with Dr. Hochrein.  You will receive a letter in the mail 2 months before you are due.  Please call us when you receive this letter to schedule your follow up appointment.  If you need a refill on your cardiac medications before your next appointment, please call your pharmacy.  Thank you for choosing [facility]!!       

## 2016-09-29 ENCOUNTER — Ambulatory Visit (INDEPENDENT_AMBULATORY_CARE_PROVIDER_SITE_OTHER): Payer: Medicare Other

## 2016-09-29 ENCOUNTER — Encounter: Payer: Self-pay | Admitting: Family Medicine

## 2016-09-29 ENCOUNTER — Ambulatory Visit (INDEPENDENT_AMBULATORY_CARE_PROVIDER_SITE_OTHER): Payer: Medicare Other | Admitting: Family Medicine

## 2016-09-29 VITALS — BP 127/84 | HR 64 | Temp 97.3°F | Ht 64.0 in | Wt 165.0 lb

## 2016-09-29 DIAGNOSIS — N390 Urinary tract infection, site not specified: Secondary | ICD-10-CM

## 2016-09-29 DIAGNOSIS — E559 Vitamin D deficiency, unspecified: Secondary | ICD-10-CM

## 2016-09-29 DIAGNOSIS — I2510 Atherosclerotic heart disease of native coronary artery without angina pectoris: Secondary | ICD-10-CM | POA: Diagnosis not present

## 2016-09-29 DIAGNOSIS — I10 Essential (primary) hypertension: Secondary | ICD-10-CM | POA: Diagnosis not present

## 2016-09-29 DIAGNOSIS — E7800 Pure hypercholesterolemia, unspecified: Secondary | ICD-10-CM | POA: Diagnosis not present

## 2016-09-29 DIAGNOSIS — K219 Gastro-esophageal reflux disease without esophagitis: Secondary | ICD-10-CM | POA: Diagnosis not present

## 2016-09-29 DIAGNOSIS — Z13820 Encounter for screening for osteoporosis: Secondary | ICD-10-CM | POA: Diagnosis not present

## 2016-09-29 DIAGNOSIS — Z780 Asymptomatic menopausal state: Secondary | ICD-10-CM | POA: Diagnosis not present

## 2016-09-29 DIAGNOSIS — I480 Paroxysmal atrial fibrillation: Secondary | ICD-10-CM | POA: Diagnosis not present

## 2016-09-29 MED ORDER — CIPROFLOXACIN HCL 500 MG PO TABS: 500.0000 mg | ORAL_TABLET | Freq: Every day | ORAL | 0 refills | Status: DC

## 2016-09-29 NOTE — Progress Notes (Signed)
Subjective:    Patient ID: Emily Wagner, female    DOB: Apr 08, 1933, 82 y.o.   MRN: [id number]  HPI  Pt here for follow up and management of chronic medical problems which includes hypertension and hyperlipidemia. She is taking medications regularly.The patient is doing well today with no specific complaints. She is requesting a refill on her Cipro that she has to take this periodically for urinary tract infections. She is due to get a DEXA scan and get lab work today. She has already had her flu shot. Her vital signs are stable and her weight is stable her BMI is 28.6. The patient just saw the cardiologist and got a good report from him. He reduce some of her medicine and she feels much better because her blood pressure been dropping too low. She is very thankful for this. She will see him again in 6 months. She denies any chest pain pressure or palpitations. She has occasional shortness of breath which is expected associated with whatever she is doing. She has no trouble with her stomach anymore since she is taking 1 Zantac daily before supper. This is helped her stomach feel better and having less reflux. She denies any blood in the stool trouble swallowing heartburn and nausea vomiting diarrhea or black tarry bowel movements. Currently she is passing her water well and not having any burning or symptoms. She was a refill on the Cipro because when she does have recurrent urinary tract infections she takes 500 mg daily for 1 week. I suggested that she try taking it twice a day for 3 days and she said she would do this.     Patient Active Problem List   Diagnosis Date Noted  . Long term (current) use of anticoagulants 11/11/2015  . Atrial fibrillation ([place]) 11/04/2015  . 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 Infection of urinary tract 10/31/2015  . Unstable angina ([place]) 10/29/2015  . CAD (coronary artery disease) 10/29/2015  . Benign hypertensive heart disease without heart failure   .  Interstitial cystitis   . Osteopenia   . Pelvic fracture ([place]) 05/30/2015  . Allergy to multiple medications 07/15/2013  . Renal insufficiency   . Adjustment disorder with mixed anxiety and depressed mood   . Osteoporosis   . ASCVD (arteriosclerotic cardiovascular disease)   . 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  . HYPERCHOLESTEROLEMIA 05/02/2008  . Essential hypertension 05/02/2008  . INTERSTITIAL CYSTITIS 05/02/2008  . DIVERTICULOSIS OF COLON 08/27/2003   Outpatient Encounter Prescriptions as of 09/29/2016  Medication Sig  . aspirin EC 81 MG tablet Take 81 mg by mouth daily.  . Cholecalciferol (VITAMIN D) 2000 UNITS CAPS Take 2,000 Units by mouth daily.   . hydrochlorothiazide (HYDRODIURIL) 25 MG tablet Take 1 tablet (25 mg total) by mouth daily.  Marland Kitchen levothyroxine (SYNTHROID, LEVOTHROID) 50 MCG tablet Take 1 tablet (50 mcg total) by mouth daily before breakfast.  . lisinopril (PRINIVIL,ZESTRIL) 10 MG tablet Take 1 tablet (10 mg total) by mouth daily.  Marland Kitchen LORazepam (ATIVAN) 0.5 MG tablet Take 1 tablet (0.5 mg total) by mouth 2 (two) times daily.  . metoprolol tartrate (LOPRESSOR) 25 MG tablet Take 1/2 tablet by mouth twice a day  . Multiple Vitamins-Minerals (PRESERVISION AREDS PO) Take 1 tablet by mouth every other day.   . ciprofloxacin (CIPRO) 500 MG tablet Take 1 tablet (500 mg total) by mouth daily with breakfast. (Patient not taking: Reported on 09/29/2016)  . [DISCONTINUED] fexofenadine (ALLEGRA) 180 MG tablet Take 180 mg  by mouth daily.   No facility-administered encounter medications on file as of 09/29/2016.      Review of Systems  Constitutional: Negative.   HENT: Negative.   Eyes: Negative.   Respiratory: Negative.   Cardiovascular: Negative.   Gastrointestinal: Negative.   Endocrine: Negative.   Genitourinary: Negative.   Musculoskeletal: Negative.   Skin: Negative.   Allergic/Immunologic: Negative.   Neurological: Negative.   Hematological: Negative.     Psychiatric/Behavioral: Negative.        Objective:   Physical Exam  Constitutional: She is oriented to person, place, and time. She appears well-developed and well-nourished. No distress.  The patient is pleasant and alert  HENT:  Head: Normocephalic and atraumatic.  Right Ear: External ear normal.  Left Ear: External ear normal.  Nose: Nose normal.  Mouth/Throat: Oropharynx is clear and moist.  Eyes: Conjunctivae and EOM are normal. Pupils are equal, round, and reactive to light. Right eye exhibits no discharge. Left eye exhibits no discharge. No scleral icterus.  Neck: Normal range of motion. Neck supple. No thyromegaly present.  No bruits thyromegaly or anterior cervical adenopathy  Cardiovascular: Normal rate, regular rhythm, normal heart sounds and intact distal pulses.   No murmur heard. The heart is regular at 72/m  Pulmonary/Chest: Effort normal and breath sounds normal. No respiratory distress. She has no wheezes. She has no rales.  Clear anteriorly and posteriorly  Abdominal: Soft. Bowel sounds are normal. She exhibits no mass. There is tenderness. There is no rebound and no guarding.  Slight suprapubic tenderness  Musculoskeletal: Normal range of motion. She exhibits tenderness. She exhibits no edema.  Soreness and right hip area  Lymphadenopathy:    She has no cervical adenopathy.  Neurological: She is alert and oriented to person, place, and time. She has normal reflexes. No cranial nerve deficit.  Skin: Skin is warm and dry. No rash noted.  Psychiatric: She has a normal mood and affect. Her behavior is normal. Judgment and thought content normal.  Nursing note and vitals reviewed.  BP 127/84 (BP Location: Left Arm)   Pulse 64   Temp 97.3 F (36.3 C) (Oral)   Ht '5\' 4"'$  (1.626 m)   Wt 165 lb (74.8 kg)   LMP 03/21/1971   BMI 28.32 kg/m         Assessment & Plan:  1. Essential hypertension -The blood pressure is good today on her current treatment regimen  and she will continue this. - BMP8+EGFR - CBC with Differential/Platelet - Hepatic function panel  2. ASCVD (arteriosclerotic cardiovascular disease) -Continue follow-up with cardiology - CBC with Differential/Platelet - Lipid panel  3. Gastroesophageal reflux disease, esophagitis presence not specified -Continue with ranitidine before dinner nightly - CBC with Differential/Platelet - Hepatic function panel  4. Paroxysmal atrial fibrillation (HCC) -The patient is in normal sinus rhythm today but she will continue to follow-up with cardiology - CBC with Differential/Platelet  5. Vitamin D deficiency -Continue with current treatment pending results of lab work - CBC with Differential/Platelet - VITAMIN D 25 Hydroxy (Vit-D Deficiency, Fractures) - DG [facility] DEXA; Future  6. HYPERCHOLESTEROLEMIA -The patient is statin intolerant and she will continue with as aggressive therapeutic lifestyle changes as possible - CBC with Differential/Platelet - Lipid panel  7. Screening for osteoporosis - DG [facility] DEXA; Future  8. Postmenopausal -She will make every effort to move slowly and be sure footed. - DG [facility] DEXA; Future  9. Recurrent urinary tract infection -Cipro 500 twice daily for 3 days or  one daily for 7 days as needed.  Meds ordered this encounter  Medications  . ciprofloxacin (CIPRO) 500 MG tablet    Sig: Take 1 tablet (500 mg total) by mouth daily with breakfast.    Dispense:  90 tablet    Refill:  0   Patient Instructions                       Medicare Annual Wellness Visit  [facility] and the medical providers at [facility] strive to bring you the best medical care.  In doing so we not only want to address your current medical conditions and concerns but also to detect new conditions early and prevent illness, disease and health-related problems.    Medicare offers a yearly Wellness Visit which allows our clinical staff to assess your need for  preventative services including immunizations, lifestyle education, counseling to decrease risk of preventable diseases and screening for fall risk and other medical concerns.    This visit is provided free of charge (no copay) for all Medicare recipients. The clinical pharmacists at [facility] have begun to conduct these Wellness Visits which will also include a thorough review of all your medications.    As you primary medical provider recommend that you make an appointment for your Annual Wellness Visit if you have not done so already this year.  You may set up this appointment before you leave today or you may call back ([phone]) and schedule an appointment.  Please make sure when you call that you mention that you are scheduling your Annual Wellness Visit with the clinical pharmacist so that the appointment may be made for the proper length of time.     Continue current medications. Continue good therapeutic lifestyle changes which include good diet and exercise. Fall precautions discussed with patient. If an FOBT was given today- please return it to our front desk. If you are over 47 years old - you may need Prevnar 23 or the adult Pneumonia vaccine.  **Flu shots are available--- please call and schedule a FLU-CLINIC appointment**  After your visit with [name] today you will receive a survey in the mail or online from [employer] regarding your care with [name]. Please take a moment to fill this out. Your feedback is very important to [name] as you can help [name] better understand your patient needs as well as improve your experience and satisfaction. WE CARE ABOUT YOU!!!   We will call with lab work results as soon as these become available We will call with the DEXA scan results as soon as it becomes available This winter, drink plenty of fluids stay well hydrated and keep the house as cool as possible Make every effort to not be in a hurry to decrease the risk of you're  following    Arrie Senate MD

## 2016-09-29 NOTE — Patient Instructions (Addendum)
Medicare Annual Wellness Visit  [facility] and the medical providers at [facility] strive to bring you the best medical care.  In doing so we not only want to address your current medical conditions and concerns but also to detect new conditions early and prevent illness, disease and health-related problems.    Medicare offers a yearly Wellness Visit which allows our clinical staff to assess your need for preventative services including immunizations, lifestyle education, counseling to decrease risk of preventable diseases and screening for fall risk and other medical concerns.    This visit is provided free of charge (no copay) for all Medicare recipients. The clinical pharmacists at [facility] have begun to conduct these Wellness Visits which will also include a thorough review of all your medications.    As you primary medical provider recommend that you make an appointment for your Annual Wellness Visit if you have not done so already this year.  You may set up this appointment before you leave today or you may call back [phone] and schedule an appointment.  Please make sure when you call that you mention that you are scheduling your Annual Wellness Visit with the clinical pharmacist so that the appointment may be made for the proper length of time.     Continue current medications. Continue good therapeutic lifestyle changes which include good diet and exercise. Fall precautions discussed with patient. If an FOBT was given today- please return it to our front desk. If you are over 24 years old - you may need Prevnar 52 or the adult Pneumonia vaccine.  **Flu shots are available--- please call and schedule a FLU-CLINIC appointment**  After your visit with [name] today you will receive a survey in the mail or online from [employer] regarding your care with [name]. Please take a moment to fill this out. Your feedback is very  important to [name] as you can help [name] better understand your patient needs as well as improve your experience and satisfaction. WE CARE ABOUT YOU!!!   We will call with lab work results as soon as these become available We will call with the DEXA scan results as soon as it becomes available This winter, drink plenty of fluids stay well hydrated and keep the house as cool as possible Make every effort to not be in a hurry to decrease the risk of you're following

## 2016-09-30 LAB — CBC WITH DIFFERENTIAL/PLATELET
BASOS ABS: 0 10*3/uL (ref 0.0–0.2)
Basos: 1 %
EOS (ABSOLUTE): 0.2 10*3/uL (ref 0.0–0.4)
Eos: 4 %
HEMOGLOBIN: 14.3 g/dL (ref 11.1–15.9)
Hematocrit: 41.7 % (ref 34.0–46.6)
IMMATURE GRANULOCYTES: 0 %
Immature Grans (Abs): 0 10*3/uL (ref 0.0–0.1)
LYMPHS ABS: 1.2 10*3/uL (ref 0.7–3.1)
Lymphs: 21 %
MCH: 30.7 pg (ref 26.6–33.0)
MCHC: 34.3 g/dL (ref 31.5–35.7)
MCV: 90 fL (ref 79–97)
MONOCYTES: 8 %
MONOS ABS: 0.5 10*3/uL (ref 0.1–0.9)
NEUTROS PCT: 66 %
Neutrophils Absolute: 3.8 10*3/uL (ref 1.4–7.0)
Platelets: 204 10*3/uL (ref 150–379)
RBC: 4.66 x10E6/uL (ref 3.77–5.28)
RDW: 13.2 % (ref 12.3–15.4)
WBC: 5.7 10*3/uL (ref 3.4–10.8)

## 2016-09-30 LAB — BMP8+EGFR
BUN / CREAT RATIO: 16 (ref 12–28)
BUN: 20 mg/dL (ref 8–27)
CALCIUM: 9.2 mg/dL (ref 8.7–10.3)
CHLORIDE: 101 mmol/L (ref 96–106)
CO2: 25 mmol/L (ref 18–29)
Creatinine, Ser: 1.29 mg/dL — ABNORMAL HIGH (ref 0.57–1.00)
GFR calc Af Amer: 45 mL/min/{1.73_m2} — ABNORMAL LOW (ref >59)
GFR calc non Af Amer: 39 mL/min/{1.73_m2} — ABNORMAL LOW (ref >59)
GLUCOSE: 84 mg/dL (ref 65–99)
Potassium: 4 mmol/L (ref 3.5–5.2)
Sodium: 141 mmol/L (ref 134–144)

## 2016-09-30 LAB — LIPID PANEL
CHOL/HDL RATIO: 5.1 ratio — AB (ref 0.0–4.4)
Cholesterol, Total: 215 mg/dL — ABNORMAL HIGH (ref 100–199)
HDL: 42 mg/dL (ref >39)
LDL CALC: 147 mg/dL — AB (ref 0–99)
TRIGLYCERIDES: 129 mg/dL (ref 0–149)
VLDL Cholesterol Cal: 26 mg/dL (ref 5–40)

## 2016-09-30 LAB — VITAMIN D 25 HYDROXY (VIT D DEFICIENCY, FRACTURES): Vit D, 25-Hydroxy: 53.2 ng/mL (ref 30.0–100.0)

## 2016-09-30 LAB — HEPATIC FUNCTION PANEL
ALBUMIN: 4.1 g/dL (ref 3.5–4.7)
ALT: 10 IU/L (ref 0–32)
AST: 17 IU/L (ref 0–40)
Alkaline Phosphatase: 77 IU/L (ref 39–117)
Bilirubin Total: 0.7 mg/dL (ref 0.0–1.2)
Bilirubin, Direct: 0.17 mg/dL (ref 0.00–0.40)
TOTAL PROTEIN: 7.1 g/dL (ref 6.0–8.5)

## 2016-10-02 ENCOUNTER — Telehealth: Payer: Self-pay | Admitting: Family Medicine

## 2016-10-03 DIAGNOSIS — C44319 Basal cell carcinoma of skin of other parts of face: Secondary | ICD-10-CM | POA: Diagnosis not present

## 2016-10-03 DIAGNOSIS — L821 Other seborrheic keratosis: Secondary | ICD-10-CM | POA: Diagnosis not present

## 2016-10-03 DIAGNOSIS — Z85828 Personal history of other malignant neoplasm of skin: Secondary | ICD-10-CM | POA: Diagnosis not present

## 2016-10-03 DIAGNOSIS — Z08 Encounter for follow-up examination after completed treatment for malignant neoplasm: Secondary | ICD-10-CM | POA: Diagnosis not present

## 2016-10-03 MED ORDER — CIPROFLOXACIN HCL 500 MG PO TABS: 500.0000 mg | ORAL_TABLET | Freq: Every day | ORAL | 0 refills | Status: DC

## 2016-10-03 NOTE — Telephone Encounter (Signed)
done

## 2016-10-31 DIAGNOSIS — M9902 Segmental and somatic dysfunction of thoracic region: Secondary | ICD-10-CM | POA: Diagnosis not present

## 2016-10-31 DIAGNOSIS — M5137 Other intervertebral disc degeneration, lumbosacral region: Secondary | ICD-10-CM | POA: Diagnosis not present

## 2016-10-31 DIAGNOSIS — I973 Postprocedural hypertension: Secondary | ICD-10-CM | POA: Diagnosis not present

## 2016-10-31 DIAGNOSIS — M9901 Segmental and somatic dysfunction of cervical region: Secondary | ICD-10-CM | POA: Diagnosis not present

## 2016-10-31 DIAGNOSIS — M9903 Segmental and somatic dysfunction of lumbar region: Secondary | ICD-10-CM | POA: Diagnosis not present

## 2016-11-01 DIAGNOSIS — M5137 Other intervertebral disc degeneration, lumbosacral region: Secondary | ICD-10-CM | POA: Diagnosis not present

## 2016-11-01 DIAGNOSIS — I973 Postprocedural hypertension: Secondary | ICD-10-CM | POA: Diagnosis not present

## 2016-11-01 DIAGNOSIS — M9902 Segmental and somatic dysfunction of thoracic region: Secondary | ICD-10-CM | POA: Diagnosis not present

## 2016-11-01 DIAGNOSIS — M9903 Segmental and somatic dysfunction of lumbar region: Secondary | ICD-10-CM | POA: Diagnosis not present

## 2016-11-01 DIAGNOSIS — M9901 Segmental and somatic dysfunction of cervical region: Secondary | ICD-10-CM | POA: Diagnosis not present

## 2016-11-08 DIAGNOSIS — M9903 Segmental and somatic dysfunction of lumbar region: Secondary | ICD-10-CM | POA: Diagnosis not present

## 2016-11-08 DIAGNOSIS — I973 Postprocedural hypertension: Secondary | ICD-10-CM | POA: Diagnosis not present

## 2016-11-08 DIAGNOSIS — M9901 Segmental and somatic dysfunction of cervical region: Secondary | ICD-10-CM | POA: Diagnosis not present

## 2016-11-08 DIAGNOSIS — M5137 Other intervertebral disc degeneration, lumbosacral region: Secondary | ICD-10-CM | POA: Diagnosis not present

## 2016-11-08 DIAGNOSIS — M9902 Segmental and somatic dysfunction of thoracic region: Secondary | ICD-10-CM | POA: Diagnosis not present

## 2016-11-13 ENCOUNTER — Encounter: Payer: Medicare Other | Admitting: Thoracic Surgery (Cardiothoracic Vascular Surgery)

## 2016-12-01 ENCOUNTER — Other Ambulatory Visit: Payer: Self-pay | Admitting: Family Medicine

## 2016-12-04 NOTE — Telephone Encounter (Signed)
Rx for Lorazepam called into pharmacy and pt is aware.

## 2016-12-06 ENCOUNTER — Telehealth: Payer: Self-pay | Admitting: Family Medicine

## 2016-12-06 MED ORDER — AMOXICILLIN 500 MG PO CAPS: 500.0000 mg | ORAL_CAPSULE | Freq: Three times a day (TID) | ORAL | 0 refills | Status: DC

## 2016-12-06 NOTE — Telephone Encounter (Signed)
So sorry about her daughter. Start amoxicillin 500 mg 3 times a day for 10 days and use nasal saline frequently during the day.

## 2016-12-06 NOTE — Telephone Encounter (Signed)
Pt aware.

## 2016-12-06 NOTE — Telephone Encounter (Signed)
Bilateral ears feel like they need to pop The left ear is hurting   She can not take flonase  Some cough and some congestion - mostly in the mornings  Wants amox   (she can't come in - her daughter don't have much time left/hospice)

## 2016-12-22 ENCOUNTER — Telehealth: Payer: Self-pay | Admitting: Cardiology

## 2016-12-22 NOTE — Telephone Encounter (Signed)
I will see her at 9:15 on Feb 7th in [place].  Please schedule.

## 2016-12-22 NOTE — Telephone Encounter (Signed)
New message   Pt c/o medication issue:  1. Name of Medication: metropolol  2. How are you currently taking this medication (dosage and times per day)? unknown  3. Are you having a reaction (difficulty breathing--STAT)? no  4. What is your medication issue? Pt said she has a medication issue but is having issues talking about it, she starts to cry and said she lost her daughter please call pt

## 2016-12-22 NOTE — Telephone Encounter (Signed)
Spoke with patient and she stated she is tired all the time and just does not feel like doing anything.  Feels like it is medication related and wants to discuss with Dr Percival Spanish She is requesting to see Dr Percival Spanish when he is [place] again Tried to offer patient an appointment soon however she does not want to drive to [facility]  She stated Dr Percival Spanish told her if she needed anything to call and he would get her in Explained that the schedule was completely full  Will forward to Dr Percival Spanish and Rexene Agent RN for review

## 2016-12-25 NOTE — Telephone Encounter (Signed)
Follow up   Pt verbalized that she wants rn to call her and she declined a reason

## 2016-12-25 NOTE — Telephone Encounter (Signed)
Pt aware of appt 2/7 at 9:15.  She states she has been under a lot of stress since her daughter passed after 1/12.   appt scheduled for 2/7 at 9:15.

## 2016-12-28 IMAGING — CR DG CHEST 1V PORT
1 series · 1 of 1 positions shown · non-contrast
Comparison: Portable chest x-ray November 02, 2015

CLINICAL DATA: Status post CABG on November 02, 2015

EXAM:
PORTABLE CHEST 1 VIEW

[AP]
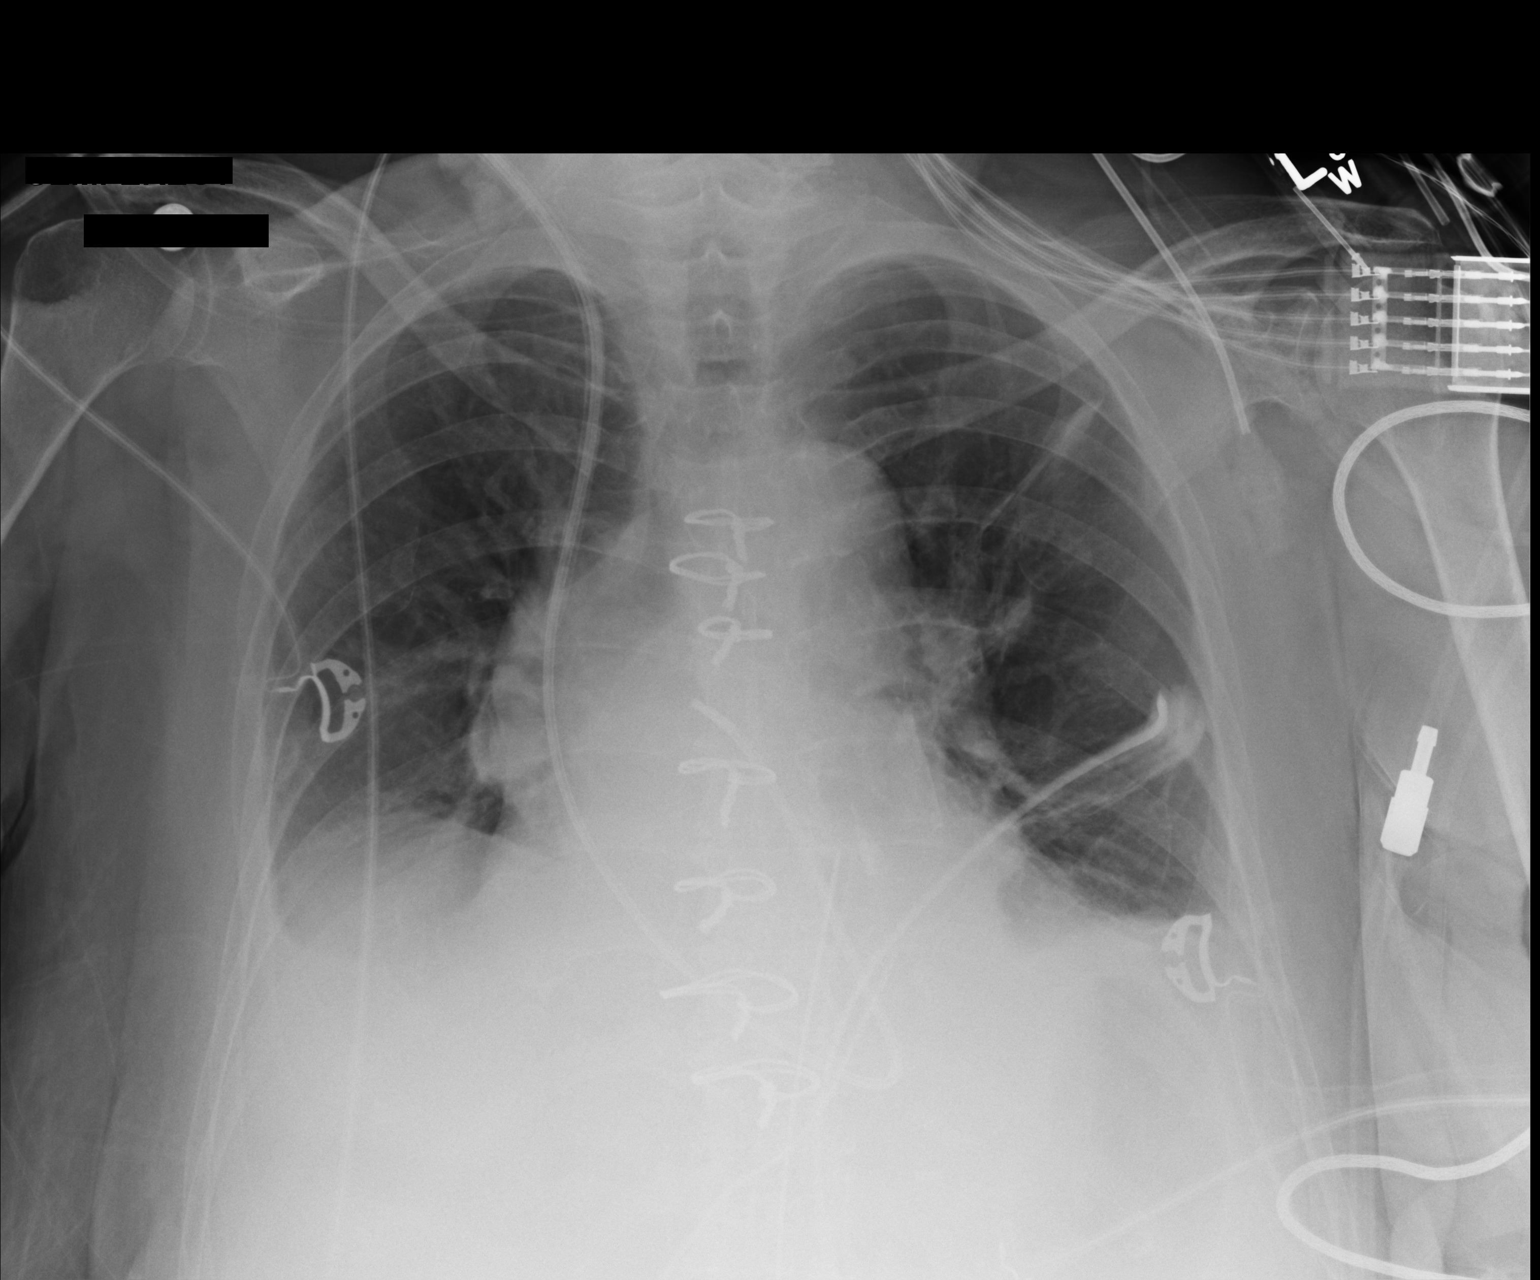

[1 of 1 positions shown; findings below may reference images not displayed]

FINDINGS: The lungs are mildly hypoinflated. There is a small right pleural
effusion and trace left pleural effusion. There is subsegmental
atelectasis at the left lung base and in the left upper lobe which
is stable. The cardiac silhouette is enlarged. The pulmonary
vascularity is not engorged.

The trachea and esophagus have been extubated. The Swan-Ganz
catheter tip projects in the proximal right main pulmonary artery.
The left-sided chest tubes are in stable position. The mediastinal
drain is unchanged. There are 7 intact sternal wires.
IMPRESSION: Stable small bilateral pleural effusions and left lower lobe
atelectasis. There is no significant pulmonary vascular congestion.
The remaining support tubes are in reasonable position.

## 2016-12-29 IMAGING — DX DG CHEST 2V
2 series · 2 of 2 positions shown · non-contrast
Comparison: November 03, 2015.

CLINICAL DATA: Status post thoracic surgery.

EXAM:
CHEST  2 VIEW

[w chest pa]
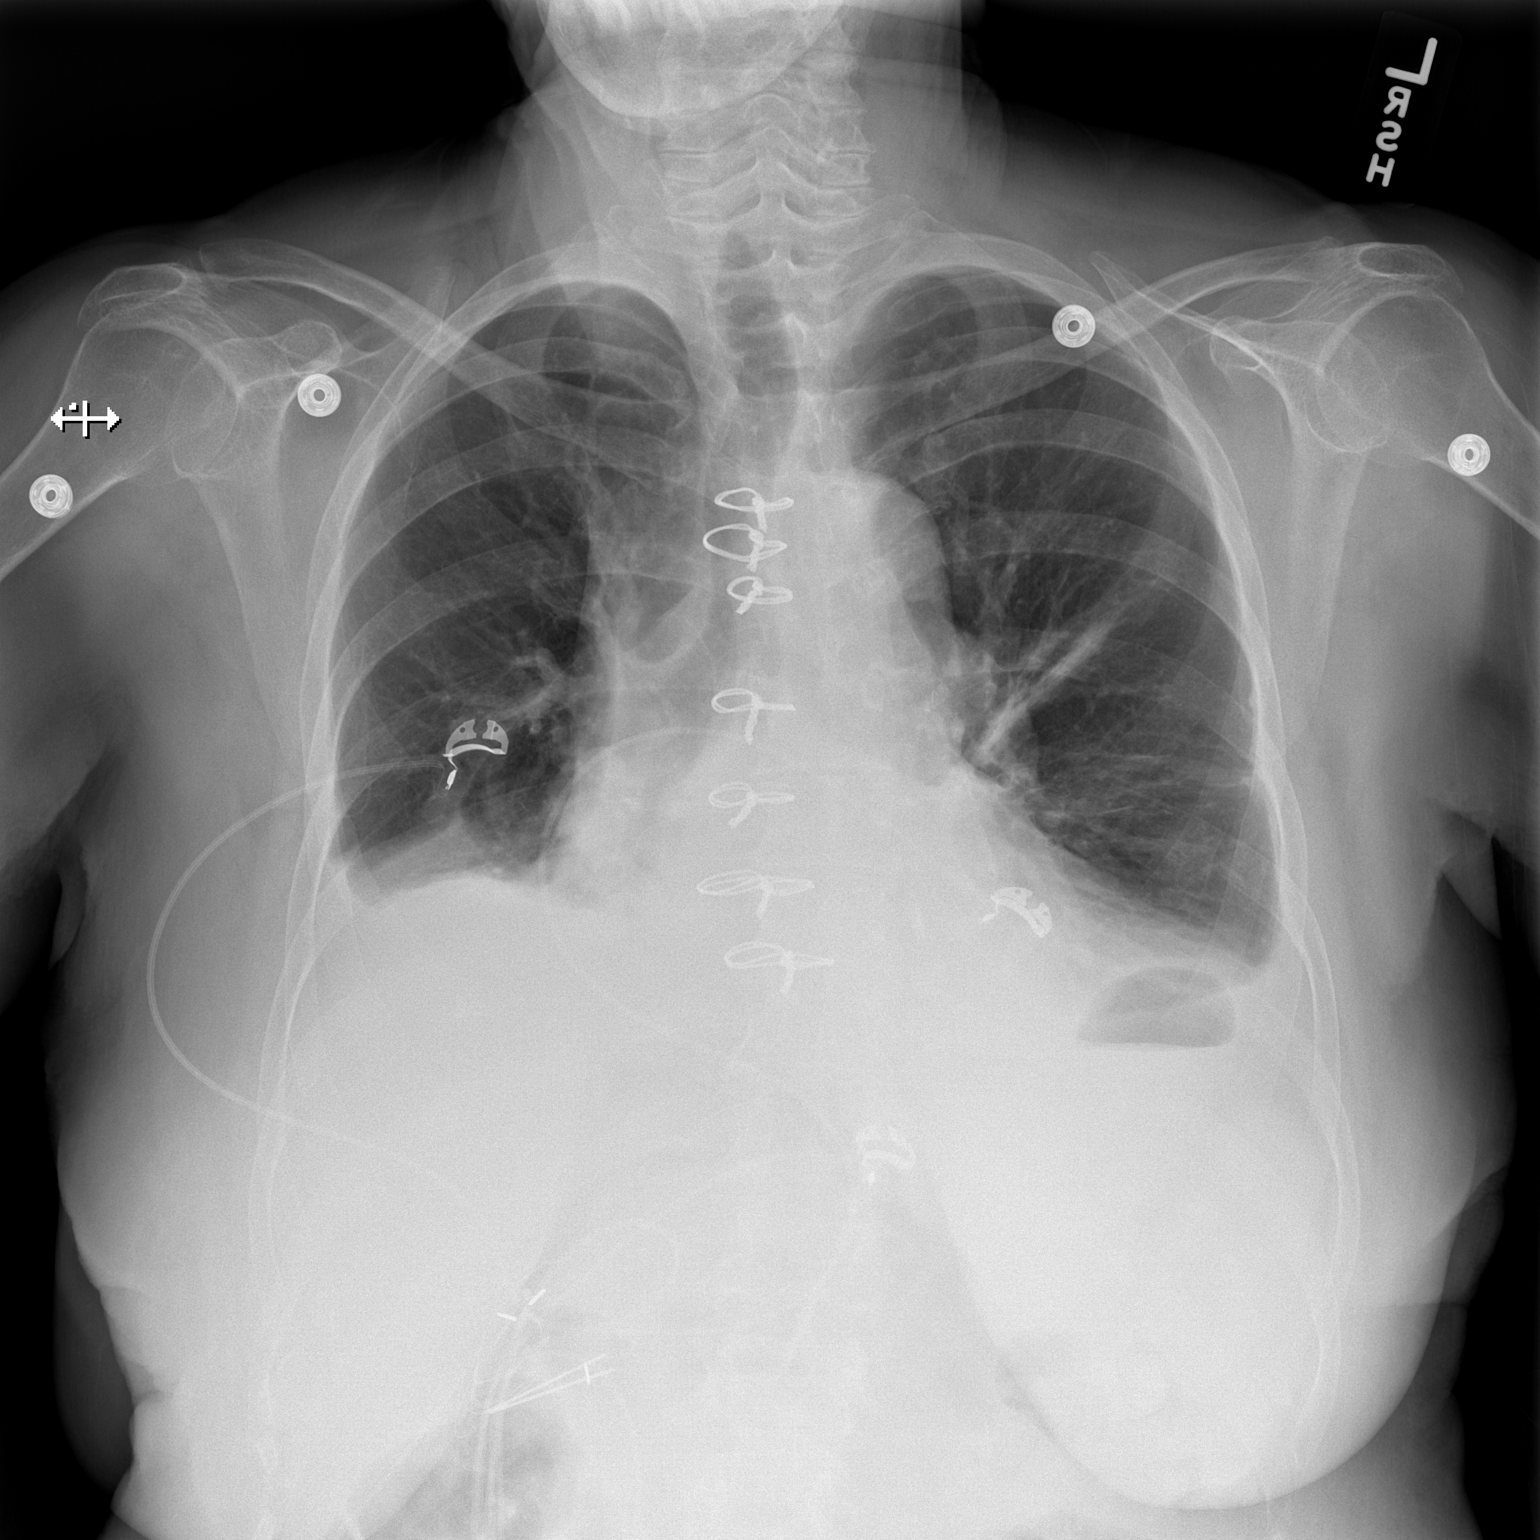

[w chest lat]
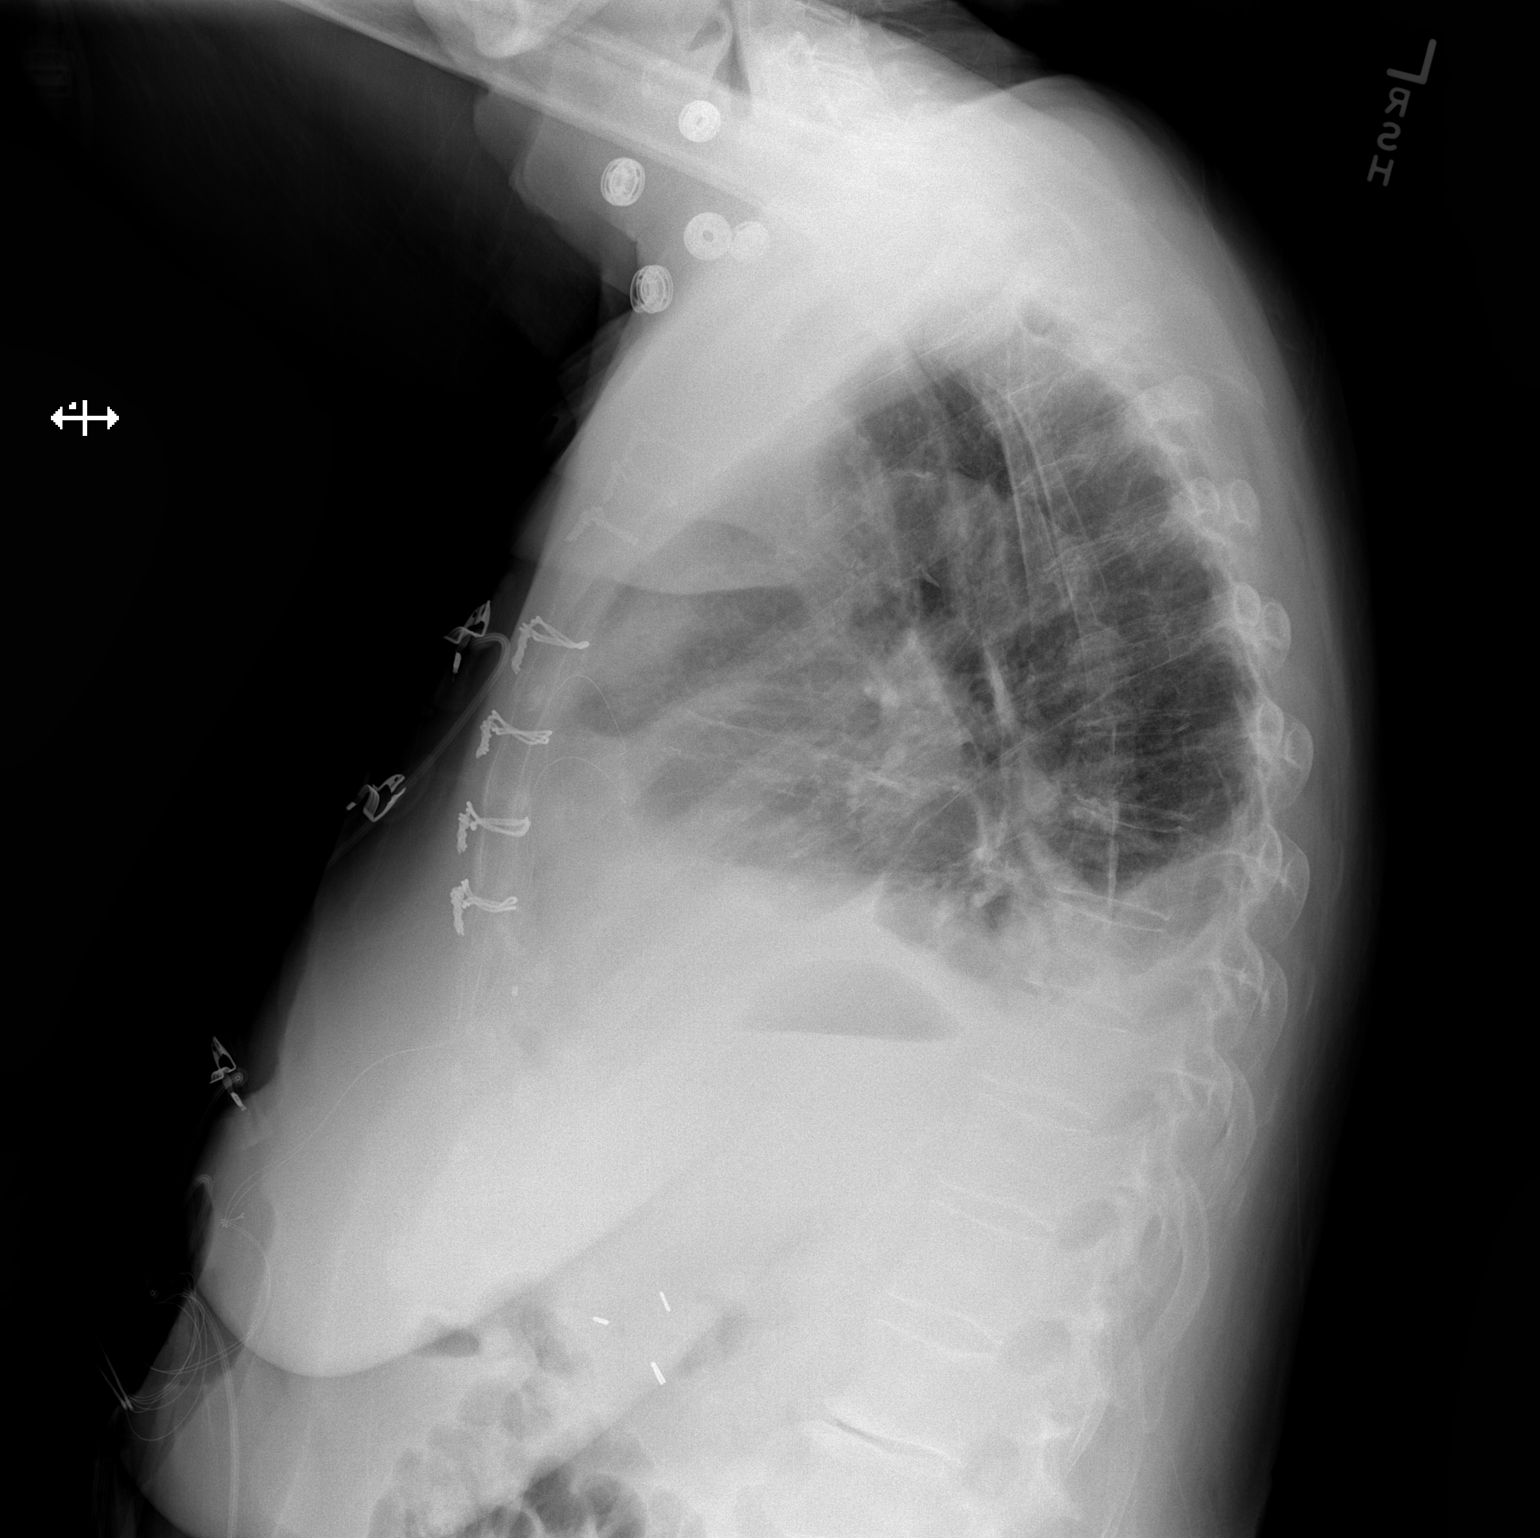

[2 of 2 positions shown; findings below may reference images not displayed]

FINDINGS: Stable cardiomegaly. Sternotomy wires are noted. No pneumothorax is
noted. Left-sided chest tube has been removed. No pneumothorax is
noted. Left perihilar subsegmental atelectasis is noted. Mild
bilateral pleural effusions remain. Bibasilar subsegmental
atelectasis is noted as well.
IMPRESSION: Left-sided chest tube has been removed. No pneumothorax is noted.
Left midlung and bibasilar subsegmental atelectasis is noted. Mild
bilateral pleural effusions are noted as well.

## 2016-12-31 NOTE — Progress Notes (Signed)
Cardiology Office Note   Date:  01/03/2017   ID:  Emily Wagner, DOB [birth year], MRN [id number]  PCP:  Redge Gainer, MD  Cardiologist:   Minus Breeding, MD   Chief Complaint  Patient presents with  . Shortness of Breath  . Fatigue      History of Present Illness: Emily Wagner is a 83 y.o. female who presents for follow-up after bypass surgery. Since I last saw her she has been very tired.  She called to be added to the schedule.  She has been under great stress since her daughter died.  Her daughter died secondary to complications of diabetes 3 weeks ago. Patient has not been sleeping well many nights.  She called today to the schedule because she's having some significant with any activities such as making the bed. This has been unchanged since her surgery. This was a predominant complaint she's not actually gotten any better since the surgery.  She does get short of breath with activities.  The patient denies any new symptoms such as chest discomfort, neck or arm discomfort. There has been no PND or orthopnea. There have been no reported palpitations, presyncope or syncope.   Past Medical History:  Diagnosis Date  . Adjustment disorder with mixed anxiety and depressed mood   . Atrial fibrillation ([place]) 11/04/2015   Post-operative  . Benign hypertensive heart disease without heart failure   . CAD (coronary artery disease)   . Infection of urinary tract 10/31/2015  . Interstitial cystitis   . Osteopenia   . Other and unspecified hyperlipidemia   . Other dyspnea and respiratory abnormality   . Pelvic fracture ([place]) 05/30/2015  . 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 ABDOMINAL HYSTERECTOMY     Cancer and endometriosis  . CARDIAC CATHETERIZATION  2008  . CARDIAC CATHETERIZATION N/A 10/29/2015   Procedure: Left Heart Cath and Coronary Angiography;  Surgeon: Belva Crome, MD;  Location: [facility];   Service: Cardiovascular;  Laterality: N/A;  . CATARACT EXTRACTION    . CHOLECYSTECTOMY    . CORONARY ARTERY BYPASS GRAFT N/A 11/02/2015   Procedure: OFF PUMP CORONARY ARTERY BYPASS GRAFTING (CABG) TIMES ONE;  Surgeon: Rexene Alberts, MD;  Location: [facility];  Service: Open Heart Surgery;  Laterality: N/A;  LIMA to LAD  . FRACTURE SURGERY     Right hip  . HERNIA REPAIR     Right inguinal  . TEE WITHOUT CARDIOVERSION N/A 11/02/2015   Procedure: TRANSESOPHAGEAL ECHOCARDIOGRAM (TEE);  Surgeon: Rexene Alberts, MD;  Location: [facility];  Service: Open Heart Surgery;  Laterality: N/A;     Current Outpatient Prescriptions  Medication Sig Dispense Refill  . amoxicillin (AMOXIL) 500 MG capsule Take 1 capsule (500 mg total) by mouth 3 (three) times daily. 30 capsule 0  . aspirin EC 81 MG tablet Take 81 mg by mouth daily.    . Cholecalciferol (VITAMIN D) 2000 UNITS CAPS Take 2,000 Units by mouth daily.     . ciprofloxacin (CIPRO) 500 MG tablet Take 1 tablet (500 mg total) by mouth daily with breakfast. 90 tablet 0  . hydrochlorothiazide (HYDRODIURIL) 25 MG tablet Take 1 tablet (25 mg total) by mouth daily. 90 tablet 3  . levothyroxine (SYNTHROID, LEVOTHROID) 50 MCG tablet Take 1 tablet (50 mcg total) by mouth daily before breakfast. 90 tablet 3  . lisinopril (PRINIVIL,ZESTRIL) 10  MG tablet Take 1 tablet (10 mg total) by mouth daily. 90 tablet 3  . LORazepam (ATIVAN) 0.5 MG tablet TAKE 1 TABLET TWICE A DAY 60 tablet 1  . Multiple Vitamins-Minerals (PRESERVISION AREDS PO) Take 1 tablet by mouth every other day.      No current facility-administered medications for this visit.     Allergies:   Nitrofurantoin; Nsaids; Other; Flonase [fluticasone propionate]; Prevnar 13 [pneumococcal 13-val conj vacc]; Alendronate sodium; Ciprofloxacin; Codeine; Crestor [rosuvastatin calcium]; Darvon; Lipitor [atorvastatin calcium]; Pravachol; Propranolol hcl; Sulfa antibiotics; Sulfamethoxazole; Ultram [tramadol hcl]; Welchol  [colesevelam hcl]; Zetia [ezetimibe]; and Zocor [simvastatin]    ROS:  Please see the history of present illness.   Otherwise, review of systems are positive for none.   All other systems are reviewed and negative.    PHYSICAL EXAM: VS:  BP 140/90   Ht 5\' 4"  (1.626 m)   Wt 169 lb (76.7 kg)   LMP 03/21/1971   BMI 29.01 kg/m  , BMI Body mass index is 29.01 kg/m. GENERAL:  Well appearing NECK:  No jugular venous distention, waveform within normal limits, carotid upstroke brisk and symmetric, no bruits, no thyromegaly LUNGS:  Clear to auscultation bilaterally BACK:  No CVA tenderness CHEST: Well healed sternotomy scar. HEART:  PMI not displaced or sustained,S1 and S2 within normal limits, no S3, no S4, no clicks, no rubs, no murmurs ABD:  Flat, positive bowel sounds normal in frequency in pitch, no bruits, no rebound, no guarding, no midline pulsatile mass, no hepatomegaly, no splenomegaly EXT:  2 plus pulses throughout, trace edema, no cyanosis no clubbing PSYCH:  Tearful   EKG:  EKG is  ordered today. Sinus rhythm, rate 63, axis within normal limits, poor anterior R-wave progression, no acute ST-T wave changes.  PACs   Recent Labs: 09/29/2016: ALT 10; BUN 20; Creatinine, Ser 1.29; Platelets 204; Potassium 4.0; Sodium 141    Lipid Panel    Component Value Date/Time   CHOL 215 (H) 09/29/2016 1006   TRIG 129 09/29/2016 1006   TRIG 162 (H) 06/24/2015 0918   HDL 42 09/29/2016 1006   HDL 44 06/24/2015 0918   CHOLHDL 5.1 (H) 09/29/2016 1006   CHOLHDL 5.4 10/31/2015 0215   VLDL 20 10/31/2015 0215   LDLCALC 147 (H) 09/29/2016 1006   LDLCALC 163 (H) 05/14/2014 1038      Wt Readings from Last 3 Encounters:  01/03/17 169 lb (76.7 kg)  09/29/16 165 lb (74.8 kg)  09/13/16 166 lb (75.3 kg)      Other studies Reviewed: Additional studies/ records that were reviewed today include:   Labs Review of the above records demonstrates:       ASSESSMENT AND PLAN:  CABG with  LIMA-LAD:  I don't think that she is having an anginal equivalent. I'm going to manage her fatigue as below.  FATIGUE:  This is the predominant complaint. I'm going to check a BNP, TSH and CBC. She wants to stop her beta blocker and I will stop this.   HYPOTENSION:  Her blood pressure had been running low.   It's actually normal today. I'm going to stop her beta blocker since she still fatigued. She'll keep her blood pressure 3 times a week.  ATRIAL [id number].   Her fibrillation was postop. She has had no recurrence.  No change in therapy is indicated.     Current medicines are reviewed at length with the patient today.  The patient does not have concerns regarding medicines.  The following  changes have been made:   As above  Labs/ tests ordered today include:     Orders Placed This Encounter  Procedures  . CBC  . TSH  . B Nat Peptide  . EKG 12-Lead     Disposition:   FU with me in 1 month.    Signed, Minus Breeding, MD  01/03/2017 6:45 PM    [facility]

## 2017-01-03 ENCOUNTER — Encounter: Payer: Self-pay | Admitting: Cardiology

## 2017-01-03 ENCOUNTER — Other Ambulatory Visit: Payer: Medicare Other

## 2017-01-03 ENCOUNTER — Ambulatory Visit (INDEPENDENT_AMBULATORY_CARE_PROVIDER_SITE_OTHER): Payer: Medicare Other | Admitting: Cardiology

## 2017-01-03 VITALS — BP 140/90 | Ht 64.0 in | Wt 169.0 lb

## 2017-01-03 DIAGNOSIS — Z79899 Other long term (current) drug therapy: Secondary | ICD-10-CM | POA: Diagnosis not present

## 2017-01-03 DIAGNOSIS — I10 Essential (primary) hypertension: Secondary | ICD-10-CM

## 2017-01-03 DIAGNOSIS — R0602 Shortness of breath: Secondary | ICD-10-CM | POA: Diagnosis not present

## 2017-01-03 DIAGNOSIS — I480 Paroxysmal atrial fibrillation: Secondary | ICD-10-CM

## 2017-01-03 NOTE — Patient Instructions (Signed)
Medication Instructions:  Please stop your Metoprolol. Continue all other medications as listed.  Labwork: Please have blood work today at [facility]. (BNP, CBC and TSH)  Follow-Up: Follow up in 4 weeks with Dr Percival Spanish in [place].  If you need a refill on your cardiac medications before your next appointment, please call your pharmacy.  Thank you for choosing Emily Wagner!!

## 2017-01-04 LAB — CBC
Hematocrit: 44 % (ref 34.0–46.6)
Hemoglobin: 15.1 g/dL (ref 11.1–15.9)
MCH: 31.5 pg (ref 26.6–33.0)
MCHC: 34.3 g/dL (ref 31.5–35.7)
MCV: 92 fL (ref 79–97)
PLATELETS: 205 10*3/uL (ref 150–379)
RBC: 4.8 x10E6/uL (ref 3.77–5.28)
RDW: 13.4 % (ref 12.3–15.4)
WBC: 5.6 10*3/uL (ref 3.4–10.8)

## 2017-01-04 LAB — TSH: TSH: 2.37 u[IU]/mL (ref 0.450–4.500)

## 2017-01-04 LAB — BRAIN NATRIURETIC PEPTIDE: BNP: 21.6 pg/mL (ref 0.0–100.0)

## 2017-01-25 ENCOUNTER — Encounter: Payer: Self-pay | Admitting: Cardiology

## 2017-01-30 DIAGNOSIS — L03031 Cellulitis of right toe: Secondary | ICD-10-CM | POA: Diagnosis not present

## 2017-01-30 NOTE — Progress Notes (Signed)
Cardiology Office Note   Date:  01/31/2017   ID:  Emily Wagner, DOB 1933/07/21, MRN [id number]  PCP:  Redge Gainer, MD  Cardiologist:   Minus Breeding, MD   Chief Complaint  Patient presents with  . Fatigue      History of Present Illness: Emily Wagner is a 83 y.o. female who presents for follow-up after bypass surgery. When I last saw her she complained of being very tired.  She has been under great stress since her daughter died.  Her daughter died secondary to complications of diabetes.  I stopped her beta blocker at the last appt.   CBC, TSH and BNP were normal.  She returns for follow up.  Since then she has done much better.  She is less fatigued and able to do her chores and ride her stationary bike.   The patient denies any new symptoms such as chest discomfort, neck or arm discomfort. There has been no new shortness of breath, PND or orthopnea. There have been no reported palpitations, presyncope or syncope.  Her BP is well controlled off of the beta blocker.   Past Medical History:  Diagnosis Date  . Adjustment disorder with mixed anxiety and depressed mood   . Atrial fibrillation ([place]) 11/04/2015   Post-operative  . Benign hypertensive heart disease without heart failure   . CAD (coronary artery disease)   . Infection of urinary tract 10/31/2015  . Interstitial cystitis   . Osteopenia   . Other and unspecified hyperlipidemia   . Other dyspnea and respiratory abnormality   . Pelvic fracture ([place]) 05/30/2015  . 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 ABDOMINAL HYSTERECTOMY     Cancer and endometriosis  . CARDIAC CATHETERIZATION  2008  . CARDIAC CATHETERIZATION N/A 10/29/2015   Procedure: Left Heart Cath and Coronary Angiography;  Surgeon: Belva Crome, MD;  Location: [facility];  Service: Cardiovascular;  Laterality: N/A;  . CATARACT EXTRACTION    . CHOLECYSTECTOMY    . CORONARY ARTERY  BYPASS GRAFT N/A 11/02/2015   Procedure: OFF PUMP CORONARY ARTERY BYPASS GRAFTING (CABG) TIMES ONE;  Surgeon: Rexene Alberts, MD;  Location: [facility];  Service: Open Heart Surgery;  Laterality: N/A;  LIMA to LAD  . FRACTURE SURGERY     Right hip  . HERNIA REPAIR     Right inguinal  . TEE WITHOUT CARDIOVERSION N/A 11/02/2015   Procedure: TRANSESOPHAGEAL ECHOCARDIOGRAM (TEE);  Surgeon: Rexene Alberts, MD;  Location: [facility];  Service: Open Heart Surgery;  Laterality: N/A;     Current Outpatient Prescriptions  Medication Sig Dispense Refill  . aspirin EC 81 MG tablet Take 81 mg by mouth daily.    . Cholecalciferol (VITAMIN D) 2000 UNITS CAPS Take 2,000 Units by mouth daily.     . ciprofloxacin (CIPRO) 500 MG tablet Take 500 mg by mouth daily as needed.    . hydrochlorothiazide (HYDRODIURIL) 25 MG tablet Take 1 tablet (25 mg total) by mouth daily. 90 tablet 3  . levothyroxine (SYNTHROID, LEVOTHROID) 50 MCG tablet Take 1 tablet (50 mcg total) by mouth daily before breakfast. 90 tablet 3  . lisinopril (PRINIVIL,ZESTRIL) 10 MG tablet Take 1 tablet (10 mg total) by mouth daily. 90 tablet 3  . LORazepam (ATIVAN) 0.5 MG tablet Take 0.5 mg by mouth 2 (two) times daily.    . Multiple Vitamins-Minerals (  PRESERVISION AREDS PO) Take 1 tablet by mouth every other day.      No current facility-administered medications for this visit.     Allergies:   Nitrofurantoin; Nsaids; Other; Flonase [fluticasone propionate]; Prevnar 13 [pneumococcal 13-val conj vacc]; Alendronate sodium; Ciprofloxacin; Codeine; Crestor [rosuvastatin calcium]; Darvon; Lipitor [atorvastatin calcium]; Pravachol; Propranolol hcl; Sulfa antibiotics; Sulfamethoxazole; Ultram [tramadol hcl]; Welchol [colesevelam hcl]; Zetia [ezetimibe]; and Zocor [simvastatin]    ROS:  Please see the history of present illness.   Otherwise, review of systems are positive for none.   All other systems are reviewed and negative.    PHYSICAL EXAM: VS:  BP  124/86   Pulse 64   Ht 5\' 4"  (1.626 m)   Wt 161 lb (73 kg)   LMP 03/21/1971   BMI 27.64 kg/m  , BMI Body mass index is 27.64 kg/m. GENERAL:  Well appearing NECK:  No jugular venous distention, waveform within normal limits, carotid upstroke brisk and symmetric, no bruits, no thyromegaly LUNGS:  Clear to auscultation bilaterally BACK:  No CVA tenderness CHEST: Well healed sternotomy scar. HEART:  PMI not displaced or sustained,S1 and S2 within normal limits, no S3, no S4, no clicks, no rubs, no murmurs ABD:  Flat, positive bowel sounds normal in frequency in pitch, no bruits, no rebound, no guarding, no midline pulsatile mass, no hepatomegaly, no splenomegaly EXT:  2 plus pulses throughout, trace edema, no cyanosis no clubbing PSYCH:  Normal affect.    EKG:  EKG is  ordered today. Sinus rhythm, rate 80, axis within normal limits, poor anterior R-wave progression, no acute ST-T wave changes.  PACs   Recent Labs: 09/29/2016: ALT 10; BUN 20; Creatinine, Ser 1.29; Potassium 4.0; Sodium 141 01/03/2017: BNP 21.6; Platelets 205; TSH 2.370    Lipid Panel    Component Value Date/Time   CHOL 215 (H) 09/29/2016 1006   TRIG 129 09/29/2016 1006   TRIG 162 (H) 06/24/2015 0918   HDL 42 09/29/2016 1006   HDL 44 06/24/2015 0918   CHOLHDL 5.1 (H) 09/29/2016 1006   CHOLHDL 5.4 10/31/2015 0215   VLDL 20 10/31/2015 0215   LDLCALC 147 (H) 09/29/2016 1006   LDLCALC 163 (H) 05/14/2014 1038      Wt Readings from Last 3 Encounters:  01/31/17 161 lb (73 kg)  01/03/17 169 lb (76.7 kg)  09/29/16 165 lb (74.8 kg)      Other studies Reviewed: Additional studies/ records that were reviewed today include:   None Review of the above records demonstrates:       ASSESSMENT AND PLAN:  CABG with LIMA-LAD:  The patient has no new sypmtoms.  No further cardiovascular testing is indicated.  We will continue with aggressive risk reduction and meds as listed.  FATIGUE:  This is much improved and likely  related to grief, decreased sleep and beta blockers.  No change in therapy is planned  HYPOTENSION:   The blood pressure is at target. No change in medications is indicated. We will continue with therapeutic lifestyle changes (TLC).  ATRIAL [id number].   Her fibrillation was postop. She has had no recurrence.  No change in therapy is indicated.      Current medicines are reviewed at length with the patient today.  The patient does not have concerns regarding medicines.  The following changes have been made:   None  Labs/ tests ordered today include:    None  Orders Placed This Encounter  Procedures  . EKG 12-Lead     Disposition:  FU with me in 6 months.    Signed, Minus Breeding, MD  01/31/2017 8:32 PM    [facility]

## 2017-01-31 ENCOUNTER — Ambulatory Visit (INDEPENDENT_AMBULATORY_CARE_PROVIDER_SITE_OTHER): Payer: Medicare Other | Admitting: Cardiology

## 2017-01-31 ENCOUNTER — Ambulatory Visit: Payer: Medicare Other | Admitting: Cardiology

## 2017-01-31 ENCOUNTER — Encounter: Payer: Self-pay | Admitting: Cardiology

## 2017-01-31 VITALS — BP 124/86 | HR 64 | Ht 64.0 in | Wt 161.0 lb

## 2017-01-31 DIAGNOSIS — R5383 Other fatigue: Secondary | ICD-10-CM

## 2017-01-31 DIAGNOSIS — I10 Essential (primary) hypertension: Secondary | ICD-10-CM

## 2017-01-31 DIAGNOSIS — Z951 Presence of aortocoronary bypass graft: Secondary | ICD-10-CM | POA: Diagnosis not present

## 2017-01-31 MED ORDER — HYDROCHLOROTHIAZIDE 25 MG PO TABS: 25.0000 mg | ORAL_TABLET | Freq: Every day | ORAL | 3 refills | Status: DC

## 2017-01-31 MED ORDER — LISINOPRIL 10 MG PO TABS: 10.0000 mg | ORAL_TABLET | Freq: Every day | ORAL | 3 refills | Status: DC

## 2017-01-31 NOTE — Patient Instructions (Signed)
Medication Instructions:  The current medical regimen is effective;  continue present plan and medications.  Follow-Up: Follow up in 6 months with Dr. Hochrein.  You will receive a letter in the mail 2 months before you are due.  Please call us when you receive this letter to schedule your follow up appointment.  If you need a refill on your cardiac medications before your next appointment, please call your pharmacy.  Thank you for choosing [facility]!!       

## 2017-02-07 ENCOUNTER — Telehealth: Payer: Self-pay | Admitting: Family Medicine

## 2017-02-07 ENCOUNTER — Other Ambulatory Visit: Payer: Self-pay | Admitting: *Deleted

## 2017-02-07 MED ORDER — LEVOTHYROXINE SODIUM 50 MCG PO TABS
50.0000 ug | ORAL_TABLET | Freq: Every day | ORAL | 3 refills | Status: DC
Start: 2017-02-07 — End: 2017-09-17

## 2017-02-07 MED ORDER — MECLIZINE HCL 12.5 MG PO TABS: 12.5000 mg | ORAL_TABLET | Freq: Three times a day (TID) | ORAL | 0 refills | Status: DC | PRN

## 2017-02-07 MED ORDER — CIPROFLOXACIN HCL 500 MG PO TABS: 500.0000 mg | ORAL_TABLET | Freq: Every day | ORAL | 0 refills | Status: DC | PRN

## 2017-02-07 NOTE — Telephone Encounter (Signed)
Pt wants to tell jamie to not do anything with med until her appt

## 2017-02-27 ENCOUNTER — Telehealth: Payer: Self-pay | Admitting: Cardiology

## 2017-02-27 NOTE — Telephone Encounter (Signed)
Returned call to patient She states she has been weak & jittery for the past few days - since Sunday  BP is running 102/70, 109/85, 99/70, 106/75, 101/70s Patient stays hydrated She was so tired last night and slept 12 hours last night Verified patient's medications - she takes hctz QAM & lisinopril in the evening She has been on these medications since at least 2016 and no recent dose changes Patient sees PCP Friday 4/6  Advised patient keep a log of BP & HR Monitor BP note more than twice daily, 1-2 hours after taking any BP medications, after resting for 5-10 minutes in a sitting position.  Advised that she take these readings & her BP cuff to her PCP appt.  Informed her that med changes are not generally made off just 2 days worth of BP readings and it would be best to capture more readings to accurately assess her BP  She voiced understanding.   Routed message to Dr. Percival Spanish & DOD (Dr. Claiborne Billings for further advice)

## 2017-02-27 NOTE — Telephone Encounter (Signed)
New message   Pt c/o BP issue: STAT if pt c/o blurred vision, one-sided weakness or slurred speech  1. What are your last 5 BP readings? 101/72  2. Are you having any other symptoms (ex. Dizziness, headache, blurred vision, passed out)? Pt states she feels very weak and jittery  3. What is your BP issue? Pt says her bp has been running low.

## 2017-02-28 NOTE — Telephone Encounter (Signed)
Agree; keep appt with PCP this week

## 2017-03-02 ENCOUNTER — Other Ambulatory Visit: Payer: Self-pay | Admitting: *Deleted

## 2017-03-02 ENCOUNTER — Encounter: Payer: Self-pay | Admitting: Family Medicine

## 2017-03-02 ENCOUNTER — Ambulatory Visit (INDEPENDENT_AMBULATORY_CARE_PROVIDER_SITE_OTHER): Payer: Medicare Other | Admitting: Family Medicine

## 2017-03-02 VITALS — BP 139/87 | HR 88 | Temp 96.9°F | Ht 64.0 in | Wt 167.0 lb

## 2017-03-02 DIAGNOSIS — K219 Gastro-esophageal reflux disease without esophagitis: Secondary | ICD-10-CM

## 2017-03-02 DIAGNOSIS — E559 Vitamin D deficiency, unspecified: Secondary | ICD-10-CM

## 2017-03-02 DIAGNOSIS — I10 Essential (primary) hypertension: Secondary | ICD-10-CM

## 2017-03-02 DIAGNOSIS — I480 Paroxysmal atrial fibrillation: Secondary | ICD-10-CM | POA: Diagnosis not present

## 2017-03-02 DIAGNOSIS — E7800 Pure hypercholesterolemia, unspecified: Secondary | ICD-10-CM | POA: Diagnosis not present

## 2017-03-02 DIAGNOSIS — I2510 Atherosclerotic heart disease of native coronary artery without angina pectoris: Secondary | ICD-10-CM | POA: Diagnosis not present

## 2017-03-02 MED ORDER — VERAPAMIL HCL 120 MG PO TABS: ORAL_TABLET | ORAL | 0 refills | Status: DC

## 2017-03-02 NOTE — Addendum Note (Signed)
Addended by: Zannie Cove on: 03/02/2017 09:48 AM   Modules accepted: Orders

## 2017-03-02 NOTE — Patient Instructions (Addendum)
Medicare Annual Wellness Visit  [facility] and the medical providers at [facility] strive to bring you the best medical care.  In doing so we not only want to address your current medical conditions and concerns but also to detect new conditions early and prevent illness, disease and health-related problems.    Medicare offers a yearly Wellness Visit which allows our clinical staff to assess your need for preventative services including immunizations, lifestyle education, counseling to decrease risk of preventable diseases and screening for fall risk and other medical concerns.    This visit is provided free of charge (no copay) for all Medicare recipients. The clinical pharmacists at [facility] have begun to conduct these Wellness Visits which will also include a thorough review of all your medications.    As you primary medical provider recommend that you make an appointment for your Annual Wellness Visit if you have not done so already this year.  You may set up this appointment before you leave today or you may call back ([phone]) and schedule an appointment.  Please make sure when you call that you mention that you are scheduling your Annual Wellness Visit with the clinical pharmacist so that the appointment may be made for the proper length of time.     Continue current medications. Continue good therapeutic lifestyle changes which include good diet and exercise. Fall precautions discussed with patient. If an FOBT was given today- please return it to our front desk. If you are over 2 years old - you may need Prevnar 31 or the adult Pneumonia vaccine.  **Flu shots are available--- please call and schedule a FLU-CLINIC appointment**  After your visit with [name] today you will receive a survey in the mail or online from [employer] regarding your care with [name]. Please take a moment to fill this out. Your feedback is very  important to [name] as you can help [name] better understand your patient needs as well as improve your experience and satisfaction. WE CARE ABOUT YOU!!!   Continue to get out and be more active in the community Continue to be [place] for all the blessing is you have received from the good Lord and the people that surrounds you Stay on current diuretic and bring blood pressure readings by in 3-4 weeks for review As long as the blood pressure readings are good and stable we will not add any additional medicine There is a prescription for verapamil at the drugstore which you can get filled if the blood pressures run higher than normal and he can start on this and take one half pill twice daily but did not start this as long as the blood pressures are good.

## 2017-03-02 NOTE — Progress Notes (Signed)
Subjective:    Patient ID: Emily Wagner, female    DOB: Apr 28, 1933, 83 y.o.   MRN: [id number]  HPI Pt here for follow up and management of chronic medical problems which includes hypertension,a fib, and hyperlipidemia. She is taking medication regularly.The patient has had some problems with her blood pressure being too high. This is especially since she stopped her lisinopril completely. With the lisinopril however the readings were too low. And since she's been off the medicine is been running higher. She is still taking her hydrochlorothiazide 25 mg daily. She brings in lab work for review and these labs were done on February 8 of this year. Her hemoglobin was good and her white blood cell count was normal. The platelet count was adequate. The blood sugar was good at 84. The creatinine however was slightly elevated at 1.29. Electrolytes including potassium are good. The vitamin D level was good at 53.2. All liver function tests were normal. Cholesterol numbers with traditional lipid testing have an LDL C cholesterol that was elevated at 147 and of course she is had bypass surgery and this is too high but she's been intolerant to statin drugs in the past. Triglycerides were good at 129 and the good cholesterol was within normal limits at 42. This lab work was done at an outside office and it will be scanned into her record. The patient is tearful this morning in talking about the loss of her daughter. She's been through quite a bit over the past several months with bypass surgery and her daughter's illness. She denies any chest pain or shortness of breath. She denies any trouble with her stomach including nausea vomiting diarrhea blood in the stool or black tarry bowel movements. She is swallowing without problems. She is passing her water without problems. She says she is drinking plenty of water. She brings in the blood pressures for review and all of these look good and we will not do anything more  than continue to watch her blood pressures at this point in time and if they go up we will start her on verapamil 120 one half twice daily and then monitor the blood pressures again after starting that. But for now, no more medicine other than what she is currently taking. The lab work was done by the cardiologist and we did a little over that with her.   Patient Active Problem List   Diagnosis Date Noted  . Long term (current) use of anticoagulants 11/11/2015  . Atrial fibrillation ([place]) 11/04/2015  . 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 Infection of urinary tract 10/31/2015  . Unstable angina ([place]) 10/29/2015  . CAD (coronary artery disease) 10/29/2015  . Benign hypertensive heart disease without heart failure   . Interstitial cystitis   . Osteopenia   . Pelvic fracture ([place]) 05/30/2015  . Allergy to multiple medications 07/15/2013  . Renal insufficiency   . Adjustment disorder with mixed anxiety and depressed mood   . Osteoporosis   . ASCVD (arteriosclerotic cardiovascular disease)   . 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  . HYPERCHOLESTEROLEMIA 05/02/2008  . Essential hypertension 05/02/2008  . INTERSTITIAL CYSTITIS 05/02/2008  . DIVERTICULOSIS OF COLON 08/27/2003   Outpatient Encounter Prescriptions as of 03/02/2017  Medication Sig  . aspirin EC 81 MG tablet Take 81 mg by mouth daily.  . Cholecalciferol (VITAMIN D) 2000 UNITS CAPS Take 2,000 Units by mouth daily.   . hydrochlorothiazide (HYDRODIURIL) 25 MG tablet Take 1 tablet (25 mg total) by mouth  daily.  . levothyroxine (SYNTHROID, LEVOTHROID) 50 MCG tablet Take 1 tablet (50 mcg total) by mouth daily before breakfast.  . lisinopril (PRINIVIL,ZESTRIL) 10 MG tablet Take 1 tablet (10 mg total) by mouth daily.  Marland Kitchen LORazepam (ATIVAN) 0.5 MG tablet Take 0.5 mg by mouth 2 (two) times daily.  . Multiple Vitamins-Minerals (PRESERVISION AREDS PO) Take 1 tablet by mouth every other day.   . [DISCONTINUED] ciprofloxacin (CIPRO) 500 MG tablet  Take 1 tablet (500 mg total) by mouth daily as needed.  . [DISCONTINUED] meclizine (ANTIVERT) 12.5 MG tablet Take 1 tablet (12.5 mg total) by mouth 3 (three) times daily as needed for dizziness.   No facility-administered encounter medications on file as of 03/02/2017.      Review of Systems  Constitutional: Positive for fatigue.  HENT: Negative.   Eyes: Negative.   Respiratory: Negative.   Cardiovascular: Negative.        Bp up and down  Gastrointestinal: Negative.   Endocrine: Negative.   Genitourinary: Negative.   Musculoskeletal: Negative.   Skin: Negative.   Allergic/Immunologic: Negative.   Neurological: Negative.   Hematological: Negative.   Psychiatric/Behavioral: Negative.        Objective:   Physical Exam  Constitutional: She is oriented to person, place, and time. She appears well-developed and well-nourished. She appears distressed.  Patient is alert but tearful and crying because of dealing with all the stress issues dealt with over the past several months.  HENT:  Head: Normocephalic and atraumatic.  Right Ear: External ear normal.  Left Ear: External ear normal.  Nose: Nose normal.  Mouth/Throat: Oropharynx is clear and moist.  Eyes: Conjunctivae and EOM are normal. Pupils are equal, round, and reactive to light. Right eye exhibits no discharge. Left eye exhibits no discharge. No scleral icterus.  Neck: Normal range of motion. Neck supple. No thyromegaly present.  No bruits thyromegaly or anterior cervical adenopathy  Cardiovascular: Normal rate, regular rhythm and intact distal pulses.   No murmur heard. The heart is regular at 72/m  Pulmonary/Chest: Effort normal and breath sounds normal. No respiratory distress. She has no wheezes. She has no rales.  Lungs are clear anteriorly and posteriorly  Abdominal: Soft. Bowel sounds are normal. She exhibits no mass. There is no tenderness. There is no rebound and no guarding.  No abdominal tenderness masses or bruits   Musculoskeletal: She exhibits no edema.  Somewhat unstable gait secondary to past hip surgery.  Lymphadenopathy:    She has no cervical adenopathy.  Neurological: She is alert and oriented to person, place, and time. She has normal reflexes. No cranial nerve deficit.  Skin: Skin is warm and dry. No rash noted.  Psychiatric: She has a normal mood and affect. Her behavior is normal. Judgment and thought content normal.  Nursing note and vitals reviewed.  BP 139/87 (BP Location: Right Arm)   Pulse 88   Temp (!) 96.9 F (36.1 C) (Oral)   Ht 5\' 4"  (1.626 m)   Wt 167 lb (75.8 kg)   LMP 03/21/1971   BMI 28.67 kg/m         Assessment & Plan:  1. Essential hypertension -The blood pressure is good today only on HCTZ and off lisinopril for several days. She will continue to monitor this at home and bring his readings for review and 3-4 weeks. If the blood pressure goes back up and becomes greater consistently then 140/90 we will add verapamil 120 one half twice daily at that time.  2.  Gastroesophageal reflux disease, esophagitis presence not specified -Continue current treatment and she is having no symptoms with reflux.  3. ASCVD (arteriosclerotic cardiovascular disease) -Continue follow-up with cardiology  4. Paroxysmal atrial fibrillation (HCC) -The heart had a regular rate and rhythm today and she will continue to follow-up with cardiology  5. Vitamin D deficiency -Continue with vitamin D replacement  6. HYPERCHOLESTEROLEMIA -The patient is statin intolerant and will have to continue with as aggressive therapeutic lifestyle changes as possible.  No orders of the defined types were placed in this encounter.  Patient Instructions                       Medicare Annual Wellness Visit  [facility] and the medical providers at [facility] strive to bring you the best medical care.  In doing so we not only want to address your current medical conditions and  concerns but also to detect new conditions early and prevent illness, disease and health-related problems.    Medicare offers a yearly Wellness Visit which allows our clinical staff to assess your need for preventative services including immunizations, lifestyle education, counseling to decrease risk of preventable diseases and screening for fall risk and other medical concerns.    This visit is provided free of charge (no copay) for all Medicare recipients. The clinical pharmacists at [facility] have begun to conduct these Wellness Visits which will also include a thorough review of all your medications.    As you primary medical provider recommend that you make an appointment for your Annual Wellness Visit if you have not done so already this year.  You may set up this appointment before you leave today or you may call back ([phone]) and schedule an appointment.  Please make sure when you call that you mention that you are scheduling your Annual Wellness Visit with the clinical pharmacist so that the appointment may be made for the proper length of time.     Continue current medications. Continue good therapeutic lifestyle changes which include good diet and exercise. Fall precautions discussed with patient. If an FOBT was given today- please return it to our front desk. If you are over 66 years old - you may need Prevnar 45 or the adult Pneumonia vaccine.  **Flu shots are available--- please call and schedule a FLU-CLINIC appointment**  After your visit with [name] today you will receive a survey in the mail or online from [employer] regarding your care with [name]. Please take a moment to fill this out. Your feedback is very important to [name] as you can help [name] better understand your patient needs as well as improve your experience and satisfaction. WE CARE ABOUT YOU!!!   Continue to get out and be more active in the community Continue to be [place] for all the blessing is you  have received from the good Lord and the people that surrounds you Stay on current diuretic and bring blood pressure readings by in 3-4 weeks for review As long as the blood pressure readings are good and stable we will not add any additional medicine There is a prescription for verapamil at the drugstore which you can get filled if the blood pressures run higher than normal and he can start on this and take one half pill twice daily but did not start this as long as the blood pressures are good.    Arrie Senate MD

## 2017-03-06 ENCOUNTER — Telehealth: Payer: Self-pay | Admitting: Family Medicine

## 2017-03-06 NOTE — Telephone Encounter (Signed)
She brings in the blood pressures for review and all of these look good and we will not do anything more than continue to watch her blood pressures at this point in time and if they go up we will start her on verapamil 120 one half twice daily and then monitor the blood pressures again after starting that. But for now, no more medicine other than what she is currently taking.    This was in DWM's note from the visit = I left a detailed message for pt about this No verapamil or any other Bp meds have been sent in -jhb

## 2017-03-12 ENCOUNTER — Other Ambulatory Visit: Payer: Self-pay | Admitting: Family Medicine

## 2017-05-15 ENCOUNTER — Telehealth: Payer: Self-pay | Admitting: Cardiology

## 2017-05-15 NOTE — Telephone Encounter (Signed)
Returned the phone call to the patient. She stated that she has been having dyspnea on exertion that is relieved when she is resting. She stated that her weight has not changed and she denies any edema. Her blood pressure this morning was 127/80. She stated that this has been her average and did not have a heart rate record. She would like an appointment in [place] to see Dr. Percival Spanish but there are not any available. Will call back to see if she would be interested in seeing a PA but the first available are next week. Will route to the provider for recommendation.

## 2017-05-15 NOTE — Telephone Encounter (Signed)
Per the patient, a message has been left that an appointment has been made for her at the [facility] location at 9 am with Dr. Percival Spanish. She has been instructed to call back if she needs anything else.

## 2017-05-15 NOTE — Telephone Encounter (Signed)
New message    Pt is calling.   Pt c/o Shortness Of Breath: STAT if SOB developed within the last 24 hours or pt is noticeably SOB on the phone  1. Are you currently SOB (can you hear that pt is SOB on the phone)? No   2. How long have you been experiencing SOB? 2-3 weeks  3. Are you SOB when sitting or when up moving around? Up moving around   4. Are you currently experiencing any other symptoms? Weakness

## 2017-05-15 NOTE — Telephone Encounter (Signed)
She can see me Thursday in [place].

## 2017-05-15 NOTE — Telephone Encounter (Signed)
Left message to cal back, per patient and DPR.

## 2017-05-16 NOTE — Progress Notes (Signed)
Cardiology Office Note   Date:  05/17/2017   ID:  Emily Wagner, DOB [birth year], MRN [id number]  PCP:  Chipper Herb, MD  Cardiologist:   Minus Breeding, MD   Chief Complaint  Patient presents with  . Shortness of Breath      History of Present Illness: Emily Wagner is a 83 y.o. female who presents for follow-up after bypass surgery.   She called recently as she was having increased dyspnea.  This has been a significant complaint. She's had normal CBC, TSH and BNP. She did have coronary disease with a CABG in 2016. She's had an echocardiogram last March that did not suggest any significant pathology. She continues to get fatigued and dyspneic. She says this is happening with mild activities such as pushing a vacuum cleaner. She denies any chest pressure, neck or arm discomfort. She does note that her heart rate goes fast when she is doing activities but she's not otherwise describing palpitations, presyncope or syncope. She's not having any PND or orthopnea.  She has a lot of support following the death of her daughter.  She says she is sleeping well but she sleeps alone and none of her sleep is witnessed.   Past Medical History:  Diagnosis Date  . Adjustment disorder with mixed anxiety and depressed mood   . Atrial fibrillation ([place]) 11/04/2015   Post-operative  . Benign hypertensive heart disease without heart failure   . CAD (coronary artery disease)   . Infection of urinary tract 10/31/2015  . Interstitial cystitis   . Osteopenia   . Other and unspecified hyperlipidemia   . Other dyspnea and respiratory abnormality   . Pelvic fracture ([place]) 05/30/2015  . 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 ABDOMINAL HYSTERECTOMY     Cancer and endometriosis  . CARDIAC CATHETERIZATION  2008  . CARDIAC CATHETERIZATION N/A 10/29/2015   Procedure: Left Heart Cath and Coronary Angiography;  Surgeon: Belva Crome, MD;   Location: [facility];  Service: Cardiovascular;  Laterality: N/A;  . CATARACT EXTRACTION    . CHOLECYSTECTOMY    . CORONARY ARTERY BYPASS GRAFT N/A 11/02/2015   Procedure: OFF PUMP CORONARY ARTERY BYPASS GRAFTING (CABG) TIMES ONE;  Surgeon: Rexene Alberts, MD;  Location: [facility];  Service: Open Heart Surgery;  Laterality: N/A;  LIMA to LAD  . FRACTURE SURGERY     Right hip  . HERNIA REPAIR     Right inguinal  . TEE WITHOUT CARDIOVERSION N/A 11/02/2015   Procedure: TRANSESOPHAGEAL ECHOCARDIOGRAM (TEE);  Surgeon: Rexene Alberts, MD;  Location: [facility];  Service: Open Heart Surgery;  Laterality: N/A;     Current Outpatient Prescriptions  Medication Sig Dispense Refill  . aspirin EC 81 MG tablet Take 81 mg by mouth daily.    . Cholecalciferol (VITAMIN D) 2000 UNITS CAPS Take 2,000 Units by mouth daily.     . fexofenadine (ALLEGRA) 180 MG tablet Take 180 mg by mouth daily.    . hydrochlorothiazide (HYDRODIURIL) 25 MG tablet Take 1 tablet (25 mg total) by mouth daily. 90 tablet 3  . levothyroxine (SYNTHROID, LEVOTHROID) 50 MCG tablet Take 1 tablet (50 mcg total) by mouth daily before breakfast. 90 tablet 3  . LORazepam (ATIVAN) 0.5 MG tablet TAKE ONE TABLET TWICE DAILY AS NEEDED 60 tablet 1  . Multiple Vitamins-Minerals (PRESERVISION AREDS PO) Take 1 tablet  by mouth every other day.     . ranitidine (ZANTAC) 150 MG tablet Take 150 mg by mouth 3 (three) times daily.     No current facility-administered medications for this visit.     Allergies:   Nitrofurantoin; Nsaids; Other; Flonase [fluticasone propionate]; Prevnar 13 [pneumococcal 13-val conj vacc]; Beta adrenergic blockers; Alendronate sodium; Ciprofloxacin; Codeine; Crestor [rosuvastatin calcium]; Darvon; Lipitor [atorvastatin calcium]; Pravachol; Propranolol hcl; Sulfa antibiotics; Sulfamethoxazole; Ultram [tramadol hcl]; Welchol [colesevelam hcl]; Zetia [ezetimibe]; and Zocor [simvastatin]    ROS:  Please see the history of  present illness.   Otherwise, review of systems are positive for none.   All other systems are reviewed and negative.    PHYSICAL EXAM: VS:  BP (!) 146/87   Pulse (!) 103   Ht 5\' 4"  (1.626 m)   Wt 169 lb (76.7 kg)   LMP 03/21/1971   SpO2 96% Comment: after walking 4 minutes  BMI 29.01 kg/m  , BMI Body mass index is 29.01 kg/m.  GENERAL:  Well appearing NECK:  No jugular venous distention, waveform within normal limits, carotid upstroke brisk and symmetric, no bruits, no thyromegaly LUNGS:  Clear to auscultation bilaterally BACK:  No CVA tenderness CHEST:  Well healed sternotomy scar. HEART:  PMI not displaced or sustained,S1 and S2 within normal limits, no S3, no S4, no clicks, no rubs, no murmurs ABD:  Flat, positive bowel sounds normal in frequency in pitch, no bruits, no rebound, no guarding, no midline pulsatile mass, no hepatomegaly, no splenomegaly EXT:  2 plus pulses throughout, no edema, no cyanosis no clubbing   EKG:  EKG is not ordered today.   Recent Labs: 09/29/2016: ALT 10; BUN 20; Creatinine, Ser 1.29; Potassium 4.0; Sodium 141 01/03/2017: BNP 21.6; Hemoglobin 15.1; Platelets 205; TSH 2.370    Lipid Panel    Component Value Date/Time   CHOL 215 (H) 09/29/2016 1006   TRIG 129 09/29/2016 1006   TRIG 162 (H) 06/24/2015 0918   HDL 42 09/29/2016 1006   HDL 44 06/24/2015 0918   CHOLHDL 5.1 (H) 09/29/2016 1006   CHOLHDL 5.4 10/31/2015 0215   VLDL 20 10/31/2015 0215   LDLCALC 147 (H) 09/29/2016 1006   LDLCALC 163 (H) 05/14/2014 1038      Wt Readings from Last 3 Encounters:  05/17/17 169 lb (76.7 kg)  03/02/17 167 lb (75.8 kg)  01/31/17 161 lb (73 kg)      Other studies Reviewed: Additional studies/ records that were reviewed today include:   Labs  Review of the above records demonstrates:       ASSESSMENT AND PLAN:  DYSPNEA:  She did not have any desaturations when walked around the office. She started at 97% and went to 96% though she felt quite  breathless. As below she wants me to check a potassium check a BP med. I will consider pulmonary function testing but she wants to hold off on this for now.  CABG with LIMA-LAD:   The patient has no new symptoms of angina.  No further ischemia work up is planned at this point.   No further cardiovascular testing is indicated.  We will continue with aggressive risk reduction and meds as listed.  FATIGUE:  I cannot find an etiology.  She thinks that it might be low potassium.  I will check this.  It as not been an issue in the past.   She does not want a sleep study.  HYPOTENSION:   She was taken off of lisinopril and  her BP is well controlled.  I reviewed a diary.    ATRIAL DYN:[id number].    Her fibrillation was postop. She has had no recurrence.  No change in therapy is indicated.      Current medicines are reviewed at length with the patient today.  The patient does not have concerns regarding medicines.  The following changes have been made:   none  Labs/ tests ordered today include:      Orders Placed This Encounter  Procedures  . Basic metabolic panel     Disposition:   FU with me in Sept.    Signed, Minus Breeding, MD  05/17/2017 10:01 AM    [place]

## 2017-05-17 ENCOUNTER — Ambulatory Visit (INDEPENDENT_AMBULATORY_CARE_PROVIDER_SITE_OTHER): Payer: Medicare Other | Admitting: Cardiology

## 2017-05-17 ENCOUNTER — Other Ambulatory Visit: Payer: Medicare Other

## 2017-05-17 ENCOUNTER — Encounter: Payer: Self-pay | Admitting: Cardiology

## 2017-05-17 ENCOUNTER — Telehealth: Payer: Self-pay | Admitting: Cardiology

## 2017-05-17 VITALS — BP 146/87 | HR 103 | Ht 64.0 in | Wt 169.0 lb

## 2017-05-17 DIAGNOSIS — I10 Essential (primary) hypertension: Secondary | ICD-10-CM

## 2017-05-17 DIAGNOSIS — R5383 Other fatigue: Secondary | ICD-10-CM | POA: Diagnosis not present

## 2017-05-17 DIAGNOSIS — R0602 Shortness of breath: Secondary | ICD-10-CM | POA: Diagnosis not present

## 2017-05-17 DIAGNOSIS — I2510 Atherosclerotic heart disease of native coronary artery without angina pectoris: Secondary | ICD-10-CM

## 2017-05-17 NOTE — Addendum Note (Signed)
Addended by: Waylan Rocher on: 05/17/2017 10:51 AM   Modules accepted: Orders

## 2017-05-17 NOTE — Patient Instructions (Signed)
Medication Instructions:  Continue all current medications.  Labwork:  BMET - order given today.    Office will contact with results via phone or letter.    Testing/Procedures: none  Follow-Up: 3 months   Any Other Special Instructions Will Be Listed Below (If Applicable).  If you need a refill on your cardiac medications before your next appointment, please call your pharmacy.  

## 2017-05-17 NOTE — Telephone Encounter (Signed)
S/w Tesha @ [facility] she states that she has [facility] lab order and needs a [facility] order requisition.  Lab order changed, lab notified

## 2017-05-18 LAB — BASIC METABOLIC PANEL
BUN/Creatinine Ratio: 21 (ref 12–28)
BUN: 29 mg/dL — ABNORMAL HIGH (ref 8–27)
CALCIUM: 9.6 mg/dL (ref 8.7–10.3)
CHLORIDE: 100 mmol/L (ref 96–106)
CO2: 22 mmol/L (ref 20–29)
Creatinine, Ser: 1.41 mg/dL — ABNORMAL HIGH (ref 0.57–1.00)
GFR calc non Af Amer: 35 mL/min/{1.73_m2} — ABNORMAL LOW (ref >59)
GFR, EST AFRICAN AMERICAN: 40 mL/min/{1.73_m2} — AB (ref >59)
Glucose: 98 mg/dL (ref 65–99)
POTASSIUM: 4.1 mmol/L (ref 3.5–5.2)
Sodium: 140 mmol/L (ref 134–144)

## 2017-05-21 ENCOUNTER — Telehealth: Payer: Self-pay | Admitting: Family Medicine

## 2017-05-21 ENCOUNTER — Telehealth: Payer: Self-pay

## 2017-05-21 ENCOUNTER — Telehealth: Payer: Self-pay | Admitting: Cardiology

## 2017-05-21 DIAGNOSIS — N289 Disorder of kidney and ureter, unspecified: Secondary | ICD-10-CM

## 2017-05-21 NOTE — Telephone Encounter (Signed)
Follow Up:   Pt would like her lab results from last Thursday please.

## 2017-05-21 NOTE — Telephone Encounter (Signed)
Patient notified. Routed to PCP 

## 2017-05-21 NOTE — Telephone Encounter (Signed)
-----   Message from Minus Breeding, MD sent at 05/19/2017  7:39 PM EDT ----- Potassium is normal.   However, creat is up.  She should get a repeat BMET in two weeks.

## 2017-05-21 NOTE — Telephone Encounter (Signed)
Pt is going to stop taking tylenol arthritis until she repeats her labs this is the only thing she has changed that she thinks may have caused the change in her creatinine

## 2017-05-23 ENCOUNTER — Other Ambulatory Visit: Payer: Self-pay | Admitting: *Deleted

## 2017-05-23 DIAGNOSIS — R7989 Other specified abnormal findings of blood chemistry: Secondary | ICD-10-CM

## 2017-05-31 ENCOUNTER — Other Ambulatory Visit: Payer: Medicare Other

## 2017-05-31 ENCOUNTER — Other Ambulatory Visit: Payer: Self-pay | Admitting: *Deleted

## 2017-05-31 DIAGNOSIS — N289 Disorder of kidney and ureter, unspecified: Secondary | ICD-10-CM

## 2017-05-31 DIAGNOSIS — I10 Essential (primary) hypertension: Secondary | ICD-10-CM | POA: Diagnosis not present

## 2017-05-31 LAB — BASIC METABOLIC PANEL
BUN/Creatinine Ratio: 12 (ref 12–28)
BUN: 14 mg/dL (ref 8–27)
CALCIUM: 9.7 mg/dL (ref 8.7–10.3)
CHLORIDE: 99 mmol/L (ref 96–106)
CO2: 24 mmol/L (ref 20–29)
CREATININE: 1.2 mg/dL — AB (ref 0.57–1.00)
GFR calc Af Amer: 49 mL/min/{1.73_m2} — ABNORMAL LOW (ref >59)
GFR calc non Af Amer: 42 mL/min/{1.73_m2} — ABNORMAL LOW (ref >59)
GLUCOSE: 85 mg/dL (ref 65–99)
Potassium: 4.3 mmol/L (ref 3.5–5.2)
Sodium: 140 mmol/L (ref 134–144)

## 2017-06-05 ENCOUNTER — Encounter: Payer: Self-pay | Admitting: *Deleted

## 2017-06-19 DIAGNOSIS — M9905 Segmental and somatic dysfunction of pelvic region: Secondary | ICD-10-CM | POA: Diagnosis not present

## 2017-06-19 DIAGNOSIS — M9903 Segmental and somatic dysfunction of lumbar region: Secondary | ICD-10-CM | POA: Diagnosis not present

## 2017-06-19 DIAGNOSIS — M5430 Sciatica, unspecified side: Secondary | ICD-10-CM | POA: Diagnosis not present

## 2017-06-19 DIAGNOSIS — M9904 Segmental and somatic dysfunction of sacral region: Secondary | ICD-10-CM | POA: Diagnosis not present

## 2017-06-22 ENCOUNTER — Other Ambulatory Visit: Payer: Self-pay | Admitting: Family Medicine

## 2017-06-25 NOTE — Telephone Encounter (Signed)
Last filled 05/20/17, last seen 03/02/17. Call in

## 2017-07-10 ENCOUNTER — Other Ambulatory Visit (HOSPITAL_COMMUNITY): Payer: Self-pay | Admitting: Podiatry

## 2017-07-10 DIAGNOSIS — IMO0002 Reserved for concepts with insufficient information to code with codable children: Secondary | ICD-10-CM

## 2017-07-10 DIAGNOSIS — R229 Localized swelling, mass and lump, unspecified: Principal | ICD-10-CM

## 2017-07-10 DIAGNOSIS — M67472 Ganglion, left ankle and foot: Secondary | ICD-10-CM | POA: Diagnosis not present

## 2017-07-19 ENCOUNTER — Ambulatory Visit (HOSPITAL_COMMUNITY)
Admission: RE | Admit: 2017-07-19 | Discharge: 2017-07-19 | Disposition: A | Discharge status: Home / Self Care | Payer: Medicare Other | Source: Ambulatory Visit | Attending: Podiatry | Admitting: Podiatry

## 2017-07-19 ENCOUNTER — Other Ambulatory Visit (HOSPITAL_COMMUNITY): Payer: Self-pay | Admitting: Podiatry

## 2017-07-19 DIAGNOSIS — R229 Localized swelling, mass and lump, unspecified: Principal | ICD-10-CM

## 2017-07-19 DIAGNOSIS — R2242 Localized swelling, mass and lump, left lower limb: Secondary | ICD-10-CM | POA: Diagnosis not present

## 2017-07-19 DIAGNOSIS — IMO0002 Reserved for concepts with insufficient information to code with codable children: Secondary | ICD-10-CM

## 2017-07-19 DIAGNOSIS — M19072 Primary osteoarthritis, left ankle and foot: Secondary | ICD-10-CM | POA: Diagnosis not present

## 2017-07-19 LAB — POCT I-STAT CREATININE: Creatinine, Ser: 1.3 mg/dL — ABNORMAL HIGH (ref 0.44–1.00)

## 2017-07-23 ENCOUNTER — Ambulatory Visit: Payer: Medicare Other | Admitting: Family Medicine

## 2017-07-25 ENCOUNTER — Encounter: Payer: Self-pay | Admitting: Family Medicine

## 2017-07-25 ENCOUNTER — Ambulatory Visit (INDEPENDENT_AMBULATORY_CARE_PROVIDER_SITE_OTHER): Payer: Medicare Other | Admitting: Family Medicine

## 2017-07-25 VITALS — BP 146/90 | HR 81 | Temp 96.8°F | Ht 64.0 in | Wt 169.0 lb

## 2017-07-25 DIAGNOSIS — I10 Essential (primary) hypertension: Secondary | ICD-10-CM

## 2017-07-25 DIAGNOSIS — W57XXXA Bitten or stung by nonvenomous insect and other nonvenomous arthropods, initial encounter: Secondary | ICD-10-CM

## 2017-07-25 DIAGNOSIS — E559 Vitamin D deficiency, unspecified: Secondary | ICD-10-CM

## 2017-07-25 DIAGNOSIS — I480 Paroxysmal atrial fibrillation: Secondary | ICD-10-CM

## 2017-07-25 DIAGNOSIS — I2510 Atherosclerotic heart disease of native coronary artery without angina pectoris: Secondary | ICD-10-CM

## 2017-07-25 DIAGNOSIS — E7800 Pure hypercholesterolemia, unspecified: Secondary | ICD-10-CM | POA: Diagnosis not present

## 2017-07-25 DIAGNOSIS — K219 Gastro-esophageal reflux disease without esophagitis: Secondary | ICD-10-CM

## 2017-07-25 DIAGNOSIS — L570 Actinic keratosis: Secondary | ICD-10-CM

## 2017-07-25 DIAGNOSIS — R5383 Other fatigue: Secondary | ICD-10-CM | POA: Diagnosis not present

## 2017-07-25 MED ORDER — LORAZEPAM 0.5 MG PO TABS: ORAL_TABLET | ORAL | 4 refills | Status: DC

## 2017-07-25 MED ORDER — DOXYCYCLINE HYCLATE 100 MG PO TABS: 100.0000 mg | ORAL_TABLET | Freq: Two times a day (BID) | ORAL | 0 refills | Status: DC

## 2017-07-25 NOTE — Progress Notes (Signed)
Subjective:    Patient ID: Emily Wagner, female    DOB: November 22, 1933, 83 y.o.   MRN: [id number]  HPI Pt here for follow up and management of chronic medical problems which includes hyperlipidemia, gerd, and a fib. She is taking medication regularly.The patient today is concerned about 2 skin lesions. She also complains of some fatigue and is worried about her B12 level. She also complains of 2 or 3 tick bites. She will get lab work today. She is followed regularly by the cardiologist and has lots of allergies to lots of different medicines. She does continue to take lorazepam. Her home blood pressure readings were brought in for review and blood pressures at home were good and he will be no change in treatment. These will be scanned into the record. The patient has had recent tick bites after going to the [place] with some flowers and being in a grassy area. She denies any chest pain or shortness of breath. She still sees a cardiologist on a regular basis because of her bypass surgery. She denies any trouble with her stomach including nausea vomiting diarrhea or blood in the stool. She is passing her water without problems. We discussed the treatment with doxycycline and she understands clearly that she will have to take this twice daily with food and that it can upset the stomach if she takes it on an empty stomach.      Patient Active Problem List   Diagnosis Date Noted  . SOB (shortness of breath) 05/17/2017  . Long term (current) use of anticoagulants 11/11/2015  . Atrial fibrillation ([place]) 11/04/2015  . 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 Infection of urinary tract 10/31/2015  . Unstable angina ([place]) 10/29/2015  . CAD (coronary artery disease) 10/29/2015  . Benign hypertensive heart disease without heart failure   . Interstitial cystitis   . Osteopenia   . Pelvic fracture ([place]) 05/30/2015  . Allergy to multiple medications 07/15/2013  . Renal insufficiency   . Adjustment disorder  with mixed anxiety and depressed mood   . Osteoporosis   . ASCVD (arteriosclerotic cardiovascular disease)   . 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  . HYPERCHOLESTEROLEMIA 05/02/2008  . Essential hypertension 05/02/2008  . INTERSTITIAL CYSTITIS 05/02/2008  . DIVERTICULOSIS OF COLON 08/27/2003   Outpatient Encounter Prescriptions as of 07/25/2017  Medication Sig  . aspirin EC 81 MG tablet Take 81 mg by mouth daily.  . Cholecalciferol (VITAMIN D) 2000 UNITS CAPS Take 2,000 Units by mouth daily.   . fexofenadine (ALLEGRA) 180 MG tablet Take 180 mg by mouth daily.  . hydrochlorothiazide (HYDRODIURIL) 25 MG tablet Take 1 tablet (25 mg total) by mouth daily.  [place] levothyroxine (SYNTHROID, LEVOTHROID) 50 MCG tablet Take 1 tablet (50 mcg total) by mouth daily before breakfast.  . LORazepam (ATIVAN) 0.5 MG tablet TAKE 1 TABLET BY MOUTH 2 TIMES DAILY AS NEEDED  . Multiple Vitamins-Minerals (PRESERVISION AREDS PO) Take 1 tablet by mouth every other day.   . ranitidine (ZANTAC) 150 MG tablet Take 150 mg by mouth 3 (three) times daily.   No facility-administered encounter medications on file as of 07/25/2017.      Review of Systems  Constitutional: Positive for fatigue.  HENT: Negative.   Eyes: Negative.   Respiratory: Negative.   Cardiovascular: Negative.   Gastrointestinal: Negative.   Endocrine: Negative.   Genitourinary: Negative.   Musculoskeletal: Negative.   Skin: Negative.   Allergic/Immunologic: Negative.   Neurological: Negative.   Hematological: Negative.  Psychiatric/Behavioral: Negative.        Objective:   Physical Exam  Constitutional: She is oriented to person, place, and time. She appears well-developed and well-nourished. No distress.  Elderly and calm and adjusting to the loss of her daughter.  HENT:  Head: Normocephalic and atraumatic.  Right Ear: External ear normal.  Left Ear: External ear normal.  Nose: Nose normal.  Mouth/Throat: Oropharynx is clear and moist. No  oropharyngeal exudate.  Eyes: Pupils are equal, round, and reactive to light. Conjunctivae and EOM are normal. Right eye exhibits no discharge. Left eye exhibits no discharge. No scleral icterus.  Neck: Normal range of motion. Neck supple. No thyromegaly present.  No bruits thyromegaly or anterior cervical adenopathy  Cardiovascular: Normal rate, regular rhythm, normal heart sounds and intact distal pulses.   No murmur heard. Heart has a regular rate and rhythm at 72/m with good pedal pulses.  Pulmonary/Chest: Effort normal and breath sounds normal. No respiratory distress. She has no wheezes. She has no rales.  Clear anteriorly and posteriorly  Abdominal: Soft. Bowel sounds are normal. She exhibits no mass. There is no tenderness. There is no rebound and no guarding.  The abdomen is soft without masses tenderness or organ enlargement or bruits  Musculoskeletal: Normal range of motion. She exhibits no edema or tenderness.  Lymphadenopathy:    She has no cervical adenopathy.  Neurological: She is alert and oriented to person, place, and time. She has normal reflexes. No cranial nerve deficit.  Skin: Skin is warm and dry. No rash noted.  AK left for head hairline. Skin lesion not healing right lower leg and patient will make an appointment with her dermatologist Dr. Nevada Crane for this. Patient also has 3 tick bite sites one on each leg and one on the right forearm where she removed dear ticks. She also has one little area of poison ivy or contact dermatitis below the tick bite on the right foot.  Psychiatric: She has a normal mood and affect. Her behavior is normal. Judgment and thought content normal.  Remains somewhat anxious regarding the loss of her daughter which would be expected.  Nursing note and vitals reviewed.  BP (!) 146/90 (BP Location: Left Arm)   Pulse 81   Temp (!) 96.8 F (36 C) (Oral)   Ht 5\' 4"  (1.626 m)   Wt 169 lb (76.7 kg)   LMP 03/21/1971   BMI 29.01 kg/m           Assessment & Plan:  1. Essential hypertension -Blood pressure is slightly elevated today. Home readings are good and I will be scanned into the record and no changes will be made regarding her blood pressure. - Hepatic function panel  2. Gastroesophageal reflux disease, esophagitis presence not specified -Continue with ranitidine and diet of avoidance - Hepatic function panel  3. ASCVD (arteriosclerotic cardiovascular disease) -Continue follow-up with cardiology every 6 months  4. Paroxysmal atrial fibrillation (HCC) -The heart had a regular rate and rhythm today at 72/m and she will continue to follow-up with cardiology  5. Vitamin D deficiency -Continue current treatment pending results of lab work - VITAMIN D 25 Hydroxy (Vit-D Deficiency, Fractures)  6. HYPERCHOLESTEROLEMIA -Patient is statin intolerant and she will continue with as aggressive therapeutic lifestyle changes as possible which include diet and exercise  7. Tick bite, initial encounter -Doxycycline 100 twice daily with food - Lyme Ab/Western Blot Reflex - Rocky mtn spotted fvr abs pnl(IgG+IgM)  8. Other fatigue - Vitamin B12 -  Thyroid Panel With TSH  9. Actinic keratosis -Cryotherapy to AK on left for head at hairline. Patient tolerated procedure well. If this does not resolve she understands that she will need to see the dermatologist. She will schedule to see the dermatologist about a lesion on her leg.  Meds ordered this encounter  Medications  . LORazepam (ATIVAN) 0.5 MG tablet    Sig: TAKE 1 TABLET BY MOUTH 2 TIMES DAILY AS NEEDED    Dispense:  60 tablet    Refill:  4  . doxycycline (VIBRA-TABS) 100 MG tablet    Sig: Take 1 tablet (100 mg total) by mouth 2 (two) times daily. 1 po bid    Dispense:  42 tablet    Refill:  0   Patient Instructions                       Medicare Annual Wellness Visit  [facility] and the medical providers at [facility] strive to bring you the  best medical care.  In doing so we not only want to address your current medical conditions and concerns but also to detect new conditions early and prevent illness, disease and health-related problems.    Medicare offers a yearly Wellness Visit which allows our clinical staff to assess your need for preventative services including immunizations, lifestyle education, counseling to decrease risk of preventable diseases and screening for fall risk and other medical concerns.    This visit is provided free of charge (no copay) for all Medicare recipients. The clinical pharmacists at [facility] have begun to conduct these Wellness Visits which will also include a thorough review of all your medications.    As you primary medical provider recommend that you make an appointment for your Annual Wellness Visit if you have not done so already this year.  You may set up this appointment before you leave today or you may call back ([phone]) and schedule an appointment.  Please make sure when you call that you mention that you are scheduling your Annual Wellness Visit with the clinical pharmacist so that the appointment may be made for the proper length of time.     Continue current medications. Continue good therapeutic lifestyle changes which include good diet and exercise. Fall precautions discussed with patient. If an FOBT was given today- please return it to our front desk. If you are over 53 years old - you may need Prevnar 4 or the adult Pneumonia vaccine.  **Flu shots are available--- please call and schedule a FLU-CLINIC appointment**  After your visit with [name] today you will receive a survey in the mail or online from [employer] regarding your care with [name]. Please take a moment to fill this out. Your feedback is very important to [name] as you can help [name] better understand your patient needs as well as improve your experience and satisfaction. WE CARE ABOUT YOU!!!     Arrie Senate MD

## 2017-07-25 NOTE — Patient Instructions (Signed)
Medicare Annual Wellness Visit  [facility] and the medical providers at [facility] strive to bring you the best medical care.  In doing so we not only want to address your current medical conditions and concerns but also to detect new conditions early and prevent illness, disease and health-related problems.    Medicare offers a yearly Wellness Visit which allows our clinical staff to assess your need for preventative services including immunizations, lifestyle education, counseling to decrease risk of preventable diseases and screening for fall risk and other medical concerns.    This visit is provided free of charge (no copay) for all Medicare recipients. The clinical pharmacists at [facility] have begun to conduct these Wellness Visits which will also include a thorough review of all your medications.    As you primary medical provider recommend that you make an appointment for your Annual Wellness Visit if you have not done so already this year.  You may set up this appointment before you leave today or you may call back ([phone]) and schedule an appointment.  Please make sure when you call that you mention that you are scheduling your Annual Wellness Visit with the clinical pharmacist so that the appointment may be made for the proper length of time.     Continue current medications. Continue good therapeutic lifestyle changes which include good diet and exercise. Fall precautions discussed with patient. If an FOBT was given today- please return it to our front desk. If you are over 50 years old - you may need Prevnar 13 or the adult Pneumonia vaccine.  **Flu shots are available--- please call and schedule a FLU-CLINIC appointment**  After your visit with us today you will receive a survey in the mail or online from [employer] regarding your care with us. Please take a moment to fill this out. Your feedback is very  important to us as you can help us better understand your patient needs as well as improve your experience and satisfaction. WE CARE ABOUT YOU!!!    

## 2017-07-27 LAB — HEPATIC FUNCTION PANEL
ALT: 9 IU/L (ref 0–32)
AST: 19 IU/L (ref 0–40)
Albumin: 4.3 g/dL (ref 3.5–4.7)
Alkaline Phosphatase: 84 IU/L (ref 39–117)
BILIRUBIN TOTAL: 0.5 mg/dL (ref 0.0–1.2)
BILIRUBIN, DIRECT: 0.16 mg/dL (ref 0.00–0.40)
Total Protein: 7.1 g/dL (ref 6.0–8.5)

## 2017-07-27 LAB — LYME, WESTERN BLOT, SERUM (REFLEXED)
IGG P23 AB.: ABSENT
IGG P30 AB.: ABSENT
IGG P39 AB.: ABSENT
IGG P66 AB.: ABSENT
IGG P93 AB.: ABSENT
IGM P23 AB.: ABSENT
IGM P39 AB.: ABSENT
IGM P41 AB.: ABSENT
IgG P18 Ab.: ABSENT
IgG P28 Ab.: ABSENT
IgG P41 Ab.: ABSENT
IgG P45 Ab.: ABSENT
IgG P58 Ab.: ABSENT
Lyme IgG Wb: NEGATIVE
Lyme IgM Wb: NEGATIVE

## 2017-07-27 LAB — ROCKY MTN SPOTTED FVR ABS PNL(IGG+IGM)
RMSF IGM: 1.37 {index} — AB (ref 0.00–0.89)
RMSF IgG: NEGATIVE

## 2017-07-27 LAB — THYROID PANEL WITH TSH
FREE THYROXINE INDEX: 2.4 (ref 1.2–4.9)
T3 Uptake Ratio: 32 % (ref 24–39)
T4 TOTAL: 7.6 ug/dL (ref 4.5–12.0)
TSH: 1.27 u[IU]/mL (ref 0.450–4.500)

## 2017-07-27 LAB — VITAMIN B12: Vitamin B-12: 244 pg/mL (ref 232–1245)

## 2017-07-27 LAB — LYME AB/WESTERN BLOT REFLEX: LYME DISEASE AB, QUANT, IGM: 1.01 index — ABNORMAL HIGH (ref 0.00–0.79)

## 2017-07-27 LAB — VITAMIN D 25 HYDROXY (VIT D DEFICIENCY, FRACTURES): Vit D, 25-Hydroxy: 47.8 ng/mL (ref 30.0–100.0)

## 2017-08-02 DIAGNOSIS — H353131 Nonexudative age-related macular degeneration, bilateral, early dry stage: Secondary | ICD-10-CM | POA: Diagnosis not present

## 2017-08-02 DIAGNOSIS — H04123 Dry eye syndrome of bilateral lacrimal glands: Secondary | ICD-10-CM | POA: Diagnosis not present

## 2017-08-02 DIAGNOSIS — H10413 Chronic giant papillary conjunctivitis, bilateral: Secondary | ICD-10-CM | POA: Diagnosis not present

## 2017-08-02 DIAGNOSIS — Z961 Presence of intraocular lens: Secondary | ICD-10-CM | POA: Diagnosis not present

## 2017-08-13 ENCOUNTER — Other Ambulatory Visit: Payer: Self-pay | Admitting: Family Medicine

## 2017-08-14 NOTE — Progress Notes (Signed)
Cardiology Office Note   Date:  08/15/2017   ID:  Emily Wagner, DOB 30-Aug-1933, MRN [id number]  PCP:  Chipper Herb, MD  Cardiologist:   Minus Breeding, MD   Chief Complaint  Patient presents with  . Shortness of Breath      History of Present Illness: Emily Wagner is a 83 y.o. female who presents for follow-up after bypass surgery.   I saw her in June to evaluate increased dyspnea.  She had subjective dyspnea in the office without normal oxygen saturations.  She did not want PFTs.  Following this she did start to get better.  She had less dyspnea and she was doing some cardiovascular activity at the [facility]  She had improved fatigue.  And then...she contracted RMSF.  She has had fatigue and a decline in functional status.  However, her breathing is still better.  The patient denies any new symptoms such as chest discomfort, neck or arm discomfort. There has been no new shortness of breath, PND or orthopnea. There have been no reported palpitations, presyncope or syncope.   Past Medical History:  Diagnosis Date  . Adjustment disorder with mixed anxiety and depressed mood   . Atrial fibrillation ([place]) 11/04/2015   Post-operative  . Benign hypertensive heart disease without heart failure   . CAD (coronary artery disease)   . Infection of urinary tract 10/31/2015  . Interstitial cystitis   . Osteopenia   . Other and unspecified hyperlipidemia   . Other dyspnea and respiratory abnormality   . Pelvic fracture ([place]) 05/30/2015  . 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 ABDOMINAL HYSTERECTOMY     Cancer and endometriosis  . CARDIAC CATHETERIZATION  2008  . CARDIAC CATHETERIZATION N/A 10/29/2015   Procedure: Left Heart Cath and Coronary Angiography;  Surgeon: Belva Crome, MD;  Location: [facility];  Service: Cardiovascular;  Laterality: N/A;  . CATARACT EXTRACTION    . CHOLECYSTECTOMY    . CORONARY  ARTERY BYPASS GRAFT N/A 11/02/2015   Procedure: OFF PUMP CORONARY ARTERY BYPASS GRAFTING (CABG) TIMES ONE;  Surgeon: Rexene Alberts, MD;  Location: [facility];  Service: Open Heart Surgery;  Laterality: N/A;  LIMA to LAD  . FRACTURE SURGERY     Right hip  . HERNIA REPAIR     Right inguinal  . TEE WITHOUT CARDIOVERSION N/A 11/02/2015   Procedure: TRANSESOPHAGEAL ECHOCARDIOGRAM (TEE);  Surgeon: Rexene Alberts, MD;  Location: [facility];  Service: Open Heart Surgery;  Laterality: N/A;     Current Outpatient Prescriptions  Medication Sig Dispense Refill  . aspirin EC 81 MG tablet Take 81 mg by mouth daily.    . Cholecalciferol (VITAMIN D) 2000 UNITS CAPS Take 2,000 Units by mouth daily.     . Cyanocobalamin (VITAMIN B-12 PO) Take 5,000 mcg by mouth every morning.    Marland Kitchen doxycycline (VIBRA-TABS) 100 MG tablet TAKE 1 TABLET (100 MG TOTAL) BY MOUTH 2 (TWO) TIMES DAILY. 42 tablet 0  . fexofenadine (ALLEGRA) 180 MG tablet Take 180 mg by mouth daily.    . hydrochlorothiazide (HYDRODIURIL) 25 MG tablet Take 1 tablet (25 mg total) by mouth daily. 90 tablet 3  . levothyroxine (SYNTHROID, LEVOTHROID) 50 MCG tablet Take 1 tablet (50 mcg total) by mouth daily before breakfast. 90 tablet 3  . LORazepam (ATIVAN) 0.5 MG tablet TAKE 1 TABLET BY MOUTH 2  TIMES DAILY AS NEEDED 60 tablet 4  . Multiple Vitamins-Minerals (PRESERVISION AREDS PO) Take 1 tablet by mouth every other day.     . ranitidine (ZANTAC) 150 MG tablet Take 150 mg by mouth 3 (three) times daily.     No current facility-administered medications for this visit.     Allergies:   Nitrofurantoin; Nsaids; Other; Flonase [fluticasone propionate]; Prevnar 13 [pneumococcal 13-val conj vacc]; Beta adrenergic blockers; Alendronate sodium; Ciprofloxacin; Codeine; Crestor [rosuvastatin calcium]; Darvon; Lipitor [atorvastatin calcium]; Pravachol; Propranolol hcl; Sulfa antibiotics; Sulfamethoxazole; Ultram [tramadol hcl]; Welchol [colesevelam hcl]; Zetia [ezetimibe];  and Zocor [simvastatin]    ROS:  Please see the history of present illness.   Otherwise, review of systems are positive for yeast infection related to her antibitiocs.   All other systems are reviewed and negative.    PHYSICAL EXAM: VS:  BP 140/90   Pulse 80   Ht 5\' 4"  (1.626 m)   Wt 171 lb (77.6 kg)   LMP 03/21/1971   BMI 29.35 kg/m  , BMI Body mass index is 29.35 kg/m.  GENERAL:  Well appearing NECK:  No jugular venous distention, waveform within normal limits, carotid upstroke brisk and symmetric, no bruits, no thyromegaly LUNGS:  Clear to auscultation bilaterally CHEST:  Unremarkable HEART:  PMI not displaced or sustained,S1 and S2 within normal limits, no S3, no S4, no clicks, no rubs, no murmurs ABD:  Flat, positive bowel sounds normal in frequency in pitch, no bruits, no rebound, no guarding, no midline pulsatile mass, no hepatomegaly, no splenomegaly EXT:  2 plus pulses throughout, no edema, no cyanosis no clubbing   EKG:  EKG is ordered today. Normal sinus rhythm, rate 80, axis within normal limits, intervals within normal limits, poor anterior R wave progression, no acute ST-T wave changes.  Recent Labs: 01/03/2017: BNP 21.6; Hemoglobin 15.1; Platelets 205 05/31/2017: BUN 14; Potassium 4.3; Sodium 140 07/19/2017: Creatinine, Ser 1.30 07/25/2017: ALT 9; TSH 1.270    Lipid Panel    Component Value Date/Time   CHOL 215 (H) 09/29/2016 1006   TRIG 129 09/29/2016 1006   TRIG 162 (H) 06/24/2015 0918   HDL 42 09/29/2016 1006   HDL 44 06/24/2015 0918   CHOLHDL 5.1 (H) 09/29/2016 1006   CHOLHDL 5.4 10/31/2015 0215   VLDL 20 10/31/2015 0215   LDLCALC 147 (H) 09/29/2016 1006   LDLCALC 163 (H) 05/14/2014 1038      Wt Readings from Last 3 Encounters:  08/15/17 171 lb (77.6 kg)  07/25/17 169 lb (76.7 kg)  05/17/17 169 lb (76.7 kg)      Other studies Reviewed: Additional studies/ records that were reviewed today include:   None Review of the above records demonstrates:        ASSESSMENT AND PLAN:  DYSPNEA:  This is improved. No change in therapy or other testing is indicated at this point.   CABG with LIMA-LAD:   The patient has no new sypmtoms.  No further testing or change in therapy is indicated.   FATIGUE:   This is multifactorial and likely related in part to RMSF and grieving still with the loss of her daughter.   HYPOTENSION:   BP diary demonstrates very normal readings.  No change in therapy.   ATRIAL WCB:[id number]=1.     This was post op and she has had no recurrence.     Current medicines are reviewed at length with the patient today.  The patient does not have concerns regarding medicines.  The following changes have  been made:   None  Labs/ tests ordered today include:    None  Orders Placed This Encounter  Procedures  . EKG 12-Lead     Disposition:   FU with me in 12 months.    Signed, Minus Breeding, MD  08/15/2017 9:54 AM    [facility]

## 2017-08-15 ENCOUNTER — Encounter: Payer: Self-pay | Admitting: Cardiology

## 2017-08-15 ENCOUNTER — Telehealth: Payer: Self-pay | Admitting: Family Medicine

## 2017-08-15 ENCOUNTER — Ambulatory Visit (INDEPENDENT_AMBULATORY_CARE_PROVIDER_SITE_OTHER): Payer: Medicare Other | Admitting: Cardiology

## 2017-08-15 VITALS — BP 140/90 | HR 80 | Ht 64.0 in | Wt 171.0 lb

## 2017-08-15 DIAGNOSIS — I2510 Atherosclerotic heart disease of native coronary artery without angina pectoris: Secondary | ICD-10-CM

## 2017-08-15 DIAGNOSIS — R0602 Shortness of breath: Secondary | ICD-10-CM

## 2017-08-15 DIAGNOSIS — I10 Essential (primary) hypertension: Secondary | ICD-10-CM

## 2017-08-15 NOTE — Telephone Encounter (Signed)
Please advise- I do not see why there was a refill on her doxy sent in.

## 2017-08-15 NOTE — Patient Instructions (Signed)

## 2017-08-15 NOTE — Telephone Encounter (Signed)
Pt aware - no need for doxy refill .

## 2017-08-17 ENCOUNTER — Telehealth: Payer: Self-pay | Admitting: Family Medicine

## 2017-08-17 NOTE — Telephone Encounter (Signed)
Pt aware - get in the next 2 week

## 2017-09-10 DIAGNOSIS — X32XXXD Exposure to sunlight, subsequent encounter: Secondary | ICD-10-CM | POA: Diagnosis not present

## 2017-09-10 DIAGNOSIS — C44319 Basal cell carcinoma of skin of other parts of face: Secondary | ICD-10-CM | POA: Diagnosis not present

## 2017-09-10 DIAGNOSIS — L570 Actinic keratosis: Secondary | ICD-10-CM | POA: Diagnosis not present

## 2017-09-10 DIAGNOSIS — D0472 Carcinoma in situ of skin of left lower limb, including hip: Secondary | ICD-10-CM | POA: Diagnosis not present

## 2017-09-17 ENCOUNTER — Telehealth: Payer: Self-pay | Admitting: Family Medicine

## 2017-09-17 ENCOUNTER — Encounter: Payer: Self-pay | Admitting: Family Medicine

## 2017-09-17 ENCOUNTER — Ambulatory Visit (INDEPENDENT_AMBULATORY_CARE_PROVIDER_SITE_OTHER): Payer: Medicare Other | Admitting: Family Medicine

## 2017-09-17 VITALS — BP 165/102 | HR 86 | Temp 96.4°F | Ht 64.0 in | Wt 171.0 lb

## 2017-09-17 DIAGNOSIS — R51 Headache: Secondary | ICD-10-CM

## 2017-09-17 DIAGNOSIS — I10 Essential (primary) hypertension: Secondary | ICD-10-CM

## 2017-09-17 DIAGNOSIS — R519 Headache, unspecified: Secondary | ICD-10-CM

## 2017-09-17 DIAGNOSIS — R42 Dizziness and giddiness: Secondary | ICD-10-CM

## 2017-09-17 MED ORDER — AMLODIPINE BESYLATE 5 MG PO TABS: 5.0000 mg | ORAL_TABLET | Freq: Every day | ORAL | 0 refills | Status: DC

## 2017-09-17 MED ORDER — LEVOTHYROXINE SODIUM 50 MCG PO TABS: 50.0000 ug | ORAL_TABLET | Freq: Every day | ORAL | 3 refills | Status: DC

## 2017-09-17 NOTE — Progress Notes (Signed)
Subjective:    Patient ID: Emily Wagner, female    DOB: [birth year], 83 y.o.   MRN: [id number]  HPI Patient here today for elevated BP the last few days. She has had some headaches and some light-headedness as well.  The patient brings in outside blood pressures for review and they have been running too high recently for both the systolic and the diastolic.  They have been running in the range from 136 up to the 342A with the diastolic being as low as 88 but the majority being as high as 110-125.  She is on so had some lightheadedness and dizziness.  She is currently taking HCTZ 25 mg 1 daily.  The patient's last creatinine was 1.30.  She is always had some problems with slight elevation of the creatinine.  On her last visit to the cardiologist which was in September her blood pressure was 140/90.  Patient denies any chest pain or shortness of breath.  She denies any trouble with her stomach including nausea vomiting diarrhea or blood in the stool when she problems.     Patient Active Problem List   Diagnosis Date Noted  . SOB (shortness of breath) 05/17/2017  . Long term (current) use of anticoagulants 11/11/2015  . Atrial fibrillation ([place]) 11/04/2015  . 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 Infection of urinary tract 10/31/2015  . Unstable angina ([place]) 10/29/2015  . CAD (coronary artery disease) 10/29/2015  . Benign hypertensive heart disease without heart failure   . Interstitial cystitis   . Osteopenia   . Pelvic fracture ([place]) 05/30/2015  . Allergy to multiple medications 07/15/2013  . Renal insufficiency   . Adjustment disorder with mixed anxiety and depressed mood   . Osteoporosis   . ASCVD (arteriosclerotic cardiovascular disease)   . 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  . HYPERCHOLESTEROLEMIA 05/02/2008  . Essential hypertension 05/02/2008  . INTERSTITIAL CYSTITIS 05/02/2008  . DIVERTICULOSIS OF COLON 08/27/2003   Outpatient Encounter Prescriptions as of 09/17/2017    Medication Sig  . aspirin EC 81 MG tablet Take 81 mg by mouth daily.  . Cholecalciferol (VITAMIN D) 2000 UNITS CAPS Take 2,000 Units by mouth daily.   . Cyanocobalamin (VITAMIN B-12 PO) Take 5,000 mcg by mouth every morning.  Marland Kitchen doxycycline (VIBRA-TABS) 100 MG tablet TAKE 1 TABLET (100 MG TOTAL) BY MOUTH 2 (TWO) TIMES DAILY.  . fexofenadine (ALLEGRA) 180 MG tablet Take 180 mg by mouth daily.  . hydrochlorothiazide (HYDRODIURIL) 25 MG tablet Take 1 tablet (25 mg total) by mouth daily.  Marland Kitchen levothyroxine (SYNTHROID, LEVOTHROID) 50 MCG tablet Take 1 tablet (50 mcg total) by mouth daily before breakfast.  . LORazepam (ATIVAN) 0.5 MG tablet TAKE 1 TABLET BY MOUTH 2 TIMES DAILY AS NEEDED  . Multiple Vitamins-Minerals (PRESERVISION AREDS PO) Take 1 tablet by mouth every other day.   . ranitidine (ZANTAC) 150 MG tablet Take 150 mg by mouth 3 (three) times daily.   No facility-administered encounter medications on file as of 09/17/2017.      Review of Systems  Constitutional: Negative.   HENT: Negative.   Eyes: Negative.   Respiratory: Negative.   Cardiovascular: Negative.        Elevated BP  Gastrointestinal: Negative.   Endocrine: Negative.   Genitourinary: Negative.   Musculoskeletal: Negative.   Skin: Negative.   Allergic/Immunologic: Negative.   Neurological: Positive for dizziness, light-headedness and headaches.  Hematological: Negative.   Psychiatric/Behavioral: Negative.        Objective:  Physical Exam  Constitutional: She is oriented to person, place, and time. She appears well-developed and well-nourished. No distress.  The patient is alert and calm despite this blood pressure elevation.  HENT:  Head: Normocephalic and atraumatic.  Eyes: Pupils are equal, round, and reactive to light. Conjunctivae and EOM are normal. Right eye exhibits no discharge. Left eye exhibits no discharge. No scleral icterus.  Neck: Normal range of motion.  Cardiovascular: Normal rate, regular  rhythm and normal heart sounds.   No murmur heard. Heart is regular at 72/min  Pulmonary/Chest: Effort normal and breath sounds normal. No respiratory distress. She has no wheezes. She has no rales.  Abdominal: Soft. Bowel sounds are normal. She exhibits no mass. There is no tenderness. There is no rebound and no guarding.  Musculoskeletal: She exhibits no edema.  Patient uses a cane for ambulation  Lymphadenopathy:    She has no cervical adenopathy.  Neurological: She is alert and oriented to person, place, and time.  Skin: Skin is warm and dry. No rash noted.  Psychiatric: She has a normal mood and affect. Her behavior is normal. Judgment and thought content normal.  Nursing note and vitals reviewed.  BP (!) 165/102 (BP Location: Left Arm)   Pulse 86   Temp (!) 96.4 F (35.8 C) (Oral)   Ht 5\' 4"  (1.626 m)   Wt 171 lb (77.6 kg)   LMP 03/21/1971   BMI 29.35 kg/m         Assessment & Plan:  1. Essential hypertension -The third blood pressure reading today was 162/88 with a large cuff with the patient sitting in the right arm.  Typically her blood pressure readings at home are in the 140 or less range and in the 80 range. -Watch sodium intake as she is already doing. -Add amlodipine 5 mg one half p.o. daily and recheck blood pressure with home readings in 7-10 days  2. Nonintractable headache, unspecified chronicity pattern, unspecified headache type -Take Tylenol as needed for pain  3. Lightheadedness -Get blood pressure under better control -Watch sodium intake  Meds ordered this encounter  Medications  . amLODipine (NORVASC) 5 MG tablet    Sig: Take 1 tablet (5 mg total) by mouth daily.    Dispense:  30 tablet    Refill:  0  . levothyroxine (SYNTHROID, LEVOTHROID) 50 MCG tablet    Sig: Take 1 tablet (50 mcg total) by mouth daily before breakfast.    Dispense:  90 tablet    Refill:  3   Patient Instructions  Continue to watch sodium intake Start amlodipine 5 mg  one half daily Return to the office for blood pressure recheck in 7-10 days We will let Dr. Percival Spanish know of this change in your medicine and make sure that he agrees with this.  Arrie Senate MD

## 2017-09-17 NOTE — Telephone Encounter (Signed)
Multiple high readings  One dizzy spell, and some light headed Pt coming in for DWM to see

## 2017-09-17 NOTE — Patient Instructions (Signed)
Continue to watch sodium intake Start amlodipine 5 mg one half daily Return to the office for blood pressure recheck in 7-10 days We will let Dr. Percival Spanish know of this change in your medicine and make sure that he agrees with this.

## 2017-09-25 DIAGNOSIS — M79676 Pain in unspecified toe(s): Secondary | ICD-10-CM | POA: Diagnosis not present

## 2017-09-25 DIAGNOSIS — B351 Tinea unguium: Secondary | ICD-10-CM | POA: Diagnosis not present

## 2017-09-27 ENCOUNTER — Ambulatory Visit (INDEPENDENT_AMBULATORY_CARE_PROVIDER_SITE_OTHER): Payer: Medicare Other | Admitting: Family Medicine

## 2017-09-27 ENCOUNTER — Encounter: Payer: Self-pay | Admitting: Family Medicine

## 2017-09-27 VITALS — BP 142/84 | HR 85 | Temp 96.9°F | Ht 64.0 in | Wt 170.0 lb

## 2017-09-27 DIAGNOSIS — I480 Paroxysmal atrial fibrillation: Secondary | ICD-10-CM

## 2017-09-27 DIAGNOSIS — I10 Essential (primary) hypertension: Secondary | ICD-10-CM | POA: Diagnosis not present

## 2017-09-27 NOTE — Patient Instructions (Signed)
Continue with amlodipine 2-1/2 mg daily but add an additional dose at nighttime Monitor blood pressures over the next couple weeks and bring readings by for review Continue to watch sodium intake Return to the clinic in January as planned

## 2017-09-27 NOTE — Progress Notes (Signed)
Subjective:    Patient ID: Emily Wagner, female    DOB: 27-May-1933, 83 y.o.   MRN: [id number]  HPI Patient here today for 1 week follow up on HTN.  Patient is doing well overall.  She denies any complaints including chest pain shortness of breath problems with constipation nausea vomiting or diarrhea and no problems with passing her water.  She denies any edema.  She is only taking amlodipine 2-1/2 mg once daily.  She brings in blood pressures for review and these will be scanned into the record.  They are running higher in the morning and lower in the evening.  The repeat blood pressure here was 144/90 in the right arm sitting with a regular cuff.    Patient Active Problem List   Diagnosis Date Noted  . SOB (shortness of breath) 05/17/2017  . Long term (current) use of anticoagulants 11/11/2015  . Atrial fibrillation ([place]) 11/04/2015  . 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 Infection of urinary tract 10/31/2015  . Unstable angina ([place]) 10/29/2015  . CAD (coronary artery disease) 10/29/2015  . Benign hypertensive heart disease without heart failure   . Interstitial cystitis   . Osteopenia   . Pelvic fracture ([place]) 05/30/2015  . Allergy to multiple medications 07/15/2013  . Renal insufficiency   . Adjustment disorder with mixed anxiety and depressed mood   . Osteoporosis   . ASCVD (arteriosclerotic cardiovascular disease)   . 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  . HYPERCHOLESTEROLEMIA 05/02/2008  . Essential hypertension 05/02/2008  . INTERSTITIAL CYSTITIS 05/02/2008  . DIVERTICULOSIS OF COLON 08/27/2003   Outpatient Encounter Prescriptions as of 09/27/2017  Medication Sig  . amLODipine (NORVASC) 5 MG tablet Take 1 tablet (5 mg total) by mouth daily.  Marland Kitchen aspirin EC 81 MG tablet Take 81 mg by mouth daily.  . Cholecalciferol (VITAMIN D) 2000 UNITS CAPS Take 2,000 Units by mouth daily.   . Cyanocobalamin (VITAMIN B-12 PO) Take 5,000 mcg by mouth every morning.  . fexofenadine  (ALLEGRA) 180 MG tablet Take 180 mg by mouth daily.  . hydrochlorothiazide (HYDRODIURIL) 25 MG tablet Take 1 tablet (25 mg total) by mouth daily.  Marland Kitchen levothyroxine (SYNTHROID, LEVOTHROID) 50 MCG tablet Take 1 tablet (50 mcg total) by mouth daily before breakfast.  . LORazepam (ATIVAN) 0.5 MG tablet TAKE 1 TABLET BY MOUTH 2 TIMES DAILY AS NEEDED  . Multiple Vitamins-Minerals (PRESERVISION AREDS PO) Take 1 tablet by mouth every other day.   . ranitidine (ZANTAC) 150 MG tablet Take 150 mg by mouth 3 (three) times daily.   No facility-administered encounter medications on file as of 09/27/2017.       Review of Systems  Constitutional: Negative.   HENT: Negative.   Eyes: Negative.   Respiratory: Negative.   Cardiovascular: Negative.   Gastrointestinal: Negative.   Endocrine: Negative.   Genitourinary: Negative.   Musculoskeletal: Negative.   Skin: Negative.   Allergic/Immunologic: Negative.   Neurological: Negative.   Hematological: Negative.   Psychiatric/Behavioral: Negative.        Objective:   Physical Exam  Constitutional: She is oriented to person, place, and time. She appears well-developed and well-nourished. No distress.  The patient is pleasant and doing well with no specific complaints other than worried about her blood pressure still being slightly elevated.  HENT:  Head: Normocephalic and atraumatic.  Eyes: Pupils are equal, round, and reactive to light. Conjunctivae and EOM are normal. Right eye exhibits no discharge. Left eye exhibits no discharge. No scleral  icterus.  Neck: Normal range of motion.  Cardiovascular: Normal rate and normal heart sounds.   No murmur heard. Slightly irregular at 72/min with what sounds like an occasional PVC  Pulmonary/Chest: Effort normal and breath sounds normal. She has no wheezes. She has no rales.  Musculoskeletal: Normal range of motion. She exhibits no edema.  Neurological: She is alert and oriented to person, place, and time.    Skin: Skin is warm and dry. No rash noted.  Psychiatric: She has a normal mood and affect. Her behavior is normal. Judgment and thought content normal.  Nursing note and vitals reviewed.  BP (!) 142/84 (BP Location: Left Arm)   Pulse 85   Temp (!) 96.9 F (36.1 C) (Oral)   Ht 5\' 4"  (1.626 m)   Wt 170 lb (77.1 kg)   LMP 03/21/1971   BMI 29.18 kg/m         Assessment & Plan:  1. Essential hypertension -Blood pressure numbers are slightly improved from previously but still somewhat elevated in the morning both systolic and diastolic. -Increase amlodipine to 2-1/2 mg twice daily and bring readings by for review in a couple of weeks   2. Paroxysmal atrial fibrillation (HCC) -The heart today had normal sinus rhythm with a rare PVC.  Patient Instructions  Continue with amlodipine 2-1/2 mg daily but add an additional dose at nighttime Monitor blood pressures over the next couple weeks and bring readings by for review Continue to watch sodium intake Return to the clinic in January as planned  Arrie Senate MD

## 2017-10-11 ENCOUNTER — Other Ambulatory Visit: Payer: Self-pay | Admitting: Cardiology

## 2017-10-12 ENCOUNTER — Other Ambulatory Visit: Payer: Self-pay | Admitting: *Deleted

## 2017-10-12 MED ORDER — HYDROCHLOROTHIAZIDE 25 MG PO TABS: 25.0000 mg | ORAL_TABLET | Freq: Every day | ORAL | 2 refills | Status: DC

## 2017-10-12 MED ORDER — AMLODIPINE BESYLATE 5 MG PO TABS: 5.0000 mg | ORAL_TABLET | Freq: Every day | ORAL | 2 refills | Status: DC

## 2017-10-15 DIAGNOSIS — M5430 Sciatica, unspecified side: Secondary | ICD-10-CM | POA: Diagnosis not present

## 2017-10-15 DIAGNOSIS — M9905 Segmental and somatic dysfunction of pelvic region: Secondary | ICD-10-CM | POA: Diagnosis not present

## 2017-10-15 DIAGNOSIS — M9904 Segmental and somatic dysfunction of sacral region: Secondary | ICD-10-CM | POA: Diagnosis not present

## 2017-10-15 DIAGNOSIS — M9903 Segmental and somatic dysfunction of lumbar region: Secondary | ICD-10-CM | POA: Diagnosis not present

## 2017-10-17 DIAGNOSIS — M9903 Segmental and somatic dysfunction of lumbar region: Secondary | ICD-10-CM | POA: Diagnosis not present

## 2017-10-17 DIAGNOSIS — M9904 Segmental and somatic dysfunction of sacral region: Secondary | ICD-10-CM | POA: Diagnosis not present

## 2017-10-17 DIAGNOSIS — M9905 Segmental and somatic dysfunction of pelvic region: Secondary | ICD-10-CM | POA: Diagnosis not present

## 2017-10-17 DIAGNOSIS — M5430 Sciatica, unspecified side: Secondary | ICD-10-CM | POA: Diagnosis not present

## 2017-10-30 ENCOUNTER — Other Ambulatory Visit: Payer: Self-pay | Admitting: Family Medicine

## 2017-10-30 ENCOUNTER — Telehealth: Payer: Self-pay | Admitting: Family Medicine

## 2017-10-30 MED ORDER — LEVOTHYROXINE SODIUM 50 MCG PO TABS: 50.0000 ug | ORAL_TABLET | Freq: Every day | ORAL | 3 refills | Status: DC

## 2017-10-30 NOTE — Telephone Encounter (Signed)
Okay to refill? 

## 2017-10-30 NOTE — Telephone Encounter (Signed)
pts thyroid refill sent into [facility] She is requesting Ciprofloxin 500 mg 1 tab qd #90 prn as well

## 2017-10-30 NOTE — Telephone Encounter (Signed)
She likes to keep this on hand for UTI flares - DR Laurance Flatten and DR Jeffie Pollock has let her do this in the past - she will only use PRN when something starts up   Cystitis

## 2017-10-30 NOTE — Telephone Encounter (Signed)
I am not sure why she is taking Cipro 500 mg daily, long-term use of this medication has a lot of side effects.  Please get more details.

## 2017-10-31 MED ORDER — CIPROFLOXACIN HCL 500 MG PO TABS: 500.0000 mg | ORAL_TABLET | Freq: Every day | ORAL | 0 refills | Status: DC | PRN

## 2017-10-31 NOTE — Addendum Note (Signed)
Addended by: Zannie Cove on: 10/31/2017 10:37 AM   Modules accepted: Orders

## 2017-12-07 ENCOUNTER — Telehealth: Payer: Self-pay | Admitting: Family Medicine

## 2017-12-07 NOTE — Telephone Encounter (Signed)
Pt calling back for Jamie upset she cant get Roselyn Reef. Will not speak to another nurse. Please advise

## 2017-12-07 NOTE — Telephone Encounter (Signed)
Called -  Pt aware of appt and that we will call her to reschedule IF the weather is bad

## 2017-12-07 NOTE — Telephone Encounter (Signed)
Patient says she has no urgent health need but refuses to give a specific reason for her call.  She is aware that Ms. Bullins may not return her call till late afternoon due to a busy schedule.  She will only discuss needs with this nurse due to provider telling her to always ask for his nurse specifically.

## 2017-12-11 ENCOUNTER — Ambulatory Visit (INDEPENDENT_AMBULATORY_CARE_PROVIDER_SITE_OTHER): Payer: Medicare Other

## 2017-12-11 ENCOUNTER — Encounter: Payer: Self-pay | Admitting: Family Medicine

## 2017-12-11 ENCOUNTER — Ambulatory Visit (INDEPENDENT_AMBULATORY_CARE_PROVIDER_SITE_OTHER): Payer: Medicare Other | Admitting: Family Medicine

## 2017-12-11 VITALS — BP 128/82 | HR 96 | Temp 96.7°F | Ht 64.0 in | Wt 172.0 lb

## 2017-12-11 DIAGNOSIS — E559 Vitamin D deficiency, unspecified: Secondary | ICD-10-CM

## 2017-12-11 DIAGNOSIS — I2510 Atherosclerotic heart disease of native coronary artery without angina pectoris: Secondary | ICD-10-CM | POA: Diagnosis not present

## 2017-12-11 DIAGNOSIS — I10 Essential (primary) hypertension: Secondary | ICD-10-CM

## 2017-12-11 DIAGNOSIS — I480 Paroxysmal atrial fibrillation: Secondary | ICD-10-CM

## 2017-12-11 DIAGNOSIS — M25562 Pain in left knee: Secondary | ICD-10-CM

## 2017-12-11 DIAGNOSIS — E7800 Pure hypercholesterolemia, unspecified: Secondary | ICD-10-CM | POA: Diagnosis not present

## 2017-12-11 DIAGNOSIS — K219 Gastro-esophageal reflux disease without esophagitis: Secondary | ICD-10-CM

## 2017-12-11 DIAGNOSIS — M1712 Unilateral primary osteoarthritis, left knee: Secondary | ICD-10-CM

## 2017-12-11 DIAGNOSIS — N289 Disorder of kidney and ureter, unspecified: Secondary | ICD-10-CM

## 2017-12-11 MED ORDER — HYDROCHLOROTHIAZIDE 25 MG PO TABS: 25.0000 mg | ORAL_TABLET | Freq: Every day | ORAL | 3 refills | Status: DC

## 2017-12-11 MED ORDER — LORAZEPAM 0.5 MG PO TABS: ORAL_TABLET | ORAL | 4 refills | Status: DC

## 2017-12-11 NOTE — Progress Notes (Addendum)
Subjective:    Patient ID: Emily Wagner, female    DOB: [birth year], 84 y.o.   MRN: [id number]  HPI Pt here for follow up and management of chronic medical problems which includes hypertension, a fib and hyperlipidemia. She is taking medication regularly.  The patient is doing well other than some complaints with left knee pain and a lesion on her back.  She is requesting refills on her lorazepam and her HCTZ.  She is due to get a chest x-ray today, will be given an FOBT to return and will get lab work today.  The patient is followed regularly by the cardiologist with a history of coronary artery bypass grafting.  She also has had several falls and a fracture.  The knee pain is worse with walking.  This patient has a history of an elevated creatinine with the last one being 1.20.  Patient takes an extra her half of an amlodipine only when her blood pressure is elevated otherwise she only takes HCTZ.  She was under a lot of stress at Christmas and this is when she had to take an extra half of the amlodipine which she normally does not take any at all of other than just as needed.  She says that her left knee has been hurting for about 3 weeks with no specific injury.  She did see the ophthalmologist in September.  She denies any chest pain or shortness of breath anymore than usual regularly in September yearly.  She has no indigestion as long she takes her Zantac.  She denies any change in bowel habits blood in the stool or black tarry bowel movements.  She sees Dr. Jeffie Pollock only if needed for her interstitial cystitis and has been doing better with this recently.  Her blood pressures from home were reviewed and will be scanned into the record.    Patient Active Problem List   Diagnosis Date Noted  . SOB (shortness of breath) 05/17/2017  . Long term (current) use of anticoagulants 11/11/2015  . Atrial fibrillation ([place]) 11/04/2015  . 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 Infection of urinary tract  10/31/2015  . Unstable angina ([place]) 10/29/2015  . CAD (coronary artery disease) 10/29/2015  . Benign hypertensive heart disease without heart failure   . Interstitial cystitis   . Osteopenia   . Pelvic fracture ([place]) 05/30/2015  . Allergy to multiple medications 07/15/2013  . Renal insufficiency   . Adjustment disorder with mixed anxiety and depressed mood   . Osteoporosis   . ASCVD (arteriosclerotic cardiovascular disease)   . 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  . HYPERCHOLESTEROLEMIA 05/02/2008  . Essential hypertension 05/02/2008  . INTERSTITIAL CYSTITIS 05/02/2008  . DIVERTICULOSIS OF COLON 08/27/2003   Outpatient Encounter Medications as of 12/11/2017  Medication Sig  . amLODipine (NORVASC) 5 MG tablet Take 1 tablet (5 mg total) daily by mouth.  Marland Kitchen aspirin EC 81 MG tablet Take 81 mg by mouth daily.  . Cholecalciferol (VITAMIN D) 2000 UNITS CAPS Take 2,000 Units by mouth daily.   . Cyanocobalamin (VITAMIN B-12 PO) Take 5,000 mcg by mouth every morning.  . fexofenadine (ALLEGRA) 180 MG tablet Take 180 mg by mouth daily.  . hydrochlorothiazide (HYDRODIURIL) 25 MG tablet Take 1 tablet (25 mg total) daily by mouth.  . levothyroxine (SYNTHROID, LEVOTHROID) 50 MCG tablet Take 1 tablet (50 mcg total) by mouth daily before breakfast.  . LORazepam (ATIVAN) 0.5 MG tablet TAKE 1 TABLET BY MOUTH 2 TIMES DAILY AS NEEDED  .  Multiple Vitamins-Minerals (PRESERVISION AREDS PO) Take 1 tablet by mouth every other day.   . ranitidine (ZANTAC) 150 MG tablet Take 150 mg by mouth 3 (three) times daily.  . [DISCONTINUED] ciprofloxacin (CIPRO) 500 MG tablet Take 1 tablet (500 mg total) by mouth daily as needed.   No facility-administered encounter medications on file as of 12/11/2017.      Review of Systems  Constitutional: Negative.   HENT: Negative.   Eyes: Negative.   Respiratory: Negative.   Cardiovascular: Negative.   Gastrointestinal: Negative.   Endocrine: Negative.   Genitourinary: Negative.     Musculoskeletal: Positive for arthralgias (left knee pain when walking).  Skin: Negative.        Skin lesion on back   Allergic/Immunologic: Negative.   Neurological: Negative.   Hematological: Negative.   Psychiatric/Behavioral: Negative.        Objective:   Physical Exam  Constitutional: She is oriented to person, place, and time. She appears well-developed and well-nourished. No distress.  Patient is pleasant and alert and somewhat anxious.  HENT:  Head: Normocephalic and atraumatic.  Right Ear: External ear normal.  Left Ear: External ear normal.  Mouth/Throat: Oropharynx is clear and moist. No oropharyngeal exudate.  Slight nasal congestion bilaterally  Eyes: Conjunctivae and EOM are normal. Pupils are equal, round, and reactive to light. Right eye exhibits no discharge. Left eye exhibits no discharge. No scleral icterus.  Neck: Normal range of motion. Neck supple. No thyromegaly present.  No bruits thyromegaly or anterior cervical adenopathy  Cardiovascular: Normal rate, regular rhythm, normal heart sounds and intact distal pulses.  No murmur heard. Heart is slightly irregular at about 72/min  Pulmonary/Chest: Effort normal and breath sounds normal. No respiratory distress. She has no wheezes. She has no rales.  Clear anteriorly and posteriorly  Abdominal: Soft. Bowel sounds are normal. She exhibits no mass. There is no tenderness. There is no rebound and no guarding.  Abdominal obesity without masses tenderness or organ enlargement or bruits or inguinal adenopathy  Musculoskeletal: She exhibits tenderness. She exhibits no edema.  The patient is using a cane for ambulation of hip fracture in the right hip.  The left knee appears to be more swollen than the right knee and there is tenderness at the medial joint line of the left knee to palpation.  There is no rubor or fever.  Lymphadenopathy:    She has no cervical adenopathy.  Neurological: She is alert and oriented to  person, place, and time. She has normal reflexes. No cranial nerve deficit.  Skin: Skin is warm and dry. No rash noted.  The lesion on her back that she was concerned about that she cannot see was a seborrheic keratosis.  She was reassured about this.  Psychiatric: She has a normal mood and affect. Her behavior is normal. Judgment and thought content normal.  Nursing note and vitals reviewed.  BP 128/82 (BP Location: Left Arm)   Pulse 96   Temp (!) 96.7 F (35.9 C) (Oral)   Ht _0  (1.626 m)   Wt 172 lb (78 kg)   LMP 03/21/1971   BMI 29.52 kg/m   The left medial knee joint line was injected at the point of tenderness with Kenalog 40 and Marcaine 1 cc.  This was done under sterile technique.  The patient tolerated the procedure well. We will get x-rays of the knee with results pending===  EKG done today because of irregularity noted on physical exam showed normal sinus rhythm with  old MI    Assessment & Plan:  1. Essential hypertension -The blood pressure is good today and the patient will continue with current treatment - BMP8+EGFR - CBC with Differential/Platelet - Hepatic function panel - DG Chest 2 View; Future  2. Paroxysmal atrial fibrillation (HCC) -Patient has a history of paroxysmal atrial fibrillation and the heart was slightly irregular today at 72/min. - CBC with Differential/Platelet - DG Chest 2 View; Future  3. Gastroesophageal reflux disease, esophagitis presence not specified -The reflux is controlled with ranitidine. - CBC with Differential/Platelet  4. ASCVD (arteriosclerotic cardiovascular disease) -She sees the cardiologist yearly in September - CBC with Differential/Platelet - DG Chest 2 View; Future  5. Vitamin D deficiency -Continue current treatment pending results of lab work - CBC with Differential/Platelet - VITAMIN D 25 Hydroxy (Vit-D Deficiency, Fractures)  6. HYPERCHOLESTEROLEMIA -The patient is statin intolerant and will continue with  as aggressive therapeutic lifestyle changes as possible - CBC with Differential/Platelet - DG Chest 2 View; Future  7. Renal insufficiency -She should continue to avoid anti-inflammatory medicines and keep her blood pressure under as good a control as possible  8. Acute pain of left knee -Inject knee with 40 of Depo-Medrol and 1 cc of Marcaine under sterile technique.  Patient tolerated procedure well  9. Primary osteoarthritis of left knee -X-ray left knee, if no improvement appointment with orthopedic specialist that comes to this office  10.  Heart irregularity on physical exam -EKG is stable  Meds ordered this encounter  Medications  . hydrochlorothiazide (HYDRODIURIL) 25 MG tablet    Sig: Take 1 tablet (25 mg total) by mouth daily.    Dispense:  90 tablet    Refill:  3  . LORazepam (ATIVAN) 0.5 MG tablet    Sig: TAKE 1 TABLET BY MOUTH 2 TIMES DAILY AS NEEDED    Dispense:  60 tablet    Refill:  4   Patient Instructions                       Medicare Annual Wellness Visit  [facility] and the medical providers at [facility] strive to bring you the best medical care.  In doing so we not only want to address your current medical conditions and concerns but also to detect new conditions early and prevent illness, disease and health-related problems.    Medicare offers a yearly Wellness Visit which allows our clinical staff to assess your need for preventative services including immunizations, lifestyle education, counseling to decrease risk of preventable diseases and screening for fall risk and other medical concerns.    This visit is provided free of charge (no copay) for all Medicare recipients. The clinical pharmacists at [facility] have begun to conduct these Wellness Visits which will also include a thorough review of all your medications.    As you primary medical provider recommend that you make an appointment for your Annual  Wellness Visit if you have not done so already this year.  You may set up this appointment before you leave today or you may call back ([phone]) and schedule an appointment.  Please make sure when you call that you mention that you are scheduling your Annual Wellness Visit with the clinical pharmacist so that the appointment may be made for the proper length of time.     Continue current medications. Continue good therapeutic lifestyle changes which include good diet and exercise. Fall precautions discussed with patient. If  an FOBT was given today- please return it to our front desk. If you are over 75 years old - you may need Prevnar 72 or the adult Pneumonia vaccine.  **Flu shots are available--- please call and schedule a FLU-CLINIC appointment**  After your visit with [name] today you will receive a survey in the mail or online from [employer] regarding your care with [name]. Please take a moment to fill this out. Your feedback is very important to [name] as you can help [name] better understand your patient needs as well as improve your experience and satisfaction. WE CARE ABOUT YOU!!!     Arrie Senate MD

## 2017-12-11 NOTE — Addendum Note (Signed)
Addended by: Zannie Cove on: 12/11/2017 11:20 AM   Modules accepted: Orders

## 2017-12-11 NOTE — Patient Instructions (Signed)
Medicare Annual Wellness Visit  [facility] and the medical providers at [facility] strive to bring you the best medical care.  In doing so we not only want to address your current medical conditions and concerns but also to detect new conditions early and prevent illness, disease and health-related problems.    Medicare offers a yearly Wellness Visit which allows our clinical staff to assess your need for preventative services including immunizations, lifestyle education, counseling to decrease risk of preventable diseases and screening for fall risk and other medical concerns.    This visit is provided free of charge (no copay) for all Medicare recipients. The clinical pharmacists at [facility] have begun to conduct these Wellness Visits which will also include a thorough review of all your medications.    As you primary medical provider recommend that you make an appointment for your Annual Wellness Visit if you have not done so already this year.  You may set up this appointment before you leave today or you may call back ([phone]) and schedule an appointment.  Please make sure when you call that you mention that you are scheduling your Annual Wellness Visit with the clinical pharmacist so that the appointment may be made for the proper length of time.     Continue current medications. Continue good therapeutic lifestyle changes which include good diet and exercise. Fall precautions discussed with patient. If an FOBT was given today- please return it to our front desk. If you are over 50 years old - you may need Prevnar 13 or the adult Pneumonia vaccine.  **Flu shots are available--- please call and schedule a FLU-CLINIC appointment**  After your visit with us today you will receive a survey in the mail or online from [employer] regarding your care with us. Please take a moment to fill this out. Your feedback is very  important to us as you can help us better understand your patient needs as well as improve your experience and satisfaction. WE CARE ABOUT YOU!!!    

## 2017-12-12 ENCOUNTER — Encounter: Payer: Self-pay | Admitting: Family Medicine

## 2017-12-12 DIAGNOSIS — I700 Atherosclerosis of aorta: Secondary | ICD-10-CM | POA: Insufficient documentation

## 2017-12-12 LAB — CBC WITH DIFFERENTIAL/PLATELET
BASOS: 0 %
Basophils Absolute: 0 10*3/uL (ref 0.0–0.2)
EOS (ABSOLUTE): 0.1 10*3/uL (ref 0.0–0.4)
Eos: 2 %
Hematocrit: 44.7 % (ref 34.0–46.6)
Hemoglobin: 15.2 g/dL (ref 11.1–15.9)
Immature Grans (Abs): 0 10*3/uL (ref 0.0–0.1)
Immature Granulocytes: 0 %
Lymphocytes Absolute: 1.2 10*3/uL (ref 0.7–3.1)
Lymphs: 20 %
MCH: 31.6 pg (ref 26.6–33.0)
MCHC: 34 g/dL (ref 31.5–35.7)
MCV: 93 fL (ref 79–97)
MONOS ABS: 0.6 10*3/uL (ref 0.1–0.9)
Monocytes: 10 %
Neutrophils Absolute: 4 10*3/uL (ref 1.4–7.0)
Neutrophils: 68 %
PLATELETS: 232 10*3/uL (ref 150–379)
RBC: 4.81 x10E6/uL (ref 3.77–5.28)
RDW: 13.2 % (ref 12.3–15.4)
WBC: 5.9 10*3/uL (ref 3.4–10.8)

## 2017-12-12 LAB — BMP8+EGFR
BUN/Creatinine Ratio: 23 (ref 12–28)
BUN: 26 mg/dL (ref 8–27)
CO2: 25 mmol/L (ref 20–29)
Calcium: 9.1 mg/dL (ref 8.7–10.3)
Chloride: 99 mmol/L (ref 96–106)
Creatinine, Ser: 1.14 mg/dL — ABNORMAL HIGH (ref 0.57–1.00)
GFR calc Af Amer: 52 mL/min/{1.73_m2} — ABNORMAL LOW (ref >59)
GFR, EST NON AFRICAN AMERICAN: 45 mL/min/{1.73_m2} — AB (ref >59)
GLUCOSE: 106 mg/dL — AB (ref 65–99)
POTASSIUM: 3 mmol/L — AB (ref 3.5–5.2)
SODIUM: 144 mmol/L (ref 134–144)

## 2017-12-12 LAB — HEPATIC FUNCTION PANEL
ALT: 11 IU/L (ref 0–32)
AST: 19 IU/L (ref 0–40)
Albumin: 4.2 g/dL (ref 3.5–4.7)
Alkaline Phosphatase: 98 IU/L (ref 39–117)
BILIRUBIN TOTAL: 0.6 mg/dL (ref 0.0–1.2)
Bilirubin, Direct: 0.17 mg/dL (ref 0.00–0.40)
TOTAL PROTEIN: 7.4 g/dL (ref 6.0–8.5)

## 2017-12-12 LAB — VITAMIN D 25 HYDROXY (VIT D DEFICIENCY, FRACTURES): Vit D, 25-Hydroxy: 54.7 ng/mL (ref 30.0–100.0)

## 2017-12-12 MED ORDER — TRIAMCINOLONE ACETONIDE 40 MG/ML IJ SUSP
40.0000 mg | Freq: Once | INTRAMUSCULAR | Status: AC
  Administered 2017-12-11: 40 mg via INTRA_ARTICULAR

## 2017-12-12 NOTE — Addendum Note (Signed)
Addended by: Zannie Cove on: 12/12/2017 11:36 AM   Modules accepted: Orders

## 2017-12-13 ENCOUNTER — Other Ambulatory Visit: Payer: Self-pay | Admitting: *Deleted

## 2017-12-13 DIAGNOSIS — E876 Hypokalemia: Secondary | ICD-10-CM

## 2017-12-13 MED ORDER — POTASSIUM CHLORIDE ER 10 MEQ PO TBCR: 10.0000 meq | EXTENDED_RELEASE_TABLET | Freq: Two times a day (BID) | ORAL | 0 refills | Status: DC

## 2017-12-28 ENCOUNTER — Other Ambulatory Visit: Payer: Medicare Other

## 2017-12-28 DIAGNOSIS — E876 Hypokalemia: Secondary | ICD-10-CM | POA: Diagnosis not present

## 2017-12-29 LAB — BMP8+EGFR
BUN / CREAT RATIO: 20 (ref 12–28)
BUN: 23 mg/dL (ref 8–27)
CALCIUM: 9.3 mg/dL (ref 8.7–10.3)
CHLORIDE: 99 mmol/L (ref 96–106)
CO2: 27 mmol/L (ref 20–29)
Creatinine, Ser: 1.17 mg/dL — ABNORMAL HIGH (ref 0.57–1.00)
GFR calc non Af Amer: 43 mL/min/{1.73_m2} — ABNORMAL LOW (ref >59)
GFR, EST AFRICAN AMERICAN: 50 mL/min/{1.73_m2} — AB (ref >59)
Glucose: 82 mg/dL (ref 65–99)
POTASSIUM: 4.1 mmol/L (ref 3.5–5.2)
Sodium: 140 mmol/L (ref 134–144)

## 2017-12-31 ENCOUNTER — Other Ambulatory Visit: Payer: Self-pay | Admitting: *Deleted

## 2017-12-31 DIAGNOSIS — E876 Hypokalemia: Secondary | ICD-10-CM

## 2018-01-09 ENCOUNTER — Other Ambulatory Visit: Payer: Self-pay | Admitting: Family Medicine

## 2018-01-22 DIAGNOSIS — M9904 Segmental and somatic dysfunction of sacral region: Secondary | ICD-10-CM | POA: Diagnosis not present

## 2018-01-22 DIAGNOSIS — M9903 Segmental and somatic dysfunction of lumbar region: Secondary | ICD-10-CM | POA: Diagnosis not present

## 2018-01-22 DIAGNOSIS — M5430 Sciatica, unspecified side: Secondary | ICD-10-CM | POA: Diagnosis not present

## 2018-01-22 DIAGNOSIS — M9905 Segmental and somatic dysfunction of pelvic region: Secondary | ICD-10-CM | POA: Diagnosis not present

## 2018-01-24 DIAGNOSIS — M9903 Segmental and somatic dysfunction of lumbar region: Secondary | ICD-10-CM | POA: Diagnosis not present

## 2018-01-24 DIAGNOSIS — M9904 Segmental and somatic dysfunction of sacral region: Secondary | ICD-10-CM | POA: Diagnosis not present

## 2018-01-24 DIAGNOSIS — B351 Tinea unguium: Secondary | ICD-10-CM | POA: Diagnosis not present

## 2018-01-24 DIAGNOSIS — M9905 Segmental and somatic dysfunction of pelvic region: Secondary | ICD-10-CM | POA: Diagnosis not present

## 2018-01-24 DIAGNOSIS — M79676 Pain in unspecified toe(s): Secondary | ICD-10-CM | POA: Diagnosis not present

## 2018-01-24 DIAGNOSIS — M5430 Sciatica, unspecified side: Secondary | ICD-10-CM | POA: Diagnosis not present

## 2018-01-25 ENCOUNTER — Other Ambulatory Visit: Payer: Self-pay | Admitting: Family Medicine

## 2018-01-28 NOTE — Telephone Encounter (Signed)
Phoned in.

## 2018-01-30 ENCOUNTER — Other Ambulatory Visit: Payer: Medicare Other

## 2018-01-30 DIAGNOSIS — E876 Hypokalemia: Secondary | ICD-10-CM

## 2018-01-31 ENCOUNTER — Telehealth: Payer: Self-pay | Admitting: Family Medicine

## 2018-01-31 LAB — BMP8+EGFR
BUN/Creatinine Ratio: 17 (ref 12–28)
BUN: 20 mg/dL (ref 8–27)
CALCIUM: 9.6 mg/dL (ref 8.7–10.3)
CHLORIDE: 100 mmol/L (ref 96–106)
CO2: 26 mmol/L (ref 20–29)
Creatinine, Ser: 1.18 mg/dL — ABNORMAL HIGH (ref 0.57–1.00)
GFR calc non Af Amer: 43 mL/min/{1.73_m2} — ABNORMAL LOW (ref >59)
GFR, EST AFRICAN AMERICAN: 50 mL/min/{1.73_m2} — AB (ref >59)
Glucose: 84 mg/dL (ref 65–99)
POTASSIUM: 3.4 mmol/L — AB (ref 3.5–5.2)
Sodium: 141 mmol/L (ref 134–144)

## 2018-01-31 MED ORDER — POTASSIUM CHLORIDE ER 10 MEQ PO TBCR: 10.0000 meq | EXTENDED_RELEASE_TABLET | Freq: Two times a day (BID) | ORAL | 3 refills | Status: DC

## 2018-01-31 NOTE — Telephone Encounter (Signed)
Do what ever is cheaper for patient.  She needs to take the medicine regularly.  If K Dur is preferred then do this one

## 2018-01-31 NOTE — Telephone Encounter (Signed)
Spoke to pt and advised her to call her insurance to see what Potassium Rx is preferred and call us back to let us know. Pt voiced understanding and will call back to let us know.

## 2018-01-31 NOTE — Telephone Encounter (Signed)
Called pt - labs discussed and potassium supplement is too expensive.   Klor Con 10 mEq is $45 a month  Can we do K-Dur 10 mEq - this one is preferred by insurance????

## 2018-01-31 NOTE — Telephone Encounter (Signed)
Pt aware - med sen to [employer]

## 2018-02-15 ENCOUNTER — Telehealth: Payer: Self-pay | Admitting: Family Medicine

## 2018-02-15 NOTE — Telephone Encounter (Signed)
Please call patient with this information

## 2018-02-15 NOTE — Telephone Encounter (Signed)
PT is taking potassium and she was reading about allergic reactions, she has states that she has several rash and blister spots pilling off on herstomach, breast, knee, and under arm, reports no SOB. She is afraid to keep taking the medication and is wanting to know what DR Laurance Flatten is wanting her to do.

## 2018-02-15 NOTE — Telephone Encounter (Signed)
Pt was left a detailed VM of directions

## 2018-02-15 NOTE — Telephone Encounter (Signed)
Leave the potassium off today.  But the real problem is she may need the potassium for her heart.  She should have a BMP rechecked in 7-10 days after she is been off to see if the rash goes away and we may have to try a different kind of potassium if the potassium goes back down.

## 2018-02-25 ENCOUNTER — Other Ambulatory Visit: Payer: Medicare Other

## 2018-02-25 DIAGNOSIS — E876 Hypokalemia: Secondary | ICD-10-CM | POA: Diagnosis not present

## 2018-02-26 LAB — BMP8+EGFR
BUN/Creatinine Ratio: 21 (ref 12–28)
BUN: 27 mg/dL (ref 8–27)
CO2: 23 mmol/L (ref 20–29)
CREATININE: 1.3 mg/dL — AB (ref 0.57–1.00)
Calcium: 9.5 mg/dL (ref 8.7–10.3)
Chloride: 102 mmol/L (ref 96–106)
GFR calc Af Amer: 44 mL/min/{1.73_m2} — ABNORMAL LOW (ref >59)
GFR calc non Af Amer: 38 mL/min/{1.73_m2} — ABNORMAL LOW (ref >59)
GLUCOSE: 84 mg/dL (ref 65–99)
Potassium: 4 mmol/L (ref 3.5–5.2)
SODIUM: 146 mmol/L — AB (ref 134–144)

## 2018-03-05 ENCOUNTER — Ambulatory Visit (INDEPENDENT_AMBULATORY_CARE_PROVIDER_SITE_OTHER): Payer: Medicare Other | Admitting: Family Medicine

## 2018-03-05 ENCOUNTER — Encounter: Payer: Self-pay | Admitting: Family Medicine

## 2018-03-05 VITALS — BP 148/88 | HR 84 | Temp 97.1°F | Ht 64.0 in | Wt 171.0 lb

## 2018-03-05 DIAGNOSIS — E876 Hypokalemia: Secondary | ICD-10-CM

## 2018-03-05 DIAGNOSIS — I10 Essential (primary) hypertension: Secondary | ICD-10-CM

## 2018-03-05 DIAGNOSIS — E559 Vitamin D deficiency, unspecified: Secondary | ICD-10-CM

## 2018-03-05 DIAGNOSIS — K219 Gastro-esophageal reflux disease without esophagitis: Secondary | ICD-10-CM

## 2018-03-05 DIAGNOSIS — N289 Disorder of kidney and ureter, unspecified: Secondary | ICD-10-CM | POA: Diagnosis not present

## 2018-03-05 DIAGNOSIS — I480 Paroxysmal atrial fibrillation: Secondary | ICD-10-CM

## 2018-03-05 DIAGNOSIS — E7800 Pure hypercholesterolemia, unspecified: Secondary | ICD-10-CM | POA: Diagnosis not present

## 2018-03-05 DIAGNOSIS — I2510 Atherosclerotic heart disease of native coronary artery without angina pectoris: Secondary | ICD-10-CM | POA: Diagnosis not present

## 2018-03-05 DIAGNOSIS — R5383 Other fatigue: Secondary | ICD-10-CM | POA: Diagnosis not present

## 2018-03-05 DIAGNOSIS — M1712 Unilateral primary osteoarthritis, left knee: Secondary | ICD-10-CM | POA: Diagnosis not present

## 2018-03-05 DIAGNOSIS — Z889 Allergy status to unspecified drugs, medicaments and biological substances status: Secondary | ICD-10-CM | POA: Diagnosis not present

## 2018-03-05 NOTE — Progress Notes (Signed)
Subjective:    Patient ID: Emily Wagner, female    DOB: 1933/09/21, 84 y.o.   MRN: [id number]  HPI Pt here for follow up and management of chronic medical problems which includes hypertension, a fib and hyperlipidemia. She is taking medication regularly.  Patient today complains of some back pain and sciatica.  She also says that she has had some low blood pressure readings as low as 80/50.  She was given an FOBT to return.  Her vital signs in the office today and have a blood pressure is 148/88 and her weight is stable at 171 pounds.  She brings in blood pressures for review and they range from 115 up to 137/62-81.  The recent lab work has liver function tests that were all normal.  The blood sugar was slightly elevated at 106.  The creatinine was elevated at 1.14.  The most recent potassium that was dated 12/11/2017 when it was low but I think this is been rechecked since that time and the potassium was better.  CBC was within normal limits with a good hemoglobin normal white blood cell count and adequate platelet count.  The vitamin D remained excellent at 54.7.  The most recent BMP that was dated 02/25/2018 had a potassium that was 4.0.  The creatinine was elevated at 1.30.  Patient is very tearful and upset because of her limited financial resources and trying to purchase medicine that she needs.  We found that her potassium was low in the medicine that we called and was very expensive so we tried a different potassium and she says she broke out with a rash and had blisters under her breast so she had to stop this.  In the meantime her potassium was repeated and was improved up to 4.0.  In the meantime she is not taking any potassium currently.  We will speak to the clinical pharmacist and try to arrive at one that may be more hypoallergenic in nature.  She does not see the cardiologist again until September.  He sees her yearly.  She has been having these dizzy spells which sound like in her ear and  she did take some meclizine and this seemed to help the dizzy spells.  She denies any chest pain or shortness of breath.  She denies any trouble with nausea vomiting diarrhea blood in the stool or black tarry bowel movements and says she is passing her water well.  Her biggest complaint today is no energy she reemphasize that several times.  Outside blood pressures brought in for review will be scanned into the record.  We will make sure that we do get another BMP to follow-up on the potassium in a couple weeks and we will also make sure that we get a B12 level and a thyroid profile if that has not been done recently.    Patient Active Problem List   Diagnosis Date Noted  . Aortic atherosclerosis ([place]) 12/12/2017  . SOB (shortness of breath) 05/17/2017  . Long term (current) use of anticoagulants 11/11/2015  . Atrial fibrillation ([place]) 11/04/2015  . 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 Infection of urinary tract 10/31/2015  . Unstable angina ([place]) 10/29/2015  . CAD (coronary artery disease) 10/29/2015  . Benign hypertensive heart disease without heart failure   . Interstitial cystitis   . Osteopenia   . Pelvic fracture ([place]) 05/30/2015  . Allergy to multiple medications 07/15/2013  . Renal insufficiency   . Adjustment disorder with  mixed anxiety and depressed mood   . Osteoporosis   . ASCVD (arteriosclerotic cardiovascular disease)   . 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  . HYPERCHOLESTEROLEMIA 05/02/2008  . Essential hypertension 05/02/2008  . INTERSTITIAL CYSTITIS 05/02/2008  . DIVERTICULOSIS OF COLON 08/27/2003   Outpatient Encounter Medications as of 03/05/2018  Medication Sig  . amLODipine (NORVASC) 5 MG tablet Take 1 tablet (5 mg total) daily by mouth. (Patient taking differently: Take 5 mg by mouth daily as needed. )  . aspirin EC 81 MG tablet Take 81 mg by mouth daily.  . Cholecalciferol (VITAMIN D) 2000 UNITS CAPS Take 2,000 Units by mouth daily.   . Cyanocobalamin (VITAMIN B-12  PO) Take 5,000 mcg by mouth every morning.  . fexofenadine (ALLEGRA) 180 MG tablet Take 180 mg by mouth daily.  . hydrochlorothiazide (HYDRODIURIL) 25 MG tablet Take 1 tablet (25 mg total) by mouth daily.  Marland Kitchen levothyroxine (SYNTHROID, LEVOTHROID) 50 MCG tablet Take 1 tablet (50 mcg total) by mouth daily before breakfast.  . LORazepam (ATIVAN) 0.5 MG tablet TAKE 1 TABLET BY MOUTH TWICE A DAY AS NEEDED  . Multiple Vitamins-Minerals (PRESERVISION AREDS PO) Take 1 tablet by mouth every other day.   . ranitidine (ZANTAC) 150 MG tablet Take 150 mg by mouth 3 (three) times daily.  . [DISCONTINUED] potassium chloride (K-DUR) 10 MEQ tablet Take 1 tablet (10 mEq total) by mouth 2 (two) times daily.   No facility-administered encounter medications on file as of 03/05/2018.      Review of Systems  Constitutional: Negative.   HENT: Negative.   Eyes: Negative.   Respiratory: Negative.   Cardiovascular: Negative.        Some recent low BP readings  Gastrointestinal: Negative.   Endocrine: Negative.   Genitourinary: Negative.   Musculoskeletal: Positive for back pain (lower - nerve ).  Skin: Positive for rash (recent rash - from potassium ).  Allergic/Immunologic: Negative.   Neurological: Negative.   Hematological: Negative.   Psychiatric/Behavioral: Negative.        Objective:   Physical Exam  Constitutional: She is oriented to person, place, and time. She appears well-developed and well-nourished. She appears distressed.  The patient is very uptight and tearful today and crying because of expenses involved with taking care of her medicines and the fact that her insurance does not pay for it.  HENT:  Head: Normocephalic and atraumatic.  Right Ear: External ear normal.  Left Ear: External ear normal.  Mouth/Throat: No oropharyngeal exudate.  Nasal turbinate congestion bilaterally  Eyes: Pupils are equal, round, and reactive to light. Conjunctivae and EOM are normal. Right eye exhibits no  discharge. Left eye exhibits no discharge. No scleral icterus.  Neck: Normal range of motion. Neck supple. No thyromegaly present.  No bruits thyromegaly or anterior cervical adenopathy  Cardiovascular: Normal rate and normal heart sounds.  No murmur heard. Is irregular irregular at 84/min, distal pulses were difficult to palpate in either foot  Pulmonary/Chest: Effort normal and breath sounds normal. No respiratory distress. She has no wheezes. She has no rales.  Clear anteriorly and posteriorly  Abdominal: Soft. Bowel sounds are normal. She exhibits no mass. There is no tenderness. There is no rebound and no guarding.  No liver or spleen enlargement epigastric or suprapubic tenderness bruits or masses  Musculoskeletal: Normal range of motion. She exhibits no edema.  Patient moves slowly because of arthritic pain and hip pain.  Lymphadenopathy:    She has no cervical adenopathy.  Neurological: She is alert and  oriented to person, place, and time. No cranial nerve deficit.  Skin: Skin is warm and dry. No rash noted.  Psychiatric: She has a normal mood and affect. Her behavior is normal. Judgment and thought content normal.  Emotional today.  Nursing note and vitals reviewed.  BP (!) 148/88 (BP Location: Left Arm)   Pulse 84   Temp (!) 97.1 F (36.2 C) (Oral)   Ht '5\' 4"'$  (1.626 m)   Wt 171 lb (77.6 kg)   LMP 03/21/1971   BMI 29.35 kg/m    EKG with results pending===    Assessment & Plan:  1. Essential hypertension -The blood pressure today is 148/88.  Readings from home are better than this. - BMP8+EGFR - CBC with Differential/Platelet - EKG 12-Lead  2. Paroxysmal atrial fibrillation (HCC) -The patient is in atrial fibrillation at about 84/min - CBC with Differential/Platelet - EKG 12-Lead  3. Gastroesophageal reflux disease, esophagitis presence not specified -No complaints today with reflux - CBC with Differential/Platelet  4. ASCVD (arteriosclerotic cardiovascular  disease) -Continue follow-up with Dr. Percival Spanish as planned or sooner if we feel that is necessary - CBC with Differential/Platelet  5. Vitamin D deficiency -Continue with vitamin D replacement - CBC with Differential/Platelet  6. HYPERCHOLESTEROLEMIA -Continue with aggressive therapeutic lifestyle changes - CBC with Differential/Platelet  7. Hypokalemia -Check BMP today - CBC with Differential/Platelet  8. Multiple drug allergies - CBC with Differential/Platelet  9. Low serum potassium -BMP today - CBC with Differential/Platelet  10. Renal insufficiency - CBC with Differential/Platelet  11. Primary osteoarthritis of left knee -Only take Tylenol if needed for aches pains  - CBC with Differential/Platelet  12. Fatigue, unspecified type - Thyroid Panel With TSH - Vitamin B12 - EKG 12-Lead  Patient Instructions                       Medicare Annual Wellness Visit  [facility] and the medical providers at [facility] strive to bring you the best medical care.  In doing so we not only want to address your current medical conditions and concerns but also to detect new conditions early and prevent illness, disease and health-related problems.    Medicare offers a yearly Wellness Visit which allows our clinical staff to assess your need for preventative services including immunizations, lifestyle education, counseling to decrease risk of preventable diseases and screening for fall risk and other medical concerns.    This visit is provided free of charge (no copay) for all Medicare recipients. The clinical pharmacists at [facility] have begun to conduct these Wellness Visits which will also include a thorough review of all your medications.    As you primary medical provider recommend that you make an appointment for your Annual Wellness Visit if you have not done so already this year.  You may set up this appointment before you leave  today or you may call back ([phone]) and schedule an appointment.  Please make sure when you call that you mention that you are scheduling your Annual Wellness Visit with the clinical pharmacist so that the appointment may be made for the proper length of time.     Continue current medications. Continue good therapeutic lifestyle changes which include good diet and exercise. Fall precautions discussed with patient. If an FOBT was given today- please return it to our front desk. If you are over 28 years old - you may need Prevnar 25 or the adult Pneumonia vaccine.  **  Flu shots are available--- please call and schedule a FLU-CLINIC appointment**  After your visit with [name] today you will receive a survey in the mail or online from [employer] regarding your care with [name]. Please take a moment to fill this out. Your feedback is very important to [name] as you can help [name] better understand your patient needs as well as improve your experience and satisfaction. WE CARE ABOUT YOU!!!   We will discuss your problem with potassium and if there is a potassium that is lower and causing allergies and rashes.  We will discussed this with the clinical pharmacist and get her suggestions. Follow-up with cardiology as planned We will look for other causes of fatigue like B12 deficiency decreased hemoglobin and thyroid issues. Since she has had this low potassium and it is normal and you are off the potassium again we would ask that you repeat the BMP again in about 6 weeks.   Arrie Senate MD

## 2018-03-05 NOTE — Patient Instructions (Addendum)
Medicare Annual Wellness Visit  [facility] and the medical providers at [facility] strive to bring you the best medical care.  In doing so we not only want to address your current medical conditions and concerns but also to detect new conditions early and prevent illness, disease and health-related problems.    Medicare offers a yearly Wellness Visit which allows our clinical staff to assess your need for preventative services including immunizations, lifestyle education, counseling to decrease risk of preventable diseases and screening for fall risk and other medical concerns.    This visit is provided free of charge (no copay) for all Medicare recipients. The clinical pharmacists at [facility] have begun to conduct these Wellness Visits which will also include a thorough review of all your medications.    As you primary medical provider recommend that you make an appointment for your Annual Wellness Visit if you have not done so already this year.  You may set up this appointment before you leave today or you may call back ([phone]) and schedule an appointment.  Please make sure when you call that you mention that you are scheduling your Annual Wellness Visit with the clinical pharmacist so that the appointment may be made for the proper length of time.     Continue current medications. Continue good therapeutic lifestyle changes which include good diet and exercise. Fall precautions discussed with patient. If an FOBT was given today- please return it to our front desk. If you are over 82 years old - you may need Prevnar 51 or the adult Pneumonia vaccine.  **Flu shots are available--- please call and schedule a FLU-CLINIC appointment**  After your visit with [name] today you will receive a survey in the mail or online from [employer] regarding your care with [name]. Please take a moment to fill this out. Your feedback is very  important to [name] as you can help [name] better understand your patient needs as well as improve your experience and satisfaction. WE CARE ABOUT YOU!!!   We will discuss your problem with potassium and if there is a potassium that is lower and causing allergies and rashes.  We will discussed this with the clinical pharmacist and get her suggestions. Follow-up with cardiology as planned We will look for other causes of fatigue like B12 deficiency decreased hemoglobin and thyroid issues. Since she has had this low potassium and it is normal and you are off the potassium again we would ask that you repeat the BMP again in about 6 weeks.

## 2018-03-06 ENCOUNTER — Telehealth: Payer: Self-pay | Admitting: Family Medicine

## 2018-03-06 DIAGNOSIS — M5430 Sciatica, unspecified side: Secondary | ICD-10-CM | POA: Diagnosis not present

## 2018-03-06 DIAGNOSIS — M9903 Segmental and somatic dysfunction of lumbar region: Secondary | ICD-10-CM | POA: Diagnosis not present

## 2018-03-06 DIAGNOSIS — M9904 Segmental and somatic dysfunction of sacral region: Secondary | ICD-10-CM | POA: Diagnosis not present

## 2018-03-06 DIAGNOSIS — M9905 Segmental and somatic dysfunction of pelvic region: Secondary | ICD-10-CM | POA: Diagnosis not present

## 2018-03-06 LAB — BMP8+EGFR
BUN/Creatinine Ratio: 28 (ref 12–28)
BUN: 32 mg/dL — ABNORMAL HIGH (ref 8–27)
CALCIUM: 9.6 mg/dL (ref 8.7–10.3)
CHLORIDE: 102 mmol/L (ref 96–106)
CO2: 24 mmol/L (ref 20–29)
Creatinine, Ser: 1.14 mg/dL — ABNORMAL HIGH (ref 0.57–1.00)
GFR calc Af Amer: 52 mL/min/{1.73_m2} — ABNORMAL LOW (ref >59)
GFR, EST NON AFRICAN AMERICAN: 45 mL/min/{1.73_m2} — AB (ref >59)
Glucose: 88 mg/dL (ref 65–99)
POTASSIUM: 3.6 mmol/L (ref 3.5–5.2)
Sodium: 143 mmol/L (ref 134–144)

## 2018-03-06 LAB — CBC WITH DIFFERENTIAL/PLATELET
BASOS ABS: 0 10*3/uL (ref 0.0–0.2)
Basos: 0 %
EOS (ABSOLUTE): 0.2 10*3/uL (ref 0.0–0.4)
Eos: 3 %
Hematocrit: 44.6 % (ref 34.0–46.6)
Hemoglobin: 15.6 g/dL (ref 11.1–15.9)
IMMATURE GRANS (ABS): 0 10*3/uL (ref 0.0–0.1)
IMMATURE GRANULOCYTES: 0 %
LYMPHS: 23 %
Lymphocytes Absolute: 1.6 10*3/uL (ref 0.7–3.1)
MCH: 32 pg (ref 26.6–33.0)
MCHC: 35 g/dL (ref 31.5–35.7)
MCV: 92 fL (ref 79–97)
Monocytes Absolute: 0.7 10*3/uL (ref 0.1–0.9)
Monocytes: 10 %
NEUTROS PCT: 64 %
Neutrophils Absolute: 4.5 10*3/uL (ref 1.4–7.0)
PLATELETS: 230 10*3/uL (ref 150–379)
RBC: 4.87 x10E6/uL (ref 3.77–5.28)
RDW: 13.8 % (ref 12.3–15.4)
WBC: 7.1 10*3/uL (ref 3.4–10.8)

## 2018-03-06 LAB — VITAMIN B12: Vitamin B-12: >2000 pg/mL — ABNORMAL HIGH (ref 232–1245)

## 2018-03-06 LAB — THYROID PANEL WITH TSH
Free Thyroxine Index: 2.1 (ref 1.2–4.9)
T3 UPTAKE RATIO: 28 % (ref 24–39)
T4, Total: 7.6 ug/dL (ref 4.5–12.0)
TSH: 2.04 u[IU]/mL (ref 0.450–4.500)

## 2018-03-06 NOTE — Telephone Encounter (Signed)
Noted - we are waiting for Sharyn Lull to address this

## 2018-03-07 ENCOUNTER — Telehealth: Payer: Self-pay | Admitting: Family Medicine

## 2018-03-07 NOTE — Telephone Encounter (Signed)
appt scheduled Pt notified 

## 2018-03-08 ENCOUNTER — Ambulatory Visit: Payer: Medicare Other | Admitting: Cardiology

## 2018-03-08 ENCOUNTER — Ambulatory Visit: Payer: Medicare Other | Admitting: Family Medicine

## 2018-03-08 ENCOUNTER — Encounter: Payer: Self-pay | Admitting: Cardiology

## 2018-03-08 VITALS — BP 195/99 | HR 74 | Ht 64.0 in | Wt 171.6 lb

## 2018-03-08 DIAGNOSIS — R9431 Abnormal electrocardiogram [ECG] [EKG]: Secondary | ICD-10-CM | POA: Diagnosis not present

## 2018-03-08 DIAGNOSIS — R0602 Shortness of breath: Secondary | ICD-10-CM

## 2018-03-08 DIAGNOSIS — I10 Essential (primary) hypertension: Secondary | ICD-10-CM | POA: Diagnosis not present

## 2018-03-08 NOTE — Patient Instructions (Signed)
Medication Instructions:  Continue current medications  If you need a refill on your cardiac medications before your next appointment, please call your pharmacy.  Labwork: None Ordered   Testing/Procedures: None Ordered   Follow-Up: Your physician wants you to follow-up in: September 2019.   .   Thank you for choosing [facility] at [facility]!!

## 2018-03-08 NOTE — Progress Notes (Signed)
Cardiology Office Note   Date:  03/08/2018   ID:  Emily Wagner, DOB [birth year], MRN [id number]  PCP:  Emily Herb, MD  Cardiologist:   No primary care provider on file.   Chief Complaint  Patient presents with  . Abnormal ECG      History of Present Illness: Emily Wagner is a 84 y.o. female who presents for evaluation of atrial fib.     She has a history of  bypass surgery.     She was added to my schedule because at the last appt with Dr. Laurance Wagner she was thought to be in atria fib.   However, the EKG does not demonstrate atrial fib.  Instead it is sinus with PACs.  The patient denies any new symptoms such as chest discomfort, neck or arm discomfort. There has been no new shortness of breath, PND or orthopnea. There have been no reported palpitations, presyncope or syncope.   She is very weak and this has been chronic.  She is tearful and anxious.  She does some houselold chores. She has not been to the [facility] as she should be because she has back problems.  She does not have syncope or presyncope.  She has some chest pain associated with her sternal incision.    Past Medical History:  Diagnosis Date  . Adjustment disorder with mixed anxiety and depressed mood   . Atrial fibrillation ([place]) 11/04/2015   Post-operative  . Benign hypertensive heart disease without heart failure   . CAD (coronary artery disease)   . Infection of urinary tract 10/31/2015  . Interstitial cystitis   . Osteopenia   . Other and unspecified hyperlipidemia   . Other dyspnea and respiratory abnormality   . Pelvic fracture ([place]) 05/30/2015  . 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 ABDOMINAL HYSTERECTOMY     Cancer and endometriosis  . CARDIAC CATHETERIZATION  2008  . CARDIAC CATHETERIZATION N/A 10/29/2015   Procedure: Left Heart Cath and Coronary Angiography;  Surgeon: Belva Crome, MD;  Location: [facility];  Service:  Cardiovascular;  Laterality: N/A;  . CATARACT EXTRACTION    . CHOLECYSTECTOMY    . CORONARY ARTERY BYPASS GRAFT N/A 11/02/2015   Procedure: OFF PUMP CORONARY ARTERY BYPASS GRAFTING (CABG) TIMES ONE;  Surgeon: Rexene Alberts, MD;  Location: [facility];  Service: Open Heart Surgery;  Laterality: N/A;  LIMA to LAD  . FRACTURE SURGERY     Right hip  . HERNIA REPAIR     Right inguinal  . TEE WITHOUT CARDIOVERSION N/A 11/02/2015   Procedure: TRANSESOPHAGEAL ECHOCARDIOGRAM (TEE);  Surgeon: Rexene Alberts, MD;  Location: [facility];  Service: Open Heart Surgery;  Laterality: N/A;     Current Outpatient Medications  Medication Sig Dispense Refill  . amLODipine (NORVASC) 5 MG tablet Take 1 tablet (5 mg total) daily by mouth. (Patient taking differently: Take 5 mg by mouth daily as needed. ) 90 tablet 2  . aspirin EC 81 MG tablet Take 81 mg by mouth daily.    . Cholecalciferol (VITAMIN D) 2000 UNITS CAPS Take 2,000 Units by mouth daily.     . Cyanocobalamin (VITAMIN B-12 PO) Take 5,000 mcg by mouth every morning.    . fexofenadine (ALLEGRA) 180 MG tablet Take 180 mg by mouth daily.    . hydrochlorothiazide (HYDRODIURIL) 25 MG tablet Take 1 tablet (  25 mg total) by mouth daily. 90 tablet 3  . levothyroxine (SYNTHROID, LEVOTHROID) 50 MCG tablet Take 1 tablet (50 mcg total) by mouth daily before breakfast. 90 tablet 3  . LORazepam (ATIVAN) 0.5 MG tablet TAKE 1 TABLET BY MOUTH TWICE A DAY AS NEEDED 60 tablet 2  . Multiple Vitamins-Minerals (PRESERVISION AREDS PO) Take 1 tablet by mouth every other day.     . ranitidine (ZANTAC) 150 MG tablet Take 150 mg by mouth 3 (three) times daily.     No current facility-administered medications for this visit.     Allergies:   Nitrofurantoin; Nsaids; Other; Flonase [fluticasone propionate]; Prevnar 13 [pneumococcal 13-val conj vacc]; Beta adrenergic blockers; Potassium-containing compounds; Alendronate sodium; Ciprofloxacin; Codeine; Crestor [rosuvastatin calcium]; Darvon;  Lipitor [atorvastatin calcium]; Pravachol; Propranolol hcl; Sulfa antibiotics; Sulfamethoxazole; Ultram [tramadol hcl]; Welchol [colesevelam hcl]; Zetia [ezetimibe]; and Zocor [simvastatin]    ROS:  Please see the history of present illness.   Otherwise, review of systems are positive for none.   All other systems are reviewed and negative.    PHYSICAL EXAM: VS:  BP (!) 195/99   Pulse 74   Ht 5\' 4"  (1.626 m)   Wt 171 lb 9.6 oz (77.8 kg)   LMP 03/21/1971   BMI 29.46 kg/m  , BMI Body mass index is 29.46 kg/m.  GENERAL:  Well appearing NECK:  No jugular venous distention, waveform within normal limits, carotid upstroke brisk and symmetric, no bruits, no thyromegaly LUNGS:  Clear to auscultation bilaterally CHEST:  Well healed sternotomy scar. HEART:  PMI not displaced or sustained,S1 and S2 within normal limits, no S3, no S4, no clicks, no rubs, no murmurs ABD:  Flat, positive bowel sounds normal in frequency in pitch, no bruits, no rebound, no guarding, no midline pulsatile mass, no hepatomegaly, no splenomegaly EXT:  2 plus pulses throughout, no edema, no cyanosis no clubbing    EKG:  EKG is ordered today. The ekg ordered 02/02/17 demonstrates sinus rhythm, rate 70, premature atrial contractions, axis within normal limits, possible old inferior infarct.   Recent Labs: 12/11/2017: ALT 11 03/05/2018: BUN 32; Creatinine, Ser 1.14; Hemoglobin 15.6; Platelets 230; Potassium 3.6; Sodium 143; TSH 2.040    Lipid Panel    Component Value Date/Time   CHOL 215 (H) 09/29/2016 1006   TRIG 129 09/29/2016 1006   TRIG 162 (H) 06/24/2015 0918   HDL 42 09/29/2016 1006   HDL 44 06/24/2015 0918   CHOLHDL 5.1 (H) 09/29/2016 1006   CHOLHDL 5.4 10/31/2015 0215   VLDL 20 10/31/2015 0215   LDLCALC 147 (H) 09/29/2016 1006   LDLCALC 163 (H) 05/14/2014 1038      Wt Readings from Last 3 Encounters:  03/08/18 171 lb 9.6 oz (77.8 kg)  03/05/18 171 lb (77.6 kg)  12/11/17 172 lb (78 kg)      Other  studies Reviewed: Additional studies/ records that were reviewed today include: Office records/EKG. Review of the above records demonstrates:  Please see elsewhere in the note.     ASSESSMENT AND PLAN:  ARRHYTHMIA:    She had immature atrial contractions but there is no evidence of atrial fibrillation.  She does not feel these.  No change in therapy is indicated.  She was added on to my schedule to discuss this and she was very distressed but is relieved now.   CAD:  The patient has no new sypmtoms.  No further cardiovascular testing is indicated.  We will continue with aggressive risk reduction and meds as  listed.    DYSLIPIDEMIA:  She has excellent lipid profile.  No change in therapy.    HTN:  Her BP is elevated.  However, that is unusual.  She is stressed and tearful.  She has been very sensitive to medications.  At this point I do not think that I will change her meds but she needs to keep a BP diary.     Current medicines are reviewed at length with the patient today.  The patient does not have concerns regarding medicines.  The following changes have been made:  no change  Labs/ tests ordered today include: None No orders of the defined types were placed in this encounter.    Disposition:   FU with me in Sept as previously planned    Signed, Minus Breeding, MD  03/08/2018 10:30 AM    [place]

## 2018-03-13 ENCOUNTER — Ambulatory Visit (INDEPENDENT_AMBULATORY_CARE_PROVIDER_SITE_OTHER): Payer: Medicare Other | Admitting: Family Medicine

## 2018-03-13 ENCOUNTER — Ambulatory Visit (INDEPENDENT_AMBULATORY_CARE_PROVIDER_SITE_OTHER): Payer: Medicare Other

## 2018-03-13 ENCOUNTER — Other Ambulatory Visit: Payer: Medicare Other

## 2018-03-13 ENCOUNTER — Encounter: Payer: Self-pay | Admitting: Family Medicine

## 2018-03-13 VITALS — BP 160/96 | HR 83 | Temp 98.6°F | Ht 64.0 in | Wt 171.0 lb

## 2018-03-13 DIAGNOSIS — Z1211 Encounter for screening for malignant neoplasm of colon: Secondary | ICD-10-CM

## 2018-03-13 DIAGNOSIS — M25562 Pain in left knee: Secondary | ICD-10-CM

## 2018-03-13 DIAGNOSIS — S8992XA Unspecified injury of left lower leg, initial encounter: Secondary | ICD-10-CM | POA: Diagnosis not present

## 2018-03-13 MED ORDER — METHYLPREDNISOLONE ACETATE 80 MG/ML IJ SUSP
80.0000 mg | Freq: Once | INTRAMUSCULAR | Status: AC
  Administered 2018-03-13: 80 mg via INTRAMUSCULAR

## 2018-03-13 NOTE — Progress Notes (Signed)
BP (!) 160/96   Pulse 83   Temp 98.6 F (37 C) (Oral)   Ht 5\' 4"  (1.626 m)   Wt 171 lb (77.6 kg)   LMP 03/21/1971   BMI 29.35 kg/m    Subjective:    Patient ID: Emily Wagner, female    DOB: [birth year], 84 y.o.   MRN: [id number]  HPI: Emily Wagner is a 84 y.o. female presenting on 03/13/2018 for Left knee pain   HPI Left knee pain Patient is coming in with complaints of left knee pain that is been bothering her about a week.  She says she is had arthritis in the knee before and has not had an injection before but recently just about a week ago she had a fall onto the front of her left knee and has been having pain since that time in that left knee.  She has had a little bit of swelling but denies any redness or warmth.  She does have pain with standing or weight bearing on it.  The pain with range of motion is minimal but it does hurt to palpation on that medial aspect of her left knee.  Relevant past medical, surgical, family and social history reviewed and updated as indicated. Interim medical history since our last visit reviewed. Allergies and medications reviewed and updated.  Review of Systems  Constitutional: Negative for chills and fever.  Respiratory: Negative for chest tightness and shortness of breath.   Cardiovascular: Negative for chest pain and leg swelling.  Musculoskeletal: Positive for arthralgias and joint swelling. Negative for back pain and gait problem.  Skin: Negative for color change and rash.  Neurological: Negative for light-headedness and headaches.  Psychiatric/Behavioral: Negative for agitation and behavioral problems.  All other systems reviewed and are negative.   Per HPI unless specifically indicated above   Allergies as of 03/13/2018      Reactions   Nitrofurantoin Other (See Comments)   Liver failure   Nsaids Other (See Comments)   Kidney failure   Other Other (See Comments)   Most antibiotics require supervision per patient   Flonase [fluticasone Propionate] Other (See Comments)   headache   Prevnar 13 [pneumococcal 13-val Conj Vacc] Other (See Comments)   Nerve damage in left arm   Beta Adrenergic Blockers    Potassium-containing Compounds    Rash and raw areas / sore breast    Alendronate Sodium Other (See Comments)   Ciprofloxacin Other (See Comments)   Patient denies allergy--"I take Cipro for bladder infections"   Codeine Other (See Comments)   Crestor [rosuvastatin Calcium] Other (See Comments)   Darvon Other (See Comments)   Lipitor [atorvastatin Calcium] Other (See Comments)   Pravachol Other (See Comments)   Propranolol Hcl Other (See Comments)   Unknown   Sulfa Antibiotics Other (See Comments)   Sulfamethoxazole Other (See Comments)   Unknown   Ultram [tramadol Hcl] Itching   Welchol [colesevelam Hcl] Other (See Comments)   Zetia [ezetimibe] Other (See Comments)   Zocor [simvastatin] Other (See Comments)      Medication List        Accurate as of 03/13/18  3:02 PM. Always use your most recent med list.          amLODipine 5 MG tablet Commonly known as:  NORVASC Take 1 tablet (5 mg total) daily by mouth.   aspirin EC 81 MG tablet Take 81 mg by mouth daily.   fexofenadine 180 MG tablet Commonly known  as:  ALLEGRA Take 180 mg by mouth daily.   hydrochlorothiazide 25 MG tablet Commonly known as:  HYDRODIURIL Take 1 tablet (25 mg total) by mouth daily.   levothyroxine 50 MCG tablet Commonly known as:  SYNTHROID, LEVOTHROID Take 1 tablet (50 mcg total) by mouth daily before breakfast.   LORazepam 0.5 MG tablet Commonly known as:  ATIVAN TAKE 1 TABLET BY MOUTH TWICE A DAY AS NEEDED   PRESERVISION AREDS PO Take 1 tablet by mouth every other day.   ranitidine 150 MG tablet Commonly known as:  ZANTAC Take 150 mg by mouth 3 (three) times daily.   VITAMIN B-12 PO Take 5,000 mcg by mouth every morning.   Vitamin D 2000 units Caps Take 2,000 Units by mouth daily.           Objective:    BP (!) 160/96   Pulse 83   Temp 98.6 F (37 C) (Oral)   Ht 5\' 4"  (1.626 m)   Wt 171 lb (77.6 kg)   LMP 03/21/1971   BMI 29.35 kg/m   Wt Readings from Last 3 Encounters:  03/13/18 171 lb (77.6 kg)  03/08/18 171 lb 9.6 oz (77.8 kg)  03/05/18 171 lb (77.6 kg)    Physical Exam  Constitutional: She is oriented to person, place, and time. She appears well-developed and well-nourished. No distress.  Eyes: Conjunctivae are normal.  Cardiovascular: Normal rate, regular rhythm, normal heart sounds and intact distal pulses.  No murmur heard. Pulmonary/Chest: Effort normal and breath sounds normal. No respiratory distress. She has no wheezes.  Musculoskeletal: Normal range of motion.       Left knee: She exhibits swelling and effusion. She exhibits normal range of motion, no ecchymosis, no deformity, normal alignment, no LCL laxity, normal patellar mobility, normal meniscus and no MCL laxity. Tenderness found. Medial joint line (Tender to palpation on medial left knee) tenderness noted.  Neurological: She is alert and oriented to person, place, and time. Coordination normal.  Skin: Skin is warm and dry. No rash noted. She is not diaphoretic.  Psychiatric: She has a normal mood and affect. Her behavior is normal.  Nursing note and vitals reviewed.   Left knee x-ray: No signs of acute bony abnormality, await final read from radiology  Knee injection: Verbal consent obtained. Risk factors of bleeding and infection discussed with patient and patient is agreeable towards injection. Patient prepped with Betadine. Lateral approach towards injection used. Injected 80 mg of Depo-Medrol and 1 mL of 2% lidocaine. Patient tolerated procedure well and no side effects from noted. Minimal to no bleeding. Simple bandage applied after.     Assessment & Plan:   Problem List Items Addressed This Visit    None    Visit Diagnoses    Acute pain of left knee    -  Primary   Patient has had  arthritis in her left knee before but recently had a fall and flared back up   Relevant Medications   methylPREDNISolone acetate (DEPO-MEDROL) injection 80 mg (Start on 03/13/2018  3:45 PM)   Other Relevant Orders   DG Knee 1-2 Views Left       Follow up plan: Return if symptoms worsen or fail to improve.  Counseling provided for all of the vaccine components Orders Placed This Encounter  Procedures  . DG Knee 1-2 Views Left    Caryl Pina, MD [facility] 03/13/2018, 3:02 PM

## 2018-03-13 NOTE — Addendum Note (Signed)
Addended by: Zannie Cove on: 03/13/2018 04:44 PM   Modules accepted: Orders

## 2018-03-15 LAB — FECAL OCCULT BLOOD, IMMUNOCHEMICAL: Fecal Occult Bld: POSITIVE — AB

## 2018-03-18 ENCOUNTER — Telehealth: Payer: Self-pay | Admitting: Family Medicine

## 2018-03-18 ENCOUNTER — Telehealth: Payer: Self-pay | Admitting: *Deleted

## 2018-03-18 ENCOUNTER — Other Ambulatory Visit: Payer: Self-pay | Admitting: *Deleted

## 2018-03-18 DIAGNOSIS — R195 Other fecal abnormalities: Secondary | ICD-10-CM

## 2018-03-18 DIAGNOSIS — M25562 Pain in left knee: Secondary | ICD-10-CM

## 2018-03-18 NOTE — Telephone Encounter (Signed)
Please check with her insurance and try to make them understand that an orthopedic surgeon actually comes to our office and that she has been injected twice and still has problems with her joint pain and needs to have a specialist to do this that does not cost and arm and leg.

## 2018-03-18 NOTE — Telephone Encounter (Signed)
Patient states that the shot in her knee has not helped at all and she wants to know if she can have another one? Patient states that the pain is worse thatn when she had heart surgery.

## 2018-03-18 NOTE — Telephone Encounter (Signed)
Please schedule visit with orthopedic surgeon this week when he comes to this office.

## 2018-03-18 NOTE — Telephone Encounter (Signed)
Pt would rather not spend the $50 co-pay if she can help it. She says you done one and it worked. Dr [name] done the last one and it didn't help much.  Please advise if you can do this?

## 2018-03-18 NOTE — Telephone Encounter (Signed)
She really needs to see the orthopedist he may can do a better job than either 1 of [name] have done so that the pain can be remedied for a longer period of time

## 2018-03-18 NOTE — Telephone Encounter (Signed)
Pt aware and she will think about the Ortho appt and call us back

## 2018-03-19 ENCOUNTER — Telehealth: Payer: Self-pay | Admitting: Family Medicine

## 2018-03-19 NOTE — Telephone Encounter (Signed)
Pt called this morning = she does want appt

## 2018-03-19 NOTE — Telephone Encounter (Signed)
Pt aware - referral to [facility] here was placed and she will get a call from appt

## 2018-03-20 ENCOUNTER — Telehealth: Payer: Self-pay | Admitting: Family Medicine

## 2018-03-20 NOTE — Telephone Encounter (Signed)
Noted  

## 2018-03-20 NOTE — Telephone Encounter (Signed)
Per carlon - pt is aware of ortho visit tomorrow

## 2018-03-20 NOTE — Telephone Encounter (Signed)
Patient states she per Roselyn Reef and [facility] that she is supposed to see Ortho in [facility] Thursday. Per referral dept it is still being worked on - patient aware that referrals can take up to a week unless it is STAT. Patient states she would like Roselyn Reef to call her.

## 2018-03-21 DIAGNOSIS — M25562 Pain in left knee: Secondary | ICD-10-CM | POA: Diagnosis not present

## 2018-03-22 ENCOUNTER — Other Ambulatory Visit: Payer: Medicare Other

## 2018-03-22 DIAGNOSIS — R195 Other fecal abnormalities: Secondary | ICD-10-CM | POA: Diagnosis not present

## 2018-03-22 LAB — CBC WITH DIFFERENTIAL/PLATELET
BASOS ABS: 0 10*3/uL (ref 0.0–0.2)
Basos: 0 %
EOS (ABSOLUTE): 0.1 10*3/uL (ref 0.0–0.4)
Eos: 1 %
Hematocrit: 45.2 % (ref 34.0–46.6)
Hemoglobin: 15.6 g/dL (ref 11.1–15.9)
IMMATURE GRANS (ABS): 0 10*3/uL (ref 0.0–0.1)
IMMATURE GRANULOCYTES: 0 %
LYMPHS: 16 %
Lymphocytes Absolute: 1.1 10*3/uL (ref 0.7–3.1)
MCH: 31.5 pg (ref 26.6–33.0)
MCHC: 34.5 g/dL (ref 31.5–35.7)
MCV: 91 fL (ref 79–97)
Monocytes Absolute: 0.4 10*3/uL (ref 0.1–0.9)
Monocytes: 6 %
NEUTROS ABS: 5.2 10*3/uL (ref 1.4–7.0)
NEUTROS PCT: 77 %
PLATELETS: 264 10*3/uL (ref 150–379)
RBC: 4.95 x10E6/uL (ref 3.77–5.28)
RDW: 13.5 % (ref 12.3–15.4)
WBC: 6.8 10*3/uL (ref 3.4–10.8)

## 2018-03-25 ENCOUNTER — Encounter (HOSPITAL_COMMUNITY): Payer: Self-pay | Admitting: Emergency Medicine

## 2018-03-25 ENCOUNTER — Emergency Department (HOSPITAL_COMMUNITY)
Admission: EM | Admit: 2018-03-25 | Discharge: 2018-03-25 | Disposition: A | Discharge status: Home / Self Care | Payer: Medicare Other | Attending: Emergency Medicine | Admitting: Emergency Medicine

## 2018-03-25 ENCOUNTER — Emergency Department (HOSPITAL_COMMUNITY): Payer: Medicare Other

## 2018-03-25 DIAGNOSIS — Z79899 Other long term (current) drug therapy: Secondary | ICD-10-CM | POA: Insufficient documentation

## 2018-03-25 DIAGNOSIS — I2510 Atherosclerotic heart disease of native coronary artery without angina pectoris: Secondary | ICD-10-CM | POA: Diagnosis not present

## 2018-03-25 DIAGNOSIS — M25562 Pain in left knee: Secondary | ICD-10-CM | POA: Diagnosis not present

## 2018-03-25 DIAGNOSIS — M7989 Other specified soft tissue disorders: Secondary | ICD-10-CM | POA: Diagnosis not present

## 2018-03-25 DIAGNOSIS — Z7982 Long term (current) use of aspirin: Secondary | ICD-10-CM | POA: Insufficient documentation

## 2018-03-25 DIAGNOSIS — I10 Essential (primary) hypertension: Secondary | ICD-10-CM | POA: Insufficient documentation

## 2018-03-25 DIAGNOSIS — Z951 Presence of aortocoronary bypass graft: Secondary | ICD-10-CM | POA: Insufficient documentation

## 2018-03-25 LAB — SYNOVIAL CELL COUNT + DIFF, W/ CRYSTALS
CRYSTALS FLUID: NONE SEEN
Eosinophils-Synovial: 0 % (ref 0–1)
Lymphocytes-Synovial Fld: 0 % (ref 0–20)
MONOCYTE-MACROPHAGE-SYNOVIAL FLUID: 40 % — AB (ref 50–90)
Neutrophil, Synovial: 14 % (ref 0–25)
Other Cells-SYN: 0
WBC, Synovial: 30 /mm3 (ref 0–200)

## 2018-03-25 MED ORDER — LIDOCAINE HCL (PF) 1 % IJ SOLN
5.0000 mL | Freq: Once | INTRAMUSCULAR | Status: AC
  Administered 2018-03-25: 5 mL via INTRADERMAL
  Filled 2018-03-25: qty 6

## 2018-03-25 MED ORDER — BETAMETHASONE SOD PHOS & ACET 6 (3-3) MG/ML IJ SUSP
6.0000 mg | Freq: Once | INTRAMUSCULAR | Status: AC
  Administered 2018-03-25: 6 mg via INTRA_ARTICULAR
  Filled 2018-03-25: qty 1

## 2018-03-25 NOTE — ED Triage Notes (Signed)
Pt reports chronic left knee pain.  States she has been seen by her pcp and urgent care and given 2 injections into her knee with no relief.  Referred to ortho and went, but the doctor arrived late and left early and did not see her.

## 2018-03-25 NOTE — Discharge Instructions (Addendum)
As discussed, your knee pain should improve in the coming hours and days. However, it is still important he follow-up with orthopedic physician for continued ongoing long-term therapy.  Today's lab results from the fluid taken from your knee swelling are pending. If notable abnormalities are found you will be contacted.

## 2018-03-25 NOTE — ED Provider Notes (Signed)
[facility] EMERGENCY DEPARTMENT Provider Note   CSN: [id number] Arrival date & time: 03/25/18  1111     History   Chief Complaint Chief Complaint  Patient presents with  . Knee Pain    HPI Emily Wagner is a 84 y.o. female.  HPI Patient presents with worsening knee pain. Patient has a long history of chronic pain in her extremities, and diagnosis of osteoarthritis in the left knee. She has received lidocaine as recently as 2 weeks ago, but notes that her pain is becoming worse in spite of that recent injection. No other new complaints including fever, chills, warmth of the knee, distal loss of sensation or strength. She is intolerant of most oral medication due to allergies.  Past Medical History:  Diagnosis Date  . Adjustment disorder with mixed anxiety and depressed mood   . Atrial fibrillation ([place]) 11/04/2015   Post-operative  . Benign hypertensive heart disease without heart failure   . CAD (coronary artery disease)   . Infection of urinary tract 10/31/2015  . Interstitial cystitis   . Osteopenia   . Other and unspecified hyperlipidemia   . Other dyspnea and respiratory abnormality   . Pelvic fracture ([place]) 05/30/2015  . 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 Aortic atherosclerosis ([place]) 12/12/2017  . SOB (shortness of breath) 05/17/2017  . Long term (current) use of anticoagulants 11/11/2015  . Atrial fibrillation ([place]) 11/04/2015  . 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 Infection of urinary tract 10/31/2015  . Unstable angina ([place]) 10/29/2015  . CAD (coronary artery disease) 10/29/2015  . Benign hypertensive heart disease without heart failure   . Interstitial cystitis   . Osteopenia   . Pelvic fracture ([address]) 05/30/2015  . Allergy to multiple medications 07/15/2013  . Renal insufficiency   . Adjustment disorder with mixed anxiety and depressed mood   . Osteoporosis   . ASCVD  (arteriosclerotic cardiovascular disease)   . 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  . HYPERCHOLESTEROLEMIA 05/02/2008  . Essential hypertension 05/02/2008  . INTERSTITIAL CYSTITIS 05/02/2008  . DIVERTICULOSIS OF COLON 08/27/2003    Past Surgical History:  Procedure Laterality Date  . ABDOMINAL HYSTERECTOMY     Cancer and endometriosis  . CARDIAC CATHETERIZATION  2008  . CARDIAC CATHETERIZATION N/A 10/29/2015   Procedure: Left Heart Cath and Coronary Angiography;  Surgeon: Belva Crome, MD;  Location: [facility];  Service: Cardiovascular;  Laterality: N/A;  . CATARACT EXTRACTION    . CHOLECYSTECTOMY    . CORONARY ARTERY BYPASS GRAFT N/A 11/02/2015   Procedure: OFF PUMP CORONARY ARTERY BYPASS GRAFTING (CABG) TIMES ONE;  Surgeon: Rexene Alberts, MD;  Location: [facility];  Service: Open Heart Surgery;  Laterality: N/A;  LIMA to LAD  . FRACTURE SURGERY     Right hip  . HERNIA REPAIR     Right inguinal  . TEE WITHOUT CARDIOVERSION N/A 11/02/2015   Procedure: TRANSESOPHAGEAL ECHOCARDIOGRAM (TEE);  Surgeon: Rexene Alberts, MD;  Location: [facility];  Service: Open Heart Surgery;  Laterality: N/A;     OB History   None      Home Medications    Prior to Admission medications   Medication Sig Start Date End Date Taking? Authorizing Provider  aspirin EC 81 MG tablet Take 81 mg by mouth daily.   Yes [provider]  Cholecalciferol (VITAMIN D) 2000 UNITS CAPS Take 2,000 Units  by mouth daily.    Yes [provider]  Cyanocobalamin (VITAMIN B-12 PO) Take 5,000 mcg by mouth every morning.   Yes [provider]  fexofenadine (ALLEGRA) 180 MG tablet Take 180 mg by mouth daily.   Yes [provider]  hydrochlorothiazide (HYDRODIURIL) 25 MG tablet Take 1 tablet (25 mg total) by mouth daily. 12/11/17  Yes Chipper Herb, MD  levothyroxine (SYNTHROID, LEVOTHROID) 50 MCG tablet Take 1 tablet (50 mcg total) by mouth daily before breakfast. 10/30/17  Yes Chipper Herb,  MD  LORazepam (ATIVAN) 0.5 MG tablet TAKE 1 TABLET BY MOUTH TWICE A DAY AS NEEDED 01/28/18  Yes Chipper Herb, MD  Multiple Vitamins-Minerals (PRESERVISION AREDS PO) Take 1 tablet by mouth every other day.    Yes [provider]  ranitidine (ZANTAC) 150 MG tablet Take 150 mg by mouth 3 (three) times daily.   Yes [provider]  amLODipine (NORVASC) 5 MG tablet Take 1 tablet (5 mg total) daily by mouth. Patient taking differently: Take 5 mg by mouth daily as needed.  10/12/17   Chipper Herb, MD    Family History Family History  Problem Relation Age of Onset  . CAD Mother 75       Died suddenly  . CAD Father 32       Died suddenly  . CAD Daughter 32  . Diabetes type I Daughter        Since age 2    Social History Social History   Tobacco Use  . Smoking status: Never Smoker  . Smokeless tobacco: Never Used  Substance Use Topics  . Alcohol use: No  . Drug use: No     Allergies   Nitrofurantoin; Nsaids; Other; Flonase [fluticasone propionate]; Prevnar 13 [pneumococcal 13-val conj vacc]; Beta adrenergic blockers; Potassium-containing compounds; Alendronate sodium; Ciprofloxacin; Codeine; Crestor [rosuvastatin calcium]; Darvon; Lipitor [atorvastatin calcium]; Pravachol; Propranolol hcl; Sulfa antibiotics; Sulfamethoxazole; Ultram [tramadol hcl]; Welchol [colesevelam hcl]; Zetia [ezetimibe]; and Zocor [simvastatin]   Review of Systems Review of Systems  Constitutional:       Per HPI, otherwise negative  HENT:       Per HPI, otherwise negative  Respiratory:       Per HPI, otherwise negative  Cardiovascular:       Per HPI, otherwise negative  Gastrointestinal: Negative for vomiting.  Endocrine:       Negative aside from HPI  Genitourinary:       Neg aside from HPI   Musculoskeletal:       Per HPI, otherwise negative  Skin: Negative for color change and rash.  Neurological: Negative for syncope.     Physical Exam Updated Vital Signs BP (!)  163/105 (BP Location: Right Arm)   Pulse 84   Temp 98.1 F (36.7 C) (Oral)   Resp 18   Ht 5\' 4"  (1.626 m)   Wt 77.6 kg (171 lb)   LMP 03/21/1971   SpO2 100%   BMI 29.35 kg/m   Physical Exam  Constitutional: She is oriented to person, place, and time. She appears well-developed and well-nourished. No distress.  HENT:  Head: Normocephalic and atraumatic.  Eyes: Conjunctivae and EOM are normal.  Cardiovascular: Normal rate and regular rhythm.  Pulmonary/Chest: Effort normal and breath sounds normal. No stridor. No respiratory distress.  Abdominal: She exhibits no distension.  Musculoskeletal: She exhibits no edema.       Left knee: She exhibits no effusion. Tenderness found. Medial joint line tenderness noted.  Left ankle: Normal.  Neurological: She is alert and oriented to person, place, and time. No cranial nerve deficit.  Skin: Skin is warm and dry.  Psychiatric: She has a normal mood and affect.  Nursing note and vitals reviewed.  After the initial evaluation I reviewed the patient's chart including documentation from recent lidocaine injections at her family practice office, and unsuccessful attempts to have follow-up with orthopedics.  ED Treatments / Results  Labs (all labs ordered are listed, but only abnormal results are displayed) Labs Reviewed - No data to display  EKG None  Radiology Dg Knee Complete 4 Views Left  Result Date: 03/25/2018 CLINICAL DATA:  Progressive left knee pain and swelling. EXAM: LEFT KNEE - COMPLETE 4+ VIEW COMPARISON:  Radiographs dated 03/13/2018 FINDINGS: No evidence of fracture, dislocation, or joint effusion. There is medial joint space narrowing with small marginal osteophytes in the patellofemoral and lateral compartments. IMPRESSION: No acute bone abnormality. No joint effusion. Slight arthritic changes, stable. Electronically Signed   By: Lorriane Shire M.D.   On: 03/25/2018 12:04    Procedures .Joint  Aspiration/Arthrocentesis Date/Time: 03/25/2018 2:43 PM Performed by: Carmin Muskrat, MD Authorized by: Carmin Muskrat, MD   Consent:    Consent obtained:  Verbal   Consent given by:  Patient   Risks discussed:  Bleeding, incomplete drainage, nerve damage, infection and pain   Alternatives discussed:  No treatment and alternative treatment Location:    Location:  Knee   Knee:  L knee Anesthesia (see MAR for exact dosages):    Anesthesia method:  Local infiltration   Local anesthetic:  Lidocaine 1% w/o epi Procedure details:    Preparation: Patient was prepped and draped in usual sterile fashion     Needle gauge:  18 G   Ultrasound guidance: no     Approach:  Lateral   Aspirate amount:  4   Aspirate characteristics:  Yellow and blood-tinged   Steroid injected: yes     Specimen collected: yes   Post-procedure details:    Dressing:  Adhesive bandage   Patient tolerance of procedure:  Tolerated well, no immediate complications   (including critical care time)  Medications Ordered in ED Medications - No data to display   Initial Impression / Assessment and Plan / ED Course  I have reviewed the triage vital signs and the nursing notes.  Pertinent labs & imaging results that were available during my care of the patient were reviewed by me and considered in my medical decision making (see chart for details).  Early female presents with acute exacerbation of her chronic left knee pain. Patient is awake, alert, has no fever, no warmth to touch, and given her history, septic arthritis is low probability. However, given the patient's absence of improvement with injections of lidocaine, prior improvement with steroid, the patient had arthrocentesis performed with injection of steroid. This was well-tolerated, and absent other complaints, patient was discharged in stable condition to follow-up with orthopedics per On discharge lab results are pending. Patient will be made aware of  substantial abnormalities.  Final Clinical Impressions(s) / ED Diagnoses   Final diagnoses:  Acute pain of left knee     Carmin Muskrat, MD 03/25/18 1445

## 2018-03-26 ENCOUNTER — Telehealth: Payer: Self-pay | Admitting: Family Medicine

## 2018-03-26 LAB — URIC ACID, BODY FLUID: Uric Acid Body Fluid: 6 mg/dL

## 2018-03-27 NOTE — Telephone Encounter (Signed)
LM 5/1-jhb

## 2018-03-27 NOTE — Telephone Encounter (Signed)
Pt aware.

## 2018-04-03 ENCOUNTER — Other Ambulatory Visit: Payer: Medicare Other

## 2018-04-03 DIAGNOSIS — Z1211 Encounter for screening for malignant neoplasm of colon: Secondary | ICD-10-CM

## 2018-04-04 LAB — FECAL OCCULT BLOOD, IMMUNOCHEMICAL: Fecal Occult Bld: NEGATIVE

## 2018-04-09 ENCOUNTER — Telehealth: Payer: Self-pay | Admitting: Family Medicine

## 2018-04-09 NOTE — Telephone Encounter (Signed)
PT is needing Roselyn Reef to put in order to check her potassium and creatine states that her BP is low, yesterday around 120/60 and light head and going to bathroom a lot.

## 2018-04-09 NOTE — Telephone Encounter (Signed)
Patient aware Roselyn Reef will return 5/15- would like her to give her a call. States she is not light headed today and will call us back if she becomes light headed today.

## 2018-04-10 ENCOUNTER — Ambulatory Visit: Payer: Medicare Other | Admitting: Family Medicine

## 2018-04-15 ENCOUNTER — Telehealth: Payer: Self-pay | Admitting: Cardiology

## 2018-04-15 NOTE — Telephone Encounter (Signed)
Returned the call to the patient. She stated that her blood pressure has been fluctuating for the past several weeks now and she has no energy. She stated that yesterday after church it was 87/67 and a heart rate of 92. Today it was 146/96 and heart rate was 72. She had not taken her hydrochlorothiazide this morning. She has been advised to take her medication this morning and try to rest. She declined an appointment. She stated that she just wanted to make Dr. Percival Spanish aware.

## 2018-04-15 NOTE — Telephone Encounter (Signed)
Pt c/o BP issue: STAT if pt c/o blurred vision, one-sided weakness or slurred speech  1. What are your last 5 BP readings? 146/96 this morning and yesterday after church it was 87/67  2. Are you having any other symptoms (ex. Dizziness, headache, blurred vision, passed out)? Lightheaded and heart  Is skipping-no energy  3. What is your BP issue? Wants to know why it is up and than down

## 2018-04-18 DIAGNOSIS — M1712 Unilateral primary osteoarthritis, left knee: Secondary | ICD-10-CM | POA: Diagnosis not present

## 2018-04-19 NOTE — Telephone Encounter (Signed)
She should keep a BP diary.  Take three times daily for two weeks and send the report.

## 2018-04-23 NOTE — Telephone Encounter (Signed)
called pt and advised her of Dr Percival Spanish recommendation, pt voice understanding and will drop off BP reading in two weeks

## 2018-04-26 ENCOUNTER — Other Ambulatory Visit: Payer: Self-pay | Admitting: Family Medicine

## 2018-04-30 DIAGNOSIS — L03032 Cellulitis of left toe: Secondary | ICD-10-CM | POA: Diagnosis not present

## 2018-05-20 ENCOUNTER — Telehealth: Payer: Self-pay | Admitting: Family Medicine

## 2018-05-20 ENCOUNTER — Other Ambulatory Visit: Payer: Self-pay | Admitting: *Deleted

## 2018-05-20 DIAGNOSIS — W57XXXA Bitten or stung by nonvenomous insect and other nonvenomous arthropods, initial encounter: Secondary | ICD-10-CM

## 2018-05-20 DIAGNOSIS — E876 Hypokalemia: Secondary | ICD-10-CM

## 2018-05-20 NOTE — Telephone Encounter (Signed)
Pt aware we will place future orders

## 2018-05-21 ENCOUNTER — Other Ambulatory Visit: Payer: Medicare Other

## 2018-05-21 DIAGNOSIS — W57XXXA Bitten or stung by nonvenomous insect and other nonvenomous arthropods, initial encounter: Secondary | ICD-10-CM | POA: Diagnosis not present

## 2018-05-21 DIAGNOSIS — E876 Hypokalemia: Secondary | ICD-10-CM | POA: Diagnosis not present

## 2018-05-23 ENCOUNTER — Other Ambulatory Visit: Payer: Self-pay | Admitting: *Deleted

## 2018-05-23 ENCOUNTER — Telehealth: Payer: Self-pay | Admitting: Family Medicine

## 2018-05-23 MED ORDER — DOXYCYCLINE HYCLATE 100 MG PO TABS: 100.0000 mg | ORAL_TABLET | Freq: Two times a day (BID) | ORAL | 0 refills | Status: AC

## 2018-05-23 MED ORDER — FLUCONAZOLE 150 MG PO TABS: 150.0000 mg | ORAL_TABLET | Freq: Once | ORAL | 1 refills | Status: AC

## 2018-05-23 NOTE — Telephone Encounter (Signed)
Pt aware of labs and meds

## 2018-05-24 LAB — LYME, WESTERN BLOT, SERUM (REFLEXED)
IGG P18 AB.: ABSENT
IGG P23 AB.: ABSENT
IGG P28 AB.: ABSENT
IGG P39 AB.: ABSENT
IGG P93 AB.: ABSENT
IGM P23 AB.: ABSENT
IgG P30 Ab.: ABSENT
IgG P41 Ab.: ABSENT
IgG P45 Ab.: ABSENT
IgG P58 Ab.: ABSENT
IgG P66 Ab.: ABSENT
IgM P39 Ab.: ABSENT
IgM P41 Ab.: ABSENT
LYME IGM WB: NEGATIVE
Lyme IgG Wb: NEGATIVE

## 2018-05-24 LAB — BMP8+EGFR
BUN/Creatinine Ratio: 17 (ref 12–28)
BUN: 19 mg/dL (ref 8–27)
CALCIUM: 9.3 mg/dL (ref 8.7–10.3)
CO2: 23 mmol/L (ref 20–29)
Chloride: 95 mmol/L — ABNORMAL LOW (ref 96–106)
Creatinine, Ser: 1.13 mg/dL — ABNORMAL HIGH (ref 0.57–1.00)
GFR, EST AFRICAN AMERICAN: 52 mL/min/{1.73_m2} — AB (ref >59)
GFR, EST NON AFRICAN AMERICAN: 45 mL/min/{1.73_m2} — AB (ref >59)
Glucose: 135 mg/dL — ABNORMAL HIGH (ref 65–99)
Potassium: 3.8 mmol/L (ref 3.5–5.2)
Sodium: 134 mmol/L (ref 134–144)

## 2018-05-24 LAB — LYME AB/WESTERN BLOT REFLEX
LYME DISEASE AB, QUANT, IGM: 1.03 index — ABNORMAL HIGH (ref 0.00–0.79)
Lyme IgG/IgM Ab: <0.91 {ISR} (ref 0.00–0.90)

## 2018-05-24 LAB — ROCKY MTN SPOTTED FVR ABS PNL(IGG+IGM)
RMSF IgG: NEGATIVE
RMSF IgM: 3.2 index — ABNORMAL HIGH (ref 0.00–0.89)

## 2018-05-29 ENCOUNTER — Telehealth: Payer: Self-pay | Admitting: Family Medicine

## 2018-05-29 NOTE — Telephone Encounter (Signed)
Patient taking Cipro for bladder infection and doxycycline for rocky [place] spotted fever.  Pharmacy says she should not take together .  Please advise .

## 2018-05-29 NOTE — Telephone Encounter (Signed)
Leave off Cipro while on doxycycline.  Patient should not be taking both of these medicines at the same time.

## 2018-05-29 NOTE — Telephone Encounter (Signed)
Pt aware - per DWM verbal order to take cipro bid x 3 days (already has at home) Hold doxy while doing this and then resume doxy when finished.

## 2018-06-27 ENCOUNTER — Telehealth: Payer: Self-pay | Admitting: Family Medicine

## 2018-06-28 NOTE — Telephone Encounter (Signed)
Discussed labs that she will have done on 8/13 OV

## 2018-07-01 ENCOUNTER — Telehealth: Payer: Self-pay | Admitting: Family Medicine

## 2018-07-01 ENCOUNTER — Other Ambulatory Visit: Payer: Self-pay | Admitting: *Deleted

## 2018-07-01 DIAGNOSIS — R5383 Other fatigue: Secondary | ICD-10-CM

## 2018-07-01 DIAGNOSIS — W57XXXA Bitten or stung by nonvenomous insect and other nonvenomous arthropods, initial encounter: Secondary | ICD-10-CM

## 2018-07-01 NOTE — Telephone Encounter (Signed)
Noted  

## 2018-07-03 ENCOUNTER — Other Ambulatory Visit: Payer: Medicare Other

## 2018-07-03 DIAGNOSIS — E7800 Pure hypercholesterolemia, unspecified: Secondary | ICD-10-CM

## 2018-07-03 DIAGNOSIS — I10 Essential (primary) hypertension: Secondary | ICD-10-CM | POA: Diagnosis not present

## 2018-07-03 DIAGNOSIS — R5383 Other fatigue: Secondary | ICD-10-CM

## 2018-07-03 DIAGNOSIS — I2510 Atherosclerotic heart disease of native coronary artery without angina pectoris: Secondary | ICD-10-CM

## 2018-07-03 DIAGNOSIS — E559 Vitamin D deficiency, unspecified: Secondary | ICD-10-CM | POA: Diagnosis not present

## 2018-07-03 DIAGNOSIS — K219 Gastro-esophageal reflux disease without esophagitis: Secondary | ICD-10-CM

## 2018-07-03 DIAGNOSIS — W57XXXA Bitten or stung by nonvenomous insect and other nonvenomous arthropods, initial encounter: Secondary | ICD-10-CM | POA: Diagnosis not present

## 2018-07-05 ENCOUNTER — Telehealth: Payer: Self-pay | Admitting: Family Medicine

## 2018-07-05 LAB — CBC WITH DIFFERENTIAL/PLATELET
BASOS ABS: 0 10*3/uL (ref 0.0–0.2)
Basos: 1 %
EOS (ABSOLUTE): 0.2 10*3/uL (ref 0.0–0.4)
Eos: 3 %
HEMOGLOBIN: 14.3 g/dL (ref 11.1–15.9)
Hematocrit: 42.4 % (ref 34.0–46.6)
IMMATURE GRANS (ABS): 0 10*3/uL (ref 0.0–0.1)
Immature Granulocytes: 0 %
LYMPHS ABS: 1.2 10*3/uL (ref 0.7–3.1)
Lymphs: 19 %
MCH: 32.4 pg (ref 26.6–33.0)
MCHC: 33.7 g/dL (ref 31.5–35.7)
MCV: 96 fL (ref 79–97)
MONOCYTES: 10 %
Monocytes Absolute: 0.6 10*3/uL (ref 0.1–0.9)
Neutrophils Absolute: 4.4 10*3/uL (ref 1.4–7.0)
Neutrophils: 67 %
Platelets: 259 10*3/uL (ref 150–450)
RBC: 4.41 x10E6/uL (ref 3.77–5.28)
RDW: 13.7 % (ref 12.3–15.4)
WBC: 6.4 10*3/uL (ref 3.4–10.8)

## 2018-07-05 LAB — BMP8+EGFR
BUN/Creatinine Ratio: 18 (ref 12–28)
BUN: 19 mg/dL (ref 8–27)
CHLORIDE: 101 mmol/L (ref 96–106)
CO2: 24 mmol/L (ref 20–29)
Calcium: 9 mg/dL (ref 8.7–10.3)
Creatinine, Ser: 1.06 mg/dL — ABNORMAL HIGH (ref 0.57–1.00)
GFR calc Af Amer: 56 mL/min/{1.73_m2} — ABNORMAL LOW (ref >59)
GFR calc non Af Amer: 49 mL/min/{1.73_m2} — ABNORMAL LOW (ref >59)
Glucose: 82 mg/dL (ref 65–99)
Potassium: 3.8 mmol/L (ref 3.5–5.2)
Sodium: 143 mmol/L (ref 134–144)

## 2018-07-05 LAB — LIPID PANEL
CHOLESTEROL TOTAL: 221 mg/dL — AB (ref 100–199)
Chol/HDL Ratio: 4.7 ratio — ABNORMAL HIGH (ref 0.0–4.4)
HDL: 47 mg/dL (ref >39)
LDL CALC: 142 mg/dL — AB (ref 0–99)
TRIGLYCERIDES: 161 mg/dL — AB (ref 0–149)
VLDL Cholesterol Cal: 32 mg/dL (ref 5–40)

## 2018-07-05 LAB — LYME AB/WESTERN BLOT REFLEX: LYME DISEASE AB, QUANT, IGM: 0.94 index — ABNORMAL HIGH (ref 0.00–0.79)

## 2018-07-05 LAB — LYME, WESTERN BLOT, SERUM (REFLEXED)
IGG P23 AB.: ABSENT
IGG P66 AB.: ABSENT
IGM P23 AB.: ABSENT
IGM P41 AB.: ABSENT
IgG P28 Ab.: ABSENT
IgG P30 Ab.: ABSENT
IgG P39 Ab.: ABSENT
IgG P41 Ab.: ABSENT
IgG P45 Ab.: ABSENT
IgG P58 Ab.: ABSENT
IgG P93 Ab.: ABSENT
IgM P39 Ab.: ABSENT
LYME IGG WB: NEGATIVE
LYME IGM WB: NEGATIVE

## 2018-07-05 LAB — HEPATIC FUNCTION PANEL
ALBUMIN: 4.1 g/dL (ref 3.5–4.7)
ALK PHOS: 80 IU/L (ref 39–117)
ALT: 13 IU/L (ref 0–32)
AST: 19 IU/L (ref 0–40)
BILIRUBIN, DIRECT: 0.14 mg/dL (ref 0.00–0.40)
Bilirubin Total: 0.5 mg/dL (ref 0.0–1.2)
Total Protein: 6.4 g/dL (ref 6.0–8.5)

## 2018-07-05 LAB — THYROID PANEL WITH TSH
Free Thyroxine Index: 2.4 (ref 1.2–4.9)
T3 Uptake Ratio: 32 % (ref 24–39)
T4 TOTAL: 7.4 ug/dL (ref 4.5–12.0)
TSH: 1.83 u[IU]/mL (ref 0.450–4.500)

## 2018-07-05 LAB — ROCKY MTN SPOTTED FVR ABS PNL(IGG+IGM)
RMSF IGM: 2.49 {index} — AB (ref 0.00–0.89)
RMSF IgG: NEGATIVE

## 2018-07-05 LAB — VITAMIN D 25 HYDROXY (VIT D DEFICIENCY, FRACTURES): Vit D, 25-Hydroxy: 45 ng/mL (ref 30.0–100.0)

## 2018-07-05 NOTE — Telephone Encounter (Signed)
Pt aware of [facility] lab results.

## 2018-07-09 ENCOUNTER — Ambulatory Visit (INDEPENDENT_AMBULATORY_CARE_PROVIDER_SITE_OTHER): Payer: Medicare Other | Admitting: Family Medicine

## 2018-07-09 ENCOUNTER — Encounter: Payer: Self-pay | Admitting: Family Medicine

## 2018-07-09 VITALS — BP 182/92 | HR 105 | Temp 97.3°F | Ht 64.0 in | Wt 176.0 lb

## 2018-07-09 DIAGNOSIS — E7800 Pure hypercholesterolemia, unspecified: Secondary | ICD-10-CM | POA: Diagnosis not present

## 2018-07-09 DIAGNOSIS — E559 Vitamin D deficiency, unspecified: Secondary | ICD-10-CM | POA: Diagnosis not present

## 2018-07-09 DIAGNOSIS — I10 Essential (primary) hypertension: Secondary | ICD-10-CM

## 2018-07-09 DIAGNOSIS — K219 Gastro-esophageal reflux disease without esophagitis: Secondary | ICD-10-CM

## 2018-07-09 DIAGNOSIS — I480 Paroxysmal atrial fibrillation: Secondary | ICD-10-CM

## 2018-07-09 DIAGNOSIS — R5383 Other fatigue: Secondary | ICD-10-CM

## 2018-07-09 DIAGNOSIS — I2510 Atherosclerotic heart disease of native coronary artery without angina pectoris: Secondary | ICD-10-CM | POA: Diagnosis not present

## 2018-07-09 MED ORDER — DOXYCYCLINE HYCLATE 100 MG PO TABS: 100.0000 mg | ORAL_TABLET | Freq: Two times a day (BID) | ORAL | 0 refills | Status: DC

## 2018-07-09 MED ORDER — VENLAFAXINE HCL ER 37.5 MG PO CP24: 37.5000 mg | ORAL_CAPSULE | Freq: Every day | ORAL | 1 refills | Status: DC

## 2018-07-09 NOTE — Progress Notes (Signed)
Subjective:    Patient ID: Emily Wagner, female    DOB: Jan 01, 1933, 84 y.o.   MRN: [id number]  HPI Pt here for follow up and management of chronic medical problems which includes hypertension and a fib. She is taking medication regularly.  The patient today complains of fatigue weakness and head congestion.  She has had lab work done and this will be reviewed with her during the visit today.  The vitamin D level was good at 45.0.  The Lyme titers and [facility] spotted fever titers are both diminished from when they were checked previously 1 month ago.  The blood sugar is good at 82.  The creatinine remains slightly elevated at 1.06.  All of the electrolytes including potassium are good.  The CBC was within normal limits.  Cholesterol numbers remain elevated with a total LDL C being 142 good cholesterol being good at 47 triglycerides elevated at 161.  All liver function tests were normal.  All thyroid tests were normal.  The patient brings in blood pressures also from the outside.  The most recent ones look good ranging from 131-147/76 to mid 80s.  The patient is very frustrated and angry and distressed today.  She is been feeling bad for 3 months she says and nobody can find out what is causing her to be feeling bad and having no energy.  She is been frustrated with her knee pain but her knee is better following shot by the orthopedist.  She has seen the cardiologist.  She was diagnosed with Lyme disease.  She did take 3 weeks worth of doxycycline.  She denies any chest pain pressure tightness or shortness of breath other than just being fatigued.  She denies any trouble with her stomach or change in bowel habits including nausea vomiting diarrhea blood in the stool or black tarry bowel movements.  She is passing her water without problems.   Patient Active Problem List   Diagnosis Date Noted  . Aortic atherosclerosis ([place]) 12/12/2017  . SOB (shortness of breath) 05/17/2017  . Long term  (current) use of anticoagulants 11/11/2015  . Atrial fibrillation ([place]) 11/04/2015  . 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 Infection of urinary tract 10/31/2015  . Unstable angina ([place]) 10/29/2015  . CAD (coronary artery disease) 10/29/2015  . Benign hypertensive heart disease without heart failure   . Interstitial cystitis   . Osteopenia   . Pelvic fracture ([place]) 05/30/2015  . Allergy to multiple medications 07/15/2013  . Renal insufficiency   . Adjustment disorder with mixed anxiety and depressed mood   . Osteoporosis   . ASCVD (arteriosclerotic cardiovascular disease)   . 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  . HYPERCHOLESTEROLEMIA 05/02/2008  . Essential hypertension 05/02/2008  . INTERSTITIAL CYSTITIS 05/02/2008  . DIVERTICULOSIS OF COLON 08/27/2003   Outpatient Encounter Medications as of 07/09/2018  Medication Sig  . amLODipine (NORVASC) 5 MG tablet Take 1 tablet (5 mg total) daily by mouth. (Patient taking differently: Take 5 mg by mouth daily as needed. )  . aspirin EC 81 MG tablet Take 81 mg by mouth daily.  . Cholecalciferol (VITAMIN D) 2000 UNITS CAPS Take 2,000 Units by mouth daily.   . Cyanocobalamin (VITAMIN B-12 PO) Take 5,000 mcg by mouth every morning.  . fexofenadine (ALLEGRA) 180 MG tablet Take 180 mg by mouth daily.  . hydrochlorothiazide (HYDRODIURIL) 25 MG tablet Take 1 tablet (25 mg total) by mouth daily.  Marland Kitchen levothyroxine (SYNTHROID, LEVOTHROID) 50 MCG tablet Take 1  tablet (50 mcg total) by mouth daily before breakfast.  . LORazepam (ATIVAN) 0.5 MG tablet TAKE 1 TABLET BY MOUTH TWICE A DAY AS NEEDED  . Multiple Vitamins-Minerals (PRESERVISION AREDS PO) Take 1 tablet by mouth every other day.   . ranitidine (ZANTAC) 150 MG tablet Take 150 mg by mouth 3 (three) times daily.   No facility-administered encounter medications on file as of 07/09/2018.      Review of Systems  Constitutional: Positive for fatigue.  HENT: Positive for congestion (head).   Eyes: Negative.    Respiratory: Negative.   Cardiovascular: Negative.   Gastrointestinal: Negative.   Endocrine: Negative.   Genitourinary: Negative.   Musculoskeletal: Negative.  Negative for arthralgias.  Skin: Negative.   Allergic/Immunologic: Negative.   Neurological: Positive for weakness.  Hematological: Negative.   Psychiatric/Behavioral: Negative.        Objective:   Physical Exam  Constitutional: She is oriented to person, place, and time. She appears well-developed and well-nourished. She appears distressed.  The patient is crying and upset and not very complementary of any of the people she is seen recently.  She says she just feels bad and she wants to feel better.  HENT:  Head: Normocephalic and atraumatic.  Right Ear: External ear normal.  Left Ear: External ear normal.  Nose: Nose normal.  Mouth/Throat: Oropharynx is clear and moist. No oropharyngeal exudate.  Eyes: Pupils are equal, round, and reactive to light. Conjunctivae and EOM are normal. Right eye exhibits no discharge. Left eye exhibits no discharge. No scleral icterus.  Neck: Normal range of motion. Neck supple. No thyromegaly present.  No bruits thyromegaly or anterior cervical adenopathy  Cardiovascular: Normal rate, regular rhythm, normal heart sounds and intact distal pulses.  No murmur heard. Heart is regular today at 72/min with good pedal pulses and no edema  Pulmonary/Chest: Effort normal and breath sounds normal. She has no wheezes. She has no rales.  Clear anteriorly and posteriorly  Abdominal: Soft. Bowel sounds are normal. She exhibits no mass. There is no tenderness. There is no guarding.  No abdominal tenderness liver or spleen enlargement organ enlargement bruits or inguinal adenopathy.  Musculoskeletal: Normal range of motion. She exhibits no edema.  Lymphadenopathy:    She has no cervical adenopathy.  Neurological: She is alert and oriented to person, place, and time. She has normal reflexes. No cranial  nerve deficit.  Skin: Skin is warm and dry. No rash noted. No erythema.  Psychiatric: She has a normal mood and affect. Judgment and thought content normal.  Patient is extremely nervous anxious and upset today for many reasons.  Nursing note and vitals reviewed.  BP (!) 177/102 (BP Location: Left Arm)   Pulse (!) 105   Temp (!) 97.3 F (36.3 C) (Oral)   Ht 5\' 4"  (1.626 m)   Wt 176 lb (79.8 kg)   LMP 03/21/1971   BMI 30.21 kg/m         Assessment & Plan:  1. HYPERCHOLESTEROLEMIA -Patient is statin intolerant we will have to continue with as aggressive therapeutic lifestyle changes as possible including diet and exercise  2. Vitamin D deficiency -Continue with vitamin D replacement  3. ASCVD (arteriosclerotic cardiovascular disease) -Follow-up with cardiology as planned  4. Gastroesophageal reflux disease, esophagitis presence not specified -No complaints today with reflux she will continue with her ranitidine.  5. Paroxysmal atrial fibrillation (HCC) -Follow-up with cardiology as planned and heart had regular rate and rhythm today at about 72/min  6. Essential hypertension -  Blood pressure was elevated but patient was also upset.  She brings readings in for review and these will be scanned into the record.  7. Other fatigue -Another round of antibiotics because of diagnosis of Lyme disease and we will also start her on some Effexor after taking about another week's worth of doxycycline.  Meds ordered this encounter  Medications  . venlafaxine XR (EFFEXOR XR) 37.5 MG 24 hr capsule    Sig: Take 1 capsule (37.5 mg total) by mouth daily with breakfast.    Dispense:  30 capsule    Refill:  1  . doxycycline (VIBRA-TABS) 100 MG tablet    Sig: Take 1 tablet (100 mg total) by mouth 2 (two) times daily. 1 po bid    Dispense:  42 tablet    Refill:  0   Patient Instructions                       Medicare Annual Wellness Visit  [facility] and the medical providers at  [facility] strive to bring you the best medical care.  In doing so we not only want to address your current medical conditions and concerns but also to detect new conditions early and prevent illness, disease and health-related problems.    Medicare offers a yearly Wellness Visit which allows our clinical staff to assess your need for preventative services including immunizations, lifestyle education, counseling to decrease risk of preventable diseases and screening for fall risk and other medical concerns.    This visit is provided free of charge (no copay) for all Medicare recipients. The clinical pharmacists at [facility] have begun to conduct these Wellness Visits which will also include a thorough review of all your medications.    As you primary medical provider recommend that you make an appointment for your Annual Wellness Visit if you have not done so already this year.  You may set up this appointment before you leave today or you may call back ([phone]) and schedule an appointment.  Please make sure when you call that you mention that you are scheduling your Annual Wellness Visit with the clinical pharmacist so that the appointment may be made for the proper length of time.     Continue current medications. Continue good therapeutic lifestyle changes which include good diet and exercise. Fall precautions discussed with patient. If an FOBT was given today- please return it to our front desk. If you are over 19 years old - you may need Prevnar 81 or the adult Pneumonia vaccine.  **Flu shots are available--- please call and schedule a FLU-CLINIC appointment**  After your visit with [name] today you will receive a survey in the mail or online from [employer] regarding your care with [name]. Please take a moment to fill this out. Your feedback is very important to [name] as you can help [name] better understand your patient needs as well as improve your  experience and satisfaction. WE CARE ABOUT YOU!!!   Continue to drink plenty of fluids and drink more lemon juice. We will give you another round of doxycycline to take 100 mg twice daily with food for 3 weeks We will try you on a low-dose of Effexor long-acting to see if this can make and help your energy level.  We will start with Effexor 37.5 and have you take 1 daily at bedtime. The patient should watch her sodium intake and continue to monitor blood pressures closely at  home and come by the office in a couple weeks for recheck of the blood pressure.  Arrie Senate MD

## 2018-07-09 NOTE — Patient Instructions (Addendum)
Medicare Annual Wellness Visit  [facility] and the medical providers at [facility] strive to bring you the best medical care.  In doing so we not only want to address your current medical conditions and concerns but also to detect new conditions early and prevent illness, disease and health-related problems.    Medicare offers a yearly Wellness Visit which allows our clinical staff to assess your need for preventative services including immunizations, lifestyle education, counseling to decrease risk of preventable diseases and screening for fall risk and other medical concerns.    This visit is provided free of charge (no copay) for all Medicare recipients. The clinical pharmacists at [facility] have begun to conduct these Wellness Visits which will also include a thorough review of all your medications.    As you primary medical provider recommend that you make an appointment for your Annual Wellness Visit if you have not done so already this year.  You may set up this appointment before you leave today or you may call back ([phone]) and schedule an appointment.  Please make sure when you call that you mention that you are scheduling your Annual Wellness Visit with the clinical pharmacist so that the appointment may be made for the proper length of time.     Continue current medications. Continue good therapeutic lifestyle changes which include good diet and exercise. Fall precautions discussed with patient. If an FOBT was given today- please return it to our front desk. If you are over 78 years old - you may need Prevnar 54 or the adult Pneumonia vaccine.  **Flu shots are available--- please call and schedule a FLU-CLINIC appointment**  After your visit with [name] today you will receive a survey in the mail or online from [employer] regarding your care with [name]. Please take a moment to fill this out. Your feedback is very  important to [name] as you can help [name] better understand your patient needs as well as improve your experience and satisfaction. WE CARE ABOUT YOU!!!   Continue to drink plenty of fluids and drink more lemon juice. We will give you another round of doxycycline to take 100 mg twice daily with food for 3 weeks We will try you on a low-dose of Effexor long-acting to see if this can make and help your energy level.  We will start with Effexor 37.5 and have you take 1 daily at bedtime. The patient should watch her sodium intake and continue to monitor blood pressures closely at home and come by the office in a couple weeks for recheck of the blood pressure.

## 2018-07-10 ENCOUNTER — Telehealth: Payer: Self-pay | Admitting: Family Medicine

## 2018-07-10 NOTE — Telephone Encounter (Signed)
Pt aware - AM

## 2018-07-26 ENCOUNTER — Other Ambulatory Visit: Payer: Self-pay | Admitting: Family Medicine

## 2018-07-26 MED ORDER — VENLAFAXINE HCL ER 37.5 MG PO CP24: 37.5000 mg | ORAL_CAPSULE | Freq: Every day | ORAL | 0 refills | Status: DC

## 2018-07-26 NOTE — Telephone Encounter (Signed)
Pt aware refill sent to pharmacy 

## 2018-08-01 ENCOUNTER — Other Ambulatory Visit: Payer: Self-pay | Admitting: *Deleted

## 2018-08-01 ENCOUNTER — Telehealth: Payer: Self-pay | Admitting: Family Medicine

## 2018-08-01 ENCOUNTER — Other Ambulatory Visit: Payer: Medicare Other

## 2018-08-01 DIAGNOSIS — R3915 Urgency of urination: Secondary | ICD-10-CM

## 2018-08-01 LAB — URINALYSIS, COMPLETE
Bilirubin, UA: NEGATIVE
Glucose, UA: NEGATIVE
Ketones, UA: NEGATIVE
Nitrite, UA: NEGATIVE
PH UA: 6 (ref 5.0–7.5)
Protein, UA: NEGATIVE
Specific Gravity, UA: 1.02 (ref 1.005–1.030)
Urobilinogen, Ur: 0.2 mg/dL (ref 0.2–1.0)

## 2018-08-01 LAB — MICROSCOPIC EXAMINATION

## 2018-08-01 NOTE — Telephone Encounter (Signed)
Hard to urinate Urge to go, only a little comes out. No burning Wants antibiotic for UTI?   Can come by and leave a urine - ordered.

## 2018-08-02 ENCOUNTER — Other Ambulatory Visit: Payer: Self-pay | Admitting: *Deleted

## 2018-08-02 ENCOUNTER — Telehealth: Payer: Self-pay | Admitting: Family Medicine

## 2018-08-02 ENCOUNTER — Other Ambulatory Visit: Payer: Self-pay | Admitting: Family Medicine

## 2018-08-02 MED ORDER — CEFDINIR 300 MG PO CAPS: 300.0000 mg | ORAL_CAPSULE | Freq: Two times a day (BID) | ORAL | 0 refills | Status: DC

## 2018-08-02 MED ORDER — LORAZEPAM 0.5 MG PO TABS: 0.5000 mg | ORAL_TABLET | Freq: Two times a day (BID) | ORAL | 2 refills | Status: DC | PRN

## 2018-08-02 NOTE — Telephone Encounter (Signed)
Patient is allergic to Cipro sulfa and Macrodantin along with many other allergies.  If she is not allergic to penicillin you can try Omnicef 300 twice daily with food and give her enough for 10 days until culture and sensitivity is returned

## 2018-08-02 NOTE — Telephone Encounter (Signed)
Last seen 07/09/18  DWM

## 2018-08-02 NOTE — Telephone Encounter (Signed)
Aware med sent

## 2018-08-02 NOTE — Telephone Encounter (Signed)
Patient was informed of her urinalysis results.  She is very uncomfortable and does not feel that she can wait for the urine culture results to come back.  Do you think Dr. Laurance Flatten would [facility]?

## 2018-08-05 ENCOUNTER — Other Ambulatory Visit: Payer: Self-pay

## 2018-08-05 DIAGNOSIS — N390 Urinary tract infection, site not specified: Secondary | ICD-10-CM

## 2018-08-05 LAB — URINE CULTURE

## 2018-08-07 DIAGNOSIS — N952 Postmenopausal atrophic vaginitis: Secondary | ICD-10-CM | POA: Diagnosis not present

## 2018-08-07 DIAGNOSIS — N3020 Other chronic cystitis without hematuria: Secondary | ICD-10-CM | POA: Diagnosis not present

## 2018-08-07 DIAGNOSIS — R3911 Hesitancy of micturition: Secondary | ICD-10-CM | POA: Diagnosis not present

## 2018-08-07 DIAGNOSIS — M545 Low back pain: Secondary | ICD-10-CM | POA: Diagnosis not present

## 2018-08-08 DIAGNOSIS — M9904 Segmental and somatic dysfunction of sacral region: Secondary | ICD-10-CM | POA: Diagnosis not present

## 2018-08-08 DIAGNOSIS — M9903 Segmental and somatic dysfunction of lumbar region: Secondary | ICD-10-CM | POA: Diagnosis not present

## 2018-08-08 DIAGNOSIS — M9905 Segmental and somatic dysfunction of pelvic region: Secondary | ICD-10-CM | POA: Diagnosis not present

## 2018-08-08 DIAGNOSIS — M5430 Sciatica, unspecified side: Secondary | ICD-10-CM | POA: Diagnosis not present

## 2018-08-12 ENCOUNTER — Telehealth: Payer: Self-pay | Admitting: Family Medicine

## 2018-08-12 NOTE — Telephone Encounter (Signed)
Pt decided to keep 9/27 appt     DR Laurance Flatten,  Pt would like to if her antidepressant can be increased? She feels like it need to be stronger?  She also wants a order for rck on RMSF.

## 2018-08-13 NOTE — Telephone Encounter (Signed)
May increase Effexor to 75 mg extended release daily. The last IgM levels were already lower and we can repeat the test for [facility] spotted fever and Lyme, if the patient so desires, and if they continue to go lower that means that the infection is being and has been treated adequately.

## 2018-08-13 NOTE — Progress Notes (Signed)
Cardiology Office Note   Date:  08/14/2018   ID:  Emily Wagner, DOB 1933/02/13, MRN [id number]  PCP:  Chipper Herb, MD  Cardiologist:   No primary care provider on file.   Chief Complaint  Patient presents with  . Fatigue      History of Present Illness: Emily Wagner is a 84 y.o. female who presents for evaluation of atrial fib.     She has a history of  bypass surgery.  She has had fluctuating BPs.  Unfortunately since I last saw her she has had [facility] spotted fever and Lyme's disease.  She says she is been very sick and weak because of this.  She has not had any overt cardiovascular symptoms.  She is not doing much around the house but with her levels of activity she is not having any chest pressure, neck or arm discomfort.  She has none of the symptoms that she had prior to her bypass.  She is not having any PND or orthopnea.  She has not noticed any palpitations and has had no presyncope or syncope.  Her blood pressures have been fluctuating with systolics in the 833A to 250N.  If her blood pressure goes up significantly she will take a beta-blocker but she has not had to do this in quite a while.   Past Medical History:  Diagnosis Date  . Adjustment disorder with mixed anxiety and depressed mood   . Atrial fibrillation ([place]) 11/04/2015   Post-operative  . Benign hypertensive heart disease without heart failure   . CAD (coronary artery disease)   . Infection of urinary tract 10/31/2015  . Interstitial cystitis   . Osteopenia   . Other and unspecified hyperlipidemia   . Other dyspnea and respiratory abnormality   . Pelvic fracture ([place]) 05/30/2015  . 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 ABDOMINAL HYSTERECTOMY     Cancer and endometriosis  . CARDIAC CATHETERIZATION  2008  . CARDIAC CATHETERIZATION N/A 10/29/2015   Procedure: Left Heart Cath and Coronary Angiography;  Surgeon: Belva Crome,  MD;  Location: [facility];  Service: Cardiovascular;  Laterality: N/A;  . CATARACT EXTRACTION    . CHOLECYSTECTOMY    . CORONARY ARTERY BYPASS GRAFT N/A 11/02/2015   Procedure: OFF PUMP CORONARY ARTERY BYPASS GRAFTING (CABG) TIMES ONE;  Surgeon: Rexene Alberts, MD;  Location: [facility];  Service: Open Heart Surgery;  Laterality: N/A;  LIMA to LAD  . FRACTURE SURGERY     Right hip  . HERNIA REPAIR     Right inguinal  . TEE WITHOUT CARDIOVERSION N/A 11/02/2015   Procedure: TRANSESOPHAGEAL ECHOCARDIOGRAM (TEE);  Surgeon: Rexene Alberts, MD;  Location: [facility];  Service: Open Heart Surgery;  Laterality: N/A;     Current Outpatient Medications  Medication Sig Dispense Refill  . aspirin EC 81 MG tablet Take 81 mg by mouth daily.    . Cholecalciferol (VITAMIN D) 2000 UNITS CAPS Take 2,000 Units by mouth daily.     . Cyanocobalamin (VITAMIN B-12 PO) Take 600 mcg by mouth every morning.     . hydrochlorothiazide (HYDRODIURIL) 25 MG tablet Take 1 tablet (25 mg total) by mouth daily. 90 tablet 3  . levothyroxine (SYNTHROID, LEVOTHROID) 50 MCG tablet Take 1 tablet (50 mcg total) by mouth daily before breakfast. 90 tablet 3  . LORazepam (ATIVAN) 0.5  MG tablet Take 1 tablet (0.5 mg total) by mouth 2 (two) times daily as needed. 60 tablet 2  . Multiple Vitamins-Minerals (PRESERVISION AREDS PO) Take 1 tablet by mouth every other day.     . ranitidine (ZANTAC) 150 MG tablet Take 150 mg by mouth 3 (three) times daily.    Marland Kitchen venlafaxine XR (EFFEXOR XR) 37.5 MG 24 hr capsule Take 1 capsule (37.5 mg total) by mouth daily with breakfast. 90 capsule 0  . amLODipine (NORVASC) 5 MG tablet Take 1 tablet (5 mg total) daily by mouth. (Patient not taking: Reported on 08/14/2018) 90 tablet 2  . fexofenadine (ALLEGRA) 180 MG tablet Take 180 mg by mouth daily.     No current facility-administered medications for this visit.     Allergies:   Nitrofurantoin; Nsaids; Other; Flonase [fluticasone propionate]; Prevnar 13  [pneumococcal 13-val conj vacc]; Beta adrenergic blockers; Potassium-containing compounds; Alendronate sodium; Ciprofloxacin; Codeine; Crestor [rosuvastatin calcium]; Darvon; Lipitor [atorvastatin calcium]; Pravachol; Propranolol hcl; Sulfa antibiotics; Sulfamethoxazole; Ultram [tramadol hcl]; Welchol [colesevelam hcl]; Zetia [ezetimibe]; and Zocor [simvastatin]    ROS:  Please see the history of present illness.   Otherwise, review of systems are positive for fatigue, headaches, tremors, weakness.   All other systems are reviewed and negative.    PHYSICAL EXAM: VS:  BP (!) 132/102   Pulse 98   Ht 5\' 4"  (1.626 m)   Wt 172 lb (78 kg)   LMP 03/21/1971   BMI 29.52 kg/m  , BMI Body mass index is 29.52 kg/m.  GENERAL: She looks fatigued and slightly appearing NECK:  No jugular venous distention, waveform within normal limits, carotid upstroke brisk and symmetric, no bruits, no thyromegaly LUNGS:  Clear to auscultation bilaterally CHEST:  Unremarkable HEART:  PMI not displaced or sustained,S1 and S2 within normal limits, no S3, no S4, no clicks, no rubs, no murmurs ABD:  Flat, positive bowel sounds normal in frequency in pitch, no bruits, no rebound, no guarding, no midline pulsatile mass, no hepatomegaly, no splenomegaly EXT:  2 plus pulses throughout, no edema, no cyanosis no clubbing NEURO:  Resting tremor    EKG:  EKG is  ordered today. The ekg ordered 02/02/17 demonstrates sinus rhythm, rate 98, premature atrial contractions, axis within normal limits, possible old inferior infarct.   Recent Labs: 07/03/2018: ALT 13; BUN 19; Creatinine, Ser 1.06; Hemoglobin 14.3; Platelets 259; Potassium 3.8; Sodium 143; TSH 1.830    Lipid Panel    Component Value Date/Time   CHOL 221 (H) 07/03/2018 1303   TRIG 161 (H) 07/03/2018 1303   TRIG 162 (H) 06/24/2015 0918   HDL 47 07/03/2018 1303   HDL 44 06/24/2015 0918   CHOLHDL 4.7 (H) 07/03/2018 1303   CHOLHDL 5.4 10/31/2015 0215   VLDL 20  10/31/2015 0215   LDLCALC 142 (H) 07/03/2018 1303   LDLCALC 163 (H) 05/14/2014 1038      Wt Readings from Last 3 Encounters:  08/14/18 172 lb (78 kg)  07/09/18 176 lb (79.8 kg)  03/25/18 171 lb (77.6 kg)      Other studies Reviewed: Additional studies/ records that were reviewed today include: Labs Review of the above records demonstrates:      ASSESSMENT AND PLAN:   CAD:   The patient has no new sypmtoms.  No further cardiovascular testing is indicated.  We will continue with aggressive risk reduction and meds as listed.  DYSLIPIDEMIA:     She is been intolerant of statins.  No change in therapy is indicated.  Given her chronic health problems I would not push for PCSK9 at this time but I will discuss this further in the future.   HTN:  Her BP is fluctuating.  However, she is very fatigued and feels poorly with her chronic illnesses and so we agree not to titrate meds at this point.  She can continue with the as needed beta-blocker and I would probably add a low-dose of this if she is having to use it frequently but that is currently not the case.   Current medicines are reviewed at length with the patient today.  The patient does not have concerns regarding medicines.  The following changes have been made:  None  Labs/ tests ordered today include: None  Orders Placed This Encounter  Procedures  . EKG 12-Lead     Disposition:   FU with me one year.    Signed, Minus Breeding, MD  08/14/2018 11:12 AM    [place]

## 2018-08-13 NOTE — Telephone Encounter (Signed)
Pt notified of recommendation Verbalizes understanding 

## 2018-08-14 ENCOUNTER — Ambulatory Visit: Payer: Medicare Other | Admitting: Cardiology

## 2018-08-14 ENCOUNTER — Encounter: Payer: Self-pay | Admitting: Cardiology

## 2018-08-14 VITALS — BP 132/102 | HR 98 | Ht 64.0 in | Wt 172.0 lb

## 2018-08-14 DIAGNOSIS — I10 Essential (primary) hypertension: Secondary | ICD-10-CM | POA: Diagnosis not present

## 2018-08-14 DIAGNOSIS — E785 Hyperlipidemia, unspecified: Secondary | ICD-10-CM | POA: Diagnosis not present

## 2018-08-14 DIAGNOSIS — I2510 Atherosclerotic heart disease of native coronary artery without angina pectoris: Secondary | ICD-10-CM | POA: Diagnosis not present

## 2018-08-14 NOTE — Patient Instructions (Signed)

## 2018-08-16 ENCOUNTER — Telehealth: Payer: Self-pay | Admitting: Family Medicine

## 2018-08-16 MED ORDER — FIRST-DUKES MOUTHWASH MT SUSP: OROMUCOSAL | 0 refills | Status: DC

## 2018-08-16 NOTE — Telephone Encounter (Signed)
Pt aware and med sent in  

## 2018-08-16 NOTE — Telephone Encounter (Signed)
Magic mouthwash.  Swish 1 teaspoon and mouth after meals and at bedtime for 2 to 3 minutes and expectorate.  200 cc.  Make sure she is not allergic to any of the ingredients in the mouthwash and confirm this with the druggist

## 2018-08-16 NOTE — Telephone Encounter (Signed)
[facility]  PT wants to know if we can send in an medication to pharmacy due to sores in her mouth

## 2018-08-22 DIAGNOSIS — B351 Tinea unguium: Secondary | ICD-10-CM | POA: Diagnosis not present

## 2018-08-22 DIAGNOSIS — M79674 Pain in right toe(s): Secondary | ICD-10-CM | POA: Diagnosis not present

## 2018-08-23 ENCOUNTER — Telehealth: Payer: Self-pay | Admitting: *Deleted

## 2018-08-23 ENCOUNTER — Ambulatory Visit (INDEPENDENT_AMBULATORY_CARE_PROVIDER_SITE_OTHER): Payer: Medicare Other | Admitting: *Deleted

## 2018-08-23 ENCOUNTER — Ambulatory Visit: Payer: Medicare Other

## 2018-08-23 ENCOUNTER — Ambulatory Visit (INDEPENDENT_AMBULATORY_CARE_PROVIDER_SITE_OTHER): Payer: Medicare Other | Admitting: Family Medicine

## 2018-08-23 ENCOUNTER — Encounter: Payer: Self-pay | Admitting: Family Medicine

## 2018-08-23 VITALS — BP 138/89 | HR 113 | Temp 97.5°F | Ht 64.0 in | Wt 172.0 lb

## 2018-08-23 DIAGNOSIS — Z0000 Encounter for general adult medical examination without abnormal findings: Secondary | ICD-10-CM | POA: Diagnosis not present

## 2018-08-23 DIAGNOSIS — M25562 Pain in left knee: Secondary | ICD-10-CM

## 2018-08-23 DIAGNOSIS — Z23 Encounter for immunization: Secondary | ICD-10-CM | POA: Diagnosis not present

## 2018-08-23 DIAGNOSIS — W57XXXD Bitten or stung by nonvenomous insect and other nonvenomous arthropods, subsequent encounter: Secondary | ICD-10-CM

## 2018-08-23 DIAGNOSIS — G8929 Other chronic pain: Secondary | ICD-10-CM

## 2018-08-23 DIAGNOSIS — Z7689 Persons encountering health services in other specified circumstances: Secondary | ICD-10-CM | POA: Diagnosis not present

## 2018-08-23 MED ORDER — METHYLPREDNISOLONE ACETATE 80 MG/ML IJ SUSP
80.0000 mg | Freq: Once | INTRAMUSCULAR | Status: AC
  Administered 2018-08-23: 80 mg via INTRAMUSCULAR

## 2018-08-23 NOTE — Progress Notes (Signed)
Subjective:   Emily Wagner is a 84 y.o. female who presents for Medicare Annual (Subsequent) preventive examination. 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 She also used to work for [employer] in town. She enjoys cooking and yard work. She does not get any exercise currently. She states that her diet is healthy and all she eats is home cooked. She is active in church and she lives at home alone. She does have 1 daughter that lives local, 2 grandsons and 2 great-granddaughters. She does not have any pets and fall hazards were discussed today. She states that her health is worse than it was a year ago.    Cardiac Risk Factors include: advanced age (>72men, >24 women);hypertension;dyslipidemia     Objective:     Vitals: BP (!) 140/97 (BP Location: Left Arm)   Wagner (!) 113   Temp (!) 97.5 F (36.4 C) (Oral)   Ht 5\' 4"  (1.626 m)   Wt 172 lb (78 kg)   LMP 03/21/1971   BMI 29.52 kg/m   Body mass index is 29.52 kg/m.  Advanced Directives 08/23/2018 10/29/2015 10/21/2015 05/31/2015 05/30/2015 10/04/2014  Does Patient Have a Medical Advance Directive? No;Yes Yes No Yes Yes Yes  Type of Paramedic of [place];Living will - - Living will - [place]  Does patient want to make changes to medical advance directive? - - - No - Patient declined - -  Copy of [place] in Chart? No - copy requested - - No - copy requested - -  Would patient like information on creating a medical advance directive? No - Patient declined - - - - -    Tobacco Social History   Tobacco Use  Smoking Status Never Smoker  Smokeless Tobacco Never Used     Counseling given: Not Answered   Past Medical History:  Diagnosis Date  . Adjustment disorder with mixed anxiety and depressed mood   . Atrial fibrillation ([place]) 11/04/2015   Post-operative  . Benign hypertensive heart disease without heart failure   . CAD (coronary  artery disease)   . Infection of urinary tract 10/31/2015  . Interstitial cystitis   . Osteopenia   . Other and unspecified hyperlipidemia   . Other dyspnea and respiratory abnormality   . Pelvic fracture ([place]) 05/30/2015  . 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 ABDOMINAL HYSTERECTOMY     Cancer and endometriosis  . CARDIAC CATHETERIZATION  2008  . CARDIAC CATHETERIZATION N/A 10/29/2015   Procedure: Left Heart Cath and Coronary Angiography;  Surgeon: Belva Crome, MD;  Location: [facility];  Service: Cardiovascular;  Laterality: N/A;  . CATARACT EXTRACTION    . CHOLECYSTECTOMY    . CORONARY ARTERY BYPASS GRAFT N/A 11/02/2015   Procedure: OFF PUMP CORONARY ARTERY BYPASS GRAFTING (CABG) TIMES ONE;  Surgeon: Rexene Alberts, MD;  Location: [place];  Service: Open Heart Surgery;  Laterality: N/A;  LIMA to LAD  . FRACTURE SURGERY     Right hip  . HERNIA REPAIR     Right inguinal  . TEE WITHOUT CARDIOVERSION N/A 11/02/2015   Procedure: TRANSESOPHAGEAL ECHOCARDIOGRAM (TEE);  Surgeon: Rexene Alberts, MD;  Location: [facility];  Service: Open Heart Surgery;  Laterality: N/A;   Family History  Problem Relation Age of Onset  . CAD Mother 66  Died suddenly  . CAD Father 12       Died suddenly  . CAD Daughter 15  . Diabetes type I Daughter        Since age 49  . Heart disease Sister   . Osteoporosis Sister    Social History   Socioeconomic History  . Marital status: Widowed    Spouse name: Not on file  . Number of children: 2  . Years of education: Not on file  . Highest education level: Not on file  Occupational History  . Occupation: retired    Fish farm manager: [employer]    Comment: admin. assistant  Social Needs  . Financial resource strain: Not on file  . Food insecurity:    Worry: Not on file    Inability: Not on file  . Transportation needs:    Medical: Not on file    Non-medical: Not on file  Tobacco Use    . Smoking status: Never Smoker  . Smokeless tobacco: Never Used  Substance and Sexual Activity  . Alcohol use: No  . Drug use: No  . Sexual activity: Not on file  Lifestyle  . Physical activity:    Days per week: Not on file    Minutes per session: Not on file  . Stress: Not on file  Relationships  . Social connections:    Talks on phone: Not on file    Gets together: Not on file    Attends religious service: Not on file    Active member of club or organization: Not on file    Attends meetings of clubs or organizations: Not on file    Relationship status: Not on file  Other Topics Concern  . Not on file  Social History Narrative   Lived with daughter who has had a massive MI and stroke. - passes away    Outpatient Encounter Medications as of 08/23/2018  Medication Sig  . aspirin EC 81 MG tablet Take 81 mg by mouth daily.  . Cholecalciferol (VITAMIN D) 2000 UNITS CAPS Take 2,000 Units by mouth daily.   . Cyanocobalamin (VITAMIN B-12 PO) Take 600 mcg by mouth every morning.   . fexofenadine (ALLEGRA) 180 MG tablet Take 180 mg by mouth daily.  . hydrochlorothiazide (HYDRODIURIL) 25 MG tablet Take 1 tablet (25 mg total) by mouth daily.  Marland Kitchen levothyroxine (SYNTHROID, LEVOTHROID) 50 MCG tablet Take 1 tablet (50 mcg total) by mouth daily before breakfast.  . LORazepam (ATIVAN) 0.5 MG tablet Take 1 tablet (0.5 mg total) by mouth 2 (two) times daily as needed.  . ranitidine (ZANTAC) 150 MG tablet Take 150 mg by mouth 3 (three) times daily.  Marland Kitchen venlafaxine XR (EFFEXOR XR) 37.5 MG 24 hr capsule Take 1 capsule (37.5 mg total) by mouth daily with breakfast.  . [DISCONTINUED] amLODipine (NORVASC) 5 MG tablet Take 1 tablet (5 mg total) daily by mouth. (Patient not taking: Reported on 08/14/2018)  . [DISCONTINUED] Diphenhyd-Hydrocort-Nystatin (FIRST-DUKES MOUTHWASH) SUSP Swish and spit 5 ml QID after meals and at bedtime  . [DISCONTINUED] Multiple Vitamins-Minerals (PRESERVISION AREDS PO) Take 1  tablet by mouth every other day.    No facility-administered encounter medications on file as of 08/23/2018.     Activities of Daily Living In your present state of health, do you have any difficulty performing the following activities: 08/23/2018  Hearing? Y  Comment wears hearing aids   Vision? Y  Comment wears glasses   Difficulty concentrating or making decisions? N  Walking or  climbing stairs? Y  Comment knee pain   Dressing or bathing? N  Doing errands, shopping? N  Preparing Food and eating ? N  Using the Toilet? N  In the past six months, have you accidently leaked urine? N  Do you have problems with loss of bowel control? N  Managing your Medications? N  Managing your Finances? N  Housekeeping or managing your Housekeeping? N  Some recent data might be hidden    Patient Care Team: Chipper Herb, MD as PCP - General (Family Medicine) Irine Seal, MD (Urology) Minus Breeding, MD (Cardiology) Steffanie Rainwater, DPM as Consulting Physician (Podiatry)    Assessment:   This is a routine wellness examination for Emily Wagner.  Exercise Activities and Dietary recommendations Current Exercise Habits: The patient does not participate in regular exercise at present  Goals    . Prevent falls     Stay active Get some energy back        Fall Risk Fall Risk  08/23/2018 07/09/2018 03/13/2018 03/05/2018 12/11/2017  Falls in the past year? No No Yes No No  Number falls in past yr: - - 1 - -  Injury with Fall? - - Yes - -  Comment - - right knee pain - -  Risk Factor Category  - - - - -  Risk for fall due to : - - - - -  Follow up - - - - -   Is the patient's home free of loose throw rugs in walkways, pet beds, electrical cords, etc?  Fall risks were discussed at length today  Depression Screen PHQ 2/9 Scores 08/23/2018 07/09/2018 03/05/2018 12/11/2017  PHQ - 2 Score 0 0 0 1     Cognitive Function MMSE - Mini Mental State Exam 08/23/2018  Orientation to time 5  Orientation to Place 5    Registration 3  Attention/ Calculation 5  Recall 3  Language- name 2 objects 2  Language- repeat 1  Language- follow 3 step command 3  Language- read & follow direction 1  Write a sentence 1  Copy design 1  Total score 30        Immunization History  Administered Date(s) Administered  . Influenza Whole 07/28/2010  . Influenza, High Dose Seasonal PF 07/15/2016, 09/03/2017  . Influenza,inj,Quad PF,6+ Mos 09/23/2013, 09/09/2014, 09/07/2015  . Pneumococcal Conjugate-13 05/14/2014  . Td 04/14/2008    Qualifies for Shingles Vaccine? Declined   Screening Tests Health Maintenance  Topic Date Due  . INFLUENZA VACCINE  09/07/2018 (Originally 06/27/2018)  . PNA vac Low Risk Adult (2 of 2 - PPSV23) 12/11/2018 (Originally 05/15/2015)  . TETANUS/TDAP  07/10/2019 (Originally 04/14/2018)  . DEXA SCAN  Completed    Cancer Screenings: Lung: Low Dose CT Chest recommended if Age 64-80 years, 30 pack-year currently smoking OR have quit w/in 15years. Patient does not qualify. Breast:  Up to date on Mammogram? No   Up to date of Bone Density/Dexa? Yes Colorectal: done 03/2018.  Additional Screenings: declined  Hepatitis C Screening:      Plan:   pt is to continue eating healthy, try to get active and try to prevent falls. She declined all vaccines today She will keep follow up with Dr Laurance Flatten  I have personally reviewed and noted the following in the patient's chart:   . Medical and social history . Use of alcohol, tobacco or illicit drugs  . Current medications and supplements . Functional ability and status . Nutritional status . Physical activity . Advanced  directives . List of other physicians . Hospitalizations, surgeries, and ER visits in previous 12 months . Vitals . Screenings to include cognitive, depression, and falls . Referrals and appointments  In addition, I have reviewed and discussed with patient certain preventive protocols, quality metrics, and best practice  recommendations. A written personalized care plan for preventive services as well as general preventive health recommendations were provided to patient.     [place] Dec, [place]  [id number]

## 2018-08-23 NOTE — Telephone Encounter (Signed)
She brings in the pamphlet from the pharm on the medication and it does states that TREMOR is a possible side effect of Venlafaxine XR 37.5. She likes that it makes her feel better - but the tremors are Terrible. She is unable to write much at all. She had to have her daughter write all of her checks for her. Can this be changed to something else? [facility].  Aware not to stop or make changes without speaking to our office first.

## 2018-08-23 NOTE — Patient Instructions (Signed)
  Emily Wagner , Thank you for taking time to come for your Medicare Wellness Visit. I appreciate your ongoing commitment to your health goals. Please review the following plan we discussed and let me know if I can assist you in the future.   These are the goals we discussed: Goals    . Prevent falls     Stay active Get some energy back        This is a list of the screening recommended for you and due dates:  Health Maintenance  Topic Date Due  . Flu Shot  09/07/2018*  . Pneumonia vaccines (2 of 2 - PPSV23) 12/11/2018*  . Tetanus Vaccine  07/10/2019*  . DEXA scan (bone density measurement)  Completed  *Topic was postponed. The date shown is not the original due date.    Continue to eat healthy Stay as active as you can Try to prevent falls   Keep follow up with Dr Laurance Flatten and other specialist

## 2018-08-23 NOTE — Progress Notes (Signed)
BP 138/89   Pulse (!) 113   Temp (!) 97.5 F (36.4 C) (Oral)   Ht 5\' 4"  (1.626 m)   Wt 172 lb (78 kg)   LMP 03/21/1971   BMI 29.52 kg/m    Subjective:    Patient ID: Emily Wagner, female    DOB: [birth year], 84 y.o.   MRN: [id number]  HPI: Emily Wagner is a 84 y.o. female presenting on 08/23/2018 for Knee Pain (Left- wants injection)   HPI Left knee pain/arthritis Patient comes in for continued left knee pain arthritis, she has had injections before that helped and her last one was in June which is about 4 months ago and she is coming in today because she started to get pain over the past week again and she wants to get another injection.  She denies any fevers or chills or shortness of breath or wheezing.  She denies any redness or warmth.  The pain is mostly on the medial aspect of her left knee and is worse when she is walking her up on her feet or moving around.  Relevant past medical, surgical, family and social history reviewed and updated as indicated. Interim medical history since our last visit reviewed. Allergies and medications reviewed and updated.  Review of Systems  Constitutional: Negative for chills and fever.  Eyes: Negative for visual disturbance.  Respiratory: Negative for chest tightness and shortness of breath.   Cardiovascular: Negative for chest pain and leg swelling.  Musculoskeletal: Positive for arthralgias and joint swelling. Negative for back pain and gait problem.  Skin: Negative for rash.  Neurological: Negative for light-headedness and headaches.  Psychiatric/Behavioral: Negative for agitation and behavioral problems.  All other systems reviewed and are negative.   Per HPI unless specifically indicated above        Objective:    BP 138/89   Pulse (!) 113   Temp (!) 97.5 F (36.4 C) (Oral)   Ht 5\' 4"  (1.626 m)   Wt 172 lb (78 kg)   LMP 03/21/1971   BMI 29.52 kg/m   Wt Readings from Last 3 Encounters:  08/23/18 172 lb (78  kg)  08/23/18 172 lb (78 kg)  08/14/18 172 lb (78 kg)    Physical Exam  Constitutional: She is oriented to person, place, and time. She appears well-developed and well-nourished. No distress.  Eyes: Conjunctivae are normal.  Musculoskeletal: Normal range of motion. She exhibits no edema.       Left knee: She exhibits effusion (Mild effusion more on the lateral aspect). She exhibits normal range of motion, normal alignment, no LCL laxity, normal patellar mobility, normal meniscus and no MCL laxity. Tenderness found. Medial joint line tenderness noted.  Neurological: She is alert and oriented to person, place, and time. Coordination normal.  Skin: Skin is warm and dry. No rash noted. She is not diaphoretic.  Psychiatric: She has a normal mood and affect. Her behavior is normal.  Nursing note and vitals reviewed.   Knee injection: Consent form signed. Risk factors of bleeding and infection discussed with patient and patient is agreeable towards injection. Patient prepped with Betadine. Lateral approach towards injection used. Injected 80 mg of Depo-Medrol and 1 mL of 2% lidocaine. Patient tolerated procedure well and no side effects from noted. Minimal to no bleeding. Simple bandage applied after.     Assessment & Plan:   Problem List Items Addressed This Visit    None    Visit Diagnoses  Chronic pain of left knee    -  Primary   Relevant Medications   methylPREDNISolone acetate (DEPO-MEDROL) injection 80 mg (Completed) (Start on 08/23/2018 12:00 PM)    Likely osteoarthritis based on last x-ray imaging, did steroid injection recommended that she wait at least 4 months before the next 1  Follow up plan: Return if symptoms worsen or fail to improve.  Counseling provided for all of the vaccine components No orders of the defined types were placed in this encounter.   Caryl Pina, MD [facility] 08/23/2018, 11:57 AM

## 2018-08-25 NOTE — Telephone Encounter (Signed)
If the tremors are continuing, discontinue this by taking 1 every other day for 10 days and then discontinuing it completely.  Call back at that time we will consider trying Lexapro.  But not until she is off of the Effexor extended release.

## 2018-08-26 NOTE — Telephone Encounter (Signed)
Pt aware - she will call us on the 10th day and let us know if tremors are better.

## 2018-08-27 LAB — LYME AB/WESTERN BLOT REFLEX
LYME DISEASE AB, QUANT, IGM: 0.98 index — ABNORMAL HIGH (ref 0.00–0.79)
Lyme IgG/IgM Ab: <0.91 {ISR} (ref 0.00–0.90)

## 2018-08-27 LAB — LYME, WESTERN BLOT, SERUM (REFLEXED)
IGG P39 AB.: ABSENT
IGG P41 AB.: ABSENT
IGG P45 AB.: ABSENT
IGG P58 AB.: ABSENT
IGG P66 AB.: ABSENT
IGM P41 AB.: ABSENT
IgG P28 Ab.: ABSENT
IgG P30 Ab.: ABSENT
IgM P23 Ab.: ABSENT
IgM P39 Ab.: ABSENT
LYME IGM WB: NEGATIVE
Lyme IgG Wb: NEGATIVE

## 2018-08-27 LAB — ROCKY MTN SPOTTED FVR ABS PNL(IGG+IGM)
RMSF IGG: NEGATIVE
RMSF IGM: 1.93 {index} — AB (ref 0.00–0.89)

## 2018-08-30 ENCOUNTER — Other Ambulatory Visit: Payer: Self-pay | Admitting: Cardiology

## 2018-09-04 ENCOUNTER — Telehealth: Payer: Self-pay | Admitting: *Deleted

## 2018-09-04 MED ORDER — PAROXETINE HCL 10 MG PO TABS: 10.0000 mg | ORAL_TABLET | Freq: Every day | ORAL | 3 refills | Status: DC

## 2018-09-04 NOTE — Telephone Encounter (Signed)
Try Paxil 10 if she is not tried this in the past 1 daily

## 2018-09-04 NOTE — Telephone Encounter (Signed)
Patient aware of recommendation.  

## 2018-09-06 ENCOUNTER — Ambulatory Visit: Payer: Medicare Other | Admitting: Urology

## 2018-09-06 DIAGNOSIS — N3020 Other chronic cystitis without hematuria: Secondary | ICD-10-CM

## 2018-09-06 DIAGNOSIS — R3911 Hesitancy of micturition: Secondary | ICD-10-CM | POA: Diagnosis not present

## 2018-09-17 ENCOUNTER — Telehealth: Payer: Self-pay | Admitting: Family Medicine

## 2018-09-17 ENCOUNTER — Other Ambulatory Visit: Payer: Self-pay | Admitting: Family Medicine

## 2018-09-17 NOTE — Telephone Encounter (Signed)
Pt called and aware - pepcid 20 mg OTC 1-2 daily PRN

## 2018-09-25 ENCOUNTER — Telehealth: Payer: Self-pay | Admitting: Family Medicine

## 2018-09-25 MED ORDER — FAMOTIDINE 20 MG PO TABS: 20.0000 mg | ORAL_TABLET | Freq: Two times a day (BID) | ORAL | 1 refills | Status: DC

## 2018-09-25 NOTE — Telephone Encounter (Signed)
OTC pepcid will be cheaper with rx - RX sent

## 2018-10-03 ENCOUNTER — Telehealth: Payer: Self-pay | Admitting: Family Medicine

## 2018-10-03 NOTE — Telephone Encounter (Signed)
Pt called with answer from [place].  She will decrease her paxil over the next 2 weeks due to it causing diarrhea and tremors

## 2018-10-21 ENCOUNTER — Telehealth: Payer: Self-pay | Admitting: Family Medicine

## 2018-10-21 NOTE — Telephone Encounter (Signed)
Called patient to see if I could help.  She refuses to speak with anyone but [place].

## 2018-10-21 NOTE — Telephone Encounter (Signed)
Pt states that she is so weak and is having awful headaches.  Back of head hurts   States that she does not have money to go to Neurology.

## 2018-10-22 DIAGNOSIS — M9905 Segmental and somatic dysfunction of pelvic region: Secondary | ICD-10-CM | POA: Diagnosis not present

## 2018-10-22 DIAGNOSIS — M9904 Segmental and somatic dysfunction of sacral region: Secondary | ICD-10-CM | POA: Diagnosis not present

## 2018-10-22 DIAGNOSIS — M9903 Segmental and somatic dysfunction of lumbar region: Secondary | ICD-10-CM | POA: Diagnosis not present

## 2018-10-22 DIAGNOSIS — M5430 Sciatica, unspecified side: Secondary | ICD-10-CM | POA: Diagnosis not present

## 2018-10-22 NOTE — Telephone Encounter (Signed)
Pt went to Dr. Andres Labrum, Chiropractor today, headache is gone, feels much better. She would still like to come in in the morning and have labs done

## 2018-10-22 NOTE — Telephone Encounter (Signed)
Make sure that patient has had recent labs to include CBC, bmp, thyroid profile, B-12 and CXR and U/A

## 2018-10-23 ENCOUNTER — Ambulatory Visit (INDEPENDENT_AMBULATORY_CARE_PROVIDER_SITE_OTHER): Payer: Medicare Other

## 2018-10-23 ENCOUNTER — Other Ambulatory Visit: Payer: Self-pay | Admitting: *Deleted

## 2018-10-23 ENCOUNTER — Other Ambulatory Visit: Payer: Medicare Other

## 2018-10-23 DIAGNOSIS — R519 Headache, unspecified: Secondary | ICD-10-CM

## 2018-10-23 DIAGNOSIS — R5383 Other fatigue: Secondary | ICD-10-CM

## 2018-10-23 DIAGNOSIS — R51 Headache: Principal | ICD-10-CM

## 2018-10-23 LAB — URINALYSIS, COMPLETE
Bilirubin, UA: NEGATIVE
GLUCOSE, UA: NEGATIVE
Nitrite, UA: NEGATIVE
PH UA: 5 (ref 5.0–7.5)
Specific Gravity, UA: 1.025 (ref 1.005–1.030)
Urobilinogen, Ur: 0.2 mg/dL (ref 0.2–1.0)

## 2018-10-23 LAB — MICROSCOPIC EXAMINATION

## 2018-10-23 NOTE — Telephone Encounter (Signed)
Labs and cxr ordered

## 2018-10-24 LAB — CBC WITH DIFFERENTIAL/PLATELET
BASOS ABS: 0.1 10*3/uL (ref 0.0–0.2)
Basos: 1 %
EOS (ABSOLUTE): 0.1 10*3/uL (ref 0.0–0.4)
Eos: 2 %
HEMOGLOBIN: 14.6 g/dL (ref 11.1–15.9)
Hematocrit: 42.8 % (ref 34.0–46.6)
IMMATURE GRANS (ABS): 0 10*3/uL (ref 0.0–0.1)
Immature Granulocytes: 0 %
LYMPHS: 18 %
Lymphocytes Absolute: 1.2 10*3/uL (ref 0.7–3.1)
MCH: 31.7 pg (ref 26.6–33.0)
MCHC: 34.1 g/dL (ref 31.5–35.7)
MCV: 93 fL (ref 79–97)
MONOCYTES: 9 %
Monocytes Absolute: 0.6 10*3/uL (ref 0.1–0.9)
Neutrophils Absolute: 4.6 10*3/uL (ref 1.4–7.0)
Neutrophils: 70 %
PLATELETS: 254 10*3/uL (ref 150–450)
RBC: 4.6 x10E6/uL (ref 3.77–5.28)
RDW: 12.8 % (ref 12.3–15.4)
WBC: 6.5 10*3/uL (ref 3.4–10.8)

## 2018-10-24 LAB — BMP8+EGFR
BUN/Creatinine Ratio: 16 (ref 12–28)
BUN: 19 mg/dL (ref 8–27)
CALCIUM: 9.3 mg/dL (ref 8.7–10.3)
CHLORIDE: 100 mmol/L (ref 96–106)
CO2: 22 mmol/L (ref 20–29)
Creatinine, Ser: 1.19 mg/dL — ABNORMAL HIGH (ref 0.57–1.00)
GFR calc non Af Amer: 42 mL/min/{1.73_m2} — ABNORMAL LOW (ref >59)
GFR, EST AFRICAN AMERICAN: 49 mL/min/{1.73_m2} — AB (ref >59)
GLUCOSE: 92 mg/dL (ref 65–99)
Potassium: 3.5 mmol/L (ref 3.5–5.2)
Sodium: 140 mmol/L (ref 134–144)

## 2018-10-24 LAB — THYROID PANEL WITH TSH
Free Thyroxine Index: 2.7 (ref 1.2–4.9)
T3 UPTAKE RATIO: 30 % (ref 24–39)
T4, Total: 9 ug/dL (ref 4.5–12.0)
TSH: 2.37 u[IU]/mL (ref 0.450–4.500)

## 2018-10-24 LAB — VITAMIN B12: Vitamin B-12: >2000 pg/mL — ABNORMAL HIGH (ref 232–1245)

## 2018-11-01 ENCOUNTER — Other Ambulatory Visit: Payer: Self-pay | Admitting: Family Medicine

## 2018-11-06 ENCOUNTER — Other Ambulatory Visit: Payer: Self-pay | Admitting: Family Medicine

## 2018-11-06 NOTE — Telephone Encounter (Signed)
Last seen 08/23/18

## 2018-12-06 ENCOUNTER — Ambulatory Visit (INDEPENDENT_AMBULATORY_CARE_PROVIDER_SITE_OTHER): Payer: Medicare Other | Admitting: Family Medicine

## 2018-12-06 ENCOUNTER — Encounter: Payer: Self-pay | Admitting: Family Medicine

## 2018-12-06 VITALS — BP 131/76 | HR 85 | Temp 97.5°F | Ht 64.0 in | Wt 172.0 lb

## 2018-12-06 DIAGNOSIS — M25552 Pain in left hip: Secondary | ICD-10-CM

## 2018-12-06 DIAGNOSIS — N390 Urinary tract infection, site not specified: Secondary | ICD-10-CM

## 2018-12-06 DIAGNOSIS — M25562 Pain in left knee: Secondary | ICD-10-CM

## 2018-12-06 DIAGNOSIS — E7800 Pure hypercholesterolemia, unspecified: Secondary | ICD-10-CM

## 2018-12-06 DIAGNOSIS — G8929 Other chronic pain: Secondary | ICD-10-CM

## 2018-12-06 DIAGNOSIS — R5381 Other malaise: Secondary | ICD-10-CM

## 2018-12-06 DIAGNOSIS — I480 Paroxysmal atrial fibrillation: Secondary | ICD-10-CM | POA: Diagnosis not present

## 2018-12-06 DIAGNOSIS — R5382 Chronic fatigue, unspecified: Secondary | ICD-10-CM

## 2018-12-06 DIAGNOSIS — E559 Vitamin D deficiency, unspecified: Secondary | ICD-10-CM | POA: Diagnosis not present

## 2018-12-06 DIAGNOSIS — K219 Gastro-esophageal reflux disease without esophagitis: Secondary | ICD-10-CM

## 2018-12-06 DIAGNOSIS — M25551 Pain in right hip: Secondary | ICD-10-CM

## 2018-12-06 DIAGNOSIS — I2510 Atherosclerotic heart disease of native coronary artery without angina pectoris: Secondary | ICD-10-CM | POA: Diagnosis not present

## 2018-12-06 DIAGNOSIS — M25561 Pain in right knee: Secondary | ICD-10-CM

## 2018-12-06 DIAGNOSIS — I10 Essential (primary) hypertension: Secondary | ICD-10-CM

## 2018-12-06 MED ORDER — MECLIZINE HCL 12.5 MG PO TABS: 12.5000 mg | ORAL_TABLET | Freq: Three times a day (TID) | ORAL | 0 refills | Status: DC | PRN

## 2018-12-06 MED ORDER — PREDNISONE 10 MG PO TABS: ORAL_TABLET | ORAL | 0 refills | Status: DC

## 2018-12-06 MED ORDER — CIPROFLOXACIN HCL 500 MG PO TABS: 500.0000 mg | ORAL_TABLET | Freq: Every day | ORAL | 0 refills | Status: DC

## 2018-12-06 MED ORDER — LEVOTHYROXINE SODIUM 50 MCG PO TABS: 50.0000 ug | ORAL_TABLET | Freq: Every day | ORAL | 3 refills | Status: DC

## 2018-12-06 MED ORDER — FAMOTIDINE 20 MG PO TABS: 20.0000 mg | ORAL_TABLET | Freq: Two times a day (BID) | ORAL | 3 refills | Status: DC

## 2018-12-06 MED ORDER — LORAZEPAM 0.5 MG PO TABS: 0.5000 mg | ORAL_TABLET | Freq: Two times a day (BID) | ORAL | 1 refills | Status: DC | PRN

## 2018-12-06 NOTE — Progress Notes (Signed)
Subjective:    Patient ID: [name], female    DOB: 09-10-1933, 86 y.o.   MRN: [id number]  HPI Pt here for follow up and management of chronic medical problems which includes hypertension and hyperlipidemia. He is taking medication regularly.  Patient brings outside blood pressure reports to the visit and all of these are good.  She complains of joint pain left knee pain along with fatigue and weakness.  She would like either an injection or some prednisone for the joint pain.  Her vital signs are stable.  She has had lab work done this will be reviewed with her during the visit today.  The blood sugar is good at 92.  The creatinine was stable at 1.19 and slightly elevated.  All of the electrolytes including potassium are good.  The B12 level was good and greater than 2000.  All thyroid tests were normal.  See had a normal white blood cell count and good hemoglobin at 14.6.  The platelet count was adequate.  The urinalysis had 1+ leukocytes and 2+ red blood cells.  There were 6-10 WBC and 11-30 RBCs.  She has had chronic urinary tract infections in the past.  The patient has been routinely followed by the urologist.  She has a history of hyperlipidemia hypertension and atrial fibrillation and has had hip fractures and is followed regularly by the cardiologist and urologist.  She has coronary artery disease.  She has allergies to lots of medication.  She is currently on amlodipine vitamin D 2000 units vitamin B12 by mouth Pepcid AC Allegra HCTZ levothyroxine Lorazepam and multivitamins.    Patient Active Problem List   Diagnosis Date Noted  . Aortic atherosclerosis ([place]) 12/12/2017  . SOB (shortness of breath) 05/17/2017  . Long term (current) use of anticoagulants 11/11/2015  . Atrial fibrillation ([place]) 11/04/2015  . 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 Infection of urinary tract 10/31/2015  . Unstable angina ([place]) 10/29/2015  . CAD (coronary artery disease) 10/29/2015  . Benign  hypertensive heart disease without heart failure   . Interstitial cystitis   . Osteopenia   . Pelvic fracture ([place]) 05/30/2015  . Allergy to multiple medications 07/15/2013  . Renal insufficiency   . Adjustment disorder with mixed anxiety and depressed mood   . Osteoporosis   . ASCVD (arteriosclerotic cardiovascular disease)   . 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  . HYPERCHOLESTEROLEMIA 05/02/2008  . Essential hypertension 05/02/2008  . INTERSTITIAL CYSTITIS 05/02/2008  . DIVERTICULOSIS OF COLON 08/27/2003   Outpatient Encounter Medications as of 12/06/2018  Medication Sig  . amLODipine (NORVASC) 5 MG tablet TAKE 1 TABLET (5 MG TOTAL) DAILY BY MOUTH.  Marland Kitchen aspirin EC 81 MG tablet Take 81 mg by mouth daily.  . Cholecalciferol (VITAMIN D) 2000 UNITS CAPS Take 2,000 Units by mouth daily.   . Cyanocobalamin (VITAMIN B-12 PO) Take 600 mcg by mouth every morning.   . famotidine (PEPCID) 20 MG tablet Take 1 tablet (20 mg total) by mouth 2 (two) times daily.  . fexofenadine (ALLEGRA) 180 MG tablet Take 180 mg by mouth daily.  . hydrochlorothiazide (HYDRODIURIL) 25 MG tablet Take 1 tablet (25 mg total) by mouth daily.  Marland Kitchen levothyroxine (SYNTHROID, LEVOTHROID) 50 MCG tablet Take 1 tablet (50 mcg total) by mouth daily before breakfast.  . LORazepam (ATIVAN) 0.5 MG tablet TAKE 1 TABLET (0.5 MG TOTAL) BY MOUTH 2 (TWO) TIMES DAILY AS NEEDED.  . Multiple Vitamins-Minerals (PRESERVISION AREDS 2 PO) Take 1 capsule by mouth  at bedtime.  . [DISCONTINUED] LORazepam (ATIVAN) 0.5 MG tablet TAKE 1 TABLET (0.5 MG TOTAL) BY MOUTH 2 (TWO) TIMES DAILY AS NEEDED.  . [DISCONTINUED] PARoxetine (PAXIL) 10 MG tablet Take 1 tablet (10 mg total) by mouth daily.  . [DISCONTINUED] ranitidine (ZANTAC) 150 MG tablet Take 150 mg by mouth 3 (three) times daily.  . [DISCONTINUED] venlafaxine XR (EFFEXOR-XR) 37.5 MG 24 hr capsule TAKE 1 CAPSULE BY MOUTH  DAILY WITH BREAKFAST   No facility-administered encounter medications on file as  of 12/06/2018.       Review of Systems  Constitutional: Positive for fatigue.  HENT: Negative.   Eyes: Negative.   Respiratory: Negative.   Cardiovascular: Negative.   Gastrointestinal: Negative.   Endocrine: Negative.   Genitourinary: Negative.   Musculoskeletal: Positive for arthralgias (joint pain /// left inner knee pain ).  Skin: Negative.   Allergic/Immunologic: Negative.   Neurological: Positive for weakness.  Hematological: Negative.   Psychiatric/Behavioral: Negative.        Objective:   Physical Exam Vitals signs and nursing note reviewed.  Constitutional:      Appearance: Normal appearance. She is well-developed. She is obese. She is not ill-appearing.     Comments: The patient is pleasant and just frustrated with her malaise and fatigue and the cost of taking care of herself medically.  She continues to see Dr. Percival Spanish and the urologist regularly.  HENT:     Head: Normocephalic and atraumatic.     Right Ear: Tympanic membrane, ear canal and external ear normal. There is no impacted cerumen.     Left Ear: Tympanic membrane, ear canal and external ear normal. There is no impacted cerumen.     Ears:     Comments: Bilateral hearing aids    Nose: Nose normal.     Mouth/Throat:     Mouth: Mucous membranes are moist.     Pharynx: Oropharynx is clear.  Eyes:     General: No scleral icterus.       Right eye: No discharge.        Left eye: No discharge.     Extraocular Movements: Extraocular movements intact.     Conjunctiva/sclera: Conjunctivae normal.     Pupils: Pupils are equal, round, and reactive to light.  Neck:     Musculoskeletal: Normal range of motion and neck supple.     Thyroid: No thyromegaly.     Vascular: No carotid bruit or JVD.     Comments: No bruits thyromegaly or anterior cervical adenopathy Cardiovascular:     Rate and Rhythm: Normal rate and regular rhythm.     Heart sounds: Normal heart sounds. No murmur. No friction rub. No gallop.       Comments: Heart is regular today at 72/min with good pedal pulses.  And no significant edema bilaterally. Pulmonary:     Effort: Pulmonary effort is normal. No respiratory distress.     Breath sounds: Normal breath sounds. No wheezing or rales.     Comments: Clear anteriorly and posteriorly Abdominal:     General: Abdomen is flat. Bowel sounds are normal.     Palpations: Abdomen is soft. There is no mass.     Tenderness: There is no abdominal tenderness. There is no guarding.     Comments: No masses tenderness organ enlargement or bruits.  No suprapubic tenderness.  Musculoskeletal:        General: Swelling and tenderness present.     Right lower leg: No edema.  Left lower leg: Edema present.     Comments: Has a lot of stiffness and pain in the left knee on the exam today.  She is specifically tender in the medial aspect of the knee and has had surgery on this knee in the past.  We will do the course of prednisone and schedule her for a visit with the orthopedic surgeon that comes here for further evaluation if she is not better with oral  prednisone.  Lymphadenopathy:     Cervical: No cervical adenopathy.  Skin:    General: Skin is warm and dry.     Findings: No rash.  Neurological:     General: No focal deficit present.     Mental Status: She is alert and oriented to person, place, and time. Mental status is at baseline.     Cranial Nerves: No cranial nerve deficit.     Deep Tendon Reflexes: Reflexes are normal and symmetric. Reflexes normal.  Psychiatric:        Mood and Affect: Mood normal.        Behavior: Behavior normal.        Thought Content: Thought content normal.        Judgment: Judgment normal.     Comments: Mood affect and behavior are normal and patient remains very frustrated with her weakness and the chronicity of her health condition at her 85 years of age.    BP 131/76 (BP Location: Left Arm)   Pulse 85   Temp (!) 97.5 F (36.4 C) (Oral)   Ht 5\' 4"  (1.626  m)   Wt 172 lb (78 kg)   LMP 03/21/1971   BMI 29.52 kg/m         Assessment & Plan:  1. HYPERCHOLESTEROLEMIA -Continue with his aggressive therapeutic lifestyle changes as possible because of intolerance to statins.  2. Vitamin D deficiency -Continue with vitamin D replacement  3. ASCVD (arteriosclerotic cardiovascular disease) -Follow-up with cardiology every 6 months as planned  4. Gastroesophageal reflux disease, esophagitis presence not specified No complaints today with reflux and she will continue with Pepcid AC  5. Paroxysmal atrial fibrillation (H[facility]) -Follow-up with cardiology as planned  6. Essential hypertension -Blood pressure is good and she will continue with current treatment  7. Chronic arthralgias of knees and hips -Patient continues to have problems with osteoarthritis and especially with past problems with the left knee.  We will do a course of oral prednisone and she will arrange to see the orthopedic surgeon and follow-up if she is not better for possible injection of the knee at that time. - Ambulatory referral to Orthopedic Surgery  8. Recurrent urinary tract infection -Follow-up with urology as planned  9. Chronic fatigue and malaise -Lab work is been done recently and patient is reassured that all of the lab work studies looking at reasons for her chronic fatigue were normal.  She was encouraged to get more exercise and physical therapy but says she cannot afford to go to [facility] because of the co-pay.  She will continue to try to do this at home to the best of her ability.  Meds ordered this encounter  Medications  . meclizine (ANTIVERT) 12.5 MG tablet    Sig: Take 1 tablet (12.5 mg total) by mouth 3 (three) times daily as needed for dizziness.    Dispense:  90 tablet    Refill:  0  . ciprofloxacin (CIPRO) 500 MG tablet    Sig: Take 1 tablet (500 mg  total) by mouth daily with breakfast.    Dispense:  90 tablet    Refill:  0  .  famotidine (PEPCID) 20 MG tablet    Sig: Take 1 tablet (20 mg total) by mouth 2 (two) times daily.    Dispense:  180 tablet    Refill:  3  . levothyroxine (SYNTHROID, LEVOTHROID) 50 MCG tablet    Sig: Take 1 tablet (50 mcg total) by mouth daily before breakfast.    Dispense:  90 tablet    Refill:  3   Patient Instructions                       Medicare Annual Wellness Visit  [facility] and the medical providers at [facility] strive to bring you the best medical care.  In doing so we not only want to address your current medical conditions and concerns but also to detect new conditions early and prevent illness, disease and health-related problems.    Medicare offers a yearly Wellness Visit which allows our clinical staff to assess your need for preventative services including immunizations, lifestyle education, counseling to decrease risk of preventable diseases and screening for fall risk and other medical concerns.    This visit is provided free of charge (no copay) for all Medicare recipients. The clinical pharmacists at [facility] have begun to conduct these Wellness Visits which will also include a thorough review of all your medications.    As you primary medical provider recommend that you make an appointment for your Annual Wellness Visit if you have not done so already this year.  You may set up this appointment before you leave today or you may call back ([phone]) and schedule an appointment.  Please make sure when you call that you mention that you are scheduling your Annual Wellness Visit with the clinical pharmacist so that the appointment may be made for the proper length of time.     Continue current medications. Continue good therapeutic lifestyle changes which include good diet and exercise. Fall precautions discussed with patient. If an FOBT was given today- please return it to our front desk. If you are over 31 years old -  you may need Prevnar 52 or the adult Pneumonia vaccine.  **Flu shots are available--- please call and schedule a FLU-CLINIC appointment**  After your visit with [name] today you will receive a survey in the mail or online from [employer] regarding your care with [name]. Please take a moment to fill this out. Your feedback is very important to [name] as you can help [name] better understand your patient needs as well as improve your experience and satisfaction. WE CARE ABOUT YOU!!!   Continue as aggressive physical therapy as possible at home Drink plenty of water We will arrange for you to see Dr. Ethelene Browns the next time he is here because of ongoing left knee issues Take the prednisone that we are prescribing today as it directed Continue to be careful and do not put yourself at risk for falling  Arrie Senate MD

## 2018-12-06 NOTE — Patient Instructions (Addendum)
Medicare Annual Wellness Visit  [facility] and the medical providers at [facility] strive to bring you the best medical care.  In doing so we not only want to address your current medical conditions and concerns but also to detect new conditions early and prevent illness, disease and health-related problems.    Medicare offers a yearly Wellness Visit which allows our clinical staff to assess your need for preventative services including immunizations, lifestyle education, counseling to decrease risk of preventable diseases and screening for fall risk and other medical concerns.    This visit is provided free of charge (no copay) for all Medicare recipients. The clinical pharmacists at [facility] have begun to conduct these Wellness Visits which will also include a thorough review of all your medications.    As you primary medical provider recommend that you make an appointment for your Annual Wellness Visit if you have not done so already this year.  You may set up this appointment before you leave today or you may call back ([phone]) and schedule an appointment.  Please make sure when you call that you mention that you are scheduling your Annual Wellness Visit with the clinical pharmacist so that the appointment may be made for the proper length of time.     Continue current medications. Continue good therapeutic lifestyle changes which include good diet and exercise. Fall precautions discussed with patient. If an FOBT was given today- please return it to our front desk. If you are over 35 years old - you may need Prevnar 47 or the adult Pneumonia vaccine.  **Flu shots are available--- please call and schedule a FLU-CLINIC appointment**  After your visit with [name] today you will receive a survey in the mail or online from [employer] regarding your care with [name]. Please take a moment to fill this out. Your feedback is very  important to [name] as you can help [name] better understand your patient needs as well as improve your experience and satisfaction. WE CARE ABOUT YOU!!!   Continue as aggressive physical therapy as possible at home Drink plenty of water We will arrange for you to see Dr. Ethelene Browns the next time he is here because of ongoing left knee issues Take the prednisone that we are prescribing today as it directed Continue to be careful and do not put yourself at risk for falling

## 2018-12-12 DIAGNOSIS — H353131 Nonexudative age-related macular degeneration, bilateral, early dry stage: Secondary | ICD-10-CM | POA: Diagnosis not present

## 2018-12-12 DIAGNOSIS — H10413 Chronic giant papillary conjunctivitis, bilateral: Secondary | ICD-10-CM | POA: Diagnosis not present

## 2018-12-12 DIAGNOSIS — Z961 Presence of intraocular lens: Secondary | ICD-10-CM | POA: Diagnosis not present

## 2018-12-12 DIAGNOSIS — H04123 Dry eye syndrome of bilateral lacrimal glands: Secondary | ICD-10-CM | POA: Diagnosis not present

## 2018-12-20 ENCOUNTER — Other Ambulatory Visit: Payer: Self-pay | Admitting: Cardiology

## 2018-12-20 MED ORDER — HYDROCHLOROTHIAZIDE 25 MG PO TABS: 25.0000 mg | ORAL_TABLET | Freq: Every day | ORAL | 0 refills | Status: DC

## 2018-12-20 NOTE — Telephone Encounter (Signed)
°*  STAT* If patient is at the pharmacy, call can be transferred to refill team.   1. Which medications need to be refilled? (please list name of each medication and dose if known)  Hydrochlorothiazide  2. Which pharmacy/location (including street and city if local pharmacy) is medication to be sent to? [facility]  3. Do they need a 30 day or 90 day supply?90 and refills

## 2018-12-26 ENCOUNTER — Telehealth: Payer: Self-pay | Admitting: Family Medicine

## 2018-12-26 NOTE — Telephone Encounter (Signed)
Pt called

## 2018-12-26 NOTE — Telephone Encounter (Signed)
Attempted to contact patient and patient states "she only needed to speak to Emily Wagner".

## 2018-12-27 ENCOUNTER — Telehealth: Payer: Self-pay | Admitting: Family Medicine

## 2018-12-27 NOTE — Telephone Encounter (Signed)
Aware.  Can purchase over the counter remedies for gas.  Pepto bismol, gas-x, beano.

## 2019-01-24 ENCOUNTER — Other Ambulatory Visit: Payer: Self-pay | Admitting: Cardiology

## 2019-01-24 MED ORDER — HYDROCHLOROTHIAZIDE 25 MG PO TABS: 25.0000 mg | ORAL_TABLET | Freq: Every day | ORAL | 2 refills | Status: DC

## 2019-01-24 NOTE — Telephone Encounter (Signed)
Rx(s) sent to pharmacy electronically.  

## 2019-01-24 NOTE — Telephone Encounter (Signed)
°*  STAT* If patient is at the pharmacy, call can be transferred to refill team.   1. Which medications need to be refilled? (please list name of each medication and dose if known) hydrochlorothiazide (HYDRODIURIL) 25 MG tablet  2. Which pharmacy/location (including street and city if local pharmacy) is medication to be sent to? [place], [place]  3. Do they need a 30 day or 90 day supply? 90 days

## 2019-03-17 ENCOUNTER — Telehealth: Payer: Self-pay | Admitting: Family Medicine

## 2019-03-17 MED ORDER — PREDNISONE 10 MG PO TABS: ORAL_TABLET | ORAL | 0 refills | Status: DC

## 2019-03-17 NOTE — Telephone Encounter (Signed)
We can do 1 round of prednisone 10 mg taper over 8 days. 20.  She should be scheduled to see the orthopedist and let him do the injection or when he is here at his next visit.  If you do not know when he is going to be here than schedule that in [place].

## 2019-03-17 NOTE — Telephone Encounter (Signed)
Patient states her left knee is very painful and swollen.  States steroid injections usually help, but she has not had one since 07/2018.  She is requesting prednisone to be sent in to help with pain and swelling until her appointment with Dr. Laurance Flatten 5/19.  She uses [facility] in [place].

## 2019-03-17 NOTE — Telephone Encounter (Signed)
pred sent in - pt aware

## 2019-03-21 ENCOUNTER — Telehealth: Payer: Self-pay | Admitting: Family Medicine

## 2019-03-21 ENCOUNTER — Other Ambulatory Visit: Payer: Self-pay

## 2019-03-21 ENCOUNTER — Encounter: Payer: Self-pay | Admitting: Physician Assistant

## 2019-03-21 ENCOUNTER — Ambulatory Visit (INDEPENDENT_AMBULATORY_CARE_PROVIDER_SITE_OTHER): Payer: Medicare Other | Admitting: Physician Assistant

## 2019-03-21 VITALS — BP 179/106 | HR 74 | Temp 97.3°F | Ht 64.0 in | Wt 171.0 lb

## 2019-03-21 DIAGNOSIS — M1712 Unilateral primary osteoarthritis, left knee: Secondary | ICD-10-CM | POA: Diagnosis not present

## 2019-03-21 MED ORDER — METHYLPREDNISOLONE ACETATE 80 MG/ML IJ SUSP
80.0000 mg | Freq: Once | INTRAMUSCULAR | Status: AC
  Administered 2019-03-21: 10:00:00 80 mg via INTRAMUSCULAR

## 2019-03-21 NOTE — Progress Notes (Signed)
BP (!) 179/106   Pulse 74   Temp (!) 97.3 F (36.3 C) (Oral)   Ht 5\' 4"  (1.626 m)   Wt 171 lb (77.6 kg)   LMP 03/21/1971   BMI 29.35 kg/m    Subjective:    Patient ID: Emily Wagner, female    DOB: [birth year], 85 y.o.   MRN: [id number]  HPI: [name] is a 85 y.o. female presenting on 03/21/2019 for Knee Pain (left x 3 weeks)  This patient has had ongoing left knee pain off and on for a few years.  Her last injection to the knee was in September.  She states that it still bothers her at times.  But has been quite bad over the past 3 weeks.  She was started 5 days ago on a prednisone Dosepak, she states there is been no relief. She had gone to [facility] in the past but they are not coming to this office at this time.  She would like to see someone in [place].  We will make a referral to Dr. Aline Brochure to discuss long term injection or the possibility of other joint supplementation.   Past Medical History:  Diagnosis Date  . Adjustment disorder with mixed anxiety and depressed mood   . Atrial fibrillation ([place]) 11/04/2015   Post-operative  . Benign hypertensive heart disease without heart failure   . CAD (coronary artery disease)   . Infection of urinary tract 10/31/2015  . Interstitial cystitis   . Osteopenia   . Other and unspecified hyperlipidemia   . Other dyspnea and respiratory abnormality   . Pelvic fracture ([place]) 05/30/2015  . 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 Interim medical history since our last visit reviewed. Allergies and medications reviewed and updated. DATA REVIEWED: CHART IN EPIC  Family History reviewed for pertinent findings.  Review of Systems  Constitutional: Negative.   HENT: Negative.   Eyes: Negative.   Respiratory: Negative.   Gastrointestinal: Negative.   Genitourinary: Negative.   Musculoskeletal: Positive for arthralgias,  gait problem and joint swelling.    Allergies as of 03/21/2019      Reactions   Nitrofurantoin Other (See Comments)   Liver failure   Nsaids Other (See Comments)   Kidney failure   Other Other (See Comments)   Most antibiotics require supervision per patient   Flonase [fluticasone Propionate] Other (See Comments)   headache   Prevnar 13 [pneumococcal 13-val Conj Vacc] Other (See Comments)   Nerve damage in left arm   Beta Adrenergic Blockers    Potassium-containing Compounds    Rash and raw areas / sore breast    Alendronate Sodium Other (See Comments)   Ciprofloxacin Other (See Comments)   Patient denies allergy--"I take Cipro for bladder infections"   Codeine Other (See Comments)   Crestor [rosuvastatin Calcium] Other (See Comments)   Darvon Other (See Comments)   Lipitor [atorvastatin Calcium] Other (See Comments)   Pravachol Other (See Comments)   Propranolol Hcl Other (See Comments)   Unknown   Sulfa Antibiotics Other (See Comments)   Sulfamethoxazole Other (See Comments)   Unknown   Ultram [tramadol Hcl] Itching   Welchol [colesevelam Hcl] Other (See Comments)   Zetia [ezetimibe] Other (See Comments)   Zocor [simvastatin] Other (See Comments)      Medication List       Accurate as of  March 21, 2019 10:38 AM. Always use your most recent med list.        amLODipine 5 MG tablet Commonly known as:  NORVASC TAKE 1 TABLET (5 MG TOTAL) DAILY BY MOUTH.   aspirin EC 81 MG tablet Take 81 mg by mouth daily.   famotidine 20 MG tablet Commonly known as:  Pepcid Take 1 tablet (20 mg total) by mouth 2 (two) times daily.   fexofenadine 180 MG tablet Commonly known as:  ALLEGRA Take 180 mg by mouth daily.   hydrochlorothiazide 25 MG tablet Commonly known as:  HYDRODIURIL Take 1 tablet (25 mg total) by mouth daily.   levothyroxine 50 MCG tablet Commonly known as:  SYNTHROID Take 1 tablet (50 mcg total) by mouth daily before breakfast.   LORazepam 0.5 MG tablet  Commonly known as:  ATIVAN Take 1 tablet (0.5 mg total) by mouth 2 (two) times daily as needed.   meclizine 12.5 MG tablet Commonly known as:  ANTIVERT Take 1 tablet (12.5 mg total) by mouth 3 (three) times daily as needed for dizziness.   predniSONE 10 MG tablet Commonly known as:  DELTASONE Take 1 tab QID x 2 days, then 1 tab TID x 2 days, then 1 tab BID x 2 days, then 1 tab QD x 2 days.   PRESERVISION AREDS 2 PO Take 1 capsule by mouth at bedtime.   VITAMIN B-12 PO Take 600 mcg by mouth every morning.   Vitamin D 50 MCG (2000 UT) Caps Take 2,000 Units by mouth daily.          Objective:    BP (!) 179/106   Pulse 74   Temp (!) 97.3 F (36.3 C) (Oral)   Ht 5\' 4"  (1.626 m)   Wt 171 lb (77.6 kg)   LMP 03/21/1971   BMI 29.35 kg/m   Allergies  Allergen Reactions  . Nitrofurantoin Other (See Comments)    Liver failure  . Nsaids Other (See Comments)    Kidney failure  . Other Other (See Comments)    Most antibiotics require supervision per patient  . Flonase [Fluticasone Propionate] Other (See Comments)    headache  . Prevnar 13 [Pneumococcal 13-Val Conj Vacc] Other (See Comments)    Nerve damage in left arm  . Beta Adrenergic Blockers   . Potassium-Containing Compounds     Rash and raw areas / sore breast   . Alendronate Sodium Other (See Comments)  . Ciprofloxacin Other (See Comments)    Patient denies allergy--"I take Cipro for bladder infections"  . Codeine Other (See Comments)  . Crestor [Rosuvastatin Calcium] Other (See Comments)  . Darvon Other (See Comments)  . Lipitor [Atorvastatin Calcium] Other (See Comments)  . Pravachol Other (See Comments)  . Propranolol Hcl Other (See Comments)    Unknown  . Sulfa Antibiotics Other (See Comments)  . Sulfamethoxazole Other (See Comments)    Unknown  . Ultram [Tramadol Hcl] Itching  . Welchol [Colesevelam Hcl] Other (See Comments)  . Zetia [Ezetimibe] Other (See Comments)  . Zocor [Simvastatin] Other (See  Comments)    Wt Readings from Last 3 Encounters:  03/21/19 171 lb (77.6 kg)  12/06/18 172 lb (78 kg)  08/23/18 172 lb (78 kg)    Physical Exam Constitutional:      Appearance: She is well-developed.  HENT:     Head: Normocephalic and atraumatic.  Cardiovascular:     Rate and Rhythm: Normal rate and regular rhythm.     Heart sounds: Normal  heart sounds.  Pulmonary:     Effort: Pulmonary effort is normal.  Musculoskeletal:     Left knee: She exhibits swelling. She exhibits no erythema. Tenderness found. Medial joint line tenderness noted.       Legs:  Skin:    General: Skin is warm and dry.     Findings: No rash.  Neurological:     Mental Status: She is alert and oriented to person, place, and time.     Deep Tendon Reflexes: Reflexes are normal and symmetric.  Psychiatric:        Behavior: Behavior normal.         Assessment & Plan:   1. Arthritis of knee, left Joint injection performed - Ambulatory referral to Orthopedic Surgery - methylPREDNISolone acetate (DEPO-MEDROL) injection 80 mg  JOINT INJECTION: Dr. Lajuana Ripple is present for this procedure.  Patient denies allergy to antiseptics (including iodine) and anesthetics: Yes         .   Patient without h/o diabetes, frequent steroid use,  Does use aspirin 81 mg  Patient was given informed consent and a signed copy has been placed in the chart. Appropriate time out was taken. Area prepped and draped in usual sterile fashion. Anatomic landmarks were identified and injection site was marked.  Ethyl chloride spray was used to numb the area and 1 cc of methylprednisolone 40 mg/ml plus  3 cc of 1% lidocaine without epinephrine was injected into the knee using a(n) anterolateral approach. The patient tolerated the procedure well and there were no immediate complications. Estimated blood loss is less than 5 cc.  Post procedure instructions were reviewed and handout outlining these instructions were provided to patient.   Continue all other maintenance medications as listed above.  Follow up plan: Referred to orthopedics  Educational handout given for joint injection Call office if there are any problems.  Terald Sleeper PA-C [facility] [address]  [place], [place] [address] [phone]   03/21/2019, 10:38 AM

## 2019-03-21 NOTE — Patient Instructions (Signed)
Knee Injection A knee injection is a procedure to get medicine into your knee joint to relieve the pain, swelling, and stiffness of arthritis. Your health care provider uses a needle to inject medicine, which may also help to lubricate and cushion your knee joint. You may need more than one injection. Tell a health care provider about:  Any allergies you have.  All medicines you are taking, including vitamins, herbs, eye drops, creams, and over-the-counter medicines.  Any problems you or family members have had with anesthetic medicines.  Any blood disorders you have.  Any surgeries you have had.  Any medical conditions you have.  Whether you are pregnant or may be pregnant. What are the risks? Generally, this is a safe procedure. However, problems may occur, including:  Infection.  Bleeding.  Symptoms that get worse.  Damage to the area around your knee.  Allergic reaction to any of the medicines.  Skin reactions from repeated injections. What happens before the procedure?  Ask your health care provider about changing or stopping your regular medicines. This is especially important if you are taking diabetes medicines or blood thinners.  Plan to have someone take you home from the hospital or clinic. What happens during the procedure?   You will sit or lie down in a position for your knee to be treated.  The skin over your kneecap will be cleaned with a germ-killing soap.  You will be given a medicine that numbs the area (local anesthetic). You may feel some stinging.  The medicine will be injected into your knee. The needle is carefully placed between your kneecap and your knee. The medicine is injected into the joint space.  The needle will be removed at the end of the procedure.  A bandage (dressing) may be placed over the injection site. The procedure may vary among health care providers and hospitals. What can I expect after the procedure?  Your blood  pressure, heart rate, breathing rate, and blood oxygen level will be monitored until you leave the hospital or clinic.  You may have to move your knee through its full range of motion. This helps to get all the medicine into your joint space.  You will be watched to make sure that you do not have a reaction to the injected medicine.  You may feel more pain, swelling, and warmth than you did before the injection. This reaction may last about 1-2 days. Follow these instructions at home: Medicines  Take over-the-counter and prescription medicines only as told by your doctor.  Do not drive or use heavy machinery while taking prescription pain medicine.  Do not take medicines such as aspirin and ibuprofen unless your health care provider tells you to take them. Injection site care  Follow instructions from your health care provider about: ? How to take care of your puncture site. ? When and how you should change your dressing. ? When you should remove your dressing.  Check your injection area every day for signs of infection. Check for: ? More redness, swelling, or pain after 2 days. ? Fluid or blood. ? Pus or a bad smell. ? Warmth. Managing pain, stiffness, and swelling   If directed, put ice on the injection area: ? Put ice in a plastic bag. ? Place a towel between your skin and the bag. ? Leave the ice on for 20 minutes, 2-3 times per day.  Do not apply heat to your knee.  Raise (elevate) the injection area above the level   apply heat to your knee.  Raise (elevate) the injection area above the level of your heart while you are sitting or lying down.  General instructions  If you were given a dressing, keep it dry until your health care provider says it can be removed. Ask your health care provider when you can start showering or taking a bath.  Avoid strenuous activities for as long as directed by your health care provider. Ask your health care provider when you can return to your normal activities.  Keep all follow-up visits as told by your health care provider. This is important. You may need more injections.  Contact a health care provider if  you have:  A fever.  Warmth in your injection area.  Fluid, blood, or pus coming from your injection site.  Symptoms at your injection site that last longer than 2 days after your procedure.  Get help right away if:  Your knee:  Turns very red.  Becomes very swollen.  Is in severe pain.  Summary  A knee injection is a procedure to get medicine into your knee joint to relieve the pain, swelling, and stiffness of arthritis.  A needle is carefully placed between your kneecap and your knee to inject medicine into the joint space.  Before the procedure, ask your health care provider about changing or stopping your regular medicines, especially if you are taking diabetes medicines or blood thinners.  Contact your health care provider if you have any problems or questions after your procedure.  This information is not intended to replace advice given to you by your health care provider. Make sure you discuss any questions you have with your health care provider.  Document Released: 02/04/2007 Document Revised: 12/03/2017 Document Reviewed: 12/03/2017  Elsevier Interactive Patient Education  2019 [employer]

## 2019-03-21 NOTE — Telephone Encounter (Signed)
appt scheduled Pt notified 

## 2019-03-28 ENCOUNTER — Telehealth: Payer: Self-pay | Admitting: Orthopedic Surgery

## 2019-03-28 NOTE — Telephone Encounter (Signed)
2 WEEKS

## 2019-03-28 NOTE — Telephone Encounter (Addendum)
Patient called to see about her referral from [place]. She was seen there on 4/24 and was given an joint injection in her left knee. They were referring her to you for L Knee Arthritis, patient is wanting to be scheduled as soon as possible. She needs a Friday appointment.   Please review and advise

## 2019-04-11 ENCOUNTER — Ambulatory Visit: Payer: Medicare Other | Admitting: Orthopedic Surgery

## 2019-04-11 ENCOUNTER — Encounter: Payer: Self-pay | Admitting: Orthopedic Surgery

## 2019-04-11 ENCOUNTER — Other Ambulatory Visit: Payer: Self-pay

## 2019-04-11 VITALS — BP 135/100 | HR 103 | Temp 97.7°F | Ht 64.0 in | Wt 170.0 lb

## 2019-04-11 DIAGNOSIS — M7052 Other bursitis of knee, left knee: Secondary | ICD-10-CM

## 2019-04-11 NOTE — Progress Notes (Addendum)
NEW PROBLEM OFFICE VISIT  Chief Complaint  Patient presents with  . Knee Pain    left     85 year old female presents for evaluation of left knee pain  She has had pain left knee medial side 2 years received 2 injections and took a prednisone pack.  She says usually the injections help (Dr.'s notes indicate knee joint injection) but this time they did not and the prednisone pack did not help either and she asked for further evaluation  The pain is constant it is over the medial side of the posterior medial and medial joint line does not affect her walking but she does notice swelling over the Pez bursa   Review of Systems  Constitutional: Negative for fever.  Musculoskeletal: Positive for back pain and falls.       History of tailbone fracture bilateral SI joint pain  History of 2 fractures right hip fixed uses a cane occasional limp  Neurological: Negative for tingling, sensory change and focal weakness.     Past Medical History:  Diagnosis Date  . Adjustment disorder with mixed anxiety and depressed mood   . Atrial fibrillation ([place]) 11/04/2015   Post-operative  . Benign hypertensive heart disease without heart failure   . CAD (coronary artery disease)   . Infection of urinary tract 10/31/2015  . Interstitial cystitis   . Osteopenia   . Other and unspecified hyperlipidemia   . Other dyspnea and respiratory abnormality   . Pelvic fracture ([place]) 05/30/2015  . 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 ABDOMINAL HYSTERECTOMY     Cancer and endometriosis  . CARDIAC CATHETERIZATION  2008  . CARDIAC CATHETERIZATION N/A 10/29/2015   Procedure: Left Heart Cath and Coronary Angiography;  Surgeon: Belva Crome, MD;  Location: [facility];  Service: Cardiovascular;  Laterality: N/A;  . CATARACT EXTRACTION    . CHOLECYSTECTOMY    . CORONARY ARTERY BYPASS GRAFT N/A 11/02/2015   Procedure: OFF PUMP CORONARY ARTERY  BYPASS GRAFTING (CABG) TIMES ONE;  Surgeon: Rexene Alberts, MD;  Location: [facility];  Service: Open Heart Surgery;  Laterality: N/A;  LIMA to LAD  . FEMUR FRACTURE SURGERY  09/2015   right hip compression screws   . FRACTURE SURGERY     Right hip  . HERNIA REPAIR     Right inguinal  . TEE WITHOUT CARDIOVERSION N/A 11/02/2015   Procedure: TRANSESOPHAGEAL ECHOCARDIOGRAM (TEE);  Surgeon: Rexene Alberts, MD;  Location: [facility];  Service: Open Heart Surgery;  Laterality: N/A;    Family History  Problem Relation Age of Onset  . CAD Mother 43       Died suddenly  . CAD Father 18       Died suddenly  . CAD Daughter 97  . Diabetes type I Daughter        Since age 10  . Heart disease Sister   . Osteoporosis Sister    Social History   Tobacco Use  . Smoking status: Never Smoker  . Smokeless tobacco: Never Used  Substance Use Topics  . Alcohol use: No  . Drug use: No    Allergies  Allergen Reactions  . Nitrofurantoin Other (See Comments)    Liver failure  . Nsaids Other (See Comments)    Kidney failure  . Other Other (See Comments)    Most antibiotics require supervision per patient  . Flonase [Fluticasone Propionate]  Other (See Comments)    headache  . Prevnar 13 [Pneumococcal 13-Val Conj Vacc] Other (See Comments)    Nerve damage in left arm  . Beta Adrenergic Blockers   . Potassium-Containing Compounds     Rash and raw areas / sore breast   . Alendronate Sodium Other (See Comments)  . Ciprofloxacin Other (See Comments)    Patient denies allergy--"I take Cipro for bladder infections"  . Codeine Other (See Comments)  . Crestor [Rosuvastatin Calcium] Other (See Comments)  . Darvon Other (See Comments)  . Lipitor [Atorvastatin Calcium] Other (See Comments)  . Pravachol Other (See Comments)  . Propranolol Hcl Other (See Comments)    Unknown  . Sulfa Antibiotics Other (See Comments)  . Sulfamethoxazole Other (See Comments)    Unknown  . Ultram [Tramadol Hcl] Itching  .  Welchol [Colesevelam Hcl] Other (See Comments)  . Zetia [Ezetimibe] Other (See Comments)  . Zocor [Simvastatin] Other (See Comments)    Current Meds  Medication Sig  . amLODipine (NORVASC) 5 MG tablet TAKE 1 TABLET (5 MG TOTAL) DAILY BY MOUTH.  Marland Kitchen aspirin EC 81 MG tablet Take 81 mg by mouth daily.  . Cholecalciferol (VITAMIN D) 2000 UNITS CAPS Take 2,000 Units by mouth daily.   . Cyanocobalamin (VITAMIN B-12 PO) Take 600 mcg by mouth every morning.   . famotidine (PEPCID) 20 MG tablet Take 1 tablet (20 mg total) by mouth 2 (two) times daily.  . fexofenadine (ALLEGRA) 180 MG tablet Take 180 mg by mouth daily.  . hydrochlorothiazide (HYDRODIURIL) 25 MG tablet Take 1 tablet (25 mg total) by mouth daily.  Marland Kitchen levothyroxine (SYNTHROID, LEVOTHROID) 50 MCG tablet Take 1 tablet (50 mcg total) by mouth daily before breakfast.  . LORazepam (ATIVAN) 0.5 MG tablet Take 1 tablet (0.5 mg total) by mouth 2 (two) times daily as needed.  . Multiple Vitamins-Minerals (PRESERVISION AREDS 2 PO) Take 1 capsule by mouth at bedtime.    BP (!) 135/100   Pulse (!) 103   Temp 97.7 F (36.5 C)   Ht 5\' 4"  (1.626 m)   Wt 170 lb (77.1 kg)   LMP 03/21/1971   BMI 29.18 kg/m   Physical Exam Vitals signs and nursing note reviewed.  Constitutional:      Appearance: Normal appearance.  Neurological:     Mental Status: She is alert and oriented to person, place, and time.     Gait: Gait abnormal.     Comments: She uses a cane in the right hand but no noticeable left leg gait problem.  Psychiatric:        Mood and Affect: Mood normal.     Ortho Exam Right hip joint range of motion is normal moves freely and easily with no restrictions pain or inhibition.  Full range of motion no tenderness excellent stability good strength skin is normal pulses are good sensation is great she has some mild varicosities  Left knee swelling and tenderness over the Pez bursa tenderness over the medial joint line still has excellent  range of motion and stability in the left knee good strength is noted skin is normal pulses are good mild varicosities no edema normal sensation  Right knee no swelling over the Pez bursa good range of motion stability and strength skin normal   MEDICAL DECISION SECTION  Xrays were done at Outside facility  My independent reading of xrays:  Osteoarthritis osteopenia  Report review CLINICAL DATA:  Progressive left knee pain and swelling.   EXAM:  LEFT KNEE - COMPLETE 4+ VIEW   COMPARISON:  Radiographs dated 03/13/2018   FINDINGS: No evidence of fracture, dislocation, or joint effusion. There is medial joint space narrowing with small marginal osteophytes in the patellofemoral and lateral compartments.   IMPRESSION: No acute bone abnormality. No joint effusion. Slight arthritic changes, stable.     Electronically Signed   By: Lorriane Shire M.D.   On: 03/25/2018 12:04     Encounter Diagnosis  Name Primary?  . Pes anserinus bursitis of left knee Yes    PLAN: (Rx., injectx, surgery, frx, mri/ct) Inject Pez bursa  Procedure note Left knee injection for bursitis  verbal consent was obtained to inject Left knee PES BURSA  Timeout was completed to confirm the site of injection  The medications used were 40 mg of Depo-Medrol and 1% lidocaine 3 cc  Anesthesia was provided by ethyl chloride and the skin was prepped with alcohol.  After cleaning the skin with alcohol a 25-gauge needle was used to inject the left knee bursa   There were no complications and a sterile bandage was applied   Apply ice and Aspercreme 3 times a day   No improvement follow-up for repeat injection patient aware to call for injection if no improvement  No orders of the defined types were placed in this encounter.   Arther Abbott, MD  04/11/2019 10:16 AM

## 2019-04-11 NOTE — Patient Instructions (Addendum)
Ice 3 x a day   aspercreme 3 x a day    Pes Anserine Bursitis  The pes anserine is an area on the inside of your knee, just below the joint, that is cushioned by a fluid-filled sac (bursa). Pes anserine bursitis is a condition that happens when the bursa gets swollen and irritated. The condition causes knee pain. What are the causes? This condition may be caused by:  Making the same movement over and over.  A direct hit (trauma) to the inside of the leg. What increases the risk? You are more likely to develop this condition if you:  Are a runner.  Play sports that involve a lot of running and quick side-to-side movements (cutting).  Are an athlete who plays contact sports.  Swim using an inward angle of the knee, such as with the breaststroke.  Have tight hamstring muscles.  Are a woman.  Are overweight.  Have flat feet.  Have diabetes or osteoarthritis. What are the signs or symptoms? Symptoms of this condition include:  Knee pain that gets better with rest and worse with activities like climbing stairs, walking, running, or getting in and out of a chair.  Swelling.  Warmth.  Tenderness when pressing at the inside of the lower leg, just below the knee. How is this diagnosed? This condition may be diagnosed based on:  Your symptoms.  Your medical history.  A physical exam. ? During your physical exam, your health care provider will press on the tendon attachment to see if you feel pain. ? Your health care provider may also check your hip and knee motion and strength.  Tests to check for swelling and fluid buildup in the bursa and to look at muscles, bones, and tendons. These tests might include: ? X-rays. ? MRI. ? Ultrasound. How is this treated? This condition may be treated by:  Resting your knee. You may be told to raise (elevate) your knee while resting.  Avoiding activities that cause pain.  Icing the inside of your knee.  Sleeping with a  pillow between your knees. This will cushion your injured knee.  Taking medicine by mouth (orally) to reduce pain and swelling or having medicine injected into your knee.  Doing strengthening and stretching exercises (physical therapy). If these treatments do not work or if the condition keeps coming back, you may need to have surgery to remove the bursa. Follow these instructions at home: Managing pain, stiffness, and swelling   If directed, put ice on the injured area. ? Put ice in a plastic bag. ? Place a towel between your skin and the bag. ? Leave the ice on for 20 minutes, 2-3 times a day.  Elevate the injured area above the level of your heart while you are sitting or lying down. Activity  Return to your normal activities as told by your health care provider. Ask your health care provider what activities are safe for you.  Do exercises as told by your health care provider. General instructions  Take over-the-counter and prescription medicines only as told by your health care provider.  Sleep with a pillow between your knees.  Do not use any products that contain nicotine or tobacco, such as cigarettes, e-cigarettes, and chewing tobacco. These can delay healing. If you need help quitting, ask your health care provider.  If you are overweight, work with your health care provider and a dietitian to set a weight-loss goal that is healthy and reasonable for you.  Keep all follow-up  visits as told by your health care provider. This is important. How is this prevented?  When exercising, make sure that you: ? Warm up and stretch before being active. ? Cool down and stretch after being active. ? Give your body time to rest between periods of activity. ? Use equipment that fits you. ? Are safe and responsible while being active to avoid falls. ? Do at least 150 minutes of moderate-intensity exercise each week, such as brisk walking or water aerobics. ? Maintain physical fitness,  including:  Strength.  Flexibility.  Cardiovascular fitness.  Endurance. ? Maintain a healthy weight. Contact a health care provider if:  Your symptoms do not improve.  Your symptoms get worse. Summary  Pes anserine bursitis is a condition that happens when the fluid-filled sac (bursa) at the inside of your knee gets swollen and irritated. The condition causes knee pain.  Treatment for pes anserine bursitis may include resting your knee, icing the inside of your knee, sleeping with a pillow between your knees, taking medicine by mouth or by injection, and doing strengthening and stretching exercises (physical therapy).  Follow instructions for managing pain, stiffness, and swelling.  Take over-the-counter and prescription medicines only as told by your health care provider. This information is not intended to replace advice given to you by your health care provider. Make sure you discuss any questions you have with your health care provider. Document Released: 11/13/2005 Document Revised: 04/24/2018 Document Reviewed: 04/24/2018 Elsevier Interactive Patient Education  2019 [employer].

## 2019-04-15 ENCOUNTER — Other Ambulatory Visit: Payer: Self-pay

## 2019-04-15 ENCOUNTER — Encounter: Payer: Self-pay | Admitting: Family Medicine

## 2019-04-15 ENCOUNTER — Ambulatory Visit (INDEPENDENT_AMBULATORY_CARE_PROVIDER_SITE_OTHER): Payer: Medicare Other | Admitting: Family Medicine

## 2019-04-15 ENCOUNTER — Telehealth: Payer: Self-pay | Admitting: Family Medicine

## 2019-04-15 DIAGNOSIS — K219 Gastro-esophageal reflux disease without esophagitis: Secondary | ICD-10-CM

## 2019-04-15 DIAGNOSIS — I2510 Atherosclerotic heart disease of native coronary artery without angina pectoris: Secondary | ICD-10-CM

## 2019-04-15 DIAGNOSIS — I10 Essential (primary) hypertension: Secondary | ICD-10-CM | POA: Diagnosis not present

## 2019-04-15 DIAGNOSIS — N390 Urinary tract infection, site not specified: Secondary | ICD-10-CM | POA: Diagnosis not present

## 2019-04-15 DIAGNOSIS — R5383 Other fatigue: Secondary | ICD-10-CM

## 2019-04-15 DIAGNOSIS — E559 Vitamin D deficiency, unspecified: Secondary | ICD-10-CM

## 2019-04-15 DIAGNOSIS — I480 Paroxysmal atrial fibrillation: Secondary | ICD-10-CM

## 2019-04-15 DIAGNOSIS — I700 Atherosclerosis of aorta: Secondary | ICD-10-CM

## 2019-04-15 DIAGNOSIS — M1712 Unilateral primary osteoarthritis, left knee: Secondary | ICD-10-CM | POA: Diagnosis not present

## 2019-04-15 DIAGNOSIS — R21 Rash and other nonspecific skin eruption: Secondary | ICD-10-CM

## 2019-04-15 DIAGNOSIS — R531 Weakness: Secondary | ICD-10-CM

## 2019-04-15 DIAGNOSIS — Z889 Allergy status to unspecified drugs, medicaments and biological substances status: Secondary | ICD-10-CM

## 2019-04-15 DIAGNOSIS — F4323 Adjustment disorder with mixed anxiety and depressed mood: Secondary | ICD-10-CM

## 2019-04-15 DIAGNOSIS — E7800 Pure hypercholesterolemia, unspecified: Secondary | ICD-10-CM

## 2019-04-15 MED ORDER — LORAZEPAM 0.5 MG PO TABS: 0.5000 mg | ORAL_TABLET | Freq: Two times a day (BID) | ORAL | 1 refills | Status: DC | PRN

## 2019-04-15 NOTE — Patient Instructions (Signed)
Continue to practice good hand and respiratory hygiene Continue to be careful and not put self at risk for falling Follow-up with cardiology and with orthopedics as needed Drink plenty of water and fluids and stay well-hydrated Continue current medication Follow-up with urology as needed

## 2019-04-15 NOTE — Telephone Encounter (Signed)
Pt aware of labs , etc..

## 2019-04-15 NOTE — Addendum Note (Signed)
Addended by: Zannie Cove on: 04/15/2019 01:28 PM   Modules accepted: Orders

## 2019-04-15 NOTE — Progress Notes (Signed)
Virtual Visit Via telephone Note I connected with@ on 04/15/19 by telephone and verified that I am speaking with the correct person or authorized healthcare agent using two identifiers. Emily Wagner is currently located at home and there are no unauthorized people in close proximity. I completed this visit while in a private location in my home .  This visit type was conducted due to national recommendations for restrictions regarding the COVID-19 Pandemic (e.g. social distancing).  This format is felt to be most appropriate for this patient at this time.  All issues noted in this document were discussed and addressed.  No physical exam was performed.    I discussed the limitations, risks, security and privacy concerns of performing an evaluation and management service by telephone and the availability of in person appointments. I also discussed with the patient that there may be a patient responsible charge related to this service. The patient expressed understanding and agreed to proceed.   Date:  04/15/2019    ID:  Emily Wagner      09/23/1933        [id number]   Patient Care Team Patient Care Team: Chipper Herb, MD as PCP - General (Family Medicine) Irine Seal, MD (Urology) Minus Breeding, MD (Cardiology) Steffanie Rainwater, DPM as Consulting Physician (Podiatry)  Reason for Visit: Primary Care Follow-up     History of Present Illness & Review of Systems:     Emily Wagner is a 85 y.o. year old female primary care patient that presents today for a telehealth visit.  I have known this patient for many years.  She has a history of coronary artery disease atrial fibrillation and history of CABG x1.  Her other significant history is that of multiple drug allergies.  She has history of chronic urinary tract infections.  She is also had a pelvic fracture and a lot of arthritis symptoms.  She has to avoid NSAIDs because of chronic kidney disease.  The patient today complains  of fatigue.  She is not been getting out of the house a lot.  She is still concerned about rashes that come and go on her palms and feet.  She is still concerned about her exposure to a tick bite.  She has been seeing Dr. Archie Patten regularly and her blood pressure has been up and down.  She tried amlodipine but this made her very sleepy.  He told her to take the amlodipine only when the blood pressure is elevated x1 and that would be half of a 5 mg on an as needed basis.  She did see Dr. Aline Brochure, the orthopedist and he did an injection into her left knee which he thought she had bursitis and this is helped that significantly.  She was impressed with his care and was reassured to come back if she needed more injections.  Her right hip is doing well.  She denies any chest pain or shortness of breath anymore than usual.  She is due to see Dr. Percival Spanish again in September.  She denies any trouble with swallowing heartburn indigestion nausea vomiting diarrhea or blood in the stool.  The medicine that she takes for her stomach, the famotidine is helping her heartburn.  She has had interstitial cystitis and sees Dr. Jeffie Pollock periodically for this.  Currently she is doing well with this.  Review of systems as stated, otherwise negative.  The patient does not have symptoms concerning for COVID-19 infection (fever,  chills, cough, or new shortness of breath).      Current Medications (Verified) Allergies as of 04/15/2019      Reactions   Nitrofurantoin Other (See Comments)   Liver failure   Nsaids Other (See Comments)   Kidney failure   Other Other (See Comments)   Most antibiotics require supervision per patient   Flonase [fluticasone Propionate] Other (See Comments)   headache   Prevnar 13 [pneumococcal 13-val Conj Vacc] Other (See Comments)   Nerve damage in left arm   Beta Adrenergic Blockers    Potassium-containing Compounds    Rash and raw areas / sore breast    Alendronate Sodium Other (See Comments)    Ciprofloxacin Other (See Comments)   Patient denies allergy--"I take Cipro for bladder infections"   Codeine Other (See Comments)   Crestor [rosuvastatin Calcium] Other (See Comments)   Darvon Other (See Comments)   Lipitor [atorvastatin Calcium] Other (See Comments)   Pravachol Other (See Comments)   Propranolol Hcl Other (See Comments)   Unknown   Sulfa Antibiotics Other (See Comments)   Sulfamethoxazole Other (See Comments)   Unknown   Ultram [tramadol Hcl] Itching   Welchol [colesevelam Hcl] Other (See Comments)   Zetia [ezetimibe] Other (See Comments)   Zocor [simvastatin] Other (See Comments)      Medication List       Accurate as of Apr 15, 2019  9:44 AM. If you have any questions, ask your nurse or doctor.        amLODipine 5 MG tablet Commonly known as:  NORVASC TAKE 1 TABLET (5 MG TOTAL) DAILY BY MOUTH.   aspirin EC 81 MG tablet Take 81 mg by mouth daily.   famotidine 20 MG tablet Commonly known as:  Pepcid Take 1 tablet (20 mg total) by mouth 2 (two) times daily.   fexofenadine 180 MG tablet Commonly known as:  ALLEGRA Take 180 mg by mouth daily.   hydrochlorothiazide 25 MG tablet Commonly known as:  HYDRODIURIL Take 1 tablet (25 mg total) by mouth daily.   levothyroxine 50 MCG tablet Commonly known as:  SYNTHROID Take 1 tablet (50 mcg total) by mouth daily before breakfast.   LORazepam 0.5 MG tablet Commonly known as:  ATIVAN Take 1 tablet (0.5 mg total) by mouth 2 (two) times daily as needed.   meclizine 12.5 MG tablet Commonly known as:  ANTIVERT Take 1 tablet (12.5 mg total) by mouth 3 (three) times daily as needed for dizziness.   PRESERVISION AREDS 2 PO Take 1 capsule by mouth at bedtime.   VITAMIN B-12 PO Take 600 mcg by mouth every morning.   Vitamin D 50 MCG (2000 UT) Caps Take 2,000 Units by mouth daily.           Allergies (Verified)    Nitrofurantoin; Nsaids; Other; Flonase [fluticasone propionate]; Prevnar 13 [pneumococcal  13-val conj vacc]; Beta adrenergic blockers; Potassium-containing compounds; Alendronate sodium; Ciprofloxacin; Codeine; Crestor [rosuvastatin calcium]; Darvon; Lipitor [atorvastatin calcium]; Pravachol; Propranolol hcl; Sulfa antibiotics; Sulfamethoxazole; Ultram [tramadol hcl]; Welchol [colesevelam hcl]; Zetia [ezetimibe]; and Zocor [simvastatin]  Past Medical History Past Medical History:  Diagnosis Date  . Adjustment disorder with mixed anxiety and depressed mood   . Atrial fibrillation ([place]) 11/04/2015   Post-operative  . Benign hypertensive heart disease without heart failure   . CAD (coronary artery disease)   . Infection of urinary tract 10/31/2015  . Interstitial cystitis   . Osteopenia   . Other and unspecified hyperlipidemia   . Other dyspnea  and respiratory abnormality   . Pelvic fracture ([place]) 05/30/2015  . 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   LIMA to LAD     Past Surgical History:  Procedure Laterality Date  . ABDOMINAL HYSTERECTOMY     Cancer and endometriosis  . CARDIAC CATHETERIZATION  2008  . CARDIAC CATHETERIZATION N/A 10/29/2015   Procedure: Left Heart Cath and Coronary Angiography;  Surgeon: Belva Crome, MD;  Location: [facility];  Service: Cardiovascular;  Laterality: N/A;  . CATARACT EXTRACTION    . CHOLECYSTECTOMY    . CORONARY ARTERY BYPASS GRAFT N/A 11/02/2015   Procedure: OFF PUMP CORONARY ARTERY BYPASS GRAFTING (CABG) TIMES ONE;  Surgeon: Rexene Alberts, MD;  Location: [facility];  Service: Open Heart Surgery;  Laterality: N/A;  LIMA to LAD  . FEMUR FRACTURE SURGERY  09/2015   right hip compression screws   . FRACTURE SURGERY     Right hip  . HERNIA REPAIR     Right inguinal  . TEE WITHOUT CARDIOVERSION N/A 11/02/2015   Procedure: TRANSESOPHAGEAL ECHOCARDIOGRAM (TEE);  Surgeon: Rexene Alberts, MD;  Location: [facility];  Service: Open Heart Surgery;  Laterality: N/A;    Social History   Socioeconomic History  . Marital status: Widowed    Spouse name:  Not on file  . Number of children: 2  . Years of education: Not on file  . Highest education level: Not on file  Occupational History  . Occupation: retired    Fish farm manager: [employer]    Comment: admin. assistant  Social Needs  . Financial resource strain: Not on file  . Food insecurity:    Worry: Not on file    Inability: Not on file  . Transportation needs:    Medical: Not on file    Non-medical: Not on file  Tobacco Use  . Smoking status: Never Smoker  . Smokeless tobacco: Never Used  Substance and Sexual Activity  . Alcohol use: No  . Drug use: No  . Sexual activity: Not on file  Lifestyle  . Physical activity:    Days per week: Not on file    Minutes per session: Not on file  . Stress: Not on file  Relationships  . Social connections:    Talks on phone: Not on file    Gets together: Not on file    Attends religious service: Not on file    Active member of club or organization: Not on file    Attends meetings of clubs or organizations: Not on file    Relationship status: Not on file  Other Topics Concern  . Not on file  Social History Narrative   Lived with daughter who has had a massive MI and stroke. - passes away     Family History  Problem Relation Age of Onset  . CAD Mother 32       Died suddenly  . CAD Father 44       Died suddenly  . CAD Daughter 28  . Diabetes type I Daughter        Since age 58  . Heart disease Sister   . Osteoporosis Sister       Labs/Other Tests and Data Reviewed:    Wt Readings from Last 3 Encounters:  04/11/19 170 lb (77.1 kg)  03/21/19 171 lb (77.6 kg)  12/06/18 172 lb (78 kg)   Temp Readings from Last 3 Encounters:  04/11/19 97.7 F (36.5 C)  03/21/19 (!) 97.3 F (36.3  C) (Oral)  12/06/18 (!) 97.5 F (36.4 C) (Oral)   BP Readings from Last 3 Encounters:  04/11/19 (!) 135/100  03/21/19 (!) 179/106  12/06/18 131/76   Pulse Readings from Last 3 Encounters:  04/11/19 (!) 103  03/21/19 74  12/06/18 85     Lab  Results  Component Value Date   HGBA1C 5.2 10/31/2015   Lab Results  Component Value Date   LDLCALC 142 (H) 07/03/2018   CREATININE 1.19 (H) 10/23/2018       Chemistry      Component Value Date/Time   NA 140 10/23/2018 1125   K 3.5 10/23/2018 1125   CL 100 10/23/2018 1125   CO2 22 10/23/2018 1125   BUN 19 10/23/2018 1125   CREATININE 1.19 (H) 10/23/2018 1125   CREATININE 0.83 03/20/2013 0958      Component Value Date/Time   CALCIUM 9.3 10/23/2018 1125   ALKPHOS 80 07/03/2018 1303   AST 19 07/03/2018 1303   ALT 13 07/03/2018 1303   BILITOT 0.5 07/03/2018 1303         OBSERVATIONS/ OBJECTIVE:     The patient was calm and alert and not anxious over the phone.  She responded to questions asked of her appropriately.  She does need a refill on her Lorazepam.  She gets this from [facility].  Her blood pressure generally run in the 130s over the 80s.  Her weight is about 170 now.  Physical exam deferred due to nature of telephonic visit.  ASSESSMENT & PLAN    Time:   Today, I have spent 24 minutes with the patient via telephone discussing the above including Covid precautions.     Visit Diagnoses: 1. Arthritis of knee, left -This is better after seeing Dr. Aline Brochure in [place] and having an injection into the left knee.  2. Vitamin D deficiency -Continue with vitamin D replacement pending results of lab work  3. Gastroesophageal reflux disease, esophagitis presence not specified -This is stable and she will continue with the famotidine.  4. Essential hypertension -Blood pressure has been stable and she only takes amlodipine one half of a 5 mg tablet when her blood pressure goes up x1.  She does not take this regularly.  She will continue with her regular antihypertensive medicines.  5. Recurrent urinary tract infection -The patient is followed by Dr. Jeffie Pollock, her urologist.  She has a long history of interstitial cystitis.  She has been doing better with this recently  and has not had to make any visits to see him.  She is drinking plenty of water.  6. HYPERCHOLESTEROLEMIA -Continue aggressive therapeutic lifestyle changes  7. ASCVD (arteriosclerotic cardiovascular disease) -Follow-up with cardiology as planned  8. Paroxysmal atrial fibrillation [facility]) -Follow-up with cardiology as planned in the next visit is due sometime the patient says in September.  9. Multiple drug allergies -This is an ongoing and problematic issue.  Anytime that prescriptions are written, someone should check her allergy list before prescribing.  10. Coronary artery disease involving native coronary artery, angina presence unspecified, unspecified whether native or transplanted heart -Continue aggressive therapeutic lifestyle changes and follow-up with cardiology as planned  11. Aortic atherosclerosis ([place]) -Continue aggressive therapeutic lifestyle changes  12. Adjustment disorder with mixed anxiety and depressed mood -Continue with lorazepam.  New prescription is needed.  13.  Fatigue -Check lab work in addition to check thyroid profile.  Meds ordered this encounter  Medications  . LORazepam (ATIVAN) 0.5 MG tablet    Sig:  Take 1 tablet (0.5 mg total) by mouth 2 (two) times daily as needed.    Dispense:  180 tablet    Refill:  1    Not to exceed 3 additional fills before 01/29/2019   Patient Instructions  Continue to practice good hand and respiratory hygiene Continue to be careful and not put self at risk for falling Follow-up with cardiology and with orthopedics as needed Drink plenty of water and fluids and stay well-hydrated Continue current medication Follow-up with urology as needed     The above assessment and management plan was discussed with the patient. The patient verbalized understanding of and has agreed to the management plan. Patient is aware to call the clinic if symptoms persist or worsen. Patient is aware when to return to the clinic for a  follow-up visit. Patient educated on when it is appropriate to go to the emergency department.    Chipper Herb, MD [address], [place], [place] [address] Ph [phone]   Arrie Senate MD

## 2019-04-17 ENCOUNTER — Other Ambulatory Visit: Payer: Self-pay

## 2019-04-17 ENCOUNTER — Other Ambulatory Visit: Payer: Medicare Other

## 2019-04-17 DIAGNOSIS — F4323 Adjustment disorder with mixed anxiety and depressed mood: Secondary | ICD-10-CM

## 2019-04-17 DIAGNOSIS — R531 Weakness: Secondary | ICD-10-CM | POA: Diagnosis not present

## 2019-04-17 DIAGNOSIS — K219 Gastro-esophageal reflux disease without esophagitis: Secondary | ICD-10-CM | POA: Diagnosis not present

## 2019-04-17 DIAGNOSIS — Z889 Allergy status to unspecified drugs, medicaments and biological substances status: Secondary | ICD-10-CM

## 2019-04-17 DIAGNOSIS — R5383 Other fatigue: Secondary | ICD-10-CM

## 2019-04-17 DIAGNOSIS — I700 Atherosclerosis of aorta: Secondary | ICD-10-CM

## 2019-04-17 DIAGNOSIS — I2510 Atherosclerotic heart disease of native coronary artery without angina pectoris: Secondary | ICD-10-CM

## 2019-04-17 DIAGNOSIS — R21 Rash and other nonspecific skin eruption: Secondary | ICD-10-CM

## 2019-04-17 DIAGNOSIS — M1712 Unilateral primary osteoarthritis, left knee: Secondary | ICD-10-CM

## 2019-04-17 DIAGNOSIS — I480 Paroxysmal atrial fibrillation: Secondary | ICD-10-CM

## 2019-04-17 DIAGNOSIS — E559 Vitamin D deficiency, unspecified: Secondary | ICD-10-CM

## 2019-04-17 DIAGNOSIS — I10 Essential (primary) hypertension: Secondary | ICD-10-CM | POA: Diagnosis not present

## 2019-04-17 DIAGNOSIS — N390 Urinary tract infection, site not specified: Secondary | ICD-10-CM

## 2019-04-17 DIAGNOSIS — E7800 Pure hypercholesterolemia, unspecified: Secondary | ICD-10-CM

## 2019-04-19 LAB — LYME, WESTERN BLOT, SERUM (REFLEXED)
IgG P23 Ab.: ABSENT
IgG P28 Ab.: ABSENT
IgG P30 Ab.: ABSENT
IgG P39 Ab.: ABSENT
IgG P41 Ab.: ABSENT
IgG P45 Ab.: ABSENT
IgG P58 Ab.: ABSENT
IgG P66 Ab.: ABSENT
IgG P93 Ab.: ABSENT
IgM P23 Ab.: ABSENT
IgM P41 Ab.: ABSENT
Lyme IgG Wb: NEGATIVE
Lyme IgM Wb: NEGATIVE

## 2019-04-19 LAB — THYROID PANEL WITH TSH
Free Thyroxine Index: 2.3 (ref 1.2–4.9)
T3 Uptake Ratio: 29 % (ref 24–39)
T4, Total: 7.8 ug/dL (ref 4.5–12.0)
TSH: 2.54 u[IU]/mL (ref 0.450–4.500)

## 2019-04-19 LAB — BMP8+EGFR
BUN/Creatinine Ratio: 19 (ref 12–28)
BUN: 23 mg/dL (ref 8–27)
CO2: 24 mmol/L (ref 20–29)
Calcium: 9.5 mg/dL (ref 8.7–10.3)
Chloride: 100 mmol/L (ref 96–106)
Creatinine, Ser: 1.23 mg/dL — ABNORMAL HIGH (ref 0.57–1.00)
GFR calc Af Amer: 47 mL/min/{1.73_m2} — ABNORMAL LOW (ref >59)
GFR calc non Af Amer: 40 mL/min/{1.73_m2} — ABNORMAL LOW (ref >59)
Glucose: 93 mg/dL (ref 65–99)
Potassium: 3.7 mmol/L (ref 3.5–5.2)
Sodium: 140 mmol/L (ref 134–144)

## 2019-04-19 LAB — LIPID PANEL
Chol/HDL Ratio: 4.9 ratio — ABNORMAL HIGH (ref 0.0–4.4)
Cholesterol, Total: 239 mg/dL — ABNORMAL HIGH (ref 100–199)
HDL: 49 mg/dL (ref >39)
LDL Calculated: 164 mg/dL — ABNORMAL HIGH (ref 0–99)
Triglycerides: 128 mg/dL (ref 0–149)
VLDL Cholesterol Cal: 26 mg/dL (ref 5–40)

## 2019-04-19 LAB — CBC WITH DIFFERENTIAL/PLATELET
Basophils Absolute: 0 10*3/uL (ref 0.0–0.2)
Basos: 1 %
EOS (ABSOLUTE): 0.2 10*3/uL (ref 0.0–0.4)
Eos: 2 %
Hematocrit: 45.8 % (ref 34.0–46.6)
Hemoglobin: 15.1 g/dL (ref 11.1–15.9)
Immature Grans (Abs): 0 10*3/uL (ref 0.0–0.1)
Immature Granulocytes: 0 %
Lymphocytes Absolute: 1.4 10*3/uL (ref 0.7–3.1)
Lymphs: 21 %
MCH: 30.4 pg (ref 26.6–33.0)
MCHC: 33 g/dL (ref 31.5–35.7)
MCV: 92 fL (ref 79–97)
Monocytes Absolute: 0.5 10*3/uL (ref 0.1–0.9)
Monocytes: 8 %
Neutrophils Absolute: 4.4 10*3/uL (ref 1.4–7.0)
Neutrophils: 68 %
Platelets: 289 10*3/uL (ref 150–450)
RBC: 4.97 x10E6/uL (ref 3.77–5.28)
RDW: 12.1 % (ref 11.7–15.4)
WBC: 6.5 10*3/uL (ref 3.4–10.8)

## 2019-04-19 LAB — HEPATIC FUNCTION PANEL
ALT: 11 IU/L (ref 0–32)
AST: 13 IU/L (ref 0–40)
Albumin: 4.4 g/dL (ref 3.6–4.6)
Alkaline Phosphatase: 108 IU/L (ref 39–117)
Bilirubin Total: 0.6 mg/dL (ref 0.0–1.2)
Bilirubin, Direct: 0.16 mg/dL (ref 0.00–0.40)
Total Protein: 7 g/dL (ref 6.0–8.5)

## 2019-04-19 LAB — LYME AB/WESTERN BLOT REFLEX
LYME DISEASE AB, QUANT, IGM: 0.9 index — ABNORMAL HIGH (ref 0.00–0.79)
Lyme IgG/IgM Ab: <0.91 {ISR} (ref 0.00–0.90)

## 2019-04-19 LAB — ROCKY MTN SPOTTED FVR ABS PNL(IGG+IGM)
RMSF IgG: NEGATIVE
RMSF IgM: 2.32 index — ABNORMAL HIGH (ref 0.00–0.89)

## 2019-04-19 LAB — VITAMIN D 25 HYDROXY (VIT D DEFICIENCY, FRACTURES): Vit D, 25-Hydroxy: 60.7 ng/mL (ref 30.0–100.0)

## 2019-04-19 LAB — SEDIMENTATION RATE: Sed Rate: 38 mm/hr (ref 0–40)

## 2019-04-22 ENCOUNTER — Telehealth: Payer: Self-pay | Admitting: Family Medicine

## 2019-04-23 NOTE — Telephone Encounter (Signed)
Mailed a copy of labs to pt

## 2019-04-30 ENCOUNTER — Telehealth: Payer: Self-pay | Admitting: Radiology

## 2019-04-30 ENCOUNTER — Other Ambulatory Visit: Payer: Self-pay

## 2019-04-30 ENCOUNTER — Ambulatory Visit: Payer: Medicare Other | Admitting: Orthopedic Surgery

## 2019-04-30 ENCOUNTER — Encounter: Payer: Self-pay | Admitting: Orthopedic Surgery

## 2019-04-30 VITALS — BP 168/94 | HR 98 | Temp 97.5°F | Ht 64.0 in | Wt 179.0 lb

## 2019-04-30 DIAGNOSIS — M1712 Unilateral primary osteoarthritis, left knee: Secondary | ICD-10-CM

## 2019-04-30 DIAGNOSIS — M1711 Unilateral primary osteoarthritis, right knee: Secondary | ICD-10-CM

## 2019-04-30 DIAGNOSIS — M7052 Other bursitis of knee, left knee: Secondary | ICD-10-CM | POA: Diagnosis not present

## 2019-04-30 DIAGNOSIS — M7051 Other bursitis of knee, right knee: Secondary | ICD-10-CM

## 2019-04-30 NOTE — Telephone Encounter (Signed)
Noted. Mailed to patient.

## 2019-04-30 NOTE — Progress Notes (Addendum)
Progress Note   Patient ID: Emily Wagner, female   DOB: 12/05/1932, 85 y.o.   MRN: [id number]   Chief Complaint  Patient presents with  . Knee Pain    Left knee a little better after injection    85 year old female had an injection for Pez bursitis she said that helped but the pain moved proximally and a new area.  Prior notes are noted below    85 year old female presents for evaluation of left knee pain   She has had pain left knee medial side 2 years received 2 injections and took a prednisone pack.  She says usually the injections help (Dr.'s notes indicate knee joint injection) but this time they did not and the prednisone pack did not help either and she asked for further evaluation   The pain is constant it is over the medial side of the posterior medial and medial joint line does not affect her walking but she does notice swelling over the Pez bursa  CLINICAL DATA:  Progressive left knee pain and swelling.   EXAM: LEFT KNEE - COMPLETE 4+ VIEW   COMPARISON:  Radiographs dated 03/13/2018   FINDINGS: No evidence of fracture, dislocation, or joint effusion. There is medial joint space narrowing with small marginal osteophytes in the patellofemoral and lateral compartments.   IMPRESSION: No acute bone abnormality. No joint effusion. Slight arthritic changes, stable.     Electronically Signed   By: Lorriane Shire M.D.   On: 03/25/2018 12:04   FINDINGS: No evidence of fracture, dislocation, or joint effusion. There is medial joint space narrowing with small marginal osteophytes in the patellofemoral and lateral compartments.   IMPRESSION: No acute bone abnormality. No joint effusion. Slight arthritic changes, stable. Procedure note Left knee injection for bursitis  verbal consent was obtained to inject Left knee PES BURSA   Review of Systems  Musculoskeletal: Positive for joint pain.  Skin: Negative.   Neurological: Negative for tingling.      Allergies  Allergen Reactions  . Nitrofurantoin Other (See Comments)    Liver failure  . Nsaids Other (See Comments)    Kidney failure  . Other Other (See Comments)    Most antibiotics require supervision per patient  . Flonase [Fluticasone Propionate] Other (See Comments)    headache  . Prevnar 13 [Pneumococcal 13-Val Conj Vacc] Other (See Comments)    Nerve damage in left arm  . Beta Adrenergic Blockers   . Potassium-Containing Compounds     Rash and raw areas / sore breast   . Alendronate Sodium Other (See Comments)  . Ciprofloxacin Other (See Comments)    Patient denies allergy--"I take Cipro for bladder infections"  . Codeine Other (See Comments)  . Crestor [Rosuvastatin Calcium] Other (See Comments)  . Darvon Other (See Comments)  . Lipitor [Atorvastatin Calcium] Other (See Comments)  . Pravachol Other (See Comments)  . Propranolol Hcl Other (See Comments)    Unknown  . Sulfa Antibiotics Other (See Comments)  . Sulfamethoxazole Other (See Comments)    Unknown  . Ultram [Tramadol Hcl] Itching  . Welchol [Colesevelam Hcl] Other (See Comments)  . Zetia [Ezetimibe] Other (See Comments)  . Zocor [Simvastatin] Other (See Comments)     BP (!) 168/94   Pulse 98   Temp (!) 97.5 F (36.4 C)   Ht 5\' 4"  (1.626 m)   Wt 179 lb (81.2 kg)   LMP 03/21/1971   BMI 30.73 kg/m   Physical Exam Musculoskeletal:  Left knee: She exhibits normal range of motion, no swelling, no effusion, no deformity, no LCL laxity, normal meniscus and no MCL laxity. Tenderness found. Medial joint line tenderness noted.       Legs:  Neurological:     Mental Status: She is alert and oriented to person, place, and time.     Motor: No tremor, atrophy or abnormal muscle tone.     Gait: Gait abnormal.     Comments: Cane right hand      Medical decisions:    Encounter Diagnoses  Name Primary?  . Primary osteoarthritis of right knee Yes  . Pes anserinus bursitis of right knee     PLAN:    Procedure note left knee injection   verbal consent was obtained to inject left knee joint  Timeout was completed to confirm the site of injection  The medications used were 40 mg of Depo-Medrol and 1% lidocaine 3 cc  Anesthesia was provided by ethyl chloride and the skin was prepped with alcohol.  After cleaning the skin with alcohol a 20-gauge needle was used to inject the left knee joint. There were no complications. A sterile bandage was applied.  Procedure note Left knee injection for bursitis   verbal consent was obtained to inject Left knee PES BURSA  Timeout was completed to confirm the site of injection  The medications used were 40 mg of Depo-Medrol and 1% lidocaine 3 cc  Anesthesia was provided by ethyl chloride and the skin was prepped with alcohol.  After cleaning the skin with alcohol a 25-gauge needle was used to inject the left knee bursa   There were no complications and a sterile bandage was applied    FU 4 WEEKS     Arther Abbott, MD 04/30/2019 8:53 AM

## 2019-04-30 NOTE — Telephone Encounter (Signed)
I have corrected it and I have reprinted the AVS

## 2019-04-30 NOTE — Telephone Encounter (Signed)
-----   Message from Uvaldo Bristle sent at 04/30/2019  9:01 AM EDT ----- Regarding: Left knee (AVS "Right knee" Patient Emily Wagner #[id number]) called back/called to attn that she was seen for Left knee and AVS shows Right knee. Thanks

## 2019-04-30 NOTE — Patient Instructions (Signed)
Ice daily 3 x a day   Tylenol 1-2 tabs 3 x a day   Apply ben gay 3 x a day

## 2019-05-08 ENCOUNTER — Telehealth: Payer: Self-pay | Admitting: Family Medicine

## 2019-05-08 NOTE — Telephone Encounter (Signed)
Pt called

## 2019-05-14 DIAGNOSIS — M5430 Sciatica, unspecified side: Secondary | ICD-10-CM | POA: Diagnosis not present

## 2019-05-14 DIAGNOSIS — M9903 Segmental and somatic dysfunction of lumbar region: Secondary | ICD-10-CM | POA: Diagnosis not present

## 2019-05-14 DIAGNOSIS — M9905 Segmental and somatic dysfunction of pelvic region: Secondary | ICD-10-CM | POA: Diagnosis not present

## 2019-05-14 DIAGNOSIS — M9904 Segmental and somatic dysfunction of sacral region: Secondary | ICD-10-CM | POA: Diagnosis not present

## 2019-05-15 DIAGNOSIS — M9903 Segmental and somatic dysfunction of lumbar region: Secondary | ICD-10-CM | POA: Diagnosis not present

## 2019-05-15 DIAGNOSIS — M9904 Segmental and somatic dysfunction of sacral region: Secondary | ICD-10-CM | POA: Diagnosis not present

## 2019-05-15 DIAGNOSIS — M9905 Segmental and somatic dysfunction of pelvic region: Secondary | ICD-10-CM | POA: Diagnosis not present

## 2019-05-15 DIAGNOSIS — M5430 Sciatica, unspecified side: Secondary | ICD-10-CM | POA: Diagnosis not present

## 2019-05-26 ENCOUNTER — Telehealth: Payer: Self-pay | Admitting: Family Medicine

## 2019-05-26 NOTE — Telephone Encounter (Signed)
Pt called  - still having back pain, wanted to use topical otc pain relief  ( like salonpas ) aware ok, to try.

## 2019-05-26 NOTE — Telephone Encounter (Signed)
Contacted patient.  Patient states that she does not want to talk to this nurse only wants to talk to Georgina Pillion, LPN. Informed patient that Georgina Pillion, LPN is working with another provider at this time, patient again declines to talk to this nurse.

## 2019-06-06 ENCOUNTER — Ambulatory Visit: Payer: Medicare Other | Admitting: Orthopedic Surgery

## 2019-06-09 ENCOUNTER — Ambulatory Visit (INDEPENDENT_AMBULATORY_CARE_PROVIDER_SITE_OTHER): Payer: Medicare Other | Admitting: Orthopedic Surgery

## 2019-06-09 ENCOUNTER — Encounter: Payer: Self-pay | Admitting: Orthopedic Surgery

## 2019-06-09 ENCOUNTER — Other Ambulatory Visit: Payer: Self-pay

## 2019-06-09 VITALS — BP 172/90 | HR 87 | Temp 99.7°F | Ht 64.0 in | Wt 179.0 lb

## 2019-06-09 DIAGNOSIS — M7052 Other bursitis of knee, left knee: Secondary | ICD-10-CM | POA: Diagnosis not present

## 2019-06-09 NOTE — Progress Notes (Signed)
Chief Complaint  Patient presents with  . Knee Pain    medial left knee pain     85 year old  female with pain in her left knee is received injections in the Pez bursa as well as the joint with some effect with decreasing pain but presents back with similar complaints, with pain localized over the medial hamstring tendons with acute on chronic bursitis  Review of systems no fever no rash no joint pain or joint swelling  Past Medical History:  Diagnosis Date  . Adjustment disorder with mixed anxiety and depressed mood   . Atrial fibrillation ([place]) 11/04/2015   Post-operative  . Benign hypertensive heart disease without heart failure   . CAD (coronary artery disease)   . Infection of urinary tract 10/31/2015  . Interstitial cystitis   . Osteopenia   . Other and unspecified hyperlipidemia   . Other dyspnea and respiratory abnormality   . Pelvic fracture ([place]) 05/30/2015  . 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  She is limping.  She has tenderness over the soft tissues of the medial Pez bursa her knee flexion arc is 125 degrees it feels stable she has good strength skin is normal distally she has mild edema normal sensation in the leg  Encounter Diagnosis  Name Primary?  . Pes anserinus bursitis of left knee Yes    Procedure note Left knee injection for bursitis   verbal consent was obtained to inject Left knee PES BURSA  Timeout was completed to confirm the site of injection  The medications used were 40 mg of Depo-Medrol and 1% lidocaine 3 cc  Anesthesia was provided by ethyl chloride and the skin was prepped with alcohol.  After cleaning the skin with alcohol a 25-gauge needle was used to inject the left knee bursa   There were no complications and a sterile bandage was applied   FU PRN

## 2019-06-24 ENCOUNTER — Telehealth: Payer: Self-pay | Admitting: Family Medicine

## 2019-06-24 NOTE — Telephone Encounter (Signed)
Patient states that Sharon told her to have her kidney function checked after being on the voltarne cream x 1 month. Patient has apt with Dr. Warrick Parisian in September and will wait if Dr. Warrick Parisian thinks that is okay. Aware you are out of the office today and will return 7/29.  Please advise

## 2019-06-25 ENCOUNTER — Other Ambulatory Visit: Payer: Self-pay

## 2019-06-25 DIAGNOSIS — I10 Essential (primary) hypertension: Secondary | ICD-10-CM

## 2019-06-25 NOTE — Telephone Encounter (Signed)
Future orders placed and patient notified

## 2019-06-25 NOTE — Telephone Encounter (Signed)
Yes she can go ahead and do this, please put in a basic metabolic panel and just have her come in and do the lab draw

## 2019-06-26 DIAGNOSIS — M79676 Pain in unspecified toe(s): Secondary | ICD-10-CM | POA: Diagnosis not present

## 2019-06-26 DIAGNOSIS — B351 Tinea unguium: Secondary | ICD-10-CM | POA: Diagnosis not present

## 2019-06-27 ENCOUNTER — Ambulatory Visit: Payer: Medicare Other | Admitting: Orthopedic Surgery

## 2019-07-03 ENCOUNTER — Other Ambulatory Visit: Payer: Medicare Other

## 2019-07-03 ENCOUNTER — Other Ambulatory Visit: Payer: Self-pay

## 2019-07-03 DIAGNOSIS — I10 Essential (primary) hypertension: Secondary | ICD-10-CM

## 2019-07-04 LAB — BMP8+EGFR
BUN/Creatinine Ratio: 19 (ref 12–28)
BUN: 21 mg/dL (ref 8–27)
CO2: 25 mmol/L (ref 20–29)
Calcium: 9.5 mg/dL (ref 8.7–10.3)
Chloride: 100 mmol/L (ref 96–106)
Creatinine, Ser: 1.1 mg/dL — ABNORMAL HIGH (ref 0.57–1.00)
GFR calc Af Amer: 53 mL/min/{1.73_m2} — ABNORMAL LOW (ref >59)
GFR calc non Af Amer: 46 mL/min/{1.73_m2} — ABNORMAL LOW (ref >59)
Glucose: 105 mg/dL — ABNORMAL HIGH (ref 65–99)
Potassium: 3.6 mmol/L (ref 3.5–5.2)
Sodium: 142 mmol/L (ref 134–144)

## 2019-08-05 ENCOUNTER — Telehealth: Payer: Self-pay | Admitting: Family Medicine

## 2019-08-05 NOTE — Telephone Encounter (Signed)
Pt aware.

## 2019-08-06 DIAGNOSIS — M5430 Sciatica, unspecified side: Secondary | ICD-10-CM | POA: Diagnosis not present

## 2019-08-06 DIAGNOSIS — M9905 Segmental and somatic dysfunction of pelvic region: Secondary | ICD-10-CM | POA: Diagnosis not present

## 2019-08-06 DIAGNOSIS — M9903 Segmental and somatic dysfunction of lumbar region: Secondary | ICD-10-CM | POA: Diagnosis not present

## 2019-08-06 DIAGNOSIS — M9904 Segmental and somatic dysfunction of sacral region: Secondary | ICD-10-CM | POA: Diagnosis not present

## 2019-08-13 ENCOUNTER — Ambulatory Visit: Payer: Medicare Other | Admitting: Family Medicine

## 2019-08-15 ENCOUNTER — Other Ambulatory Visit: Payer: Self-pay

## 2019-08-18 ENCOUNTER — Ambulatory Visit (INDEPENDENT_AMBULATORY_CARE_PROVIDER_SITE_OTHER): Payer: Medicare Other | Admitting: Family Medicine

## 2019-08-18 ENCOUNTER — Other Ambulatory Visit: Payer: Self-pay

## 2019-08-18 ENCOUNTER — Encounter: Payer: Self-pay | Admitting: Family Medicine

## 2019-08-18 VITALS — BP 175/99 | HR 85 | Temp 97.1°F | Resp 16 | Ht 64.0 in | Wt 171.6 lb

## 2019-08-18 DIAGNOSIS — N3010 Interstitial cystitis (chronic) without hematuria: Secondary | ICD-10-CM | POA: Diagnosis not present

## 2019-08-18 DIAGNOSIS — E7800 Pure hypercholesterolemia, unspecified: Secondary | ICD-10-CM | POA: Diagnosis not present

## 2019-08-18 DIAGNOSIS — E039 Hypothyroidism, unspecified: Secondary | ICD-10-CM

## 2019-08-18 DIAGNOSIS — I10 Essential (primary) hypertension: Secondary | ICD-10-CM | POA: Diagnosis not present

## 2019-08-18 DIAGNOSIS — F4323 Adjustment disorder with mixed anxiety and depressed mood: Secondary | ICD-10-CM | POA: Diagnosis not present

## 2019-08-18 DIAGNOSIS — Z79891 Long term (current) use of opiate analgesic: Secondary | ICD-10-CM | POA: Diagnosis not present

## 2019-08-18 DIAGNOSIS — Z23 Encounter for immunization: Secondary | ICD-10-CM | POA: Diagnosis not present

## 2019-08-18 MED ORDER — FAMOTIDINE 20 MG PO TABS: 20.0000 mg | ORAL_TABLET | Freq: Two times a day (BID) | ORAL | 3 refills | Status: DC

## 2019-08-18 MED ORDER — NORTRIPTYLINE HCL 25 MG PO CAPS: 25.0000 mg | ORAL_CAPSULE | Freq: Every day | ORAL | 3 refills | Status: DC

## 2019-08-18 MED ORDER — LORAZEPAM 0.5 MG PO TABS: 0.5000 mg | ORAL_TABLET | Freq: Two times a day (BID) | ORAL | 0 refills | Status: DC | PRN

## 2019-08-18 MED ORDER — CIPROFLOXACIN HCL 500 MG PO TABS: 500.0000 mg | ORAL_TABLET | Freq: Two times a day (BID) | ORAL | 0 refills | Status: DC

## 2019-08-18 MED ORDER — LEVOTHYROXINE SODIUM 50 MCG PO TABS: 50.0000 ug | ORAL_TABLET | Freq: Every day | ORAL | 3 refills | Status: DC

## 2019-08-18 NOTE — Progress Notes (Signed)
BP (!) 175/99   Pulse 85   Temp (!) 97.1 F (36.2 C) (Temporal)   Resp 16   Ht 5\' 4"  (1.626 m)   Wt 171 lb 9.6 oz (77.8 kg)   LMP 03/21/1971   SpO2 96%   BMI 29.46 kg/m    Subjective:   Patient ID: Emily Wagner, female    DOB: 04/20/1933, 85 y.o.   MRN: [id number]  HPI: Emily Wagner is a 85 y.o. female presenting on 08/18/2019 for Establish Care and Hypertension (check up of chronic mdeical conditions)   HPI Hypertension Patient is currently on amlodipine and hydrochlorothiazide, and their blood pressure today is 175/99 but patient says her home blood pressures run a lot better in the 130s.. Patient denies any lightheadedness or dizziness. Patient denies headaches, blurred vision, chest pains, shortness of breath, or weakness. Denies any side effects from medication and is content with current medication.   Hyperlipidemia Patient is coming in for recheck of his hyperlipidemia. The patient is currently taking no medication. They deny any issues with myalgias or history of liver damage from it. They deny any focal numbness or weakness or chest pain.   Hypothyroidism recheck Patient is coming in for thyroid recheck today as well. They deny any issues with hair changes or heat or cold problems or diarrhea or constipation. They deny any chest pain or palpitations. They are currently on levothyroxine 22micrograms   Anxiety depression recheck Patient is currently on lorazepam for anxiety depression but mostly to help with sleep.  We discussed the risks with continuing the alprazolam and versus the benefits and we discussed possibly trying something else to sleep as that is her biggest issues that she get anxious at night and has trouble falling asleep.  Patient denies any suicidal ideations or thoughts of hurting herself. Depression screen [facility] 2/9 08/18/2019 12/06/2018 08/23/2018 08/23/2018 07/09/2018  Decreased Interest 0 0 0 0 0  Down, Depressed, Hopeless 0 1 0 0 0  PHQ - 2 Score  0 1 0 0 0    Has chronic interstitial cystitis and says her previous physician gave her refills of Cipro that she uses whenever she gets a flareup of it.  We discussed going to see urology and she would like to hold off on that and just continue with the Cipro as needed.  Patient denies any symptoms today  Relevant past medical, surgical, family and social history reviewed and updated as indicated. Interim medical history since our last visit reviewed. Allergies and medications reviewed and updated.  Review of Systems  Constitutional: Negative for chills and fever.  Eyes: Negative for visual disturbance.  Respiratory: Negative for chest tightness and shortness of breath.   Cardiovascular: Negative for chest pain and leg swelling.  Musculoskeletal: Negative for back pain and gait problem.  Skin: Negative for rash.  Neurological: Negative for dizziness, light-headedness and headaches.  Psychiatric/Behavioral: Positive for sleep disturbance. Negative for agitation, behavioral problems and dysphoric mood. The patient is not nervous/anxious.   All other systems reviewed and are negative.   Per HPI unless specifically indicated above   Allergies as of 08/18/2019      Reactions   Nitrofurantoin Other (See Comments)   Liver failure   Nsaids Other (See Comments)   Kidney failure   Other Other (See Comments)   Most antibiotics require supervision per patient   Flonase [fluticasone Propionate] Other (See Comments)   headache   Prevnar 13 [pneumococcal 13-val Conj Vacc] Other (See Comments)  Nerve damage in left arm   Beta Adrenergic Blockers    Potassium-containing Compounds    Rash and raw areas / sore breast    Alendronate Sodium Other (See Comments)   Ciprofloxacin Other (See Comments)   Patient denies allergy--"I take Cipro for bladder infections"   Codeine Other (See Comments)   Crestor [rosuvastatin Calcium] Other (See Comments)   Darvon Other (See Comments)   Lipitor  [atorvastatin Calcium] Other (See Comments)   Pravachol Other (See Comments)   Propranolol Hcl Other (See Comments)   Unknown   Sulfa Antibiotics Other (See Comments)   Sulfamethoxazole Other (See Comments)   Unknown   Ultram [tramadol Hcl] Itching   Welchol [colesevelam Hcl] Other (See Comments)   Zetia [ezetimibe] Other (See Comments)   Zocor [simvastatin] Other (See Comments)      Medication List       Accurate as of August 18, 2019 10:17 AM. If you have any questions, ask your nurse or doctor.        amLODipine 5 MG tablet Commonly known as: NORVASC TAKE 1 TABLET (5 MG TOTAL) DAILY BY MOUTH.   aspirin EC 81 MG tablet Take 81 mg by mouth daily.   ciprofloxacin 500 MG tablet Commonly known as: Cipro Take 1 tablet (500 mg total) by mouth 2 (two) times daily. Started by: Worthy Rancher, MD   famotidine 20 MG tablet Commonly known as: Pepcid Take 1 tablet (20 mg total) by mouth 2 (two) times daily.   hydrochlorothiazide 25 MG tablet Commonly known as: HYDRODIURIL Take 1 tablet (25 mg total) by mouth daily.   levothyroxine 50 MCG tablet Commonly known as: SYNTHROID Take 1 tablet (50 mcg total) by mouth daily before breakfast.   LORazepam 0.5 MG tablet Commonly known as: ATIVAN Take 1 tablet (0.5 mg total) by mouth 2 (two) times daily as needed. Start taking on: November 10, 2019 What changed: These instructions start on November 10, 2019. If you are unsure what to do until then, ask your doctor or other care provider. Changed by: Worthy Rancher, MD   meclizine 12.5 MG tablet Commonly known as: ANTIVERT Take 1 tablet (12.5 mg total) by mouth 3 (three) times daily as needed for dizziness.   nortriptyline 25 MG capsule Commonly known as: Pamelor Take 1 capsule (25 mg total) by mouth at bedtime. Started by: Worthy Rancher, MD   PRESERVISION AREDS 2 PO Take 1 capsule by mouth at bedtime.   VITAMIN B-12 PO Take 600 mcg by mouth every morning.    Vitamin D 50 MCG (2000 UT) Caps Take 2,000 Units by mouth daily.        Objective:   BP (!) 175/99   Pulse 85   Temp (!) 97.1 F (36.2 C) (Temporal)   Resp 16   Ht 5\' 4"  (1.626 m)   Wt 171 lb 9.6 oz (77.8 kg)   LMP 03/21/1971   SpO2 96%   BMI 29.46 kg/m   Wt Readings from Last 3 Encounters:  08/18/19 171 lb 9.6 oz (77.8 kg)  06/09/19 179 lb (81.2 kg)  04/30/19 179 lb (81.2 kg)    Physical Exam Vitals signs and nursing note reviewed.  Constitutional:      General: She is not in acute distress.    Appearance: She is well-developed. She is not diaphoretic.  Eyes:     Conjunctiva/sclera: Conjunctivae normal.  Cardiovascular:     Rate and Rhythm: Normal rate and regular rhythm.  Heart sounds: Normal heart sounds. No murmur.  Pulmonary:     Effort: Pulmonary effort is normal. No respiratory distress.     Breath sounds: Normal breath sounds. No wheezing.  Musculoskeletal: Normal range of motion.        General: No tenderness.  Skin:    General: Skin is warm and dry.     Findings: No rash.  Neurological:     Mental Status: She is alert and oriented to person, place, and time.     Coordination: Coordination normal.  Psychiatric:        Mood and Affect: Mood is anxious and depressed.        Behavior: Behavior normal.        Thought Content: Thought content does not include suicidal ideation. Thought content does not include suicidal plan.       Assessment & Plan:   Problem List Items Addressed This Visit      Cardiovascular and Mediastinum   Essential hypertension   Relevant Medications   levothyroxine (SYNTHROID) 50 MCG tablet     Genitourinary   Interstitial cystitis   Relevant Medications   famotidine (PEPCID) 20 MG tablet   nortriptyline (PAMELOR) 25 MG capsule   ciprofloxacin (CIPRO) 500 MG tablet     Other   HYPERCHOLESTEROLEMIA - Primary   Adjustment disorder with mixed anxiety and depressed mood   Relevant Medications   LORazepam (ATIVAN)  0.5 MG tablet (Start on 11/10/2019)   Other Relevant Orders   ToxASSURE Select 13 (MW), Urine (Completed)    Other Visit Diagnoses    Acquired hypothyroidism       Relevant Medications   levothyroxine (SYNTHROID) 50 MCG tablet   Other Relevant Orders   TSH (Completed)   Need for immunization against influenza       Relevant Orders   Flu Vaccine QUAD High Dose(Fluad) (Completed)      We will try nortriptyline for interstitial cystitis and anxiety and see if we can reduce the alprazolam, for now she has 97-month supply already so she will try to take the nortriptyline first and try and hold off on the other until after. Follow up plan: Return in about 4 months (around 12/18/2019), or if symptoms worsen or fail to improve, for anxiety and htn.  Counseling provided for all of the vaccine components Orders Placed This Encounter  Procedures  . ToxASSURE Select 13 (MW), Urine  . TSH    Caryl Pina, MD [facility] 08/18/2019, 10:17 AM

## 2019-08-19 DIAGNOSIS — E785 Hyperlipidemia, unspecified: Secondary | ICD-10-CM | POA: Insufficient documentation

## 2019-08-19 LAB — TSH: TSH: 3.81 u[IU]/mL (ref 0.450–4.500)

## 2019-08-19 NOTE — Progress Notes (Signed)
Cardiology Office Note   Date:  08/20/2019   ID:  Emily Wagner, DOB 02/14/1933, MRN [id number]  PCP:  [name], [name], MD  Cardiologist:   No primary care provider on file.   No chief complaint on file.     History of Present Illness: Emily Wagner is a 85 y.o. female who presents for evaluation of atrial fib.     She has a history of  bypass surgery.  She has had fluctuating BPs.  Since I last saw her she has still suffered with the effects of [facility] spotted fever.  She has fatigue and joint pains.  She gets around slowly.  She is not describing acute cardiovascular symptoms. The patient denies any new symptoms such as chest discomfort, neck or arm discomfort. There has been no new shortness of breath, PND or orthopnea. There have been no reported palpitations, presyncope or syncope.     Past Medical History:  Diagnosis Date  . Adjustment disorder with mixed anxiety and depressed mood   . Atrial fibrillation ([place]) 11/04/2015   Post-operative  . Benign hypertensive heart disease without heart failure   . CAD (coronary artery disease)   . Infection of urinary tract 10/31/2015  . Interstitial cystitis   . Osteopenia   . Other and unspecified hyperlipidemia   . Other dyspnea and respiratory abnormality   . Pelvic fracture ([place]) 05/30/2015  . 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 ABDOMINAL HYSTERECTOMY     Cancer and endometriosis  . CARDIAC CATHETERIZATION  2008  . CARDIAC CATHETERIZATION N/A 10/29/2015   Procedure: Left Heart Cath and Coronary Angiography;  Surgeon: Belva Crome, MD;  Location: [place] [facility];  Service: Cardiovascular;  Laterality: N/A;  . CATARACT EXTRACTION    . CHOLECYSTECTOMY    . CORONARY ARTERY BYPASS GRAFT N/A 11/02/2015   Procedure: OFF PUMP CORONARY ARTERY BYPASS GRAFTING (CABG) TIMES ONE;  Surgeon: Rexene Alberts, MD;  Location: [place];  Service: Open Heart  Surgery;  Laterality: N/A;  LIMA to LAD  . FEMUR FRACTURE SURGERY  09/2015   right hip compression screws   . FRACTURE SURGERY     Right hip  . HERNIA REPAIR     Right inguinal  . TEE WITHOUT CARDIOVERSION N/A 11/02/2015   Procedure: TRANSESOPHAGEAL ECHOCARDIOGRAM (TEE);  Surgeon: Rexene Alberts, MD;  Location: [place];  Service: Open Heart Surgery;  Laterality: N/A;     Current Outpatient Medications  Medication Sig Dispense Refill  . amLODipine (NORVASC) 5 MG tablet TAKE 1 TABLET (5 MG TOTAL) DAILY BY MOUTH. 90 tablet 3  . aspirin EC 81 MG tablet Take 81 mg by mouth daily.    . Cholecalciferol (VITAMIN D) 2000 UNITS CAPS Take 2,000 Units by mouth daily.     . ciprofloxacin (CIPRO) 500 MG tablet Take 1 tablet (500 mg total) by mouth 2 (two) times daily. 60 tablet 0  . Cyanocobalamin (VITAMIN B-12 PO) Take 600 mcg by mouth every morning.     . famotidine (PEPCID) 20 MG tablet Take 1 tablet (20 mg total) by mouth 2 (two) times daily. 180 tablet 3  . hydrochlorothiazide (HYDRODIURIL) 25 MG tablet Take 1 tablet (25 mg total) by mouth daily. 90 tablet 2  . levothyroxine (SYNTHROID) 50 MCG tablet Take 1 tablet (50 mcg total) by mouth daily before breakfast. 90 tablet 3  . [  START ON 11/10/2019] LORazepam (ATIVAN) 0.5 MG tablet Take 1 tablet (0.5 mg total) by mouth 2 (two) times daily as needed. 60 tablet 0  . meclizine (ANTIVERT) 12.5 MG tablet Take 1 tablet (12.5 mg total) by mouth 3 (three) times daily as needed for dizziness. 90 tablet 0  . Multiple Vitamins-Minerals (PRESERVISION AREDS 2 PO) Take 1 capsule by mouth at bedtime.    . nortriptyline (PAMELOR) 25 MG capsule Take 1 capsule (25 mg total) by mouth at bedtime. 30 capsule 3   No current facility-administered medications for this visit.     Allergies:   Nitrofurantoin, Nsaids, Other, Flonase [fluticasone propionate], Prevnar 13 [pneumococcal 13-val conj vacc], Beta adrenergic blockers, Potassium-containing compounds, Alendronate  sodium, Ciprofloxacin, Codeine, Crestor [rosuvastatin calcium], Darvon, Lipitor [atorvastatin calcium], Pravachol, Propranolol hcl, Sulfa antibiotics, Sulfamethoxazole, Ultram [tramadol hcl], Welchol [colesevelam hcl], Zetia [ezetimibe], and Zocor [simvastatin]    ROS:  Please see the history of present illness.   Otherwise, review of systems are positive for none fatigue, headaches, tremors, weakness.   All other systems are reviewed and negative.    PHYSICAL EXAM: VS:  BP (!) 140/98   Pulse 93   Ht 5\' 4"  (1.626 m)   Wt 171 lb (77.6 kg)   LMP 03/21/1971   BMI 29.35 kg/m  , BMI Body mass index is 29.35 kg/m.  GENERAL:  Well appearing NECK:  No jugular venous distention, waveform within normal limits, carotid upstroke brisk and symmetric, no bruits, no thyromegaly LUNGS:  Clear to auscultation bilaterally CHEST:  Unremarkable HEART:  PMI not displaced or sustained,S1 and S2 within normal limits, no S3, no S4, no clicks, no rubs, no murmurs ABD:  Flat, positive bowel sounds normal in frequency in pitch, no bruits, no rebound, no guarding, no midline pulsatile mass, no hepatomegaly, no splenomegaly EXT:  2 plus pulses throughout, no edema, no cyanosis no clubbing   EKG:  EKG is   ordered today. The ekg ordered 02/02/17 demonstrates sinus rhythm, rate 93, premature atrial contractions, axis within normal limits, possible old inferior infarct.   Recent Labs: 04/17/2019: ALT 11; Hemoglobin 15.1; Platelets 289 07/03/2019: BUN 21; Creatinine, Ser 1.10; Potassium 3.6; Sodium 142 08/18/2019: TSH 3.810    Lipid Panel    Component Value Date/Time   CHOL 239 (H) 04/17/2019 1015   TRIG 128 04/17/2019 1015   TRIG 162 (H) 06/24/2015 0918   HDL 49 04/17/2019 1015   HDL 44 06/24/2015 0918   CHOLHDL 4.9 (H) 04/17/2019 1015   CHOLHDL 5.4 10/31/2015 0215   VLDL 20 10/31/2015 0215   LDLCALC 164 (H) 04/17/2019 1015   LDLCALC 163 (H) 05/14/2014 1038      Wt Readings from Last 3 Encounters:   08/20/19 171 lb (77.6 kg)  08/18/19 171 lb 9.6 oz (77.8 kg)  06/09/19 179 lb (81.2 kg)      Other studies Reviewed: Additional studies/ records that were reviewed today include: Labs Review of the above records demonstrates:  See below.    ASSESSMENT AND PLAN:   CAD:    The patient has no new sypmtoms.  No further cardiovascular testing is indicated.  We will continue with aggressive risk reduction and meds as listed.  DYSLIPIDEMIA:     She is been intolerant of statins.  No change in therapy. She has not wanted PCSK9.   HTN:  Her BP is OK.  I reviewed the blood pressure diary.  No change in therapy.    Current medicines are reviewed at length with  the patient today.  The patient does not have concerns regarding medicines.  The following changes have been made:  None  Labs/ tests ordered today include: None  No orders of the defined types were placed in this encounter.    Disposition:   FU with me in one year.    Signed, Minus Breeding, MD  08/20/2019 9:50 AM    [facility]

## 2019-08-20 ENCOUNTER — Encounter: Payer: Self-pay | Admitting: Cardiology

## 2019-08-20 ENCOUNTER — Other Ambulatory Visit: Payer: Self-pay

## 2019-08-20 ENCOUNTER — Ambulatory Visit (INDEPENDENT_AMBULATORY_CARE_PROVIDER_SITE_OTHER): Payer: Medicare Other | Admitting: Cardiology

## 2019-08-20 VITALS — BP 140/98 | HR 93 | Ht 64.0 in | Wt 171.0 lb

## 2019-08-20 DIAGNOSIS — E785 Hyperlipidemia, unspecified: Secondary | ICD-10-CM | POA: Diagnosis not present

## 2019-08-20 DIAGNOSIS — I10 Essential (primary) hypertension: Secondary | ICD-10-CM | POA: Diagnosis not present

## 2019-08-20 DIAGNOSIS — I2510 Atherosclerotic heart disease of native coronary artery without angina pectoris: Secondary | ICD-10-CM | POA: Diagnosis not present

## 2019-08-20 MED ORDER — AMLODIPINE BESYLATE 5 MG PO TABS: 5.0000 mg | ORAL_TABLET | Freq: Every day | ORAL | 3 refills | Status: DC

## 2019-08-20 MED ORDER — HYDROCHLOROTHIAZIDE 25 MG PO TABS: 25.0000 mg | ORAL_TABLET | Freq: Every day | ORAL | 3 refills | Status: DC

## 2019-08-20 NOTE — Patient Instructions (Signed)
Medication Instructions:  The current medical regimen is effective;  continue present plan and medications.  If you need a refill on your cardiac medications before your next appointment, please call your pharmacy.   Follow-Up: Follow up in 1 year with Dr. Hochrein.  You will receive a letter in the mail 2 months before you are due.  Please call us when you receive this letter to schedule your follow up appointment.  Thank you for choosing [facility]!!     

## 2019-08-21 LAB — TOXASSURE SELECT 13 (MW), URINE

## 2019-09-01 NOTE — Addendum Note (Signed)
Addended by: Patterson Hammersmith A on: 09/01/2019 04:42 PM   Modules accepted: Orders

## 2019-10-30 DIAGNOSIS — B351 Tinea unguium: Secondary | ICD-10-CM | POA: Diagnosis not present

## 2019-10-30 DIAGNOSIS — M79676 Pain in unspecified toe(s): Secondary | ICD-10-CM | POA: Diagnosis not present

## 2019-11-08 ENCOUNTER — Other Ambulatory Visit: Payer: Self-pay | Admitting: Family Medicine

## 2019-11-08 DIAGNOSIS — F4323 Adjustment disorder with mixed anxiety and depressed mood: Secondary | ICD-10-CM

## 2019-11-12 ENCOUNTER — Other Ambulatory Visit: Payer: Self-pay | Admitting: Family Medicine

## 2019-11-12 DIAGNOSIS — F4323 Adjustment disorder with mixed anxiety and depressed mood: Secondary | ICD-10-CM

## 2019-11-18 ENCOUNTER — Other Ambulatory Visit: Payer: Self-pay | Admitting: Family Medicine

## 2019-11-18 DIAGNOSIS — F4323 Adjustment disorder with mixed anxiety and depressed mood: Secondary | ICD-10-CM

## 2019-11-18 NOTE — Telephone Encounter (Signed)
Patient called requesting that her [facility] Rx [facility] [place] [facility]. Patient says she is about to run out of her medication.

## 2019-11-19 ENCOUNTER — Telehealth: Payer: Self-pay | Admitting: Family Medicine

## 2019-11-19 DIAGNOSIS — F4323 Adjustment disorder with mixed anxiety and depressed mood: Secondary | ICD-10-CM

## 2019-11-19 MED ORDER — LORAZEPAM 0.5 MG PO TABS: 0.5000 mg | ORAL_TABLET | Freq: Two times a day (BID) | ORAL | 0 refills | Status: DC | PRN

## 2019-11-19 NOTE — Telephone Encounter (Signed)
Pt aware.

## 2019-11-19 NOTE — Telephone Encounter (Signed)
Please let her know that I have sent it for refill but also verify the pharmacy that they do not have the other prescription

## 2019-11-19 NOTE — Telephone Encounter (Signed)
Looks like rx for nerve med that was sent on 12/14 didn't go through to pharmacy. Can you please resend?

## 2019-11-19 NOTE — Telephone Encounter (Signed)
[facility] 11/10/19 was not received

## 2019-11-19 NOTE — Addendum Note (Signed)
Addended by: Antonietta Barcelona D on: 11/19/2019 08:23 AM   Modules accepted: Orders

## 2019-12-16 DIAGNOSIS — H04123 Dry eye syndrome of bilateral lacrimal glands: Secondary | ICD-10-CM | POA: Diagnosis not present

## 2019-12-16 DIAGNOSIS — H353131 Nonexudative age-related macular degeneration, bilateral, early dry stage: Secondary | ICD-10-CM | POA: Diagnosis not present

## 2019-12-16 DIAGNOSIS — H0102A Squamous blepharitis right eye, upper and lower eyelids: Secondary | ICD-10-CM | POA: Diagnosis not present

## 2019-12-16 DIAGNOSIS — Z961 Presence of intraocular lens: Secondary | ICD-10-CM | POA: Diagnosis not present

## 2019-12-16 DIAGNOSIS — H0102B Squamous blepharitis left eye, upper and lower eyelids: Secondary | ICD-10-CM | POA: Diagnosis not present

## 2019-12-19 ENCOUNTER — Ambulatory Visit: Payer: Medicare Other | Admitting: Family Medicine

## 2019-12-19 ENCOUNTER — Other Ambulatory Visit: Payer: Self-pay

## 2019-12-22 ENCOUNTER — Other Ambulatory Visit: Payer: Self-pay

## 2019-12-22 ENCOUNTER — Ambulatory Visit (INDEPENDENT_AMBULATORY_CARE_PROVIDER_SITE_OTHER): Payer: Medicare Other | Admitting: Family Medicine

## 2019-12-22 ENCOUNTER — Encounter: Payer: Self-pay | Admitting: Family Medicine

## 2019-12-22 VITALS — BP 178/105 | HR 86 | Temp 98.7°F | Ht 64.0 in | Wt 166.0 lb

## 2019-12-22 DIAGNOSIS — F4323 Adjustment disorder with mixed anxiety and depressed mood: Secondary | ICD-10-CM | POA: Diagnosis not present

## 2019-12-22 DIAGNOSIS — I700 Atherosclerosis of aorta: Secondary | ICD-10-CM | POA: Diagnosis not present

## 2019-12-22 DIAGNOSIS — L570 Actinic keratosis: Secondary | ICD-10-CM

## 2019-12-22 DIAGNOSIS — I480 Paroxysmal atrial fibrillation: Secondary | ICD-10-CM | POA: Diagnosis not present

## 2019-12-22 DIAGNOSIS — E039 Hypothyroidism, unspecified: Secondary | ICD-10-CM | POA: Diagnosis not present

## 2019-12-22 DIAGNOSIS — I10 Essential (primary) hypertension: Secondary | ICD-10-CM

## 2019-12-22 DIAGNOSIS — E7800 Pure hypercholesterolemia, unspecified: Secondary | ICD-10-CM | POA: Diagnosis not present

## 2019-12-22 MED ORDER — LORAZEPAM 0.5 MG PO TABS: 0.5000 mg | ORAL_TABLET | Freq: Two times a day (BID) | ORAL | 3 refills | Status: DC | PRN

## 2019-12-22 NOTE — Addendum Note (Signed)
Addended by: Caryl Pina on: 12/22/2019 09:46 AM   Modules accepted: Orders

## 2019-12-22 NOTE — Progress Notes (Signed)
BP (!) 178/105   Pulse 86   Temp 98.7 F (37.1 C) (Temporal)   Ht 5\' 4"  (1.626 m)   Wt 166 lb (75.3 kg)   LMP 03/21/1971   SpO2 94%   BMI 28.49 kg/m    Subjective:   Patient ID: Emily Wagner, female    DOB: 1933-09-04, 87 y.o.   MRN: [id number]  HPI: Emily Wagner is a 86 y.o. female presenting on 12/22/2019 for Hypertension (4 month follow up)   HPI Anxiety  Current rx-Ativan 0.5 mg twice daily as needed # meds rx-60 Effectiveness of current meds-works well, we tried her on a couple others and she had reactions to them including most recently nortriptyline Adverse reactions form meds-none  Pill count performed-No Last drug screen -08/27/2019 ( high risk q24m, moderate risk q41m, low risk yearly ) Urine drug screen today- No Was the [place] reviewed-yes  If yes were their any concerning findings? -None  No flowsheet data found.   Controlled substance contract signed on: 08/27/2019  Skin lesions on face that are rough and not healing that she is concerned about, but one on left temple, one just above her left eye and one just above her right eye.  The largest is on the left temple that just have a rough skin appearance, no erythema or drainage.  Patient has A. fib and hypertension and cholesterol and follows up with cardiology for these.  Her cardiologist Dr. Percival Spanish she is also been diagnosed with atherosclerosis of the aorta and is also being managed with this from Dr. Percival Spanish as well she is on anticoagulation  Relevant past medical, surgical, family and social history reviewed and updated as indicated. Interim medical history since our last visit reviewed. Allergies and medications reviewed and updated.  Review of Systems  Constitutional: Negative for chills and fever.  Eyes: Negative for visual disturbance.  Respiratory: Negative for chest tightness and shortness of breath.   Cardiovascular: Negative for chest pain and leg swelling.  Musculoskeletal:  Negative for back pain and gait problem.  Skin: Negative for rash.  Neurological: Negative for light-headedness and headaches.  Psychiatric/Behavioral: Negative for agitation, behavioral problems, dysphoric mood, self-injury, sleep disturbance and suicidal ideas. The patient is nervous/anxious.   All other systems reviewed and are negative.   Per HPI unless specifically indicated above   Allergies as of 12/22/2019      Reactions   Nitrofurantoin Other (See Comments)   Liver failure   Nsaids Other (See Comments)   Kidney failure   Other Other (See Comments)   Most antibiotics require supervision per patient   Flonase [fluticasone Propionate] Other (See Comments)   headache   Prevnar 13 [pneumococcal 13-val Conj Vacc] Other (See Comments)   Nerve damage in left arm   Beta Adrenergic Blockers    Nortriptyline Hcl Other (See Comments)   States she felt like worms where crawling under her skin.   Potassium-containing Compounds    Rash and raw areas / sore breast    Alendronate Sodium Other (See Comments)   Ciprofloxacin Other (See Comments)   Patient denies allergy--"I take Cipro for bladder infections"   Codeine Other (See Comments)   Crestor [rosuvastatin Calcium] Other (See Comments)   Darvon Other (See Comments)   Lipitor [atorvastatin Calcium] Other (See Comments)   Pravachol Other (See Comments)   Propranolol Hcl Other (See Comments)   Unknown   Sulfa Antibiotics Other (See Comments)   Sulfamethoxazole Other (See Comments)   Unknown  Ultram [tramadol Hcl] Itching   Welchol [colesevelam Hcl] Other (See Comments)   Zetia [ezetimibe] Other (See Comments)   Zocor [simvastatin] Other (See Comments)      Medication List       Accurate as of December 22, 2019  9:11 AM. If you have any questions, ask your nurse or doctor.        STOP taking these medications   ciprofloxacin 500 MG tablet Commonly known as: Cipro Stopped by: Worthy Rancher, MD   meclizine 12.5 MG  tablet Commonly known as: ANTIVERT Stopped by: Worthy Rancher, MD   nortriptyline 25 MG capsule Commonly known as: Teacher, music Stopped by: Worthy Rancher, MD     TAKE these medications   amLODipine 5 MG tablet Commonly known as: NORVASC Take 1 tablet (5 mg total) by mouth daily.   aspirin EC 81 MG tablet Take 81 mg by mouth daily.   famotidine 20 MG tablet Commonly known as: Pepcid Take 1 tablet (20 mg total) by mouth 2 (two) times daily.   hydrochlorothiazide 25 MG tablet Commonly known as: HYDRODIURIL Take 1 tablet (25 mg total) by mouth daily.   levothyroxine 50 MCG tablet Commonly known as: SYNTHROID Take 1 tablet (50 mcg total) by mouth daily before breakfast.   LORazepam 0.5 MG tablet Commonly known as: ATIVAN Take 1 tablet (0.5 mg total) by mouth 2 (two) times daily as needed.   PRESERVISION AREDS 2 PO Take 1 capsule by mouth at bedtime.   VITAMIN B-12 PO Take 600 mcg by mouth every morning.   Vitamin D 50 MCG (2000 UT) Caps Take 2,000 Units by mouth daily.        Objective:   BP (!) 178/105   Pulse 86   Temp 98.7 F (37.1 C) (Temporal)   Ht 5\' 4"  (1.626 m)   Wt 166 lb (75.3 kg)   LMP 03/21/1971   SpO2 94%   BMI 28.49 kg/m   Wt Readings from Last 3 Encounters:  12/22/19 166 lb (75.3 kg)  08/20/19 171 lb (77.6 kg)  08/18/19 171 lb 9.6 oz (77.8 kg)    Physical Exam Vitals and nursing note reviewed.  Constitutional:      General: She is not in acute distress.    Appearance: She is well-developed. She is not diaphoretic.  Eyes:     Conjunctiva/sclera: Conjunctivae normal.  Cardiovascular:     Rate and Rhythm: Normal rate and regular rhythm.     Heart sounds: Normal heart sounds. No murmur.  Pulmonary:     Effort: Pulmonary effort is normal. No respiratory distress.     Breath sounds: Normal breath sounds. No wheezing.  Musculoskeletal:        General: No tenderness. Normal range of motion.  Skin:    General: Skin is warm and dry.       Findings: Lesion present. No rash.     Comments: 3 skin lesions on forehead, one on left temple that is about three quarters of a centimeter in diameter and a very small pinpoint one on the left eyebrow line and one on her right forehead that is about 0.2 cm in diameter that all had rough consistency consistent with an actinic keratosis likely  Neurological:     Mental Status: She is alert and oriented to person, place, and time.     Coordination: Coordination normal.  Psychiatric:        Mood and Affect: Mood is anxious and depressed.  Behavior: Behavior normal.        Thought Content: Thought content does not include suicidal ideation. Thought content does not include suicidal plan.       Assessment & Plan:   Problem List Items Addressed This Visit      Cardiovascular and Mediastinum   Essential hypertension   Atrial fibrillation (HCC)   Aortic atherosclerosis ([place])     Other   HYPERCHOLESTEROLEMIA - Primary   Adjustment disorder with mixed anxiety and depressed mood   Relevant Medications   LORazepam (ATIVAN) 0.5 MG tablet    Other Visit Diagnoses    Actinic keratosis          Continue lorazepam, the rest of her medication mostly comes to cardiology  Performed cryotherapy on 3 spots on her forehead, one on left temple and one just above her left eyebrow and 1 on her right forehead above her right eye.  The largest was on her left temple, had the consistency of actinic keratosis Follow up plan: Return in about 4 months (around 04/20/2020), or if symptoms worsen or fail to improve, for Anxiety.  Counseling provided for all of the vaccine components No orders of the defined types were placed in this encounter.   Caryl Pina, MD [facility] 12/22/2019, 9:11 AM

## 2019-12-23 LAB — CBC WITH DIFFERENTIAL/PLATELET
Basophils Absolute: 0 10*3/uL (ref 0.0–0.2)
Basos: 1 %
EOS (ABSOLUTE): 0.2 10*3/uL (ref 0.0–0.4)
Eos: 3 %
Hematocrit: 45 % (ref 34.0–46.6)
Hemoglobin: 15.1 g/dL (ref 11.1–15.9)
Immature Grans (Abs): 0 10*3/uL (ref 0.0–0.1)
Immature Granulocytes: 0 %
Lymphocytes Absolute: 1.4 10*3/uL (ref 0.7–3.1)
Lymphs: 23 %
MCH: 30.8 pg (ref 26.6–33.0)
MCHC: 33.6 g/dL (ref 31.5–35.7)
MCV: 92 fL (ref 79–97)
Monocytes Absolute: 0.6 10*3/uL (ref 0.1–0.9)
Monocytes: 9 %
Neutrophils Absolute: 3.9 10*3/uL (ref 1.4–7.0)
Neutrophils: 64 %
Platelets: 223 10*3/uL (ref 150–450)
RBC: 4.9 x10E6/uL (ref 3.77–5.28)
RDW: 12.3 % (ref 11.7–15.4)
WBC: 6.1 10*3/uL (ref 3.4–10.8)

## 2019-12-23 LAB — LIPID PANEL
Chol/HDL Ratio: 5.6 ratio — ABNORMAL HIGH (ref 0.0–4.4)
Cholesterol, Total: 220 mg/dL — ABNORMAL HIGH (ref 100–199)
HDL: 39 mg/dL — ABNORMAL LOW (ref >39)
LDL Chol Calc (NIH): 152 mg/dL — ABNORMAL HIGH (ref 0–99)
Triglycerides: 161 mg/dL — ABNORMAL HIGH (ref 0–149)
VLDL Cholesterol Cal: 29 mg/dL (ref 5–40)

## 2019-12-23 LAB — CMP14+EGFR
ALT: 28 IU/L (ref 0–32)
AST: 52 IU/L — ABNORMAL HIGH (ref 0–40)
Albumin/Globulin Ratio: 1 — ABNORMAL LOW (ref 1.2–2.2)
Albumin: 3.9 g/dL (ref 3.6–4.6)
Alkaline Phosphatase: 116 IU/L (ref 39–117)
BUN/Creatinine Ratio: 19 (ref 12–28)
BUN: 20 mg/dL (ref 8–27)
Bilirubin Total: 0.8 mg/dL (ref 0.0–1.2)
CO2: 22 mmol/L (ref 20–29)
Calcium: 9.4 mg/dL (ref 8.7–10.3)
Chloride: 103 mmol/L (ref 96–106)
Creatinine, Ser: 1.07 mg/dL — ABNORMAL HIGH (ref 0.57–1.00)
GFR calc Af Amer: 55 mL/min/{1.73_m2} — ABNORMAL LOW (ref >59)
GFR calc non Af Amer: 48 mL/min/{1.73_m2} — ABNORMAL LOW (ref >59)
Globulin, Total: 3.9 g/dL (ref 1.5–4.5)
Glucose: 92 mg/dL (ref 65–99)
Potassium: 3.6 mmol/L (ref 3.5–5.2)
Sodium: 143 mmol/L (ref 134–144)
Total Protein: 7.8 g/dL (ref 6.0–8.5)

## 2019-12-23 LAB — TSH: TSH: 1.98 u[IU]/mL (ref 0.450–4.500)

## 2020-02-02 ENCOUNTER — Telehealth: Payer: Self-pay | Admitting: Family Medicine

## 2020-02-02 NOTE — Chronic Care Management (AMB) (Signed)
  Chronic Care Management   Note  02/02/2020 Name: [name] MRN: [id number] DOB: 01/16/1933  Emily Wagner is a 86 y.o. year old female who is a primary care patient of [name], [name], MD. I reached out to Emily Wagner by phone today in response to a referral sent by Ms. [place] health plan.     [name] was given information about Chronic Care Management services today including:  1. CCM service includes personalized support from designated clinical staff supervised by her physician, including individualized plan of care and coordination with other care providers 2. 24/7 contact phone numbers for assistance for urgent and routine care needs. 3. Service will only be billed when office clinical staff spend 20 minutes or more in a month to coordinate care. 4. Only one practitioner may furnish and bill the service in a calendar month. 5. The patient may stop CCM services at any time (effective at the end of the month) by phone call to the office staff. 6. The patient will be responsible for cost sharing (co-pay) of up to 20% of the service fee (after annual deductible is met).  Patient did not agree to enrollment in care management services and does not wish to consider at this time.   Follow up plan: The care management team is available to follow up with the patient after provider conversation with the patient regarding recommendation for care management engagement and subsequent re-referral to the care management team.   [place], [place], [place] [address] Direct Dial: [email or web address]

## 2020-03-16 DIAGNOSIS — M7742 Metatarsalgia, left foot: Secondary | ICD-10-CM | POA: Diagnosis not present

## 2020-03-16 DIAGNOSIS — M7741 Metatarsalgia, right foot: Secondary | ICD-10-CM | POA: Diagnosis not present

## 2020-04-20 ENCOUNTER — Other Ambulatory Visit: Payer: Self-pay

## 2020-04-20 ENCOUNTER — Ambulatory Visit (INDEPENDENT_AMBULATORY_CARE_PROVIDER_SITE_OTHER): Payer: Medicare Other | Admitting: Family Medicine

## 2020-04-20 ENCOUNTER — Encounter: Payer: Self-pay | Admitting: Family Medicine

## 2020-04-20 VITALS — BP 147/89 | HR 96 | Temp 97.8°F | Ht 64.0 in | Wt 165.0 lb

## 2020-04-20 DIAGNOSIS — E039 Hypothyroidism, unspecified: Secondary | ICD-10-CM | POA: Diagnosis not present

## 2020-04-20 DIAGNOSIS — I480 Paroxysmal atrial fibrillation: Secondary | ICD-10-CM | POA: Diagnosis not present

## 2020-04-20 DIAGNOSIS — E7800 Pure hypercholesterolemia, unspecified: Secondary | ICD-10-CM | POA: Diagnosis not present

## 2020-04-20 DIAGNOSIS — I10 Essential (primary) hypertension: Secondary | ICD-10-CM | POA: Diagnosis not present

## 2020-04-20 DIAGNOSIS — I2510 Atherosclerotic heart disease of native coronary artery without angina pectoris: Secondary | ICD-10-CM | POA: Diagnosis not present

## 2020-04-20 DIAGNOSIS — F4323 Adjustment disorder with mixed anxiety and depressed mood: Secondary | ICD-10-CM

## 2020-04-20 MED ORDER — LORAZEPAM 0.5 MG PO TABS: 0.5000 mg | ORAL_TABLET | Freq: Two times a day (BID) | ORAL | 3 refills | Status: DC | PRN

## 2020-04-20 MED ORDER — CIPROFLOXACIN HCL 500 MG PO TABS
500.0000 mg | ORAL_TABLET | Freq: Two times a day (BID) | ORAL | 0 refills | Status: DC
Start: 2020-04-20 — End: 2020-08-23

## 2020-04-20 NOTE — Progress Notes (Signed)
BP (!) 147/89   Pulse 96   Temp 97.8 F (36.6 C) (Temporal)   Ht _0  (1.626 m)   Wt 165 lb (74.8 kg)   LMP 03/21/1971   BMI 28.32 kg/m    Subjective:   Patient ID: Emily Wagner, female    DOB: 1933/06/18, 86 y.o.   MRN: [id number]  HPI: Emily Wagner is a 86 y.o. female presenting on 04/20/2020 for Medical Management of Chronic Issues   HPI Hypertension and A. fib and CAD Patient is currently on amlodipine and hydrochlorothiazide, and their blood pressure today is 147/89. Patient denies any lightheadedness or dizziness. Patient denies headaches, blurred vision, chest pains, shortness of breath, or weakness. Denies any side effects from medication and is content with current medication.   Hyperlipidemia Patient is coming in for recheck of his hyperlipidemia. The patient is currently taking no medication currently. They deny any issues with myalgias or history of liver damage from it. They deny any focal numbness or weakness or chest pain.   Hypothyroidism recheck Patient is coming in for thyroid recheck today as well. They deny any issues with hair changes or heat or cold problems or diarrhea or constipation. They deny any chest pain or palpitations. They are currently on levothyroxine 50 micrograms   Anxiety Current rx-lorazepam 0.5 twice daily as needed # meds rx-60 Effectiveness of current meds-works well, having more anxiety since her daughter is gone Adverse reactions form meds-none currently  Pill count performed-No Last drug screen -07/31/2019 ( high risk q42m moderate risk q636mlow risk yearly ) Urine drug screen today- No [place]  If yes were their any concerning findings? -None  No flowsheet data found.   Controlled substance contract signed on: 07/31/2019  Relevant past medical, surgical, family and social history reviewed and updated as indicated. Interim medical history since our last visit reviewed. Allergies and medications  reviewed and updated.  Review of Systems  Constitutional: Negative for chills and fever.  Eyes: Negative for redness and visual disturbance.  Respiratory: Negative for chest tightness and shortness of breath.   Cardiovascular: Negative for chest pain and leg swelling.  Musculoskeletal: Negative for back pain and gait problem.  Skin: Negative for rash.  Neurological: Negative for light-headedness and headaches.  Psychiatric/Behavioral: Negative for agitation and behavioral problems.  All other systems reviewed and are negative.   Per HPI unless specifically indicated above   Allergies as of 04/20/2020      Reactions   Nitrofurantoin Other (See Comments)   Liver failure   Nsaids Other (See Comments)   Kidney failure   Other Other (See Comments)   Most antibiotics require supervision per patient   Flonase [fluticasone Propionate] Other (See Comments)   headache   Prevnar 13 [pneumococcal 13-val Conj Vacc] Other (See Comments)   Nerve damage in left arm   Beta Adrenergic Blockers    Nortriptyline Hcl Other (See Comments)   States she felt like worms where crawling under her skin.   Potassium-containing Compounds    Rash and raw areas / sore breast    Alendronate Sodium Other (See Comments)   Ciprofloxacin Other (See Comments)   Patient denies allergy--"I take Cipro for bladder infections"   Codeine Other (See Comments)   Crestor [rosuvastatin Calcium] Other (See Comments)   Darvon Other (See Comments)   Lipitor [atorvastatin Calcium] Other (See Comments)   Pravachol Other (See Comments)   Propranolol Hcl Other (See Comments)   Unknown  Sulfa Antibiotics Other (See Comments)   Sulfamethoxazole Other (See Comments)   Unknown   Ultram [tramadol Hcl] Itching   Welchol [colesevelam Hcl] Other (See Comments)   Zetia [ezetimibe] Other (See Comments)   Zocor [simvastatin] Other (See Comments)      Medication List       Accurate as of Apr 20, 2020 10:06 AM. If you have any  questions, ask your nurse or doctor.        amLODipine 5 MG tablet Commonly known as: NORVASC Take 1 tablet (5 mg total) by mouth daily. What changed: how much to take   aspirin EC 81 MG tablet Take 81 mg by mouth daily.   ciprofloxacin 500 MG tablet Commonly known as: Cipro Take 1 tablet (500 mg total) by mouth 2 (two) times daily. Started by: Worthy Rancher, MD   famotidine 20 MG tablet Commonly known as: Pepcid Take 1 tablet (20 mg total) by mouth 2 (two) times daily.   hydrochlorothiazide 25 MG tablet Commonly known as: HYDRODIURIL Take 1 tablet (25 mg total) by mouth daily.   levothyroxine 50 MCG tablet Commonly known as: SYNTHROID Take 1 tablet (50 mcg total) by mouth daily before breakfast.   LORazepam 0.5 MG tablet Commonly known as: ATIVAN Take 1 tablet (0.5 mg total) by mouth 2 (two) times daily as needed.   PRESERVISION AREDS 2 PO Take 1 capsule by mouth at bedtime.   VITAMIN B-12 PO Take 600 mcg by mouth every morning.   Vitamin D 50 MCG (2000 UT) Caps Take 2,000 Units by mouth daily.        Objective:   BP (!) 147/89   Pulse 96   Temp 97.8 F (36.6 C) (Temporal)   Ht '5\' 4"'$  (1.626 m)   Wt 165 lb (74.8 kg)   LMP 03/21/1971   BMI 28.32 kg/m   Wt Readings from Last 3 Encounters:  04/20/20 165 lb (74.8 kg)  12/22/19 166 lb (75.3 kg)  08/20/19 171 lb (77.6 kg)    Physical Exam Vitals and nursing note reviewed.  Constitutional:      General: She is not in acute distress.    Appearance: She is well-developed. She is not diaphoretic.  Eyes:     Conjunctiva/sclera: Conjunctivae normal.  Cardiovascular:     Rate and Rhythm: Normal rate and regular rhythm.     Heart sounds: Normal heart sounds. No murmur.  Pulmonary:     Effort: Pulmonary effort is normal. No respiratory distress.     Breath sounds: Normal breath sounds. No wheezing.  Musculoskeletal:        General: No tenderness. Normal range of motion.  Skin:    General: Skin is  warm and dry.     Findings: No rash.  Neurological:     Mental Status: She is alert and oriented to person, place, and time.     Coordination: Coordination normal.  Psychiatric:        Mood and Affect: Mood is anxious. Mood is not depressed.        Behavior: Behavior normal.        Thought Content: Thought content does not include suicidal ideation. Thought content does not include suicidal plan.       Assessment & Plan:   Problem List Items Addressed This Visit      Cardiovascular and Mediastinum   Essential hypertension   Relevant Orders   CMP14+EGFR   CAD (coronary artery disease)   Atrial fibrillation ([place])  Relevant Orders   TSH     Other   HYPERCHOLESTEROLEMIA - Primary   Adjustment disorder with mixed anxiety and depressed mood   Relevant Medications   LORazepam (ATIVAN) 0.5 MG tablet    Other Visit Diagnoses    Acquired hypothyroidism       Relevant Orders   TSH      Continue lorazepam, continue to monitor.  Will do strengthening exercises.  Allowing permissive hypertension and looks good. Follow up plan: Return in about 4 months (around 08/21/2020), or if symptoms worsen or fail to improve, for Recheck anxiety and hypertension.  Counseling provided for all of the vaccine components Orders Placed This Encounter  Procedures  . CMP14+EGFR  . TSH    Caryl Pina, MD [facility] 04/20/2020, 10:06 AM

## 2020-04-21 LAB — CMP14+EGFR
ALT: 12 IU/L (ref 0–32)
AST: 21 IU/L (ref 0–40)
Albumin/Globulin Ratio: 1.3 (ref 1.2–2.2)
Albumin: 4.3 g/dL (ref 3.6–4.6)
Alkaline Phosphatase: 102 IU/L (ref 48–121)
BUN/Creatinine Ratio: 22 (ref 12–28)
BUN: 24 mg/dL (ref 8–27)
Bilirubin Total: 0.7 mg/dL (ref 0.0–1.2)
CO2: 21 mmol/L (ref 20–29)
Calcium: 9.6 mg/dL (ref 8.7–10.3)
Chloride: 100 mmol/L (ref 96–106)
Creatinine, Ser: 1.1 mg/dL — ABNORMAL HIGH (ref 0.57–1.00)
GFR calc Af Amer: 53 mL/min/{1.73_m2} — ABNORMAL LOW (ref >59)
GFR calc non Af Amer: 46 mL/min/{1.73_m2} — ABNORMAL LOW (ref >59)
Globulin, Total: 3.3 g/dL (ref 1.5–4.5)
Glucose: 93 mg/dL (ref 65–99)
Potassium: 4 mmol/L (ref 3.5–5.2)
Sodium: 141 mmol/L (ref 134–144)
Total Protein: 7.6 g/dL (ref 6.0–8.5)

## 2020-04-21 LAB — TSH: TSH: 1.76 u[IU]/mL (ref 0.450–4.500)

## 2020-04-28 ENCOUNTER — Other Ambulatory Visit: Payer: Medicare Other

## 2020-04-28 ENCOUNTER — Other Ambulatory Visit: Payer: Self-pay

## 2020-04-28 ENCOUNTER — Telehealth: Payer: Self-pay | Admitting: Family Medicine

## 2020-04-28 DIAGNOSIS — W57XXXA Bitten or stung by nonvenomous insect and other nonvenomous arthropods, initial encounter: Secondary | ICD-10-CM

## 2020-04-28 DIAGNOSIS — S80262A Insect bite (nonvenomous), left knee, initial encounter: Secondary | ICD-10-CM | POA: Diagnosis not present

## 2020-04-28 DIAGNOSIS — Z7689 Persons encountering health services in other specified circumstances: Secondary | ICD-10-CM | POA: Diagnosis not present

## 2020-04-28 NOTE — Telephone Encounter (Signed)
Please advise 

## 2020-04-28 NOTE — Telephone Encounter (Signed)
Pt is here in office. Wants to know if Dr [name] can send order for her to have lab work done to make sure she doesn't have [place] fever/lyme disease. Has history of getting this and his very worried. Was bitten by 2 ticks recently. One on back of knee and other on hand.

## 2020-04-28 NOTE — Telephone Encounter (Signed)
Provider ordered blood work.

## 2020-04-28 NOTE — Telephone Encounter (Signed)
Patient wanted me to be tested for tickborne illnesses after having a tick bite a couple months ago and feeling ill since then,

## 2020-04-30 ENCOUNTER — Telehealth: Payer: Self-pay | Admitting: Family Medicine

## 2020-04-30 MED ORDER — DOXYCYCLINE HYCLATE 100 MG PO TABS: 100.0000 mg | ORAL_TABLET | Freq: Two times a day (BID) | ORAL | 0 refills | Status: DC

## 2020-04-30 NOTE — Telephone Encounter (Signed)
Pt would like a call from the nurse to discuss her lab results. She is wanting to know if she is positive or negative for [facility] spotted fever.

## 2020-04-30 NOTE — Telephone Encounter (Signed)
Lab results

## 2020-04-30 NOTE — Addendum Note (Signed)
Addended by: Caryl Pina on: 04/30/2020 03:59 PM   Modules accepted: Orders

## 2020-04-30 NOTE — Telephone Encounter (Signed)
Pt aware.

## 2020-04-30 NOTE — Telephone Encounter (Signed)
Please let patient know that it looks like she came back more positive for [facility] spotted fever, I sent in a new prescription of the doxycycline for 2 weeks that she can take.

## 2020-05-04 LAB — LYME, WESTERN BLOT, SERUM (REFLEXED)
IgG P18 Ab.: ABSENT
IgG P23 Ab.: ABSENT
IgG P28 Ab.: ABSENT
IgG P30 Ab.: ABSENT
IgG P39 Ab.: ABSENT
IgG P41 Ab.: ABSENT
IgG P45 Ab.: ABSENT
IgG P66 Ab.: ABSENT
IgG P93 Ab.: ABSENT
IgM P23 Ab.: ABSENT
IgM P41 Ab.: ABSENT
Lyme IgG Wb: NEGATIVE
Lyme IgM Wb: NEGATIVE

## 2020-05-04 LAB — CBC WITH DIFFERENTIAL/PLATELET
Basophils Absolute: 0.1 10*3/uL (ref 0.0–0.2)
Basos: 1 %
EOS (ABSOLUTE): 0.2 10*3/uL (ref 0.0–0.4)
Eos: 4 %
Hematocrit: 42.7 % (ref 34.0–46.6)
Hemoglobin: 14.6 g/dL (ref 11.1–15.9)
Immature Grans (Abs): 0 10*3/uL (ref 0.0–0.1)
Immature Granulocytes: 0 %
Lymphocytes Absolute: 1.5 10*3/uL (ref 0.7–3.1)
Lymphs: 25 %
MCH: 31.4 pg (ref 26.6–33.0)
MCHC: 34.2 g/dL (ref 31.5–35.7)
MCV: 92 fL (ref 79–97)
Monocytes Absolute: 0.6 10*3/uL (ref 0.1–0.9)
Monocytes: 10 %
Neutrophils Absolute: 3.6 10*3/uL (ref 1.4–7.0)
Neutrophils: 60 %
Platelets: 229 10*3/uL (ref 150–450)
RBC: 4.65 x10E6/uL (ref 3.77–5.28)
RDW: 12.8 % (ref 11.7–15.4)
WBC: 6 10*3/uL (ref 3.4–10.8)

## 2020-05-04 LAB — ALPHA-GAL PANEL
Alpha Gal IgE*: <0.1 kU/L (ref <0.10)
Beef (Bos spp) IgE: <0.1 kU/L (ref <0.35)
Class Interpretation: 0
Class Interpretation: 0
Class Interpretation: 0
Lamb/Mutton (Ovis spp) IgE: <0.1 kU/L (ref <0.35)
Pork (Sus spp) IgE: <0.1 kU/L (ref <0.35)

## 2020-05-04 LAB — LYME AB/WESTERN BLOT REFLEX
LYME DISEASE AB, QUANT, IGM: 0.99 index — ABNORMAL HIGH (ref 0.00–0.79)
Lyme IgG/IgM Ab: <0.91 {ISR} (ref 0.00–0.90)

## 2020-05-04 LAB — ROCKY MTN SPOTTED FVR ABS PNL(IGG+IGM)
RMSF IgG: NEGATIVE
RMSF IgM: 3.84 index — ABNORMAL HIGH (ref 0.00–0.89)

## 2020-05-17 ENCOUNTER — Telehealth: Payer: Self-pay | Admitting: Family Medicine

## 2020-05-17 NOTE — Telephone Encounter (Signed)
We are notified.

## 2020-05-21 ENCOUNTER — Telehealth: Payer: Self-pay | Admitting: Family Medicine

## 2020-05-21 NOTE — Telephone Encounter (Signed)
Is recheck necessary or does symptoms continue on for a while?

## 2020-05-21 NOTE — Telephone Encounter (Signed)
Pt called stating that she finished taking the medicine that was prescribed to her for [place] fever but says she still feels real weak and would like order put in to have her blood rechecked.

## 2020-05-24 NOTE — Telephone Encounter (Signed)
We do not necessarily need to recheck the blood work, commonly fatigue will stay for some time and the blood work is not going to change as she is going to show that she still had it.  If she is feeling fatigued and think that is because of another reason the she could come in and we could see her for this but if it is still fatigued related to recovery from the illness come back.

## 2020-05-24 NOTE — Telephone Encounter (Signed)
Provider's information  given to patient.

## 2020-06-17 DIAGNOSIS — M79676 Pain in unspecified toe(s): Secondary | ICD-10-CM | POA: Diagnosis not present

## 2020-06-17 DIAGNOSIS — B351 Tinea unguium: Secondary | ICD-10-CM | POA: Diagnosis not present

## 2020-06-29 ENCOUNTER — Ambulatory Visit: Payer: Self-pay

## 2020-06-29 ENCOUNTER — Encounter: Payer: Self-pay | Admitting: Orthopaedic Surgery

## 2020-06-29 ENCOUNTER — Ambulatory Visit: Payer: Medicare Other | Admitting: Orthopaedic Surgery

## 2020-06-29 DIAGNOSIS — M545 Low back pain: Secondary | ICD-10-CM

## 2020-06-29 DIAGNOSIS — G8929 Other chronic pain: Secondary | ICD-10-CM

## 2020-06-29 MED ORDER — TIZANIDINE HCL 4 MG PO TABS
2.0000 mg | ORAL_TABLET | Freq: Three times a day (TID) | ORAL | 0 refills | Status: DC | PRN
Start: 2020-06-29 — End: 2021-09-29

## 2020-06-29 MED ORDER — METHYLPREDNISOLONE 4 MG PO TABS
ORAL_TABLET | ORAL | 0 refills | Status: DC
Start: 2020-06-29 — End: 2020-08-25

## 2020-06-29 NOTE — Progress Notes (Signed)
Office Visit Note   Patient: Teriyah Purington           Date of Birth: 24-Aug-1933           MRN: [id number] Visit Date: 06/29/2020              Requested by: [name], [name], MD [place],  [place] [address] PCP: [name], [name], MD   Assessment & Plan: Visit Diagnoses:  1. Chronic bilateral low back pain, unspecified whether sciatica present     Plan: To try to calm down the acute nature of her pain I am going to try a 6-day steroid taper as well as some Zanaflex that I want her to cut in half and try as needed.  I would like to also have her get a consultation with Dr. Ernestina Patches for bilateral L5-S1 facet joint injections because this seems to be where she is most symptomatic and hopefully this can be done as an intervention will help decrease her pain and improve her quality of life.  She agrees with this treatment plan.  I will see her back myself in 4 weeks and hopefully by then she already had an intervention.  All questions and concerns were answered and addressed.  Follow-Up Instructions: Return in about 4 weeks (around 07/27/2020).   Orders:  Orders Placed This Encounter  Procedures  . XR Lumbar Spine Complete   Meds ordered this encounter  Medications  . methylPREDNISolone (MEDROL) 4 MG tablet    Sig: Medrol dose pack. Take as instructed    Dispense:  21 tablet    Refill:  0  . tiZANidine (ZANAFLEX) 4 MG tablet    Sig: Take 0.5 tablets (2 mg total) by mouth every 8 (eight) hours as needed for muscle spasms.    Dispense:  30 tablet    Refill:  0      Procedures: No procedures performed   Clinical Data: No additional findings.   Subjective: Chief Complaint  Patient presents with  . Lower Back - Pain  The patient is a very active and pleasant 86 year old the I am seeing for the first time.  She does ambulate with a cane.  She comes in with low back pain just across the lowest aspect of her lumbar spine on both sides that is becoming severe for  her when she stands for long periods at times or sits for long period of time.  She denies any change in bowel or bladder function.  She is not a diabetic.  She cannot take NSAIDs due to significant kidney disease.  She also prefers to avoid narcotics because healthy make her feel.  She is on a fixed income so her physical therapy cost $30 at a time and she cannot afford that anymore.  She is hoping to get some type of intervention her back that will help decrease the pain in her spine.  She denies any weakness in her legs.  She does use a cane to ambulate.  HPI  Review of Systems  She currently denies any headache, chest pain, shortness of breath, fever, chills, nausea, vomiting Objective: Vital Signs: LMP 03/21/1971   Physical Exam She is alert and orient x3 and in no acute distress Ortho Exam Examination of her lower extremities shows good strength in both lower extremities with normal sensation.  She has pain in the lower aspect of palpation of the lumbar spine across the midline on both sides but it does not radiate  into the sciatic area on either side.  She has good rotation of both hips.  The pain seems to be facet joint mediated from my standpoint. Specialty Comments:  No specialty comments available.  Imaging: XR Lumbar Spine Complete  Result Date: 06/29/2020 2 views of the lumbar spine show severe degenerative scoliosis and severe degenerative disc disease throughout every level.  There is also arthritis of the posterior elements of the lower lumbar spine.    PMFS History: Patient Active Problem List   Diagnosis Date Noted  . Aortic atherosclerosis ([facility]) 12/12/2017  . Atrial fibrillation ([facility]) 11/04/2015  . 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 Unstable angina ([facility]) 10/29/2015  . CAD (coronary artery disease) 10/29/2015  . Benign hypertensive heart disease without heart failure   . Interstitial cystitis   . Allergy to multiple medications 07/15/2013  . S/P hip  hemiarthroplasty 11/07/2012  . Closed intertrochanteric fracture of right femur ([facility]) 10/19/2012  . Renal insufficiency   . Adjustment disorder with mixed anxiety and depressed mood   . Osteoporosis   . ASCVD (arteriosclerotic cardiovascular disease)   . 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  . HYPERCHOLESTEROLEMIA 05/02/2008  . Essential hypertension 05/02/2008  . DIVERTICULOSIS OF COLON 08/27/2003   Past Medical History:  Diagnosis Date  . Adjustment disorder with mixed anxiety and depressed mood   . Atrial fibrillation ([facility]) 11/04/2015   Post-operative  . Benign hypertensive heart disease without heart failure   . CAD (coronary artery disease)   . Infection of urinary tract 10/31/2015  . Interstitial cystitis   . Osteopenia   . Other and unspecified hyperlipidemia   . Other dyspnea and respiratory abnormality   . Pelvic fracture ([facility]) 05/30/2015  . 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   LIMA to LAD    Family History  Problem Relation Age of Onset  . CAD Mother 68       Died suddenly  . CAD Father 93       Died suddenly  . CAD Daughter 63  . Diabetes type I Daughter        Since age 100  . Heart disease Sister   . Osteoporosis Sister     Past Surgical History:  Procedure Laterality Date  . ABDOMINAL HYSTERECTOMY     Cancer and endometriosis  . CARDIAC CATHETERIZATION  2008  . CARDIAC CATHETERIZATION N/A 10/29/2015   Procedure: Left Heart Cath and Coronary Angiography;  Surgeon: Belva Crome, MD;  Location: [facility];  Service: Cardiovascular;  Laterality: N/A;  . CATARACT EXTRACTION    . CHOLECYSTECTOMY    . CORONARY ARTERY BYPASS GRAFT N/A 11/02/2015   Procedure: OFF PUMP CORONARY ARTERY BYPASS GRAFTING (CABG) TIMES ONE;  Surgeon: Rexene Alberts, MD;  Location: [facility];  Service: Open Heart Surgery;  Laterality: N/A;  LIMA to LAD  . FEMUR FRACTURE SURGERY  09/2015   right hip compression screws   . FRACTURE SURGERY     Right hip  . HERNIA REPAIR     Right inguinal  .  TEE WITHOUT CARDIOVERSION N/A 11/02/2015   Procedure: TRANSESOPHAGEAL ECHOCARDIOGRAM (TEE);  Surgeon: Rexene Alberts, MD;  Location: [facility];  Service: Open Heart Surgery;  Laterality: N/A;   Social History   Occupational History  . Occupation: retired    Fish farm manager: [employer]    Comment: admin. assistant  Tobacco Use  . Smoking status: Never Smoker  . Smokeless tobacco: Never Used  Vaping Use  . Vaping Use: Never used  Substance and Sexual Activity  . Alcohol use: No  . Drug use: No  . Sexual activity: Not on file

## 2020-07-01 ENCOUNTER — Telehealth: Payer: Self-pay | Admitting: Orthopedic Surgery

## 2020-07-01 ENCOUNTER — Other Ambulatory Visit: Payer: Self-pay

## 2020-07-01 DIAGNOSIS — G8929 Other chronic pain: Secondary | ICD-10-CM

## 2020-07-01 NOTE — Telephone Encounter (Signed)
Emily Wagner called in to give Dr. Ninfa Linden an update on the medications he gave her at her last appointment.  She states that the prednisone is helping her pain some. But, the muscle relaxant "puts her out" and "sends her to [place]".  She will not be taking it anymore.  She reports that her BP dropped to around 99/55 and her throat became dry and swollen.  She is ok now, but just wanted to make Dr. Ninfa Linden aware.  She can be reached at [phone].

## 2020-07-19 ENCOUNTER — Other Ambulatory Visit: Payer: Self-pay

## 2020-07-19 ENCOUNTER — Encounter: Payer: Self-pay | Admitting: Physical Medicine and Rehabilitation

## 2020-07-19 ENCOUNTER — Ambulatory Visit: Payer: Self-pay

## 2020-07-19 ENCOUNTER — Ambulatory Visit: Payer: Medicare Other | Admitting: Physical Medicine and Rehabilitation

## 2020-07-19 VITALS — BP 152/95 | HR 96

## 2020-07-19 DIAGNOSIS — M47816 Spondylosis without myelopathy or radiculopathy, lumbar region: Secondary | ICD-10-CM | POA: Diagnosis not present

## 2020-07-19 MED ORDER — METHYLPREDNISOLONE ACETATE 80 MG/ML IJ SUSP
80.0000 mg | Freq: Once | INTRAMUSCULAR | Status: AC
Start: 2020-07-19 — End: 2020-07-19
  Administered 2020-07-19: 80 mg

## 2020-07-19 NOTE — Progress Notes (Signed)
Emily Wagner - 86 y.o. female MRN [id number]  Date of birth: Apr 08, 1933  Office Visit Note: Visit Date: 07/19/2020 PCP: Wagner, Emily Kaufmann, MD Referred by: Wagner, Emily Kaufmann, MD  Subjective: Chief Complaint  Patient presents with  . Lower Back - Pain   HPI:  Emily Wagner is a 86 y.o. female who comes in today at the request of Dr. Jean Wagner for planned Bilateral L5-S1 Lumbar facet/medial branch block with fluoroscopic guidance.  The patient has failed conservative care including home exercise, medications, time and activity modification.  This injection will be diagnostic and hopefully therapeutic.  Please see requesting physician notes for further details and justification.    Recent lumbar x-ray show lumbar spondylosis at L4-5 L5-S1 with some scoliosis.  Old MRI was found from 2003 or 2005 showing listhesis of L4 on L5 but no high-grade stenosis at the time.  She has had no prior back surgery.  She does have multiple medical issues with a host of drug intolerances and allergies.  If she does not get much relief with injection then I would think MRI of the lumbar spine or pelvis would be the next step.  Could entertain the idea of sacral insufficiency fracture.  ROS Otherwise per HPI.  Assessment & Plan: Visit Diagnoses:  1. Spondylosis without myelopathy or radiculopathy, lumbar region     Plan: No additional findings.   Meds & Orders:  Meds ordered this encounter  Medications  . methylPREDNISolone acetate (DEPO-MEDROL) injection 80 mg    Orders Placed This Encounter  Procedures  . Facet Injection  . XR C-ARM NO REPORT    Follow-up: Return if symptoms worsen or fail to improve.   Procedures: No procedures performed  Lumbar Facet Joint Intra-Articular Injection(s) with Fluoroscopic Guidance  Patient: Emily Wagner      Date of Birth: [birth year] MRN: [id number] PCP: Wagner, Emily Kaufmann, MD      Visit Date: 07/19/2020   Universal Protocol:      Date/Time: 07/19/2020  Consent Given By: the patient  Position: PRONE   Additional Comments: Vital signs were monitored before and after the procedure. Patient was prepped and draped in the usual sterile fashion. The correct patient, procedure, and site was verified.   Injection Procedure Details:  Procedure Site One Meds Administered:  Meds ordered this encounter  Medications  . methylPREDNISolone acetate (DEPO-MEDROL) injection 80 mg     Laterality: Bilateral  Location/Site:  L5-S1  Needle size: 22 guage  Needle type: Spinal  Needle Placement: Articular  Findings:  -Comments: Excellent flow of contrast producing a partial arthrogram.  Procedure Details: The fluoroscope beam is vertically oriented in AP, and the inferior recess is visualized beneath the lower pole of the inferior apophyseal process, which represents the target point for needle insertion. When direct visualization is difficult the target point is located at the medial projection of the vertebral pedicle. The region overlying each aforementioned target is locally anesthetized with a 1 to 2 ml. volume of 1% Lidocaine without Epinephrine.   The spinal needle was inserted into each of the above mentioned facet joints using biplanar fluoroscopic guidance. A 0.25 to 0.5 ml. volume of Isovue-250 was injected and a partial facet joint arthrogram was obtained. A single spot film was obtained of the resulting arthrogram.    One to 1.25 ml of the steroid/anesthetic solution was then injected into each of the facet joints noted above.   Additional Comments:  The patient tolerated the  procedure well Dressing: 2 x 2 sterile gauze and Band-Aid    Post-procedure details: Patient was observed during the procedure. Post-procedure instructions were reviewed.  Patient left the clinic in stable condition.     Clinical History: 06/29/20 Lumbar xray  2 views of the lumbar spine show severe degenerative scoliosis and  severe  degenerative disc disease throughout every level. There is also arthritis  of the posterior elements of the lower lumbar spine.     Objective:  VS:  HT:    WT:   BMI:     BP:(!) 152/95  HR:96bpm  TEMP: ( )  RESP:  Physical Exam Constitutional:      General: She is not in acute distress.    Appearance: Normal appearance. She is not ill-appearing.  HENT:     Head: Normocephalic and atraumatic.     Right Ear: External ear normal.     Left Ear: External ear normal.  Eyes:     Extraocular Movements: Extraocular movements intact.  Cardiovascular:     Rate and Rhythm: Normal rate.     Pulses: Normal pulses.  Musculoskeletal:     Right lower leg: No edema.     Left lower leg: No edema.     Comments: Patient has good distal strength with no pain over the greater trochanters.  No clonus or focal weakness.  Ambulates with a cane.  She has pain with facet loading and extension.  She does have pain over the PSIS bilaterally.  Skin:    Findings: No erythema, lesion or rash.  Neurological:     General: No focal deficit present.     Mental Status: She is alert and oriented to person, place, and time.     Sensory: No sensory deficit.     Motor: No weakness or abnormal muscle tone.     Coordination: Coordination normal.  Psychiatric:        Mood and Affect: Mood normal.        Behavior: Behavior normal.      Imaging: XR C-ARM NO REPORT  Result Date: 07/19/2020 Please see Notes tab for imaging impression.

## 2020-07-19 NOTE — Procedures (Signed)
Lumbar Facet Joint Intra-Articular Injection(s) with Fluoroscopic Guidance  Patient: Emily Wagner      Date of Birth: [birth year] MRN: [id number] PCP: [name], [name], MD      Visit Date: 07/19/2020   Universal Protocol:    Date/Time: 07/19/2020  Consent Given By: the patient  Position: PRONE   Additional Comments: Vital signs were monitored before and after the procedure. Patient was prepped and draped in the usual sterile fashion. The correct patient, procedure, and site was verified.   Injection Procedure Details:  Procedure Site One Meds Administered:  Meds ordered this encounter  Medications  . methylPREDNISolone acetate (DEPO-MEDROL) injection 80 mg     Laterality: Bilateral  Location/Site:  L5-S1  Needle size: 22 guage  Needle type: Spinal  Needle Placement: Articular  Findings:  -Comments: Excellent flow of contrast producing a partial arthrogram.  Procedure Details: The fluoroscope beam is vertically oriented in AP, and the inferior recess is visualized beneath the lower pole of the inferior apophyseal process, which represents the target point for needle insertion. When direct visualization is difficult the target point is located at the medial projection of the vertebral pedicle. The region overlying each aforementioned target is locally anesthetized with a 1 to 2 ml. volume of 1% Lidocaine without Epinephrine.   The spinal needle was inserted into each of the above mentioned facet joints using biplanar fluoroscopic guidance. A 0.25 to 0.5 ml. volume of Isovue-250 was injected and a partial facet joint arthrogram was obtained. A single spot film was obtained of the resulting arthrogram.    One to 1.25 ml of the steroid/anesthetic solution was then injected into each of the facet joints noted above.   Additional Comments:  The patient tolerated the procedure well Dressing: 2 x 2 sterile gauze and Band-Aid    Post-procedure details: Patient was  observed during the procedure. Post-procedure instructions were reviewed.  Patient left the clinic in stable condition.

## 2020-07-19 NOTE — Progress Notes (Signed)
Pt state lower back pain. Pt state sitting and standing makes the pain worse. Pt state laying down helps with sum of the pain.  Numeric Pain Rating Scale and Functional Assessment Average Pain 8   In the last MONTH (on 0-10 scale) has pain interfered with the following?  1. General activity like being  able to carry out your everyday physical activities such as walking, climbing stairs, carrying groceries, or moving a chair?  Rating(10)   +Driver, -BT, -Dye Allergies.

## 2020-07-27 ENCOUNTER — Ambulatory Visit: Payer: Medicare Other | Admitting: Orthopaedic Surgery

## 2020-07-27 ENCOUNTER — Encounter: Payer: Self-pay | Admitting: Orthopaedic Surgery

## 2020-07-27 DIAGNOSIS — M47816 Spondylosis without myelopathy or radiculopathy, lumbar region: Secondary | ICD-10-CM

## 2020-07-27 NOTE — Progress Notes (Signed)
The patient is a very pleasant 86 year old who is about a week out from bilateral L5/S1 facet joint injections by Dr. Ernestina Patches.  She says she is 50 to 60% better and would have done better if she did not try to lift something heavy recently a few days ago and has had some sharp right-sided back pain.  Overall though she is pleased and would like to be considered for a repeat injection in the near future.  She denies any radicular symptoms going down her legs.  On exam she has good strength in about lower extremities and pain that is pinpoint to the right side of her lumbar spine at the lower aspect.  I did talk with Dr. Ernestina Patches and he states that he would consider repeat facet injection in his littlest 2 weeks.  She would like to try that again so we will set her up an appointment to have facet joint injections again.  This made the only needed on the right side.  All questions and concerns were answered and addressed.

## 2020-07-28 ENCOUNTER — Other Ambulatory Visit: Payer: Self-pay

## 2020-07-28 DIAGNOSIS — M47816 Spondylosis without myelopathy or radiculopathy, lumbar region: Secondary | ICD-10-CM

## 2020-07-28 DIAGNOSIS — G8929 Other chronic pain: Secondary | ICD-10-CM

## 2020-07-29 ENCOUNTER — Other Ambulatory Visit: Payer: Self-pay | Admitting: Family Medicine

## 2020-07-29 ENCOUNTER — Other Ambulatory Visit: Payer: Self-pay | Admitting: Cardiology

## 2020-07-29 DIAGNOSIS — N3010 Interstitial cystitis (chronic) without hematuria: Secondary | ICD-10-CM

## 2020-07-29 DIAGNOSIS — I10 Essential (primary) hypertension: Secondary | ICD-10-CM

## 2020-08-16 ENCOUNTER — Ambulatory Visit: Payer: Medicare Other | Admitting: Physical Medicine and Rehabilitation

## 2020-08-16 ENCOUNTER — Other Ambulatory Visit: Payer: Self-pay

## 2020-08-16 ENCOUNTER — Encounter: Payer: Self-pay | Admitting: Physical Medicine and Rehabilitation

## 2020-08-16 ENCOUNTER — Ambulatory Visit: Payer: Self-pay

## 2020-08-16 VITALS — BP 158/100 | HR 95

## 2020-08-16 DIAGNOSIS — M47816 Spondylosis without myelopathy or radiculopathy, lumbar region: Secondary | ICD-10-CM

## 2020-08-16 MED ORDER — METHYLPREDNISOLONE ACETATE 80 MG/ML IJ SUSP
40.0000 mg | Freq: Once | INTRAMUSCULAR | Status: AC
  Administered 2020-08-16: 40 mg

## 2020-08-16 NOTE — Progress Notes (Signed)
Pt states lower back pain. Pt state standing on her feet and walking makes the pain worse. Pt state laying down or sitting helps ease the pain. Pt has hx of inj on 07/19/20 pt state it was great but it lasted for two weeks.  Numeric Pain Rating Scale and Functional Assessment Average Pain 8   In the last MONTH (on 0-10 scale) has pain interfered with the following?  1. General activity like being  able to carry out your everyday physical activities such as walking, climbing stairs, carrying groceries, or moving a chair?  Rating(7)   +Driver, -BT, -Dye Allergies.

## 2020-08-22 NOTE — Progress Notes (Signed)
Emily Wagner - 86 y.o. female MRN [id number]  Date of birth: 1933-07-11  Office Visit Note: Visit Date: 08/16/2020 PCP: [name], [name], MD Referred by: [name], [name], MD  Subjective: No chief complaint on file.  HPI:  Emily Wagner is a 86 y.o. female who comes in today for planned repeat Bilateral L5-S1 Lumbar facet/medial branch block with fluoroscopic guidance.  The patient has failed conservative care including home exercise, medications, time and activity modification.  This injection will be diagnostic and hopefully therapeutic.  Please see requesting physician notes for further details and justification.  Exam shows concordant low back pain with facet joint loading and extension. Patient received more than 80% pain relief from prior injection.  Referring:Dr. Jean Rosenthal  Prior injection did offer more than 80% relief as noted above and did give the patient more functional ability for activities of daily living and was also beneficial in that it did reduce her medication requirement.  Procedures are done as part of a comprehensive orthopedic and pain management program.   ROS Otherwise per HPI.  Assessment & Plan: Visit Diagnoses:  1. Spondylosis without myelopathy or radiculopathy, lumbar region     Plan: No additional findings.   Meds & Orders:  Meds ordered this encounter  Medications  . methylPREDNISolone acetate (DEPO-MEDROL) injection 40 mg    Orders Placed This Encounter  Procedures  . Facet Injection  . XR C-ARM NO REPORT    Follow-up: Return if symptoms worsen or fail to improve, for Consider Radiofrequency ablation.   Procedures: No procedures performed  Lumbar Diagnostic Facet Joint Nerve Block with Fluoroscopic Guidance   Patient: Emily Wagner      Date of Birth: 07/01/1933 MRN: [id number] PCP: [name], [name], MD      Visit Date: 08/16/2020   Universal Protocol:    Date/Time: 09/26/216:40 PM  Consent Given By:  the patient  Position: PRONE  Additional Comments: Vital signs were monitored before and after the procedure. Patient was prepped and draped in the usual sterile fashion. The correct patient, procedure, and site was verified.   Injection Procedure Details:  Procedure Site One Meds Administered:  Meds ordered this encounter  Medications  . methylPREDNISolone acetate (DEPO-MEDROL) injection 40 mg     Laterality: Bilateral  Location/Site:  L5-S1  Needle size: 22 ga.  Needle type:spinal  Needle Placement: Oblique pedical  Findings:   -Comments: There was excellent flow of contrast along the articular pillars without intravascular flow.  Procedure Details: The fluoroscope beam is vertically oriented in AP and then obliqued 15 to 20 degrees to the ipsilateral side of the desired nerve to achieve the "Scotty dog" appearance.  The skin over the target area of the junction of the superior articulating process and the transverse process (sacral ala if blocking the L5 dorsal rami) was locally anesthetized with a 1 ml volume of 1% Lidocaine without Epinephrine.  The spinal needle was inserted and advanced in a trajectory view down to the target.   After contact with periosteum and negative aspirate for blood and CSF, correct placement without intravascular or epidural spread was confirmed by injecting 0.5 ml. of Isovue-250.  A spot radiograph was obtained of this image.    Next, a 0.5 ml. volume of the injectate described above was injected. The needle was then redirected to the other facet joint nerves mentioned above if needed.  Prior to the procedure, the patient was given a Pain Diary which was completed  for baseline measurements.  After the procedure, the patient rated their pain every 30 minutes and will continue rating at this frequency for a total of 5 hours.  The patient has been asked to complete the Diary and return to [name] by mail, fax or hand delivered as soon as  possible.   Additional Comments:  The patient tolerated the procedure well Dressing: 2 x 2 sterile gauze and Band-Aid    Post-procedure details: Patient was observed during the procedure. Post-procedure instructions were reviewed.  Patient left the clinic in stable condition.     Clinical History: 06/29/20 Lumbar xray  2 views of the lumbar spine show severe degenerative scoliosis and severe  degenerative disc disease throughout every level. There is also arthritis  of the posterior elements of the lower lumbar spine.     Objective:  VS:  HT:    WT:   BMI:     BP:(!) 158/100  HR:95bpm  TEMP: ( )  RESP:  Physical Exam Constitutional:      General: She is not in acute distress.    Appearance: Normal appearance. She is not ill-appearing.  HENT:     Head: Normocephalic and atraumatic.     Right Ear: External ear normal.     Left Ear: External ear normal.  Eyes:     Extraocular Movements: Extraocular movements intact.  Cardiovascular:     Rate and Rhythm: Normal rate.     Pulses: Normal pulses.  Musculoskeletal:     Right lower leg: No edema.     Left lower leg: No edema.     Comments: Patient has good distal strength with no pain over the greater trochanters.  No clonus or focal weakness.Patient somewhat slow to rise from a seated position to full extension.  There is concordant low back pain with facet loading and lumbar spine extension rotation.  There are no definitive trigger points but the patient is somewhat tender across the lower back and PSIS.  There is no pain with hip rotation.   Skin:    Findings: No erythema, lesion or rash.  Neurological:     General: No focal deficit present.     Mental Status: She is alert and oriented to person, place, and time.     Sensory: No sensory deficit.     Motor: No weakness or abnormal muscle tone.     Coordination: Coordination normal.  Psychiatric:        Mood and Affect: Mood normal.        Behavior: Behavior  normal.      Imaging: No results found.

## 2020-08-22 NOTE — Procedures (Signed)
Lumbar Diagnostic Facet Joint Nerve Block with Fluoroscopic Guidance   Patient: Emily Wagner      Date of Birth: 01/15/1933 MRN: [id number] PCP: [name], [name], MD      Visit Date: 08/16/2020   Universal Protocol:    Date/Time: 09/26/216:40 PM  Consent Given By: the patient  Position: PRONE  Additional Comments: Vital signs were monitored before and after the procedure. Patient was prepped and draped in the usual sterile fashion. The correct patient, procedure, and site was verified.   Injection Procedure Details:  Procedure Site One Meds Administered:  Meds ordered this encounter  Medications  . methylPREDNISolone acetate (DEPO-MEDROL) injection 40 mg     Laterality: Bilateral  Location/Site:  L5-S1  Needle size: 22 ga.  Needle type:spinal  Needle Placement: Oblique pedical  Findings:   -Comments: There was excellent flow of contrast along the articular pillars without intravascular flow.  Procedure Details: The fluoroscope beam is vertically oriented in AP and then obliqued 15 to 20 degrees to the ipsilateral side of the desired nerve to achieve the "Scotty dog" appearance.  The skin over the target area of the junction of the superior articulating process and the transverse process (sacral ala if blocking the L5 dorsal rami) was locally anesthetized with a 1 ml volume of 1% Lidocaine without Epinephrine.  The spinal needle was inserted and advanced in a trajectory view down to the target.   After contact with periosteum and negative aspirate for blood and CSF, correct placement without intravascular or epidural spread was confirmed by injecting 0.5 ml. of Isovue-250.  A spot radiograph was obtained of this image.    Next, a 0.5 ml. volume of the injectate described above was injected. The needle was then redirected to the other facet joint nerves mentioned above if needed.  Prior to the procedure, the patient was given a Pain Diary which was completed for  baseline measurements.  After the procedure, the patient rated their pain every 30 minutes and will continue rating at this frequency for a total of 5 hours.  The patient has been asked to complete the Diary and return to [name] by mail, fax or hand delivered as soon as possible.   Additional Comments:  The patient tolerated the procedure well Dressing: 2 x 2 sterile gauze and Band-Aid    Post-procedure details: Patient was observed during the procedure. Post-procedure instructions were reviewed.  Patient left the clinic in stable condition.

## 2020-08-23 ENCOUNTER — Other Ambulatory Visit: Payer: Self-pay

## 2020-08-23 ENCOUNTER — Encounter: Payer: Self-pay | Admitting: Family Medicine

## 2020-08-23 ENCOUNTER — Ambulatory Visit (INDEPENDENT_AMBULATORY_CARE_PROVIDER_SITE_OTHER): Payer: Medicare Other | Admitting: Family Medicine

## 2020-08-23 VITALS — BP 156/83 | HR 79 | Temp 98.0°F | Ht 64.0 in | Wt 162.0 lb

## 2020-08-23 DIAGNOSIS — F4323 Adjustment disorder with mixed anxiety and depressed mood: Secondary | ICD-10-CM

## 2020-08-23 DIAGNOSIS — I10 Essential (primary) hypertension: Secondary | ICD-10-CM | POA: Diagnosis not present

## 2020-08-23 DIAGNOSIS — Z79891 Long term (current) use of opiate analgesic: Secondary | ICD-10-CM | POA: Diagnosis not present

## 2020-08-23 DIAGNOSIS — E7800 Pure hypercholesterolemia, unspecified: Secondary | ICD-10-CM

## 2020-08-23 DIAGNOSIS — Z23 Encounter for immunization: Secondary | ICD-10-CM | POA: Diagnosis not present

## 2020-08-23 DIAGNOSIS — E039 Hypothyroidism, unspecified: Secondary | ICD-10-CM

## 2020-08-23 DIAGNOSIS — G4700 Insomnia, unspecified: Secondary | ICD-10-CM

## 2020-08-23 DIAGNOSIS — N3010 Interstitial cystitis (chronic) without hematuria: Secondary | ICD-10-CM

## 2020-08-23 MED ORDER — FAMOTIDINE 20 MG PO TABS: 20.0000 mg | ORAL_TABLET | Freq: Two times a day (BID) | ORAL | 3 refills | Status: DC

## 2020-08-23 MED ORDER — LEVOTHYROXINE SODIUM 50 MCG PO TABS: 50.0000 ug | ORAL_TABLET | Freq: Every day | ORAL | 3 refills | Status: DC

## 2020-08-23 MED ORDER — DOXYCYCLINE HYCLATE 100 MG PO TABS
100.0000 mg | ORAL_TABLET | Freq: Two times a day (BID) | ORAL | 3 refills | Status: DC
Start: 2020-08-23 — End: 2020-08-25

## 2020-08-23 MED ORDER — CIPROFLOXACIN HCL 500 MG PO TABS
500.0000 mg | ORAL_TABLET | Freq: Two times a day (BID) | ORAL | 3 refills | Status: DC
Start: 2020-08-23 — End: 2020-08-25

## 2020-08-23 MED ORDER — LORAZEPAM 0.5 MG PO TABS: 0.5000 mg | ORAL_TABLET | Freq: Two times a day (BID) | ORAL | 3 refills | Status: DC | PRN

## 2020-08-23 NOTE — Progress Notes (Signed)
BP (!) 156/83   Pulse 79   Temp 98 F (36.7 C)   Ht _0  (1.626 m)   Wt 162 lb (73.5 kg)   LMP 03/21/1971   SpO2 93%   BMI 27.81 kg/m    Subjective:   Patient ID: Emily Wagner, female    DOB: Jan 16, 1933, 86 y.o.   MRN: [id number]  HPI: Abella Shugart is a 86 y.o. female presenting on 08/23/2020 for Medical Management of Chronic Issues (med refill) and Insomnia   HPI Hypertension  Patient is currently on amlodipine and hydrochlorothiazide, and their blood pressure today is 156/83. Patient denies any lightheadedness or dizziness. Patient denies headaches, blurred vision, chest pains, shortness of breath, or weakness. Denies any side effects from medication and is content with current medication.   Hyperlipidemia Patient is coming in for recheck of his hyperlipidemia. The patient is currently taking no medication currently monitoring, has been diet controlled, due to muscle issues does not want to statin. They deny any issues with myalgias or history of liver damage from it. They deny any focal numbness or weakness or chest pain.   Hypothyroidism recheck Patient is coming in for thyroid recheck today as well. They deny any issues with hair changes or heat or cold problems or diarrhea or constipation. They deny any chest pain or palpitations. They are currently on levothyroxine 50 micrograms   Insomnia and anxiety Patient is coming in for insomnia and anxiety recheck. Current rx-lorazepam 0.5 mg twice daily as needed # meds rx-60 Effectiveness of current meds-works well, no major side effects. Adverse reactions form meds-none  Pill count performed-No Last drug screen -08/27/2019 ( high risk q11m moderate risk q687mlow risk yearly ) Urine drug screen today- Yes [place]  If yes were their any concerning findings? -None  No flowsheet data found.   Controlled substance contract signed on: Today  Relevant past medical, surgical, family and social  history reviewed and updated as indicated. Interim medical history since our last visit reviewed. Allergies and medications reviewed and updated.  Review of Systems  Constitutional: Positive for unexpected weight change. Negative for chills and fever.  Eyes: Negative for visual disturbance.  Respiratory: Negative for chest tightness and shortness of breath.   Cardiovascular: Negative for chest pain and leg swelling.  Musculoskeletal: Positive for back pain. Negative for gait problem.  Skin: Negative for rash.  Neurological: Positive for weakness and numbness. Negative for light-headedness and headaches.  Psychiatric/Behavioral: Negative for agitation and behavioral problems.  All other systems reviewed and are negative.   Per HPI unless specifically indicated above   Allergies as of 08/23/2020      Reactions   Nitrofurantoin Other (See Comments)   Liver failure   Nsaids Other (See Comments)   Kidney failure   Other Other (See Comments)   Most antibiotics require supervision per patient   Flonase [fluticasone Propionate] Other (See Comments)   headache   Prevnar 13 [pneumococcal 13-val Conj Vacc] Other (See Comments)   Nerve damage in left arm   Beta Adrenergic Blockers    Nortriptyline Hcl Other (See Comments)   States she felt like worms where crawling under her skin.   Potassium-containing Compounds    Rash and raw areas / sore breast    Alendronate Sodium Other (See Comments)   Ciprofloxacin Other (See Comments)   Patient denies allergy--"I take Cipro for bladder infections"   Codeine Other (See Comments)   Crestor [rosuvastatin Calcium] Other (  See Comments)   Darvon Other (See Comments)   Lipitor [atorvastatin Calcium] Other (See Comments)   Pravachol Other (See Comments)   Propranolol Hcl Other (See Comments)   Unknown   Sulfa Antibiotics Other (See Comments)   Sulfamethoxazole Other (See Comments)   Unknown   Ultram [tramadol Hcl] Itching   Welchol [colesevelam  Hcl] Other (See Comments)   Zetia [ezetimibe] Other (See Comments)   Zocor [simvastatin] Other (See Comments)      Medication List       Accurate as of August 23, 2020 11:58 AM. If you have any questions, ask your nurse or doctor.        amLODipine 5 MG tablet Commonly known as: NORVASC TAKE 1 TABLET BY MOUTH  DAILY   aspirin EC 81 MG tablet Take 81 mg by mouth daily.   ciprofloxacin 500 MG tablet Commonly known as: Cipro Take 1 tablet (500 mg total) by mouth 2 (two) times daily.   doxycycline 100 MG tablet Commonly known as: VIBRA-TABS Take 1 tablet (100 mg total) by mouth 2 (two) times daily. 1 po bid   famotidine 20 MG tablet Commonly known as: PEPCID Take 1 tablet (20 mg total) by mouth 2 (two) times daily.   hydrochlorothiazide 25 MG tablet Commonly known as: HYDRODIURIL TAKE 1 TABLET BY MOUTH  DAILY   levothyroxine 50 MCG tablet Commonly known as: SYNTHROID Take 1 tablet (50 mcg total) by mouth daily before breakfast. What changed: See the new instructions. Changed by: [name] Ona Roehrs, MD   LORazepam 0.5 MG tablet Commonly known as: ATIVAN Take 1 tablet (0.5 mg total) by mouth 2 (two) times daily as needed.   methylPREDNISolone 4 MG tablet Commonly known as: Medrol Medrol dose pack. Take as instructed   PRESERVISION AREDS 2 PO Take 1 capsule by mouth at bedtime.   tiZANidine 4 MG tablet Commonly known as: Zanaflex Take 0.5 tablets (2 mg total) by mouth every 8 (eight) hours as needed for muscle spasms.   VITAMIN B-12 PO Take 600 mcg by mouth every morning.   Vitamin D 50 MCG (2000 UT) Caps Take 2,000 Units by mouth daily.        Objective:   BP (!) 156/83   Pulse 79   Temp 98 F (36.7 C)   Ht _0  (1.626 m)   Wt 162 lb (73.5 kg)   LMP 03/21/1971   SpO2 93%   BMI 27.81 kg/m   Wt Readings from Last 3 Encounters:  08/23/20 162 lb (73.5 kg)  04/20/20 165 lb (74.8 kg)  12/22/19 166 lb (75.3 kg)    Physical Exam Vitals and  nursing note reviewed.  Constitutional:      General: She is not in acute distress.    Appearance: She is well-developed. She is not diaphoretic.  Eyes:     Conjunctiva/sclera: Conjunctivae normal.  Cardiovascular:     Rate and Rhythm: Normal rate and regular rhythm.     Heart sounds: Normal heart sounds. No murmur heard.   Pulmonary:     Effort: Pulmonary effort is normal. No respiratory distress.     Breath sounds: Normal breath sounds. No wheezing.  Musculoskeletal:        General: No tenderness. Normal range of motion.  Skin:    General: Skin is warm and dry.     Findings: No rash.  Neurological:     Mental Status: She is alert and oriented to person, place, and time.     Coordination:  Coordination normal.  Psychiatric:        Mood and Affect: Mood is anxious and depressed.        Behavior: Behavior normal.        Thought Content: Thought content does not include suicidal ideation. Thought content does not include suicidal plan.       Assessment & Plan:   Problem List Items Addressed This Visit      Cardiovascular and Mediastinum   Essential hypertension   Relevant Medications   levothyroxine (SYNTHROID) 50 MCG tablet   Other Relevant Orders   CMP14+EGFR     Genitourinary   Interstitial cystitis   Relevant Medications   famotidine (PEPCID) 20 MG tablet     Other   HYPERCHOLESTEROLEMIA - Primary   Relevant Orders   Lipid panel   Adjustment disorder with mixed anxiety and depressed mood   Relevant Medications   LORazepam (ATIVAN) 0.5 MG tablet   Other Relevant Orders   CBC with Differential/Platelet    Other Visit Diagnoses    Flu vaccine need       Relevant Orders   Flu Vaccine QUAD High Dose(Fluad) (Completed)   Insomnia, unspecified type       Relevant Orders   ToxASSURE Select 13 (MW), Urine   CBC with Differential/Platelet   Acquired hypothyroidism       Relevant Medications   levothyroxine (SYNTHROID) 50 MCG tablet   Other Relevant Orders    TSH      Continue current medication, no changes, will check blood work today. Follow up plan: Return in about 4 months (around 12/23/2020), or if symptoms worsen or fail to improve, for Hypertension and hyperlipidemia and anxiety.  Counseling provided for all of the vaccine components Orders Placed This Encounter  Procedures  . Flu Vaccine QUAD High Dose(Fluad)  . ToxASSURE Select 13 (MW), Urine  . CBC with Differential/Platelet  . CMP14+EGFR  . Lipid panel    Caryl Pina, MD [facility] 08/23/2020, 11:58 AM

## 2020-08-24 DIAGNOSIS — Z7189 Other specified counseling: Secondary | ICD-10-CM | POA: Insufficient documentation

## 2020-08-24 LAB — CMP14+EGFR
ALT: 9 IU/L (ref 0–32)
AST: 15 IU/L (ref 0–40)
Albumin/Globulin Ratio: 1.3 (ref 1.2–2.2)
Albumin: 4.3 g/dL (ref 3.6–4.6)
Alkaline Phosphatase: 124 IU/L — ABNORMAL HIGH (ref 44–121)
BUN/Creatinine Ratio: 25 (ref 12–28)
BUN: 27 mg/dL (ref 8–27)
Bilirubin Total: 0.4 mg/dL (ref 0.0–1.2)
CO2: 27 mmol/L (ref 20–29)
Calcium: 9.3 mg/dL (ref 8.7–10.3)
Chloride: 97 mmol/L (ref 96–106)
Creatinine, Ser: 1.09 mg/dL — ABNORMAL HIGH (ref 0.57–1.00)
GFR calc Af Amer: 53 mL/min/{1.73_m2} — ABNORMAL LOW (ref >59)
GFR calc non Af Amer: 46 mL/min/{1.73_m2} — ABNORMAL LOW (ref >59)
Globulin, Total: 3.4 g/dL (ref 1.5–4.5)
Glucose: 97 mg/dL (ref 65–99)
Potassium: 4 mmol/L (ref 3.5–5.2)
Sodium: 138 mmol/L (ref 134–144)
Total Protein: 7.7 g/dL (ref 6.0–8.5)

## 2020-08-24 LAB — CBC WITH DIFFERENTIAL/PLATELET
Basophils Absolute: 0 10*3/uL (ref 0.0–0.2)
Basos: 1 %
EOS (ABSOLUTE): 0.2 10*3/uL (ref 0.0–0.4)
Eos: 2 %
Hematocrit: 47.1 % — ABNORMAL HIGH (ref 34.0–46.6)
Hemoglobin: 15.7 g/dL (ref 11.1–15.9)
Immature Grans (Abs): 0 10*3/uL (ref 0.0–0.1)
Immature Granulocytes: 0 %
Lymphocytes Absolute: 1.5 10*3/uL (ref 0.7–3.1)
Lymphs: 18 %
MCH: 31.4 pg (ref 26.6–33.0)
MCHC: 33.3 g/dL (ref 31.5–35.7)
MCV: 94 fL (ref 79–97)
Monocytes Absolute: 0.7 10*3/uL (ref 0.1–0.9)
Monocytes: 8 %
Neutrophils Absolute: 5.9 10*3/uL (ref 1.4–7.0)
Neutrophils: 71 %
Platelets: 274 10*3/uL (ref 150–450)
RBC: 5 x10E6/uL (ref 3.77–5.28)
RDW: 12.7 % (ref 11.7–15.4)
WBC: 8.4 10*3/uL (ref 3.4–10.8)

## 2020-08-24 LAB — LIPID PANEL
Chol/HDL Ratio: 5.2 ratio — ABNORMAL HIGH (ref 0.0–4.4)
Cholesterol, Total: 261 mg/dL — ABNORMAL HIGH (ref 100–199)
HDL: 50 mg/dL (ref >39)
LDL Chol Calc (NIH): 177 mg/dL — ABNORMAL HIGH (ref 0–99)
Triglycerides: 181 mg/dL — ABNORMAL HIGH (ref 0–149)
VLDL Cholesterol Cal: 34 mg/dL (ref 5–40)

## 2020-08-24 LAB — TSH: TSH: 1.5 u[IU]/mL (ref 0.450–4.500)

## 2020-08-24 NOTE — Progress Notes (Signed)
Cardiology Office Note   Date:  08/25/2020   ID:  Emily Wagner, DOB [birth year], MRN [id number]  PCP:  [name], [name], MD  Cardiologist:   No primary care provider on file.   Chief Complaint  Patient presents with  . Fatigue      History of Present Illness: Emily Wagner is a 86 y.o. female who presents for evaluation of atrial fib.     She has a history of  bypass surgery.  She has had fluctuating BPs.  Since I last saw her she saw on her chart that she had a diagnosis of post polio syndrome listed in 2009.  She is reporting this is likely because of her chronic fatigue she has been reporting for years.  She is not sure from home she got this diagnosis but she is disappointed she did not know about it sooner she might have been able to do physical therapy to reduce her symptoms.  She has chronic fatigue as her most significant complaint.  She gets around slowly with a walker.  She does household activities such as fixing her own meals.  She is not having any new cardiovascular symptoms. The patient denies any new symptoms such as chest discomfort, neck or arm discomfort. There has been no new shortness of breath, PND or orthopnea. There have been no reported palpitations, presyncope or syncope.   Of note her blood pressure was running up and the diastolics above 517.  She had not been taking amlodipine 5 mg as I had thought.  She was not taking any.  She started back on 2.5 mg.  She is taking this daily and her blood pressures been much better full.  I reviewed the blood pressure diary.   Past Medical History:  Diagnosis Date  . Adjustment disorder with mixed anxiety and depressed mood   . Atrial fibrillation ([place]) 11/04/2015   Post-operative  . Benign hypertensive heart disease without heart failure   . CAD (coronary artery disease)   . Infection of urinary tract 10/31/2015  . Interstitial cystitis   . Osteopenia   . Other and unspecified hyperlipidemia   . Other  dyspnea and respiratory abnormality   . Pelvic fracture ([place]) 05/30/2015  . 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 ABDOMINAL HYSTERECTOMY     Cancer and endometriosis  . CARDIAC CATHETERIZATION  2008  . CARDIAC CATHETERIZATION N/A 10/29/2015   Procedure: Left Heart Cath and Coronary Angiography;  Surgeon: Belva Crome, MD;  Location: [facility];  Service: Cardiovascular;  Laterality: N/A;  . CATARACT EXTRACTION    . CHOLECYSTECTOMY    . CORONARY ARTERY BYPASS GRAFT N/A 11/02/2015   Procedure: OFF PUMP CORONARY ARTERY BYPASS GRAFTING (CABG) TIMES ONE;  Surgeon: Rexene Alberts, MD;  Location: [facility];  Service: Open Heart Surgery;  Laterality: N/A;  LIMA to LAD  . FEMUR FRACTURE SURGERY  09/2015   right hip compression screws   . FRACTURE SURGERY     Right hip  . HERNIA REPAIR     Right inguinal  . TEE WITHOUT CARDIOVERSION N/A 11/02/2015   Procedure: TRANSESOPHAGEAL ECHOCARDIOGRAM (TEE);  Surgeon: Rexene Alberts, MD;  Location: [facility];  Service: Open Heart Surgery;  Laterality: N/A;     Current Outpatient Medications  Medication Sig Dispense Refill  . amLODipine (NORVASC) 5 MG tablet TAKE 1 TABLET BY  MOUTH  DAILY (Patient taking differently: 2.5 mg. ) 90 tablet 3  . aspirin EC 81 MG tablet Take 81 mg by mouth daily.    . Cholecalciferol (VITAMIN D) 2000 UNITS CAPS Take 2,000 Units by mouth daily.     . Cyanocobalamin (VITAMIN B-12 PO) Take 600 mcg by mouth every morning.     . famotidine (PEPCID) 20 MG tablet Take 1 tablet (20 mg total) by mouth 2 (two) times daily. 180 tablet 3  . hydrochlorothiazide (HYDRODIURIL) 25 MG tablet TAKE 1 TABLET BY MOUTH  DAILY 90 tablet 3  . LORazepam (ATIVAN) 0.5 MG tablet Take 1 tablet (0.5 mg total) by mouth 2 (two) times daily as needed. 60 tablet 3  . Multiple Vitamins-Minerals (PRESERVISION AREDS 2 PO) Take 1 capsule by mouth at bedtime.    Marland Kitchen tiZANidine (ZANAFLEX) 4 MG tablet  Take 0.5 tablets (2 mg total) by mouth every 8 (eight) hours as needed for muscle spasms. 30 tablet 0  . ciprofloxacin (CIPRO) 500 MG tablet Take 1 tablet (500 mg total) by mouth 2 (two) times daily. (Patient not taking: Reported on 08/25/2020) 90 tablet 3  . levothyroxine (SYNTHROID) 50 MCG tablet Take 1 tablet (50 mcg total) by mouth daily before breakfast. 90 tablet 3   No current facility-administered medications for this visit.    Allergies:   Nitrofurantoin, Nsaids, Other, Flonase [fluticasone propionate], Prevnar 13 [pneumococcal 13-val conj vacc], Beta adrenergic blockers, Nortriptyline hcl, Potassium-containing compounds, Alendronate sodium, Ciprofloxacin, Codeine, Crestor [rosuvastatin calcium], Darvon, Lipitor [atorvastatin calcium], Pravachol, Propranolol hcl, Sulfa antibiotics, Sulfamethoxazole, Ultram [tramadol hcl], Welchol [colesevelam hcl], Zetia [ezetimibe], and Zocor [simvastatin]    ROS:  Please see the history of present illness.   Otherwise, review of systems are positive for none fatigue, headaches, tremors, weakness.   All other systems are reviewed and negative.    PHYSICAL EXAM: VS:  BP 140/90   Pulse 83   Ht 5\' 4"  (1.626 m)   Wt 161 lb (73 kg)   LMP 03/21/1971   BMI 27.64 kg/m  , BMI Body mass index is 27.64 kg/m.  GENERAL:  Well appearing NECK:  No jugular venous distention, waveform within normal limits, carotid upstroke brisk and symmetric, no bruits, no thyromegaly LUNGS:  Clear to auscultation bilaterally CHEST:  Unremarkable HEART:  PMI not displaced or sustained,S1 and S2 within normal limits, no S3, no S4, no clicks, no rubs, no murmurs ABD:  Flat, positive bowel sounds normal in frequency in pitch, no bruits, no rebound, no guarding, no midline pulsatile mass, no hepatomegaly, no splenomegaly EXT:  2 plus pulses throughout, no edema, no cyanosis no clubbing   EKG:  EKG is   ordered today. The ekg ordered today demonstrates sinus rhythm, rate 83,  premature atrial contractions, axis within normal limits, possible old inferior infarct.   Recent Labs: 08/23/2020: ALT 9; BUN 27; Creatinine, Ser 1.09; Hemoglobin 15.7; Platelets 274; Potassium 4.0; Sodium 138; TSH 1.500    Lipid Panel    Component Value Date/Time   CHOL 261 (H) 08/23/2020 1215   TRIG 181 (H) 08/23/2020 1215   TRIG 162 (H) 06/24/2015 0918   HDL 50 08/23/2020 1215   HDL 44 06/24/2015 0918   CHOLHDL 5.2 (H) 08/23/2020 1215   CHOLHDL 5.4 10/31/2015 0215   VLDL 20 10/31/2015 0215   LDLCALC 177 (H) 08/23/2020 1215   LDLCALC 163 (H) 05/14/2014 1038      Wt Readings from Last 3 Encounters:  08/25/20 161 lb (73 kg)  08/23/20  162 lb (73.5 kg)  04/20/20 165 lb (74.8 kg)      Other studies Reviewed: Additional studies/ records that were reviewed today include: Labs Review of the above records demonstrates: See elsewhere   ASSESSMENT AND PLAN:   CAD:    She has no active chest discomfort.  No change in therapy is indicated.  DYSLIPIDEMIA:     She is been intolerant of statins.  She does not want to take PCSK9.  No change in therapy.  LDL most recently was 152.   HTN:  Her BP is at target as above.  No change in therapy.   COVID EDUCATION: She has a mild irritant vaccine and we talked about getting a booster on her mammogram.   Current medicines are reviewed at length with the patient today.  The patient does not have concerns regarding medicines.  The following changes have been made:  None  Labs/ tests ordered today include: None  Orders Placed This Encounter  Procedures  . EKG 12-Lead     Disposition:   FU with me in one year.    Signed, Minus Breeding, MD  08/25/2020 11:30 AM    [facility]

## 2020-08-25 ENCOUNTER — Ambulatory Visit: Payer: Medicare Other | Admitting: Cardiology

## 2020-08-25 ENCOUNTER — Telehealth: Payer: Self-pay | Admitting: Family Medicine

## 2020-08-25 ENCOUNTER — Other Ambulatory Visit: Payer: Self-pay

## 2020-08-25 ENCOUNTER — Encounter: Payer: Self-pay | Admitting: Cardiology

## 2020-08-25 VITALS — BP 140/90 | HR 83 | Ht 64.0 in | Wt 161.0 lb

## 2020-08-25 DIAGNOSIS — I2510 Atherosclerotic heart disease of native coronary artery without angina pectoris: Secondary | ICD-10-CM | POA: Diagnosis not present

## 2020-08-25 DIAGNOSIS — I10 Essential (primary) hypertension: Secondary | ICD-10-CM | POA: Diagnosis not present

## 2020-08-25 DIAGNOSIS — E785 Hyperlipidemia, unspecified: Secondary | ICD-10-CM | POA: Diagnosis not present

## 2020-08-25 DIAGNOSIS — N3010 Interstitial cystitis (chronic) without hematuria: Secondary | ICD-10-CM

## 2020-08-25 DIAGNOSIS — Z7189 Other specified counseling: Secondary | ICD-10-CM | POA: Diagnosis not present

## 2020-08-25 MED ORDER — CIPROFLOXACIN HCL 500 MG PO TABS
500.0000 mg | ORAL_TABLET | Freq: Two times a day (BID) | ORAL | 3 refills | Status: DC
Start: 2020-08-25 — End: 2021-05-02

## 2020-08-25 MED ORDER — LEVOTHYROXINE SODIUM 50 MCG PO TABS: 50.0000 ug | ORAL_TABLET | Freq: Every day | ORAL | 3 refills | Status: DC

## 2020-08-25 MED ORDER — FAMOTIDINE 20 MG PO TABS: 20.0000 mg | ORAL_TABLET | Freq: Two times a day (BID) | ORAL | 3 refills | Status: DC

## 2020-08-25 NOTE — Patient Instructions (Signed)
Medication Instructions:  The current medical regimen is effective;  continue present plan and medications.  *If you need a refill on your cardiac medications before your next appointment, please call your pharmacy*  Follow-Up: At [facility], you and your health needs are our priority.  As part of our continuing mission to provide you with exceptional heart care, we have created designated Provider Care Teams.  These Care Teams include your primary Cardiologist (physician) and Advanced Practice Providers (APPs -  Physician Assistants and Nurse Practitioners) who all work together to provide you with the care you need, when you need it.  We recommend signing up for the patient portal called "MyChart".  Sign up information is provided on this After Visit Summary.  MyChart is used to connect with patients for Virtual Visits (Telemedicine).  Patients are able to view lab/test results, encounter notes, upcoming appointments, etc.  Non-urgent messages can be sent to your provider as well.   To learn more about what you can do with MyChart, go to [email or web address].    Your next appointment:   12 month(s)  The format for your next appointment:   In Person  Provider:   James Hochrein, MD   Thank you for choosing [facility]!!     

## 2020-08-25 NOTE — Telephone Encounter (Signed)
Canceled cipro, pepcid and levo at [facility], reordered at [facility], pt aware

## 2020-08-26 LAB — TOXASSURE SELECT 13 (MW), URINE

## 2020-09-21 ENCOUNTER — Telehealth: Payer: Self-pay | Admitting: Physical Medicine and Rehabilitation

## 2020-09-21 DIAGNOSIS — B351 Tinea unguium: Secondary | ICD-10-CM | POA: Diagnosis not present

## 2020-09-21 DIAGNOSIS — M79676 Pain in unspecified toe(s): Secondary | ICD-10-CM | POA: Diagnosis not present

## 2020-09-21 NOTE — Telephone Encounter (Signed)
Pt called stating Dr.Newton told her to call back If the injection didn't work and the pt states the pain has come back just as bad as ever. Pt would like a CB to advise her what she should do next  [phone]

## 2020-09-22 NOTE — Telephone Encounter (Signed)
1) could try to get RFA done but sounds like she would have a lot of questions 2) best to OV

## 2020-09-22 NOTE — Telephone Encounter (Signed)
Patient had bilateral L5-S1 facet injections on 9/20. This was her second. She reports that she was "fairly comfortable" for about 3 weeks, but the pain returned. She cannot say if she had at least 50% relief. Please advise.

## 2020-09-22 NOTE — Telephone Encounter (Signed)
Called patient to schedule OV. No answer and no voicemail.

## 2020-09-23 NOTE — Telephone Encounter (Signed)
Left message #1 to schedule OV.

## 2020-09-24 ENCOUNTER — Telehealth: Payer: Self-pay | Admitting: Physical Medicine and Rehabilitation

## 2020-09-24 NOTE — Telephone Encounter (Signed)
Patient is returning call to [name]. Please call patient Monday morning. Patient phone number is [phone].

## 2020-09-27 NOTE — Telephone Encounter (Signed)
See previous message

## 2020-09-27 NOTE — Telephone Encounter (Signed)
Pt not req Auth#. 

## 2020-09-27 NOTE — Telephone Encounter (Signed)
Needs auth and scheduling for bilateral L5-S1 RFA.

## 2020-10-11 ENCOUNTER — Telehealth: Payer: Self-pay | Admitting: Physical Medicine and Rehabilitation

## 2020-10-11 NOTE — Telephone Encounter (Signed)
Patient called requesting a call back from [place]. Patient stated she would like an update of insurance approval for back procedure. Please call patient at [phone].

## 2020-10-11 NOTE — Telephone Encounter (Signed)
Any update on auth for this patient?

## 2020-10-11 NOTE — Telephone Encounter (Signed)
Called pt and sch 12/2

## 2020-10-28 ENCOUNTER — Ambulatory Visit: Payer: Self-pay

## 2020-10-28 ENCOUNTER — Encounter: Payer: Self-pay | Admitting: Physical Medicine and Rehabilitation

## 2020-10-28 ENCOUNTER — Ambulatory Visit: Payer: Medicare Other | Admitting: Physical Medicine and Rehabilitation

## 2020-10-28 ENCOUNTER — Other Ambulatory Visit: Payer: Self-pay

## 2020-10-28 VITALS — BP 147/94 | HR 86

## 2020-10-28 DIAGNOSIS — M47816 Spondylosis without myelopathy or radiculopathy, lumbar region: Secondary | ICD-10-CM

## 2020-10-28 MED ORDER — BETAMETHASONE SOD PHOS & ACET 6 (3-3) MG/ML IJ SUSP
12.0000 mg | Freq: Once | INTRAMUSCULAR | Status: AC
  Administered 2020-10-28: 12 mg

## 2020-10-28 NOTE — Progress Notes (Signed)
Pt state lower back pain. Pt state walking and standing makes the pain worse. Pt state heat helps ease the pain. Pt has hx of inj on 08/16/20 pt state it worked for two weeks.   Numeric Pain Rating Scale and Functional Assessment Average Pain 6   In the last MONTH (on 0-10 scale) has pain interfered with the following?  1. General activity like being  able to carry out your everyday physical activities such as walking, climbing stairs, carrying groceries, or moving a chair?  Rating(8)   +Driver, -BT, -Dye Allergies.

## 2020-11-01 ENCOUNTER — Other Ambulatory Visit: Payer: Medicare Other

## 2020-11-01 DIAGNOSIS — Z20822 Contact with and (suspected) exposure to covid-19: Secondary | ICD-10-CM | POA: Diagnosis not present

## 2020-11-04 LAB — NOVEL CORONAVIRUS, NAA

## 2020-11-05 DIAGNOSIS — R0989 Other specified symptoms and signs involving the circulatory and respiratory systems: Secondary | ICD-10-CM | POA: Diagnosis not present

## 2020-11-05 DIAGNOSIS — Z20822 Contact with and (suspected) exposure to covid-19: Secondary | ICD-10-CM | POA: Diagnosis not present

## 2020-11-05 DIAGNOSIS — R059 Cough, unspecified: Secondary | ICD-10-CM | POA: Diagnosis not present

## 2020-12-16 DIAGNOSIS — H10413 Chronic giant papillary conjunctivitis, bilateral: Secondary | ICD-10-CM | POA: Diagnosis not present

## 2020-12-16 DIAGNOSIS — H0102B Squamous blepharitis left eye, upper and lower eyelids: Secondary | ICD-10-CM | POA: Diagnosis not present

## 2020-12-16 DIAGNOSIS — H0102A Squamous blepharitis right eye, upper and lower eyelids: Secondary | ICD-10-CM | POA: Diagnosis not present

## 2020-12-16 DIAGNOSIS — H04123 Dry eye syndrome of bilateral lacrimal glands: Secondary | ICD-10-CM | POA: Diagnosis not present

## 2020-12-16 DIAGNOSIS — Z961 Presence of intraocular lens: Secondary | ICD-10-CM | POA: Diagnosis not present

## 2020-12-16 DIAGNOSIS — H353131 Nonexudative age-related macular degeneration, bilateral, early dry stage: Secondary | ICD-10-CM | POA: Diagnosis not present

## 2020-12-23 ENCOUNTER — Ambulatory Visit (INDEPENDENT_AMBULATORY_CARE_PROVIDER_SITE_OTHER): Payer: Medicare Other | Admitting: Family Medicine

## 2020-12-23 ENCOUNTER — Other Ambulatory Visit: Payer: Self-pay

## 2020-12-23 ENCOUNTER — Encounter: Payer: Self-pay | Admitting: Family Medicine

## 2020-12-23 VITALS — BP 129/75 | HR 91 | Ht 64.0 in | Wt 164.0 lb

## 2020-12-23 DIAGNOSIS — F4323 Adjustment disorder with mixed anxiety and depressed mood: Secondary | ICD-10-CM | POA: Diagnosis not present

## 2020-12-23 DIAGNOSIS — I10 Essential (primary) hypertension: Secondary | ICD-10-CM

## 2020-12-23 DIAGNOSIS — E7800 Pure hypercholesterolemia, unspecified: Secondary | ICD-10-CM

## 2020-12-23 DIAGNOSIS — E039 Hypothyroidism, unspecified: Secondary | ICD-10-CM | POA: Diagnosis not present

## 2020-12-23 MED ORDER — LORAZEPAM 0.5 MG PO TABS: 0.5000 mg | ORAL_TABLET | Freq: Two times a day (BID) | ORAL | 3 refills | Status: DC | PRN

## 2020-12-23 MED ORDER — PREDNISONE 20 MG PO TABS: ORAL_TABLET | ORAL | 0 refills | Status: DC

## 2020-12-23 NOTE — Progress Notes (Signed)
BP 129/75   Pulse 91   Ht 5\' 4"  (1.626 m)   Wt 164 lb (74.4 kg)   LMP 03/21/1971   SpO2 95%   BMI 28.15 kg/m    Subjective:   Patient ID: Emily Wagner, female    DOB: 07-Sep-1933, 87 y.o.   MRN: [id number]  HPI: Liyana Suniga is a 87 y.o. female presenting on 12/23/2020 for Medical Management of Chronic Issues and Hyperlipidemia   HPI Hypertension Patient is currently on amlodipine and hydrochlorothiazide, and their blood pressure today is 129/75. Patient denies any lightheadedness or dizziness. Patient denies headaches, blurred vision, chest pains, shortness of breath, or weakness. Denies any side effects from medication and is content with current medication.   Hypothyroidism recheck Patient is coming in for thyroid recheck today as well. They deny any issues with hair changes or heat or cold problems or diarrhea or constipation. They deny any chest pain or palpitations. They are currently on levothyroxine 50 micrograms   Patient feels like she is having increasing pains and aches from her post polio syndrome and she is feeling weaker and she feels like she cannot get up and do things and the more she is not getting up and doing things she is getting more weaker and having more pains.  She is stuck at home most of the time now because of the cold weather in the winter and not getting out and socializing either.  She is just feeling very down and in a lot of pain.  She is heard that sometimes you can give prednisone and has some people on long-term prednisone that she knows of for different illnesses and she wonders if she could try some prednisone.  Anxiety depression and anxiety state recheck. Current rx-lorazepam 0.5 mg twice daily as needed # meds rx-60 Effectiveness of current meds-works well Adverse reactions form meds-none  Pill count performed-No Last drug screen -08/31/2020 ( high risk q67m, moderate risk q32m, low risk yearly ) Urine drug screen today- No Was the  [facility] reviewed-yes  If yes were their any concerning findings? -None  No flowsheet data found.   Controlled substance contract signed on: 08/31/2020  Relevant past medical, surgical, family and social history reviewed and updated as indicated. Interim medical history since our last visit reviewed. Allergies and medications reviewed and updated.  Review of Systems  Constitutional: Negative for chills and fever.  Eyes: Negative for visual disturbance.  Respiratory: Negative for chest tightness and shortness of breath.   Cardiovascular: Negative for chest pain and leg swelling.  Musculoskeletal: Positive for arthralgias, back pain, gait problem and myalgias.  Skin: Negative for rash.  Neurological: Positive for weakness. Negative for dizziness, light-headedness and headaches.  Psychiatric/Behavioral: Negative for agitation and behavioral problems.  All other systems reviewed and are negative.   Per HPI unless specifically indicated above   Allergies as of 12/23/2020      Reactions   Nitrofurantoin Other (See Comments)   Liver failure   Nsaids Other (See Comments)   Kidney failure   Other Other (See Comments)   Most antibiotics require supervision per patient   Flonase [fluticasone Propionate] Other (See Comments)   headache   Prevnar 13 [pneumococcal 13-val Conj Vacc] Other (See Comments)   Nerve damage in left arm   Beta Adrenergic Blockers    Nortriptyline Hcl Other (See Comments)   States she felt like worms where crawling under her skin.   Potassium-containing Compounds    Rash and  raw areas / sore breast    Alendronate Sodium Other (See Comments)   Ciprofloxacin Other (See Comments)   Patient denies allergy--"I take Cipro for bladder infections"   Codeine Other (See Comments)   Crestor [rosuvastatin Calcium] Other (See Comments)   Darvon Other (See Comments)   Lipitor [atorvastatin Calcium] Other (See Comments)   Pravachol Other (See Comments)   Propranolol Hcl  Other (See Comments)   Unknown   Sulfa Antibiotics Other (See Comments)   Sulfamethoxazole Other (See Comments)   Unknown   Ultram [tramadol Hcl] Itching   Welchol [colesevelam Hcl] Other (See Comments)   Zetia [ezetimibe] Other (See Comments)   Zocor [simvastatin] Other (See Comments)      Medication List       Accurate as of December 23, 2020  9:34 AM. If you have any questions, ask your nurse or doctor.        amLODipine 5 MG tablet Commonly known as: NORVASC TAKE 1 TABLET BY MOUTH  DAILY What changed:   how much to take  how to take this  when to take this   aspirin EC 81 MG tablet Take 81 mg by mouth daily.   ciprofloxacin 500 MG tablet Commonly known as: Cipro Take 1 tablet (500 mg total) by mouth 2 (two) times daily.   famotidine 20 MG tablet Commonly known as: PEPCID Take 1 tablet (20 mg total) by mouth 2 (two) times daily.   hydrochlorothiazide 25 MG tablet Commonly known as: HYDRODIURIL TAKE 1 TABLET BY MOUTH  DAILY   levothyroxine 50 MCG tablet Commonly known as: SYNTHROID Take 1 tablet (50 mcg total) by mouth daily before breakfast.   LORazepam 0.5 MG tablet Commonly known as: ATIVAN Take 1 tablet (0.5 mg total) by mouth 2 (two) times daily as needed.   PRESERVISION AREDS 2 PO Take 1 capsule by mouth at bedtime.   tiZANidine 4 MG tablet Commonly known as: Zanaflex Take 0.5 tablets (2 mg total) by mouth every 8 (eight) hours as needed for muscle spasms.   VITAMIN B-12 PO Take 600 mcg by mouth every morning.   Vitamin D 50 MCG (2000 UT) Caps Take 2,000 Units by mouth daily.        Objective:   BP 129/75   Pulse 91   Ht 5\' 4"  (1.626 m)   Wt 164 lb (74.4 kg)   LMP 03/21/1971   SpO2 95%   BMI 28.15 kg/m   Wt Readings from Last 3 Encounters:  12/23/20 164 lb (74.4 kg)  08/25/20 161 lb (73 kg)  08/23/20 162 lb (73.5 kg)    Physical Exam Vitals and nursing note reviewed.  Constitutional:      General: She is not in acute  distress.    Appearance: She is well-developed and well-nourished. She is not diaphoretic.  Eyes:     Extraocular Movements: EOM normal.     Conjunctiva/sclera: Conjunctivae normal.  Cardiovascular:     Rate and Rhythm: Normal rate and regular rhythm.     Pulses: Intact distal pulses.     Heart sounds: Normal heart sounds. No murmur heard.   Pulmonary:     Effort: Pulmonary effort is normal. No respiratory distress.     Breath sounds: Normal breath sounds. No wheezing.  Musculoskeletal:        General: No tenderness or edema.  Skin:    General: Skin is warm and dry.     Findings: No rash.  Neurological:     Mental Status:  She is alert and oriented to person, place, and time.     Coordination: Coordination normal.  Psychiatric:        Mood and Affect: Mood and affect normal.        Behavior: Behavior normal.       Assessment & Plan:   Problem List Items Addressed This Visit      Cardiovascular and Mediastinum   Essential hypertension - Primary     Other   HYPERCHOLESTEROLEMIA   Adjustment disorder with mixed anxiety and depressed mood   Relevant Medications   LORazepam (ATIVAN) 0.5 MG tablet    Other Visit Diagnoses    Acquired hypothyroidism       Relevant Orders   TSH      Continue current medication, blood pressure looks good, continue lorazepam for anxiety.  Patient is thinking about possibly going into a nursing home but is still deciding on that and will think about it more in the future. Follow up plan: Return in about 4 months (around 04/22/2021), or if symptoms worsen or fail to improve, for Hypertension and hypothyroidism and anxiety recheck.  Counseling provided for all of the vaccine components No orders of the defined types were placed in this encounter.   Caryl Pina, MD [facility] 12/23/2020, 9:34 AM

## 2020-12-24 LAB — TSH: TSH: 1.5 u[IU]/mL (ref 0.450–4.500)

## 2020-12-28 ENCOUNTER — Ambulatory Visit: Payer: Medicare Other

## 2020-12-28 DIAGNOSIS — B351 Tinea unguium: Secondary | ICD-10-CM | POA: Diagnosis not present

## 2021-01-03 ENCOUNTER — Ambulatory Visit (INDEPENDENT_AMBULATORY_CARE_PROVIDER_SITE_OTHER): Payer: Medicare Other

## 2021-01-03 DIAGNOSIS — Z0000 Encounter for general adult medical examination without abnormal findings: Secondary | ICD-10-CM

## 2021-01-03 NOTE — Procedures (Signed)
Lumbar Facet Joint Nerve Denervation  Patient: Emily Wagner      Date of Birth: 07-Jun-1933 MRN: [id number] PCP: [name], [name], MD      Visit Date: 10/28/2020   Universal Protocol:    Date/Time: 02/07/225:32 AM  Consent Given By: the patient  Position: PRONE  Additional Comments: Vital signs were monitored before and after the procedure. Patient was prepped and draped in the usual sterile fashion. The correct patient, procedure, and site was verified.   Injection Procedure Details:   Procedure diagnoses:  1. Spondylosis without myelopathy or radiculopathy, lumbar region      Meds Administered:  Meds ordered this encounter  Medications  . betamethasone acetate-betamethasone sodium phosphate (CELESTONE) injection 12 mg     Laterality: Bilateral  Location/Site:  L5-S1, L4 medial branch and L5 dorsal ramus  Needle: 18 ga.,  69mm active tip RF Cannula  Needle Placement: Along juncture of superior articular process and transverse pocess  Findings:  -Comments:  Procedure Details: For each desired target nerve, the corresponding transverse process (sacral ala for the L5 dorsal rami) was identified and the fluoroscope was positioned to square off the endplates of the corresponding vertebral body to achieve a true AP midline view.  The beam was then obliqued 15 to 20 degrees and caudally tilted 15 to 20 degrees to line up a trajectory along the target nerves. The skin over the target of the junction of superior articulating process and transverse process (sacral ala for the L5 dorsal rami) was infiltrated with 77ml of 1% Lidocaine without Epinephrine.  The 18 gauge 55mm active tip outer cannula was advanced in trajectory view to the target.  This procedure was repeated for each target nerve.  Then, for all levels, the outer cannula placement was fine-tuned and the position was then confirmed with bi-planar imaging.    Test stimulation was done both at sensory and motor  levels to ensure there was no radicular stimulation. The target tissues were then infiltrated with 1 ml of 1% Lidocaine without Epinephrine. Subsequently, a percutaneous neurotomy was carried out for 90 seconds at 80 degrees Celsius.  After the completion of the lesion, 1 ml of injectate was delivered. It was then repeated for each facet joint nerve mentioned above. Appropriate radiographs were obtained to verify the probe placement during the neurotomy.   Additional Comments:  The patient tolerated the procedure well Dressing: 2 x 2 sterile gauze and Band-Aid    Post-procedure details: Patient was observed during the procedure. Post-procedure instructions were reviewed.  Patient left the clinic in stable condition.

## 2021-01-03 NOTE — Progress Notes (Signed)
Latisha Lasch - 87 y.o. female MRN [id number]  Date of birth: Dec 01, 1932  Office Visit Note: Visit Date: 10/28/2020 PCP: [name], [name], MD Referred by: [name], [name], MD  Subjective: Chief Complaint  Patient presents with  . Lower Back - Pain   HPI:  Makalya Nave is a 87 y.o. female who comes in today for planned radiofrequency ablation of the Bilateral L5-S1 Lumbar facet joints. This would be ablation of the corresponding medial branches and/or dorsal rami.  Patient has had double diagnostic blocks with more than 50% relief.  These are documented on pain diary.  They have had chronic back pain for quite some time, more than 3 months, which has been an ongoing situation with recalcitrant axial back pain.  They have no radicular pain.  Their axial pain is worse with standing and ambulating and on exam today with facet loading.  They have had physical therapy as well as home exercise program.  The imaging noted in the chart below indicated facet pathology. Accordingly they meet all the criteria and qualification for for radiofrequency ablation and we are going to complete this today hopefully for more longer term relief as part of comprehensive management program.   ROS Otherwise per HPI.  Assessment & Plan: Visit Diagnoses:    ICD-10-CM   1. Spondylosis without myelopathy or radiculopathy, lumbar region  M47.816 XR C-ARM NO REPORT    Radiofrequency,Lumbar    betamethasone acetate-betamethasone sodium phosphate (CELESTONE) injection 12 mg    Plan: No additional findings.   Meds & Orders:  Meds ordered this encounter  Medications  . betamethasone acetate-betamethasone sodium phosphate (CELESTONE) injection 12 mg    Orders Placed This Encounter  Procedures  . Radiofrequency,Lumbar  . XR C-ARM NO REPORT    Follow-up: Return if symptoms worsen or fail to improve.   Procedures: No procedures performed  Lumbar Facet Joint Nerve Denervation  Patient: Yulia Ulrich      Date of Birth: 1933/11/09 MRN: [id number] PCP: [name], [name], MD      Visit Date: 10/28/2020   Universal Protocol:    Date/Time: 02/07/225:32 AM  Consent Given By: the patient  Position: PRONE  Additional Comments: Vital signs were monitored before and after the procedure. Patient was prepped and draped in the usual sterile fashion. The correct patient, procedure, and site was verified.   Injection Procedure Details:   Procedure diagnoses:  1. Spondylosis without myelopathy or radiculopathy, lumbar region      Meds Administered:  Meds ordered this encounter  Medications  . betamethasone acetate-betamethasone sodium phosphate (CELESTONE) injection 12 mg     Laterality: Bilateral  Location/Site:  L5-S1, L4 medial branch and L5 dorsal ramus  Needle: 18 ga.,  84mm active tip RF Cannula  Needle Placement: Along juncture of superior articular process and transverse pocess  Findings:  -Comments:  Procedure Details: For each desired target nerve, the corresponding transverse process (sacral ala for the L5 dorsal rami) was identified and the fluoroscope was positioned to square off the endplates of the corresponding vertebral body to achieve a true AP midline view.  The beam was then obliqued 15 to 20 degrees and caudally tilted 15 to 20 degrees to line up a trajectory along the target nerves. The skin over the target of the junction of superior articulating process and transverse process (sacral ala for the L5 dorsal rami) was infiltrated with 72ml of 1% Lidocaine without Epinephrine.  The 18 gauge 50mm active  tip outer cannula was advanced in trajectory view to the target.  This procedure was repeated for each target nerve.  Then, for all levels, the outer cannula placement was fine-tuned and the position was then confirmed with bi-planar imaging.    Test stimulation was done both at sensory and motor levels to ensure there was no radicular stimulation. The  target tissues were then infiltrated with 1 ml of 1% Lidocaine without Epinephrine. Subsequently, a percutaneous neurotomy was carried out for 90 seconds at 80 degrees Celsius.  After the completion of the lesion, 1 ml of injectate was delivered. It was then repeated for each facet joint nerve mentioned above. Appropriate radiographs were obtained to verify the probe placement during the neurotomy.   Additional Comments:  The patient tolerated the procedure well Dressing: 2 x 2 sterile gauze and Band-Aid    Post-procedure details: Patient was observed during the procedure. Post-procedure instructions were reviewed.  Patient left the clinic in stable condition.        Clinical History: 06/29/20 Lumbar xray  2 views of the lumbar spine show severe degenerative scoliosis and severe  degenerative disc disease throughout every level. There is also arthritis  of the posterior elements of the lower lumbar spine.     Objective:  VS:  HT:    WT:   BMI:     BP:(!) 147/94  HR:86bpm  TEMP: ( )  RESP:  Physical Exam Vitals and nursing note reviewed.  Constitutional:      General: She is not in acute distress.    Appearance: Normal appearance. She is not ill-appearing.  HENT:     Head: Normocephalic and atraumatic.     Right Ear: External ear normal.     Left Ear: External ear normal.  Eyes:     Extraocular Movements: Extraocular movements intact.  Cardiovascular:     Rate and Rhythm: Normal rate.     Pulses: Normal pulses.  Pulmonary:     Effort: Pulmonary effort is normal. No respiratory distress.  Abdominal:     General: There is no distension.     Palpations: Abdomen is soft.  Musculoskeletal:        General: Tenderness present.     Cervical back: Neck supple.     Right lower leg: No edema.     Left lower leg: No edema.     Comments: Patient has good distal strength with no pain over the greater trochanters.  No clonus or focal weakness. Patient somewhat slow to rise  from a seated position to full extension.  There is concordant low back pain with facet loading and lumbar spine extension rotation.  There are no definitive trigger points but the patient is somewhat tender across the lower back and PSIS.  There is no pain with hip rotation.   Skin:    Findings: No erythema, lesion or rash.  Neurological:     General: No focal deficit present.     Mental Status: She is alert and oriented to person, place, and time.     Sensory: No sensory deficit.     Motor: No weakness or abnormal muscle tone.     Coordination: Coordination normal.  Psychiatric:        Mood and Affect: Mood normal.        Behavior: Behavior normal.      Imaging: No results found.

## 2021-01-03 NOTE — Patient Instructions (Addendum)
  [facility] Maintenance Summary and Written Plan of Care  Emily Wagner ,  Thank you for allowing me to perform your Medicare Annual Wellness Visit and for your ongoing commitment to your health.   Health Maintenance & Immunization History Health Maintenance  Topic Date Due  . COVID-19 Vaccine (3 - Moderna risk 4-dose series) 03/23/2021 (Originally 03/02/2020)  . TETANUS/TDAP  12/23/2021 (Originally 04/14/2018)  . INFLUENZA VACCINE  Completed  . DEXA SCAN  Completed  . PNA vac Low Risk Adult  Completed   Immunization History  Administered Date(s) Administered  . Fluad Quad(high Dose 65+) 08/18/2019, 08/23/2020  . Influenza Whole 07/28/2010  . Influenza, High Dose Seasonal PF 07/15/2016, 09/03/2017, 08/23/2018  . Influenza,inj,Quad PF,6+ Mos 09/23/2013, 09/09/2014, 09/07/2015  . Moderna Sars-Covid-2 Vaccination 01/06/2020, 02/03/2020  . Pneumococcal Conjugate-13 05/14/2014  . Pneumococcal Polysaccharide-23 08/18/2019  . Td 04/14/2008    These are the patient goals that we discussed: Goals Addressed   None      This is a list of Health Maintenance Items that are overdue or due now: There are no preventive care reminders to display for this patient.   Orders/Referrals Placed Today: No orders of the defined types were placed in this encounter.  (Contact our referral department at [phone] if you have not spoken with someone about your referral appointment within the next 5 days)    Follow-up Plan  Scheduled with Dr. Warrick Parisian 04/19/21 at 10:55am.

## 2021-01-03 NOTE — Progress Notes (Signed)
MEDICARE ANNUAL WELLNESS VISIT  01/03/2021  Telephone Visit Disclaimer This Medicare AWV was conducted by telephone due to national recommendations for restrictions regarding the COVID-19 Pandemic (e.g. social distancing).  I verified, using two identifiers, that I am speaking with Emily Wagner or their authorized healthcare agent. I discussed the limitations, risks, security, and privacy concerns of performing an evaluation and management service by telephone and the potential availability of an in-person appointment in the future. The patient expressed understanding and agreed to proceed.  Location of Patient: Home Location of Provider (nurse):  [facility]  Subjective:    Emily Wagner is a 87 y.o. female patient of [name], [name], MD who had a Medicare Annual Wellness Visit today via telephone. Emily Wagner is Retired and lives alone. she has two daughters. One is deceased. she reports that she is socially active and does interact with friends/family regularly. she is not physically active and enjoys crossword puzzels.  Patient Care Team: [name], [name], MD as PCP - General (Family Medicine) Irine Seal, MD (Urology) Minus Breeding, MD (Cardiology) Steffanie Rainwater, [place] as Consulting Physician (Podiatry)  Advanced Directives 01/03/2021 01/03/2021 08/23/2018 10/29/2015 10/21/2015 05/31/2015 05/30/2015  Does Patient Have a Medical Advance Directive? Yes No;Yes No;Yes Yes No Yes Yes  Type of Advance Directive - Healthcare Power of [place];Living will - - Living will -  Does patient want to make changes to medical advance directive? - - - - - No - Patient declined -  Copy of Evaro in Chart? - - No - copy requested - - No - copy requested -  Would patient like information on creating a medical advance directive? - No - Patient declined No - Patient declined - - - [facility] Utilization Over the Past 12  Months: # of hospitalizations or ER visits: 0 # of surgeries: 0  Review of Systems    Patient reports that her overall health is unchanged compared to last year.  Patient Reported Readings (BP, Pulse, CBG, Weight, etc) none  Pain Assessment Pain : 0-10 Pain Score: 7  Pain Type: Chronic pain Pain Location: Hip Pain Orientation: Left Pain Descriptors / Indicators: Constant Pain Onset: More than a month ago Pain Frequency: Constant Pain Relieving Factors: Unable to take pain medications due to allergies Effect of Pain on Daily Activities: Unable to exercise due to leg and hip pain  Pain Relieving Factors: Unable to take pain medications due to allergies  Current Medications & Allergies (verified) Allergies as of 01/03/2021      Reactions   Nitrofurantoin Other (See Comments)   Liver failure   Nsaids Other (See Comments)   Kidney failure   Other Other (See Comments)   Most antibiotics require supervision per patient   Flonase [fluticasone Propionate] Other (See Comments)   headache   Prevnar 13 [pneumococcal 13-val Conj Vacc] Other (See Comments)   Nerve damage in left arm   Beta Adrenergic Blockers    Nortriptyline Hcl Other (See Comments)   States she felt like worms where crawling under her skin.   Potassium-containing Compounds    Rash and raw areas / sore breast    Alendronate Sodium Other (See Comments)   Ciprofloxacin Other (See Comments)   Patient denies allergy--"I take Cipro for bladder infections"   Codeine Other (See Comments)   Crestor [rosuvastatin Calcium] Other (See Comments)   Darvon Other (See Comments)   Lipitor ONEOK  Calcium] Other (See Comments)   Pravachol Other (See Comments)   Propranolol Hcl Other (See Comments)   Unknown   Sulfa Antibiotics Other (See Comments)   Sulfamethoxazole Other (See Comments)   Unknown   Ultram [tramadol Hcl] Itching   Welchol [colesevelam Hcl] Other (See Comments)   Zetia [ezetimibe] Other (See Comments)    Zocor [simvastatin] Other (See Comments)      Medication List       Accurate as of January 03, 2021 10:16 AM. If you have any questions, ask your nurse or doctor.        amLODipine 5 MG tablet Commonly known as: NORVASC TAKE 1 TABLET BY MOUTH  DAILY What changed:   how much to take  how to take this  when to take this   aspirin EC 81 MG tablet Take 81 mg by mouth daily.   ciprofloxacin 500 MG tablet Commonly known as: Cipro Take 1 tablet (500 mg total) by mouth 2 (two) times daily.   famotidine 20 MG tablet Commonly known as: PEPCID Take 1 tablet (20 mg total) by mouth 2 (two) times daily.   hydrochlorothiazide 25 MG tablet Commonly known as: HYDRODIURIL TAKE 1 TABLET BY MOUTH  DAILY   levothyroxine 50 MCG tablet Commonly known as: SYNTHROID Take 1 tablet (50 mcg total) by mouth daily before breakfast.   LORazepam 0.5 MG tablet Commonly known as: ATIVAN Take 1 tablet (0.5 mg total) by mouth 2 (two) times daily as needed.   predniSONE 20 MG tablet Commonly known as: DELTASONE 2 po at same time daily for 5 days   PRESERVISION AREDS 2 PO Take 1 capsule by mouth at bedtime.   tiZANidine 4 MG tablet Commonly known as: Zanaflex Take 0.5 tablets (2 mg total) by mouth every 8 (eight) hours as needed for muscle spasms.   VITAMIN B-12 PO Take 600 mcg by mouth every morning.   Vitamin D 50 MCG (2000 UT) Caps Take 2,000 Units by mouth daily.       History (reviewed): Past Medical History:  Diagnosis Date  . Adjustment disorder with mixed anxiety and depressed mood   . Atrial fibrillation ([place]) 11/04/2015   Post-operative  . Benign hypertensive heart disease without heart failure   . CAD (coronary artery disease)   . Infection of urinary tract 10/31/2015  . Interstitial cystitis   . Osteopenia   . Other and unspecified hyperlipidemia   . Other dyspnea and respiratory abnormality   . Pelvic fracture ([place]) 05/30/2015  . 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 ABDOMINAL HYSTERECTOMY     Cancer and endometriosis  . CARDIAC CATHETERIZATION  2008  . CARDIAC CATHETERIZATION N/A 10/29/2015   Procedure: Left Heart Cath and Coronary Angiography;  Surgeon: Belva Crome, MD;  Location: [place] [facility];  Service: Cardiovascular;  Laterality: N/A;  . CATARACT EXTRACTION    . CHOLECYSTECTOMY    . CORONARY ARTERY BYPASS GRAFT N/A 11/02/2015   Procedure: OFF PUMP CORONARY ARTERY BYPASS GRAFTING (CABG) TIMES ONE;  Surgeon: Rexene Alberts, MD;  Location: [facility];  Service: Open Heart Surgery;  Laterality: N/A;  LIMA to LAD  . FEMUR FRACTURE SURGERY  09/2015   right hip compression screws   . FRACTURE SURGERY     Right hip  . HERNIA REPAIR     Right inguinal  . TEE WITHOUT CARDIOVERSION N/A 11/02/2015   Procedure: TRANSESOPHAGEAL  ECHOCARDIOGRAM (TEE);  Surgeon: Rexene Alberts, MD;  Location: [facility];  Service: Open Heart Surgery;  Laterality: N/A;   Family History  Problem Relation Age of Onset  . CAD Mother 38       Died suddenly  . CAD Father 34       Died suddenly  . CAD Daughter 66  . Diabetes type I Daughter        Since age 63  . Heart disease Sister   . Osteoporosis Sister    Social History   Socioeconomic History  . Marital status: Widowed    Spouse name: Not on file  . Number of children: 2  . Years of education: Not on file  . Highest education level: Not on file  Occupational History  . Occupation: retired    Fish farm manager: [employer]    Comment: admin. assistant  Tobacco Use  . Smoking status: Never Smoker  . Smokeless tobacco: Never Used  Vaping Use  . Vaping Use: Never used  Substance and Sexual Activity  . Alcohol use: No  . Drug use: No  . Sexual activity: Not on file  Other Topics Concern  . Not on file  Social History Narrative   Lived with daughter who has had a massive MI and stroke. - passes away   Social Determinants of Health   Financial Resource  Strain: Not on file  Food Insecurity: Not on file  Transportation Needs: Not on file  Physical Activity: Not on file  Stress: Not on file  Social Connections: Not on file    Activities of Daily Living In your present state of health, do you have any difficulty performing the following activities: 01/03/2021  Hearing? N  Vision? N  Difficulty concentrating or making decisions? N  Walking or climbing stairs? N  Dressing or bathing? N  Doing errands, shopping? N  Preparing Food and eating ? N  Using the Toilet? N  In the past six months, have you accidently leaked urine? N  Do you have problems with loss of bowel control? N  Managing your Medications? N  Managing your Finances? N  Housekeeping or managing your Housekeeping? Y  Some recent data might be hidden    Patient Education/ Literacy How often do you need to have someone help you when you read instructions, pamphlets, or other written materials from your doctor or pharmacy?: 2 - Rarely What is the last grade level you completed in school?: 12 th grade  Exercise Current Exercise Habits: The patient does not participate in regular exercise at present, Exercise limited by: orthopedic condition(s);cardiac condition(s)  Diet Patient reports consuming 3 meals a day and 2 snack(s) a day Patient reports that her primary diet is: Regular Patient reports that she does have regular access to food.   Depression Screen PHQ 2/9 Scores 01/03/2021 12/23/2020 08/23/2020 04/20/2020 12/22/2019 08/18/2019 12/06/2018  PHQ - 2 Score 1 1 0 0 0 0 1     Fall Risk Fall Risk  01/03/2021 12/23/2020 08/23/2020 04/20/2020 12/22/2019  Falls in the past year? 0 0 0 0 0  Number falls in past yr: - - - - -  Injury with Fall? - - - - -  Comment - - - - -  Risk Factor Category  - - - - -  Risk for fall due to : - - - - -  Follow up - - - - -     Objective:  Emily Wagner seemed alert and  oriented and she participated appropriately during our telephone  visit.  Blood Pressure Weight BMI  BP Readings from Last 3 Encounters:  12/23/20 129/75  10/28/20 (!) 147/94  08/25/20 140/90   Wt Readings from Last 3 Encounters:  12/23/20 164 lb (74.4 kg)  08/25/20 161 lb (73 kg)  08/23/20 162 lb (73.5 kg)   BMI Readings from Last 1 Encounters:  12/23/20 28.15 kg/m    *Unable to obtain current vital signs, weight, and BMI due to telephone visit type  Hearing/Vision  . Emily Wagner did not seem to have difficulty with hearing/understanding during the telephone conversation . Reports that she has had a formal eye exam by an eye care professional within the past year . Reports that she has not had a formal hearing evaluation within the past year *Unable to fully assess hearing and vision during telephone visit type  Cognitive Function:    6CIT Screen 01/03/2021  What Year? 0 points  What month? 0 points  What time? 0 points  Count back from 20 0 points  Months in reverse 0 points  Repeat phrase 0 points    (Normal:0-7, Significant for Dysfunction: >8)  Normal Cognitive Function Screening: Yes   Immunization & Health Maintenance Record Immunization History  Administered Date(s) Administered  . Fluad Quad(high Dose 65+) 08/18/2019, 08/23/2020  . Influenza Whole 07/28/2010  . Influenza, High Dose Seasonal PF 07/15/2016, 09/03/2017, 08/23/2018  . Influenza,inj,Quad PF,6+ Mos 09/23/2013, 09/09/2014, 09/07/2015  . Moderna Sars-Covid-2 Vaccination 01/06/2020, 02/03/2020  . Pneumococcal Conjugate-13 05/14/2014  . Pneumococcal Polysaccharide-23 08/18/2019  . Td 04/14/2008    Health Maintenance  Topic Date Due  . COVID-19 Vaccine (3 - Moderna risk 4-dose series) 03/23/2021 (Originally 03/02/2020)  . TETANUS/TDAP  12/23/2021 (Originally 04/14/2018)  . INFLUENZA VACCINE  Completed  . DEXA SCAN  Completed  . PNA vac Low Risk Adult  Completed       Assessment  This is a routine wellness examination for Emily Wagner.  Health Maintenance:  Due or Overdue There are no preventive care reminders to display for this patient.  Emily Wagner does not need a referral for Community Assistance: Care Management:   no Social Work:    no Prescription Assistance:  no Nutrition/Diabetes Education:  no   Plan:  Personalized Goals  Fall prevention  Increase energy level  Personalized Health Maintenance & Screening Recommendations    Lung Cancer Screening Recommended: no (Low Dose CT Chest recommended if Age 36-80 years, 30 pack-year currently smoking OR have quit w/in past 15 years) Hepatitis C Screening recommended: no HIV Screening recommended: no  Advanced Directives: Written information was not prepared per patient's request.  Referrals & Orders No orders of the defined types were placed in this encounter.   Follow-up Plan . Follow-up with [name], [name], MD as planned . Schedule 04/19/2021    I have personally reviewed and noted the following in the patient's chart:   . Medical and social history . Use of alcohol, tobacco or illicit drugs  . Current medications and supplements . Functional ability and status . Nutritional status . Physical activity . Advanced directives . List of other physicians . Hospitalizations, surgeries, and ER visits in previous 12 months . Vitals . Screenings to include cognitive, depression, and falls . Referrals and appointments  In addition, I have reviewed and discussed with Emily Wagner certain preventive protocols, quality metrics, and best practice recommendations. A written personalized care plan for preventive services as well as  general preventive health recommendations is available and can be mailed to the patient at her request.      Maud Deed [facility]  [id number]

## 2021-03-04 ENCOUNTER — Encounter: Payer: Self-pay | Admitting: *Deleted

## 2021-03-22 DIAGNOSIS — M79676 Pain in unspecified toe(s): Secondary | ICD-10-CM | POA: Diagnosis not present

## 2021-03-22 DIAGNOSIS — B351 Tinea unguium: Secondary | ICD-10-CM | POA: Diagnosis not present

## 2021-03-27 ENCOUNTER — Emergency Department (HOSPITAL_COMMUNITY): Payer: Medicare Other

## 2021-03-27 ENCOUNTER — Emergency Department (HOSPITAL_COMMUNITY)
Admission: EM | Admit: 2021-03-27 | Discharge: 2021-03-27 | Disposition: A | Discharge status: Home / Self Care | Payer: Medicare Other | Attending: Emergency Medicine | Admitting: Emergency Medicine

## 2021-03-27 DIAGNOSIS — I119 Hypertensive heart disease without heart failure: Secondary | ICD-10-CM | POA: Insufficient documentation

## 2021-03-27 DIAGNOSIS — M810 Age-related osteoporosis without current pathological fracture: Secondary | ICD-10-CM | POA: Diagnosis not present

## 2021-03-27 DIAGNOSIS — Z7982 Long term (current) use of aspirin: Secondary | ICD-10-CM | POA: Diagnosis not present

## 2021-03-27 DIAGNOSIS — I10 Essential (primary) hypertension: Secondary | ICD-10-CM | POA: Diagnosis not present

## 2021-03-27 DIAGNOSIS — I499 Cardiac arrhythmia, unspecified: Secondary | ICD-10-CM | POA: Diagnosis not present

## 2021-03-27 DIAGNOSIS — Z7902 Long term (current) use of antithrombotics/antiplatelets: Secondary | ICD-10-CM | POA: Diagnosis not present

## 2021-03-27 DIAGNOSIS — E569 Vitamin deficiency, unspecified: Secondary | ICD-10-CM | POA: Diagnosis not present

## 2021-03-27 DIAGNOSIS — E785 Hyperlipidemia, unspecified: Secondary | ICD-10-CM | POA: Diagnosis not present

## 2021-03-27 DIAGNOSIS — R29714 NIHSS score 14: Secondary | ICD-10-CM | POA: Diagnosis not present

## 2021-03-27 DIAGNOSIS — I2510 Atherosclerotic heart disease of native coronary artery without angina pectoris: Secondary | ICD-10-CM | POA: Diagnosis not present

## 2021-03-27 DIAGNOSIS — I63231 Cerebral infarction due to unspecified occlusion or stenosis of right carotid arteries: Secondary | ICD-10-CM | POA: Insufficient documentation

## 2021-03-27 DIAGNOSIS — H5347 Heteronymous bilateral field defects: Secondary | ICD-10-CM | POA: Diagnosis not present

## 2021-03-27 DIAGNOSIS — I614 Nontraumatic intracerebral hemorrhage in cerebellum: Secondary | ICD-10-CM | POA: Diagnosis not present

## 2021-03-27 DIAGNOSIS — I63511 Cerebral infarction due to unspecified occlusion or stenosis of right middle cerebral artery: Secondary | ICD-10-CM | POA: Diagnosis not present

## 2021-03-27 DIAGNOSIS — K219 Gastro-esophageal reflux disease without esophagitis: Secondary | ICD-10-CM | POA: Diagnosis not present

## 2021-03-27 DIAGNOSIS — K5900 Constipation, unspecified: Secondary | ICD-10-CM | POA: Diagnosis not present

## 2021-03-27 DIAGNOSIS — R000 Tachycardia, unspecified: Secondary | ICD-10-CM | POA: Diagnosis not present

## 2021-03-27 DIAGNOSIS — I629 Nontraumatic intracranial hemorrhage, unspecified: Secondary | ICD-10-CM | POA: Diagnosis not present

## 2021-03-27 DIAGNOSIS — I6521 Occlusion and stenosis of right carotid artery: Secondary | ICD-10-CM | POA: Insufficient documentation

## 2021-03-27 DIAGNOSIS — Z951 Presence of aortocoronary bypass graft: Secondary | ICD-10-CM | POA: Diagnosis not present

## 2021-03-27 DIAGNOSIS — F419 Anxiety disorder, unspecified: Secondary | ICD-10-CM | POA: Insufficient documentation

## 2021-03-27 DIAGNOSIS — N179 Acute kidney failure, unspecified: Secondary | ICD-10-CM | POA: Diagnosis not present

## 2021-03-27 DIAGNOSIS — M50322 Other cervical disc degeneration at C5-C6 level: Secondary | ICD-10-CM | POA: Diagnosis not present

## 2021-03-27 DIAGNOSIS — E038 Other specified hypothyroidism: Secondary | ICD-10-CM | POA: Insufficient documentation

## 2021-03-27 DIAGNOSIS — R9082 White matter disease, unspecified: Secondary | ICD-10-CM | POA: Diagnosis not present

## 2021-03-27 DIAGNOSIS — I6503 Occlusion and stenosis of bilateral vertebral arteries: Secondary | ICD-10-CM | POA: Diagnosis not present

## 2021-03-27 DIAGNOSIS — G8194 Hemiplegia, unspecified affecting left nondominant side: Secondary | ICD-10-CM | POA: Insufficient documentation

## 2021-03-27 DIAGNOSIS — H518 Other specified disorders of binocular movement: Secondary | ICD-10-CM | POA: Diagnosis not present

## 2021-03-27 DIAGNOSIS — Z79899 Other long term (current) drug therapy: Secondary | ICD-10-CM | POA: Insufficient documentation

## 2021-03-27 DIAGNOSIS — Z20822 Contact with and (suspected) exposure to covid-19: Secondary | ICD-10-CM | POA: Diagnosis not present

## 2021-03-27 DIAGNOSIS — I63131 Cerebral infarction due to embolism of right carotid artery: Secondary | ICD-10-CM | POA: Diagnosis not present

## 2021-03-27 DIAGNOSIS — I4891 Unspecified atrial fibrillation: Secondary | ICD-10-CM

## 2021-03-27 DIAGNOSIS — N183 Chronic kidney disease, stage 3 unspecified: Secondary | ICD-10-CM | POA: Diagnosis not present

## 2021-03-27 DIAGNOSIS — M542 Cervicalgia: Secondary | ICD-10-CM | POA: Diagnosis not present

## 2021-03-27 DIAGNOSIS — I69392 Facial weakness following cerebral infarction: Secondary | ICD-10-CM | POA: Diagnosis not present

## 2021-03-27 DIAGNOSIS — Z743 Need for continuous supervision: Secondary | ICD-10-CM | POA: Diagnosis not present

## 2021-03-27 DIAGNOSIS — N390 Urinary tract infection, site not specified: Secondary | ICD-10-CM | POA: Diagnosis not present

## 2021-03-27 DIAGNOSIS — R531 Weakness: Secondary | ICD-10-CM | POA: Insufficient documentation

## 2021-03-27 DIAGNOSIS — I639 Cerebral infarction, unspecified: Secondary | ICD-10-CM

## 2021-03-27 DIAGNOSIS — I63411 Cerebral infarction due to embolism of right middle cerebral artery: Secondary | ICD-10-CM | POA: Diagnosis not present

## 2021-03-27 DIAGNOSIS — G936 Cerebral edema: Secondary | ICD-10-CM | POA: Diagnosis not present

## 2021-03-27 DIAGNOSIS — I517 Cardiomegaly: Secondary | ICD-10-CM | POA: Diagnosis not present

## 2021-03-27 DIAGNOSIS — N1831 Chronic kidney disease, stage 3a: Secondary | ICD-10-CM | POA: Diagnosis not present

## 2021-03-27 DIAGNOSIS — I6340 Cerebral infarction due to embolism of unspecified cerebral artery: Secondary | ICD-10-CM | POA: Diagnosis not present

## 2021-03-27 DIAGNOSIS — R29810 Facial weakness: Secondary | ICD-10-CM | POA: Insufficient documentation

## 2021-03-27 DIAGNOSIS — E7849 Other hyperlipidemia: Secondary | ICD-10-CM | POA: Insufficient documentation

## 2021-03-27 DIAGNOSIS — E876 Hypokalemia: Secondary | ICD-10-CM | POA: Diagnosis not present

## 2021-03-27 DIAGNOSIS — E039 Hypothyroidism, unspecified: Secondary | ICD-10-CM | POA: Insufficient documentation

## 2021-03-27 DIAGNOSIS — R29818 Other symptoms and signs involving the nervous system: Secondary | ICD-10-CM | POA: Diagnosis not present

## 2021-03-27 DIAGNOSIS — F32A Depression, unspecified: Secondary | ICD-10-CM | POA: Diagnosis not present

## 2021-03-27 DIAGNOSIS — M47812 Spondylosis without myelopathy or radiculopathy, cervical region: Secondary | ICD-10-CM | POA: Diagnosis not present

## 2021-03-27 DIAGNOSIS — I63233 Cerebral infarction due to unspecified occlusion or stenosis of bilateral carotid arteries: Secondary | ICD-10-CM | POA: Diagnosis not present

## 2021-03-27 DIAGNOSIS — I6621 Occlusion and stenosis of right posterior cerebral artery: Secondary | ICD-10-CM | POA: Diagnosis not present

## 2021-03-27 DIAGNOSIS — R4781 Slurred speech: Secondary | ICD-10-CM | POA: Diagnosis not present

## 2021-03-27 DIAGNOSIS — R339 Retention of urine, unspecified: Secondary | ICD-10-CM | POA: Diagnosis not present

## 2021-03-27 DIAGNOSIS — R471 Dysarthria and anarthria: Secondary | ICD-10-CM | POA: Diagnosis not present

## 2021-03-27 DIAGNOSIS — I69354 Hemiplegia and hemiparesis following cerebral infarction affecting left non-dominant side: Secondary | ICD-10-CM | POA: Diagnosis not present

## 2021-03-27 DIAGNOSIS — R52 Pain, unspecified: Secondary | ICD-10-CM | POA: Diagnosis not present

## 2021-03-27 DIAGNOSIS — I129 Hypertensive chronic kidney disease with stage 1 through stage 4 chronic kidney disease, or unspecified chronic kidney disease: Secondary | ICD-10-CM | POA: Diagnosis not present

## 2021-03-27 LAB — CBC
HCT: 44.9 % (ref 36.0–46.0)
Hemoglobin: 14.8 g/dL (ref 12.0–15.0)
MCH: 31.2 pg (ref 26.0–34.0)
MCHC: 33 g/dL (ref 30.0–36.0)
MCV: 94.7 fL (ref 80.0–100.0)
Platelets: 273 10*3/uL (ref 150–400)
RBC: 4.74 MIL/uL (ref 3.87–5.11)
RDW: 12.8 % (ref 11.5–15.5)
WBC: 10.7 10*3/uL — ABNORMAL HIGH (ref 4.0–10.5)
nRBC: 0 % (ref 0.0–0.2)

## 2021-03-27 LAB — COMPREHENSIVE METABOLIC PANEL
ALT: 15 U/L (ref 0–44)
AST: 36 U/L (ref 15–41)
Albumin: 3.7 g/dL (ref 3.5–5.0)
Alkaline Phosphatase: 66 U/L (ref 38–126)
Anion gap: 11 (ref 5–15)
BUN: 26 mg/dL — ABNORMAL HIGH (ref 8–23)
CO2: 27 mmol/L (ref 22–32)
Calcium: 9.6 mg/dL (ref 8.9–10.3)
Chloride: 102 mmol/L (ref 98–111)
Creatinine, Ser: 1.15 mg/dL — ABNORMAL HIGH (ref 0.44–1.00)
GFR, Estimated: 46 mL/min — ABNORMAL LOW (ref >60)
Glucose, Bld: 136 mg/dL — ABNORMAL HIGH (ref 70–99)
Potassium: 3.9 mmol/L (ref 3.5–5.1)
Sodium: 140 mmol/L (ref 135–145)
Total Bilirubin: 1.5 mg/dL — ABNORMAL HIGH (ref 0.3–1.2)
Total Protein: 7.6 g/dL (ref 6.5–8.1)

## 2021-03-27 LAB — DIFFERENTIAL
Abs Immature Granulocytes: 0.03 10*3/uL (ref 0.00–0.07)
Basophils Absolute: 0 10*3/uL (ref 0.0–0.1)
Basophils Relative: 0 %
Eosinophils Absolute: 0 10*3/uL (ref 0.0–0.5)
Eosinophils Relative: 0 %
Immature Granulocytes: 0 %
Lymphocytes Relative: 11 %
Lymphs Abs: 1.1 10*3/uL (ref 0.7–4.0)
Monocytes Absolute: 0.6 10*3/uL (ref 0.1–1.0)
Monocytes Relative: 6 %
Neutro Abs: 8.9 10*3/uL — ABNORMAL HIGH (ref 1.7–7.7)
Neutrophils Relative %: 83 %

## 2021-03-27 LAB — I-STAT CHEM 8, ED
BUN: 36 mg/dL — ABNORMAL HIGH (ref 8–23)
Calcium, Ion: 1.1 mmol/L — ABNORMAL LOW (ref 1.15–1.40)
Chloride: 102 mmol/L (ref 98–111)
Creatinine, Ser: 1 mg/dL (ref 0.44–1.00)
Glucose, Bld: 132 mg/dL — ABNORMAL HIGH (ref 70–99)
HCT: 45 % (ref 36.0–46.0)
Hemoglobin: 15.3 g/dL — ABNORMAL HIGH (ref 12.0–15.0)
Potassium: 4.3 mmol/L (ref 3.5–5.1)
Sodium: 141 mmol/L (ref 135–145)
TCO2: 28 mmol/L (ref 22–32)

## 2021-03-27 LAB — APTT: aPTT: 31 seconds (ref 24–36)

## 2021-03-27 LAB — PROTIME-INR
INR: 1 (ref 0.8–1.2)
Prothrombin Time: 12.9 seconds (ref 11.4–15.2)

## 2021-03-27 MED ORDER — IOHEXOL 350 MG/ML SOLN
100.0000 mL | Freq: Once | INTRAVENOUS | Status: AC | PRN
  Administered 2021-03-27: 100 mL via INTRAVENOUS

## 2021-03-27 MED ORDER — SODIUM CHLORIDE 0.9% FLUSH
3.0000 mL | Freq: Once | INTRAVENOUS | Status: DC
Start: 2021-03-27 — End: 2021-03-27

## 2021-03-27 MED ORDER — SODIUM CHLORIDE 0.9 % IV SOLN: INTRAVENOUS | Status: DC

## 2021-03-27 NOTE — ED Notes (Signed)
To [facility] NOW

## 2021-03-27 NOTE — Consult Note (Signed)
Stroke Neurology Consultation Note  Consult Requested by: Dr. Ashok Cordia  Reason for Consult: code stroke  Consult Date: 03/27/21   The history was obtained from the EMS and pt.  During history and examination, all items were not able to obtain unless otherwise noted.  History of Present Illness:  Emily Wagner is a 87 y.o. Caucasian female with PMH of HTN and CAD presented to ER for code stroke.   Per EMS, pt was last known well at 7:30pm by family. This afternoon around 1pm, pt was found by family that she was on the floor, near a recliner at home. Per pt, she fell last night some time, not able to get up. On EMS arrival, pt had right gaze and left facial droop and left sided weakness. BP 153/100 and glucose 184. In ER, symptoms persistent. CT showed right temporal, insular and BG infarct.  CTA head and neck and perfusion showed right carotid artery occlusion at bulb extending to proximal left MCA and ACA.  CT perfusion showed large penumbra.  Per daughter over the phone, patient at baseline able to walk by herself with cane, lives alone and taking care of all daily activities.  Discussed with daughter regarding potential thrombectomy, daughter is in agreement.  IR suite was occupied at that time, I called [facility] IR transfer line, discussed with Dr. Arizona Constable and he accepted patient for emergent transfer for thrombectomy.  Patient was picked up by EMS for ER to ER transfer.  Patient left ER door at 3:25 PM.  LSN: 7:30 PM tPA Given: No: outside window IR - yes mRS = 2  Past Medical History:  Diagnosis Date  . Adjustment disorder with mixed anxiety and depressed mood   . Atrial fibrillation ([place]) 11/04/2015   Post-operative  . Benign hypertensive heart disease without heart failure   . CAD (coronary artery disease)   . Infection of urinary tract 10/31/2015  . Interstitial cystitis   . Osteopenia   . Other and unspecified hyperlipidemia   . Other dyspnea and respiratory  abnormality   . Pelvic fracture ([place]) 05/30/2015  . 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 ABDOMINAL HYSTERECTOMY     Cancer and endometriosis  . CARDIAC CATHETERIZATION  2008  . CARDIAC CATHETERIZATION N/A 10/29/2015   Procedure: Left Heart Cath and Coronary Angiography;  Surgeon: Belva Crome, MD;  Location: [place] [facility];  Service: Cardiovascular;  Laterality: N/A;  . CATARACT EXTRACTION    . CHOLECYSTECTOMY    . CORONARY ARTERY BYPASS GRAFT N/A 11/02/2015   Procedure: OFF PUMP CORONARY ARTERY BYPASS GRAFTING (CABG) TIMES ONE;  Surgeon: Rexene Alberts, MD;  Location: [place];  Service: Open Heart Surgery;  Laterality: N/A;  LIMA to LAD  . FEMUR FRACTURE SURGERY  09/2015   right hip compression screws   . FRACTURE SURGERY     Right hip  . HERNIA REPAIR     Right inguinal  . TEE WITHOUT CARDIOVERSION N/A 11/02/2015   Procedure: TRANSESOPHAGEAL ECHOCARDIOGRAM (TEE);  Surgeon: Rexene Alberts, MD;  Location: [place];  Service: Open Heart Surgery;  Laterality: N/A;    Family History  Problem Relation Age of Onset  . CAD Mother 49       Died suddenly  . CAD Father 79       Died suddenly  . CAD Daughter 60  . Diabetes type I Daughter  Since age 21  . Heart disease Sister   . Osteoporosis Sister     Social History:  reports that she has never smoked. She has never used smokeless tobacco. She reports that she does not drink alcohol and does not use drugs.  Allergies:  Allergies  Allergen Reactions  . Nitrofurantoin Other (See Comments)    Liver failure  . Nsaids Other (See Comments)    Kidney failure  . Other Other (See Comments)    Most antibiotics require supervision per patient  . Flonase [Fluticasone Propionate] Other (See Comments)    headache  . Prevnar 13 [Pneumococcal 13-Val Conj Vacc] Other (See Comments)    Nerve damage in left arm  . Beta Adrenergic Blockers   . Nortriptyline Hcl Other (See  Comments)    States she felt like worms where crawling under her skin.  Marland Kitchen Potassium-Containing Compounds     Rash and raw areas / sore breast   . Alendronate Sodium Other (See Comments)  . Ciprofloxacin Other (See Comments)    Patient denies allergy--"I take Cipro for bladder infections"  . Codeine Other (See Comments)  . Crestor [Rosuvastatin Calcium] Other (See Comments)  . Darvon Other (See Comments)  . Lipitor [Atorvastatin Calcium] Other (See Comments)  . Pravachol Other (See Comments)  . Propranolol Hcl Other (See Comments)    Unknown  . Sulfa Antibiotics Other (See Comments)  . Sulfamethoxazole Other (See Comments)    Unknown  . Ultram [Tramadol Hcl] Itching  . Welchol [Colesevelam Hcl] Other (See Comments)  . Zetia [Ezetimibe] Other (See Comments)  . Zocor [Simvastatin] Other (See Comments)    No current facility-administered medications on file prior to encounter.   Current Outpatient Medications on File Prior to Encounter  Medication Sig Dispense Refill  . amLODipine (NORVASC) 5 MG tablet TAKE 1 TABLET BY MOUTH  DAILY (Patient taking differently: 2.5 mg.) 90 tablet 3  . aspirin EC 81 MG tablet Take 81 mg by mouth daily.    . Cholecalciferol (VITAMIN D) 2000 UNITS CAPS Take 2,000 Units by mouth daily.     . ciprofloxacin (CIPRO) 500 MG tablet Take 1 tablet (500 mg total) by mouth 2 (two) times daily. 90 tablet 3  . Cyanocobalamin (VITAMIN B-12 PO) Take 600 mcg by mouth every morning.     . famotidine (PEPCID) 20 MG tablet Take 1 tablet (20 mg total) by mouth 2 (two) times daily. 180 tablet 3  . hydrochlorothiazide (HYDRODIURIL) 25 MG tablet TAKE 1 TABLET BY MOUTH  DAILY 90 tablet 3  . levothyroxine (SYNTHROID) 50 MCG tablet Take 1 tablet (50 mcg total) by mouth daily before breakfast. 90 tablet 3  . LORazepam (ATIVAN) 0.5 MG tablet Take 1 tablet (0.5 mg total) by mouth 2 (two) times daily as needed. 60 tablet 3  . Multiple Vitamins-Minerals (PRESERVISION AREDS 2 PO) Take  1 capsule by mouth at bedtime.    . predniSONE (DELTASONE) 20 MG tablet 2 po at same time daily for 5 days 10 tablet 0  . tiZANidine (ZANAFLEX) 4 MG tablet Take 0.5 tablets (2 mg total) by mouth every 8 (eight) hours as needed for muscle spasms. 30 tablet 0    Review of Systems: A full ROS was attempted today and was not able to be performed.   Physical Examination: Pulse Rate:  [52-122] 52 (05/01 1500) Resp:  [13-25] 15 (05/01 1500) BP: (125-134)/(55-83) 130/62 (05/01 1500) SpO2:  [94 %-97 %] 97 % (05/01 1500) Weight:  [74.8 kg] 74.8 kg (  05/01 1449)  General - well nourished, well developed, in no apparent distress.    Ophthalmologic - fundi not visualized due to noncooperation.    Cardiovascular - regular rhythm and rate.  Neuro - awake, alert, eyes open, orientated to age but not to months. No aphasia, limited speech output, following most simple commands. Able to name and repeat.  Mild to moderate dysarthria.  Right gaze preference, barely cross midline, left hemianopia, PERRL.  Left facial droop. Tongue midline.  Left upper and lower extremities 3-/5, drift to bed within 10/5 seconds. Sensation exam not corporative, right FTN intact, gait not tested.    NIH Stroke Scale  Level Of Consciousness 0=Alert; keenly responsive 1=Arouse to minor stimulation 2=Requires repeated stimulation to arouse or movements to pain 3=postures or unresponsive 0  LOC Questions to Month and Age 91=Answers both questions correctly 1=Answers one question correctly or dysarthria/intubated/trauma/language barrier 2=Answers neither question correctly or aphasia 1  LOC Commands      -Open/Close eyes     -Open/close grip     -Pantomime commands if communication barrier 0=Performs both tasks correctly 1=Performs one task correctly 2=Performs neighter task correctly 2  Best Gaze     -Only assess horizontal gaze 0=Normal 1=Partial gaze palsy 2=Forced deviation, or total gaze paresis 1  Visual 0=No visual  loss 1=Partial hemianopia 2=Complete hemianopia 3=Bilateral hemianopia (blind including cortical blindness) 2  Facial Palsy     -Use grimace if obtunded 0=Normal symmetrical movement 1=Minor paralysis (asymmetry) 2=Partial paralysis (lower face) 3=Complete paralysis (upper and lower face) 2  Motor  0=No drift for 10/5 seconds 1=Drift, but does not hit bed 2=Some antigravity effort, hits  bed 3=No effort against gravity, limb falls 4=No movement 0=Amputation/joint fusion Right Arm 0     Leg 0    Left Arm 2     Leg 2  Limb Ataxia     - FNT/HTS 0=Absent or does not understand or paralyzed or amputation/joint fusion 1=Present in one limb 2=Present in two limbs 0  Sensory 0=Normal 1=Mild to moderate sensory loss 2=Severe to total sensory loss or coma/unresponsive 1  Best Language 0=No aphasia, normal 1=Mild to moderate aphasia 2=Severe aphasia 3=Mute, global aphasia, or coma/unresponsive 0  Dysarthria 0=Normal 1=Mild to moderate 2=Severe, unintelligible or mute/anarthric 0=intubated/unable to test 1  Extinction/Neglect 0=No abnormality 1=visual/tactile/auditory/spatia/personal inattention/Extinction to bilateral simultaneous stimulation 2=Profound neglect/extinction more than 1 modality  1  Total   15     Data Reviewed: CT HEAD CODE STROKE WO CONTRAST  Result Date: 03/27/2021 CLINICAL DATA:  Code stroke. Right-sided facial droop and gaze disturbance. Weakness. EXAM: CT HEAD WITHOUT CONTRAST TECHNIQUE: Contiguous axial images were obtained from the base of the skull through the vertex without intravenous contrast. COMPARISON:  10/04/2014 FINDINGS: Brain: Chronic small-vessel ischemic changes affect the pons. There is acute to subacute infarction in the right lateral temporal lobe with low-density and loss of gray-white differentiation. This area measures about 5 cm in size. No evidence of hemorrhage or mass effect. There is probably also involvement of the basal ganglia and some  insular involvement. Chronic small-vessel ischemic changes are present elsewhere within the hemispheric white matter. No hydrocephalus or extra-axial collection. Vascular: There is atherosclerotic calcification of the major vessels at the base of the brain. Skull: Normal Sinuses/Orbits: Inflammatory changes of the left division of the sphenoid sinus. Orbits negative. Other: None ASPECTS ([place] Stroke Program Early CT Score) - Ganglionic level infarction (caudate, lentiform nuclei, internal capsule, insula, M1-M3 cortex): 4 - Supraganglionic infarction (M4-M6 cortex):  3 Total score (0-10 with 10 being normal): 7 IMPRESSION: 1. Acute infarction on the right affecting the lateral temporal lobe, insula and basal ganglia. No mass effect or hemorrhage. 2. ASPECTS is 7 3. These results were communicated to at 2:17 pmon 5/1/2022by text page via the [facility] messaging system. Electronically Signed   By: Nelson Chimes M.D.   On: 03/27/2021 14:18   CT ANGIO HEAD NECK W WO CM W PERF (CODE STROKE)  Result Date: 03/27/2021 CLINICAL DATA:  Right-sided weakness. EXAM: CT ANGIOGRAPHY HEAD AND NECK CT PERFUSION BRAIN TECHNIQUE: Multidetector CT imaging of the head and neck was performed using the standard protocol during bolus administration of intravenous contrast. Multiplanar CT image reconstructions and MIPs were obtained to evaluate the vascular anatomy. Carotid stenosis measurements (when applicable) are obtained utilizing NASCET criteria, using the distal internal carotid diameter as the denominator. Multiphase CT imaging of the brain was performed following IV bolus contrast injection. Subsequent parametric perfusion maps were calculated using RAPID software. CONTRAST:  180mL OMNIPAQUE IOHEXOL 350 MG/ML SOLN COMPARISON:  Head CT earlier same day FINDINGS: CTA NECK FINDINGS Aortic arch: Aortic atherosclerosis. Branching pattern is normal without origin stenosis. Right carotid system: Common carotid artery is widely patent to  the bifurcation. Occlusion of the ICA at the distal bulb. No reconstitution in the cervical region. Left carotid system: Common carotid artery shows some scattered plaque but is widely patent to the bifurcation. Calcified plaque at the carotid bifurcation and ICA bulb. Narrowing of the distal ICA bulb only 20%. Cervical ICA widely patent beyond that. The vessel is tortuous and ectatic in the upper cervical region, dilated to almost 8 mm. No stenosis. This could be due to ordinary atherosclerotic disease or fibromuscular disease. Vertebral arteries: Both vertebral artery origins are widely patent. Both vertebral arteries are widely patent through the cervical region to the foramen magnum. The left is dominant. Skeleton: Minimal cervical spondylosis. Other neck: No mass or lymphadenopathy. Upper chest: Normal Review of the MIP images confirms the above findings CTA HEAD FINDINGS Anterior circulation: Left internal carotid artery is patent through the skull base and siphon region. Siphon atherosclerotic calcification but without stenosis. Left anterior and middle cerebral vessels are widely patent. Flow from this vessel also opacifies the right anterior cerebral artery through a patent anterior communicating artery. Right internal carotid artery is occluded at the distal bulb as noted above, without reconstituted flow all the way through the supraclinoid ICA. Reconstituted flow is present in the right middle cerebral artery, probably coming from a patent posterior communicating artery on the right. Markedly diminished visualization of right MCA branch vessels. Posterior circulation: Both vertebral arteries widely patent to the basilar. 30% stenosis of the left vertebral artery V4 segment. No vertebral stenosis. Superior cerebellar and posterior cerebral arteries show flow. Right PCA branch vessels show narrowing and irregularity. Venous sinuses: Patent and normal. Anatomic variants: None significant. Review of the MIP  images confirms the above findings CT Brain Perfusion Findings: ASPECTS: 7 CBF (<30%) Volume: 16mL Perfusion (Tmax>6.0s) volume: 147mL Mismatch Volume: 134mL Infarction Location:Right lateral temporal lobe IMPRESSION: Occlusion of the right internal carotid artery at the distal bulb, through the supraclinoid ICA. Reconstituted flow in the right M1 segment, probably from a patent posterior communicating artery. Diminished visualization of the more distal right MCA branches. 9 cc region of C BF less than 30% in the right lateral temporal lobe. 184 cc region of T-max greater than 6 seconds throughout the right middle cerebral artery territory. Atherosclerotic disease at  the left carotid bifurcation with stenosis of 20%. Dilated irregular segment of the left common carotid artery beneath the skull base, probably secondary to fibromuscular disease. Case discussed with Dr. Erlinda Hong during performance, approximately 1435 hours. Electronically Signed   By: Nelson Chimes M.D.   On: 03/27/2021 14:52    Assessment: 87 y.o. female with PMH of HTN and CAD presented to ER for right gaze preference and left facial droop and left sided weakness.  Last known well 7:30 PM.  NIH score 15.  CT showed right temporal, insular and BG infarct.  CTA head and neck and perfusion showed right carotid artery occlusion at bulb extending to proximal left MCA and ACA.  CT perfusion showed large penumbra.  mRS = 2.  Discussed with daughter regarding potential thrombectomy, daughter is in agreement.  IR suite was occupied at that time, discussed with Dr. Arizona Constable at [facility] and he accepted patient for emergent transfer for thrombectomy.   Plan: - IVF to avoid low BP - permissive hypertension - ASA PR 300 - Emergent ER to ER transfer to [facility] for thrombectomy with Dr. Arizona Constable - Patient left ER door at 3:25 PM  Thank you for this consultation and allowing [name] to participate in the care of this patient.  This patient is  critically ill due to right MCA stroke, right carotid occlusion and at significant risk of neurological worsening, death form large right MCA stroke, hemorrhagic conversion, seizure. This patient's care requires constant monitoring of vital signs, hemodynamics, respiratory and cardiac monitoring, review of multiple databases, neurological assessment, discussion with family, other specialists and medical decision making of high complexity. I spent 45 minutes of neurocritical care time in the care of this patient. I had long discussion with daughter over the phone, updated pt current condition, treatment plan and potential prognosis, and answered all the questions.  She expressed understanding and appreciation.  I also discussed with EDP Dr. Ashok Cordia.  Rosalin Hawking, MD PhD Stroke Neurology 03/27/2021 6:46 PM

## 2021-03-27 NOTE — ED Provider Notes (Addendum)
[facility] EMERGENCY DEPARTMENT Provider Note   CSN: [id number] Arrival date & time: 03/27/21  1358  An emergency department physician performed an initial assessment on this suspected stroke patient at 1359.  History Chief Complaint  Patient presents with  . Code Stroke    Emily Wagner is a 87 y.o. female.  Patient presents via ems as code stroke activation. Pt was last known well/at baseline around 1930 last night. Son called this AM and no answer, and found patient on floor, incontinent, with rightward gaze and left sided weakness. Pt transported to ED via EMS. Pt alert, limited historian - level 5 caveat.   The history is provided by the patient and the EMS personnel. The history is limited by the condition of the patient.       Past Medical History:  Diagnosis Date  . Adjustment disorder with mixed anxiety and depressed mood   . Atrial fibrillation ([place]) 11/04/2015   Post-operative  . Benign hypertensive heart disease without heart failure   . CAD (coronary artery disease)   . Infection of urinary tract 10/31/2015  . Interstitial cystitis   . Osteopenia   . Other and unspecified hyperlipidemia   . Other dyspnea and respiratory abnormality   . Pelvic fracture ([place]) 05/30/2015  . 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 Aortic atherosclerosis ([place]) 12/12/2017  . Atrial fibrillation ([place]) 11/04/2015  . 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 Unstable angina ([place]) 10/29/2015  . CAD (coronary artery disease) 10/29/2015  . Benign hypertensive heart disease without heart failure   . Interstitial cystitis   . Allergy to multiple medications 07/15/2013  . S/P hip hemiarthroplasty 11/07/2012  . Closed intertrochanteric fracture of right femur ([place]) 10/19/2012  . Renal insufficiency   . Adjustment disorder with mixed anxiety and depressed mood   . Osteoporosis   . ASCVD (arteriosclerotic  cardiovascular disease)   . 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  . HYPERCHOLESTEROLEMIA 05/02/2008  . Essential hypertension 05/02/2008  . DIVERTICULOSIS OF COLON 08/27/2003    Past Surgical History:  Procedure Laterality Date  . ABDOMINAL HYSTERECTOMY     Cancer and endometriosis  . CARDIAC CATHETERIZATION  2008  . CARDIAC CATHETERIZATION N/A 10/29/2015   Procedure: Left Heart Cath and Coronary Angiography;  Surgeon: Belva Crome, MD;  Location: [facility];  Service: Cardiovascular;  Laterality: N/A;  . CATARACT EXTRACTION    . CHOLECYSTECTOMY    . CORONARY ARTERY BYPASS GRAFT N/A 11/02/2015   Procedure: OFF PUMP CORONARY ARTERY BYPASS GRAFTING (CABG) TIMES ONE;  Surgeon: Rexene Alberts, MD;  Location: [facility];  Service: Open Heart Surgery;  Laterality: N/A;  LIMA to LAD  . FEMUR FRACTURE SURGERY  09/2015   right hip compression screws   . FRACTURE SURGERY     Right hip  . HERNIA REPAIR     Right inguinal  . TEE WITHOUT CARDIOVERSION N/A 11/02/2015   Procedure: TRANSESOPHAGEAL ECHOCARDIOGRAM (TEE);  Surgeon: Rexene Alberts, MD;  Location: [facility];  Service: Open Heart Surgery;  Laterality: N/A;     OB History   No obstetric history on file.     Family History  Problem Relation Age of Onset  . CAD Mother 67       Died suddenly  . CAD Father 56       Died suddenly  . CAD Daughter 18  .  Diabetes type I Daughter        Since age 74  . Heart disease Sister   . Osteoporosis Sister     Social History   Tobacco Use  . Smoking status: Never Smoker  . Smokeless tobacco: Never Used  Vaping Use  . Vaping Use: Never used  Substance Use Topics  . Alcohol use: No  . Drug use: No    Home Medications Prior to Admission medications   Medication Sig Start Date End Date Taking? Authorizing Provider  amLODipine (NORVASC) 5 MG tablet TAKE 1 TABLET BY MOUTH  DAILY Patient taking differently: 2.5 mg. 07/30/20   Minus Breeding, MD  aspirin EC 81 MG tablet Take 81 mg by mouth  daily.    [provider]  Cholecalciferol (VITAMIN D) 2000 UNITS CAPS Take 2,000 Units by mouth daily.     [provider]  ciprofloxacin (CIPRO) 500 MG tablet Take 1 tablet (500 mg total) by mouth 2 (two) times daily. 08/25/20   [name], [name], MD  Cyanocobalamin (VITAMIN B-12 PO) Take 600 mcg by mouth every morning.     [provider]  famotidine (PEPCID) 20 MG tablet Take 1 tablet (20 mg total) by mouth 2 (two) times daily. 08/25/20   [name], [name], MD  hydrochlorothiazide (HYDRODIURIL) 25 MG tablet TAKE 1 TABLET BY MOUTH  DAILY 07/30/20   Minus Breeding, MD  levothyroxine (SYNTHROID) 50 MCG tablet Take 1 tablet (50 mcg total) by mouth daily before breakfast. 08/25/20   [name], [name], MD  LORazepam (ATIVAN) 0.5 MG tablet Take 1 tablet (0.5 mg total) by mouth 2 (two) times daily as needed. 12/23/20   [name], [name], MD  Multiple Vitamins-Minerals (PRESERVISION AREDS 2 PO) Take 1 capsule by mouth at bedtime.    [provider]  predniSONE (DELTASONE) 20 MG tablet 2 po at same time daily for 5 days 12/23/20   [name], [name], MD  tiZANidine (ZANAFLEX) 4 MG tablet Take 0.5 tablets (2 mg total) by mouth every 8 (eight) hours as needed for muscle spasms. 06/29/20   Mcarthur Rossetti, MD    Allergies    Nitrofurantoin, Nsaids, Other, Flonase [fluticasone propionate], Prevnar 13 [pneumococcal 13-val conj vacc], Beta adrenergic blockers, Nortriptyline hcl, Potassium-containing compounds, Alendronate sodium, Ciprofloxacin, Codeine, Crestor [rosuvastatin calcium], Darvon, Lipitor [atorvastatin calcium], Pravachol, Propranolol hcl, Sulfa antibiotics, Sulfamethoxazole, Ultram [tramadol hcl], Welchol [colesevelam hcl], Zetia [ezetimibe], and Zocor [simvastatin]  Review of Systems   Review of Systems  Unable to perform ROS: Mental status change  Constitutional: Negative for fever.  Neurological: Positive for weakness.  level 5 caveat - code  stroke activation/altered.    Physical Exam Updated Vital Signs BP (!) 125/55 (BP Location: Right Arm)   Pulse (!) 57   Resp (!) 22   Ht 1.626 m ($Remove'5\' 4"'[id number]$ )   Wt 74.8 kg   LMP 03/21/1971   SpO2 95%   BMI 28.31 kg/m   Physical Exam Vitals and nursing note reviewed.  Constitutional:      Appearance: Normal appearance. She is well-developed.  HENT:     Head: Atraumatic.     Nose: Nose normal.     Mouth/Throat:     Mouth: Mucous membranes are moist.  Eyes:     General: No scleral icterus.    Conjunctiva/sclera: Conjunctivae normal.     Pupils: Pupils are equal, round, and reactive to light.  Neck:     Vascular: No carotid bruit.     Trachea: No tracheal  deviation.  Cardiovascular:     Rate and Rhythm: Normal rate and regular rhythm.     Pulses: Normal pulses.     Heart sounds: Normal heart sounds. No murmur heard. No friction rub. No gallop.   Pulmonary:     Effort: Pulmonary effort is normal. No respiratory distress.     Breath sounds: Normal breath sounds.  Abdominal:     General: Bowel sounds are normal. There is no distension.     Palpations: Abdomen is soft.     Tenderness: There is no abdominal tenderness.  Genitourinary:    Comments: No cva tenderness.  Musculoskeletal:        General: No swelling.     Cervical back: Normal range of motion and neck supple. No rigidity. No muscular tenderness.  Skin:    General: Skin is warm and dry.     Findings: No rash.  Neurological:     Mental Status: She is alert.     Comments: Alert. Left weakness/drift - take emergently to CT.   Psychiatric:        Mood and Affect: Mood normal.     ED Results / Procedures / Treatments   Labs (all labs ordered are listed, but only abnormal results are displayed) Results for orders placed or performed during the hospital encounter of 03/27/21  Protime-INR  Result Value Ref Range   Prothrombin Time 12.9 11.4 - 15.2 seconds   INR 1.0 0.8 - 1.2  APTT  Result Value Ref Range   aPTT  31 24 - 36 seconds  CBC  Result Value Ref Range   WBC 10.7 (H) 4.0 - 10.5 K/uL   RBC 4.74 3.87 - 5.11 MIL/uL   Hemoglobin 14.8 12.0 - 15.0 g/dL   HCT 44.9 36.0 - 46.0 %   MCV 94.7 80.0 - 100.0 fL   MCH 31.2 26.0 - 34.0 pg   MCHC 33.0 30.0 - 36.0 g/dL   RDW 12.8 11.5 - 15.5 %   Platelets 273 150 - 400 K/uL   nRBC 0.0 0.0 - 0.2 %  Differential  Result Value Ref Range   Neutrophils Relative % 83 %   Neutro Abs 8.9 (H) 1.7 - 7.7 K/uL   Lymphocytes Relative 11 %   Lymphs Abs 1.1 0.7 - 4.0 K/uL   Monocytes Relative 6 %   Monocytes Absolute 0.6 0.1 - 1.0 K/uL   Eosinophils Relative 0 %   Eosinophils Absolute 0.0 0.0 - 0.5 K/uL   Basophils Relative 0 %   Basophils Absolute 0.0 0.0 - 0.1 K/uL   Immature Granulocytes 0 %   Abs Immature Granulocytes 0.03 0.00 - 0.07 K/uL  Comprehensive metabolic panel  Result Value Ref Range   Sodium 140 135 - 145 mmol/L   Potassium 3.9 3.5 - 5.1 mmol/L   Chloride 102 98 - 111 mmol/L   CO2 27 22 - 32 mmol/L   Glucose, Bld 136 (H) 70 - 99 mg/dL   BUN 26 (H) 8 - 23 mg/dL   Creatinine, Ser 1.15 (H) 0.44 - 1.00 mg/dL   Calcium 9.6 8.9 - 10.3 mg/dL   Total Protein 7.6 6.5 - 8.1 g/dL   Albumin 3.7 3.5 - 5.0 g/dL   AST 36 15 - 41 U/L   ALT 15 0 - 44 U/L   Alkaline Phosphatase 66 38 - 126 U/L   Total Bilirubin 1.5 (H) 0.3 - 1.2 mg/dL   GFR, Estimated 46 (L) >60 mL/min   Anion gap 11 5 -  15  I-stat chem 8, ED  Result Value Ref Range   Sodium 141 135 - 145 mmol/L   Potassium 4.3 3.5 - 5.1 mmol/L   Chloride 102 98 - 111 mmol/L   BUN 36 (H) 8 - 23 mg/dL   Creatinine, Ser 1.00 0.44 - 1.00 mg/dL   Glucose, Bld 132 (H) 70 - 99 mg/dL   Calcium, Ion 1.10 (L) 1.15 - 1.40 mmol/L   TCO2 28 22 - 32 mmol/L   Hemoglobin 15.3 (H) 12.0 - 15.0 g/dL   HCT 45.0 36.0 - 46.0 %   CT HEAD CODE STROKE WO CONTRAST  Result Date: 03/27/2021 CLINICAL DATA:  Code stroke. Right-sided facial droop and gaze disturbance. Weakness. EXAM: CT HEAD WITHOUT CONTRAST TECHNIQUE:  Contiguous axial images were obtained from the base of the skull through the vertex without intravenous contrast. COMPARISON:  10/04/2014 FINDINGS: Brain: Chronic small-vessel ischemic changes affect the pons. There is acute to subacute infarction in the right lateral temporal lobe with low-density and loss of gray-white differentiation. This area measures about 5 cm in size. No evidence of hemorrhage or mass effect. There is probably also involvement of the basal ganglia and some insular involvement. Chronic small-vessel ischemic changes are present elsewhere within the hemispheric white matter. No hydrocephalus or extra-axial collection. Vascular: There is atherosclerotic calcification of the major vessels at the base of the brain. Skull: Normal Sinuses/Orbits: Inflammatory changes of the left division of the sphenoid sinus. Orbits negative. Other: None ASPECTS ([place] Stroke Program Early CT Score) - Ganglionic level infarction (caudate, lentiform nuclei, internal capsule, insula, M1-M3 cortex): 4 - Supraganglionic infarction (M4-M6 cortex): 3 Total score (0-10 with 10 being normal): 7 IMPRESSION: 1. Acute infarction on the right affecting the lateral temporal lobe, insula and basal ganglia. No mass effect or hemorrhage. 2. ASPECTS is 7 3. These results were communicated to at 2:17 pmon 5/1/2022by text page via the [facility] messaging system. Electronically Signed   By: Nelson Chimes M.D.   On: 03/27/2021 14:18   CT ANGIO HEAD NECK W WO CM W PERF (CODE STROKE)  Result Date: 03/27/2021 CLINICAL DATA:  Right-sided weakness. EXAM: CT ANGIOGRAPHY HEAD AND NECK CT PERFUSION BRAIN TECHNIQUE: Multidetector CT imaging of the head and neck was performed using the standard protocol during bolus administration of intravenous contrast. Multiplanar CT image reconstructions and MIPs were obtained to evaluate the vascular anatomy. Carotid stenosis measurements (when applicable) are obtained utilizing NASCET criteria, using the  distal internal carotid diameter as the denominator. Multiphase CT imaging of the brain was performed following IV bolus contrast injection. Subsequent parametric perfusion maps were calculated using RAPID software. CONTRAST:  175mL OMNIPAQUE IOHEXOL 350 MG/ML SOLN COMPARISON:  Head CT earlier same day FINDINGS: CTA NECK FINDINGS Aortic arch: Aortic atherosclerosis. Branching pattern is normal without origin stenosis. Right carotid system: Common carotid artery is widely patent to the bifurcation. Occlusion of the ICA at the distal bulb. No reconstitution in the cervical region. Left carotid system: Common carotid artery shows some scattered plaque but is widely patent to the bifurcation. Calcified plaque at the carotid bifurcation and ICA bulb. Narrowing of the distal ICA bulb only 20%. Cervical ICA widely patent beyond that. The vessel is tortuous and ectatic in the upper cervical region, dilated to almost 8 mm. No stenosis. This could be due to ordinary atherosclerotic disease or fibromuscular disease. Vertebral arteries: Both vertebral artery origins are widely patent. Both vertebral arteries are widely patent through the cervical region to the foramen magnum. The  left is dominant. Skeleton: Minimal cervical spondylosis. Other neck: No mass or lymphadenopathy. Upper chest: Normal Review of the MIP images confirms the above findings CTA HEAD FINDINGS Anterior circulation: Left internal carotid artery is patent through the skull base and siphon region. Siphon atherosclerotic calcification but without stenosis. Left anterior and middle cerebral vessels are widely patent. Flow from this vessel also opacifies the right anterior cerebral artery through a patent anterior communicating artery. Right internal carotid artery is occluded at the distal bulb as noted above, without reconstituted flow all the way through the supraclinoid ICA. Reconstituted flow is present in the right middle cerebral artery, probably coming  from a patent posterior communicating artery on the right. Markedly diminished visualization of right MCA branch vessels. Posterior circulation: Both vertebral arteries widely patent to the basilar. 30% stenosis of the left vertebral artery V4 segment. No vertebral stenosis. Superior cerebellar and posterior cerebral arteries show flow. Right PCA branch vessels show narrowing and irregularity. Venous sinuses: Patent and normal. Anatomic variants: None significant. 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 Reconstituted flow in the right M1 segment, probably from a patent posterior communicating artery. Diminished visualization of the more distal right MCA branches. 9 cc region of C BF less than 30% in the right lateral temporal lobe. 184 cc region of T-max greater than 6 seconds throughout the right middle cerebral artery territory. Atherosclerotic disease at the left carotid bifurcation with stenosis of 20%. Dilated irregular segment of the left common carotid artery beneath the skull base, probably secondary to fibromuscular disease. Case discussed with Dr. Erlinda Hong during performance, approximately 1435 hours. Electronically Signed   By: Nelson Chimes M.D.   On: 03/27/2021 14:52    EKG EKG Interpretation  Date/Time:  Sunday Mar 27 2021 14:39:57 EDT Ventricular Rate:  112 PR Interval:    QRS Duration: 98 QT Interval:  352 QTC Calculation: 481 R Axis:   25 Text Interpretation: Atrial fibrillation Non-specific ST-t changes Confirmed by Lajean Saver [id number]) on 03/27/2021 3:14:20 PM   Radiology CT HEAD CODE STROKE WO CONTRAST  Result Date: 03/27/2021 CLINICAL DATA:  Code stroke. Right-sided facial droop and gaze  disturbance. Weakness. EXAM: CT HEAD WITHOUT CONTRAST TECHNIQUE: Contiguous axial images were obtained from the base of the skull through the vertex without intravenous contrast. COMPARISON:  10/04/2014 FINDINGS: Brain: Chronic small-vessel ischemic changes affect the pons. There is acute to subacute infarction in the right lateral temporal lobe with low-density and loss of gray-white differentiation. This area measures about 5 cm in size. No evidence of hemorrhage or mass effect. There is probably also involvement of the basal ganglia and some insular involvement. Chronic small-vessel ischemic changes are present elsewhere within the hemispheric white matter. No hydrocephalus or extra-axial collection. Vascular: There is atherosclerotic calcification of the major vessels at the base of the brain. Skull: Normal Sinuses/Orbits: Inflammatory changes of the left division of the sphenoid sinus. Orbits negative. Other: None ASPECTS ([place] Stroke Program Early CT Score) - Ganglionic level infarction (caudate, lentiform nuclei, internal capsule, insula, M1-M3 cortex): 4 - Supraganglionic infarction (M4-M6 cortex): 3 Total score (0-10 with 10 being normal): 7 IMPRESSION: 1. Acute infarction on the right affecting the lateral temporal lobe, insula and basal ganglia. No mass effect or hemorrhage. 2. ASPECTS is 7  3. These results were communicated to at 2:17 pmon 5/1/2022by text page via the [facility] messaging system. Electronically Signed   By: Nelson Chimes M.D.   On: 03/27/2021 14:18   CT ANGIO HEAD NECK W WO CM W PERF (CODE STROKE)  Result Date: 03/27/2021 CLINICAL DATA:  Right-sided weakness. EXAM: CT ANGIOGRAPHY HEAD AND NECK CT PERFUSION BRAIN TECHNIQUE: Multidetector CT imaging of the head and neck was performed using the standard protocol during bolus administration of intravenous contrast. Multiplanar CT image reconstructions and MIPs were obtained to evaluate the vascular anatomy. Carotid stenosis measurements  (when applicable) are obtained utilizing NASCET criteria, using the distal internal carotid diameter as the denominator. Multiphase CT imaging of the brain was performed following IV bolus contrast injection. Subsequent parametric perfusion maps were calculated using RAPID software. CONTRAST:  152mL OMNIPAQUE IOHEXOL 350 MG/ML SOLN COMPARISON:  Head CT earlier same day FINDINGS: CTA NECK FINDINGS Aortic arch: Aortic atherosclerosis. Branching pattern is normal without origin stenosis. Right carotid system: Common carotid artery is widely patent to the bifurcation. Occlusion of the ICA at the distal bulb. No reconstitution in the cervical region. Left carotid system: Common carotid artery shows some scattered plaque but is widely patent to the bifurcation. Calcified plaque at the carotid bifurcation and ICA bulb. Narrowing of the distal ICA bulb only 20%. Cervical ICA widely patent beyond that. The vessel is tortuous and ectatic in the upper cervical region, dilated to almost 8 mm. No stenosis. This could be due to ordinary atherosclerotic disease or fibromuscular disease. Vertebral arteries: Both vertebral artery origins are widely patent. Both vertebral arteries are widely patent through the cervical region to the foramen magnum. The left is dominant. Skeleton: Minimal cervical spondylosis. Other neck: No mass or lymphadenopathy. Upper chest: Normal Review of the MIP images confirms the above findings CTA HEAD FINDINGS Anterior circulation: Left internal carotid artery is patent through the skull base and siphon region. Siphon atherosclerotic calcification but without stenosis. Left anterior and middle cerebral vessels are widely patent. Flow from this vessel also opacifies the right anterior cerebral artery through a patent anterior communicating artery. Right internal carotid artery is occluded at the distal bulb as noted above, without reconstituted flow all the way through the supraclinoid ICA. Reconstituted  flow is present in the right middle cerebral artery, probably coming from a patent posterior communicating artery on the right. Markedly diminished visualization of right MCA branch vessels. Posterior circulation: Both vertebral arteries widely patent to the basilar. 30% stenosis of the left vertebral artery V4 segment. No vertebral stenosis. Superior cerebellar and posterior cerebral arteries show flow. Right PCA branch vessels show narrowing and irregularity. Venous sinuses: Patent and normal. Anatomic variants: None significant. 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 Reconstituted flow in the right M1 segment, probably from a patent posterior communicating artery. Diminished visualization of the more distal right MCA branches. 9 cc region of C BF less than 30% in the right lateral temporal lobe. 184 cc region of T-max greater than 6 seconds throughout the right middle cerebral artery territory. Atherosclerotic disease at the left carotid bifurcation with stenosis of 20%. Dilated irregular segment of the left common carotid artery beneath the skull base, probably secondary to fibromuscular disease. Case discussed with Dr. Erlinda Hong during performance, approximately  1435 hours. Electronically Signed   By: Nelson Chimes M.D.   On: 03/27/2021 14:52    Procedures Procedures   Medications Ordered in ED Medications  sodium chloride flush (NS) 0.9 % injection 3 mL (3 mLs Intravenous Not Given 03/27/21 1456)  iohexol (OMNIPAQUE) 350 MG/ML injection 100 mL (100 mLs Intravenous Contrast Given 03/27/21 1429)    ED Course  I have reviewed the triage vital signs and the nursing notes.  Pertinent labs & imaging results that were  available during my care of the patient were reviewed by me and considered in my medical decision making (see chart for details).    MDM Rules/Calculators/A&P                         Iv ns. Continuous pulse ox and cardiac monitoring. Stat labs and imaging. Code stroke activation/neurology emergently consulted and met patient at bridge.   Imaging and meds/treatment per neurology/code stroke team.   Reviewed nursing notes and prior charts for additional history.   Labs reviewed/interpreted by me - chem normal, hgb normal.   CT reviewed/interpreted by me - right cva.  Given LKN last night, not candidate for systemic tpa. Neurology has evaluated, LVO +, indicates IR/IR table not available at [facility], and that he has spoken to Dr Arizona Constable at [facility] who accepts in transfer. He indicates no [facility] truck available. 911 called to facilitate emergent transport/transfer.   Recheck pt, breathing comfortably, clears throat, handles secretions, and appears to be protecting airway well.   Pt currently appears stable for transfer.  CRITICAL CARE RE: code stroke activation/acute cva, LVO, requiring emergent IR Performed by: Mirna Mires Total critical care time: 35 minutes Critical care time was exclusive of separately billable procedures and treating other patients. Critical care was necessary to treat or prevent imminent or life-threatening deterioration. Critical care was time spent personally by me on the following activities: development of treatment plan with patient and/or surrogate as well as nursing, discussions with consultants, evaluation of patient's response to treatment, examination of patient, obtaining history from patient or surrogate, ordering and performing treatments and interventions, ordering and review of laboratory studies, ordering and review of radiographic studies, pulse oximetry and re-evaluation of patient's condition.    Final Clinical Impression(s) / ED Diagnoses Final  diagnoses:  None    Rx / DC Orders ED Discharge Orders    None           Lajean Saver, MD 03/27/21 [phone]

## 2021-03-27 NOTE — ED Notes (Signed)
Pt left [facility] ED w/ [facility] for transfer to [facility] for emergent IR. Report called to [facility] ED. [facility] given all paperwork and emtala.

## 2021-03-27 NOTE — ED Triage Notes (Signed)
Pt BIB [facility] as code stroke. Pt LKN 1930 last night (03/26/2021), pt lives alone, son called her this morning with no answer. Son found her laying on the floor on L side, incontinent, at 1230 today. Pt has R gaze, L arm drift, L facial droop. VSS

## 2021-03-27 NOTE — Discharge Instructions (Addendum)
Transfer to [facility].  °

## 2021-03-28 LAB — SARS CORONAVIRUS 2 (TAT 6-24 HRS): SARS Coronavirus 2: NEGATIVE

## 2021-04-05 DIAGNOSIS — I10 Essential (primary) hypertension: Secondary | ICD-10-CM | POA: Diagnosis not present

## 2021-04-05 DIAGNOSIS — E785 Hyperlipidemia, unspecified: Secondary | ICD-10-CM | POA: Diagnosis not present

## 2021-04-05 DIAGNOSIS — M542 Cervicalgia: Secondary | ICD-10-CM | POA: Diagnosis not present

## 2021-04-05 DIAGNOSIS — I6930 Unspecified sequelae of cerebral infarction: Secondary | ICD-10-CM | POA: Diagnosis not present

## 2021-04-05 DIAGNOSIS — R339 Retention of urine, unspecified: Secondary | ICD-10-CM | POA: Diagnosis not present

## 2021-04-05 DIAGNOSIS — K219 Gastro-esophageal reflux disease without esophagitis: Secondary | ICD-10-CM | POA: Diagnosis not present

## 2021-04-05 DIAGNOSIS — N390 Urinary tract infection, site not specified: Secondary | ICD-10-CM | POA: Diagnosis not present

## 2021-04-05 DIAGNOSIS — I4891 Unspecified atrial fibrillation: Secondary | ICD-10-CM | POA: Diagnosis not present

## 2021-04-05 DIAGNOSIS — I63511 Cerebral infarction due to unspecified occlusion or stenosis of right middle cerebral artery: Secondary | ICD-10-CM | POA: Diagnosis not present

## 2021-04-05 DIAGNOSIS — F32A Depression, unspecified: Secondary | ICD-10-CM | POA: Diagnosis not present

## 2021-04-05 DIAGNOSIS — I639 Cerebral infarction, unspecified: Secondary | ICD-10-CM | POA: Diagnosis not present

## 2021-04-05 DIAGNOSIS — R52 Pain, unspecified: Secondary | ICD-10-CM | POA: Diagnosis not present

## 2021-04-05 DIAGNOSIS — E038 Other specified hypothyroidism: Secondary | ICD-10-CM | POA: Diagnosis not present

## 2021-04-05 DIAGNOSIS — I629 Nontraumatic intracranial hemorrhage, unspecified: Secondary | ICD-10-CM | POA: Diagnosis not present

## 2021-04-05 DIAGNOSIS — M810 Age-related osteoporosis without current pathological fracture: Secondary | ICD-10-CM | POA: Diagnosis not present

## 2021-04-05 DIAGNOSIS — E569 Vitamin deficiency, unspecified: Secondary | ICD-10-CM | POA: Diagnosis not present

## 2021-04-05 DIAGNOSIS — I6340 Cerebral infarction due to embolism of unspecified cerebral artery: Secondary | ICD-10-CM | POA: Diagnosis not present

## 2021-04-05 DIAGNOSIS — D649 Anemia, unspecified: Secondary | ICD-10-CM | POA: Diagnosis not present

## 2021-04-05 DIAGNOSIS — I69392 Facial weakness following cerebral infarction: Secondary | ICD-10-CM | POA: Diagnosis not present

## 2021-04-05 DIAGNOSIS — I2510 Atherosclerotic heart disease of native coronary artery without angina pectoris: Secondary | ICD-10-CM | POA: Diagnosis not present

## 2021-04-05 DIAGNOSIS — Z87440 Personal history of urinary (tract) infections: Secondary | ICD-10-CM | POA: Diagnosis not present

## 2021-04-05 DIAGNOSIS — I69354 Hemiplegia and hemiparesis following cerebral infarction affecting left non-dominant side: Secondary | ICD-10-CM | POA: Diagnosis not present

## 2021-04-05 DIAGNOSIS — R410 Disorientation, unspecified: Secondary | ICD-10-CM | POA: Diagnosis not present

## 2021-04-05 DIAGNOSIS — K5900 Constipation, unspecified: Secondary | ICD-10-CM | POA: Diagnosis not present

## 2021-04-06 DIAGNOSIS — E569 Vitamin deficiency, unspecified: Secondary | ICD-10-CM | POA: Diagnosis not present

## 2021-04-06 DIAGNOSIS — I10 Essential (primary) hypertension: Secondary | ICD-10-CM | POA: Diagnosis not present

## 2021-04-06 DIAGNOSIS — M542 Cervicalgia: Secondary | ICD-10-CM | POA: Diagnosis not present

## 2021-04-06 DIAGNOSIS — Z87440 Personal history of urinary (tract) infections: Secondary | ICD-10-CM | POA: Diagnosis not present

## 2021-04-06 DIAGNOSIS — I6930 Unspecified sequelae of cerebral infarction: Secondary | ICD-10-CM | POA: Diagnosis not present

## 2021-04-06 DIAGNOSIS — E785 Hyperlipidemia, unspecified: Secondary | ICD-10-CM | POA: Diagnosis not present

## 2021-04-06 DIAGNOSIS — R339 Retention of urine, unspecified: Secondary | ICD-10-CM | POA: Diagnosis not present

## 2021-04-13 DIAGNOSIS — I6930 Unspecified sequelae of cerebral infarction: Secondary | ICD-10-CM | POA: Diagnosis not present

## 2021-04-13 DIAGNOSIS — R339 Retention of urine, unspecified: Secondary | ICD-10-CM | POA: Diagnosis not present

## 2021-04-13 DIAGNOSIS — Z87440 Personal history of urinary (tract) infections: Secondary | ICD-10-CM | POA: Diagnosis not present

## 2021-04-14 DIAGNOSIS — N390 Urinary tract infection, site not specified: Secondary | ICD-10-CM | POA: Diagnosis not present

## 2021-04-14 DIAGNOSIS — I4891 Unspecified atrial fibrillation: Secondary | ICD-10-CM | POA: Diagnosis not present

## 2021-04-14 DIAGNOSIS — D649 Anemia, unspecified: Secondary | ICD-10-CM | POA: Diagnosis not present

## 2021-04-15 DIAGNOSIS — Z87440 Personal history of urinary (tract) infections: Secondary | ICD-10-CM | POA: Diagnosis not present

## 2021-04-15 DIAGNOSIS — I6930 Unspecified sequelae of cerebral infarction: Secondary | ICD-10-CM | POA: Diagnosis not present

## 2021-04-18 ENCOUNTER — Ambulatory Visit: Payer: Medicare Other | Admitting: Family Medicine

## 2021-04-19 ENCOUNTER — Ambulatory Visit: Payer: Medicare Other | Admitting: Family Medicine

## 2021-04-20 DIAGNOSIS — I6930 Unspecified sequelae of cerebral infarction: Secondary | ICD-10-CM | POA: Diagnosis not present

## 2021-04-20 DIAGNOSIS — Z87440 Personal history of urinary (tract) infections: Secondary | ICD-10-CM | POA: Diagnosis not present

## 2021-04-20 DIAGNOSIS — R410 Disorientation, unspecified: Secondary | ICD-10-CM | POA: Diagnosis not present

## 2021-04-21 DIAGNOSIS — N390 Urinary tract infection, site not specified: Secondary | ICD-10-CM | POA: Diagnosis not present

## 2021-04-22 DIAGNOSIS — I6930 Unspecified sequelae of cerebral infarction: Secondary | ICD-10-CM | POA: Diagnosis not present

## 2021-04-22 DIAGNOSIS — Z87440 Personal history of urinary (tract) infections: Secondary | ICD-10-CM | POA: Diagnosis not present

## 2021-04-22 DIAGNOSIS — M542 Cervicalgia: Secondary | ICD-10-CM | POA: Diagnosis not present

## 2021-04-22 DIAGNOSIS — I10 Essential (primary) hypertension: Secondary | ICD-10-CM | POA: Diagnosis not present

## 2021-04-26 DIAGNOSIS — I6340 Cerebral infarction due to embolism of unspecified cerebral artery: Secondary | ICD-10-CM | POA: Diagnosis not present

## 2021-04-26 DIAGNOSIS — I69354 Hemiplegia and hemiparesis following cerebral infarction affecting left non-dominant side: Secondary | ICD-10-CM | POA: Diagnosis not present

## 2021-04-28 DIAGNOSIS — Z7982 Long term (current) use of aspirin: Secondary | ICD-10-CM | POA: Diagnosis not present

## 2021-04-28 DIAGNOSIS — M810 Age-related osteoporosis without current pathological fracture: Secondary | ICD-10-CM | POA: Diagnosis not present

## 2021-04-28 DIAGNOSIS — I69354 Hemiplegia and hemiparesis following cerebral infarction affecting left non-dominant side: Secondary | ICD-10-CM | POA: Diagnosis not present

## 2021-04-28 DIAGNOSIS — Z9181 History of falling: Secondary | ICD-10-CM | POA: Diagnosis not present

## 2021-04-28 DIAGNOSIS — I10 Essential (primary) hypertension: Secondary | ICD-10-CM | POA: Diagnosis not present

## 2021-04-28 DIAGNOSIS — I2510 Atherosclerotic heart disease of native coronary artery without angina pectoris: Secondary | ICD-10-CM | POA: Diagnosis not present

## 2021-04-28 DIAGNOSIS — N390 Urinary tract infection, site not specified: Secondary | ICD-10-CM | POA: Diagnosis not present

## 2021-04-28 DIAGNOSIS — F32A Depression, unspecified: Secondary | ICD-10-CM | POA: Diagnosis not present

## 2021-04-28 DIAGNOSIS — I4891 Unspecified atrial fibrillation: Secondary | ICD-10-CM | POA: Diagnosis not present

## 2021-04-28 DIAGNOSIS — M542 Cervicalgia: Secondary | ICD-10-CM | POA: Diagnosis not present

## 2021-04-28 DIAGNOSIS — Z87440 Personal history of urinary (tract) infections: Secondary | ICD-10-CM | POA: Diagnosis not present

## 2021-04-28 DIAGNOSIS — E039 Hypothyroidism, unspecified: Secondary | ICD-10-CM | POA: Diagnosis not present

## 2021-04-28 DIAGNOSIS — E785 Hyperlipidemia, unspecified: Secondary | ICD-10-CM | POA: Diagnosis not present

## 2021-04-28 DIAGNOSIS — I6340 Cerebral infarction due to embolism of unspecified cerebral artery: Secondary | ICD-10-CM | POA: Diagnosis not present

## 2021-04-29 ENCOUNTER — Telehealth: Payer: Self-pay | Admitting: *Deleted

## 2021-04-29 NOTE — Telephone Encounter (Signed)
I cannot advise of this without an appointment. If symptoms worsen over the weekend I recommend urgent care.

## 2021-04-29 NOTE — Telephone Encounter (Signed)
VM from [facility] w/ [facility] Pt is c/o urinary frequency and foul odor has appt on Monday w/ Dr. Warrick Parisian Has cipro at home, can she take this until she is seen on Monday Please advise

## 2021-05-02 ENCOUNTER — Encounter: Payer: Self-pay | Admitting: Family Medicine

## 2021-05-02 ENCOUNTER — Other Ambulatory Visit: Payer: Self-pay

## 2021-05-02 ENCOUNTER — Ambulatory Visit (INDEPENDENT_AMBULATORY_CARE_PROVIDER_SITE_OTHER): Payer: Medicare Other | Admitting: Family Medicine

## 2021-05-02 VITALS — BP 132/70 | HR 115 | Temp 98.0°F | Resp 20 | Ht 64.0 in | Wt 153.1 lb

## 2021-05-02 DIAGNOSIS — E7800 Pure hypercholesterolemia, unspecified: Secondary | ICD-10-CM | POA: Diagnosis not present

## 2021-05-02 DIAGNOSIS — R413 Other amnesia: Secondary | ICD-10-CM | POA: Diagnosis not present

## 2021-05-02 DIAGNOSIS — N3010 Interstitial cystitis (chronic) without hematuria: Secondary | ICD-10-CM | POA: Diagnosis not present

## 2021-05-02 DIAGNOSIS — I10 Essential (primary) hypertension: Secondary | ICD-10-CM

## 2021-05-02 DIAGNOSIS — R634 Abnormal weight loss: Secondary | ICD-10-CM

## 2021-05-02 DIAGNOSIS — Z87440 Personal history of urinary (tract) infections: Secondary | ICD-10-CM | POA: Diagnosis not present

## 2021-05-02 DIAGNOSIS — R630 Anorexia: Secondary | ICD-10-CM

## 2021-05-02 DIAGNOSIS — F4323 Adjustment disorder with mixed anxiety and depressed mood: Secondary | ICD-10-CM

## 2021-05-02 MED ORDER — LEVOTHYROXINE SODIUM 50 MCG PO TABS: 50.0000 ug | ORAL_TABLET | Freq: Every day | ORAL | 3 refills | Status: DC

## 2021-05-02 MED ORDER — AMLODIPINE BESYLATE 5 MG PO TABS: 1.0000 | ORAL_TABLET | Freq: Every day | ORAL | 3 refills | Status: DC

## 2021-05-02 MED ORDER — FAMOTIDINE 20 MG PO TABS: 20.0000 mg | ORAL_TABLET | Freq: Two times a day (BID) | ORAL | 3 refills | Status: DC

## 2021-05-02 MED ORDER — BOOST PLUS PO LIQD: 237.0000 mL | Freq: Two times a day (BID) | ORAL | 3 refills | Status: DC

## 2021-05-02 MED ORDER — BETHANECHOL CHLORIDE 10 MG PO TABS: 10.0000 mg | ORAL_TABLET | Freq: Three times a day (TID) | ORAL | 3 refills | Status: DC

## 2021-05-02 MED ORDER — HYDROCHLOROTHIAZIDE 25 MG PO TABS: 1.0000 | ORAL_TABLET | Freq: Every day | ORAL | 3 refills | Status: DC

## 2021-05-02 MED ORDER — LORAZEPAM 0.5 MG PO TABS: 0.5000 mg | ORAL_TABLET | Freq: Every evening | ORAL | 3 refills | Status: DC | PRN

## 2021-05-02 NOTE — Telephone Encounter (Signed)
Pt seen dettinger today

## 2021-05-02 NOTE — Progress Notes (Signed)
BP 132/70   Pulse (!) 115   Temp 98 F (36.7 C) (Temporal)   Resp 20   Ht _0  (1.626 m)   Wt 153 lb 2 oz (69.5 kg)   LMP 03/21/1971   BMI 26.28 kg/m    Subjective:   Patient ID: Emily Wagner, female    DOB: 01-08-1933, 87 y.o.   MRN: [id number]  HPI: Emily Wagner is a 87 y.o. female presenting on 05/02/2021 for Dysuria and Medical Management of Chronic Issues   HPI Hyperlipidemia Patient is coming in for recheck of his hyperlipidemia. The patient is currently taking none, intolerant. They deny any issues with myalgias or history of liver damage from it. They deny any focal numbness or weakness or chest pain.   Hypertension Patient is currently on amlodipine and hctz, and their blood pressure today is 132/70. Patient denies any lightheadedness or dizziness. Patient denies headaches, blurred vision, chest pains, shortness of breath, or weakness. Denies any side effects from medication and is content with current medication.   Anxiety Patient is coming in with anxiety recheck.  She currently takes lorazepam and has been taking it we will refill it.  She has been having more confusion recently and they started gabapentin after the hospitalization, we will discontinue the gabapentin.  Dysuria and urinary confusion. Patient has been having increased dysuria and increased confusion per daughter also been having some possible hallucinations and seeing things.  They would like to do a urine today but she was unable to leave 1 and will bring her back but also she has started a new medicine and gabapentin  Patient continues to have increased confusion has been worsening and they keep thinking is related to UTI but seems like it chronic issue  Relevant past medical, surgical, family and social history reviewed and updated as indicated. Interim medical history since our last visit reviewed. Allergies and medications reviewed and updated.  Review of Systems  Constitutional:  Negative for chills and fever.  Eyes: Negative for redness and visual disturbance.  Respiratory: Negative for chest tightness and shortness of breath.   Cardiovascular: Negative for chest pain and leg swelling.  Musculoskeletal: Negative for back pain and gait problem.  Skin: Negative for rash.  Neurological: Negative for light-headedness and headaches.  Psychiatric/Behavioral: Positive for confusion, dysphoric mood and sleep disturbance. Negative for agitation, behavioral problems, self-injury and suicidal ideas. The patient is nervous/anxious.   All other systems reviewed and are negative.   Per HPI unless specifically indicated above   Allergies as of 05/02/2021      Reactions   Nitrofurantoin Other (See Comments)   Liver failure   Nsaids Other (See Comments)   Kidney failure   Other Other (See Comments)   Most antibiotics require supervision per patient   Flonase [fluticasone Propionate] Other (See Comments)   headache   Prevnar 13 [pneumococcal 13-val Conj Vacc] Other (See Comments)   Nerve damage in left arm   Beta Adrenergic Blockers    Nortriptyline Hcl Other (See Comments)   States she felt like worms where crawling under her skin.   Potassium-containing Compounds    Rash and raw areas / sore breast    Alendronate Sodium Other (See Comments)   Ciprofloxacin Other (See Comments)   Patient denies allergy--"I take Cipro for bladder infections"   Codeine Other (See Comments)   Crestor [rosuvastatin Calcium] Other (See Comments)   Darvon Other (See Comments)   Lipitor [atorvastatin Calcium] Other (See Comments)  Pravachol Other (See Comments)   Propranolol Hcl Other (See Comments)   Unknown   Sulfa Antibiotics Other (See Comments)   Sulfamethoxazole Other (See Comments)   Unknown   Ultram [tramadol Hcl] Itching   Welchol [colesevelam Hcl] Other (See Comments)   Zetia [ezetimibe] Other (See Comments)   Zocor [simvastatin] Other (See Comments)      Medication List        Accurate as of May 02, 2021  3:24 PM. If you have any questions, ask your nurse or doctor.        STOP taking these medications   ciprofloxacin 500 MG tablet Commonly known as: Cipro Stopped by: [name] Aubriana Ravelo, MD   gabapentin 100 MG capsule Commonly known as: NEURONTIN Stopped by: Worthy Rancher, MD   predniSONE 20 MG tablet Commonly known as: DELTASONE Stopped by: [name] Wandell Scullion, MD     TAKE these medications   acetaminophen 325 MG tablet Commonly known as: TYLENOL Take 325 mg by mouth every 6 (six) hours as needed.   amLODipine 5 MG tablet Commonly known as: NORVASC Take 1 tablet (5 mg total) by mouth daily.   aspirin EC 81 MG tablet Take 81 mg by mouth daily.   bethanechol 10 MG tablet Commonly known as: URECHOLINE Take 1 tablet (10 mg total) by mouth 3 (three) times daily.   calcium carbonate 500 MG chewable tablet Commonly known as: TUMS - dosed in mg elemental calcium Chew 1 tablet by mouth daily.   cholecalciferol 25 MCG (1000 UNIT) tablet Commonly known as: VITAMIN D3 Take 1,000 Units by mouth daily. What changed: Another medication with the same name was removed. Continue taking this medication, and follow the directions you see here. Changed by: [name] Keigan Girten, MD   famotidine 20 MG tablet Commonly known as: PEPCID Take 1 tablet (20 mg total) by mouth 2 (two) times daily.   hydrochlorothiazide 25 MG tablet Commonly known as: HYDRODIURIL Take 1 tablet (25 mg total) by mouth daily.   lactose free nutrition Liqd Take 237 mLs by mouth 2 (two) times daily between meals. Started by: Worthy Rancher, MD   levothyroxine 50 MCG tablet Commonly known as: SYNTHROID Take 1 tablet (50 mcg total) by mouth daily before breakfast.   LORazepam 0.5 MG tablet Commonly known as: ATIVAN Take 1-2 tablets (0.5-1 mg total) by mouth at bedtime as needed. What changed:   how much to take  when to take this Changed by: Worthy Rancher, MD   PRESERVISION AREDS 2 PO Take 1 capsule by mouth at bedtime.   Salonpas 3.12-02-08 % Ptch Generic drug: Camphor-Menthol-Methyl Sal Apply topically.   senna 8.6 MG Tabs tablet Commonly known as: SENOKOT Take 1 tablet by mouth.   tiZANidine 4 MG tablet Commonly known as: Zanaflex Take 0.5 tablets (2 mg total) by mouth every 8 (eight) hours as needed for muscle spasms.   VITAMIN B-12 PO Take 600 mcg by mouth every morning.        Objective:   BP 132/70   Pulse (!) 115   Temp 98 F (36.7 C) (Temporal)   Resp 20   Ht _0  (1.626 m)   Wt 153 lb 2 oz (69.5 kg)   LMP 03/21/1971   BMI 26.28 kg/m   Wt Readings from Last 3 Encounters:  05/02/21 153 lb 2 oz (69.5 kg)  03/27/21 164 lb 14.5 oz (74.8 kg)  12/23/20 164 lb (74.4 kg)    Physical Exam Vitals and  nursing note reviewed.  Constitutional:      General: She is not in acute distress.    Appearance: She is well-developed. She is not diaphoretic.  Eyes:     Conjunctiva/sclera: Conjunctivae normal.  Cardiovascular:     Rate and Rhythm: Normal rate and regular rhythm.     Heart sounds: Normal heart sounds. No murmur heard.   Pulmonary:     Effort: Pulmonary effort is normal. No respiratory distress.     Breath sounds: Normal breath sounds. No wheezing.  Musculoskeletal:        General: No tenderness. Normal range of motion.  Skin:    General: Skin is warm and dry.     Findings: No rash.  Neurological:     Mental Status: She is alert and oriented to person, place, and time.     Coordination: Coordination normal.  Psychiatric:        Behavior: Behavior normal.       Assessment & Plan:   Problem List Items Addressed This Visit      Cardiovascular and Mediastinum   Essential hypertension   Relevant Medications   amLODipine (NORVASC) 5 MG tablet   hydrochlorothiazide (HYDRODIURIL) 25 MG tablet   levothyroxine (SYNTHROID) 50 MCG tablet   Other Relevant Orders   CMP14+EGFR   TSH      Genitourinary   Interstitial cystitis   Relevant Medications   famotidine (PEPCID) 20 MG tablet     Other   HYPERCHOLESTEROLEMIA   Relevant Medications   amLODipine (NORVASC) 5 MG tablet   hydrochlorothiazide (HYDRODIURIL) 25 MG tablet   Other Relevant Orders   Lipid panel   Adjustment disorder with mixed anxiety and depressed mood   Relevant Medications   LORazepam (ATIVAN) 0.5 MG tablet   Other Relevant Orders   Ambulatory referral to [facility]    Other Visit Diagnoses    Memory change    -  Primary   Relevant Orders   CBC with Differential/Platelet   CMP14+EGFR   Lipid panel   TSH   Ambulatory referral to Home Health   History of urinary tract infection       Relevant Orders   Urine Culture   Urinalysis, Complete   Weight loss       Relevant Medications   lactose free nutrition (BOOST PLUS) LIQD   Other Relevant Orders   CBC with Differential/Platelet   Ambulatory referral to Home Health   Decreased appetite       Relevant Medications   lactose free nutrition (BOOST PLUS) LIQD   Other Relevant Orders   Ambulatory referral to [facility] 40 minutes with patient on counseling and medication management after rehab facility discharge. Follow up plan: Return in about 4 months (around 09/01/2021), or if symptoms worsen or fail to improve, for anxiety and htn and cholesterol and hypertension recheck..  Counseling provided for all of the vaccine components Orders Placed This Encounter  Procedures  . Urine Culture  . Urinalysis, Complete  . CBC with Differential/Platelet  . CMP14+EGFR  . Lipid panel  . TSH  . Ambulatory referral to [place], MD [facility] 05/02/2021, 3:24 PM

## 2021-05-03 ENCOUNTER — Telehealth: Payer: Self-pay | Admitting: Family Medicine

## 2021-05-03 ENCOUNTER — Other Ambulatory Visit: Payer: Medicare Other

## 2021-05-03 DIAGNOSIS — Z7689 Persons encountering health services in other specified circumstances: Secondary | ICD-10-CM | POA: Diagnosis not present

## 2021-05-03 DIAGNOSIS — I2510 Atherosclerotic heart disease of native coronary artery without angina pectoris: Secondary | ICD-10-CM | POA: Diagnosis not present

## 2021-05-03 DIAGNOSIS — I10 Essential (primary) hypertension: Secondary | ICD-10-CM | POA: Diagnosis not present

## 2021-05-03 DIAGNOSIS — E039 Hypothyroidism, unspecified: Secondary | ICD-10-CM | POA: Diagnosis not present

## 2021-05-03 DIAGNOSIS — R8290 Unspecified abnormal findings in urine: Secondary | ICD-10-CM | POA: Diagnosis not present

## 2021-05-03 DIAGNOSIS — F32A Depression, unspecified: Secondary | ICD-10-CM | POA: Diagnosis not present

## 2021-05-03 DIAGNOSIS — Z87440 Personal history of urinary (tract) infections: Secondary | ICD-10-CM | POA: Diagnosis not present

## 2021-05-03 DIAGNOSIS — E785 Hyperlipidemia, unspecified: Secondary | ICD-10-CM | POA: Diagnosis not present

## 2021-05-03 DIAGNOSIS — I4891 Unspecified atrial fibrillation: Secondary | ICD-10-CM | POA: Diagnosis not present

## 2021-05-03 DIAGNOSIS — N390 Urinary tract infection, site not specified: Secondary | ICD-10-CM | POA: Diagnosis not present

## 2021-05-03 DIAGNOSIS — M810 Age-related osteoporosis without current pathological fracture: Secondary | ICD-10-CM | POA: Diagnosis not present

## 2021-05-03 DIAGNOSIS — M542 Cervicalgia: Secondary | ICD-10-CM | POA: Diagnosis not present

## 2021-05-03 DIAGNOSIS — Z9181 History of falling: Secondary | ICD-10-CM | POA: Diagnosis not present

## 2021-05-03 DIAGNOSIS — I69354 Hemiplegia and hemiparesis following cerebral infarction affecting left non-dominant side: Secondary | ICD-10-CM | POA: Diagnosis not present

## 2021-05-03 DIAGNOSIS — Z7982 Long term (current) use of aspirin: Secondary | ICD-10-CM | POA: Diagnosis not present

## 2021-05-03 DIAGNOSIS — I6340 Cerebral infarction due to embolism of unspecified cerebral artery: Secondary | ICD-10-CM | POA: Diagnosis not present

## 2021-05-03 LAB — URINALYSIS, COMPLETE
Bilirubin, UA: NEGATIVE
Glucose, UA: NEGATIVE
Leukocytes,UA: NEGATIVE
Nitrite, UA: NEGATIVE
Protein,UA: NEGATIVE
RBC, UA: NEGATIVE
Specific Gravity, UA: 1.025 (ref 1.005–1.030)
Urobilinogen, Ur: 0.2 mg/dL (ref 0.2–1.0)
pH, UA: 5.5 (ref 5.0–7.5)

## 2021-05-03 LAB — MICROSCOPIC EXAMINATION
Bacteria, UA: NONE SEEN
RBC, Urine: NONE SEEN /hpf (ref 0–2)

## 2021-05-03 NOTE — Telephone Encounter (Signed)
Pt had an appt 6/6 with Dr Warrick Parisian and forgot to mention that she would like to have a shot in her knee. She said that she has gotten one before in out office and wanted to see what she needed to do about getting another. Daughter thinks that it was a cortisone shot but is unsure. Please call back and advise.

## 2021-05-03 NOTE — Telephone Encounter (Signed)
Would she need to schedule another appointment ?

## 2021-05-04 DIAGNOSIS — I2510 Atherosclerotic heart disease of native coronary artery without angina pectoris: Secondary | ICD-10-CM | POA: Diagnosis not present

## 2021-05-04 DIAGNOSIS — I10 Essential (primary) hypertension: Secondary | ICD-10-CM | POA: Diagnosis not present

## 2021-05-04 DIAGNOSIS — Z7982 Long term (current) use of aspirin: Secondary | ICD-10-CM | POA: Diagnosis not present

## 2021-05-04 DIAGNOSIS — Z9181 History of falling: Secondary | ICD-10-CM | POA: Diagnosis not present

## 2021-05-04 DIAGNOSIS — M810 Age-related osteoporosis without current pathological fracture: Secondary | ICD-10-CM | POA: Diagnosis not present

## 2021-05-04 DIAGNOSIS — I4891 Unspecified atrial fibrillation: Secondary | ICD-10-CM | POA: Diagnosis not present

## 2021-05-04 DIAGNOSIS — M542 Cervicalgia: Secondary | ICD-10-CM | POA: Diagnosis not present

## 2021-05-04 DIAGNOSIS — N390 Urinary tract infection, site not specified: Secondary | ICD-10-CM | POA: Diagnosis not present

## 2021-05-04 DIAGNOSIS — I69354 Hemiplegia and hemiparesis following cerebral infarction affecting left non-dominant side: Secondary | ICD-10-CM | POA: Diagnosis not present

## 2021-05-04 DIAGNOSIS — I6340 Cerebral infarction due to embolism of unspecified cerebral artery: Secondary | ICD-10-CM | POA: Diagnosis not present

## 2021-05-04 DIAGNOSIS — E785 Hyperlipidemia, unspecified: Secondary | ICD-10-CM | POA: Diagnosis not present

## 2021-05-04 DIAGNOSIS — E039 Hypothyroidism, unspecified: Secondary | ICD-10-CM | POA: Diagnosis not present

## 2021-05-04 DIAGNOSIS — Z87440 Personal history of urinary (tract) infections: Secondary | ICD-10-CM | POA: Diagnosis not present

## 2021-05-04 DIAGNOSIS — F32A Depression, unspecified: Secondary | ICD-10-CM | POA: Diagnosis not present

## 2021-05-04 NOTE — Telephone Encounter (Signed)
Yes she would need an appointment for steroid knee injections

## 2021-05-04 NOTE — Telephone Encounter (Signed)
Pt is scheduled this Friday, June 10th at 2:10 to discuss knee injection.

## 2021-05-06 ENCOUNTER — Encounter: Payer: Self-pay | Admitting: Family Medicine

## 2021-05-06 ENCOUNTER — Ambulatory Visit (INDEPENDENT_AMBULATORY_CARE_PROVIDER_SITE_OTHER): Payer: Medicare Other | Admitting: Family Medicine

## 2021-05-06 ENCOUNTER — Other Ambulatory Visit: Payer: Self-pay

## 2021-05-06 VITALS — BP 138/81 | HR 99 | Ht 64.0 in | Wt 152.0 lb

## 2021-05-06 DIAGNOSIS — Z7982 Long term (current) use of aspirin: Secondary | ICD-10-CM | POA: Diagnosis not present

## 2021-05-06 DIAGNOSIS — Z87440 Personal history of urinary (tract) infections: Secondary | ICD-10-CM | POA: Diagnosis not present

## 2021-05-06 DIAGNOSIS — F32A Depression, unspecified: Secondary | ICD-10-CM | POA: Diagnosis not present

## 2021-05-06 DIAGNOSIS — E039 Hypothyroidism, unspecified: Secondary | ICD-10-CM | POA: Diagnosis not present

## 2021-05-06 DIAGNOSIS — R609 Edema, unspecified: Secondary | ICD-10-CM | POA: Diagnosis not present

## 2021-05-06 DIAGNOSIS — M542 Cervicalgia: Secondary | ICD-10-CM | POA: Diagnosis not present

## 2021-05-06 DIAGNOSIS — Z9181 History of falling: Secondary | ICD-10-CM | POA: Diagnosis not present

## 2021-05-06 DIAGNOSIS — I2510 Atherosclerotic heart disease of native coronary artery without angina pectoris: Secondary | ICD-10-CM | POA: Diagnosis not present

## 2021-05-06 DIAGNOSIS — M1712 Unilateral primary osteoarthritis, left knee: Secondary | ICD-10-CM

## 2021-05-06 DIAGNOSIS — M810 Age-related osteoporosis without current pathological fracture: Secondary | ICD-10-CM | POA: Diagnosis not present

## 2021-05-06 DIAGNOSIS — I6340 Cerebral infarction due to embolism of unspecified cerebral artery: Secondary | ICD-10-CM | POA: Diagnosis not present

## 2021-05-06 DIAGNOSIS — N390 Urinary tract infection, site not specified: Secondary | ICD-10-CM | POA: Diagnosis not present

## 2021-05-06 DIAGNOSIS — I69354 Hemiplegia and hemiparesis following cerebral infarction affecting left non-dominant side: Secondary | ICD-10-CM | POA: Diagnosis not present

## 2021-05-06 DIAGNOSIS — I4891 Unspecified atrial fibrillation: Secondary | ICD-10-CM | POA: Diagnosis not present

## 2021-05-06 DIAGNOSIS — I10 Essential (primary) hypertension: Secondary | ICD-10-CM | POA: Diagnosis not present

## 2021-05-06 DIAGNOSIS — E785 Hyperlipidemia, unspecified: Secondary | ICD-10-CM | POA: Diagnosis not present

## 2021-05-06 MED ORDER — METHYLPREDNISOLONE ACETATE 40 MG/ML IJ SUSP: 80.0000 mg | Freq: Once | INTRAMUSCULAR | Status: DC

## 2021-05-06 NOTE — Progress Notes (Signed)
BP 138/81   Pulse 99   Ht 5\' 4"  (1.626 m)   Wt 152 lb (68.9 kg)   LMP 03/21/1971   BMI 26.09 kg/m    Subjective:   Patient ID: Emily Wagner, female    DOB: [birth year], 87 y.o.   MRN: [id number]  HPI: Emily Wagner is a 87 y.o. female presenting on 05/06/2021 for Knee Pain   HPI Patient is coming in complaining of left knee pain that is been bothering her.  She does fight this before but since her stroke she has been fighting it more.  She is mostly wheelchair-bound but it is bothering her enough that she wants to come in and get an injection.  She is working with home health and physical therapy to try and get her up out of her chair more but she is not getting out of her chair much.  She is mostly chair bound and because of that she is getting a lot more swelling in her legs.  She is on a diuretic but it is not helping as much.  Relevant past medical, surgical, family and social history reviewed and updated as indicated. Interim medical history since our last visit reviewed. Allergies and medications reviewed and updated.  Review of Systems  Constitutional:  Negative for chills and fever.  Respiratory:  Negative for cough, shortness of breath and wheezing.   Cardiovascular:  Positive for leg swelling. Negative for chest pain and palpitations.  Musculoskeletal:  Positive for arthralgias and joint swelling. Negative for back pain.  Skin:  Negative for color change.  Psychiatric/Behavioral:  Positive for confusion.    Per HPI unless specifically indicated above   Allergies as of 05/06/2021       Reactions   Nitrofurantoin Other (See Comments)   Liver failure   Nsaids Other (See Comments)   Kidney failure   Other Other (See Comments)   Most antibiotics require supervision per patient   Flonase [fluticasone Propionate] Other (See Comments)   headache   Prevnar 13 [pneumococcal 13-val Conj Vacc] Other (See Comments)   Nerve damage in left arm   Beta Adrenergic  Blockers    Nortriptyline Hcl Other (See Comments)   States she felt like worms where crawling under her skin.   Potassium-containing Compounds    Rash and raw areas / sore breast    Alendronate Sodium Other (See Comments)   Ciprofloxacin Other (See Comments)   Patient denies allergy--"I take Cipro for bladder infections"   Codeine Other (See Comments)   Crestor [rosuvastatin Calcium] Other (See Comments)   Darvon Other (See Comments)   Lipitor [atorvastatin Calcium] Other (See Comments)   Pravachol Other (See Comments)   Propranolol Hcl Other (See Comments)   Unknown   Sulfa Antibiotics Other (See Comments)   Sulfamethoxazole Other (See Comments)   Unknown   Ultram [tramadol Hcl] Itching   Welchol [colesevelam Hcl] Other (See Comments)   Zetia [ezetimibe] Other (See Comments)   Zocor [simvastatin] Other (See Comments)        Medication List        Accurate as of May 06, 2021  2:52 PM. If you have any questions, ask your nurse or doctor.          acetaminophen 325 MG tablet Commonly known as: TYLENOL Take 325 mg by mouth every 6 (six) hours as needed.   amLODipine 5 MG tablet Commonly known as: NORVASC Take 1 tablet (5 mg total) by mouth daily.  aspirin EC 81 MG tablet Take 81 mg by mouth daily.   bethanechol 10 MG tablet Commonly known as: URECHOLINE Take 1 tablet (10 mg total) by mouth 3 (three) times daily.   calcium carbonate 500 MG chewable tablet Commonly known as: TUMS - dosed in mg elemental calcium Chew 1 tablet by mouth daily.   cholecalciferol 25 MCG (1000 UNIT) tablet Commonly known as: VITAMIN D3 Take 1,000 Units by mouth daily.   famotidine 20 MG tablet Commonly known as: PEPCID Take 1 tablet (20 mg total) by mouth 2 (two) times daily.   hydrochlorothiazide 25 MG tablet Commonly known as: HYDRODIURIL Take 1 tablet (25 mg total) by mouth daily.   lactose free nutrition Liqd Take 237 mLs by mouth 2 (two) times daily between meals.    levothyroxine 50 MCG tablet Commonly known as: SYNTHROID Take 1 tablet (50 mcg total) by mouth daily before breakfast.   LORazepam 0.5 MG tablet Commonly known as: ATIVAN Take 1-2 tablets (0.5-1 mg total) by mouth at bedtime as needed.   PRESERVISION AREDS 2 PO Take 1 capsule by mouth at bedtime.   Salonpas 3.12-02-08 % Ptch Generic drug: Camphor-Menthol-Methyl Sal Apply topically.   senna 8.6 MG Tabs tablet Commonly known as: SENOKOT Take 1 tablet by mouth.   tiZANidine 4 MG tablet Commonly known as: Zanaflex Take 0.5 tablets (2 mg total) by mouth every 8 (eight) hours as needed for muscle spasms.   VITAMIN B-12 PO Take 600 mcg by mouth every morning.         Objective:   BP 138/81   Pulse 99   Ht 5\' 4"  (1.626 m)   Wt 152 lb (68.9 kg)   LMP 03/21/1971   BMI 26.09 kg/m   Wt Readings from Last 3 Encounters:  05/06/21 152 lb (68.9 kg)  05/02/21 153 lb 2 oz (69.5 kg)  03/27/21 164 lb 14.5 oz (74.8 kg)    Physical Exam Vitals and nursing note reviewed.  Musculoskeletal:        General: Swelling (2+ pitting edema bilateral lower extremities) and tenderness (Medial tenderness on left knee) present.  Neurological:     Mental Status: She is alert.    Knee injection: Consent form signed. Risk factors of bleeding and infection discussed with patient and patient is agreeable towards injection. Patient prepped with Betadine. Lateral approach towards injection used. Injected 80 mg of Depo-Medrol and 1 mL of 2% lidocaine. Patient tolerated procedure well and no side effects from noted. Minimal to no bleeding. Simple bandage applied after.   Assessment & Plan:   Problem List Items Addressed This Visit   None Visit Diagnoses     Primary osteoarthritis of left knee    -  Primary   Relevant Medications   methylPREDNISolone acetate (DEPO-MEDROL) injection 80 mg (Start on 05/06/2021  3:00 PM)   Peripheral edema           Recommend compression stockings for peripheral  edema Follow up plan: Return in about 3 months (around 08/06/2021), or if symptoms worsen or fail to improve, for Follow-up chronic issues.  Counseling provided for all of the vaccine components No orders of the defined types were placed in this encounter.   Caryl Pina, MD [facility] 05/06/2021, 2:52 PM

## 2021-05-09 ENCOUNTER — Other Ambulatory Visit: Payer: Self-pay | Admitting: Family Medicine

## 2021-05-09 DIAGNOSIS — F32A Depression, unspecified: Secondary | ICD-10-CM | POA: Diagnosis not present

## 2021-05-09 DIAGNOSIS — Z7982 Long term (current) use of aspirin: Secondary | ICD-10-CM | POA: Diagnosis not present

## 2021-05-09 DIAGNOSIS — E039 Hypothyroidism, unspecified: Secondary | ICD-10-CM | POA: Diagnosis not present

## 2021-05-09 DIAGNOSIS — N390 Urinary tract infection, site not specified: Secondary | ICD-10-CM | POA: Diagnosis not present

## 2021-05-09 DIAGNOSIS — I6340 Cerebral infarction due to embolism of unspecified cerebral artery: Secondary | ICD-10-CM | POA: Diagnosis not present

## 2021-05-09 DIAGNOSIS — I69354 Hemiplegia and hemiparesis following cerebral infarction affecting left non-dominant side: Secondary | ICD-10-CM | POA: Diagnosis not present

## 2021-05-09 DIAGNOSIS — M542 Cervicalgia: Secondary | ICD-10-CM | POA: Diagnosis not present

## 2021-05-09 DIAGNOSIS — E785 Hyperlipidemia, unspecified: Secondary | ICD-10-CM | POA: Diagnosis not present

## 2021-05-09 DIAGNOSIS — I10 Essential (primary) hypertension: Secondary | ICD-10-CM | POA: Diagnosis not present

## 2021-05-09 DIAGNOSIS — I4891 Unspecified atrial fibrillation: Secondary | ICD-10-CM | POA: Diagnosis not present

## 2021-05-09 DIAGNOSIS — I2510 Atherosclerotic heart disease of native coronary artery without angina pectoris: Secondary | ICD-10-CM | POA: Diagnosis not present

## 2021-05-09 DIAGNOSIS — N3000 Acute cystitis without hematuria: Secondary | ICD-10-CM

## 2021-05-09 DIAGNOSIS — Z87440 Personal history of urinary (tract) infections: Secondary | ICD-10-CM | POA: Diagnosis not present

## 2021-05-09 DIAGNOSIS — Z9181 History of falling: Secondary | ICD-10-CM | POA: Diagnosis not present

## 2021-05-09 DIAGNOSIS — M810 Age-related osteoporosis without current pathological fracture: Secondary | ICD-10-CM | POA: Diagnosis not present

## 2021-05-09 LAB — URINE CULTURE

## 2021-05-09 MED ORDER — CEPHALEXIN 500 MG PO CAPS: 500.0000 mg | ORAL_CAPSULE | Freq: Four times a day (QID) | ORAL | 0 refills | Status: DC

## 2021-05-09 NOTE — Progress Notes (Signed)
Patient aware of medication change and verbalized understanding.

## 2021-05-10 ENCOUNTER — Telehealth: Payer: Self-pay | Admitting: Family Medicine

## 2021-05-10 NOTE — Telephone Encounter (Signed)
Left message informing daughter that the pt needs to wear the stockings at least 8 hours per day or when ever she is up walking. Informed that pt does not need to sleep in them but may elevate her legs during the night.

## 2021-05-11 ENCOUNTER — Telehealth: Payer: Self-pay | Admitting: Family Medicine

## 2021-05-11 NOTE — Telephone Encounter (Signed)
Per Dr. Warrick Parisian pt can try neosporin, aloe lotion and/or vaseline to the sites. Should keep sites clean and dry. Daughter made aware and will call back with any concerns.

## 2021-05-12 ENCOUNTER — Telehealth: Payer: Self-pay | Admitting: Family Medicine

## 2021-05-12 ENCOUNTER — Other Ambulatory Visit: Payer: Self-pay | Admitting: Cardiology

## 2021-05-12 ENCOUNTER — Other Ambulatory Visit: Payer: Self-pay | Admitting: Family Medicine

## 2021-05-12 DIAGNOSIS — I10 Essential (primary) hypertension: Secondary | ICD-10-CM | POA: Diagnosis not present

## 2021-05-12 DIAGNOSIS — N390 Urinary tract infection, site not specified: Secondary | ICD-10-CM | POA: Diagnosis not present

## 2021-05-12 DIAGNOSIS — I69354 Hemiplegia and hemiparesis following cerebral infarction affecting left non-dominant side: Secondary | ICD-10-CM | POA: Diagnosis not present

## 2021-05-12 DIAGNOSIS — I6340 Cerebral infarction due to embolism of unspecified cerebral artery: Secondary | ICD-10-CM | POA: Diagnosis not present

## 2021-05-12 DIAGNOSIS — E785 Hyperlipidemia, unspecified: Secondary | ICD-10-CM | POA: Diagnosis not present

## 2021-05-12 DIAGNOSIS — Z87440 Personal history of urinary (tract) infections: Secondary | ICD-10-CM | POA: Diagnosis not present

## 2021-05-12 DIAGNOSIS — I4891 Unspecified atrial fibrillation: Secondary | ICD-10-CM | POA: Diagnosis not present

## 2021-05-12 DIAGNOSIS — N3010 Interstitial cystitis (chronic) without hematuria: Secondary | ICD-10-CM

## 2021-05-12 DIAGNOSIS — M810 Age-related osteoporosis without current pathological fracture: Secondary | ICD-10-CM | POA: Diagnosis not present

## 2021-05-12 DIAGNOSIS — I2510 Atherosclerotic heart disease of native coronary artery without angina pectoris: Secondary | ICD-10-CM | POA: Diagnosis not present

## 2021-05-12 DIAGNOSIS — M542 Cervicalgia: Secondary | ICD-10-CM | POA: Diagnosis not present

## 2021-05-12 DIAGNOSIS — F32A Depression, unspecified: Secondary | ICD-10-CM | POA: Diagnosis not present

## 2021-05-12 DIAGNOSIS — E039 Hypothyroidism, unspecified: Secondary | ICD-10-CM | POA: Diagnosis not present

## 2021-05-12 DIAGNOSIS — Z9181 History of falling: Secondary | ICD-10-CM | POA: Diagnosis not present

## 2021-05-12 DIAGNOSIS — Z7982 Long term (current) use of aspirin: Secondary | ICD-10-CM | POA: Diagnosis not present

## 2021-05-12 NOTE — Telephone Encounter (Signed)
Verbal approval given for [place] and social work to go out and see patient.

## 2021-05-13 ENCOUNTER — Telehealth: Payer: Self-pay | Admitting: Family Medicine

## 2021-05-13 DIAGNOSIS — I69354 Hemiplegia and hemiparesis following cerebral infarction affecting left non-dominant side: Secondary | ICD-10-CM | POA: Diagnosis not present

## 2021-05-13 DIAGNOSIS — Z87440 Personal history of urinary (tract) infections: Secondary | ICD-10-CM | POA: Diagnosis not present

## 2021-05-13 DIAGNOSIS — Z7982 Long term (current) use of aspirin: Secondary | ICD-10-CM | POA: Diagnosis not present

## 2021-05-13 DIAGNOSIS — I4891 Unspecified atrial fibrillation: Secondary | ICD-10-CM | POA: Diagnosis not present

## 2021-05-13 DIAGNOSIS — N390 Urinary tract infection, site not specified: Secondary | ICD-10-CM | POA: Diagnosis not present

## 2021-05-13 DIAGNOSIS — E785 Hyperlipidemia, unspecified: Secondary | ICD-10-CM | POA: Diagnosis not present

## 2021-05-13 DIAGNOSIS — I2510 Atherosclerotic heart disease of native coronary artery without angina pectoris: Secondary | ICD-10-CM | POA: Diagnosis not present

## 2021-05-13 DIAGNOSIS — M542 Cervicalgia: Secondary | ICD-10-CM | POA: Diagnosis not present

## 2021-05-13 DIAGNOSIS — M810 Age-related osteoporosis without current pathological fracture: Secondary | ICD-10-CM | POA: Diagnosis not present

## 2021-05-13 DIAGNOSIS — F32A Depression, unspecified: Secondary | ICD-10-CM | POA: Diagnosis not present

## 2021-05-13 DIAGNOSIS — E039 Hypothyroidism, unspecified: Secondary | ICD-10-CM | POA: Diagnosis not present

## 2021-05-13 DIAGNOSIS — I6340 Cerebral infarction due to embolism of unspecified cerebral artery: Secondary | ICD-10-CM | POA: Diagnosis not present

## 2021-05-13 DIAGNOSIS — I10 Essential (primary) hypertension: Secondary | ICD-10-CM | POA: Diagnosis not present

## 2021-05-13 DIAGNOSIS — Z9181 History of falling: Secondary | ICD-10-CM | POA: Diagnosis not present

## 2021-05-13 NOTE — Telephone Encounter (Signed)
Have her do over-the-counter Voltaren, for now I will not want to put any other medication there for the possibility of hurting the kidneys while she is doing the infection.  Make sure he stays hydrated and she can do heating pad and TENS units to help with her back.  Since she just started the antibiotic I would give it more time on the antibiotic to see if it improves.

## 2021-05-13 NOTE — Telephone Encounter (Signed)
Home health states patient was dx with UTI and is on medication. Patient is still having pain and aches and nurse wanted to report that. It is Emily Wagner from [facility]. Please advise.

## 2021-05-13 NOTE — Telephone Encounter (Signed)
Pt is having pain in her lower back. Daughter is unsure if it is coming from the UTI. Pt gave pain an 8 out of 10 yesterday during therapy. Pt is not eating and drinking. Daughter has bought Pedialyte. Pt is currently taking ATB for ecoli. Has about 16 pills left to take.

## 2021-05-13 NOTE — Telephone Encounter (Signed)
Where she having pains and aches from from the burn or from her back or from her joints, what is she having pains and aches and where are they at?

## 2021-05-13 NOTE — Telephone Encounter (Signed)
Pt's daughter informed of Dr. Merita Norton recommendations. She will contact the office back with any concerns.

## 2021-05-16 ENCOUNTER — Telehealth: Payer: Self-pay | Admitting: Family Medicine

## 2021-05-16 NOTE — Telephone Encounter (Signed)
States that patient has been having some lower leg swelling that she would like Dr. Warrick Parisian to look at.  First opening is Wednesday - appointment scheduled.  Will contact us if swelling becomes worse. No fever just swelling and some pain.

## 2021-05-16 NOTE — Telephone Encounter (Signed)
Please call patients daughter [name]). Has questions about patient possibly having a blood clot in left leg between knee and hip.  [phone]

## 2021-05-17 DIAGNOSIS — E785 Hyperlipidemia, unspecified: Secondary | ICD-10-CM | POA: Diagnosis not present

## 2021-05-17 DIAGNOSIS — I6340 Cerebral infarction due to embolism of unspecified cerebral artery: Secondary | ICD-10-CM | POA: Diagnosis not present

## 2021-05-17 DIAGNOSIS — N390 Urinary tract infection, site not specified: Secondary | ICD-10-CM | POA: Diagnosis not present

## 2021-05-17 DIAGNOSIS — F32A Depression, unspecified: Secondary | ICD-10-CM | POA: Diagnosis not present

## 2021-05-17 DIAGNOSIS — I2510 Atherosclerotic heart disease of native coronary artery without angina pectoris: Secondary | ICD-10-CM | POA: Diagnosis not present

## 2021-05-17 DIAGNOSIS — I4891 Unspecified atrial fibrillation: Secondary | ICD-10-CM | POA: Diagnosis not present

## 2021-05-17 DIAGNOSIS — Z9181 History of falling: Secondary | ICD-10-CM | POA: Diagnosis not present

## 2021-05-17 DIAGNOSIS — Z7982 Long term (current) use of aspirin: Secondary | ICD-10-CM | POA: Diagnosis not present

## 2021-05-17 DIAGNOSIS — I69354 Hemiplegia and hemiparesis following cerebral infarction affecting left non-dominant side: Secondary | ICD-10-CM | POA: Diagnosis not present

## 2021-05-17 DIAGNOSIS — M542 Cervicalgia: Secondary | ICD-10-CM | POA: Diagnosis not present

## 2021-05-17 DIAGNOSIS — E039 Hypothyroidism, unspecified: Secondary | ICD-10-CM | POA: Diagnosis not present

## 2021-05-17 DIAGNOSIS — Z87440 Personal history of urinary (tract) infections: Secondary | ICD-10-CM | POA: Diagnosis not present

## 2021-05-17 DIAGNOSIS — I10 Essential (primary) hypertension: Secondary | ICD-10-CM | POA: Diagnosis not present

## 2021-05-17 DIAGNOSIS — M810 Age-related osteoporosis without current pathological fracture: Secondary | ICD-10-CM | POA: Diagnosis not present

## 2021-05-18 ENCOUNTER — Ambulatory Visit (INDEPENDENT_AMBULATORY_CARE_PROVIDER_SITE_OTHER): Payer: Medicare Other | Admitting: Family Medicine

## 2021-05-18 ENCOUNTER — Other Ambulatory Visit: Payer: Self-pay

## 2021-05-18 ENCOUNTER — Encounter: Payer: Self-pay | Admitting: Family Medicine

## 2021-05-18 VITALS — BP 132/83 | HR 99 | Ht 64.0 in

## 2021-05-18 DIAGNOSIS — R350 Frequency of micturition: Secondary | ICD-10-CM | POA: Diagnosis not present

## 2021-05-18 DIAGNOSIS — F4323 Adjustment disorder with mixed anxiety and depressed mood: Secondary | ICD-10-CM | POA: Diagnosis not present

## 2021-05-18 DIAGNOSIS — R609 Edema, unspecified: Secondary | ICD-10-CM

## 2021-05-18 MED ORDER — QUETIAPINE FUMARATE 50 MG PO TABS: 50.0000 mg | ORAL_TABLET | Freq: Every day | ORAL | 3 refills | Status: DC

## 2021-05-18 NOTE — Progress Notes (Signed)
BP 132/83   Pulse 99   Ht 5\' 4"  (1.626 m)   LMP 03/21/1971   SpO2 94%   BMI 26.09 kg/m    Subjective:   Patient ID: Emily Wagner, female    DOB: 1933-09-02, 87 y.o.   MRN: [id number]  HPI: Emily Wagner is a 87 y.o. female presenting on 05/18/2021 for Edema (LLE)   HPI Patient is coming in today discuss anxiety and depression and memory issues. Patient currently takes lorazepam as she is not helping her sleep is much and not helping her anxiety as much.  Her memory and dementia is worsening and that is part of the issue.  She does have some pain in her back and that also keeps her up at night.  Her daughter who is here with her says that she is just getting more hallucinations and more mood swings and having a lot more difficulty with her.  Patient is coming in complaining of left lower extremity edema.  Fine she has been running pressure stockings and her swelling is doing a lot better and now she has complaints that she is urinating all the time and wants to stop the diuretic.  We says she will go ahead and start cutting in half and stop it..  Daughter is concerned about possible hallucinations and wants her tested for urine.  They just barely finished the antibiotic last night so it is a little too soon.  We will recheck in the future.  Except for the frequency she does not have any current symptoms today.  Relevant past medical, surgical, family and social history reviewed and updated as indicated. Interim medical history since our last visit reviewed. Allergies and medications reviewed and updated.  Review of Systems  Constitutional:  Negative for chills and fever.  Eyes:  Negative for redness and visual disturbance.  Respiratory:  Negative for chest tightness and shortness of breath.   Cardiovascular:  Positive for leg swelling. Negative for chest pain.  Musculoskeletal:  Positive for arthralgias and back pain.  Skin:  Negative for rash.  Neurological:  Negative for  dizziness, light-headedness and headaches.  Psychiatric/Behavioral:  Positive for agitation, confusion and sleep disturbance. Negative for behavioral problems, self-injury and suicidal ideas. The patient is nervous/anxious.   All other systems reviewed and are negative.  Per HPI unless specifically indicated above   Allergies as of 05/18/2021       Reactions   Nitrofurantoin Other (See Comments)   Liver failure   Nsaids Other (See Comments)   Kidney failure   Other Other (See Comments)   Most antibiotics require supervision per patient   Flonase [fluticasone Propionate] Other (See Comments)   headache   Prevnar 13 [pneumococcal 13-val Conj Vacc] Other (See Comments)   Nerve damage in left arm   Beta Adrenergic Blockers    Nortriptyline Hcl Other (See Comments)   States she felt like worms where crawling under her skin.   Potassium-containing Compounds    Rash and raw areas / sore breast    Alendronate Sodium Other (See Comments)   Ciprofloxacin Other (See Comments)   Patient denies allergy--"I take Cipro for bladder infections"   Codeine Other (See Comments)   Crestor [rosuvastatin Calcium] Other (See Comments)   Darvon Other (See Comments)   Lipitor [atorvastatin Calcium] Other (See Comments)   Pravachol Other (See Comments)   Propranolol Hcl Other (See Comments)   Unknown   Sulfa Antibiotics Other (See Comments)   Sulfamethoxazole Other (  See Comments)   Unknown   Ultram [tramadol Hcl] Itching   Welchol [colesevelam Hcl] Other (See Comments)   Zetia [ezetimibe] Other (See Comments)   Zocor [simvastatin] Other (See Comments)        Medication List        Accurate as of May 18, 2021  4:34 PM. If you have any questions, ask your nurse or doctor.          acetaminophen 325 MG tablet Commonly known as: TYLENOL Take 325 mg by mouth every 6 (six) hours as needed.   amLODipine 5 MG tablet Commonly known as: NORVASC Take 1 tablet (5 mg total) by mouth daily.    aspirin EC 81 MG tablet Take 81 mg by mouth daily.   bethanechol 10 MG tablet Commonly known as: URECHOLINE Take 1 tablet (10 mg total) by mouth 3 (three) times daily.   calcium carbonate 500 MG chewable tablet Commonly known as: TUMS - dosed in mg elemental calcium Chew 1 tablet by mouth daily.   cephALEXin 500 MG capsule Commonly known as: KEFLEX Take 1 capsule (500 mg total) by mouth 4 (four) times daily.   cholecalciferol 25 MCG (1000 UNIT) tablet Commonly known as: VITAMIN D3 Take 1,000 Units by mouth daily.   famotidine 20 MG tablet Commonly known as: PEPCID TAKE 1 TABLET BY MOUTH  TWICE DAILY   hydrochlorothiazide 25 MG tablet Commonly known as: HYDRODIURIL Take 1 tablet (25 mg total) by mouth daily.   lactose free nutrition Liqd Take 237 mLs by mouth 2 (two) times daily between meals.   levothyroxine 50 MCG tablet Commonly known as: SYNTHROID TAKE 1 TABLET BY MOUTH  DAILY BEFORE BREAKFAST   LORazepam 0.5 MG tablet Commonly known as: ATIVAN Take 1-2 tablets (0.5-1 mg total) by mouth at bedtime as needed.   PRESERVISION AREDS 2 PO Take 1 capsule by mouth at bedtime.   QUEtiapine 50 MG tablet Commonly known as: SEROquel Take 1 tablet (50 mg total) by mouth at bedtime. Started by: [name] Norton Bivins, MD   Salonpas 3.12-02-08 % Ptch Generic drug: Camphor-Menthol-Methyl Sal Apply topically.   senna 8.6 MG Tabs tablet Commonly known as: SENOKOT Take 1 tablet by mouth.   tiZANidine 4 MG tablet Commonly known as: Zanaflex Take 0.5 tablets (2 mg total) by mouth every 8 (eight) hours as needed for muscle spasms.   VITAMIN B-12 PO Take 600 mcg by mouth every morning.         Objective:   BP 132/83   Pulse 99   Ht 5\' 4"  (1.626 m)   LMP 03/21/1971   SpO2 94%   BMI 26.09 kg/m   Wt Readings from Last 3 Encounters:  05/06/21 152 lb (68.9 kg)  05/02/21 153 lb 2 oz (69.5 kg)  03/27/21 164 lb 14.5 oz (74.8 kg)    Physical Exam Vitals and nursing  note reviewed.  Constitutional:      General: She is not in acute distress.    Appearance: She is well-developed. She is not diaphoretic.  Eyes:     Conjunctiva/sclera: Conjunctivae normal.     Pupils: Pupils are equal, round, and reactive to light.  Cardiovascular:     Rate and Rhythm: Normal rate and regular rhythm.     Heart sounds: Normal heart sounds. No murmur heard. Pulmonary:     Effort: Pulmonary effort is normal. No respiratory distress.     Breath sounds: Normal breath sounds. No wheezing.  Musculoskeletal:  General: Swelling (trace) present. No tenderness. Normal range of motion.  Skin:    General: Skin is warm and dry.     Findings: No rash.  Neurological:     Mental Status: She is alert and oriented to person, place, and time.     Coordination: Coordination normal.  Psychiatric:        Behavior: Behavior normal.      Assessment & Plan:   Problem List Items Addressed This Visit       Other   Adjustment disorder with mixed anxiety and depressed mood - Primary   Relevant Medications   QUEtiapine (SEROQUEL) 50 MG tablet   Other Visit Diagnoses     Urinary frequency       Relevant Orders   Urinalysis, Complete   Urine Culture   Peripheral edema           Will start Seroquel to see if helps with her sleep, she will come back sometime next week to leave urine.  Continue other medication as no changes. Follow up plan: Return in about 3 months (around 08/18/2021), or if symptoms worsen or fail to improve, for Anxiety depression recheck.  Counseling provided for all of the vaccine components Orders Placed This Encounter  Procedures   Urine Culture   Urinalysis, Complete    Caryl Pina, MD [facility] 05/18/2021, 4:34 PM

## 2021-05-19 DIAGNOSIS — I10 Essential (primary) hypertension: Secondary | ICD-10-CM | POA: Diagnosis not present

## 2021-05-19 DIAGNOSIS — Z87440 Personal history of urinary (tract) infections: Secondary | ICD-10-CM | POA: Diagnosis not present

## 2021-05-19 DIAGNOSIS — I2510 Atherosclerotic heart disease of native coronary artery without angina pectoris: Secondary | ICD-10-CM | POA: Diagnosis not present

## 2021-05-19 DIAGNOSIS — F32A Depression, unspecified: Secondary | ICD-10-CM | POA: Diagnosis not present

## 2021-05-19 DIAGNOSIS — M810 Age-related osteoporosis without current pathological fracture: Secondary | ICD-10-CM | POA: Diagnosis not present

## 2021-05-19 DIAGNOSIS — I6340 Cerebral infarction due to embolism of unspecified cerebral artery: Secondary | ICD-10-CM | POA: Diagnosis not present

## 2021-05-19 DIAGNOSIS — N390 Urinary tract infection, site not specified: Secondary | ICD-10-CM | POA: Diagnosis not present

## 2021-05-19 DIAGNOSIS — Z7982 Long term (current) use of aspirin: Secondary | ICD-10-CM | POA: Diagnosis not present

## 2021-05-19 DIAGNOSIS — Z9181 History of falling: Secondary | ICD-10-CM | POA: Diagnosis not present

## 2021-05-19 DIAGNOSIS — M542 Cervicalgia: Secondary | ICD-10-CM | POA: Diagnosis not present

## 2021-05-19 DIAGNOSIS — E039 Hypothyroidism, unspecified: Secondary | ICD-10-CM | POA: Diagnosis not present

## 2021-05-19 DIAGNOSIS — I4891 Unspecified atrial fibrillation: Secondary | ICD-10-CM | POA: Diagnosis not present

## 2021-05-19 DIAGNOSIS — E785 Hyperlipidemia, unspecified: Secondary | ICD-10-CM | POA: Diagnosis not present

## 2021-05-19 DIAGNOSIS — I69354 Hemiplegia and hemiparesis following cerebral infarction affecting left non-dominant side: Secondary | ICD-10-CM | POA: Diagnosis not present

## 2021-05-20 DIAGNOSIS — E039 Hypothyroidism, unspecified: Secondary | ICD-10-CM | POA: Diagnosis not present

## 2021-05-20 DIAGNOSIS — F32A Depression, unspecified: Secondary | ICD-10-CM | POA: Diagnosis not present

## 2021-05-20 DIAGNOSIS — Z9181 History of falling: Secondary | ICD-10-CM | POA: Diagnosis not present

## 2021-05-20 DIAGNOSIS — Z7982 Long term (current) use of aspirin: Secondary | ICD-10-CM | POA: Diagnosis not present

## 2021-05-20 DIAGNOSIS — I6340 Cerebral infarction due to embolism of unspecified cerebral artery: Secondary | ICD-10-CM | POA: Diagnosis not present

## 2021-05-20 DIAGNOSIS — Z87440 Personal history of urinary (tract) infections: Secondary | ICD-10-CM | POA: Diagnosis not present

## 2021-05-20 DIAGNOSIS — I2510 Atherosclerotic heart disease of native coronary artery without angina pectoris: Secondary | ICD-10-CM | POA: Diagnosis not present

## 2021-05-20 DIAGNOSIS — I4891 Unspecified atrial fibrillation: Secondary | ICD-10-CM | POA: Diagnosis not present

## 2021-05-20 DIAGNOSIS — N390 Urinary tract infection, site not specified: Secondary | ICD-10-CM | POA: Diagnosis not present

## 2021-05-20 DIAGNOSIS — M542 Cervicalgia: Secondary | ICD-10-CM | POA: Diagnosis not present

## 2021-05-20 DIAGNOSIS — E785 Hyperlipidemia, unspecified: Secondary | ICD-10-CM | POA: Diagnosis not present

## 2021-05-20 DIAGNOSIS — I69354 Hemiplegia and hemiparesis following cerebral infarction affecting left non-dominant side: Secondary | ICD-10-CM | POA: Diagnosis not present

## 2021-05-20 DIAGNOSIS — I10 Essential (primary) hypertension: Secondary | ICD-10-CM | POA: Diagnosis not present

## 2021-05-20 DIAGNOSIS — M810 Age-related osteoporosis without current pathological fracture: Secondary | ICD-10-CM | POA: Diagnosis not present

## 2021-05-24 ENCOUNTER — Telehealth: Payer: Self-pay | Admitting: Family Medicine

## 2021-05-24 ENCOUNTER — Other Ambulatory Visit: Payer: Medicare Other

## 2021-05-24 DIAGNOSIS — I2510 Atherosclerotic heart disease of native coronary artery without angina pectoris: Secondary | ICD-10-CM | POA: Diagnosis not present

## 2021-05-24 DIAGNOSIS — I6340 Cerebral infarction due to embolism of unspecified cerebral artery: Secondary | ICD-10-CM | POA: Diagnosis not present

## 2021-05-24 DIAGNOSIS — R350 Frequency of micturition: Secondary | ICD-10-CM

## 2021-05-24 DIAGNOSIS — E785 Hyperlipidemia, unspecified: Secondary | ICD-10-CM | POA: Diagnosis not present

## 2021-05-24 DIAGNOSIS — I10 Essential (primary) hypertension: Secondary | ICD-10-CM | POA: Diagnosis not present

## 2021-05-24 DIAGNOSIS — E039 Hypothyroidism, unspecified: Secondary | ICD-10-CM | POA: Diagnosis not present

## 2021-05-24 DIAGNOSIS — M810 Age-related osteoporosis without current pathological fracture: Secondary | ICD-10-CM | POA: Diagnosis not present

## 2021-05-24 DIAGNOSIS — Z87440 Personal history of urinary (tract) infections: Secondary | ICD-10-CM | POA: Diagnosis not present

## 2021-05-24 DIAGNOSIS — Z7982 Long term (current) use of aspirin: Secondary | ICD-10-CM | POA: Diagnosis not present

## 2021-05-24 DIAGNOSIS — N390 Urinary tract infection, site not specified: Secondary | ICD-10-CM | POA: Diagnosis not present

## 2021-05-24 DIAGNOSIS — Z9181 History of falling: Secondary | ICD-10-CM | POA: Diagnosis not present

## 2021-05-24 DIAGNOSIS — I4891 Unspecified atrial fibrillation: Secondary | ICD-10-CM | POA: Diagnosis not present

## 2021-05-24 DIAGNOSIS — I69354 Hemiplegia and hemiparesis following cerebral infarction affecting left non-dominant side: Secondary | ICD-10-CM | POA: Diagnosis not present

## 2021-05-24 DIAGNOSIS — F32A Depression, unspecified: Secondary | ICD-10-CM | POA: Diagnosis not present

## 2021-05-24 DIAGNOSIS — M542 Cervicalgia: Secondary | ICD-10-CM | POA: Diagnosis not present

## 2021-05-24 LAB — URINALYSIS, COMPLETE
Bilirubin, UA: NEGATIVE
Glucose, UA: NEGATIVE
Ketones, UA: NEGATIVE
Nitrite, UA: NEGATIVE
Protein,UA: NEGATIVE
Specific Gravity, UA: 1.02 (ref 1.005–1.030)
Urobilinogen, Ur: 1 mg/dL (ref 0.2–1.0)
pH, UA: 7 (ref 5.0–7.5)

## 2021-05-24 LAB — MICROSCOPIC EXAMINATION: WBC, UA: >30 /hpf — AB (ref 0–5)

## 2021-05-24 NOTE — Telephone Encounter (Signed)
I received a call at 10:50 PM in from Arbie Cookey, a speech pathologist who was seeing Emily Wagner somewhat earlier in the evening.  There was no telephone reception at the patient's home.  She was therefore delayed in calling.  However, she is concerned that the patient's blood pressure was elevated to 180/90 when she evaluated her earlier this evening.  The patient was asymptomatic that if she did not have any complaints of chest pain, lateralizing neurologic signs including numbness or weakness of an extremity.  There was no headache reported.  Assessment: Uncontrolled hypertension. Plan: Monitor blood pressure closely through her home health team.  Adjust blood pressure medicine if symptoms continue.  Dr. Warrick Parisian, her PCP, to be notified through this note.  Claretta Fraise, MD

## 2021-05-25 ENCOUNTER — Telehealth: Payer: Self-pay | Admitting: Family Medicine

## 2021-05-25 ENCOUNTER — Ambulatory Visit (INDEPENDENT_AMBULATORY_CARE_PROVIDER_SITE_OTHER): Payer: Medicare Other

## 2021-05-25 ENCOUNTER — Other Ambulatory Visit: Payer: Self-pay

## 2021-05-25 DIAGNOSIS — I69354 Hemiplegia and hemiparesis following cerebral infarction affecting left non-dominant side: Secondary | ICD-10-CM | POA: Diagnosis not present

## 2021-05-25 DIAGNOSIS — M810 Age-related osteoporosis without current pathological fracture: Secondary | ICD-10-CM | POA: Diagnosis not present

## 2021-05-25 DIAGNOSIS — Z7982 Long term (current) use of aspirin: Secondary | ICD-10-CM

## 2021-05-25 DIAGNOSIS — M542 Cervicalgia: Secondary | ICD-10-CM | POA: Diagnosis not present

## 2021-05-25 DIAGNOSIS — Z87440 Personal history of urinary (tract) infections: Secondary | ICD-10-CM | POA: Diagnosis not present

## 2021-05-25 DIAGNOSIS — F419 Anxiety disorder, unspecified: Secondary | ICD-10-CM

## 2021-05-25 DIAGNOSIS — Z9181 History of falling: Secondary | ICD-10-CM

## 2021-05-25 DIAGNOSIS — F32A Depression, unspecified: Secondary | ICD-10-CM | POA: Diagnosis not present

## 2021-05-25 DIAGNOSIS — I4891 Unspecified atrial fibrillation: Secondary | ICD-10-CM

## 2021-05-25 DIAGNOSIS — E039 Hypothyroidism, unspecified: Secondary | ICD-10-CM | POA: Diagnosis not present

## 2021-05-25 DIAGNOSIS — I2510 Atherosclerotic heart disease of native coronary artery without angina pectoris: Secondary | ICD-10-CM

## 2021-05-25 DIAGNOSIS — G14 Postpolio syndrome: Secondary | ICD-10-CM

## 2021-05-25 DIAGNOSIS — I6930 Unspecified sequelae of cerebral infarction: Secondary | ICD-10-CM

## 2021-05-25 DIAGNOSIS — E785 Hyperlipidemia, unspecified: Secondary | ICD-10-CM

## 2021-05-25 DIAGNOSIS — I10 Essential (primary) hypertension: Secondary | ICD-10-CM

## 2021-05-25 DIAGNOSIS — N390 Urinary tract infection, site not specified: Secondary | ICD-10-CM

## 2021-05-25 NOTE — Telephone Encounter (Signed)
I agree have her monitor for a week and then call me in some numbers

## 2021-05-25 NOTE — Telephone Encounter (Addendum)
Tried calling home answer. No answer and no vmail.  Left message on cell number.  Per Melissa,  Pt is requesting an ATB for urine culture.

## 2021-05-26 DIAGNOSIS — M810 Age-related osteoporosis without current pathological fracture: Secondary | ICD-10-CM | POA: Diagnosis not present

## 2021-05-26 DIAGNOSIS — I2510 Atherosclerotic heart disease of native coronary artery without angina pectoris: Secondary | ICD-10-CM | POA: Diagnosis not present

## 2021-05-26 DIAGNOSIS — I10 Essential (primary) hypertension: Secondary | ICD-10-CM | POA: Diagnosis not present

## 2021-05-26 DIAGNOSIS — I6340 Cerebral infarction due to embolism of unspecified cerebral artery: Secondary | ICD-10-CM | POA: Diagnosis not present

## 2021-05-26 DIAGNOSIS — Z9181 History of falling: Secondary | ICD-10-CM | POA: Diagnosis not present

## 2021-05-26 DIAGNOSIS — Z87440 Personal history of urinary (tract) infections: Secondary | ICD-10-CM | POA: Diagnosis not present

## 2021-05-26 DIAGNOSIS — M542 Cervicalgia: Secondary | ICD-10-CM | POA: Diagnosis not present

## 2021-05-26 DIAGNOSIS — Z7982 Long term (current) use of aspirin: Secondary | ICD-10-CM | POA: Diagnosis not present

## 2021-05-26 DIAGNOSIS — E039 Hypothyroidism, unspecified: Secondary | ICD-10-CM | POA: Diagnosis not present

## 2021-05-26 DIAGNOSIS — N390 Urinary tract infection, site not specified: Secondary | ICD-10-CM | POA: Diagnosis not present

## 2021-05-26 DIAGNOSIS — I69354 Hemiplegia and hemiparesis following cerebral infarction affecting left non-dominant side: Secondary | ICD-10-CM | POA: Diagnosis not present

## 2021-05-26 DIAGNOSIS — I4891 Unspecified atrial fibrillation: Secondary | ICD-10-CM | POA: Diagnosis not present

## 2021-05-26 DIAGNOSIS — E785 Hyperlipidemia, unspecified: Secondary | ICD-10-CM | POA: Diagnosis not present

## 2021-05-26 DIAGNOSIS — F32A Depression, unspecified: Secondary | ICD-10-CM | POA: Diagnosis not present

## 2021-05-26 NOTE — Telephone Encounter (Signed)
Daughter aware and verbalizes understanding per dpr.

## 2021-05-26 NOTE — Telephone Encounter (Signed)
Will await culture and then decide on treatment

## 2021-05-28 LAB — URINE CULTURE

## 2021-05-30 DIAGNOSIS — S31809A Unspecified open wound of unspecified buttock, initial encounter: Secondary | ICD-10-CM | POA: Diagnosis not present

## 2021-06-01 ENCOUNTER — Telehealth: Payer: Self-pay | Admitting: Family Medicine

## 2021-06-01 ENCOUNTER — Other Ambulatory Visit: Payer: Self-pay | Admitting: Family Medicine

## 2021-06-01 DIAGNOSIS — N3000 Acute cystitis without hematuria: Secondary | ICD-10-CM

## 2021-06-01 MED ORDER — CEPHALEXIN 500 MG PO CAPS: 500.0000 mg | ORAL_CAPSULE | Freq: Four times a day (QID) | ORAL | 0 refills | Status: DC

## 2021-06-01 NOTE — Telephone Encounter (Signed)
Emily Wagner is calling back to check on the results

## 2021-06-01 NOTE — Progress Notes (Unsigned)
Sent Keflex again, should cover, the E. coli should be covered by this based on the current culture

## 2021-06-01 NOTE — Telephone Encounter (Signed)
Results are back, please review and advise.

## 2021-06-01 NOTE — Telephone Encounter (Signed)
Pt had a urine culture ordered last week (6/29) and they have not gotten the results, no notes in chart after saying that a culture was ordered and they are waiting for results. Please call back and advise.

## 2021-06-01 NOTE — Telephone Encounter (Signed)
Dr. Warrick Parisian,  Looks like culture grew E. Coli. Pt's family member is requesting results.

## 2021-06-03 DIAGNOSIS — E039 Hypothyroidism, unspecified: Secondary | ICD-10-CM | POA: Diagnosis not present

## 2021-06-03 DIAGNOSIS — M810 Age-related osteoporosis without current pathological fracture: Secondary | ICD-10-CM | POA: Diagnosis not present

## 2021-06-03 DIAGNOSIS — I6340 Cerebral infarction due to embolism of unspecified cerebral artery: Secondary | ICD-10-CM | POA: Diagnosis not present

## 2021-06-03 DIAGNOSIS — E785 Hyperlipidemia, unspecified: Secondary | ICD-10-CM | POA: Diagnosis not present

## 2021-06-03 DIAGNOSIS — N390 Urinary tract infection, site not specified: Secondary | ICD-10-CM | POA: Diagnosis not present

## 2021-06-03 DIAGNOSIS — I69354 Hemiplegia and hemiparesis following cerebral infarction affecting left non-dominant side: Secondary | ICD-10-CM | POA: Diagnosis not present

## 2021-06-03 DIAGNOSIS — I4891 Unspecified atrial fibrillation: Secondary | ICD-10-CM | POA: Diagnosis not present

## 2021-06-03 DIAGNOSIS — Z7982 Long term (current) use of aspirin: Secondary | ICD-10-CM | POA: Diagnosis not present

## 2021-06-03 DIAGNOSIS — I2510 Atherosclerotic heart disease of native coronary artery without angina pectoris: Secondary | ICD-10-CM | POA: Diagnosis not present

## 2021-06-03 DIAGNOSIS — I10 Essential (primary) hypertension: Secondary | ICD-10-CM | POA: Diagnosis not present

## 2021-06-03 DIAGNOSIS — Z87440 Personal history of urinary (tract) infections: Secondary | ICD-10-CM | POA: Diagnosis not present

## 2021-06-03 DIAGNOSIS — Z9181 History of falling: Secondary | ICD-10-CM | POA: Diagnosis not present

## 2021-06-03 DIAGNOSIS — M542 Cervicalgia: Secondary | ICD-10-CM | POA: Diagnosis not present

## 2021-06-03 DIAGNOSIS — F32A Depression, unspecified: Secondary | ICD-10-CM | POA: Diagnosis not present

## 2021-06-07 DIAGNOSIS — E039 Hypothyroidism, unspecified: Secondary | ICD-10-CM | POA: Diagnosis not present

## 2021-06-07 DIAGNOSIS — F32A Depression, unspecified: Secondary | ICD-10-CM | POA: Diagnosis not present

## 2021-06-07 DIAGNOSIS — I6340 Cerebral infarction due to embolism of unspecified cerebral artery: Secondary | ICD-10-CM | POA: Diagnosis not present

## 2021-06-07 DIAGNOSIS — I10 Essential (primary) hypertension: Secondary | ICD-10-CM | POA: Diagnosis not present

## 2021-06-07 DIAGNOSIS — N390 Urinary tract infection, site not specified: Secondary | ICD-10-CM | POA: Diagnosis not present

## 2021-06-07 DIAGNOSIS — M542 Cervicalgia: Secondary | ICD-10-CM | POA: Diagnosis not present

## 2021-06-07 DIAGNOSIS — I69354 Hemiplegia and hemiparesis following cerebral infarction affecting left non-dominant side: Secondary | ICD-10-CM | POA: Diagnosis not present

## 2021-06-07 DIAGNOSIS — Z87440 Personal history of urinary (tract) infections: Secondary | ICD-10-CM | POA: Diagnosis not present

## 2021-06-07 DIAGNOSIS — M810 Age-related osteoporosis without current pathological fracture: Secondary | ICD-10-CM | POA: Diagnosis not present

## 2021-06-07 DIAGNOSIS — E785 Hyperlipidemia, unspecified: Secondary | ICD-10-CM | POA: Diagnosis not present

## 2021-06-07 DIAGNOSIS — I2510 Atherosclerotic heart disease of native coronary artery without angina pectoris: Secondary | ICD-10-CM | POA: Diagnosis not present

## 2021-06-07 DIAGNOSIS — I4891 Unspecified atrial fibrillation: Secondary | ICD-10-CM | POA: Diagnosis not present

## 2021-06-07 DIAGNOSIS — Z9181 History of falling: Secondary | ICD-10-CM | POA: Diagnosis not present

## 2021-06-07 DIAGNOSIS — Z7982 Long term (current) use of aspirin: Secondary | ICD-10-CM | POA: Diagnosis not present

## 2021-06-09 DIAGNOSIS — Z7982 Long term (current) use of aspirin: Secondary | ICD-10-CM | POA: Diagnosis not present

## 2021-06-09 DIAGNOSIS — Z9181 History of falling: Secondary | ICD-10-CM | POA: Diagnosis not present

## 2021-06-09 DIAGNOSIS — F32A Depression, unspecified: Secondary | ICD-10-CM | POA: Diagnosis not present

## 2021-06-09 DIAGNOSIS — E785 Hyperlipidemia, unspecified: Secondary | ICD-10-CM | POA: Diagnosis not present

## 2021-06-09 DIAGNOSIS — I6340 Cerebral infarction due to embolism of unspecified cerebral artery: Secondary | ICD-10-CM | POA: Diagnosis not present

## 2021-06-09 DIAGNOSIS — Z87440 Personal history of urinary (tract) infections: Secondary | ICD-10-CM | POA: Diagnosis not present

## 2021-06-09 DIAGNOSIS — I4891 Unspecified atrial fibrillation: Secondary | ICD-10-CM | POA: Diagnosis not present

## 2021-06-09 DIAGNOSIS — N390 Urinary tract infection, site not specified: Secondary | ICD-10-CM | POA: Diagnosis not present

## 2021-06-09 DIAGNOSIS — E039 Hypothyroidism, unspecified: Secondary | ICD-10-CM | POA: Diagnosis not present

## 2021-06-09 DIAGNOSIS — I2510 Atherosclerotic heart disease of native coronary artery without angina pectoris: Secondary | ICD-10-CM | POA: Diagnosis not present

## 2021-06-09 DIAGNOSIS — I10 Essential (primary) hypertension: Secondary | ICD-10-CM | POA: Diagnosis not present

## 2021-06-09 DIAGNOSIS — I69354 Hemiplegia and hemiparesis following cerebral infarction affecting left non-dominant side: Secondary | ICD-10-CM | POA: Diagnosis not present

## 2021-06-09 DIAGNOSIS — M810 Age-related osteoporosis without current pathological fracture: Secondary | ICD-10-CM | POA: Diagnosis not present

## 2021-06-09 DIAGNOSIS — M542 Cervicalgia: Secondary | ICD-10-CM | POA: Diagnosis not present

## 2021-06-16 ENCOUNTER — Telehealth: Payer: Self-pay | Admitting: Family Medicine

## 2021-06-16 DIAGNOSIS — E039 Hypothyroidism, unspecified: Secondary | ICD-10-CM | POA: Diagnosis not present

## 2021-06-16 DIAGNOSIS — E785 Hyperlipidemia, unspecified: Secondary | ICD-10-CM | POA: Diagnosis not present

## 2021-06-16 DIAGNOSIS — M810 Age-related osteoporosis without current pathological fracture: Secondary | ICD-10-CM | POA: Diagnosis not present

## 2021-06-16 DIAGNOSIS — Z87440 Personal history of urinary (tract) infections: Secondary | ICD-10-CM

## 2021-06-16 DIAGNOSIS — I6340 Cerebral infarction due to embolism of unspecified cerebral artery: Secondary | ICD-10-CM | POA: Diagnosis not present

## 2021-06-16 DIAGNOSIS — F32A Depression, unspecified: Secondary | ICD-10-CM | POA: Diagnosis not present

## 2021-06-16 DIAGNOSIS — I69354 Hemiplegia and hemiparesis following cerebral infarction affecting left non-dominant side: Secondary | ICD-10-CM | POA: Diagnosis not present

## 2021-06-16 DIAGNOSIS — I4891 Unspecified atrial fibrillation: Secondary | ICD-10-CM | POA: Diagnosis not present

## 2021-06-16 DIAGNOSIS — N390 Urinary tract infection, site not specified: Secondary | ICD-10-CM | POA: Diagnosis not present

## 2021-06-16 DIAGNOSIS — I2510 Atherosclerotic heart disease of native coronary artery without angina pectoris: Secondary | ICD-10-CM | POA: Diagnosis not present

## 2021-06-16 DIAGNOSIS — I10 Essential (primary) hypertension: Secondary | ICD-10-CM | POA: Diagnosis not present

## 2021-06-16 DIAGNOSIS — Z9181 History of falling: Secondary | ICD-10-CM | POA: Diagnosis not present

## 2021-06-16 DIAGNOSIS — Z7982 Long term (current) use of aspirin: Secondary | ICD-10-CM | POA: Diagnosis not present

## 2021-06-16 DIAGNOSIS — M542 Cervicalgia: Secondary | ICD-10-CM | POA: Diagnosis not present

## 2021-06-16 NOTE — Telephone Encounter (Signed)
There is not clinical evidence to test for clearance, we will just watch for symptoms in the next time they come in we can always discuss that.

## 2021-06-16 NOTE — Telephone Encounter (Signed)
Left message to return call 

## 2021-06-16 NOTE — Telephone Encounter (Signed)
Returning nurse call.

## 2021-06-16 NOTE — Telephone Encounter (Signed)
Pt informed and understood. She would like to schedule an appt before September due to stroke on July 1st.  Appt scheduled 8/31

## 2021-06-17 DIAGNOSIS — I6340 Cerebral infarction due to embolism of unspecified cerebral artery: Secondary | ICD-10-CM | POA: Diagnosis not present

## 2021-06-17 DIAGNOSIS — N390 Urinary tract infection, site not specified: Secondary | ICD-10-CM | POA: Diagnosis not present

## 2021-06-17 DIAGNOSIS — I10 Essential (primary) hypertension: Secondary | ICD-10-CM | POA: Diagnosis not present

## 2021-06-17 DIAGNOSIS — Z7982 Long term (current) use of aspirin: Secondary | ICD-10-CM | POA: Diagnosis not present

## 2021-06-17 DIAGNOSIS — I2510 Atherosclerotic heart disease of native coronary artery without angina pectoris: Secondary | ICD-10-CM | POA: Diagnosis not present

## 2021-06-17 DIAGNOSIS — M542 Cervicalgia: Secondary | ICD-10-CM | POA: Diagnosis not present

## 2021-06-17 DIAGNOSIS — E785 Hyperlipidemia, unspecified: Secondary | ICD-10-CM | POA: Diagnosis not present

## 2021-06-17 DIAGNOSIS — E039 Hypothyroidism, unspecified: Secondary | ICD-10-CM | POA: Diagnosis not present

## 2021-06-17 DIAGNOSIS — I69354 Hemiplegia and hemiparesis following cerebral infarction affecting left non-dominant side: Secondary | ICD-10-CM | POA: Diagnosis not present

## 2021-06-17 DIAGNOSIS — M810 Age-related osteoporosis without current pathological fracture: Secondary | ICD-10-CM | POA: Diagnosis not present

## 2021-06-17 DIAGNOSIS — I4891 Unspecified atrial fibrillation: Secondary | ICD-10-CM | POA: Diagnosis not present

## 2021-06-17 DIAGNOSIS — Z87440 Personal history of urinary (tract) infections: Secondary | ICD-10-CM | POA: Diagnosis not present

## 2021-06-17 DIAGNOSIS — F32A Depression, unspecified: Secondary | ICD-10-CM | POA: Diagnosis not present

## 2021-06-17 DIAGNOSIS — Z9181 History of falling: Secondary | ICD-10-CM | POA: Diagnosis not present

## 2021-06-17 NOTE — Telephone Encounter (Signed)
Daughter [name] wants to nurse about the order for urine specimen recheck. Daughter says that pt does not need an apt. Please call back to clarigy

## 2021-06-17 NOTE — Telephone Encounter (Signed)
If it is okay for her to come in and leave a sample, I can enter the order, please advise.

## 2021-06-17 NOTE — Telephone Encounter (Signed)
Orders placed, daughter aware.

## 2021-06-17 NOTE — Addendum Note (Signed)
Addended by: Michaela Corner on: 06/17/2021 01:29 PM   Modules accepted: Orders

## 2021-06-21 ENCOUNTER — Other Ambulatory Visit: Payer: Self-pay

## 2021-06-21 ENCOUNTER — Other Ambulatory Visit: Payer: Medicare Other

## 2021-06-21 ENCOUNTER — Telehealth: Payer: Self-pay | Admitting: Cardiology

## 2021-06-21 DIAGNOSIS — I2510 Atherosclerotic heart disease of native coronary artery without angina pectoris: Secondary | ICD-10-CM | POA: Diagnosis not present

## 2021-06-21 DIAGNOSIS — I10 Essential (primary) hypertension: Secondary | ICD-10-CM | POA: Diagnosis not present

## 2021-06-21 DIAGNOSIS — I4891 Unspecified atrial fibrillation: Secondary | ICD-10-CM | POA: Diagnosis not present

## 2021-06-21 DIAGNOSIS — E039 Hypothyroidism, unspecified: Secondary | ICD-10-CM | POA: Diagnosis not present

## 2021-06-21 DIAGNOSIS — I6340 Cerebral infarction due to embolism of unspecified cerebral artery: Secondary | ICD-10-CM | POA: Diagnosis not present

## 2021-06-21 DIAGNOSIS — Z7689 Persons encountering health services in other specified circumstances: Secondary | ICD-10-CM | POA: Diagnosis not present

## 2021-06-21 DIAGNOSIS — M542 Cervicalgia: Secondary | ICD-10-CM | POA: Diagnosis not present

## 2021-06-21 DIAGNOSIS — Z87440 Personal history of urinary (tract) infections: Secondary | ICD-10-CM | POA: Diagnosis not present

## 2021-06-21 DIAGNOSIS — N390 Urinary tract infection, site not specified: Secondary | ICD-10-CM | POA: Diagnosis not present

## 2021-06-21 DIAGNOSIS — M810 Age-related osteoporosis without current pathological fracture: Secondary | ICD-10-CM | POA: Diagnosis not present

## 2021-06-21 DIAGNOSIS — E785 Hyperlipidemia, unspecified: Secondary | ICD-10-CM | POA: Diagnosis not present

## 2021-06-21 DIAGNOSIS — Z9181 History of falling: Secondary | ICD-10-CM | POA: Diagnosis not present

## 2021-06-21 DIAGNOSIS — F32A Depression, unspecified: Secondary | ICD-10-CM | POA: Diagnosis not present

## 2021-06-21 DIAGNOSIS — I69354 Hemiplegia and hemiparesis following cerebral infarction affecting left non-dominant side: Secondary | ICD-10-CM | POA: Diagnosis not present

## 2021-06-21 DIAGNOSIS — Z7982 Long term (current) use of aspirin: Secondary | ICD-10-CM | POA: Diagnosis not present

## 2021-06-21 LAB — URINALYSIS, COMPLETE
Bilirubin, UA: NEGATIVE
Glucose, UA: NEGATIVE
Ketones, UA: NEGATIVE
Nitrite, UA: NEGATIVE
Protein,UA: NEGATIVE
RBC, UA: NEGATIVE
Specific Gravity, UA: 1.015 (ref 1.005–1.030)
Urobilinogen, Ur: 0.2 mg/dL (ref 0.2–1.0)
pH, UA: 6.5 (ref 5.0–7.5)

## 2021-06-21 LAB — MICROSCOPIC EXAMINATION

## 2021-06-21 NOTE — Telephone Encounter (Signed)
Patient called to let dr hocherin know that she had a stroke on July 1st and looking to get appt with him in [place] to check her out. Explain that there wasn't anything until 10/26. Please advise

## 2021-06-21 NOTE — Telephone Encounter (Signed)
Returned call to patient to advise her that there are no openings with Dr. Percival Spanish in [place] until October and that [employer], RN is out of the office until next week. I advised that I will forward message for her review when she returns to the office. Patient states she wanted to let Dr. Percival Spanish know that she had a stroke on July 1 and is recovering well with the help of therapists in her home. She thanked me for calling her.

## 2021-06-22 DIAGNOSIS — I10 Essential (primary) hypertension: Secondary | ICD-10-CM | POA: Diagnosis not present

## 2021-06-22 DIAGNOSIS — F32A Depression, unspecified: Secondary | ICD-10-CM | POA: Diagnosis not present

## 2021-06-22 DIAGNOSIS — I6340 Cerebral infarction due to embolism of unspecified cerebral artery: Secondary | ICD-10-CM | POA: Diagnosis not present

## 2021-06-22 DIAGNOSIS — M810 Age-related osteoporosis without current pathological fracture: Secondary | ICD-10-CM | POA: Diagnosis not present

## 2021-06-22 DIAGNOSIS — Z87440 Personal history of urinary (tract) infections: Secondary | ICD-10-CM | POA: Diagnosis not present

## 2021-06-22 DIAGNOSIS — E039 Hypothyroidism, unspecified: Secondary | ICD-10-CM | POA: Diagnosis not present

## 2021-06-22 DIAGNOSIS — I4891 Unspecified atrial fibrillation: Secondary | ICD-10-CM | POA: Diagnosis not present

## 2021-06-22 DIAGNOSIS — Z7982 Long term (current) use of aspirin: Secondary | ICD-10-CM | POA: Diagnosis not present

## 2021-06-22 DIAGNOSIS — M542 Cervicalgia: Secondary | ICD-10-CM | POA: Diagnosis not present

## 2021-06-22 DIAGNOSIS — N390 Urinary tract infection, site not specified: Secondary | ICD-10-CM | POA: Diagnosis not present

## 2021-06-22 DIAGNOSIS — E785 Hyperlipidemia, unspecified: Secondary | ICD-10-CM | POA: Diagnosis not present

## 2021-06-22 DIAGNOSIS — I2510 Atherosclerotic heart disease of native coronary artery without angina pectoris: Secondary | ICD-10-CM | POA: Diagnosis not present

## 2021-06-22 DIAGNOSIS — Z9181 History of falling: Secondary | ICD-10-CM | POA: Diagnosis not present

## 2021-06-22 DIAGNOSIS — I69354 Hemiplegia and hemiparesis following cerebral infarction affecting left non-dominant side: Secondary | ICD-10-CM | POA: Diagnosis not present

## 2021-06-22 NOTE — Telephone Encounter (Signed)
Patient called again because she had not heard from Dr. Rosezella [place] Nurse yet. The patient thought the Nurse was just out yesterday. I advised the patient that the Nurse would be out all week

## 2021-06-23 DIAGNOSIS — I10 Essential (primary) hypertension: Secondary | ICD-10-CM | POA: Diagnosis not present

## 2021-06-23 DIAGNOSIS — Z7982 Long term (current) use of aspirin: Secondary | ICD-10-CM | POA: Diagnosis not present

## 2021-06-23 DIAGNOSIS — M542 Cervicalgia: Secondary | ICD-10-CM | POA: Diagnosis not present

## 2021-06-23 DIAGNOSIS — M810 Age-related osteoporosis without current pathological fracture: Secondary | ICD-10-CM | POA: Diagnosis not present

## 2021-06-23 DIAGNOSIS — Z87440 Personal history of urinary (tract) infections: Secondary | ICD-10-CM | POA: Diagnosis not present

## 2021-06-23 DIAGNOSIS — I4891 Unspecified atrial fibrillation: Secondary | ICD-10-CM | POA: Diagnosis not present

## 2021-06-23 DIAGNOSIS — N390 Urinary tract infection, site not specified: Secondary | ICD-10-CM | POA: Diagnosis not present

## 2021-06-23 DIAGNOSIS — I2510 Atherosclerotic heart disease of native coronary artery without angina pectoris: Secondary | ICD-10-CM | POA: Diagnosis not present

## 2021-06-23 DIAGNOSIS — F32A Depression, unspecified: Secondary | ICD-10-CM | POA: Diagnosis not present

## 2021-06-23 DIAGNOSIS — E785 Hyperlipidemia, unspecified: Secondary | ICD-10-CM | POA: Diagnosis not present

## 2021-06-23 DIAGNOSIS — I6340 Cerebral infarction due to embolism of unspecified cerebral artery: Secondary | ICD-10-CM | POA: Diagnosis not present

## 2021-06-23 DIAGNOSIS — E039 Hypothyroidism, unspecified: Secondary | ICD-10-CM | POA: Diagnosis not present

## 2021-06-23 DIAGNOSIS — I69354 Hemiplegia and hemiparesis following cerebral infarction affecting left non-dominant side: Secondary | ICD-10-CM | POA: Diagnosis not present

## 2021-06-23 DIAGNOSIS — Z9181 History of falling: Secondary | ICD-10-CM | POA: Diagnosis not present

## 2021-06-24 DIAGNOSIS — E785 Hyperlipidemia, unspecified: Secondary | ICD-10-CM | POA: Diagnosis not present

## 2021-06-24 DIAGNOSIS — I10 Essential (primary) hypertension: Secondary | ICD-10-CM | POA: Diagnosis not present

## 2021-06-24 DIAGNOSIS — I2510 Atherosclerotic heart disease of native coronary artery without angina pectoris: Secondary | ICD-10-CM | POA: Diagnosis not present

## 2021-06-24 DIAGNOSIS — Z7982 Long term (current) use of aspirin: Secondary | ICD-10-CM | POA: Diagnosis not present

## 2021-06-24 DIAGNOSIS — M542 Cervicalgia: Secondary | ICD-10-CM | POA: Diagnosis not present

## 2021-06-24 DIAGNOSIS — Z9181 History of falling: Secondary | ICD-10-CM | POA: Diagnosis not present

## 2021-06-24 DIAGNOSIS — E039 Hypothyroidism, unspecified: Secondary | ICD-10-CM | POA: Diagnosis not present

## 2021-06-24 DIAGNOSIS — M810 Age-related osteoporosis without current pathological fracture: Secondary | ICD-10-CM | POA: Diagnosis not present

## 2021-06-24 DIAGNOSIS — I6340 Cerebral infarction due to embolism of unspecified cerebral artery: Secondary | ICD-10-CM | POA: Diagnosis not present

## 2021-06-24 DIAGNOSIS — I69354 Hemiplegia and hemiparesis following cerebral infarction affecting left non-dominant side: Secondary | ICD-10-CM | POA: Diagnosis not present

## 2021-06-24 DIAGNOSIS — F32A Depression, unspecified: Secondary | ICD-10-CM | POA: Diagnosis not present

## 2021-06-24 DIAGNOSIS — N390 Urinary tract infection, site not specified: Secondary | ICD-10-CM | POA: Diagnosis not present

## 2021-06-24 DIAGNOSIS — Z87440 Personal history of urinary (tract) infections: Secondary | ICD-10-CM | POA: Diagnosis not present

## 2021-06-24 DIAGNOSIS — I4891 Unspecified atrial fibrillation: Secondary | ICD-10-CM | POA: Diagnosis not present

## 2021-06-24 LAB — URINE CULTURE

## 2021-06-27 NOTE — Telephone Encounter (Signed)
Spoke with pt - appt scheduled at 9 am in [place] office on 8/10

## 2021-06-28 ENCOUNTER — Telehealth: Payer: Self-pay | Admitting: Family Medicine

## 2021-06-28 DIAGNOSIS — Z87440 Personal history of urinary (tract) infections: Secondary | ICD-10-CM | POA: Diagnosis not present

## 2021-06-28 DIAGNOSIS — I2510 Atherosclerotic heart disease of native coronary artery without angina pectoris: Secondary | ICD-10-CM | POA: Diagnosis not present

## 2021-06-28 DIAGNOSIS — I4891 Unspecified atrial fibrillation: Secondary | ICD-10-CM | POA: Diagnosis not present

## 2021-06-28 DIAGNOSIS — E785 Hyperlipidemia, unspecified: Secondary | ICD-10-CM | POA: Diagnosis not present

## 2021-06-28 DIAGNOSIS — I10 Essential (primary) hypertension: Secondary | ICD-10-CM | POA: Diagnosis not present

## 2021-06-28 DIAGNOSIS — F32A Depression, unspecified: Secondary | ICD-10-CM | POA: Diagnosis not present

## 2021-06-28 DIAGNOSIS — M542 Cervicalgia: Secondary | ICD-10-CM | POA: Diagnosis not present

## 2021-06-28 DIAGNOSIS — Z7982 Long term (current) use of aspirin: Secondary | ICD-10-CM | POA: Diagnosis not present

## 2021-06-28 DIAGNOSIS — M810 Age-related osteoporosis without current pathological fracture: Secondary | ICD-10-CM | POA: Diagnosis not present

## 2021-06-28 DIAGNOSIS — I69354 Hemiplegia and hemiparesis following cerebral infarction affecting left non-dominant side: Secondary | ICD-10-CM | POA: Diagnosis not present

## 2021-06-28 DIAGNOSIS — Z9181 History of falling: Secondary | ICD-10-CM | POA: Diagnosis not present

## 2021-06-28 DIAGNOSIS — E039 Hypothyroidism, unspecified: Secondary | ICD-10-CM | POA: Diagnosis not present

## 2021-06-28 NOTE — Telephone Encounter (Signed)
Informed pt that she should not drive at all since Dr. Warrick Parisian recommended that. Moved an appt up sooner to discuss clearance and recent stroke to 8/18. Daughter made aware as well.

## 2021-06-29 DIAGNOSIS — M542 Cervicalgia: Secondary | ICD-10-CM | POA: Diagnosis not present

## 2021-06-29 DIAGNOSIS — F32A Depression, unspecified: Secondary | ICD-10-CM | POA: Diagnosis not present

## 2021-06-29 DIAGNOSIS — I69354 Hemiplegia and hemiparesis following cerebral infarction affecting left non-dominant side: Secondary | ICD-10-CM | POA: Diagnosis not present

## 2021-06-29 DIAGNOSIS — Z9181 History of falling: Secondary | ICD-10-CM | POA: Diagnosis not present

## 2021-06-29 DIAGNOSIS — I10 Essential (primary) hypertension: Secondary | ICD-10-CM | POA: Diagnosis not present

## 2021-06-29 DIAGNOSIS — I4891 Unspecified atrial fibrillation: Secondary | ICD-10-CM | POA: Diagnosis not present

## 2021-06-29 DIAGNOSIS — Z87440 Personal history of urinary (tract) infections: Secondary | ICD-10-CM | POA: Diagnosis not present

## 2021-06-29 DIAGNOSIS — E785 Hyperlipidemia, unspecified: Secondary | ICD-10-CM | POA: Diagnosis not present

## 2021-06-29 DIAGNOSIS — Z7982 Long term (current) use of aspirin: Secondary | ICD-10-CM | POA: Diagnosis not present

## 2021-06-29 DIAGNOSIS — E039 Hypothyroidism, unspecified: Secondary | ICD-10-CM | POA: Diagnosis not present

## 2021-06-29 DIAGNOSIS — M810 Age-related osteoporosis without current pathological fracture: Secondary | ICD-10-CM | POA: Diagnosis not present

## 2021-06-29 DIAGNOSIS — I2510 Atherosclerotic heart disease of native coronary artery without angina pectoris: Secondary | ICD-10-CM | POA: Diagnosis not present

## 2021-07-04 ENCOUNTER — Ambulatory Visit: Payer: Medicare Other | Admitting: Neurology

## 2021-07-04 DIAGNOSIS — Z8673 Personal history of transient ischemic attack (TIA), and cerebral infarction without residual deficits: Secondary | ICD-10-CM | POA: Insufficient documentation

## 2021-07-04 NOTE — Progress Notes (Signed)
Cardiology Office Note   Date:  07/06/2021   ID:  Emily Wagner, DOB 10-Jun-1933, MRN [id number]  PCP:  [name], [name], MD  Cardiologist:   None   Chief Complaint  Patient presents with   Cerebrovascular Accident       History of Present Illness: Emily Wagner is a 87 y.o. female who presents for evaluation of atrial fib.     She has a history of  bypass surgery.  She has had fluctuating BPs.  Since She has a diagnosis of post polio syndrome.   S she has chronic fatigue.   Since I last saw her she had an acute CVA in May.  She was hospitalized at [facility] with acute ischemic right MCA stroke.  She had thrombectomy.  I reviewed these records for this visit.    She has actually done well despite the stroke.  She is very tired of working with the PT at home.   She has chronic leg pain.  However, she does not really report any new cardiovascular or neurologic symptoms.  She walks with a walker.  She has some leg weakness but she cannot really point out that this is different than previous.  He has not had any palpitations, presyncope or syncope.  She had 1 episode of chest discomfort since I saw her.  This was self-limited 1 day.  She is otherwise not describing chest pain, neck or arm discomfort.  She is had no new shortness of breath, PND or orthopnea.  She has had fatigue.     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   LIMA to LAD    Past Surgical History:  Procedure Laterality Date   ABDOMINAL HYSTERECTOMY     Cancer and  endometriosis   CARDIAC CATHETERIZATION  2008   CARDIAC CATHETERIZATION N/A 10/29/2015   Procedure: Left Heart Cath and Coronary Angiography;  Surgeon: Belva Crome, MD;  Location: [facility];  Service: Cardiovascular;  Laterality: N/A;   CATARACT EXTRACTION     CHOLECYSTECTOMY     CORONARY ARTERY BYPASS GRAFT N/A 11/02/2015   Procedure: OFF PUMP CORONARY ARTERY BYPASS GRAFTING (CABG) TIMES ONE;  Surgeon: Rexene Alberts, MD;  Location: [facility];  Service: Open Heart Surgery;  Laterality: N/A;  LIMA to LAD   FEMUR FRACTURE SURGERY  09/2015   right hip compression screws    FRACTURE SURGERY     Right hip   HERNIA REPAIR     Right inguinal   TEE WITHOUT CARDIOVERSION N/A 11/02/2015   Procedure: TRANSESOPHAGEAL ECHOCARDIOGRAM (TEE);  Surgeon: Rexene Alberts, MD;  Location: [facility];  Service: Open Heart Surgery;  Laterality: N/A;     Current Outpatient Medications  Medication Sig Dispense Refill   acetaminophen (TYLENOL) 325 MG tablet Take 325 mg by mouth every 6 (six) hours as needed.     amLODipine (NORVASC) 5 MG tablet Take 1 tablet (5 mg total) by mouth daily. (Patient taking differently: 1 tablet. Take 1/2  tablet by mouth daily) 90 tablet 3   aspirin EC 81 MG tablet Take 81 mg by mouth daily.     bethanechol (URECHOLINE) 10 MG tablet Take 1 tablet (10 mg total) by mouth 3 (three) times daily. 270 tablet 3   cholecalciferol (VITAMIN D3) 25 MCG (1000 UNIT) tablet Take 1,000 Units by mouth daily.     Cyanocobalamin (VITAMIN B-12 PO) Take 500 mcg by mouth every morning.     famotidine (PEPCID) 20 MG tablet TAKE 1 TABLET BY MOUTH  TWICE DAILY 180 tablet 3   hydrochlorothiazide (HYDRODIURIL) 25 MG tablet Take 1 tablet (25 mg total) by mouth daily. 90 tablet 3   levothyroxine (SYNTHROID) 50 MCG tablet TAKE 1 TABLET BY MOUTH  DAILY BEFORE BREAKFAST 90 tablet 3   LORazepam (ATIVAN) 0.5 MG tablet Take 1-2 tablets (0.5-1 mg total) by mouth at bedtime as needed. 60 tablet 3   Multiple  Vitamins-Minerals (PRESERVISION AREDS 2 PO) Take 1 capsule by mouth at bedtime.     QUEtiapine (SEROQUEL) 50 MG tablet Take 1 tablet (50 mg total) by mouth at bedtime. 30 tablet 3   senna (SENOKOT) 8.6 MG TABS tablet Take 1 tablet by mouth.     calcium carbonate (TUMS - DOSED IN MG ELEMENTAL CALCIUM) 500 MG chewable tablet Chew 1 tablet by mouth daily. (Patient not taking: Reported on 07/06/2021)     Camphor-Menthol-Methyl Sal (SALONPAS) 3.12-02-08 % PTCH Apply topically. (Patient not taking: Reported on 07/06/2021)     cephALEXin (KEFLEX) 500 MG capsule Take 1 capsule (500 mg total) by mouth 4 (four) times daily. (Patient not taking: Reported on 07/06/2021) 28 capsule 0   lactose free nutrition (BOOST PLUS) LIQD Take 237 mLs by mouth 2 (two) times daily between meals. (Patient not taking: Reported on 07/06/2021) 42660 mL 3   tiZANidine (ZANAFLEX) 4 MG tablet Take 0.5 tablets (2 mg total) by mouth every 8 (eight) hours as needed for muscle spasms. (Patient not taking: Reported on 07/06/2021) 30 tablet 0   Current Facility-Administered Medications  Medication Dose Route Frequency Provider Last Rate Last Admin   methylPREDNISolone acetate (DEPO-MEDROL) injection 80 mg  80 mg Intramuscular Once [name], [name], MD        Allergies:   Nitrofurantoin, Nsaids, Other, Flonase [fluticasone propionate], Prevnar 13 [pneumococcal 13-val conj vacc], Beta adrenergic blockers, Nortriptyline hcl, Potassium-containing compounds, Alendronate sodium, Ciprofloxacin, Codeine, Crestor [rosuvastatin calcium], Darvon, Lipitor [atorvastatin calcium], Pravachol, Propranolol hcl, Sulfa antibiotics, Sulfamethoxazole, Ultram [tramadol hcl], Welchol [colesevelam hcl], Zetia [ezetimibe], and Zocor [simvastatin]    ROS:  Please see the history of present illness.   Otherwise, review of systems are positive for none.   All other systems are reviewed and negative.    PHYSICAL EXAM: VS:  BP 108/80   Pulse 87   Ht '5\' 3"'$  (1.6 m)    Wt 139 lb (63 kg)   LMP 03/21/1971   BMI 24.62 kg/m  , BMI Body mass index is 24.62 kg/m.  GENERAL:  Well appearing NECK:  No jugular venous distention, waveform within normal limits, carotid upstroke brisk and symmetric, no bruits, no thyromegaly LUNGS:  Clear to auscultation bilaterally CHEST:  Unremarkable HEART:  PMI not displaced or sustained,S1 and S2 within normal limits, no S3, no S4, no clicks, no rubs, no murmurs ABD:  Flat, positive bowel sounds normal in frequency in pitch, no bruits, no rebound, no guarding, no midline pulsatile mass, no hepatomegaly, no splenomegaly EXT:  2 plus pulses throughout, no edema, no cyanosis  no clubbing    EKG:  EKG is   ordered today. The ekg ordered today demonstrates sinus rhythm, rate 87, premature atrial contractions, axis within normal limits, possible old inferior infarct.   Recent Labs: 12/23/2020: TSH 1.500 03/27/2021: ALT 15; BUN 36; Creatinine, Ser 1.00; Hemoglobin 15.3; Platelets 273; Potassium 4.3; Sodium 141    Lipid Panel    Component Value Date/Time   CHOL 261 (H) 08/23/2020 1215   TRIG 181 (H) 08/23/2020 1215   TRIG 162 (H) 06/24/2015 0918   HDL 50 08/23/2020 1215   HDL 44 06/24/2015 0918   CHOLHDL 5.2 (H) 08/23/2020 1215   CHOLHDL 5.4 10/31/2015 0215   VLDL 20 10/31/2015 0215   LDLCALC 177 (H) 08/23/2020 1215   LDLCALC 163 (H) 05/14/2014 1038      Wt Readings from Last 3 Encounters:  07/06/21 139 lb (63 kg)  05/06/21 152 lb (68.9 kg)  05/02/21 153 lb 2 oz (69.5 kg)      Other studies Reviewed: Additional studies/ records that were reviewed today include:   Care Everywhere Review of the above records demonstrates:    See elsewhere   ASSESSMENT AND PLAN:   CAD:    The patient has no new sypmtoms.  No further cardiovascular testing is indicated.  We will continue with aggressive risk reduction and meds as listed.  DYSLIPIDEMIA:     She is been intolerant of statins.  She has not wanted a PCSK9 inhibitor.   No change in therapy.  HTN:  Her BP is well controlled.  No change in therapy.   CVA: He does have follow-up with neurology.  I am going to also get her a 4-week monitor.  I looked through the chart and there was no suggestion that she had any arrhythmias or atrial fibrillation.  She was not sent home on DOAC or warfarin.    Current medicines are reviewed at length with the patient today.  The patient does not have concerns regarding medicines.  The following changes have been made: None  Labs/ tests ordered today include:       Orders Placed This Encounter  Procedures   CARDIAC EVENT MONITOR   EKG 12-Lead      Disposition:   FU with me in 6 months   Signed, Minus Breeding, MD  07/06/2021 9:43 AM    [facility]

## 2021-07-04 NOTE — Telephone Encounter (Signed)
Contacted patients daughter. She states patient is doing much better and looks forward to her visit with Dr. Warrick Parisian

## 2021-07-05 DIAGNOSIS — I10 Essential (primary) hypertension: Secondary | ICD-10-CM | POA: Diagnosis not present

## 2021-07-05 DIAGNOSIS — I4891 Unspecified atrial fibrillation: Secondary | ICD-10-CM | POA: Diagnosis not present

## 2021-07-05 DIAGNOSIS — Z7982 Long term (current) use of aspirin: Secondary | ICD-10-CM | POA: Diagnosis not present

## 2021-07-05 DIAGNOSIS — M810 Age-related osteoporosis without current pathological fracture: Secondary | ICD-10-CM | POA: Diagnosis not present

## 2021-07-05 DIAGNOSIS — E039 Hypothyroidism, unspecified: Secondary | ICD-10-CM | POA: Diagnosis not present

## 2021-07-05 DIAGNOSIS — M542 Cervicalgia: Secondary | ICD-10-CM | POA: Diagnosis not present

## 2021-07-05 DIAGNOSIS — Z9181 History of falling: Secondary | ICD-10-CM | POA: Diagnosis not present

## 2021-07-05 DIAGNOSIS — I2510 Atherosclerotic heart disease of native coronary artery without angina pectoris: Secondary | ICD-10-CM | POA: Diagnosis not present

## 2021-07-05 DIAGNOSIS — F32A Depression, unspecified: Secondary | ICD-10-CM | POA: Diagnosis not present

## 2021-07-05 DIAGNOSIS — Z87440 Personal history of urinary (tract) infections: Secondary | ICD-10-CM | POA: Diagnosis not present

## 2021-07-05 DIAGNOSIS — E785 Hyperlipidemia, unspecified: Secondary | ICD-10-CM | POA: Diagnosis not present

## 2021-07-05 DIAGNOSIS — I69354 Hemiplegia and hemiparesis following cerebral infarction affecting left non-dominant side: Secondary | ICD-10-CM | POA: Diagnosis not present

## 2021-07-06 ENCOUNTER — Encounter: Payer: Self-pay | Admitting: Cardiology

## 2021-07-06 ENCOUNTER — Ambulatory Visit (INDEPENDENT_AMBULATORY_CARE_PROVIDER_SITE_OTHER): Payer: Medicare Other | Admitting: Cardiology

## 2021-07-06 ENCOUNTER — Telehealth: Payer: Self-pay | Admitting: Cardiology

## 2021-07-06 ENCOUNTER — Encounter: Payer: Self-pay | Admitting: *Deleted

## 2021-07-06 ENCOUNTER — Other Ambulatory Visit: Payer: Self-pay

## 2021-07-06 VITALS — BP 108/80 | HR 87 | Ht 63.0 in | Wt 139.0 lb

## 2021-07-06 DIAGNOSIS — E785 Hyperlipidemia, unspecified: Secondary | ICD-10-CM

## 2021-07-06 DIAGNOSIS — I10 Essential (primary) hypertension: Secondary | ICD-10-CM | POA: Diagnosis not present

## 2021-07-06 DIAGNOSIS — I2510 Atherosclerotic heart disease of native coronary artery without angina pectoris: Secondary | ICD-10-CM | POA: Diagnosis not present

## 2021-07-06 DIAGNOSIS — Z8673 Personal history of transient ischemic attack (TIA), and cerebral infarction without residual deficits: Secondary | ICD-10-CM

## 2021-07-06 NOTE — Telephone Encounter (Signed)
New Message:    Patient said she is supposed to wear an Event Monitor.  She said her problem is she have no reception in her area. Will she still be able to wear the Monitor or what does she need to do please?

## 2021-07-06 NOTE — Patient Instructions (Signed)
Medication Instructions:  The current medical regimen is effective;  continue present plan and medications.  *If you need a refill on your cardiac medications before your next appointment, please call your pharmacy*  Testing/Procedures: Your physician has recommended that you wear an event monitor for 30 days. Event monitors are medical devices that record the heart's electrical activity. Doctors most often [name] these monitors to diagnose arrhythmias. Arrhythmias are problems with the speed or rhythm of the heartbeat. The monitor is a small, portable device. You can wear one while you do your normal daily activities. This is usually used to diagnose what is causing palpitations/syncope (passing out).  Follow-Up: At [facility], you and your health needs are our priority.  As part of our continuing mission to provide you with exceptional heart care, we have created designated Provider Care Teams.  These Care Teams include your primary Cardiologist (physician) and Advanced Practice Providers (APPs -  Physician Assistants and Nurse Practitioners) who all work together to provide you with the care you need, when you need it.  We recommend signing up for the patient portal called "MyChart".  Sign up information is provided on this After Visit Summary.  MyChart is used to connect with patients for Virtual Visits (Telemedicine).  Patients are able to view lab/test results, encounter notes, upcoming appointments, etc.  Non-urgent messages can be sent to your provider as well.   To learn more about what you can do with MyChart, go to [email or web address].    Your next appointment:   6 month(s)  The format for your next appointment:   In Person  Provider:   Minus Breeding, MD  Thank you for choosing [facility]!!

## 2021-07-06 NOTE — Telephone Encounter (Signed)
Explained to patient how monitor will hold data and transmit to [employer] when she does enter an area with cell phone coverage. If data is never transmitted, [employer] will be able to retreive data from monitor once she ships it back to them. Patient states she does not use her My Chart.  I will mail the patient a copy of the [employer] cardiac event monitor instructions.

## 2021-07-08 DIAGNOSIS — I2510 Atherosclerotic heart disease of native coronary artery without angina pectoris: Secondary | ICD-10-CM | POA: Diagnosis not present

## 2021-07-08 DIAGNOSIS — Z87440 Personal history of urinary (tract) infections: Secondary | ICD-10-CM | POA: Diagnosis not present

## 2021-07-08 DIAGNOSIS — Z7982 Long term (current) use of aspirin: Secondary | ICD-10-CM | POA: Diagnosis not present

## 2021-07-08 DIAGNOSIS — F32A Depression, unspecified: Secondary | ICD-10-CM | POA: Diagnosis not present

## 2021-07-08 DIAGNOSIS — M542 Cervicalgia: Secondary | ICD-10-CM | POA: Diagnosis not present

## 2021-07-08 DIAGNOSIS — M810 Age-related osteoporosis without current pathological fracture: Secondary | ICD-10-CM | POA: Diagnosis not present

## 2021-07-08 DIAGNOSIS — I69354 Hemiplegia and hemiparesis following cerebral infarction affecting left non-dominant side: Secondary | ICD-10-CM | POA: Diagnosis not present

## 2021-07-08 DIAGNOSIS — E785 Hyperlipidemia, unspecified: Secondary | ICD-10-CM | POA: Diagnosis not present

## 2021-07-08 DIAGNOSIS — I4891 Unspecified atrial fibrillation: Secondary | ICD-10-CM | POA: Diagnosis not present

## 2021-07-08 DIAGNOSIS — Z9181 History of falling: Secondary | ICD-10-CM | POA: Diagnosis not present

## 2021-07-08 DIAGNOSIS — I10 Essential (primary) hypertension: Secondary | ICD-10-CM | POA: Diagnosis not present

## 2021-07-08 DIAGNOSIS — E039 Hypothyroidism, unspecified: Secondary | ICD-10-CM | POA: Diagnosis not present

## 2021-07-12 ENCOUNTER — Ambulatory Visit (INDEPENDENT_AMBULATORY_CARE_PROVIDER_SITE_OTHER): Payer: Medicare Other

## 2021-07-12 ENCOUNTER — Telehealth: Payer: Self-pay | Admitting: Cardiology

## 2021-07-12 DIAGNOSIS — I2510 Atherosclerotic heart disease of native coronary artery without angina pectoris: Secondary | ICD-10-CM | POA: Diagnosis not present

## 2021-07-12 DIAGNOSIS — Z8673 Personal history of transient ischemic attack (TIA), and cerebral infarction without residual deficits: Secondary | ICD-10-CM

## 2021-07-12 DIAGNOSIS — I491 Atrial premature depolarization: Secondary | ICD-10-CM

## 2021-07-12 DIAGNOSIS — M810 Age-related osteoporosis without current pathological fracture: Secondary | ICD-10-CM | POA: Diagnosis not present

## 2021-07-12 DIAGNOSIS — Z87440 Personal history of urinary (tract) infections: Secondary | ICD-10-CM | POA: Diagnosis not present

## 2021-07-12 DIAGNOSIS — I639 Cerebral infarction, unspecified: Secondary | ICD-10-CM | POA: Diagnosis not present

## 2021-07-12 DIAGNOSIS — Z7982 Long term (current) use of aspirin: Secondary | ICD-10-CM | POA: Diagnosis not present

## 2021-07-12 DIAGNOSIS — I69354 Hemiplegia and hemiparesis following cerebral infarction affecting left non-dominant side: Secondary | ICD-10-CM | POA: Diagnosis not present

## 2021-07-12 DIAGNOSIS — M542 Cervicalgia: Secondary | ICD-10-CM | POA: Diagnosis not present

## 2021-07-12 DIAGNOSIS — F32A Depression, unspecified: Secondary | ICD-10-CM | POA: Diagnosis not present

## 2021-07-12 DIAGNOSIS — I10 Essential (primary) hypertension: Secondary | ICD-10-CM | POA: Diagnosis not present

## 2021-07-12 DIAGNOSIS — Z9181 History of falling: Secondary | ICD-10-CM | POA: Diagnosis not present

## 2021-07-12 DIAGNOSIS — E039 Hypothyroidism, unspecified: Secondary | ICD-10-CM | POA: Diagnosis not present

## 2021-07-12 DIAGNOSIS — I4891 Unspecified atrial fibrillation: Secondary | ICD-10-CM

## 2021-07-12 DIAGNOSIS — E785 Hyperlipidemia, unspecified: Secondary | ICD-10-CM | POA: Diagnosis not present

## 2021-07-12 NOTE — Telephone Encounter (Signed)
Called patient about monitor billing. See documentation in Account notes.

## 2021-07-12 NOTE — Telephone Encounter (Signed)
   Emily Wagner - [employer] calling to give critical ekg results

## 2021-07-12 NOTE — Telephone Encounter (Signed)
Explained to patient and her daughter, we supply [employer] monitor company with her demographics, insurance information , diagnosis and doctors name.  [employer] will start her monitor as MCT.  If her insurance company does not cover MCT, [employer] will change test to CEM, which is covered by [employer] and is a significantly lower cost. She would only be responsible for amount of insurance adjusted cost, her insurance company did not pay.  Informed patient, we have already been notified by company of an abnormal reading, which, the nurse already discussed with her.  This information was forwarded to Dr. Percival Spanish.  It is very important for her diagnosis and care that she does not remove her monitor.  Patients daughter was given the telephone number for [employer] [phone], to call for a quote on her out of pocket cost for the monitor.  If she does have a balance after her insurance companies payment, they will also set up a payment plan if necessary.

## 2021-07-12 NOTE — Telephone Encounter (Signed)
As I am unfamiliar with the billing process for monitors, will defer to [place] [facility] our billing team to contact patient

## 2021-07-12 NOTE — Telephone Encounter (Signed)
Received call from Emily Wagner auto triggered event  for AFIB @ 160. She states that she "is in the QUE to be called" Called  patient she states that that her Nurse came and applied the monitor about 12 pm. She states that she did not feel this event, no heart racing or any other symptoms, dizziness, chest pain or pressure, etc..Marland KitchenMarland Kitchen

## 2021-07-12 NOTE — Telephone Encounter (Signed)
Reviewed with DOD-Dr Gardiner Rhyme he states that this is ok. Just forward to Dr Percival Spanish for review. Paperwork given to Dr Percival Spanish.

## 2021-07-12 NOTE — Telephone Encounter (Signed)
Patient states she started reading about her heart monitor and it states she is responsible for the cost of it. She states she would have probably said no to the monitor if she new this, because she has many other bills. She would like a call back.

## 2021-07-13 ENCOUNTER — Telehealth: Payer: Self-pay | Admitting: Cardiology

## 2021-07-13 DIAGNOSIS — I480 Paroxysmal atrial fibrillation: Secondary | ICD-10-CM

## 2021-07-13 NOTE — Telephone Encounter (Signed)
Spoke with patient and advised Dr Percival Spanish based on office note Dr Percival Spanish did not want to start any "blood thinner" at this time.  Patient stated she called her insurance about the Eliquis and cost, $131 90 day supply and $37 30 day supply Stated this was too expensive for her. Discussed patient assistance and mailed forms in case she ended up being prescribed Eliquis Will make sure strip is in box for Dr Percival Spanish to review when back in the office

## 2021-07-13 NOTE — Telephone Encounter (Signed)
  pt had heart monitor placed yesterday and has been advised that she has an irregular heart rhythm... would like to speak to someone in regards to this, edu pt that Dr. Gardiner Rhyme who was DOD yesterday has reviewed this info and determined that everything is ok. Pt is now saying that she wants to know if Dr. Percival Spanish is going to put her on blood thinners due to her having a blood clot that caused a stroke on 05/30, please advise

## 2021-07-14 ENCOUNTER — Ambulatory Visit (INDEPENDENT_AMBULATORY_CARE_PROVIDER_SITE_OTHER): Payer: Medicare Other | Admitting: Family Medicine

## 2021-07-14 ENCOUNTER — Encounter: Payer: Self-pay | Admitting: Family Medicine

## 2021-07-14 ENCOUNTER — Other Ambulatory Visit: Payer: Self-pay

## 2021-07-14 VITALS — BP 125/81 | HR 108 | Ht 63.0 in | Wt 141.0 lb

## 2021-07-14 DIAGNOSIS — R300 Dysuria: Secondary | ICD-10-CM

## 2021-07-14 DIAGNOSIS — F4323 Adjustment disorder with mixed anxiety and depressed mood: Secondary | ICD-10-CM

## 2021-07-14 LAB — URINALYSIS
Bilirubin, UA: NEGATIVE
Glucose, UA: NEGATIVE
Nitrite, UA: NEGATIVE
Specific Gravity, UA: >1.03 — ABNORMAL HIGH (ref 1.005–1.030)
Urobilinogen, Ur: 1 mg/dL (ref 0.2–1.0)
pH, UA: 5 (ref 5.0–7.5)

## 2021-07-14 MED ORDER — APIXABAN 5 MG PO TABS: 5.0000 mg | ORAL_TABLET | Freq: Two times a day (BID) | ORAL | 6 refills | Status: DC

## 2021-07-14 MED ORDER — QUETIAPINE FUMARATE 50 MG PO TABS: 50.0000 mg | ORAL_TABLET | Freq: Every day | ORAL | 3 refills | Status: DC

## 2021-07-14 NOTE — Progress Notes (Signed)
BP 125/81   Pulse (!) 108   Ht '5\' 3"'$  (1.6 m)   Wt 141 lb (64 kg)   LMP 03/21/1971   SpO2 94%   BMI 24.98 kg/m    Subjective:   Patient ID: Emily Wagner, female    DOB: Jun 07, 1933, 87 y.o.   MRN: [id number]  HPI: Emily Wagner is a 87 y.o. female presenting on 07/14/2021 for Medical Management of Chronic Issues (Recent stroke- would like to start back driving) and Dysuria (? uti)   HPI Patient had a recent stroke and has been having some memory issues and doing therapy, still has some left-sided weakness.  She wants to be able to drive and is coming in for assessment for that.  Patient is coming in with dysuria and frequency that is been going on for the past couple days.  She did back off on her bethanechol and the symptoms started happening.  She says is not getting out much when she does go.  Relevant past medical, surgical, family and social history reviewed and updated as indicated. Interim medical history since our last visit reviewed. Allergies and medications reviewed and updated.  Review of Systems  Constitutional:  Negative for chills and fever.  Eyes:  Negative for visual disturbance.  Respiratory:  Negative for chest tightness and shortness of breath.   Cardiovascular:  Negative for chest pain and leg swelling.  Genitourinary:  Positive for difficulty urinating, dysuria and frequency. Negative for decreased urine volume, hematuria, pelvic pain and urgency.  Musculoskeletal:  Negative for back pain and gait problem.  Skin:  Negative for rash.  Neurological:  Positive for weakness. Negative for light-headedness and headaches.  Psychiatric/Behavioral:  Negative for agitation and behavioral problems.   All other systems reviewed and are negative.  Per HPI unless specifically indicated above   Allergies as of 07/14/2021       Reactions   Nitrofurantoin Other (See Comments)   Liver failure   Nsaids Other (See Comments)   Kidney failure   Other Other (See  Comments)   Most antibiotics require supervision per patient   Flonase [fluticasone Propionate] Other (See Comments)   headache   Prevnar 13 [pneumococcal 13-val Conj Vacc] Other (See Comments)   Nerve damage in left arm   Beta Adrenergic Blockers    Nortriptyline Hcl Other (See Comments)   States she felt like worms where crawling under her skin.   Potassium-containing Compounds    Rash and raw areas / sore breast    Alendronate Sodium Other (See Comments)   Ciprofloxacin Other (See Comments)   Patient denies allergy--"I take Cipro for bladder infections"   Codeine Other (See Comments)   Crestor [rosuvastatin Calcium] Other (See Comments)   Darvon Other (See Comments)   Lipitor [atorvastatin Calcium] Other (See Comments)   Pravachol Other (See Comments)   Propranolol Hcl Other (See Comments)   Unknown   Sulfa Antibiotics Other (See Comments)   Sulfamethoxazole Other (See Comments)   Unknown   Ultram [tramadol Hcl] Itching   Welchol [colesevelam Hcl] Other (See Comments)   Zetia [ezetimibe] Other (See Comments)   Zocor [simvastatin] Other (See Comments)        Medication List        Accurate as of July 14, 2021  3:45 PM. If you have any questions, ask your nurse or doctor.          STOP taking these medications    LORazepam 0.5 MG tablet Commonly known  as: ATIVAN Stopped by: Worthy Rancher, MD       TAKE these medications    acetaminophen 325 MG tablet Commonly known as: TYLENOL Take 325 mg by mouth every 6 (six) hours as needed.   amLODipine 5 MG tablet Commonly known as: NORVASC Take 1 tablet (5 mg total) by mouth daily. What changed:  how to take this when to take this additional instructions   apixaban 5 MG Tabs tablet Commonly known as: ELIQUIS Take 1 tablet (5 mg total) by mouth 2 (two) times daily.   aspirin EC 81 MG tablet Take 81 mg by mouth daily.   bethanechol 10 MG tablet Commonly known as: URECHOLINE Take 1 tablet (10 mg  total) by mouth 3 (three) times daily.   calcium carbonate 500 MG chewable tablet Commonly known as: TUMS - dosed in mg elemental calcium Chew 1 tablet by mouth daily.   cephALEXin 500 MG capsule Commonly known as: KEFLEX Take 1 capsule (500 mg total) by mouth 4 (four) times daily.   cholecalciferol 25 MCG (1000 UNIT) tablet Commonly known as: VITAMIN D3 Take 1,000 Units by mouth daily.   famotidine 20 MG tablet Commonly known as: PEPCID TAKE 1 TABLET BY MOUTH  TWICE DAILY   hydrochlorothiazide 25 MG tablet Commonly known as: HYDRODIURIL Take 1 tablet (25 mg total) by mouth daily.   lactose free nutrition Liqd Take 237 mLs by mouth 2 (two) times daily between meals.   levothyroxine 50 MCG tablet Commonly known as: SYNTHROID TAKE 1 TABLET BY MOUTH  DAILY BEFORE BREAKFAST   PRESERVISION AREDS 2 PO Take 1 capsule by mouth at bedtime.   QUEtiapine 50 MG tablet Commonly known as: SEROquel Take 1 tablet (50 mg total) by mouth at bedtime.   Salonpas 3.12-02-08 % Ptch Generic drug: Camphor-Menthol-Methyl Sal Apply topically.   senna 8.6 MG Tabs tablet Commonly known as: SENOKOT Take 1 tablet by mouth.   tiZANidine 4 MG tablet Commonly known as: Zanaflex Take 0.5 tablets (2 mg total) by mouth every 8 (eight) hours as needed for muscle spasms.   VITAMIN B-12 PO Take 500 mcg by mouth every morning.         Objective:   BP 125/81   Pulse (!) 108   Ht '5\' 3"'$  (1.6 m)   Wt 141 lb (64 kg)   LMP 03/21/1971   SpO2 94%   BMI 24.98 kg/m   Wt Readings from Last 3 Encounters:  07/14/21 141 lb (64 kg)  07/06/21 139 lb (63 kg)  05/06/21 152 lb (68.9 kg)    Physical Exam Vitals and nursing note reviewed.  Constitutional:      General: She is not in acute distress.    Appearance: She is well-developed. She is not diaphoretic.  Eyes:     Conjunctiva/sclera: Conjunctivae normal.  Cardiovascular:     Rate and Rhythm: Normal rate and regular rhythm.     Heart sounds:  Normal heart sounds. No murmur heard. Pulmonary:     Effort: Pulmonary effort is normal. No respiratory distress.     Breath sounds: Normal breath sounds. No wheezing.  Abdominal:     General: Abdomen is flat. Bowel sounds are normal. There is no distension.     Tenderness: There is no abdominal tenderness. There is no guarding or rebound.  Musculoskeletal:        General: No tenderness. Normal range of motion.  Skin:    General: Skin is warm and dry.     Findings:  No rash.  Neurological:     Mental Status: She is alert and oriented to person, place, and time.     Coordination: Coordination normal.  Psychiatric:        Behavior: Behavior normal.    U/a: 1+ bilirubin, 1+ ketone, trace blood, trace protein, trace leukocytes  Assessment & Plan:   Problem List Items Addressed This Visit       Other   Adjustment disorder with mixed anxiety and depressed mood   Relevant Medications   QUEtiapine (SEROQUEL) 50 MG tablet   Other Visit Diagnoses     Dysuria    -  Primary   Relevant Orders   Urine Culture   Urinalysis (Completed)     Patient's urine shows that she is likely dehydrated, will await culture but no major signs of infection at this point.  Patient has a lot of weakness and memory issues that family and therapy has been concerned about, I do not think she is safe to drive at this point.  If she wants therapy to reassess then she can or we can do full memory testing in the future.  Patient is very upset that would not fill up or drive but at this point I do not feel like she is safe.  Follow up plan: Return if symptoms worsen or fail to improve.  Counseling provided for all of the vaccine components Orders Placed This Encounter  Procedures   Urine Culture   Urinalysis    Caryl Pina, MD [facility] 07/14/2021, 3:45 PM

## 2021-07-14 NOTE — Telephone Encounter (Signed)
Spoke with pt daughter, terry, aware of dr hochrein's recommendations. New script sent to the pharmacy, Follow up scheduled and Lab orders mailed to the pt.

## 2021-07-15 ENCOUNTER — Telehealth: Payer: Self-pay | Admitting: Family Medicine

## 2021-07-15 NOTE — Telephone Encounter (Signed)
Pt wants to know when Dr. Warrick Parisian plans to give her license back to her because she does not see the point in coming to see him if he does not give it back to her.

## 2021-07-16 LAB — URINE CULTURE

## 2021-07-18 ENCOUNTER — Encounter: Payer: Self-pay | Admitting: Neurology

## 2021-07-18 ENCOUNTER — Ambulatory Visit: Payer: Medicare Other | Admitting: Neurology

## 2021-07-18 VITALS — BP 129/78 | HR 75 | Ht 64.0 in | Wt 138.4 lb

## 2021-07-18 DIAGNOSIS — I480 Paroxysmal atrial fibrillation: Secondary | ICD-10-CM

## 2021-07-18 DIAGNOSIS — I63231 Cerebral infarction due to unspecified occlusion or stenosis of right carotid arteries: Secondary | ICD-10-CM | POA: Diagnosis not present

## 2021-07-18 NOTE — Progress Notes (Signed)
[facility] [address] [place]. [place] [address] [phone]       OFFICE CONSULT NOTE  [name] Date of Birth:  [birth year] Medical Record Number:  [id number]   Referring MD: Vonna Kotyk [name]  Reason for Referral: Stroke  HPI: Emily Wagner is a pleasant 87 year old Caucasian lady seen today for initial office consultation visit for stroke.  History is obtained from the patient and her daughter is accompanying her as well as review of electronic medical records in epic as well as care everywhere.  Pertinent imaging films were also reviewed.  She has past medical history of coronary artery disease, hypertension, hyperlipidemia, osteopenia who presented on 03/27/2021 with being found by family on the floor near her recliner at home.  Her last known well was at 1930 hrs. on 03/26/2021.  She lives alone but is independent of most actives of daily living and does walk with a walker.  On EMS arrival she had right gaze deviation with left facial droop and left hemiparesis.  CT scan showed right temporal, insular and basal ganglia hypodensities and CT angiogram of the head and neck showed occlusion of the right internal carotid artery just beyond the bulb extending to the supraclinoid portion and CT perfusion scan showed a large penumbra.  Patient's family consented for mechanical thrombectomy but since there was another patient on the table at [facility] she had to be emergently  transferred to [facility] using interventional radiology transfer line after discussing the case with Dr. Arizona Constable who accepted the patient for emergent transfer and performed emergent thrombectomy resulting in TICI 2C final revascularization.  Review of records at [facility] in [place].  Initial NIH stroke scale on admission was 14.  MRI scan showed moderate sized acute infarct involving right basal ganglia, insula and right temporoparietal lobes with tiny acute infarct in  the right cerebellum with petechial hemorrhage but no frank hematoma.  Transthoracic echo showed left ventricular ejection fraction of 60 to 65%.  Hemoglobin A1c is 5.3.  Lipid profile was 141.  Patient was transferred to inpatient rehab at [facility] skilled nursing facility for 2 and half weeks and subsequently recovered to go back home and live independently.  Her NIH stroke scale was improved to 2 at discharge.  Patient is able to walk with a walker.  She states that most of her strength has improved but her stamina has not she gets tired more easily particularly when she is walking for a while and has to rest.  She is no longer able to do finances as it has been some cognitive deterioration noted and the daughter has taken that over.  Patient was subsequently found to have paroxysmal A. fib on a 30-day heart monitor and now has been switched to Eliquis.  She does complain of some minor bruising but no bleeding.  Blood pressure is well controlled today it is 129/78.  He has not had any recurrent stroke or TIA symptoms.   ROS:   14 system review of systems is positive for decreased stamina, fatigue, decreased hearing, imbalance, decreased memory all other systems negative  PMH:  Past Medical History:  Diagnosis Date   Adjustment disorder with mixed anxiety and depressed mood    Atrial fibrillation ([place] [facility]) 11/04/2015   Post-operative   Benign hypertensive heart disease without heart failure    CAD (coronary artery disease)    Infection of urinary tract 10/31/2015   Interstitial cystitis    Osteopenia  Other and unspecified hyperlipidemia    Other dyspnea and respiratory abnormality    Pelvic fracture ([facility]) 05/30/2015   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 assistant  Tobacco Use   Smoking status: Never   Smokeless tobacco: Never  Vaping Use   Vaping Use: Never used  Substance and Sexual Activity   Alcohol use: No   Drug use: No   Sexual activity: Not on file  Other Topics Concern   Not on file  Social History Narrative   Lives with alone   Right Handed   Drinks no caffeine daily   Social Determinants of Health   Financial Resource Strain: Not on file  Food Insecurity: Not on file  Transportation Needs: Not on file  Physical Activity: Not on file  Stress: Not on file  Social Connections: Not on file  Intimate Partner Violence: Not on file    Medications:   Current Outpatient Medications on File Prior to Visit  Medication Sig Dispense Refill   acetaminophen (TYLENOL) 325 MG tablet Take 325 mg by mouth every 6 (six) hours as needed.     amLODipine (NORVASC) 5 MG tablet Take 1 tablet (5 mg total) by mouth daily. 90 tablet 3   apixaban (ELIQUIS) 5 MG TABS tablet Take 1 tablet (5 mg total) by mouth 2 (two) times daily. 60 tablet 6   bethanechol (URECHOLINE) 10 MG tablet Take 1 tablet (10 mg total) by mouth 3 (three) times daily. 270 tablet 3   calcium carbonate (TUMS - DOSED IN MG ELEMENTAL CALCIUM) 500 MG chewable tablet Chew 1 tablet by mouth daily.     Camphor-Menthol-Methyl Sal (SALONPAS) 3.12-02-08 % PTCH Apply topically.     cholecalciferol (VITAMIN D3) 25 MCG (1000 UNIT) tablet Take 1,000 Units by mouth daily.     Cyanocobalamin (VITAMIN B-12 PO) Take 500 mcg by mouth every morning.     famotidine (PEPCID) 20 MG tablet TAKE 1 TABLET BY MOUTH  TWICE DAILY 180 tablet 3   hydrochlorothiazide (HYDRODIURIL) 25 MG tablet Take 1 tablet (25 mg total) by mouth daily. 90 tablet 3   levothyroxine (SYNTHROID) 50 MCG tablet TAKE 1 TABLET BY MOUTH  DAILY BEFORE BREAKFAST 90 tablet 3   Multiple Vitamins-Minerals (PRESERVISION AREDS 2 PO) Take 1  capsule by mouth at bedtime.     QUEtiapine (SEROQUEL) 50 MG tablet Take 1 tablet (50 mg total) by mouth at bedtime. 90 tablet 3   senna (SENOKOT) 8.6 MG TABS tablet Take 1 tablet by mouth.     tiZANidine (ZANAFLEX) 4 MG tablet Take 0.5 tablets (2 mg total) by mouth every 8 (eight) hours as needed for muscle spasms. 30 tablet 0   Current Facility-Administered Medications on File Prior to Visit  Medication Dose Route Frequency Provider Last Rate Last Admin   methylPREDNISolone acetate (DEPO-MEDROL) injection 80 mg  80 mg Intramuscular Once [name], [name], MD        Allergies:   Allergies  Allergen Reactions   Nitrofurantoin Other (See Comments)    Liver failure   Nsaids Other (See  Comments)    Kidney failure   Other Other (See Comments)    Most antibiotics require supervision per patient   Flonase [Fluticasone Propionate] Other (See Comments)    headache   Prevnar 13 [Pneumococcal 13-Val Conj Vacc] Other (See Comments)    Nerve damage in left arm   Beta Adrenergic Blockers    Nortriptyline Hcl Other (See Comments)    States she felt like worms where crawling under her skin.   Potassium-Containing Compounds     Rash and raw areas / sore breast    Alendronate Sodium Other (See Comments)   Ciprofloxacin Other (See Comments)    Patient denies allergy--"I take Cipro for bladder infections"   Codeine Other (See Comments)   Crestor [Rosuvastatin Calcium] Other (See Comments)   Darvon Other (See Comments)   Lipitor [Atorvastatin Calcium] Other (See Comments)   Pravachol Other (See Comments)   Propranolol Hcl Other (See Comments)    Unknown   Sulfa Antibiotics Other (See Comments)   Sulfamethoxazole Other (See Comments)    Unknown   Ultram [Tramadol Hcl] Itching   Welchol [Colesevelam Hcl] Other (See Comments)   Zetia [Ezetimibe] Other (See Comments)   Zocor [Simvastatin] Other (See Comments)    Physical Exam General: Pleasant frail elderly Caucasian lady, seated, in no  evident distress Head: head normocephalic and atraumatic.   Neck: supple with no carotid or supraclavicular bruits Cardiovascular: regular rate and rhythm, no murmurs Musculoskeletal: no deformity Skin:  no rash/petichiae Vascular:  Normal pulses all extremities  Neurologic Exam Mental Status: Awake and fully alert. Oriented to place and time. Recent and remote memory intact. Attention span, concentration and fund of knowledge appropriate. Mood and affect appropriate.  Cranial Nerves: Fundoscopic exam reveals sharp disc margins. Pupils equal, briskly reactive to light. Extraocular movements full without nystagmus. Visual fields full to confrontation. Hearing significantly diminished bilaterally facial sensation intact. Face, tongue, palate moves normally and symmetrically.  Motor: Normal bulk and tone. Normal strength in all tested extremity muscles.  Except mild weakness of left grip and intrinsic hand muscles and left ankle dorsiflexors.  Subtle left lower extremity drift.  Fine finger movements are diminished on the left.  Orbits right over left upper extremity. Sensory.: intact to touch , pinprick , position and vibratory sensation.  Coordination: Rapid alternating movements normal in all extremities. Finger-to-nose and heel-to-shin performed accurately bilaterally. Gait and Station: Arises from chair without difficulty. Stance is stooped.  Uses a walker.  Gait demonstrates normal stride length and balance .  Tandem walking not attempted.   Reflexes: 1+ and symmetric. Toes downgoing.   NIHSS  1 Modified Rankin  2   ASSESSMENT: 87 year old Caucasian lady with right MCA infarct secondary to right carotid occlusion likely from paroxysmal A. fib and May 2022 treated with emergent mechanical thrombectomy with TICI 2 C vascularization and excellent clinical outcome.  Vascular risk factors of hypertension, hyperlipidemia, paroxysmal A. fib and age     PLAN: I had a long d/w patient and her  daughter about her recent stroke, right carotid occlusion and emergent thrombectomy with excellent clinical outcome , recent diagnosis of atrial fibrillation and need for long-term anticoagulation, risk for recurrent stroke/TIAs, personally independently reviewed imaging studies and stroke evaluation results and answered questions.Continue Eliquis (apixaban) 5 mg twice daily  for secondary stroke prevention and maintain strict control of hypertension with blood pressure goal below 130/90, diabetes with hemoglobin A1c goal below 6.5% and lipids with LDL cholesterol goal below 70 mg/dL. I also advised the  patient to eat a healthy diet with plenty of whole grains, cereals, fruits and vegetables, exercise regularly and maintain ideal body weight .continue physical and occupational therapy and use of wheeled walker at all times and we discussed fall and safety precautions.  Followup in the future with my nurse practitioner Janett Billow in 3 months or call earlier if necessary.  Greater than 50% time during this 45-minute visit was spent on counseling and coordination of care about her embolic stroke and discussion about stroke prevention and treatment and answering questions Antony Contras, MD  Note: This document was prepared with digital dictation and possible smart phrase technology. Any transcriptional errors that result from this process are unintentional.

## 2021-07-18 NOTE — Telephone Encounter (Signed)
Returning nurse call.

## 2021-07-18 NOTE — Telephone Encounter (Signed)
Left message to return call 

## 2021-07-18 NOTE — Telephone Encounter (Signed)
Pt informed about having another assessment at her follow up. She will consider therapy but there is a $30 copay and she is unable to pay that every time. Pt also needs assistance with transportation because her daughter is unable to take her to all of her appts. Advised pt to call the [facility] to see if they can give her some guidance.

## 2021-07-18 NOTE — Patient Instructions (Signed)
I had a long d/w patient and her daughter about her recent stroke, right carotid occlusion and emergent thrombectomy with excellent clinical outcome , recent diagnosis of atrial fibrillation and need for long-term anticoagulation, risk for recurrent stroke/TIAs, personally independently reviewed imaging studies and stroke evaluation results and answered questions.Continue Eliquis (apixaban) 5 mg twice daily  for secondary stroke prevention and maintain strict control of hypertension with blood pressure goal below 130/90, diabetes with hemoglobin A1c goal below 6.5% and lipids with LDL cholesterol goal below 70 mg/dL. I also advised the patient to eat a healthy diet with plenty of whole grains, cereals, fruits and vegetables, exercise regularly and maintain ideal body weight .continue physical and occupational therapy and use of wheeled walker at all times and we discussed fall and safety precautions.  Followup in the future with my nurse practitioner Janett Billow in 3 months or call earlier if necessary. Fall Prevention in the Home, Adult Falls can cause injuries and can happen to people of all ages. There are many things you can do to make your home safe and to help prevent falls. Ask forhelp when making these changes. What actions can I take to prevent falls? General Instructions Use good lighting in all rooms. Replace any light bulbs that burn out. Turn on the lights in dark areas. Use night-lights. Keep items that you use often in easy-to-reach places. Lower the shelves around your home if needed. Set up your furniture so you have a clear path. Avoid moving your furniture around. Do not have throw rugs or other things on the floor that can make you trip. Avoid walking on wet floors. If any of your floors are uneven, fix them. Add color or contrast paint or tape to clearly mark and help you see: Grab bars or handrails. First and last steps of staircases. Where the edge of each step is. If you use a  stepladder: Make sure that it is fully opened. Do not climb a closed stepladder. Make sure the sides of the stepladder are locked in place. Ask someone to hold the stepladder while you use it. Know where your pets are when moving through your home. What can I do in the bathroom?     Keep the floor dry. Clean up any water on the floor right away. Remove soap buildup in the tub or shower. Use nonskid mats or decals on the floor of the tub or shower. Attach bath mats securely with double-sided, nonslip rug tape. If you need to sit down in the shower, use a plastic, nonslip stool. Install grab bars by the toilet and in the tub and shower. Do not use towel bars as grab bars. What can I do in the bedroom? Make sure that you have a light by your bed that is easy to reach. Do not use any sheets or blankets for your bed that hang to the floor. Have a firm chair with side arms that you can use for support when you get dressed. What can I do in the kitchen? Clean up any spills right away. If you need to reach something above you, use a step stool with a grab bar. Keep electrical cords out of the way. Do not use floor polish or wax that makes floors slippery. What can I do with my stairs? Do not leave any items on the stairs. Make sure that you have a light switch at the top and the bottom of the stairs. Make sure that there are handrails on both sides of  the stairs. Fix handrails that are broken or loose. Install nonslip stair treads on all your stairs. Avoid having throw rugs at the top or bottom of the stairs. Choose a carpet that does not hide the edge of the steps on the stairs. Check carpeting to make sure that it is firmly attached to the stairs. Fix carpet that is loose or worn. What can I do on the outside of my home? Use bright outdoor lighting. Fix the edges of walkways and driveways and fix any cracks. Remove anything that might make you trip as you walk through a door, such as a  raised step or threshold. Trim any bushes or trees on paths to your home. Check to see if handrails are loose or broken and that both sides of all steps have handrails. Install guardrails along the edges of any raised decks and porches. Clear paths of anything that can make you trip, such as tools or rocks. Have leaves, snow, or ice cleared regularly. Use sand or salt on paths during winter. Clean up any spills in your garage right away. This includes grease or oil spills. What other actions can I take? Wear shoes that: Have a low heel. Do not wear high heels. Have rubber bottoms. Feel good on your feet and fit well. Are closed at the toe. Do not wear open-toe sandals. Use tools that help you move around if needed. These include: Canes. Walkers. Scooters. Crutches. Review your medicines with your doctor. Some medicines can make you feel dizzy. This can increase your chance of falling. Ask your doctor what else you can do to help prevent falls. Where to find more information Centers for Disease Control and Prevention, STEADI: http://www.wolf.info/ National Institute on Aging: http://kim-miller.com/ Contact a doctor if: You are afraid of falling at home. You feel weak, drowsy, or dizzy at home. You fall at home. Summary There are many simple things that you can do to make your home safe and to help prevent falls. Ways to make your home safe include removing things that can make you trip and installing grab bars in the bathroom. Ask for help when making these changes in your home. This information is not intended to replace advice given to you by your health care provider. Make sure you discuss any questions you have with your healthcare provider. Document Revised: 06/16/2020 Document Reviewed: 06/16/2020 Elsevier Patient Education  [place]. Stroke Prevention Some medical conditions and behaviors are associated with a higher chance of having a stroke. You can help prevent a stroke by  making nutrition, lifestyle,and other changes, including managing any medical conditions you may have. What nutrition changes can be made?  Eat healthy foods. You can do this by: Choosing foods high in fiber, such as fresh fruits and vegetables and whole grains. Eating at least 5 or more servings of fruits and vegetables a day. Try to fill half of your plate at each meal with fruits and vegetables. Choosing lean protein foods, such as lean cuts of meat, poultry without skin, fish, tofu, beans, and nuts. Eating low-fat dairy products. Avoiding foods that are high in salt (sodium). This can help lower blood pressure. Avoiding foods that have saturated fat, trans fat, and cholesterol. This can help prevent high cholesterol. Avoiding processed and premade foods. Follow your health care provider's specific guidelines for losing weight, controlling high blood pressure (hypertension), lowering high cholesterol, and managing diabetes. These may include: Reducing your daily calorie intake. Limiting your daily sodium intake to 1,500 milligrams (  mg). Using only healthy fats for cooking, such as olive oil, canola oil, or sunflower oil. Counting your daily carbohydrate intake. What lifestyle changes can be made? Maintain a healthy weight. Talk to your health care provider about your ideal weight. Get at least 30 minutes of moderate physical activity at least 5 days a week. Moderate activity includes brisk walking, biking, and swimming. Do not use any products that contain nicotine or tobacco, such as cigarettes and e-cigarettes. If you need help quitting, ask your health care provider. It may also be helpful to avoid exposure to secondhand smoke. Limit alcohol intake to no more than 1 drink a day for nonpregnant women and 2 drinks a day for men. One drink equals 12 oz of beer, 5 oz of wine, or 1 oz of hard liquor. Stop any illegal drug use. Avoid taking birth control pills. Talk to your health care  provider about the risks of taking birth control pills if: You are over 68 years old. You smoke. You get migraines. You have ever had a blood clot. What other changes can be made? Manage your cholesterol levels. Eating a healthy diet is important for preventing high cholesterol. If cholesterol cannot be managed through diet alone, you may also need to take medicines. Take any prescribed medicines to control your cholesterol as told by your health care provider. Manage your diabetes. Eating a healthy diet and exercising regularly are important parts of managing your blood sugar. If your blood sugar cannot be managed through diet and exercise, you may need to take medicines. Take any prescribed medicines to control your diabetes as told by your health care provider. Control your hypertension. To reduce your risk of stroke, try to keep your blood pressure below 130/80. Eating a healthy diet and exercising regularly are an important part of controlling your blood pressure. If your blood pressure cannot be managed through diet and exercise, you may need to take medicines. Take any prescribed medicines to control hypertension as told by your health care provider. Ask your health care provider if you should monitor your blood pressure at home. Have your blood pressure checked every year, even if your blood pressure is normal. Blood pressure increases with age and some medical conditions. Get evaluated for sleep disorders (sleep apnea). Talk to your health care provider about getting a sleep evaluation if you snore a lot or have excessive sleepiness. Take over-the-counter and prescription medicines only as told by your health care provider. Aspirin or blood thinners (antiplatelets or anticoagulants) may be recommended to reduce your risk of forming blood clots that can lead to stroke. Make sure that any other medical conditions you have, such as atrial fibrillation or atherosclerosis, are managed. What  are the warning signs of a stroke? The warning signs of a stroke can be easily remembered as BEFAST. B is for balance. Signs include: Dizziness. Loss of balance or coordination. Sudden trouble walking. E is for eyes. Signs include: A sudden change in vision. Trouble seeing. F is for face. Signs include: Sudden weakness or numbness of the face. The face or eyelid drooping to one side. A is for arms. Signs include: Sudden weakness or numbness of the arm, usually on one side of the body. S is for speech. Signs include: Trouble speaking (aphasia). Trouble understanding. T is for time. These symptoms may represent a serious problem that is an emergency. Do not wait to see if the symptoms will go away. Get medical help right away. Call your local emergency services (  911 in the [place] Do not drive yourself to the hospital. Other signs of stroke may include: A sudden, severe headache with no known cause. Nausea or vomiting. Seizure. Where to find more information For more information, visit: American Stroke Association: www.strokeassociation.org National Stroke Association: www.stroke.org Summary You can prevent a stroke by eating healthy, exercising, not smoking, limiting alcohol intake, and managing any medical conditions you may have. Do not use any products that contain nicotine or tobacco, such as cigarettes and e-cigarettes. If you need help quitting, ask your health care provider. It may also be helpful to avoid exposure to secondhand smoke. Remember BEFAST for warning signs of stroke. Get help right away if you or a loved one has any of these signs. This information is not intended to replace advice given to you by your health care provider. Make sure you discuss any questions you have with your healthcare provider. Document Revised: 10/26/2017 Document Reviewed: 12/19/2016 Elsevier Patient Education  2021 [employer].

## 2021-07-18 NOTE — Telephone Encounter (Signed)
Returning nurse call again

## 2021-07-18 NOTE — Telephone Encounter (Signed)
Attempted to return call - NA 

## 2021-07-18 NOTE — Telephone Encounter (Signed)
I understand that she is upset with me but I do not that she is safe, we can always reassess at a future visit but I cannot promise when she may become safe to drive again, I did discuss with her getting reassessed by the therapy and coming in to do a full memory test, she can also do this as part of an annual wellness visit

## 2021-07-19 ENCOUNTER — Telehealth: Payer: Self-pay | Admitting: *Deleted

## 2021-07-19 NOTE — Telephone Encounter (Signed)
Pt is reaching out to find out if you're able to expedite assistance forms for Eliquis.. please advise

## 2021-07-19 NOTE — Telephone Encounter (Signed)
Spoke with patient and advised forms mailed 8/17 Offered to get samples if [facility] has any, get forms printed for patients assistance and leave at desk for pick up at [facility] office  She would like her daughter Johnston Ebbs [phone] called when ready for pick up, she works in [place]  Patient no longer drives since she had her stroke  Will forward to Alyse Low for assistance with samples and forms

## 2021-07-20 ENCOUNTER — Telehealth: Payer: Self-pay

## 2021-07-20 NOTE — Telephone Encounter (Signed)
   Cardiac Monitor Alert  Date of alert:  07/20/2021   Patient Name: Emily Wagner  DOB: 1933/10/03  MRN: [id number]    [facility] Cardiologist: None  [facility] EP:  None    Monitor Information: Cardiac Event Monitor [[facility]]  Reason:  Serious Alert  Ordering provider:  Hochrein    Alert Sinus Tachycardia with PSVT (18 sec) This is the 1st alert for this rhythm.   Next Cardiology Appointment   Date:  08/17/2021   Provider:  Percival Spanish  The patient was contacted today.  She is asymptomatic. Arrhythmia, symptoms and history reviewed with previous stroke; .   Plan:  given to Hochrein, DOD, to review   Other: Patient to continue with plan and appreciative of call.   Newt Minion, RN  07/20/2021 11:59 AM

## 2021-07-20 NOTE — Telephone Encounter (Signed)
Patient called with questions of how to fax [name] the paper work for her Eliquis patient assistance. Gave her the office fax and asked that it be put attention Dr. Percival Spanish. Patient verbalized understanding no additional questions at this time.

## 2021-07-21 ENCOUNTER — Telehealth: Payer: Self-pay | Admitting: Family Medicine

## 2021-07-21 DIAGNOSIS — I4891 Unspecified atrial fibrillation: Secondary | ICD-10-CM | POA: Diagnosis not present

## 2021-07-21 DIAGNOSIS — Z9181 History of falling: Secondary | ICD-10-CM | POA: Diagnosis not present

## 2021-07-21 DIAGNOSIS — I69354 Hemiplegia and hemiparesis following cerebral infarction affecting left non-dominant side: Secondary | ICD-10-CM | POA: Diagnosis not present

## 2021-07-21 DIAGNOSIS — Z87440 Personal history of urinary (tract) infections: Secondary | ICD-10-CM | POA: Diagnosis not present

## 2021-07-21 DIAGNOSIS — E039 Hypothyroidism, unspecified: Secondary | ICD-10-CM | POA: Diagnosis not present

## 2021-07-21 DIAGNOSIS — E785 Hyperlipidemia, unspecified: Secondary | ICD-10-CM | POA: Diagnosis not present

## 2021-07-21 DIAGNOSIS — M810 Age-related osteoporosis without current pathological fracture: Secondary | ICD-10-CM | POA: Diagnosis not present

## 2021-07-21 DIAGNOSIS — F32A Depression, unspecified: Secondary | ICD-10-CM | POA: Diagnosis not present

## 2021-07-21 DIAGNOSIS — I10 Essential (primary) hypertension: Secondary | ICD-10-CM | POA: Diagnosis not present

## 2021-07-21 DIAGNOSIS — M542 Cervicalgia: Secondary | ICD-10-CM | POA: Diagnosis not present

## 2021-07-21 DIAGNOSIS — Z7982 Long term (current) use of aspirin: Secondary | ICD-10-CM | POA: Diagnosis not present

## 2021-07-21 DIAGNOSIS — I2510 Atherosclerotic heart disease of native coronary artery without angina pectoris: Secondary | ICD-10-CM | POA: Diagnosis not present

## 2021-07-21 NOTE — Telephone Encounter (Signed)
I spoke with daughter and she had questions regarding memory issues and if it was safe for pt to drive. Advised according to note in Dr [name] last visit he didn't think it was safe for her to drive at this point and she could schedule an appt with [name] or the neurologist for MMSE and pt's daughter voiced understanding.

## 2021-07-22 NOTE — Telephone Encounter (Signed)
Spoke with pt daughter, aware samples placed at the front desk for pick up. She is going to bring the patient assistance paperwork when she picks up the samples.

## 2021-07-26 ENCOUNTER — Telehealth: Payer: Self-pay | Admitting: Family Medicine

## 2021-07-26 NOTE — Telephone Encounter (Signed)
Do not see where referral was placed. Please advise

## 2021-07-26 NOTE — Telephone Encounter (Signed)
Pt called in to check on a referral for neurology because she said Dr. Warrick Parisian wanted her to go see the neurologist before her next appt with him. Wanted to talk to the nurse because she did not understand why an appt was not made. Please call back and advise.

## 2021-07-27 ENCOUNTER — Ambulatory Visit: Payer: Medicare Other | Admitting: Family Medicine

## 2021-07-27 NOTE — Telephone Encounter (Signed)
Patient aware and verbalizes understanding. 

## 2021-07-27 NOTE — Telephone Encounter (Signed)
Appointment was not made for neurology because she already has a neurologist and I had instructed her to call the neurologist and make the appointment on her own.  It looks like she sees Dr. Leonie Man

## 2021-07-28 ENCOUNTER — Ambulatory Visit: Payer: Medicare Other | Admitting: Family Medicine

## 2021-07-28 ENCOUNTER — Telehealth: Payer: Self-pay | Admitting: Neurology

## 2021-07-28 DIAGNOSIS — E785 Hyperlipidemia, unspecified: Secondary | ICD-10-CM | POA: Diagnosis not present

## 2021-07-28 DIAGNOSIS — F32A Depression, unspecified: Secondary | ICD-10-CM | POA: Diagnosis not present

## 2021-07-28 DIAGNOSIS — Z9181 History of falling: Secondary | ICD-10-CM | POA: Diagnosis not present

## 2021-07-28 DIAGNOSIS — I69354 Hemiplegia and hemiparesis following cerebral infarction affecting left non-dominant side: Secondary | ICD-10-CM | POA: Diagnosis not present

## 2021-07-28 DIAGNOSIS — I2510 Atherosclerotic heart disease of native coronary artery without angina pectoris: Secondary | ICD-10-CM | POA: Diagnosis not present

## 2021-07-28 DIAGNOSIS — I10 Essential (primary) hypertension: Secondary | ICD-10-CM | POA: Diagnosis not present

## 2021-07-28 DIAGNOSIS — Z87440 Personal history of urinary (tract) infections: Secondary | ICD-10-CM | POA: Diagnosis not present

## 2021-07-28 DIAGNOSIS — I4891 Unspecified atrial fibrillation: Secondary | ICD-10-CM | POA: Diagnosis not present

## 2021-07-28 DIAGNOSIS — E039 Hypothyroidism, unspecified: Secondary | ICD-10-CM | POA: Diagnosis not present

## 2021-07-28 DIAGNOSIS — Z7982 Long term (current) use of aspirin: Secondary | ICD-10-CM | POA: Diagnosis not present

## 2021-07-28 DIAGNOSIS — M542 Cervicalgia: Secondary | ICD-10-CM | POA: Diagnosis not present

## 2021-07-28 DIAGNOSIS — M810 Age-related osteoporosis without current pathological fracture: Secondary | ICD-10-CM | POA: Diagnosis not present

## 2021-07-28 NOTE — Telephone Encounter (Signed)
Returned daughter's call (on [place]).  Daughter [name] stated that she was requested an earlier appointment to have her mom's memory tested so she could regain her driving privileges.  She stated she does not drive in the city but just to [place] and get her hair done and groceries occasionally.    Patient denied further questions, verbalized understanding and expressed appreciation for the phone call.

## 2021-07-28 NOTE — Telephone Encounter (Signed)
Pt's daughter [name] called wanting to know if Dr. Leonie Man can do a full memory assessment on her mother in order for her to be able to drive. Pt's daughter says if the PCP needs to do it she will follow up with them. [name] is requesting a call back.

## 2021-08-02 ENCOUNTER — Other Ambulatory Visit: Payer: Self-pay

## 2021-08-02 ENCOUNTER — Other Ambulatory Visit: Payer: Medicare Other

## 2021-08-02 ENCOUNTER — Telehealth: Payer: Self-pay | Admitting: Cardiology

## 2021-08-02 ENCOUNTER — Telehealth: Payer: Self-pay | Admitting: Family Medicine

## 2021-08-02 DIAGNOSIS — I480 Paroxysmal atrial fibrillation: Secondary | ICD-10-CM | POA: Diagnosis not present

## 2021-08-02 NOTE — Telephone Encounter (Signed)
Pt state daughter dropped off patient assistance forms for Dr. Warren Lacy to complete last week and wanted to check the status. Nurse advise pt we will check MD's box and call her back once they are completed.

## 2021-08-02 NOTE — Telephone Encounter (Signed)
Pt c/o medication issue:  1. Name of Medication: Eliquis   2. How are you currently taking this medication (dosage and times per day)? BID  3. Are you having a reaction (difficulty breathing--STAT)? No   4. What is your medication issue? Patient daughter dropped some papers of for the doctor to fill out for assistants. Please advise.

## 2021-08-03 DIAGNOSIS — E785 Hyperlipidemia, unspecified: Secondary | ICD-10-CM | POA: Diagnosis not present

## 2021-08-03 DIAGNOSIS — I69354 Hemiplegia and hemiparesis following cerebral infarction affecting left non-dominant side: Secondary | ICD-10-CM | POA: Diagnosis not present

## 2021-08-03 DIAGNOSIS — M542 Cervicalgia: Secondary | ICD-10-CM | POA: Diagnosis not present

## 2021-08-03 DIAGNOSIS — Z9181 History of falling: Secondary | ICD-10-CM | POA: Diagnosis not present

## 2021-08-03 DIAGNOSIS — F32A Depression, unspecified: Secondary | ICD-10-CM | POA: Diagnosis not present

## 2021-08-03 DIAGNOSIS — M810 Age-related osteoporosis without current pathological fracture: Secondary | ICD-10-CM | POA: Diagnosis not present

## 2021-08-03 DIAGNOSIS — I2510 Atherosclerotic heart disease of native coronary artery without angina pectoris: Secondary | ICD-10-CM | POA: Diagnosis not present

## 2021-08-03 DIAGNOSIS — I4891 Unspecified atrial fibrillation: Secondary | ICD-10-CM | POA: Diagnosis not present

## 2021-08-03 DIAGNOSIS — Z87440 Personal history of urinary (tract) infections: Secondary | ICD-10-CM | POA: Diagnosis not present

## 2021-08-03 DIAGNOSIS — E039 Hypothyroidism, unspecified: Secondary | ICD-10-CM | POA: Diagnosis not present

## 2021-08-03 DIAGNOSIS — I10 Essential (primary) hypertension: Secondary | ICD-10-CM | POA: Diagnosis not present

## 2021-08-03 DIAGNOSIS — Z7982 Long term (current) use of aspirin: Secondary | ICD-10-CM | POA: Diagnosis not present

## 2021-08-03 LAB — CBC
Hematocrit: 41.8 % (ref 34.0–46.6)
Hemoglobin: 13.9 g/dL (ref 11.1–15.9)
MCH: 32 pg (ref 26.6–33.0)
MCHC: 33.3 g/dL (ref 31.5–35.7)
MCV: 96 fL (ref 79–97)
Platelets: 234 10*3/uL (ref 150–450)
RBC: 4.34 x10E6/uL (ref 3.77–5.28)
RDW: 12.9 % (ref 11.7–15.4)
WBC: 6.6 10*3/uL (ref 3.4–10.8)

## 2021-08-03 NOTE — Telephone Encounter (Signed)
Yes that is fine, change it to after the memory testing

## 2021-08-03 NOTE — Telephone Encounter (Signed)
Appointment cancelled on 08/18/21 and rescheduled for after her memory test, 09/29/21 at 10:25 am.

## 2021-08-03 NOTE — Telephone Encounter (Signed)
Received notification that Eliquis has been approved through 11/26/2021 Spoke with patient and she has not heard from pharmacy yet to set up delivery  Called spoke with pharmacy and they have tried to call patient She can call to follow up with them at [phone] Tried to call patient back and give her number, no answer  Called daughter and gave her number

## 2021-08-09 DIAGNOSIS — M810 Age-related osteoporosis without current pathological fracture: Secondary | ICD-10-CM | POA: Diagnosis not present

## 2021-08-09 DIAGNOSIS — I2510 Atherosclerotic heart disease of native coronary artery without angina pectoris: Secondary | ICD-10-CM | POA: Diagnosis not present

## 2021-08-09 DIAGNOSIS — Z7982 Long term (current) use of aspirin: Secondary | ICD-10-CM | POA: Diagnosis not present

## 2021-08-09 DIAGNOSIS — Z9181 History of falling: Secondary | ICD-10-CM | POA: Diagnosis not present

## 2021-08-09 DIAGNOSIS — F32A Depression, unspecified: Secondary | ICD-10-CM | POA: Diagnosis not present

## 2021-08-09 DIAGNOSIS — M542 Cervicalgia: Secondary | ICD-10-CM | POA: Diagnosis not present

## 2021-08-09 DIAGNOSIS — I4891 Unspecified atrial fibrillation: Secondary | ICD-10-CM | POA: Diagnosis not present

## 2021-08-09 DIAGNOSIS — I69354 Hemiplegia and hemiparesis following cerebral infarction affecting left non-dominant side: Secondary | ICD-10-CM | POA: Diagnosis not present

## 2021-08-09 DIAGNOSIS — Z87440 Personal history of urinary (tract) infections: Secondary | ICD-10-CM | POA: Diagnosis not present

## 2021-08-09 DIAGNOSIS — E039 Hypothyroidism, unspecified: Secondary | ICD-10-CM | POA: Diagnosis not present

## 2021-08-09 DIAGNOSIS — E785 Hyperlipidemia, unspecified: Secondary | ICD-10-CM | POA: Diagnosis not present

## 2021-08-09 DIAGNOSIS — I10 Essential (primary) hypertension: Secondary | ICD-10-CM | POA: Diagnosis not present

## 2021-08-15 NOTE — Progress Notes (Signed)
Cardiology Office Note   Date:  08/17/2021   ID:  Emily Wagner, DOB 13-Jun-1933, MRN [id number]  PCP:  [name], [name], MD  Cardiologist:   None   Chief Complaint  Patient presents with   Atrial Fibrillation        History of Present Illness: Emily Wagner is a 87 y.o. female who presents for evaluation of atrial fib.     She has a history of  bypass surgery.  She has had fluctuating BPs.  She has a diagnosis of post polio syndrome.   She has chronic fatigue.   She had an acute CVA in May.  She was hospitalized at [facility] with acute ischemic right MCA stroke.  She had thrombectomy.      I had her wear a monitor which demonstrated paroxysmal atrial fibrillation.  I saw this soon after she put the monitor on her she actually just took it off the other day.    She does not feel the palpitations and would know that she is in that rhythm.  They are lasting for few to several minutes.  She had about 40 episodes over the 4 weeks and with a monitor.  I reviewed these results with her in the office today.  She does not feel this.  The patient denies any new symptoms such as chest discomfort, neck or arm discomfort. There has been no new shortness of breath, PND or orthopnea. There have been no reported palpitations, presyncope or syncope.    She walks with a cane.  She finished physical therapy and thinks that she is recovered completely from the stroke.    Past Medical History:  Diagnosis Date   Adjustment disorder with mixed anxiety and depressed mood    Atrial fibrillation ([place]) 11/04/2015   Post-operative   Benign hypertensive heart disease without heart failure    CAD (coronary artery disease)    Infection of urinary tract 10/31/2015   Interstitial cystitis    Osteopenia    Other and unspecified hyperlipidemia    Other dyspnea and respiratory abnormality    Pelvic fracture ([facility]) 05/30/2015   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   LIMA to LAD   Stroke [facility])     Past  Surgical History:  Procedure Laterality Date   ABDOMINAL HYSTERECTOMY     Cancer and endometriosis   CARDIAC CATHETERIZATION  2008   CARDIAC CATHETERIZATION N/A 10/29/2015   Procedure: Left Heart Cath and Coronary Angiography;  Surgeon: Belva Crome, MD;  Location: [place] [facility];  Service: Cardiovascular;  Laterality: N/A;   CATARACT EXTRACTION     CHOLECYSTECTOMY     CORONARY ARTERY BYPASS GRAFT N/A 11/02/2015   Procedure: OFF PUMP CORONARY ARTERY BYPASS GRAFTING (CABG) TIMES ONE;  Surgeon: Rexene Alberts, MD;  Location: [place];  Service: Open Heart Surgery;  Laterality: N/A;  LIMA to LAD   FEMUR FRACTURE SURGERY  09/2015   right hip compression screws    FRACTURE SURGERY     Right hip   HERNIA REPAIR     Right inguinal   TEE WITHOUT CARDIOVERSION N/A 11/02/2015   Procedure: TRANSESOPHAGEAL ECHOCARDIOGRAM (TEE);  Surgeon: Rexene Alberts, MD;  Location: [place];  Service: Open Heart Surgery;  Laterality: N/A;     Current Outpatient Medications  Medication Sig Dispense Refill   apixaban (ELIQUIS) 5 MG TABS tablet Take 1 tablet (5 mg total) by mouth 2 (two) times daily. 60 tablet 6  bethanechol (URECHOLINE) 10 MG tablet Take 1 tablet (10 mg total) by mouth 3 (three) times daily. 270 tablet 3   cholecalciferol (VITAMIN D3) 25 MCG (1000 UNIT) tablet Take 1,000 Units by mouth daily.     Cyanocobalamin (VITAMIN B-12 PO) Take 500 mcg by mouth every morning.     famotidine (PEPCID) 20 MG tablet TAKE 1 TABLET BY MOUTH  TWICE DAILY 180 tablet 3   hydrochlorothiazide (HYDRODIURIL) 25 MG tablet Take 1 tablet (25 mg total) by mouth daily. 90 tablet 3   levothyroxine (SYNTHROID) 50 MCG tablet TAKE 1 TABLET BY MOUTH  DAILY BEFORE BREAKFAST 90 tablet 3   Multiple Vitamins-Minerals (PRESERVISION AREDS 2 PO) Take 1 capsule by mouth at bedtime.     QUEtiapine (SEROQUEL) 50 MG tablet Take 1 tablet (50 mg total) by mouth at bedtime. 90 tablet 3   senna (SENOKOT) 8.6 MG TABS tablet Take 1 tablet by  mouth.     acetaminophen (TYLENOL) 325 MG tablet Take 325 mg by mouth every 6 (six) hours as needed.     amLODipine (NORVASC) 5 MG tablet Take 1 tablet (5 mg total) by mouth daily. 90 tablet 3   tiZANidine (ZANAFLEX) 4 MG tablet Take 0.5 tablets (2 mg total) by mouth every 8 (eight) hours as needed for muscle spasms. (Patient not taking: Reported on 08/17/2021) 30 tablet 0   Current Facility-Administered Medications  Medication Dose Route Frequency Provider Last Rate Last Admin   methylPREDNISolone acetate (DEPO-MEDROL) injection 80 mg  80 mg Intramuscular Once [name], [name], MD        Allergies:   Nitrofurantoin, Nsaids, Other, Flonase [fluticasone propionate], Prevnar 13 [pneumococcal 13-val conj vacc], Beta adrenergic blockers, Nortriptyline hcl, Potassium-containing compounds, Alendronate sodium, Ciprofloxacin, Codeine, Crestor [rosuvastatin calcium], Darvon, Lipitor [atorvastatin calcium], Pravachol, Propranolol hcl, Sulfa antibiotics, Sulfamethoxazole, Ultram [tramadol hcl], Welchol [colesevelam hcl], Zetia [ezetimibe], and Zocor [simvastatin]    ROS:  Please see the history of present illness.   Otherwise, review of systems are positive for none.   All other systems are reviewed and negative.    PHYSICAL EXAM: VS:  BP 102/70   Pulse 92   Ht '5\' 4"'$  (1.626 m)   Wt 135 lb (61.2 kg)   LMP 03/21/1971   BMI 23.17 kg/m  , BMI Body mass index is 23.17 kg/m.  GENERAL:  Well appearing NECK:  No jugular venous distention, waveform within normal limits, carotid upstroke brisk and symmetric, no bruits, no thyromegaly LUNGS:  Clear to auscultation bilaterally CHEST:  Unremarkable HEART:  PMI not displaced or sustained,S1 and S2 within normal limits, no S3, no S4, no clicks, no rubs, no murmurs ABD:  Flat, positive bowel sounds normal in frequency in pitch, no bruits, no rebound, no guarding, no midline pulsatile mass, no hepatomegaly, no splenomegaly EXT:  2 plus pulses throughout, no  edema, no cyanosis no clubbing   EKG:  EKG is  not ordered today.    Recent Labs: 12/23/2020: TSH 1.500 03/27/2021: ALT 15; BUN 36; Creatinine, Ser 1.00; Potassium 4.3; Sodium 141 08/02/2021: Hemoglobin 13.9; Platelets 234    Lipid Panel    Component Value Date/Time   CHOL 261 (H) 08/23/2020 1215   TRIG 181 (H) 08/23/2020 1215   TRIG 162 (H) 06/24/2015 0918   HDL 50 08/23/2020 1215   HDL 44 06/24/2015 0918   CHOLHDL 5.2 (H) 08/23/2020 1215   CHOLHDL 5.4 10/31/2015 0215   VLDL 20 10/31/2015 0215   LDLCALC 177 (H) 08/23/2020 1215   LDLCALC  163 (H) 05/14/2014 1038      Wt Readings from Last 3 Encounters:  08/17/21 135 lb (61.2 kg)  07/18/21 138 lb 6.4 oz (62.8 kg)  07/14/21 141 lb (64 kg)      Other studies Reviewed: Additional studies/ records that were reviewed today include:   Monitor. Review of the above records demonstrates:    See elsewhere   ASSESSMENT AND PLAN:   CAD:   The patient has no new sypmtoms.  No further cardiovascular testing is indicated.  We will continue with aggressive risk reduction and meds as listed.  ATRIAL FIB:    The patient has asymptomatic paroxysmal atrial fibrillation.  The episodes were relatively frequent 20 episodes over 4 weeks but not sustained.  She tolerates anticoagulation.  No change in therapy.  DYSLIPIDEMIA:     She is been intolerant of statins.  She has not PCSK9.  No change in therapy.   HTN:  Her BP is controlled.  No change in therapy  CVA:    She has follow-up with neurology and states she is recovered nicely from this.   Current medicines are reviewed at length with the patient today.  The patient does not have concerns regarding medicines.  The following changes have been made: None  Labs/ tests ordered today include:     None  No orders of the defined types were placed in this encounter.      Disposition:   FU with me in 6 months   Signed, Minus Breeding, MD  08/17/2021 3:11 PM    [facility]

## 2021-08-16 ENCOUNTER — Other Ambulatory Visit: Payer: Self-pay | Admitting: Cardiology

## 2021-08-16 ENCOUNTER — Ambulatory Visit: Payer: Medicare Other

## 2021-08-16 DIAGNOSIS — I2510 Atherosclerotic heart disease of native coronary artery without angina pectoris: Secondary | ICD-10-CM

## 2021-08-16 DIAGNOSIS — I4891 Unspecified atrial fibrillation: Secondary | ICD-10-CM

## 2021-08-16 DIAGNOSIS — I491 Atrial premature depolarization: Secondary | ICD-10-CM

## 2021-08-16 DIAGNOSIS — I639 Cerebral infarction, unspecified: Secondary | ICD-10-CM

## 2021-08-16 DIAGNOSIS — Z8673 Personal history of transient ischemic attack (TIA), and cerebral infarction without residual deficits: Secondary | ICD-10-CM

## 2021-08-17 ENCOUNTER — Other Ambulatory Visit: Payer: Self-pay

## 2021-08-17 ENCOUNTER — Ambulatory Visit (INDEPENDENT_AMBULATORY_CARE_PROVIDER_SITE_OTHER): Payer: Medicare Other

## 2021-08-17 ENCOUNTER — Encounter: Payer: Self-pay | Admitting: Cardiology

## 2021-08-17 ENCOUNTER — Ambulatory Visit (INDEPENDENT_AMBULATORY_CARE_PROVIDER_SITE_OTHER): Payer: Medicare Other | Admitting: Cardiology

## 2021-08-17 VITALS — BP 102/70 | HR 92 | Ht 64.0 in | Wt 135.0 lb

## 2021-08-17 DIAGNOSIS — I10 Essential (primary) hypertension: Secondary | ICD-10-CM | POA: Diagnosis not present

## 2021-08-17 DIAGNOSIS — E785 Hyperlipidemia, unspecified: Secondary | ICD-10-CM | POA: Diagnosis not present

## 2021-08-17 DIAGNOSIS — M542 Cervicalgia: Secondary | ICD-10-CM | POA: Diagnosis not present

## 2021-08-17 DIAGNOSIS — Z23 Encounter for immunization: Secondary | ICD-10-CM

## 2021-08-17 DIAGNOSIS — E039 Hypothyroidism, unspecified: Secondary | ICD-10-CM | POA: Diagnosis not present

## 2021-08-17 DIAGNOSIS — I69354 Hemiplegia and hemiparesis following cerebral infarction affecting left non-dominant side: Secondary | ICD-10-CM | POA: Diagnosis not present

## 2021-08-17 DIAGNOSIS — I2510 Atherosclerotic heart disease of native coronary artery without angina pectoris: Secondary | ICD-10-CM

## 2021-08-17 DIAGNOSIS — Z87440 Personal history of urinary (tract) infections: Secondary | ICD-10-CM | POA: Diagnosis not present

## 2021-08-17 DIAGNOSIS — F32A Depression, unspecified: Secondary | ICD-10-CM | POA: Diagnosis not present

## 2021-08-17 DIAGNOSIS — I4891 Unspecified atrial fibrillation: Secondary | ICD-10-CM | POA: Diagnosis not present

## 2021-08-17 DIAGNOSIS — M810 Age-related osteoporosis without current pathological fracture: Secondary | ICD-10-CM | POA: Diagnosis not present

## 2021-08-17 DIAGNOSIS — I639 Cerebral infarction, unspecified: Secondary | ICD-10-CM | POA: Diagnosis not present

## 2021-08-17 DIAGNOSIS — Z9181 History of falling: Secondary | ICD-10-CM | POA: Diagnosis not present

## 2021-08-17 DIAGNOSIS — Z7982 Long term (current) use of aspirin: Secondary | ICD-10-CM | POA: Diagnosis not present

## 2021-08-17 MED ORDER — AMLODIPINE BESYLATE 5 MG PO TABS: 5.0000 mg | ORAL_TABLET | Freq: Every day | ORAL | 3 refills | Status: DC

## 2021-08-17 NOTE — Patient Instructions (Addendum)
Medication Instructions:  The current medical regimen is effective;  continue present plan and medications.  *If you need a refill on your cardiac medications before your next appointment, please call your pharmacy*  Follow-Up: At [facility], you and your health needs are our priority.  As part of our continuing mission to provide you with exceptional heart care, we have created designated Provider Care Teams.  These Care Teams include your primary Cardiologist (physician) and Advanced Practice Providers (APPs -  Physician Assistants and Nurse Practitioners) who all work together to provide you with the care you need, when you need it.  We recommend signing up for the patient portal called "MyChart".  Sign up information is provided on this After Visit Summary.  MyChart is used to connect with patients for Virtual Visits (Telemedicine).  Patients are able to view lab/test results, encounter notes, upcoming appointments, etc.  Non-urgent messages can be sent to your provider as well.   To learn more about what you can do with MyChart, go to [email or web address].    Your next appointment:   6 month(s)  The format for your next appointment:   In Person  Provider:   James Hochrein, MD   Thank you for choosing [facility]!!     

## 2021-08-18 ENCOUNTER — Ambulatory Visit: Payer: Medicare Other | Admitting: Family Medicine

## 2021-08-19 ENCOUNTER — Telehealth: Payer: Self-pay | Admitting: Family Medicine

## 2021-08-19 DIAGNOSIS — N3010 Interstitial cystitis (chronic) without hematuria: Secondary | ICD-10-CM

## 2021-08-19 DIAGNOSIS — I10 Essential (primary) hypertension: Secondary | ICD-10-CM

## 2021-08-19 DIAGNOSIS — F4323 Adjustment disorder with mixed anxiety and depressed mood: Secondary | ICD-10-CM

## 2021-08-19 MED ORDER — QUETIAPINE FUMARATE 50 MG PO TABS: 50.0000 mg | ORAL_TABLET | Freq: Every day | ORAL | 3 refills | Status: DC

## 2021-08-19 MED ORDER — FAMOTIDINE 20 MG PO TABS: 20.0000 mg | ORAL_TABLET | Freq: Two times a day (BID) | ORAL | 3 refills | Status: DC

## 2021-08-19 MED ORDER — LEVOTHYROXINE SODIUM 50 MCG PO TABS: 50.0000 ug | ORAL_TABLET | Freq: Every day | ORAL | 3 refills | Status: DC

## 2021-08-19 MED ORDER — BETHANECHOL CHLORIDE 10 MG PO TABS: 10.0000 mg | ORAL_TABLET | Freq: Three times a day (TID) | ORAL | 3 refills | Status: DC

## 2021-08-19 MED ORDER — HYDROCHLOROTHIAZIDE 25 MG PO TABS: 25.0000 mg | ORAL_TABLET | Freq: Every day | ORAL | 3 refills | Status: DC

## 2021-08-19 NOTE — Telephone Encounter (Signed)
She probably meant me not Dr. Darnell Level, she is my patient and I have prescribed some medicines for her, please get the correct [facility] pharmacy from her so that we can do mail order prescriptions

## 2021-08-24 ENCOUNTER — Telehealth: Payer: Self-pay | Admitting: Family Medicine

## 2021-08-24 NOTE — Telephone Encounter (Signed)
Pt informed. She does not want to make an appt at this time. She will call back if the light headedness does not go away.

## 2021-08-24 NOTE — Telephone Encounter (Addendum)
Pt is not sure why she is taking the Behtanechol. She is taking it twice per day/not three times per day.

## 2021-08-24 NOTE — Telephone Encounter (Signed)
We will have her stop the bethanechol because that supposed to help with urinary retention so we will make her urinate more frequently and if she wants to get her potassium checked then have her come in and make an appointment with me.

## 2021-08-30 ENCOUNTER — Telehealth: Payer: Self-pay

## 2021-08-30 NOTE — Telephone Encounter (Signed)
I called patient. I discussed her heart monitor study results. Patient will continue eliquis and keep her appointment on 09/20/2021. Pt verbalized understanding of results. Pt had no questions at this time but was encouraged to call back if questions arise.

## 2021-08-30 NOTE — Telephone Encounter (Signed)
-----   Message from Garvin Fila, MD sent at 08/30/2021  8:56 AM EDT ----- Mitchell Heir inform the patient that heart monitor study showed a few episodes of paroxysmal A. fib.  She is already on Eliquis for anticoagulation.  No changes at this time ----- Message ----- From: Minus Breeding, MD Sent: 08/21/2021  10:06 AM EDT To: Garvin Fila, MD

## 2021-09-01 ENCOUNTER — Ambulatory Visit: Payer: Medicare Other | Admitting: Family Medicine

## 2021-09-15 DIAGNOSIS — B351 Tinea unguium: Secondary | ICD-10-CM | POA: Diagnosis not present

## 2021-09-15 DIAGNOSIS — M79676 Pain in unspecified toe(s): Secondary | ICD-10-CM | POA: Diagnosis not present

## 2021-09-20 ENCOUNTER — Encounter: Payer: Self-pay | Admitting: Adult Health

## 2021-09-20 ENCOUNTER — Ambulatory Visit: Payer: Medicare Other | Admitting: Adult Health

## 2021-09-20 VITALS — BP 132/76 | HR 87 | Ht 64.0 in | Wt 135.8 lb

## 2021-09-20 DIAGNOSIS — I480 Paroxysmal atrial fibrillation: Secondary | ICD-10-CM | POA: Diagnosis not present

## 2021-09-20 DIAGNOSIS — I63231 Cerebral infarction due to unspecified occlusion or stenosis of right carotid arteries: Secondary | ICD-10-CM | POA: Diagnosis not present

## 2021-09-20 NOTE — Progress Notes (Signed)
[facility] [address] [place]. [place] [address] [phone]       OFFICE FOLLOW UP NOTE  [name] Date of Birth:  [birth year] Medical Record Number:  [id number]   Referring MD: Vonna Kotyk [name]  Primary neurologist: Dr. Leonie Man Reason for Referral: Stroke   Chief Complaint  Patient presents with   Stroke, Memory Eval    Rm 2  dgtrCoralyn Mark  MMSE 28       HPI:   Update 09/20/2021 JM: Returns for sooner requested visit.  She is requesting a memory evaluation in order to return to driving. She is accompanied by her daughter.  Shortly after her stroke, she was advised no driving by PCP Dr. Warrick Parisian due to memory concerns and confusion.  She recently completed [facility] after working with PT/OT/SLP for 3 month duration and reports making excellent recovery currently at baseline functioning.  She continues to live alone. She is able to maintain all ADLs and majority of IADLs except for driving. She has since returned back to managing her finances and bill paying as well as medications. Continues use of RW which is baseline for hx of polio with mild LLE weakness. Prior to stroke, only driving short distance. She would refrain from nighttime driving and avoid busier roads or highways.  MMSE today 29/30 (missed 1 recall word)  Compliant on Eliquis without side effects.  Blood pressure today 132/76.   Denies new stroke/TIA symptoms. No further concerns at this time.    History provided for reference purposes only Initial visit 07/18/2021 Dr. Leonie Man: [name] is a pleasant 87 year old Caucasian lady seen today for initial office consultation visit for stroke.  History is obtained from the patient and her daughter is accompanying her as well as review of electronic medical records in epic as well as care everywhere.  Pertinent imaging films were also reviewed.  She has past medical history of coronary artery disease, hypertension, hyperlipidemia,  osteopenia who presented on 03/27/2021 with being found by family on the floor near her recliner at home.  Her last known well was at 1930 hrs. on 03/26/2021.  She lives alone but is independent of most actives of daily living and does walk with a walker.  On EMS arrival she had right gaze deviation with left facial droop and left hemiparesis.  CT scan showed right temporal, insular and basal ganglia hypodensities and CT angiogram of the head and neck showed occlusion of the right internal carotid artery just beyond the bulb extending to the supraclinoid portion and CT perfusion scan showed a large penumbra.  Patient's family consented for mechanical thrombectomy but since there was another patient on the table at [facility] she had to be emergently  transferred to [facility] using interventional radiology transfer line after discussing the case with Dr. Arizona Constable who accepted the patient for emergent transfer and performed emergent thrombectomy resulting in TICI 2C final revascularization.  Review of records at [facility] in [place].  Initial NIH stroke scale on admission was 14.  MRI scan showed moderate sized acute infarct involving right basal ganglia, insula and right temporoparietal lobes with tiny acute infarct in the right cerebellum with petechial hemorrhage but no frank hematoma.  Transthoracic echo showed left ventricular ejection fraction of 60 to 65%.  Hemoglobin A1c is 5.3.  Lipid profile was 141.  Patient was transferred to inpatient rehab at [facility] skilled nursing facility for 2 and half weeks and subsequently recovered to go back  home and live independently.  Her NIH stroke scale was improved to 2 at discharge.  Patient is able to walk with a walker.  She states that most of her strength has improved but her stamina has not she gets tired more easily particularly when she is walking for a while and has to rest.  She is no longer able to do finances as it has been some  cognitive deterioration noted and the daughter has taken that over.  Patient was subsequently found to have paroxysmal A. fib on a 30-day heart monitor and now has been switched to Eliquis.  She does complain of some minor bruising but no bleeding.  Blood pressure is well controlled today it is 129/78.  He has not had any recurrent stroke or TIA symptoms.      ROS:   14 system review of systems is positive for those listed in HPI and all other systems negative  PMH:  Past Medical History:  Diagnosis Date   Adjustment disorder with mixed anxiety and depressed mood    Atrial fibrillation ([place]) 11/04/2015   Post-operative   Benign hypertensive heart disease without heart failure    CAD (coronary artery disease)    Infection of urinary tract 10/31/2015   Interstitial cystitis    Osteopenia    Other and unspecified hyperlipidemia    Other dyspnea and respiratory abnormality    Pelvic fracture ([facility]) 05/30/2015   Post-polio syndrome    polio age 12   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   LIMA to LAD   Stroke [facility])     Social History:  Social History   Socioeconomic History   Marital status: Widowed    Spouse name: Not on file   Number of children: 2   Years of education: Not on file   Highest education level: High school graduate  Occupational History   Occupation: retired    Fish farm manager: [employer]    Comment: admin. assistant  Tobacco Use   Smoking status: Never   Smokeless tobacco: Never  Vaping Use   Vaping Use: Never used  Substance and Sexual Activity   Alcohol use: No   Drug use: No   Sexual activity: Not on file  Other Topics Concern   Not on file  Social History Narrative   09/20/21 Lives with alone   Right Handed   Drinks no caffeine daily   Social Determinants of Health   Financial Resource Strain: Not on file  Food Insecurity: Not on file  Transportation Needs: Not on file  Physical Activity: Not on file  Stress: Not on file  Social Connections: Not on  file  Intimate Partner Violence: Not on file    Medications:   Current Outpatient Medications on File Prior to Visit  Medication Sig Dispense Refill   acetaminophen (TYLENOL) 325 MG tablet Take 325 mg by mouth every 6 (six) hours as needed.     amLODipine (NORVASC) 5 MG tablet Take 1 tablet (5 mg total) by mouth daily. 90 tablet 3   apixaban (ELIQUIS) 5 MG TABS tablet Take 1 tablet (5 mg total) by mouth 2 (two) times daily. 60 tablet 6   cholecalciferol (VITAMIN D3) 25 MCG (1000 UNIT) tablet Take 1,000 Units by mouth daily.     Cyanocobalamin (VITAMIN B-12 PO) Take 500 mcg by mouth every morning.     famotidine (PEPCID) 20 MG tablet Take 1 tablet (20 mg total) by mouth 2 (two) times daily. 180 tablet 3   hydrochlorothiazide (  HYDRODIURIL) 25 MG tablet Take 1 tablet (25 mg total) by mouth daily. 90 tablet 3   levothyroxine (SYNTHROID) 50 MCG tablet Take 1 tablet (50 mcg total) by mouth daily before breakfast. 90 tablet 3   Multiple Vitamins-Minerals (PRESERVISION AREDS 2 PO) Take 1 capsule by mouth at bedtime.     QUEtiapine (SEROQUEL) 50 MG tablet Take 1 tablet (50 mg total) by mouth at bedtime. 90 tablet 3   bethanechol (URECHOLINE) 10 MG tablet Take 1 tablet (10 mg total) by mouth 3 (three) times daily. (Patient not taking: Reported on 09/20/2021) 270 tablet 3   senna (SENOKOT) 8.6 MG TABS tablet Take 1 tablet by mouth. (Patient not taking: Reported on 09/20/2021)     tiZANidine (ZANAFLEX) 4 MG tablet Take 0.5 tablets (2 mg total) by mouth every 8 (eight) hours as needed for muscle spasms. (Patient not taking: No sig reported) 30 tablet 0   Current Facility-Administered Medications on File Prior to Visit  Medication Dose Route Frequency Provider Last Rate Last Admin   methylPREDNISolone acetate (DEPO-MEDROL) injection 80 mg  80 mg Intramuscular Once [name], [name], MD        Allergies:   Allergies  Allergen Reactions   Nitrofurantoin Other (See Comments)    Liver failure   Nsaids  Other (See Comments)    Kidney failure   Other Other (See Comments)    Most antibiotics require supervision per patient   Flonase [Fluticasone Propionate] Other (See Comments)    headache   Prevnar 13 [Pneumococcal 13-Val Conj Vacc] Other (See Comments)    Nerve damage in left arm   Beta Adrenergic Blockers    Nortriptyline Hcl Other (See Comments)    States she felt like worms where crawling under her skin.   Potassium-Containing Compounds     Rash and raw areas / sore breast    Alendronate Sodium Other (See Comments)   Ciprofloxacin Other (See Comments)    Patient denies allergy--"I take Cipro for bladder infections"   Codeine Other (See Comments)   Crestor [Rosuvastatin Calcium] Other (See Comments)   Darvon Other (See Comments)   Lipitor [Atorvastatin Calcium] Other (See Comments)   Pravachol Other (See Comments)   Propranolol Hcl Other (See Comments)    Unknown   Sulfa Antibiotics Other (See Comments)   Sulfamethoxazole Other (See Comments)    Unknown   Ultram [Tramadol Hcl] Itching   Welchol [Colesevelam Hcl] Other (See Comments)   Zetia [Ezetimibe] Other (See Comments)   Zocor [Simvastatin] Other (See Comments)    Physical Exam Today's Vitals   09/20/21 0919  BP: 132/76  Pulse: 87  Weight: 135 lb 12.8 oz (61.6 kg)  Height: 5\' 4"  (1.626 m)   Body mass index is 23.31 kg/m.   General: Very pleasant frail elderly Caucasian lady, seated, in no evident distress Head: head normocephalic and atraumatic.   Neck: supple with no carotid or supraclavicular bruits Cardiovascular: regular rate and rhythm, no murmurs Musculoskeletal: no deformity Skin:  no rash/petichiae Vascular:  Normal pulses all extremities  Neurologic Exam Mental Status: Awake and fully alert. Fluent speech and language.  Oriented to place and time. Recent and remote memory intact. Attention span, concentration and fund of knowledge appropriate. Mood and affect appropriate.  MMSE - Mini Mental State  Exam 09/20/2021 08/23/2018  Orientation to time 5 5  Orientation to Place 5 5  Registration 3 3  Attention/ Calculation 5 5  Recall 2 3  Language- name 2 objects 2 2  Language-  repeat 1 1  Language- follow 3 step command 3 3  Language- read & follow direction 1 1  Write a sentence 1 1  Copy design 1 1  Total score 29 30   Cranial Nerves: Pupils equal, briskly reactive to light. Extraocular movements full without nystagmus. Visual fields full to confrontation. HOH bilaterally. facial sensation intact. Face, tongue, palate moves normally and symmetrically.  Motor: Normal bulk and tone. Normal strength in all tested extremity muscles except mild LLE weakness chronic from polio  Sensory.: intact to touch , pinprick , position and vibratory sensation.  Coordination: Rapid alternating movements normal in all extremities. Finger-to-nose and heel-to-shin performed accurately bilaterally. Gait and Station: Arises from chair without difficulty. Stance is stooped.  Uses a walker.  Gait demonstrates normal stride length and balance with use of rolling walker.  Tandem walking not attempted.   Reflexes: 1+ and symmetric. Toes downgoing.       ASSESSMENT: 87 year old Caucasian lady with right MCA infarct secondary to right carotid occlusion likely from paroxysmal A. fib and May 2022 treated with emergent mechanical thrombectomy with TICI 2 C vascularization and excellent clinical outcome and complete stroke recovery.  Vascular risk factors of hypertension, hyperlipidemia, paroxysmal A. fib and age    PLAN:  -memory testing today satisfactory and currently at baseline per daughter and patient - I do believe she would do okay returning back to driving only short distance (as she did previously). Discussed graduated return to driving and daughter plans on riding with her for the first few times.  Discussed importance of monitoring for any difficulty with multitasking or reaction times.  Discussed  avoidance of nighttime driving and heavy traffic areas as well as highways. -Continue Eliquis (apixaban) 5 mg twice daily  for secondary stroke prevention -continue to follow closely with PCP and cardiology to maintain strict control of hypertension with blood pressure goal below 130/90 and lipids with LDL cholesterol goal below 70 mg/dL as well as  A fib on Eliquis.    Follow-up in 6 months or call earlier if needed   CC:  [name], [name], MD    I spent 34 minutes of face-to-face and non-face-to-face time with patient and daughter.  This included previsit chart review, lab review, study review, electronic health record documentation, patient and daughter education and discussion regarding return to driving with completion and review of MMSE, graduated return to driving instructions, continued f/u with PCP for aggressive stroke risk factor management and answered all other questions to patient and daughters satisfaction   Frann Rider, [facility]  [facility] [address] [place], [place] [address]  Phone [phone] Fax [phone] Note: This document was prepared with digital dictation and possible smart phrase technology. Any transcriptional errors that result from this process are unintentional.

## 2021-09-20 NOTE — Patient Instructions (Addendum)
Continue Eliquis (apixaban) daily for secondary stroke prevention  Continue to follow up with PCP regarding cholesterol and blood pressure management  Maintain strict control of hypertension with blood pressure goal below 130/90 and cholesterol with LDL cholesterol (bad cholesterol) goal below 70 mg/dL.   Graduated return to driving as recommended.  It is recommended that you first drive with another licensed driver in an empty parking lot. If you do well with this, you can drive on a quiet street with the licensed driver.  If you do well with this, you can drive on a busy street with a licensed driver.  If you continue to do well, you can be cleared to drive independently.  For the first month after resuming driving, it is recommend no nighttime, busy/heavy traffic roads or Interstate driving.  She is accompanied by her daughter.    Followup in the future with me in 6 months or call earlier if needed       Thank you for coming to see [name] at [facility]. I hope we have been able to provide you high quality care today.  You may receive a patient satisfaction survey over the next few weeks. We would appreciate your feedback and comments so that we may continue to improve ourselves and the health of our patients.

## 2021-09-21 ENCOUNTER — Telehealth: Payer: Self-pay | Admitting: Cardiology

## 2021-09-21 NOTE — Telephone Encounter (Signed)
Patient wants to know if it's okay to take Allergia while taking Eliquis.

## 2021-09-21 NOTE — Telephone Encounter (Signed)
Patient has been made aware and verbalized her understanding.  

## 2021-09-21 NOTE — Telephone Encounter (Signed)
Message routed to pharmd for recommendations.

## 2021-09-21 NOTE — Telephone Encounter (Signed)
Ok to take antihistamines, no issue with her Eliquis.

## 2021-09-29 ENCOUNTER — Ambulatory Visit (INDEPENDENT_AMBULATORY_CARE_PROVIDER_SITE_OTHER): Payer: Medicare Other | Admitting: Family Medicine

## 2021-09-29 ENCOUNTER — Other Ambulatory Visit: Payer: Self-pay

## 2021-09-29 ENCOUNTER — Encounter: Payer: Self-pay | Admitting: Family Medicine

## 2021-09-29 VITALS — BP 133/86 | HR 80 | Wt 138.0 lb

## 2021-09-29 DIAGNOSIS — I10 Essential (primary) hypertension: Secondary | ICD-10-CM | POA: Diagnosis not present

## 2021-09-29 DIAGNOSIS — E7800 Pure hypercholesterolemia, unspecified: Secondary | ICD-10-CM

## 2021-09-29 DIAGNOSIS — F4323 Adjustment disorder with mixed anxiety and depressed mood: Secondary | ICD-10-CM | POA: Diagnosis not present

## 2021-09-29 NOTE — Progress Notes (Signed)
BP 133/86   Pulse 80   Wt 138 lb (62.6 kg)   LMP 03/21/1971   SpO2 98%   BMI 23.69 kg/m    Subjective:   Patient ID: Emily Wagner, female    DOB: [birth year], 87 y.o.   MRN: [id number]  HPI: Emily Wagner is a 87 y.o. female presenting on 09/29/2021 for Medical Management of Chronic Issues, Hypertension, and Hyperlipidemia   HPI Hypertension Patient is currently on amlodipine and hydrochlorothiazide, and their blood pressure today is 133/86. Patient denies any lightheadedness or dizziness. Patient denies headaches, blurred vision, chest pains, shortness of breath, or weakness. Denies any side effects from medication and is content with current medication.   Hyperlipidemia Patient is coming in for recheck of his hyperlipidemia. The patient is currently taking no medication currently, diet control. They deny any issues with myalgias or history of liver damage from it. They deny any focal numbness or weakness or chest pain.   Anxiety depression recheck Seroquel and seems to be doing well for her.  She denies any major side effects.  She is sleeping well with it.  Denies any suicidal ideations.  Patient did see neurology and they did clear her and say that she is safe to drive for short distances to watch out for any major traffic as her reaction speed may be slightly slower.  Patient has postpolio syndrome and needs paperwork for assistance through the [place] because of her chronic pains and weakness in her legs.  Relevant past medical, surgical, family and social history reviewed and updated as indicated. Interim medical history since our last visit reviewed. Allergies and medications reviewed and updated.  Review of Systems  Constitutional:  Negative for chills and fever.  Eyes:  Negative for visual disturbance.  Respiratory:  Negative for chest tightness and shortness of breath.   Cardiovascular:  Negative for chest pain and leg swelling.  Musculoskeletal:  Positive for  arthralgias, back pain and gait problem.  Skin:  Negative for rash.  Neurological:  Negative for light-headedness and headaches.  Psychiatric/Behavioral:  Negative for agitation and behavioral problems.   All other systems reviewed and are negative.  Per HPI unless specifically indicated above   Allergies as of 09/29/2021       Reactions   Nitrofurantoin Other (See Comments)   Liver failure   Nsaids Other (See Comments)   Kidney failure   Other Other (See Comments)   Most antibiotics require supervision per patient   Flonase [fluticasone Propionate] Other (See Comments)   headache   Prevnar 13 [pneumococcal 13-val Conj Vacc] Other (See Comments)   Nerve damage in left arm   Beta Adrenergic Blockers    Nortriptyline Hcl Other (See Comments)   States she felt like worms where crawling under her skin.   Potassium-containing Compounds    Rash and raw areas / sore breast    Alendronate Sodium Other (See Comments)   Ciprofloxacin Other (See Comments)   Patient denies allergy--"I take Cipro for bladder infections"   Codeine Other (See Comments)   Crestor [rosuvastatin Calcium] Other (See Comments)   Darvon Other (See Comments)   Lipitor [atorvastatin Calcium] Other (See Comments)   Pravachol Other (See Comments)   Propranolol Hcl Other (See Comments)   Unknown   Sulfa Antibiotics Other (See Comments)   Sulfamethoxazole Other (See Comments)   Unknown   Ultram [tramadol Hcl] Itching   Welchol [colesevelam Hcl] Other (See Comments)   Zetia [ezetimibe] Other (See Comments)  Zocor [simvastatin] Other (See Comments)        Medication List        Accurate as of September 29, 2021 10:54 AM. If you have any questions, ask your nurse or doctor.          STOP taking these medications    tiZANidine 4 MG tablet Commonly known as: Zanaflex Stopped by: [name] Darry Kelnhofer, MD       TAKE these medications    acetaminophen 325 MG tablet Commonly known as: TYLENOL Take 325  mg by mouth every 6 (six) hours as needed.   amLODipine 5 MG tablet Commonly known as: NORVASC Take 1 tablet (5 mg total) by mouth daily.   apixaban 5 MG Tabs tablet Commonly known as: ELIQUIS Take 1 tablet (5 mg total) by mouth 2 (two) times daily.   bethanechol 10 MG tablet Commonly known as: URECHOLINE Take 1 tablet (10 mg total) by mouth 3 (three) times daily.   cholecalciferol 25 MCG (1000 UNIT) tablet Commonly known as: VITAMIN D3 Take 1,000 Units by mouth daily.   famotidine 20 MG tablet Commonly known as: PEPCID Take 1 tablet (20 mg total) by mouth 2 (two) times daily.   hydrochlorothiazide 25 MG tablet Commonly known as: HYDRODIURIL Take 1 tablet (25 mg total) by mouth daily.   levothyroxine 50 MCG tablet Commonly known as: SYNTHROID Take 1 tablet (50 mcg total) by mouth daily before breakfast.   PRESERVISION AREDS 2 PO Take 1 capsule by mouth at bedtime.   QUEtiapine 50 MG tablet Commonly known as: SEROquel Take 1 tablet (50 mg total) by mouth at bedtime.   senna 8.6 MG Tabs tablet Commonly known as: SENOKOT Take 1 tablet by mouth.   VITAMIN B-12 PO Take 500 mcg by mouth every morning.         Objective:   BP 133/86   Pulse 80   Wt 138 lb (62.6 kg)   LMP 03/21/1971   SpO2 98%   BMI 23.69 kg/m   Wt Readings from Last 3 Encounters:  09/29/21 138 lb (62.6 kg)  09/20/21 135 lb 12.8 oz (61.6 kg)  08/17/21 135 lb (61.2 kg)    Physical Exam Vitals and nursing note reviewed.  Constitutional:      General: She is not in acute distress.    Appearance: She is well-developed. She is not diaphoretic.  Eyes:     Conjunctiva/sclera: Conjunctivae normal.  Cardiovascular:     Rate and Rhythm: Normal rate and regular rhythm.     Heart sounds: Normal heart sounds. No murmur heard. Pulmonary:     Effort: Pulmonary effort is normal. No respiratory distress.     Breath sounds: Normal breath sounds. No wheezing.  Skin:    General: Skin is warm and dry.      Findings: No rash.  Neurological:     Mental Status: She is alert and oriented to person, place, and time.     Coordination: Coordination normal.  Psychiatric:        Behavior: Behavior normal.      Assessment & Plan:   Problem List Items Addressed This Visit       Cardiovascular and Mediastinum   Essential hypertension     Other   HYPERCHOLESTEROLEMIA - Primary   Adjustment disorder with mixed anxiety and depressed mood   Relevant Orders   TSH   CMP14+EGFR    Will check blood work, patient seems to be doing well.  No change in medication at  this point. Follow up plan: Return in about 4 months (around 01/27/2022), or if symptoms worsen or fail to improve, for Hypertension and thyroid and anxiety.  Counseling provided for all of the vaccine components Orders Placed This Encounter  Procedures   TSH   CMP14+EGFR     Caryl Pina, MD [facility] 09/29/2021, 10:54 AM

## 2021-09-30 ENCOUNTER — Other Ambulatory Visit: Payer: Medicare Other

## 2021-09-30 DIAGNOSIS — R6889 Other general symptoms and signs: Secondary | ICD-10-CM | POA: Diagnosis not present

## 2021-10-01 LAB — CMP14+EGFR
ALT: 9 IU/L (ref 0–32)
AST: 20 IU/L (ref 0–40)
Albumin/Globulin Ratio: 1.5 (ref 1.2–2.2)
Albumin: 4.1 g/dL (ref 3.6–4.6)
Alkaline Phosphatase: 88 IU/L (ref 44–121)
BUN/Creatinine Ratio: 14 (ref 12–28)
BUN: 15 mg/dL (ref 8–27)
Bilirubin Total: 0.5 mg/dL (ref 0.0–1.2)
CO2: 23 mmol/L (ref 20–29)
Calcium: 9.4 mg/dL (ref 8.7–10.3)
Chloride: 103 mmol/L (ref 96–106)
Creatinine, Ser: 1.05 mg/dL — ABNORMAL HIGH (ref 0.57–1.00)
Globulin, Total: 2.7 g/dL (ref 1.5–4.5)
Glucose: 93 mg/dL (ref 70–99)
Potassium: 3.6 mmol/L (ref 3.5–5.2)
Sodium: 141 mmol/L (ref 134–144)
Total Protein: 6.8 g/dL (ref 6.0–8.5)
eGFR: 52 mL/min/{1.73_m2} — ABNORMAL LOW (ref >59)

## 2021-10-01 LAB — TSH: TSH: 2.78 u[IU]/mL (ref 0.450–4.500)

## 2021-10-06 ENCOUNTER — Telehealth: Payer: Self-pay | Admitting: Family Medicine

## 2021-10-06 NOTE — Telephone Encounter (Signed)
Left message to call back  

## 2021-10-12 ENCOUNTER — Telehealth: Payer: Self-pay | Admitting: *Deleted

## 2021-10-12 NOTE — Telephone Encounter (Signed)
Paperwork completed by Dr Percival Spanish.  To be faxed to [employer] pt assistance program.

## 2021-10-12 NOTE — Telephone Encounter (Signed)
Pt walked into [place] office with [employer] pt assistance forms completed. Includes financial information and out of pocket expenses.  Given to Dr Percival Spanish to sign.

## 2021-10-18 NOTE — Telephone Encounter (Signed)
All complete paperwork scanned and e-mail to [facility], LPN for processing and follow up.

## 2021-10-24 NOTE — Telephone Encounter (Signed)
**Note De-Identified  Obfuscation** I have completed the provider page of the pts [employer] application for Eliquis and have e-mailed all to Dr Hochrein's nurse so she can fax as is to [facility] at the fax number written on the cover letter included or to place in the to be faxed basket in Medical Records to be faxed.

## 2021-10-24 NOTE — Telephone Encounter (Signed)
Email received, printed and faxed to # on cover sheet as requested.

## 2021-10-25 ENCOUNTER — Ambulatory Visit: Payer: Medicare Other | Admitting: Adult Health

## 2021-11-02 ENCOUNTER — Encounter: Payer: Self-pay | Admitting: Cardiology

## 2021-11-02 NOTE — Telephone Encounter (Signed)
Patient called back because she has not heard from the Nurse yet. She would like someone to call her today

## 2021-11-02 NOTE — Telephone Encounter (Signed)
error 

## 2021-11-02 NOTE — Telephone Encounter (Signed)
Patient calling to follow up on bristol myers application.

## 2021-11-02 NOTE — Telephone Encounter (Signed)
Routed to primary nurse for assistance

## 2021-11-03 NOTE — Telephone Encounter (Signed)
**Note De-Identified  Obfuscation** I s/w Emily Wagner at [facility] who advised me that the pt was approved for asst on 07/29/21 until 11/26/21.  She states that they did receive the pts new application for 1884 but that they will not start processing for the year 2023 until after 11/10/21.  I called the pt and made her aware of this. She thanked me for calling [employer] for her and she is aware to call us back if she has any questions or concerns.

## 2021-11-14 ENCOUNTER — Other Ambulatory Visit: Payer: Self-pay

## 2021-11-14 ENCOUNTER — Telehealth: Payer: Self-pay | Admitting: Family Medicine

## 2021-11-14 DIAGNOSIS — Z0289 Encounter for other administrative examinations: Secondary | ICD-10-CM

## 2021-11-14 MED ORDER — QUETIAPINE FUMARATE 100 MG PO TABS
100.0000 mg | ORAL_TABLET | Freq: Every day | ORAL | 0 refills | Status: DC
Start: 2021-11-14 — End: 2021-11-22

## 2021-11-14 MED ORDER — QUETIAPINE FUMARATE 100 MG PO TABS: 100.0000 mg | ORAL_TABLET | Freq: Every day | ORAL | 0 refills | Status: DC

## 2021-11-14 NOTE — Telephone Encounter (Signed)
Have her increase the Seroquel and take 2 in the evening

## 2021-11-14 NOTE — Telephone Encounter (Signed)
Pt informed and understood. Appt kept in January just in case she needs.  Pt will take 2 tabs nightly for now.  New Rx sent for 100mg  nightly to [facility]- 90 day supply

## 2021-11-14 NOTE — Telephone Encounter (Signed)
Returning nurse call.

## 2021-11-14 NOTE — Telephone Encounter (Signed)
Pt states that she is not sleeping. She tosses and turns. Wakes up in pain.  She is currently take Seroquel but it does not help with her sleep. Pt had a prescription for Lorazepam and she took one and states that she slept like a baby.  Pt would like for a new prescription to be called in. Informed pt that she would need to be seen in the office because it is a controlled substance.  Pt is scheduled for Jan 12th. Will put on cancellation list. Will send to [name] as an FYI.

## 2021-11-14 NOTE — Addendum Note (Signed)
Addended by: Alphonzo Dublin on: 11/14/2021 04:28 PM   Modules accepted: Orders

## 2021-11-14 NOTE — Telephone Encounter (Signed)
lmtcb

## 2021-11-22 ENCOUNTER — Telehealth: Payer: Self-pay | Admitting: Family Medicine

## 2021-11-22 MED ORDER — QUETIAPINE FUMARATE 100 MG PO TABS
200.0000 mg | ORAL_TABLET | Freq: Every day | ORAL | 0 refills | Status: DC
Start: 2021-11-22 — End: 2021-12-08

## 2021-11-22 NOTE — Telephone Encounter (Signed)
°  Prescription Request  11/22/2021  Is this a "Controlled Substance" medicine? no  Have you seen your PCP in the last 2 weeks? no  If YES, route message to pool  -  If NO, patient needs to be scheduled for appointment.  What is the name of the medication or equipment? QUEtiapine (SEROQUEL) 100 MG tablet  Have you contacted your pharmacy to request a refill? Yes, [name] raised dose to 2 a day and the pharmacy still had prescription for 1 a day    Which pharmacy would you like this sent to? [facility] ([place], [place]   Patient notified that their request is being sent to the clinical staff for review and that they should receive a response within 2 business days.

## 2021-11-22 NOTE — Telephone Encounter (Signed)
Per TC on 11/14/21 pt as instructed to take 2 QHS, QTY was increased to #180 but sig was not changed to 200 mg, for 2 QHS Updated Rx and resent to [facility], pt will finish the bottle she has by taking 2 tabs QHS till her new Rx arrives with the new directions.

## 2021-11-29 ENCOUNTER — Ambulatory Visit (INDEPENDENT_AMBULATORY_CARE_PROVIDER_SITE_OTHER): Payer: Medicare Other | Admitting: Family Medicine

## 2021-11-29 ENCOUNTER — Encounter: Payer: Self-pay | Admitting: Family Medicine

## 2021-11-29 DIAGNOSIS — R42 Dizziness and giddiness: Secondary | ICD-10-CM

## 2021-11-29 MED ORDER — MECLIZINE HCL 12.5 MG PO TABS: 12.5000 mg | ORAL_TABLET | Freq: Three times a day (TID) | ORAL | 0 refills | Status: DC | PRN

## 2021-11-29 NOTE — Progress Notes (Signed)
Virtual Visit via Telephone Note  I connected with Emily Wagner on 11/29/21 at 1:18 PM by telephone and verified that I am speaking with the correct person using two identifiers. Emily Wagner is currently located at home and nobody is currently with her during this visit. The provider, Loman Brooklyn, FNP is located in their office at time of visit.  I discussed the limitations, risks, security and privacy concerns of performing an evaluation and management service by telephone and the availability of in person appointments. I also discussed with the patient that there may be a patient responsible charge related to this service. The patient expressed understanding and agreed to proceed.  Subjective: PCP: [name], Emily Kaufmann, MD  Chief Complaint  Patient presents with   [facility] ear   Patient complains of feeling dizzy and walking sideways. States she has had these episodes all her life. This one started yesterday and her prescription for meclizine has expired. States if she takes a meclizine tonight, she knows her symptoms will go away by tomorrow morning. She does have a cane and walker that she utilizes at home.    ROS: Per HPI  Current Outpatient Medications:    acetaminophen (TYLENOL) 325 MG tablet, Take 325 mg by mouth every 6 (six) hours as needed., Disp: , Rfl:    amLODipine (NORVASC) 5 MG tablet, Take 1 tablet (5 mg total) by mouth daily., Disp: 90 tablet, Rfl: 3   apixaban (ELIQUIS) 5 MG TABS tablet, Take 1 tablet (5 mg total) by mouth 2 (two) times daily., Disp: 60 tablet, Rfl: 6   bethanechol (URECHOLINE) 10 MG tablet, Take 1 tablet (10 mg total) by mouth 3 (three) times daily., Disp: 270 tablet, Rfl: 3   cholecalciferol (VITAMIN D3) 25 MCG (1000 UNIT) tablet, Take 1,000 Units by mouth daily., Disp: , Rfl:    Cyanocobalamin (VITAMIN B-12 PO), Take 500 mcg by mouth every morning., Disp: , Rfl:    famotidine (PEPCID) 20 MG tablet, Take 1 tablet (20 mg total) by mouth 2  (two) times daily., Disp: 180 tablet, Rfl: 3   hydrochlorothiazide (HYDRODIURIL) 25 MG tablet, Take 1 tablet (25 mg total) by mouth daily., Disp: 90 tablet, Rfl: 3   levothyroxine (SYNTHROID) 50 MCG tablet, Take 1 tablet (50 mcg total) by mouth daily before breakfast., Disp: 90 tablet, Rfl: 3   Multiple Vitamins-Minerals (PRESERVISION AREDS 2 PO), Take 1 capsule by mouth at bedtime., Disp: , Rfl:    QUEtiapine (SEROQUEL) 100 MG tablet, Take 2 tablets (200 mg total) by mouth at bedtime., Disp: 180 tablet, Rfl: 0   senna (SENOKOT) 8.6 MG TABS tablet, Take 1 tablet by mouth., Disp: , Rfl:   Current Facility-Administered Medications:    methylPREDNISolone acetate (DEPO-MEDROL) injection 80 mg, 80 mg, Intramuscular, Once, [name], Emily Kaufmann, MD  Allergies  Allergen Reactions   Nitrofurantoin Other (See Comments)    Liver failure   Nsaids Other (See Comments)    Kidney failure   Other Other (See Comments)    Most antibiotics require supervision per patient   Flonase [Fluticasone Propionate] Other (See Comments)    headache   Prevnar 13 [Pneumococcal 13-Val Conj Vacc] Other (See Comments)    Nerve damage in left arm   Beta Adrenergic Blockers    Nortriptyline Hcl Other (See Comments)    States she felt like worms where crawling under her skin.   Potassium-Containing Compounds     Rash and raw areas / sore breast  Alendronate Sodium Other (See Comments)   Ciprofloxacin Other (See Comments)    Patient denies allergy--"I take Cipro for bladder infections"   Codeine Other (See Comments)   Crestor [Rosuvastatin Calcium] Other (See Comments)   Darvon Other (See Comments)   Lipitor [Atorvastatin Calcium] Other (See Comments)   Pravachol Other (See Comments)   Propranolol Hcl Other (See Comments)    Unknown   Sulfa Antibiotics Other (See Comments)   Sulfamethoxazole Other (See Comments)    Unknown   Ultram [Tramadol Hcl] Itching   Welchol [Colesevelam Hcl] Other (See Comments)   Zetia  [Ezetimibe] Other (See Comments)   Zocor [Simvastatin] Other (See Comments)   Past Medical History:  Diagnosis Date   Adjustment disorder with mixed anxiety and depressed mood    Atrial fibrillation ([place]) 11/04/2015   Post-operative   Benign hypertensive heart disease without heart failure    CAD (coronary artery disease)    Infection of urinary tract 10/31/2015   Interstitial cystitis    Osteopenia    Other and unspecified hyperlipidemia    Other dyspnea and respiratory abnormality    Pelvic fracture ([place]) 05/30/2015   Post-polio syndrome    polio age 21   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 Vertigo Advised to let us know if symptoms do not resolve as she expects them to by tomorrow.  - meclizine (ANTIVERT) 12.5 MG tablet; Take 1 tablet (12.5 mg total) by mouth 3 (three) times daily as needed for dizziness.  Dispense: 30 tablet; Refill: 0   Follow Up Instructions:  I discussed the assessment and treatment plan with the patient. The patient was provided an opportunity to ask questions and all were answered. The patient agreed with the plan and demonstrated an understanding of the instructions.   The patient was advised to call back or seek an in-person evaluation if the symptoms worsen or if the condition fails to improve as anticipated.  The above assessment and management plan was discussed with the patient. The patient verbalized understanding of and has agreed to the management plan. Patient is aware to call the clinic if symptoms persist or worsen. Patient is aware when to return to the clinic for a follow-up visit. Patient educated on when it is appropriate to go to the emergency department.   Time call ended: 1:29 PM  I provided 11 minutes of non-face-to-face time during this  encounter.  Hendricks Limes, MSN, APRN, FNP-C [facility] 11/29/21

## 2021-12-06 DIAGNOSIS — M79676 Pain in unspecified toe(s): Secondary | ICD-10-CM | POA: Diagnosis not present

## 2021-12-06 DIAGNOSIS — B351 Tinea unguium: Secondary | ICD-10-CM | POA: Diagnosis not present

## 2021-12-08 ENCOUNTER — Encounter: Payer: Self-pay | Admitting: Family Medicine

## 2021-12-08 ENCOUNTER — Ambulatory Visit (INDEPENDENT_AMBULATORY_CARE_PROVIDER_SITE_OTHER): Payer: Medicare Other | Admitting: Family Medicine

## 2021-12-08 VITALS — BP 135/90 | HR 91 | Ht 64.0 in | Wt 137.0 lb

## 2021-12-08 DIAGNOSIS — F4323 Adjustment disorder with mixed anxiety and depressed mood: Secondary | ICD-10-CM

## 2021-12-08 DIAGNOSIS — Z23 Encounter for immunization: Secondary | ICD-10-CM

## 2021-12-08 MED ORDER — QUETIAPINE FUMARATE 50 MG PO TABS: 50.0000 mg | ORAL_TABLET | Freq: Every day | ORAL | 3 refills | Status: DC

## 2021-12-08 NOTE — Progress Notes (Signed)
BP 135/90    Pulse 91    Ht 5\' 4"  (1.626 m)    Wt 137 lb (62.1 kg)    LMP 03/21/1971    SpO2 96%    BMI 23.52 kg/m    Subjective:   Patient ID: Emily Wagner, female    DOB: 10-26-1933, 88 y.o.   MRN: [id number]  HPI: Emily Wagner is a 88 y.o. female presenting on 12/08/2021 for Medical Management of Chronic Issues and Insomnia (Pt is alright to continue Seroquel. She is not requesting Lorazepam. Believes though that 2 Seroquel are too strong. States that 100mg  was called in and it is supposed to be 50mg  nightly)   HPI Anxiety and insomnia and mood disorder recheck. Patient is coming in today for anxiety and insomnia and mood disorder recheck.  She is currently taking Seroquel and she said it was not doing as well and we increased to 200 mg and she said it was too much and made her sleep until 10 AM the next day and to reduce it back to 50 mg and she feels like it is doing very well for her.  She says she does miss the lorazepam still little bit but she feels like this is helping a lot.  She is much more Active and moving around and looks more content and happy today.  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 Interim medical history since our last visit reviewed. Allergies and medications reviewed and updated.  Review of Systems  Constitutional:  Negative for chills and fever.  HENT:  Negative for congestion, ear discharge and ear pain.   Eyes:  Negative for redness and visual disturbance.  Respiratory:  Negative for chest tightness and shortness of breath.   Cardiovascular:  Negative for chest pain and leg swelling.  Genitourinary:  Negative for difficulty urinating and dysuria.  Musculoskeletal:  Positive for arthralgias and myalgias. Negative for back pain and gait problem.  Skin:  Negative for rash.   Neurological:  Negative for light-headedness and headaches.  Psychiatric/Behavioral:  Positive for dysphoric mood. Negative for agitation, behavioral problems and sleep disturbance. The patient is nervous/anxious.    Per HPI unless specifically indicated above   Allergies as of 12/08/2021       Reactions   Nitrofurantoin Other (See Comments)   Liver failure   Nsaids Other (See Comments)   Kidney failure   Other Other (See Comments)   Most antibiotics require supervision per patient   Flonase [fluticasone Propionate] Other (See Comments)   headache   Prevnar 13 [pneumococcal 13-val Conj Vacc] Other (See Comments)   Nerve damage in left arm   Beta Adrenergic Blockers    Nortriptyline Hcl Other (See Comments)   States she felt like worms where crawling under her skin.   Potassium-containing Compounds    Rash and raw areas / sore breast    Alendronate Sodium Other (See Comments)   Ciprofloxacin Other (See Comments)   Patient denies allergy--"I take Cipro for bladder infections"   Codeine Other (See Comments)   Crestor [rosuvastatin Calcium] Other (See Comments)   Darvon Other (See Comments)   Lipitor [atorvastatin Calcium] Other (See Comments)   Pravachol Other (See Comments)   Propranolol Hcl Other (See Comments)   Unknown   Sulfa Antibiotics Other (See Comments)   Sulfamethoxazole  Other (See Comments)   Unknown   Ultram [tramadol Hcl] Itching   Welchol [colesevelam Hcl] Other (See Comments)   Zetia [ezetimibe] Other (See Comments)   Zocor [simvastatin] Other (See Comments)        Medication List        Accurate as of December 08, 2021  4:34 PM. If you have any questions, ask your nurse or doctor.          acetaminophen 325 MG tablet Commonly known as: TYLENOL Take 325 mg by mouth every 6 (six) hours as needed.   amLODipine 5 MG tablet Commonly known as: NORVASC Take 1 tablet (5 mg total) by mouth daily.   apixaban 5 MG Tabs tablet Commonly known as:  ELIQUIS Take 1 tablet (5 mg total) by mouth 2 (two) times daily.   bethanechol 10 MG tablet Commonly known as: URECHOLINE Take 1 tablet (10 mg total) by mouth 3 (three) times daily.   cholecalciferol 25 MCG (1000 UNIT) tablet Commonly known as: VITAMIN D3 Take 1,000 Units by mouth daily.   famotidine 20 MG tablet Commonly known as: PEPCID Take 1 tablet (20 mg total) by mouth 2 (two) times daily.   hydrochlorothiazide 25 MG tablet Commonly known as: HYDRODIURIL Take 1 tablet (25 mg total) by mouth daily.   levothyroxine 50 MCG tablet Commonly known as: SYNTHROID Take 1 tablet (50 mcg total) by mouth daily before breakfast.   meclizine 12.5 MG tablet Commonly known as: ANTIVERT Take 1 tablet (12.5 mg total) by mouth 3 (three) times daily as needed for dizziness.   PRESERVISION AREDS 2 PO Take 1 capsule by mouth at bedtime.   QUEtiapine 50 MG tablet Commonly known as: SEROQUEL Take 1 tablet (50 mg total) by mouth at bedtime. What changed:  medication strength how much to take Changed by: [name] Annalee Meyerhoff, MD   senna 8.6 MG Tabs tablet Commonly known as: SENOKOT Take 1 tablet by mouth.   VITAMIN B-12 PO Take 500 mcg by mouth every morning.         Objective:   BP 135/90    Pulse 91    Ht 5\' 4"  (1.626 m)    Wt 137 lb (62.1 kg)    LMP 03/21/1971    SpO2 96%    BMI 23.52 kg/m   Wt Readings from Last 3 Encounters:  12/08/21 137 lb (62.1 kg)  09/29/21 138 lb (62.6 kg)  09/20/21 135 lb 12.8 oz (61.6 kg)    Physical Exam Vitals and nursing note reviewed.  Constitutional:      General: She is not in acute distress.    Appearance: She is well-developed. She is not diaphoretic.  Eyes:     Conjunctiva/sclera: Conjunctivae normal.  Cardiovascular:     Rate and Rhythm: Normal rate and regular rhythm.     Heart sounds: Normal heart sounds. No murmur heard. Pulmonary:     Effort: Pulmonary effort is normal. No respiratory distress.     Breath sounds: Normal  breath sounds. No wheezing.  Skin:    General: Skin is warm and dry.     Findings: No rash.  Neurological:     Mental Status: She is alert and oriented to person, place, and time.     Coordination: Coordination normal.  Psychiatric:        Mood and Affect: Mood is anxious and depressed.        Behavior: Behavior normal.        Thought Content: Thought content does  not include suicidal ideation. Thought content does not include suicidal plan.      Assessment & Plan:   Problem List Items Addressed This Visit       Other   Adjustment disorder with mixed anxiety and depressed mood - Primary   Relevant Medications   QUEtiapine (SEROQUEL) 50 MG tablet   Other Visit Diagnoses     Need for shingles vaccine       Relevant Orders   Varicella-zoster vaccine IM (Shingrix) (Completed)       Continue Seroquel 50 mg. Follow up plan: Return in about 3 months (around 03/08/2022), or if symptoms worsen or fail to improve, for Anxiety and chronic medication recheck..  Counseling provided for all of the vaccine components Orders Placed This Encounter  Procedures   Varicella-zoster vaccine IM (Shingrix)    Caryl Pina, MD [facility] 12/08/2021, 4:34 PM

## 2021-12-12 ENCOUNTER — Telehealth: Payer: Self-pay | Admitting: Family Medicine

## 2021-12-12 DIAGNOSIS — F4323 Adjustment disorder with mixed anxiety and depressed mood: Secondary | ICD-10-CM

## 2021-12-12 MED ORDER — QUETIAPINE FUMARATE 50 MG PO TABS: 50.0000 mg | ORAL_TABLET | Freq: Every day | ORAL | 3 refills | Status: DC

## 2021-12-12 NOTE — Telephone Encounter (Signed)
Patient aware rx sent to pharmacy.  

## 2021-12-19 ENCOUNTER — Telehealth: Payer: Self-pay | Admitting: Family Medicine

## 2021-12-19 NOTE — Telephone Encounter (Signed)
[facility] left pt a message for about dr office not answering and she will not be able to get shipment of medication. I asked for the rx name and pt did not know. The message had an order number [id number]. Please call back

## 2021-12-19 NOTE — Telephone Encounter (Signed)
Patient di not going to take Quetiapine 50 mg because she is taking Amlodipine 5 mg and Hydrochlorothiazide 25 mg. Don't understand why [name] put her on that medicine when her other medication is working and her B/p is good. Last night it was 115/66 HR 70

## 2021-12-19 NOTE — Telephone Encounter (Signed)
Patient will call them back. Informed pt that we have confirmation that Seroquel 50mg  was sent to [facility] on 1/16.

## 2021-12-19 NOTE — Telephone Encounter (Signed)
Informed pt that the quetiapine is for her mood not for BP. Informed pt that [name] started her on this month ago when pt was weaning off of Ativan. Pt will continue to take Quetipine but still seems confused about why.

## 2021-12-20 DIAGNOSIS — Z961 Presence of intraocular lens: Secondary | ICD-10-CM | POA: Diagnosis not present

## 2021-12-20 DIAGNOSIS — H353131 Nonexudative age-related macular degeneration, bilateral, early dry stage: Secondary | ICD-10-CM | POA: Diagnosis not present

## 2021-12-20 DIAGNOSIS — H0102B Squamous blepharitis left eye, upper and lower eyelids: Secondary | ICD-10-CM | POA: Diagnosis not present

## 2021-12-20 DIAGNOSIS — H0102A Squamous blepharitis right eye, upper and lower eyelids: Secondary | ICD-10-CM | POA: Diagnosis not present

## 2021-12-20 DIAGNOSIS — I639 Cerebral infarction, unspecified: Secondary | ICD-10-CM | POA: Diagnosis not present

## 2021-12-20 DIAGNOSIS — H10413 Chronic giant papillary conjunctivitis, bilateral: Secondary | ICD-10-CM | POA: Diagnosis not present

## 2021-12-20 DIAGNOSIS — H04123 Dry eye syndrome of bilateral lacrimal glands: Secondary | ICD-10-CM | POA: Diagnosis not present

## 2021-12-21 ENCOUNTER — Telehealth: Payer: Self-pay | Admitting: Cardiology

## 2021-12-21 NOTE — Telephone Encounter (Signed)
Spoke with pt, aware will check for the forms when I return to the [place] office on friday

## 2021-12-21 NOTE — Telephone Encounter (Signed)
Pt reaching out to see if you all have heard from [facility] in regards to her financial assistance request.. please advise

## 2021-12-21 NOTE — Telephone Encounter (Signed)
Returned call to patient who states that she had sent in her portion of the [employer] forms and has not heard back yet. Patient would like to know if the physician portion of the form has been sent in yet or not. Advised patient I would check with Dr. Rosezella [place] nurse. Patient verbalized understanding.

## 2021-12-27 NOTE — Telephone Encounter (Signed)
**Note De-Identified  Obfuscation** I called [employer] and s/w Shada who states that the pts application is still pending.  She verified that they received it on 10/24/2021 and that it does not appear that anything is missing from her application.  Eldridge Scot states that she is pushing the pts application through to their processing dept and they should be processing it soon.   I have made the pt aware of this and she was very appreciative of my call with an update. She states that she has enough Eliquis to last until March so there is no rush at this time. She thanked me for calling her.

## 2021-12-27 NOTE — Telephone Encounter (Signed)
**Note De-Identified  Obfuscation** I called [employer] and s/w Shada who states that the pts application is still pending.  She verified that they received it on 10/24/2021 and that it does not appear that anything is missing from her application.  Eldridge Scot states that she is pushing the pts application through to their processing dept and they should be processing it soon.    I have made the pt aware of this and she was very appreciative of my call with an update. She states that she has enough Eliquis to last until March so there is no rush at this time. She thanked me for calling her.

## 2022-01-04 ENCOUNTER — Ambulatory Visit (INDEPENDENT_AMBULATORY_CARE_PROVIDER_SITE_OTHER): Payer: Medicare Other

## 2022-01-04 VITALS — Wt 137.0 lb

## 2022-01-04 DIAGNOSIS — Z0001 Encounter for general adult medical examination with abnormal findings: Secondary | ICD-10-CM | POA: Diagnosis not present

## 2022-01-04 DIAGNOSIS — Z599 Problem related to housing and economic circumstances, unspecified: Secondary | ICD-10-CM

## 2022-01-04 DIAGNOSIS — H903 Sensorineural hearing loss, bilateral: Secondary | ICD-10-CM

## 2022-01-04 DIAGNOSIS — Z0000 Encounter for general adult medical examination without abnormal findings: Secondary | ICD-10-CM

## 2022-01-04 NOTE — Progress Notes (Signed)
Subjective:   Emily Wagner is a 88 y.o. female who presents for Medicare Annual (Subsequent) preventive examination.  Virtual Visit via Telephone Note  I connected with  Emily Wagner on 01/04/22 at 11:15 AM EST by telephone and verified that I am speaking with the correct person using two identifiers.  Location: Patient: Home Provider: [facility] Persons participating in the virtual visit: patient/Nurse Health Advisor   I discussed the limitations, risks, security and privacy concerns of performing an evaluation and management service by telephone and the availability of in person appointments. The patient expressed understanding and agreed to proceed.  Interactive audio and video telecommunications were attempted between this nurse and patient, however failed, due to patient having technical difficulties OR patient did not have access to video capability.  We continued and completed visit with audio only.  Some vital signs may be absent or patient reported.   Jeremiyah Cullens E Aliz Meritt, LPN   Review of Systems     Cardiac Risk Factors include: advanced age (>35men, >90 women);sedentary lifestyle;dyslipidemia;hypertension;Other (see comment), Risk factor comments: atherosclerosis, hx of TIA, hx of polio     Objective:    Today's Vitals   01/04/22 1122  Weight: 137 lb (62.1 kg)  PainSc: 4    Body mass index is 23.52 kg/m.  Advanced Directives 01/04/2022 03/27/2021 01/03/2021 01/03/2021 08/23/2018 10/29/2015 10/21/2015  Does Patient Have a Medical Advance Directive? Yes No Yes No;Yes No;Yes Yes No  Type of Advance Directive Living will;Healthcare Power of [place];Living will - -  Does patient want to make changes to medical advance directive? - - - - - - -  Copy of [place] in Chart? Yes - validated most recent copy scanned in chart (See row information) - - - No - copy requested - -  Would patient like  information on creating a medical advance directive? - No - Guardian declined - No - Patient declined No - Patient declined - -    Current Medications (verified) Outpatient Encounter Medications as of 01/04/2022  Medication Sig   acetaminophen (TYLENOL) 325 MG tablet Take 325 mg by mouth every 6 (six) hours as needed.   amLODipine (NORVASC) 5 MG tablet Take 1 tablet (5 mg total) by mouth daily.   apixaban (ELIQUIS) 5 MG TABS tablet Take 1 tablet (5 mg total) by mouth 2 (two) times daily.   bethanechol (URECHOLINE) 10 MG tablet Take 1 tablet (10 mg total) by mouth 3 (three) times daily.   cholecalciferol (VITAMIN D3) 25 MCG (1000 UNIT) tablet Take 1,000 Units by mouth daily.   Cyanocobalamin (VITAMIN B-12 PO) Take 500 mcg by mouth every morning.   famotidine (PEPCID) 20 MG tablet Take 1 tablet (20 mg total) by mouth 2 (two) times daily.   hydrochlorothiazide (HYDRODIURIL) 25 MG tablet Take 1 tablet (25 mg total) by mouth daily.   levothyroxine (SYNTHROID) 50 MCG tablet Take 1 tablet (50 mcg total) by mouth daily before breakfast.   Multiple Vitamins-Minerals (PRESERVISION AREDS 2 PO) Take 1 capsule by mouth at bedtime.   QUEtiapine (SEROQUEL) 50 MG tablet Take 1 tablet (50 mg total) by mouth at bedtime.   senna (SENOKOT) 8.6 MG TABS tablet Take 1 tablet by mouth.   meclizine (ANTIVERT) 12.5 MG tablet Take 1 tablet (12.5 mg total) by mouth 3 (three) times daily as needed for dizziness. (Patient not taking: Reported on 01/04/2022)   Facility-Administered Encounter Medications as of  01/04/2022  Medication   methylPREDNISolone acetate (DEPO-MEDROL) injection 80 mg    Allergies (verified) Nitrofurantoin, Nsaids, Other, Flonase [fluticasone propionate], Prevnar 13 [pneumococcal 13-val conj vacc], Beta adrenergic blockers, Nortriptyline hcl, Potassium-containing compounds, Alendronate sodium, Ciprofloxacin, Codeine, Crestor [rosuvastatin calcium], Darvon, Lipitor [atorvastatin calcium], Pravachol,  Propranolol hcl, Sulfa antibiotics, Sulfamethoxazole, Ultram [tramadol hcl], Welchol [colesevelam hcl], Zetia [ezetimibe], and Zocor [simvastatin]   History: Past Medical History:  Diagnosis Date   Adjustment disorder with mixed anxiety and depressed mood    Atrial fibrillation ([place]) 11/04/2015   Post-operative   Benign hypertensive heart disease without heart failure    CAD (coronary artery disease)    Infection of urinary tract 10/31/2015   Interstitial cystitis    Osteopenia    Other and unspecified hyperlipidemia    Other dyspnea and respiratory abnormality    Pelvic fracture ([facility]) 05/30/2015   Post-polio syndrome    polio age 42   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   LIMA to LAD   Stroke [facility])    Past Surgical History:  Procedure Laterality Date   ABDOMINAL HYSTERECTOMY     Cancer and endometriosis   CARDIAC CATHETERIZATION  2008   CARDIAC CATHETERIZATION N/A 10/29/2015   Procedure: Left Heart Cath and Coronary Angiography;  Surgeon: Belva Crome, MD;  Location: [place] [facility];  Service: Cardiovascular;  Laterality: N/A;   CATARACT EXTRACTION     CHOLECYSTECTOMY     CORONARY ARTERY BYPASS GRAFT N/A 11/02/2015   Procedure: OFF PUMP CORONARY ARTERY BYPASS GRAFTING (CABG) TIMES ONE;  Surgeon: Rexene Alberts, MD;  Location: [place];  Service: Open Heart Surgery;  Laterality: N/A;  LIMA to LAD   FEMUR FRACTURE SURGERY  09/2015   right hip compression screws    FRACTURE SURGERY     Right hip   HERNIA REPAIR     Right inguinal   TEE WITHOUT CARDIOVERSION N/A 11/02/2015   Procedure: TRANSESOPHAGEAL ECHOCARDIOGRAM (TEE);  Surgeon: Rexene Alberts, MD;  Location: [place];  Service: Open Heart Surgery;  Laterality: N/A;   Family History  Problem Relation Age of Onset   CAD Mother 27       Died suddenly   CAD Father 62       Died suddenly   CAD Daughter 81   Diabetes type I Daughter        Since age 48   Heart disease Sister    Osteoporosis Sister    Social History    Socioeconomic History   Marital status: Widowed    Spouse name: Not on file   Number of children: 2   Years of education: Not on file   Highest education level: High school graduate  Occupational History   Occupation: retired    Fish farm manager: [employer]    Comment: admin. assistant  Tobacco Use   Smoking status: Never   Smokeless tobacco: Never  Vaping Use   Vaping Use: Never used  Substance and Sexual Activity   Alcohol use: No   Drug use: No   Sexual activity: Not on file  Other Topics Concern   Not on file  Social History Narrative   01/04/22 lives alone   Daughter and granddaughter live close by and visit daily   Right Handed   Drinks no caffeine daily   Social Determinants of Health   Financial Resource Strain: Medium Risk   Difficulty of Paying Living Expenses: Somewhat hard  Food Insecurity: Food Insecurity Present   Worried About Running Out  of Food in the Last Year: Sometimes true   Ran Out of Food in the Last Year: Sometimes true  Transportation Needs: No Transportation Needs   Lack of Transportation (Medical): No   Lack of Transportation (Non-Medical): No  Physical Activity: Insufficiently Active   Days of Exercise per Week: 7 days   Minutes of Exercise per Session: 10 min  Stress: No Stress Concern Present   Feeling of Stress : Not at all  Social Connections: Moderately Integrated   Frequency of Communication with Friends and Family: More than three times a week   Frequency of Social Gatherings with Friends and Family: More than three times a week   Attends Religious Services: More than 4 times per year   Active Member of [employer] or Organizations: Yes   Attends Archivist Meetings: More than 4 times per year   Marital Status: Widowed    Tobacco Counseling Counseling given: Not Answered   Clinical Intake:  Pre-visit preparation completed: Yes  Pain : 0-10 Pain Score: 4  Pain Type: Chronic pain Pain Location: Hip Pain Orientation: Right,  Left Pain Descriptors / Indicators: Aching, Discomfort Pain Onset: More than a month ago Pain Frequency: Intermittent     BMI - recorded: 23.52 Nutritional Status: BMI of 19-24  Normal Nutritional Risks: None Diabetes: No  How often do you need to have someone help you when you read instructions, pamphlets, or other written materials from your doctor or pharmacy?: 1 - Never  Diabetic? no  Interpreter Needed?: No  Information entered by :: Norval Slaven, LPN   Activities of Daily Living In your present state of health, do you have any difficulty performing the following activities: 01/04/2022  Hearing? Y  Comment wears hearing aids  Vision? N  Difficulty concentrating or making decisions? N  Walking or climbing stairs? Y  Dressing or bathing? N  Doing errands, shopping? Y  Comment only drives short distances - daughter drives her  Conservation officer, nature and eating ? N  Using the Toilet? N  In the past six months, have you accidently leaked urine? N  Do you have problems with loss of bowel control? N  Managing your Medications? N  Managing your Finances? N  Housekeeping or managing your Housekeeping? N  Some recent data might be hidden    Patient Care Team: [name], [name], MD as PCP - General (Family Medicine) Minus Breeding, MD (Cardiology) Steffanie Rainwater, DPM as Consulting Physician (Podiatry) [facility]  Indicate any recent Medical Services you may have received from other than [facility] providers in the past year (date may be approximate).     Assessment:   This is a routine wellness examination for Emily Wagner.  Hearing/Vision screen Hearing Screening - Comments:: Wears hearing aids - from [facility] in [place] - Comments:: Wears rx glasses - up to date with routine eye exams with [facility]  Dietary issues and exercise activities discussed: Current Exercise Habits: Home exercise routine, Type of exercise: walking, Time (Minutes): 10, Frequency  (Times/Week): 7, Weekly Exercise (Minutes/Week): 70, Intensity: Mild, Exercise limited by: neurologic condition(s);cardiac condition(s)   Goals Addressed             This Visit's Progress    Prevent falls   On track    Stay active Get some energy back        Depression Screen PHQ 2/9 Scores 01/04/2022 12/08/2021 09/29/2021 07/14/2021 05/18/2021 05/06/2021 05/02/2021  PHQ - 2 Score 0 0 0 0 6 0 0  PHQ- 9 Score 0 - 0 5 17 - 0    Fall Risk Fall Risk  01/04/2022 12/08/2021 09/29/2021 09/20/2021 07/14/2021  Falls in the past year? 0 0 0 0 1  Number falls in past yr: 0 - - - 0  Injury with Fall? 0 - - - 1  Comment - - - - -  Risk Factor Category  - - - - -  Risk for fall due to : Orthopedic patient;Impaired balance/gait;Medication side effect - - - Impaired balance/gait;History of fall(s);Impaired mobility  Follow up Falls prevention discussed;Education provided - - - Falls evaluation completed    FALL RISK PREVENTION PERTAINING TO THE HOME:  Any stairs in or around the home? No  If so, are there any without handrails? No  Home free of loose throw rugs in walkways, pet beds, electrical cords, etc? Yes  Adequate lighting in your home to reduce risk of falls? Yes   ASSISTIVE DEVICES UTILIZED TO PREVENT FALLS:  Life alert? Yes  Use of a cane, walker or w/c? Yes  Grab bars in the bathroom? Yes  Shower chair or bench in shower? Yes  Elevated toilet seat or a handicapped toilet? Yes   TIMED UP AND GO:  Was the test performed? No . Telephonic visit  Cognitive Function: Normal cognitive status assessed by direct observation by this Nurse Health Advisor. No abnormalities found.    MMSE - Mini Mental State Exam 09/20/2021 08/23/2018  Orientation to time 5 5  Orientation to Place 5 5  Registration 3 3  Attention/ Calculation 5 5  Recall 2 3  Language- name 2 objects 2 2  Language- repeat 1 1  Language- follow 3 step command 3 3  Language- read & follow direction 1 1  Write a sentence 1  1  Copy design 1 1  Total score 29 30     6CIT Screen 01/03/2021  What Year? 0 points  What month? 0 points  What time? 0 points  Count back from 20 0 points  Months in reverse 0 points  Repeat phrase 0 points    Immunizations Immunization History  Administered Date(s) Administered   Fluad Quad(high Dose 65+) 08/18/2019, 08/23/2020, 08/17/2021   Influenza Whole 07/28/2010   Influenza, High Dose Seasonal PF 07/15/2016, 09/03/2017, 08/23/2018   Influenza,inj,Quad PF,6+ Mos 09/23/2013, 09/09/2014, 09/07/2015   Moderna Sars-Covid-2 Vaccination 01/06/2020, 02/03/2020   Pneumococcal Conjugate-13 05/14/2014   Pneumococcal Polysaccharide-23 08/18/2019   Td 04/14/2008   Zoster Recombinat (Shingrix) 12/08/2021    TDAP status: Due, Education has been provided regarding the importance of this vaccine. Advised may receive this vaccine at local pharmacy or Health Dept. Aware to provide a copy of the vaccination record if obtained from local pharmacy or Health Dept. Verbalized acceptance and understanding.  Flu Vaccine status: Up to date  Pneumococcal vaccine status: Up to date  Covid-19 vaccine status: Completed vaccines  Qualifies for Shingles Vaccine? Yes   Zostavax completed No   Shingrix Completed?: No.    Education has been provided regarding the importance of this vaccine. Patient has been advised to call insurance company to determine out of pocket expense if they have not yet received this vaccine. Advised may also receive vaccine at local pharmacy or Health Dept. Verbalized acceptance and understanding.  Screening Tests Health Maintenance  Topic Date Due   TETANUS/TDAP  04/14/2018   COVID-19 Vaccine (3 - Moderna risk series) 05/08/2022 (Originally 03/02/2020)   Zoster Vaccines- Shingrix (2 of 2) 02/02/2022   Pneumonia Vaccine  46+ Years old  Completed   INFLUENZA VACCINE  Completed   DEXA SCAN  Completed   HPV [place] Maintenance  Due  Topic Date Due   TETANUS/TDAP  04/14/2018    Colorectal cancer screening: No longer required.   Mammogram status: No longer required due to age.  Bone Density status: Completed 09/29/2016. Results reflect: Bone density results: OSTEOPENIA. Repeat every 2-5 years.  Lung Cancer Screening: (Low Dose CT Chest recommended if Age 35-80 years, 30 pack-year currently smoking OR have quit w/in 15years.) does not qualify.   Additional Screening:  Hepatitis C Screening: does not qualify  Vision Screening: Recommended annual ophthalmology exams for early detection of glaucoma and other disorders of the eye. Is the patient up to date with their annual eye exam?  Yes  Who is the provider or what is the name of the office in which the patient attends annual eye exams? [facility] If pt is not established with a provider, would they like to be referred to a provider to establish care? No .   Dental Screening: Recommended annual dental exams for proper oral hygiene  Community Resource Referral / Chronic Care Management: CRR required this visit?  No   CCM required this visit?  No      Plan:     I have personally reviewed and noted the following in the patients chart:   Medical and social history Use of alcohol, tobacco or illicit drugs  Current medications and supplements including opioid prescriptions.  Functional ability and status Nutritional status Physical activity Advanced directives List of other physicians Hospitalizations, surgeries, and ER visits in previous 12 months Vitals Screenings to include cognitive, depression, and falls Referrals and appointments  In addition, I have reviewed and discussed with patient certain preventive protocols, quality metrics, and best practice recommendations. A written personalized care plan for preventive services as well as general preventive health recommendations were provided to patient.     Sandrea Hammond, LPN   07/30/7341   Nurse  Notes: None

## 2022-01-04 NOTE — Patient Instructions (Signed)
Emily Wagner , Thank you for taking time to come for your Medicare Wellness Visit. I appreciate your ongoing commitment to your health goals. Please review the following plan we discussed and let me know if I can assist you in the future.   Screening recommendations/referrals: Colonoscopy: No longer required Mammogram: No longer required Bone Density: Done 09/29/2016 - repeat every 2-5 years Recommended yearly ophthalmology/optometry visit for glaucoma screening and checkup Recommended yearly dental visit for hygiene and checkup  Vaccinations: Influenza vaccine: Done 08/17/2021 -Repeat annually  Pneumococcal vaccine: Done 05/14/2014 & 08/18/2019 Tdap vaccine: Done 04/14/2008 - Repeat in 10 years *past due Shingles vaccine: Done 12/08/2021 - get second dose 2-6 months later   Covid-19: Done 01/06/2020 & 02/03/2020 (declines boosters due to bad reaction with last one)  Advanced directives: in chart  Conditions/risks identified: Aim for 30 minutes of exercise each day as tolerated, drink 6-8 glasses of water and eat lots of fruits and vegetables.   Next appointment: Follow up in one year for your annual wellness visit    Preventive Care 86 Years and Older, Female Preventive care refers to lifestyle choices and visits with your health care provider that can promote health and wellness. What does preventive care include? A yearly physical exam. This is also called an annual well check. Dental exams once or twice a year. Routine eye exams. Ask your health care provider how often you should have your eyes checked. Personal lifestyle choices, including: Daily care of your teeth and gums. Regular physical activity. Eating a healthy diet. Avoiding tobacco and drug use. Limiting alcohol use. Practicing safe sex. Taking low-dose aspirin every day. Taking vitamin and mineral supplements as recommended by your health care provider. What happens during an annual well check? The services and screenings  done by your health care provider during your annual well check will depend on your age, overall health, lifestyle risk factors, and family history of disease. Counseling  Your health care provider may ask you questions about your: Alcohol use. Tobacco use. Drug use. Emotional well-being. Home and relationship well-being. Sexual activity. Eating habits. History of falls. Memory and ability to understand (cognition). Work and work Statistician. Reproductive health. Screening  You may have the following tests or measurements: Height, weight, and BMI. Blood pressure. Lipid and cholesterol levels. These may be checked every 5 years, or more frequently if you are over 69 years old. Skin check. Lung cancer screening. You may have this screening every year starting at age 69 if you have a 30-pack-year history of smoking and currently smoke or have quit within the past 15 years. Fecal occult blood test (FOBT) of the stool. You may have this test every year starting at age 25. Flexible sigmoidoscopy or colonoscopy. You may have a sigmoidoscopy every 5 years or a colonoscopy every 10 years starting at age 15. Hepatitis C blood test. Hepatitis B blood test. Sexually transmitted disease (STD) testing. Diabetes screening. This is done by checking your blood sugar (glucose) after you have not eaten for a while (fasting). You may have this done every 1-3 years. Bone density scan. This is done to screen for osteoporosis. You may have this done starting at age 10. Mammogram. This may be done every 1-2 years. Talk to your health care provider about how often you should have regular mammograms. Talk with your health care provider about your test results, treatment options, and if necessary, the need for more tests. Vaccines  Your health care provider may recommend certain vaccines, such  as: Influenza vaccine. This is recommended every year. Tetanus, diphtheria, and acellular pertussis (Tdap, Td)  vaccine. You may need a Td booster every 10 years. Zoster vaccine. You may need this after age 9. Pneumococcal 13-valent conjugate (PCV13) vaccine. One dose is recommended after age 52. Pneumococcal polysaccharide (PPSV23) vaccine. One dose is recommended after age 74. Talk to your health care provider about which screenings and vaccines you need and how often you need them. This information is not intended to replace advice given to you by your health care provider. Make sure you discuss any questions you have with your health care provider. Document Released: 12/10/2015 Document Revised: 08/02/2016 Document Reviewed: 09/14/2015 Elsevier Interactive Patient Education  2017 [place] Prevention in the Home Falls can cause injuries. They can happen to people of all ages. There are many things you can do to make your home safe and to help prevent falls. What can I do on the outside of my home? Regularly fix the edges of walkways and driveways and fix any cracks. Remove anything that might make you trip as you walk through a door, such as a raised step or threshold. Trim any bushes or trees on the path to your home. Use bright outdoor lighting. Clear any walking paths of anything that might make someone trip, such as rocks or tools. Regularly check to see if handrails are loose or broken. Make sure that both sides of any steps have handrails. Any raised decks and porches should have guardrails on the edges. Have any leaves, snow, or ice cleared regularly. Use sand or salt on walking paths during winter. Clean up any spills in your garage right away. This includes oil or grease spills. What can I do in the bathroom? Use night lights. Install grab bars by the toilet and in the tub and shower. Do not use towel bars as grab bars. Use non-skid mats or decals in the tub or shower. If you need to sit down in the shower, use a plastic, non-slip stool. Keep the floor dry. Clean up any  water that spills on the floor as soon as it happens. Remove soap buildup in the tub or shower regularly. Attach bath mats securely with double-sided non-slip rug tape. Do not have throw rugs and other things on the floor that can make you trip. What can I do in the bedroom? Use night lights. Make sure that you have a light by your bed that is easy to reach. Do not use any sheets or blankets that are too big for your bed. They should not hang down onto the floor. Have a firm chair that has side arms. You can use this for support while you get dressed. Do not have throw rugs and other things on the floor that can make you trip. What can I do in the kitchen? Clean up any spills right away. Avoid walking on wet floors. Keep items that you use a lot in easy-to-reach places. If you need to reach something above you, use a strong step stool that has a grab bar. Keep electrical cords out of the way. Do not use floor polish or wax that makes floors slippery. If you must use wax, use non-skid floor wax. Do not have throw rugs and other things on the floor that can make you trip. What can I do with my stairs? Do not leave any items on the stairs. Make sure that there are handrails on both sides of the stairs and use  them. Fix handrails that are broken or loose. Make sure that handrails are as long as the stairways. Check any carpeting to make sure that it is firmly attached to the stairs. Fix any carpet that is loose or worn. Avoid having throw rugs at the top or bottom of the stairs. If you do have throw rugs, attach them to the floor with carpet tape. Make sure that you have a light switch at the top of the stairs and the bottom of the stairs. If you do not have them, ask someone to add them for you. What else can I do to help prevent falls? Wear shoes that: Do not have high heels. Have rubber bottoms. Are comfortable and fit you well. Are closed at the toe. Do not wear sandals. If you use a  stepladder: Make sure that it is fully opened. Do not climb a closed stepladder. Make sure that both sides of the stepladder are locked into place. Ask someone to hold it for you, if possible. Clearly mark and make sure that you can see: Any grab bars or handrails. First and last steps. Where the edge of each step is. Use tools that help you move around (mobility aids) if they are needed. These include: Canes. Walkers. Scooters. Crutches. Turn on the lights when you go into a dark area. Replace any light bulbs as soon as they burn out. Set up your furniture so you have a clear path. Avoid moving your furniture around. If any of your floors are uneven, fix them. If there are any pets around you, be aware of where they are. Review your medicines with your doctor. Some medicines can make you feel dizzy. This can increase your chance of falling. Ask your doctor what other things that you can do to help prevent falls. This information is not intended to replace advice given to you by your health care provider. Make sure you discuss any questions you have with your health care provider. Document Released: 09/09/2009 Document Revised: 04/20/2016 Document Reviewed: 12/18/2014 Elsevier Interactive Patient Education  2017 [employer].

## 2022-01-05 DIAGNOSIS — L218 Other seborrheic dermatitis: Secondary | ICD-10-CM | POA: Diagnosis not present

## 2022-01-05 DIAGNOSIS — L570 Actinic keratosis: Secondary | ICD-10-CM | POA: Diagnosis not present

## 2022-01-05 DIAGNOSIS — X32XXXA Exposure to sunlight, initial encounter: Secondary | ICD-10-CM | POA: Diagnosis not present

## 2022-01-06 ENCOUNTER — Telehealth: Payer: Self-pay

## 2022-01-06 NOTE — Telephone Encounter (Signed)
° °  Telephone encounter was:  Successful.  01/06/2022 Name: Emily Wagner MRN: [id number] DOB: 1933/07/01  [name] is a 88 y.o. year old female who is a primary care patient of [name], [name], MD . The community resource team was consulted for assistance with Financial Difficulties related to [place] guide performed the following interventions: Patient provided with information about care guide support team and interviewed to confirm resource needs.Patient stated she cant afford her medicine and hearing aid bill, her medical bills are high and she cant afford to pay. I will be mailing resources to her and follow up in a week  Follow Up Plan:  Care guide will follow up with patient by phone over the next [place], [place], Care Management  [phone] [address], [place], [place] [address] Phone: [phone] Email: [email or web address]

## 2022-01-09 ENCOUNTER — Telehealth: Payer: Self-pay

## 2022-01-09 NOTE — Telephone Encounter (Signed)
° °  Telephone encounter was:  Unsuccessful.  01/09/2022 Name: Emily Wagner MRN: [id number] DOB: 1933-09-05  [place], Care Management  [phone] [address], [place], [place] [address] Phone: [phone] Email: [email or web address]

## 2022-01-11 ENCOUNTER — Encounter: Payer: Self-pay | Admitting: Family Medicine

## 2022-01-11 ENCOUNTER — Telehealth: Payer: Self-pay | Admitting: Cardiology

## 2022-01-11 ENCOUNTER — Ambulatory Visit (INDEPENDENT_AMBULATORY_CARE_PROVIDER_SITE_OTHER): Payer: Medicare Other | Admitting: Family Medicine

## 2022-01-11 DIAGNOSIS — J40 Bronchitis, not specified as acute or chronic: Secondary | ICD-10-CM | POA: Diagnosis not present

## 2022-01-11 DIAGNOSIS — I480 Paroxysmal atrial fibrillation: Secondary | ICD-10-CM

## 2022-01-11 MED ORDER — AMOXICILLIN 500 MG PO CAPS: 500.0000 mg | ORAL_CAPSULE | Freq: Two times a day (BID) | ORAL | 0 refills | Status: DC

## 2022-01-11 MED ORDER — BENZONATATE 100 MG PO CAPS: 100.0000 mg | ORAL_CAPSULE | Freq: Three times a day (TID) | ORAL | 0 refills | Status: DC | PRN

## 2022-01-11 NOTE — Progress Notes (Signed)
Virtual Visit via telephone Note  I connected with Emily Wagner on 01/11/22 at [facility] by telephone and verified that I am speaking with the correct person using two identifiers. Emily Wagner is currently located at home and patient are currently with her during visit. The provider, [name] Burnard Enis, MD is located in their office at time of visit.  Call ended at [phone]  I discussed the limitations, risks, security and privacy concerns of performing an evaluation and management service by telephone and the availability of in person appointments. I also discussed with the patient that there may be a patient responsible charge related to this service. The patient expressed understanding and agreed to proceed.  Bp 100/60 HR 73 History and Present Illness: Patient is calling in for cough and chest congestion. This started 5 days ago and is coughing all night and not sleeping and is hoarse.  She has a dry cough and is not feeling well. Took a covid test yesterday and it was negative.  She has been using robitussin honey and it helps some but not breaking it loose.  She is not short of breath or wheezy. She is having some congestion and is coughing and is worsening.   1. Bronchitis     Outpatient Encounter Medications as of 01/11/2022  Medication Sig   amoxicillin (AMOXIL) 500 MG capsule Take 1 capsule (500 mg total) by mouth 2 (two) times daily.   benzonatate (TESSALON PERLES) 100 MG capsule Take 1 capsule (100 mg total) by mouth 3 (three) times daily as needed for cough.   acetaminophen (TYLENOL) 325 MG tablet Take 325 mg by mouth every 6 (six) hours as needed.   amLODipine (NORVASC) 5 MG tablet Take 1 tablet (5 mg total) by mouth daily.   apixaban (ELIQUIS) 5 MG TABS tablet Take 1 tablet (5 mg total) by mouth 2 (two) times daily.   bethanechol (URECHOLINE) 10 MG tablet Take 1 tablet (10 mg total) by mouth 3 (three) times daily.   cholecalciferol (VITAMIN D3) 25 MCG (1000 UNIT) tablet  Take 1,000 Units by mouth daily.   Cyanocobalamin (VITAMIN B-12 PO) Take 500 mcg by mouth every morning.   famotidine (PEPCID) 20 MG tablet Take 1 tablet (20 mg total) by mouth 2 (two) times daily.   hydrochlorothiazide (HYDRODIURIL) 25 MG tablet Take 1 tablet (25 mg total) by mouth daily.   levothyroxine (SYNTHROID) 50 MCG tablet Take 1 tablet (50 mcg total) by mouth daily before breakfast.   meclizine (ANTIVERT) 12.5 MG tablet Take 1 tablet (12.5 mg total) by mouth 3 (three) times daily as needed for dizziness. (Patient not taking: Reported on 01/04/2022)   Multiple Vitamins-Minerals (PRESERVISION AREDS 2 PO) Take 1 capsule by mouth at bedtime.   QUEtiapine (SEROQUEL) 50 MG tablet Take 1 tablet (50 mg total) by mouth at bedtime.   senna (SENOKOT) 8.6 MG TABS tablet Take 1 tablet by mouth.   Facility-Administered Encounter Medications as of 01/11/2022  Medication   methylPREDNISolone acetate (DEPO-MEDROL) injection 80 mg    Review of Systems  Constitutional:  Negative for chills and fever.  HENT:  Positive for congestion, postnasal drip and rhinorrhea. Negative for ear discharge, ear pain, sinus pressure, sneezing and sore throat.   Eyes:  Negative for pain, redness and visual disturbance.  Respiratory:  Positive for cough. Negative for chest tightness, shortness of breath and wheezing.   Cardiovascular:  Negative for chest pain and leg swelling.  Genitourinary:  Negative for difficulty urinating  and dysuria.  Musculoskeletal:  Negative for back pain and gait problem.  Skin:  Negative for rash.  Neurological:  Negative for light-headedness and headaches.  Psychiatric/Behavioral:  Negative for agitation and behavioral problems.   All other systems reviewed and are negative.  Observations/Objective: Patient sounds very hoarse and very ill on the phone and her cough is very thick and heavy.  Assessment and Plan: Problem List Items Addressed This Visit   None Visit Diagnoses      Bronchitis    -  Primary   Relevant Medications   benzonatate (TESSALON PERLES) 100 MG capsule   amoxicillin (AMOXIL) 500 MG capsule     We will treat like bronchitis, already tested COVID-negative.  Will give amoxicillin and Tessalon Perles.  If anything worsens give [name] call back.  If worsens call us back or go to the emergency department  Follow up plan: Return if symptoms worsen or fail to improve.     I discussed the assessment and treatment plan with the patient. The patient was provided an opportunity to ask questions and all were answered. The patient agreed with the plan and demonstrated an understanding of the instructions.   The patient was advised to call back or seek an in-person evaluation if the symptoms worsen or if the condition fails to improve as anticipated.  The above assessment and management plan was discussed with the patient. The patient verbalized understanding of and has agreed to the management plan. Patient is aware to call the clinic if symptoms persist or worsen. Patient is aware when to return to the clinic for a follow-up visit. Patient educated on when it is appropriate to go to the emergency department.    I provided 6 minutes of non-face-to-face time during this encounter.    Worthy Rancher, MD

## 2022-01-11 NOTE — Telephone Encounter (Signed)
Attempted to reach the patient. Was unable to leave a message 

## 2022-01-11 NOTE — Telephone Encounter (Signed)
° °  Pt c/o medication issue:  1. Name of Medication: apixaban (ELIQUIS) 5 MG TABS tablet  2. How are you currently taking this medication (dosage and times per day)? Take 1 tablet (5 mg total) by mouth 2 (two) times daily.  3. Are you having a reaction (difficulty breathing--STAT)?   4. What is your medication issue? Pt is requesting a call from Dr. Rosezella [place] nurse, she said its about her eliquis

## 2022-01-13 ENCOUNTER — Telehealth: Payer: Self-pay

## 2022-01-13 NOTE — Telephone Encounter (Signed)
° °  Telephone encounter was:  Successful.  01/13/2022 Name: Carlisa Eble MRN: [id number] DOB: [birth year]  [name] is a 88 y.o. year old female who is a primary care patient of [name], [name], MD . The community resource team was consulted for assistance with [place] guide performed the following interventions: Patient provided with information about care guide support team and interviewed to confirm resource needs.Mailing food resources at her request during a follow up call  Follow Up Plan:  No further follow up planned at this time. The patient has been provided with needed resources.    [place], Care Management  [phone] [address], [place], [place] [address] Phone: [phone] Email: [email or web address]

## 2022-01-16 ENCOUNTER — Encounter: Payer: Self-pay | Admitting: Cardiology

## 2022-01-16 ENCOUNTER — Other Ambulatory Visit: Payer: Self-pay | Admitting: Family Medicine

## 2022-01-16 ENCOUNTER — Telehealth: Payer: Self-pay | Admitting: Family Medicine

## 2022-01-16 DIAGNOSIS — J40 Bronchitis, not specified as acute or chronic: Secondary | ICD-10-CM

## 2022-01-16 MED ORDER — MOLNUPIRAVIR EUA 200MG CAPSULE: 4.0000 | ORAL_CAPSULE | Freq: Two times a day (BID) | ORAL | 0 refills | Status: AC

## 2022-01-16 MED ORDER — BENZONATATE 100 MG PO CAPS: 100.0000 mg | ORAL_CAPSULE | Freq: Three times a day (TID) | ORAL | 0 refills | Status: DC | PRN

## 2022-01-16 MED ORDER — APIXABAN 5 MG PO TABS: 5.0000 mg | ORAL_TABLET | Freq: Two times a day (BID) | ORAL | 1 refills | Status: DC

## 2022-01-16 NOTE — Telephone Encounter (Signed)
No. Take both

## 2022-01-16 NOTE — Telephone Encounter (Signed)
Sent in antivirals today can she stop abx? Please advise

## 2022-01-16 NOTE — Telephone Encounter (Signed)
[facility] called back and said they spoke with the pharmacist and paxlovid would not be a good option for patient they are recommending something else. They will route a message to [name].

## 2022-01-16 NOTE — Telephone Encounter (Signed)
error 

## 2022-01-16 NOTE — Telephone Encounter (Signed)
I sent in the benzonatate also.

## 2022-01-16 NOTE — Telephone Encounter (Signed)
Patient aware and verbalized understanding. °

## 2022-01-16 NOTE — Telephone Encounter (Signed)
Called and spoke with patient she felt really bad states she feels like death warmed over. Seen [name] last week and given amxoicillin  and tessalon perls. COVID test was - last week. Took at home one yesterday and it is +. She spoke to [facility] and they said she could do the paxlovid but her eliqius would need adjusted if she done that. 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

## 2022-01-16 NOTE — Telephone Encounter (Signed)
Patient aware and verbalized understanding. She is out asking if she needs another round or the one round of antibiotics was good enough

## 2022-01-16 NOTE — Telephone Encounter (Signed)
Spoke with pt, she has tested positive for covid and is wondering if she can take paxlovid. She also needs a 90 days supply for the eliquis to [facility] in [place]. Aware will forward to the pharm md for refill and to review medication interactions with paxlovid.

## 2022-01-16 NOTE — Telephone Encounter (Signed)
Recommend she be prescribed molnupiravir instead of Paxlovid for COVID treatment since Paxlovid interacts with multiple medications she takes (should not be taken with Seroquel which is the most problematic interaction, Eliquis dose would need to be reduced by 50%, also interacts with her levothyroxine and amlodipine). I have sent in her Eliquis refill.

## 2022-01-16 NOTE — Telephone Encounter (Signed)
Spoke to patient . Information given.   Patient aware will route information to  [facility] - Dr Warrick Parisian 's office.   RN  spoke to [place]- information routed

## 2022-01-16 NOTE — Telephone Encounter (Signed)
One round was good enough

## 2022-01-16 NOTE — Telephone Encounter (Signed)
Pt is returning call.  

## 2022-01-16 NOTE — Telephone Encounter (Signed)
I sent in molnupiravir for her.

## 2022-01-17 NOTE — Telephone Encounter (Signed)
Pt has been informed and understood. 

## 2022-01-18 ENCOUNTER — Ambulatory Visit: Payer: Medicare Other | Admitting: Cardiology

## 2022-01-23 NOTE — Telephone Encounter (Signed)
° °  Telephone encounter was:  Successful.  01/23/2022 Name: Emily Wagner MRN: [id number] DOB: [birth year]  Emily Wagner is a 88 y.o. year old female who is a primary care patient of [name], [name], MD . The community resource team was consulted for assistance with  hearing aid  Care guide performed the following interventions: Patient provided with information about care guide support team and interviewed to confirm resource needs.mail confirmation  Follow Up Plan:  No further follow up planned at this time. The patient has been provided with needed resources.    [place], Care Management  [phone] [address], [place], [place] [address] Phone: [phone] Email: [email or web address]

## 2022-01-25 NOTE — Telephone Encounter (Signed)
Pt c/o medication issue: ? ?1. Name of Medication:  ?apixaban (ELIQUIS) 5 MG TABS tablet ? ?2. How are you currently taking this medication (dosage and times per day)?  ? ?3. Are you having a reaction (difficulty breathing--STAT)?  ? ?4. What is your medication issue?  ? ?Patient is following up, requesting an update on [facility] patient assistance application. ? ?

## 2022-01-26 ENCOUNTER — Ambulatory Visit: Payer: Medicare Other | Admitting: Family Medicine

## 2022-01-26 NOTE — Telephone Encounter (Signed)
Spoke with pt, according to [employer] they have all of the information needed and she does qualify for assistance and the person at [facility] is going to get her approved today. ?

## 2022-02-09 NOTE — Telephone Encounter (Signed)
Approval letter for patient assistance for eliquis received. ?

## 2022-02-13 NOTE — Progress Notes (Signed)
?  ?Cardiology Office Note ? ? ?Date:  02/15/2022  ? ?ID:  [name], DOB [birth year], MRN [id number] ? ?PCP:  [name], [name], MD  ?Cardiologist:   None ? ? ?Chief Complaint  ?Patient presents with  ? Atrial Fibrillation  ? ? ? ? ?  ?History of Present Illness: ?Emily Wagner is a 88 y.o. female who presents for evaluation of atrial fib.     She has a history of  bypass surgery.  She has had fluctuating BPs.  She has a diagnosis of post polio syndrome.   She has chronic fatigue.   She had an acute CVA in May.  She was hospitalized at [facility] with acute ischemic right MCA stroke.  She had thrombectomy.   I had her wear a monitor which demonstrated paroxysmal atrial fibrillation.  ? ?She had COVID in February and was quite sick but she did not go to the hospital.  She is in atrial fibrillation today but she really does not notice this.  Heart rates in the 130s walking back to the office.  She is weak and walks with a cane.  She is not having any acute symptoms however. The patient denies any new symptoms such as chest discomfort, neck or arm discomfort. There has been no new shortness of breath, PND or orthopnea. There have been no reported palpitations, presyncope or syncope.  ? ? ? ?Past Medical History:  ?Diagnosis Date  ? Adjustment disorder with mixed anxiety and depressed mood   ? Atrial fibrillation ([place]) 11/04/2015  ? Post-operative  ? Benign hypertensive heart disease without heart failure   ? CAD (coronary artery disease)   ? Infection of urinary tract 10/31/2015  ? Interstitial cystitis   ? Osteopenia   ? Other and unspecified hyperlipidemia   ? Other dyspnea and respiratory abnormality   ? Pelvic fracture ([place]) 05/30/2015  ? Post-polio syndrome   ? polio age 30  ? 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  ? LIMA to LAD  ? Stroke [facility])   ? ? ?Past Surgical History:  ?Procedure Laterality Date  ? ABDOMINAL HYSTERECTOMY    ? Cancer and endometriosis  ? CARDIAC CATHETERIZATION  2008  ? CARDIAC  CATHETERIZATION N/A 10/29/2015  ? Procedure: Left Heart Cath and Coronary Angiography;  Surgeon: Belva Crome, MD;  Location: [place] [facility];  Service: Cardiovascular;  Laterality: N/A;  ? CATARACT EXTRACTION    ? CHOLECYSTECTOMY    ? CORONARY ARTERY BYPASS GRAFT N/A 11/02/2015  ? Procedure: OFF PUMP CORONARY ARTERY BYPASS GRAFTING (CABG) TIMES ONE;  Surgeon: Rexene Alberts, MD;  Location: [place];  Service: Open Heart Surgery;  Laterality: N/A;  LIMA to LAD  ? FEMUR FRACTURE SURGERY  09/2015  ? right hip compression screws   ? FRACTURE SURGERY    ? Right hip  ? HERNIA REPAIR    ? Right inguinal  ? TEE WITHOUT CARDIOVERSION N/A 11/02/2015  ? Procedure: TRANSESOPHAGEAL ECHOCARDIOGRAM (TEE);  Surgeon: Rexene Alberts, MD;  Location: [place];  Service: Open Heart Surgery;  Laterality: N/A;  ? ? ? ?Current Outpatient Medications  ?Medication Sig Dispense Refill  ? apixaban (ELIQUIS) 5 MG TABS tablet Take 1 tablet (5 mg total) by mouth 2 (two) times daily. 180 tablet 1  ? cholecalciferol (VITAMIN D3) 25 MCG (1000 UNIT) tablet Take 1,000 Units by mouth daily.    ? Cyanocobalamin (VITAMIN B-12 PO) Take 500 mcg by mouth. Take one tablet by mouth  on Mon.,Wed., and Friday    ? diltiazem (CARDIZEM) 30 MG tablet Take 1 tablet (30 mg total) by mouth 2 (two) times daily. 180 tablet 3  ? famotidine (PEPCID) 20 MG tablet Take 1 tablet (20 mg total) by mouth 2 (two) times daily. 180 tablet 3  ? hydrochlorothiazide (HYDRODIURIL) 25 MG tablet Take 1 tablet (25 mg total) by mouth daily. 90 tablet 3  ? levothyroxine (SYNTHROID) 50 MCG tablet Take 1 tablet (50 mcg total) by mouth daily before breakfast. 90 tablet 3  ? Multiple Vitamins-Minerals (PRESERVISION AREDS 2 PO) Take 1 capsule by mouth at bedtime.    ? QUEtiapine (SEROQUEL) 50 MG tablet Take 1 tablet (50 mg total) by mouth at bedtime. 90 tablet 3  ? acetaminophen (TYLENOL) 325 MG tablet Take 325 mg by mouth every 6 (six) hours as needed.    ? amoxicillin (AMOXIL) 500 MG capsule  Take 1 capsule (500 mg total) by mouth 2 (two) times daily. 20 capsule 0  ? benzonatate (TESSALON PERLES) 100 MG capsule Take 1 capsule (100 mg total) by mouth 3 (three) times daily as needed for cough. (Patient not taking: Reported on 02/15/2022) 20 capsule 0  ? bethanechol (URECHOLINE) 10 MG tablet Take 1 tablet (10 mg total) by mouth 3 (three) times daily. (Patient not taking: Reported on 02/15/2022) 270 tablet 3  ? meclizine (ANTIVERT) 12.5 MG tablet Take 1 tablet (12.5 mg total) by mouth 3 (three) times daily as needed for dizziness. (Patient not taking: Reported on 01/04/2022) 30 tablet 0  ? senna (SENOKOT) 8.6 MG TABS tablet Take 1 tablet by mouth.    ? ?Current Facility-Administered Medications  ?Medication Dose Route Frequency Provider Last Rate Last Admin  ? methylPREDNISolone acetate (DEPO-MEDROL) injection 80 mg  80 mg Intramuscular Once [name], [name], MD      ? ? ?Allergies:   Nitrofurantoin, Nsaids, Other, Flonase [fluticasone propionate], Prevnar 13 [pneumococcal 13-val conj vacc], Beta adrenergic blockers, Nortriptyline hcl, Potassium-containing compounds, Alendronate sodium, Ciprofloxacin, Codeine, Crestor [rosuvastatin calcium], Darvon, Lipitor [atorvastatin calcium], Pravachol, Propranolol hcl, Sulfa antibiotics, Sulfamethoxazole, Ultram [tramadol hcl], Welchol [colesevelam hcl], Zetia [ezetimibe], and Zocor [simvastatin]  ? ? ?ROS:  Please see the history of present illness.   Otherwise, review of systems are positive for none.   All other systems are reviewed and negative.  ? ? ?PHYSICAL EXAM: ?VS:  BP 106/80   Pulse (!) 138   Ht '5\' 4"'$  (1.626 m)   Wt 134 lb (60.8 kg)   LMP 03/21/1971   BMI 23.00 kg/m?  , BMI Body mass index is 23 kg/m?.  ?GENERAL:  Frail appearing ?NECK:  No jugular venous distention, waveform within normal limits, carotid upstroke brisk and symmetric, no bruits, no thyromegaly ?LUNGS:  Clear to auscultation bilaterally ?CHEST:  Unremarkable ?HEART:  PMI not displaced or  sustained,S1 and S2 within normal limits, no S3, no clicks, no rubs, no murmurs, irregular ?ABD:  Flat, positive bowel sounds normal in frequency in pitch, no bruits, no rebound, no guarding, no midline pulsatile mass, no hepatomegaly, no splenomegaly ?EXT:  2 plus pulses throughout, no edema, no cyanosis no clubbing ? ? ? ?EKG:  EKG is   ordered today. ?Atrial fibrillation with rapid ventricular response, low voltage in the limb leads, no acute ST-T wave changes ? ? ?Recent Labs: ?08/02/2021: Hemoglobin 13.9; Platelets 234 ?09/30/2021: ALT 9; BUN 15; Creatinine, Ser 1.05; Potassium 3.6; Sodium 141; TSH 2.780  ? ? ?Lipid Panel ?   ?Component Value Date/Time  ?  CHOL 261 (H) 08/23/2020 1215  ? TRIG 181 (H) 08/23/2020 1215  ? TRIG 162 (H) 06/24/2015 9798  ? HDL 50 08/23/2020 1215  ? HDL 44 06/24/2015 0918  ? CHOLHDL 5.2 (H) 08/23/2020 1215  ? CHOLHDL 5.4 10/31/2015 0215  ? VLDL 20 10/31/2015 0215  ? LDLCALC 177 (H) 08/23/2020 1215  ? LDLCALC 163 (H) 05/14/2014 1038  ? ?  ? ?Wt Readings from Last 3 Encounters:  ?02/15/22 134 lb (60.8 kg)  ?01/04/22 137 lb (62.1 kg)  ?12/08/21 137 lb (62.1 kg)  ?  ? ? ?Other studies Reviewed: ?Additional studies/ records that were reviewed today include:   None. ?Review of the above records demonstrates:    See elsewhere ? ? ?ASSESSMENT AND PLAN: ? ? ?CAD:   The patient has no new sypmtoms.  No further cardiovascular testing is indicated.  We will continue with aggressive risk reduction and meds as listed. ? ?ATRIAL FIB:    The patient has asymptomatic paroxysmal atrial fibrillation.   Today she is in fibrillation with a rapid rate.  I am going to stop her amlodipine and start her on Cardizem 30 mg immediate release twice daily.  She is tolerating anticoagulation.  She is on the appropriate dose although if she loses much weight we will have to go down on her dose.  I would like her to follow her heart rate with a pulse oximeter. ? ?DYSLIPIDEMIA:   She has been intolerant of statins and  does not want PCSK9 therapy and I think that is reasonable. ? ?HTN:  Her BP is actually running low and I did see her blood pressure diary.  I will stop her amlodipine as above and use Cardizem cautiously.  ? ?CVA:    She

## 2022-02-15 ENCOUNTER — Encounter: Payer: Self-pay | Admitting: Cardiology

## 2022-02-15 ENCOUNTER — Ambulatory Visit (INDEPENDENT_AMBULATORY_CARE_PROVIDER_SITE_OTHER): Payer: Medicare Other | Admitting: Cardiology

## 2022-02-15 ENCOUNTER — Other Ambulatory Visit: Payer: Self-pay

## 2022-02-15 VITALS — BP 106/80 | HR 138 | Ht 64.0 in | Wt 134.0 lb

## 2022-02-15 DIAGNOSIS — I10 Essential (primary) hypertension: Secondary | ICD-10-CM | POA: Diagnosis not present

## 2022-02-15 DIAGNOSIS — I480 Paroxysmal atrial fibrillation: Secondary | ICD-10-CM

## 2022-02-15 DIAGNOSIS — I2510 Atherosclerotic heart disease of native coronary artery without angina pectoris: Secondary | ICD-10-CM

## 2022-02-15 DIAGNOSIS — E785 Hyperlipidemia, unspecified: Secondary | ICD-10-CM | POA: Diagnosis not present

## 2022-02-15 MED ORDER — DILTIAZEM HCL 30 MG PO TABS: 30.0000 mg | ORAL_TABLET | Freq: Two times a day (BID) | ORAL | 3 refills | Status: DC

## 2022-02-15 NOTE — Patient Instructions (Signed)
Medication Instructions:  ?Please discontinue your Amlodipine. ?Start Cardizem 30 mg one tablet twice a day. ?Continue all other medications as listed. ? ?*If you need a refill on your cardiac medications before your next appointment, please call your pharmacy* ? ?Follow-Up: ?At [facility], you and your health needs are our priority.  As part of our continuing mission to provide you with exceptional heart care, we have created designated Provider Care Teams.  These Care Teams include your primary Cardiologist (physician) and Advanced Practice Providers (APPs -  Physician Assistants and Nurse Practitioners) who all work together to provide you with the care you need, when you need it. ? ?We recommend signing up for the patient portal called "MyChart".  Sign up information is provided on this After Visit Summary.  MyChart is used to connect with patients for Virtual Visits (Telemedicine).  Patients are able to view lab/test results, encounter notes, upcoming appointments, etc.  Non-urgent messages can be sent to your provider as well.   ?To learn more about what you can do with MyChart, go to [email or web address].   ? ?Your next appointment:   ?3 month(s) ? ?The format for your next appointment:   ?In Person ? ?Provider:   ?Minus Breeding, MD  ? ? ?Thank you for choosing [facility]!! ? ? ? ? ?Please follow your HR with pulse oximeter ? ? ?

## 2022-03-02 ENCOUNTER — Telehealth: Payer: Self-pay | Admitting: Cardiology

## 2022-03-02 NOTE — Telephone Encounter (Signed)
New Message: ? ? ? ? ?Please call, concerning her Oxy meter. ?

## 2022-03-02 NOTE — Telephone Encounter (Signed)
Pt states it will cost her $50 to get an oximeter. "The pharmacy said if he sends in a prescription they can get the insurance to pay for it." Will get message to Dr. Percival Spanish for approval.  ?

## 2022-03-03 ENCOUNTER — Other Ambulatory Visit: Payer: Self-pay

## 2022-03-03 MED ORDER — PULSE OXIMETER MISC: 1.0000 [IU] | Freq: Once | 0 refills | Status: AC

## 2022-03-03 NOTE — Progress Notes (Signed)
Prescription for pulse oximeter sent to pharmacy.  ?

## 2022-03-03 NOTE — Telephone Encounter (Signed)
Prescription for pulse oximeter sent to pharmacy.  ?

## 2022-03-03 NOTE — Telephone Encounter (Signed)
Called pt to let her know the prescription has been sent in. She will reach out to the pharmacy to get it filled.  ?

## 2022-03-15 ENCOUNTER — Encounter: Payer: Self-pay | Admitting: Family Medicine

## 2022-03-15 ENCOUNTER — Ambulatory Visit (INDEPENDENT_AMBULATORY_CARE_PROVIDER_SITE_OTHER): Payer: Medicare Other | Admitting: Family Medicine

## 2022-03-15 VITALS — BP 143/85 | HR 104 | Ht 64.0 in | Wt 137.0 lb

## 2022-03-15 DIAGNOSIS — I10 Essential (primary) hypertension: Secondary | ICD-10-CM

## 2022-03-15 DIAGNOSIS — Z23 Encounter for immunization: Secondary | ICD-10-CM | POA: Diagnosis not present

## 2022-03-15 DIAGNOSIS — E039 Hypothyroidism, unspecified: Secondary | ICD-10-CM

## 2022-03-15 DIAGNOSIS — E7800 Pure hypercholesterolemia, unspecified: Secondary | ICD-10-CM

## 2022-03-15 DIAGNOSIS — I480 Paroxysmal atrial fibrillation: Secondary | ICD-10-CM | POA: Diagnosis not present

## 2022-03-15 DIAGNOSIS — F4323 Adjustment disorder with mixed anxiety and depressed mood: Secondary | ICD-10-CM

## 2022-03-15 NOTE — Progress Notes (Signed)
? ?BP (!) 143/85   Pulse (!) 104   Ht $R'5\' 4"'od$  (1.626 m)   Wt 137 lb (62.1 kg)   LMP 03/21/1971   SpO2 99%   BMI 23.52 kg/m?   ? ?Subjective:  ? ?Patient ID: Emily Wagner, female    DOB: 1933/11/05, 88 y.o.   MRN: [id number] ? ?HPI: ?Emily Wagner is a 88 y.o. female presenting on 03/15/2022 for Medical Management of Chronic Issues, Hypertension, Anxiety, and Hypothyroidism ? ? ?HPI ?Hypertension ?Patient is currently on diltiazem and hydrochlorothiazide, and their blood pressure today is 143/85. Patient denies any lightheadedness or dizziness. Patient denies headaches, blurred vision, chest pains, shortness of breath, or weakness. Denies any side effects from medication and is content with current medication.  ? ?Hypothyroidism recheck ?Patient is coming in for thyroid recheck today as well. They deny any issues with hair changes or heat or cold problems or diarrhea or constipation. They deny any chest pain or palpitations. They are currently on levothyroxine 50 micrograms  ? ?Hyperlipidemia ?Patient is coming in for recheck of his hyperlipidemia. The patient is currently taking no medicine, has been intolerant of almost all cholesterol medicines including statins and Zetia. They deny any issues with myalgias or history of liver damage from it. They deny any focal numbness or weakness or chest pain.  ? ?Anxiety recheck ?Patient is coming in today for anxiety recheck.  Mainly she takes Seroquel at night to help with the symptoms.  She says she is doing well and feeling very well.  She is sleeping well. ? ?Patient sees cardiology for A-fib management ? ?Relevant past medical, surgical, family and social history reviewed and updated as indicated. Interim medical history since our last visit reviewed. ?Allergies and medications reviewed and updated. ? ?Review of Systems  ?Constitutional:  Negative for chills and fever.  ?Eyes:  Negative for visual disturbance.  ?Respiratory:  Negative for chest tightness and  shortness of breath.   ?Cardiovascular:  Negative for chest pain, palpitations and leg swelling.  ?Genitourinary:  Negative for difficulty urinating and dysuria.  ?Musculoskeletal:  Negative for back pain and gait problem.  ?Skin:  Negative for rash.  ?Neurological:  Negative for light-headedness and headaches.  ?Psychiatric/Behavioral:  Negative for agitation and behavioral problems.   ?All other systems reviewed and are negative. ? ?Per HPI unless specifically indicated above ? ? ?Allergies as of 03/15/2022   ? ?   Reactions  ? Nitrofurantoin Other (See Comments)  ? Liver failure  ? Nsaids Other (See Comments)  ? Kidney failure  ? Other Other (See Comments)  ? Most antibiotics require supervision per patient  ? Flonase [fluticasone Propionate] Other (See Comments)  ? headache  ? Prevnar 13 [pneumococcal 13-val Conj Vacc] Other (See Comments)  ? Nerve damage in left arm  ? Beta Adrenergic Blockers   ? Nortriptyline Hcl Other (See Comments)  ? States she felt like worms where crawling under her skin.  ? Potassium-containing Compounds   ? Rash and raw areas / sore breast   ? Alendronate Sodium Other (See Comments)  ? Ciprofloxacin Other (See Comments)  ? Patient denies allergy--"I take Cipro for bladder infections"  ? Codeine Other (See Comments)  ? Crestor [rosuvastatin Calcium] Other (See Comments)  ? Darvon Other (See Comments)  ? Lipitor [atorvastatin Calcium] Other (See Comments)  ? Pravachol Other (See Comments)  ? Propranolol Hcl Other (See Comments)  ? Unknown  ? Sulfa Antibiotics Other (See Comments)  ? Sulfamethoxazole Other (  See Comments)  ? Unknown  ? Ultram [tramadol Hcl] Itching  ? Welchol [colesevelam Hcl] Other (See Comments)  ? Zetia [ezetimibe] Other (See Comments)  ? Zocor [simvastatin] Other (See Comments)  ? ?  ? ?  ?Medication List  ?  ? ?  ? Accurate as of March 15, 2022  1:50 PM. If you have any questions, ask your nurse or doctor.  ?  ?  ? ?  ? ?STOP taking these medications   ? ?amoxicillin  500 MG capsule ?Commonly known as: AMOXIL ?Stopped by: Worthy Rancher, MD ?  ?benzonatate 100 MG capsule ?Commonly known as: Best boy ?Stopped by: Worthy Rancher, MD ?  ? ?  ? ?TAKE these medications   ? ?acetaminophen 325 MG tablet ?Commonly known as: TYLENOL ?Take 325 mg by mouth every 6 (six) hours as needed. ?  ?apixaban 5 MG Tabs tablet ?Commonly known as: ELIQUIS ?Take 1 tablet (5 mg total) by mouth 2 (two) times daily. ?  ?bethanechol 10 MG tablet ?Commonly known as: URECHOLINE ?Take 1 tablet (10 mg total) by mouth 3 (three) times daily. ?  ?cholecalciferol 25 MCG (1000 UNIT) tablet ?Commonly known as: VITAMIN D3 ?Take 1,000 Units by mouth daily. ?  ?diltiazem 30 MG tablet ?Commonly known as: Cardizem ?Take 1 tablet (30 mg total) by mouth 2 (two) times daily. ?  ?famotidine 20 MG tablet ?Commonly known as: PEPCID ?Take 1 tablet (20 mg total) by mouth 2 (two) times daily. ?  ?hydrochlorothiazide 25 MG tablet ?Commonly known as: HYDRODIURIL ?Take 1 tablet (25 mg total) by mouth daily. ?  ?levothyroxine 50 MCG tablet ?Commonly known as: SYNTHROID ?Take 1 tablet (50 mcg total) by mouth daily before breakfast. ?  ?meclizine 12.5 MG tablet ?Commonly known as: ANTIVERT ?Take 1 tablet (12.5 mg total) by mouth 3 (three) times daily as needed for dizziness. ?  ?PRESERVISION AREDS 2 PO ?Take 1 capsule by mouth at bedtime. ?  ?QUEtiapine 50 MG tablet ?Commonly known as: SEROQUEL ?Take 1 tablet (50 mg total) by mouth at bedtime. ?  ?senna 8.6 MG Tabs tablet ?Commonly known as: SENOKOT ?Take 1 tablet by mouth. ?  ?VITAMIN B-12 PO ?Take 500 mcg by mouth. Take one tablet by mouth on Mon.,Wed., and Friday ?  ? ?  ? ? ? ?Objective:  ? ?BP (!) 143/85   Pulse (!) 104   Ht $R'5\' 4"'CI$  (1.626 m)   Wt 137 lb (62.1 kg)   LMP 03/21/1971   SpO2 99%   BMI 23.52 kg/m?   ?Wt Readings from Last 3 Encounters:  ?03/15/22 137 lb (62.1 kg)  ?02/15/22 134 lb (60.8 kg)  ?01/04/22 137 lb (62.1 kg)  ?  ?Physical Exam ?Vitals and  nursing note reviewed.  ?Constitutional:   ?   General: She is not in acute distress. ?   Appearance: She is well-developed. She is not diaphoretic.  ?Eyes:  ?   Conjunctiva/sclera: Conjunctivae normal.  ?Cardiovascular:  ?   Rate and Rhythm: Normal rate. Rhythm irregular.  ?   Heart sounds: Normal heart sounds. No murmur heard. ?Pulmonary:  ?   Effort: Pulmonary effort is normal. No respiratory distress.  ?   Breath sounds: Normal breath sounds. No wheezing.  ?Musculoskeletal:     ?   General: No swelling or tenderness. Normal range of motion.  ?Skin: ?   General: Skin is warm and dry.  ?   Findings: No rash.  ?Neurological:  ?   Mental Status: She  is alert and oriented to person, place, and time.  ?   Coordination: Coordination normal.  ?Psychiatric:     ?   Behavior: Behavior normal.  ? ? ? ? ?Assessment & Plan:  ? ?Problem List Items Addressed This Visit   ? ?  ? Cardiovascular and Mediastinum  ? Essential hypertension - Primary  ? Relevant Orders  ? CBC with Differential/Platelet  ? CMP14+EGFR  ? Lipid panel  ? TSH  ? Atrial fibrillation ([place])  ?  ? Endocrine  ? Acquired hypothyroidism  ? Relevant Orders  ? CBC with Differential/Platelet  ? CMP14+EGFR  ? Lipid panel  ? TSH  ?  ? Other  ? HYPERCHOLESTEROLEMIA  ? Adjustment disorder with mixed anxiety and depressed mood  ? ?Other Visit Diagnoses   ? ? Need for shingles vaccine      ? Relevant Orders  ? Varicella-zoster vaccine IM (Shingrix) (Completed)  ? ?  ?  ?We will do blood work today, continue current medicine, follow-up in 3 months. ? ?Continues to follow with cardiology, they did just adjust her diltiazem the patient says she is asymptomatic for it ?Follow up plan: ?Return in about 3 months (around 06/14/2022), or if symptoms worsen or fail to improve, for Thyroid and hypertension and A-fib and cholesterol. ? ?Counseling provided for all of the vaccine components ?Orders Placed This Encounter  ?Procedures  ? Varicella-zoster vaccine IM (Shingrix)  ? CBC  with Differential/Platelet  ? CMP14+EGFR  ? Lipid panel  ? TSH  ? ? ?Caryl Pina, MD ?[place] ?03/15/2022, 1:50 PM ? ? ? ? ?

## 2022-03-16 LAB — CMP14+EGFR
ALT: 6 IU/L (ref 0–32)
AST: 20 IU/L (ref 0–40)
Albumin/Globulin Ratio: 1.3 (ref 1.2–2.2)
Albumin: 3.9 g/dL (ref 3.6–4.6)
Alkaline Phosphatase: 97 IU/L (ref 44–121)
BUN/Creatinine Ratio: 15 (ref 12–28)
BUN: 19 mg/dL (ref 8–27)
Bilirubin Total: 0.4 mg/dL (ref 0.0–1.2)
CO2: 23 mmol/L (ref 20–29)
Calcium: 9.3 mg/dL (ref 8.7–10.3)
Chloride: 101 mmol/L (ref 96–106)
Creatinine, Ser: 1.3 mg/dL — ABNORMAL HIGH (ref 0.57–1.00)
Globulin, Total: 2.9 g/dL (ref 1.5–4.5)
Glucose: 91 mg/dL (ref 70–99)
Potassium: 3.8 mmol/L (ref 3.5–5.2)
Sodium: 142 mmol/L (ref 134–144)
Total Protein: 6.8 g/dL (ref 6.0–8.5)
eGFR: 40 mL/min/{1.73_m2} — ABNORMAL LOW (ref >59)

## 2022-03-16 LAB — CBC WITH DIFFERENTIAL/PLATELET
Basophils Absolute: 0.1 10*3/uL (ref 0.0–0.2)
Basos: 1 %
EOS (ABSOLUTE): 0.2 10*3/uL (ref 0.0–0.4)
Eos: 5 %
Hematocrit: 35.1 % (ref 34.0–46.6)
Hemoglobin: 11.4 g/dL (ref 11.1–15.9)
Immature Grans (Abs): 0 10*3/uL (ref 0.0–0.1)
Immature Granulocytes: 0 %
Lymphocytes Absolute: 1.3 10*3/uL (ref 0.7–3.1)
Lymphs: 27 %
MCH: 29.2 pg (ref 26.6–33.0)
MCHC: 32.5 g/dL (ref 31.5–35.7)
MCV: 90 fL (ref 79–97)
Monocytes Absolute: 0.4 10*3/uL (ref 0.1–0.9)
Monocytes: 8 %
Neutrophils Absolute: 2.9 10*3/uL (ref 1.4–7.0)
Neutrophils: 59 %
Platelets: 252 10*3/uL (ref 150–450)
RBC: 3.91 x10E6/uL (ref 3.77–5.28)
RDW: 12.6 % (ref 11.7–15.4)
WBC: 5 10*3/uL (ref 3.4–10.8)

## 2022-03-16 LAB — LIPID PANEL
Chol/HDL Ratio: 4.9 ratio — ABNORMAL HIGH (ref 0.0–4.4)
Cholesterol, Total: 215 mg/dL — ABNORMAL HIGH (ref 100–199)
HDL: 44 mg/dL (ref >39)
LDL Chol Calc (NIH): 152 mg/dL — ABNORMAL HIGH (ref 0–99)
Triglycerides: 106 mg/dL (ref 0–149)
VLDL Cholesterol Cal: 19 mg/dL (ref 5–40)

## 2022-03-16 LAB — TSH: TSH: 1.99 u[IU]/mL (ref 0.450–4.500)

## 2022-03-21 ENCOUNTER — Telehealth: Payer: Self-pay | Admitting: Family Medicine

## 2022-03-21 DIAGNOSIS — I10 Essential (primary) hypertension: Secondary | ICD-10-CM

## 2022-03-21 DIAGNOSIS — N3010 Interstitial cystitis (chronic) without hematuria: Secondary | ICD-10-CM

## 2022-03-21 DIAGNOSIS — F4323 Adjustment disorder with mixed anxiety and depressed mood: Secondary | ICD-10-CM

## 2022-03-21 MED ORDER — LEVOTHYROXINE SODIUM 50 MCG PO TABS: 50.0000 ug | ORAL_TABLET | Freq: Every day | ORAL | 0 refills | Status: DC

## 2022-03-21 MED ORDER — QUETIAPINE FUMARATE 50 MG PO TABS: 50.0000 mg | ORAL_TABLET | Freq: Every day | ORAL | 0 refills | Status: DC

## 2022-03-21 MED ORDER — LEVOTHYROXINE SODIUM 50 MCG PO TABS: 50.0000 ug | ORAL_TABLET | Freq: Every day | ORAL | 1 refills | Status: DC

## 2022-03-21 MED ORDER — HYDROCHLOROTHIAZIDE 25 MG PO TABS: 25.0000 mg | ORAL_TABLET | Freq: Every day | ORAL | 1 refills | Status: DC

## 2022-03-21 MED ORDER — FAMOTIDINE 20 MG PO TABS: 20.0000 mg | ORAL_TABLET | Freq: Two times a day (BID) | ORAL | 1 refills | Status: DC

## 2022-03-21 MED ORDER — FAMOTIDINE 20 MG PO TABS: 20.0000 mg | ORAL_TABLET | Freq: Two times a day (BID) | ORAL | 0 refills | Status: DC

## 2022-03-21 MED ORDER — HYDROCHLOROTHIAZIDE 25 MG PO TABS: 25.0000 mg | ORAL_TABLET | Freq: Every day | ORAL | 0 refills | Status: DC

## 2022-03-21 MED ORDER — QUETIAPINE FUMARATE 50 MG PO TABS: 50.0000 mg | ORAL_TABLET | Freq: Every day | ORAL | 1 refills | Status: DC

## 2022-03-21 NOTE — Telephone Encounter (Signed)
Pt had 90d refills w/ [facility] still, pt has had issues with them and does not want to use them anymore, sending rest of her refills from last Sept to [facility] ?

## 2022-03-21 NOTE — Addendum Note (Signed)
Addended by: Antonietta Barcelona D on: 03/21/2022 04:06 PM ? ? Modules accepted: Orders ? ?

## 2022-03-21 NOTE — Telephone Encounter (Signed)
?  Prescription Request ? ?03/21/2022 ? ?What is the name of the medication or equipment? ALL ? ?Have you contacted your pharmacy to request a refill? YES ? ?Which pharmacy would you like this sent to? [facility] ? ?Pt had her last visit with PCP on 03/15/22 and no refills were called in for her. ? ? ?  ?  ?

## 2022-03-21 NOTE — Telephone Encounter (Signed)
Pt aware [facility] day refill sent to [facility] pharmacy ?

## 2022-03-27 ENCOUNTER — Ambulatory Visit: Payer: Medicare Other | Admitting: Adult Health

## 2022-03-27 ENCOUNTER — Encounter: Payer: Self-pay | Admitting: Adult Health

## 2022-03-27 VITALS — BP 122/67 | HR 61 | Ht 64.0 in | Wt 136.0 lb

## 2022-03-27 DIAGNOSIS — I63411 Cerebral infarction due to embolism of right middle cerebral artery: Secondary | ICD-10-CM

## 2022-03-27 NOTE — Progress Notes (Signed)
?[facility] ?[address] ?[place]. [place] [address] ?[phone] ? ?     OFFICE FOLLOW UP NOTE ? ?[name] ?Date of Birth:  1933/04/08 ?Medical Record Number:  [id number]  ? ?Referring MD: Emily Wagner ? ?Primary neurologist: Emily Wagner ?Reason for Referral: Stroke ? ? ?Chief Complaint  ?Patient presents with  ? Follow-up  ?  RM 2 with daughter Emily Wagner ?Pt is well and stable, no new stroke concerns   ?  ? ? ? ?HPI:  ? ?Update 03/27/2022 JM: 88 year old female with history of right MCA stroke in 03/2021 who returns today for 23-monthfollow-up visit. She is accompanied by her daughter, TCoralyn Wagner  She has been doing well since prior visit without any new or reoccurring stroke/TIA symptoms.  Cognition stable since prior visit.  Has since returned back to driving without difficulty short distance only.  Continues to maintain ADLs and IADLs independently.  Continued use of RW or cane at all times (baseline with LLE weakness 2/2 polio), no recent falls.  Compliant on Eliquis, denies side effects.  Blood pressure today 122/67. She does c/o LE proximal pain from hx of polio, she questions use of pain medication that is not addictive. She has not previously been seen by pain management as she has multiple medication allergies limiting treatment options. Currently using Voltaren gel with some benefit. Cold and rainy weather worsens pain.  No new concerns at this time.  ? ? ? ? ?History provided for reference purposes only ?Update 09/20/2021 JM: Returns for sooner requested visit.  She is requesting a memory evaluation in order to return to driving. She is accompanied by her daughter. ? ?Shortly after her stroke, she was advised no driving by PCP Dr. DWarrick Parisiandue to memory concerns and confusion.  She recently [facility] after working with PT/OT/SLP for 3 month duration and reports making excellent recovery currently at baseline functioning.  She continues to live alone. She is able to  maintain all ADLs and majority of IADLs except for driving. She has since returned back to managing her finances and bill paying as well as medications. Continues use of RW which is baseline for hx of polio with mild LLE weakness. Prior to stroke, only driving short distance. She would refrain from nighttime driving and avoid busier roads or highways. ? ?MMSE today 29/30 (missed 1 recall word) ? ?Compliant on Eliquis without side effects.  Blood pressure today 132/76.  ? ?Denies new stroke/TIA symptoms. No further concerns at this time. ? ? ?Initial visit 07/18/2021 Dr. SLeonie Man [name] a pleasant 88year old Caucasian lady seen today for initial office consultation visit for stroke.  History is obtained from the patient and her daughter is accompanying her as well as review of electronic medical records in epic as well as care everywhere.  Pertinent imaging films were also reviewed.  She has past medical history of coronary artery disease, hypertension, hyperlipidemia, osteopenia who presented on 03/27/2021 with being found by family on the floor near her recliner at home.  Her last known well was at 1930 hrs. on 03/26/2021.  She lives alone but is independent of most actives of daily living and does walk with a walker.  On EMS arrival she had right gaze deviation with left facial droop and left hemiparesis.  CT scan showed right temporal, insular and basal ganglia hypodensities and CT angiogram of the head and neck showed occlusion of the right internal carotid artery just beyond the bulb extending to  the supraclinoid portion and CT perfusion scan showed a large penumbra.  Patient's family consented for mechanical thrombectomy but since there was another patient on the table at [facility] she had to be emergently  transferred to [facility] using interventional radiology transfer line after discussing the case with Dr. Arizona Constable who accepted the patient for emergent transfer and performed emergent  thrombectomy resulting in TICI 2C final revascularization.  Review of records at [facility] in [place].  Initial NIH stroke scale on admission was 14.  MRI scan showed moderate sized acute infarct involving right basal ganglia, insula and right temporoparietal lobes with tiny acute infarct in the right cerebellum with petechial hemorrhage but no frank hematoma.  Transthoracic echo showed left ventricular ejection fraction of 60 to 65%.  Hemoglobin A1c is 5.3.  Lipid profile was 141.  Patient was transferred to inpatient rehab at [facility] skilled nursing facility for 2 and half weeks and subsequently recovered to go back home and live independently.  Her NIH stroke scale was improved to 2 at discharge.  Patient is able to walk with a walker.  She states that most of her strength has improved but her stamina has not she gets tired more easily particularly when she is walking for a while and has to rest.  She is no longer able to do finances as it has been some cognitive deterioration noted and the daughter has taken that over.  Patient was subsequently found to have paroxysmal A. fib on a 30-day heart monitor and now has been switched to Eliquis.  She does complain of some minor bruising but no bleeding.  Blood pressure is well controlled today it is 129/78.  He has not had any recurrent stroke or TIA symptoms.   ? ? ? ?ROS:   ?14 system review of systems is positive for those listed in HPI and all other systems negative ? ?PMH:  ?Past Medical History:  ?Diagnosis Date  ? Adjustment disorder with mixed anxiety and depressed mood   ? Atrial fibrillation ([place]) 11/04/2015  ? Post-operative  ? Benign hypertensive heart disease without heart failure   ? CAD (coronary artery disease)   ? Infection of urinary tract 10/31/2015  ? Interstitial cystitis   ? Osteopenia   ? Other and unspecified hyperlipidemia   ? Other dyspnea and respiratory abnormality   ? Pelvic fracture ([place]) 05/30/2015  ? Post-polio  syndrome   ? polio age 58  ? 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  ? LIMA to LAD  ? Stroke [facility])   ? ? ?Social History:  ?Social History  ? ?Socioeconomic History  ? Marital status: Widowed  ?  Spouse name: Not on file  ? Number of children: 2  ? Years of education: Not on file  ? Highest education level: High school graduate  ?Occupational History  ? Occupation: retired  ?  Employer: [employer]  ?  Comment: admin. assistant  ?Tobacco Use  ? Smoking status: Never  ? Smokeless tobacco: Never  ?Vaping Use  ? Vaping Use: Never used  ?Substance and Sexual Activity  ? Alcohol use: No  ? Drug use: No  ? Sexual activity: Not on file  ?Other Topics Concern  ? Not on file  ?Social History Narrative  ? 01/04/22 lives alone  ? Daughter and granddaughter live close by and visit daily  ? Right Handed  ? Drinks no caffeine daily  ? ?Social Determinants of Health  ? ?Financial Resource Strain: High  Risk  ? Difficulty of Paying Living Expenses: Very hard  ?Food Insecurity: Food Insecurity Present  ? Worried About Charity fundraiser in the Last Year: Often true  ? Ran Out of Food in the Last Year: Often true  ?Transportation Needs: No Transportation Needs  ? Lack of Transportation (Medical): No  ? Lack of Transportation (Non-Medical): No  ?Physical Activity: Insufficiently Active  ? Days of Exercise per Week: 7 days  ? Minutes of Exercise per Session: 10 min  ?Stress: No Stress Concern Present  ? Feeling of Stress : Not at all  ?Social Connections: Moderately Integrated  ? Frequency of Communication with Friends and Family: More than three times a week  ? Frequency of Social Gatherings with Friends and Family: More than three times a week  ? Attends Religious Services: More than 4 times per year  ? Active Member of Clubs or Organizations: Yes  ? Attends Archivist Meetings: More than 4 times per year  ? Marital Status: Widowed  ?Intimate Partner Violence: Not At Risk  ? Fear of Current or Ex-Partner: No  ? Emotionally Abused: No   ? Physically Abused: No  ? Sexually Abused: No  ? ? ?Medications:   ?Current Outpatient Medications on File Prior to Visit  ?Medication Sig Dispense Refill  ? acetaminophen (TYLENOL) 325 MG tablet Take 325 mg by

## 2022-03-27 NOTE — Patient Instructions (Signed)
Continue Eliquis (apixaban) twice daily for secondary stroke prevention ? ?Continue to follow up with PCP regarding cholesterol and blood pressure management  ?Maintain strict control of hypertension with blood pressure goal below 130/90 and cholesterol with LDL cholesterol (bad cholesterol) goal below 70 mg/dL.  ? ?Signs of a Stroke? Follow the BEFAST method:  ?Balance Watch for a sudden loss of balance, trouble with coordination or vertigo ?Eyes Is there a sudden loss of vision in one or both eyes? Or double vision?  ?Face: Ask the person to smile. Does one side of the face droop or is it numb?  ?Arms: Ask the person to raise both arms. Does one arm drift downward? Is there weakness or numbness of a leg? ?Speech: Ask the person to repeat a simple phrase. Does the speech sound slurred/strange? Is the person confused ? ?Time: If you observe any of these signs, call 911. ? ? ? ? ? ? ? ? ?Thank you for coming to see [name] at [facility]. I hope we have been able to provide you high quality care today. ? ?You may receive a patient satisfaction survey over the next few weeks. We would appreciate your feedback and comments so that we may continue to improve ourselves and the health of our patients. ? ?

## 2022-03-29 ENCOUNTER — Telehealth: Payer: Self-pay | Admitting: Cardiology

## 2022-03-29 NOTE — Telephone Encounter (Signed)
Ok to use, does not interact with her Eliquis. ?

## 2022-03-29 NOTE — Telephone Encounter (Signed)
Pt c/o medication issue: ? ?1. Name of Medication: Eliquis ? ?2. How are you currently taking this medication (dosage and times per day)? 2 times a day ? ?3. Are you having a reaction (difficulty breathing--STAT)?  ? ?4. What is your medication issue? If she use Asper Cream will that interfere with the Eliquis? ?

## 2022-03-29 NOTE — Telephone Encounter (Signed)
Called patient, advised of message from [facility].  ?Thanks! ?Patient verbalized understanding. ? ?

## 2022-04-11 DIAGNOSIS — B351 Tinea unguium: Secondary | ICD-10-CM | POA: Diagnosis not present

## 2022-04-11 DIAGNOSIS — M79676 Pain in unspecified toe(s): Secondary | ICD-10-CM | POA: Diagnosis not present

## 2022-05-22 NOTE — Progress Notes (Signed)
Cardiology Office Note   Date:  05/24/2022   ID:  Emily Wagner, DOB [birth year], MRN [id number]  PCP:  [name], [name], MD  Cardiologist:   None   Chief Complaint  Patient presents with   Atrial Fibrillation        History of Present Illness: Emily Wagner is a 88 y.o. female who presents for evaluation of atrial fib.     She has a history of  bypass surgery.  She has had fluctuating BPs.  She has a diagnosis of post polio syndrome.   She has chronic fatigue.   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  She was hospitalized May of last year for this.    She returns for follow-up.  She brings her heart rate recordings and she is oftentimes above a heart rate of 100 not infrequently in the 120s to 130s.  She said this can last for a few minutes to many minutes.  She does have some rates in the 60s and 70s but I do not see any bradycardia arrhythmias.  She has not had any presyncope or syncope.  She does feel the palpitations.  Her biggest issue is that she is having just chronic leg pain from her previous polio.  She is tearful today because of this.  She is not having any chest pressure, neck or arm discomfort.  She walks slowly with a cane.   Past Medical History:  Diagnosis Date   Adjustment disorder with mixed anxiety and depressed mood    Atrial fibrillation ([place]) 11/04/2015   Post-operative   Benign hypertensive heart disease without heart failure    CAD (coronary artery disease)    Infection of urinary tract 10/31/2015   Interstitial cystitis    Osteopenia    Other and unspecified hyperlipidemia    Other dyspnea and respiratory abnormality    Pelvic fracture ([facility]) 05/30/2015   Post-polio syndrome    polio age 43   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   LIMA to LAD   Stroke [facility])     Past Surgical History:  Procedure Laterality Date   ABDOMINAL HYSTERECTOMY     Cancer and endometriosis   CARDIAC CATHETERIZATION  2008   CARDIAC CATHETERIZATION N/A  10/29/2015   Procedure: Left Heart Cath and Coronary Angiography;  Surgeon: Belva Crome, MD;  Location: [facility];  Service: Cardiovascular;  Laterality: N/A;   CATARACT EXTRACTION     CHOLECYSTECTOMY     CORONARY ARTERY BYPASS GRAFT N/A 11/02/2015   Procedure: OFF PUMP CORONARY ARTERY BYPASS GRAFTING (CABG) TIMES ONE;  Surgeon: Rexene Alberts, MD;  Location: [facility];  Service: Open Heart Surgery;  Laterality: N/A;  LIMA to LAD   FEMUR FRACTURE SURGERY  09/2015   right hip compression screws    FRACTURE SURGERY     Right hip   HERNIA REPAIR     Right inguinal   TEE WITHOUT CARDIOVERSION N/A 11/02/2015   Procedure: TRANSESOPHAGEAL ECHOCARDIOGRAM (TEE);  Surgeon: Rexene Alberts, MD;  Location: [facility];  Service: Open Heart Surgery;  Laterality: N/A;     Current Outpatient Medications  Medication Sig Dispense Refill   acetaminophen (TYLENOL) 325 MG tablet Take 325 mg by mouth every 6 (six) hours as needed.     apixaban (ELIQUIS) 5 MG TABS tablet Take 1 tablet (5 mg total) by mouth 2 (two) times daily. 180 tablet 1   cholecalciferol (VITAMIN D3) 25 MCG (  1000 UNIT) tablet Take 1,000 Units by mouth daily.     Cyanocobalamin (VITAMIN B-12 PO) Take 500 mcg by mouth. Take one tablet by mouth on Mon.,Wed., and Friday     digoxin (LANOXIN) 0.125 MG tablet Take 1 tablet (0.125 mg total) by mouth daily. 90 tablet 3   diltiazem (CARDIZEM) 30 MG tablet Take 1 tablet (30 mg total) by mouth 2 (two) times daily. 180 tablet 3   famotidine (PEPCID) 20 MG tablet Take 1 tablet (20 mg total) by mouth 2 (two) times daily. 180 tablet 1   hydrochlorothiazide (HYDRODIURIL) 25 MG tablet Take 1 tablet (25 mg total) by mouth daily. 90 tablet 1   levothyroxine (SYNTHROID) 50 MCG tablet Take 1 tablet (50 mcg total) by mouth daily before breakfast. 90 tablet 1   meclizine (ANTIVERT) 12.5 MG tablet Take 1 tablet (12.5 mg total) by mouth 3 (three) times daily as needed for dizziness. (Patient taking differently: Take  12.5 mg by mouth as needed for dizziness.) 30 tablet 0   Multiple Vitamins-Minerals (PRESERVISION AREDS 2 PO) Take 1 capsule by mouth at bedtime.     QUEtiapine (SEROQUEL) 50 MG tablet Take 1 tablet (50 mg total) by mouth at bedtime. 90 tablet 1   Current Facility-Administered Medications  Medication Dose Route Frequency Provider Last Rate Last Admin   methylPREDNISolone acetate (DEPO-MEDROL) injection 80 mg  80 mg Intramuscular Once [name], [name], MD        Allergies:   Nitrofurantoin, Nsaids, Other, Flonase [fluticasone propionate], Prevnar 13 [pneumococcal 13-val conj vacc], Alendronate sodium, Beta adrenergic blockers, Ciprofloxacin, Codeine, Crestor [rosuvastatin calcium], Darvon, Lipitor [atorvastatin calcium], Nortriptyline hcl, Potassium-containing compounds, Pravachol, Propranolol hcl, Sulfa antibiotics, Sulfamethoxazole, Ultram [tramadol hcl], Welchol [colesevelam hcl], Zetia [ezetimibe], and Zocor [simvastatin]    ROS:  Please see the history of present illness.   Otherwise, review of systems are positive for none.   All other systems are reviewed and negative.    PHYSICAL EXAM: VS:  BP 124/70   Pulse (!) 109   Ht '5\' 4"'$  (1.626 m)   Wt 136 lb (61.7 kg)   LMP 03/21/1971   SpO2 97%   BMI 23.34 kg/m  , BMI Body mass index is 23.34 kg/m.  GENERAL:  Well appearing NECK:  No jugular venous distention, waveform within normal limits, carotid upstroke brisk and symmetric, no bruits, no thyromegaly LUNGS:  Clear to auscultation bilaterally CHEST:  Unremarkable HEART:  PMI not displaced or sustained,S1 and S2 within normal limits, no S3, no clicks, no rubs, no murmurs, irregular  ABD:  Flat, positive bowel sounds normal in frequency in pitch, no bruits, no rebound, no guarding, no midline pulsatile mass, no hepatomegaly, no splenomegaly EXT:  2 plus pulses throughout, no edema, no cyanosis no clubbing   EKG:  EKG is not ordered today.    Recent Labs: 03/15/2022: ALT 6; BUN  19; Creatinine, Ser 1.30; Hemoglobin 11.4; Platelets 252; Potassium 3.8; Sodium 142; TSH 1.990    Lipid Panel    Component Value Date/Time   CHOL 215 (H) 03/15/2022 1429   TRIG 106 03/15/2022 1429   TRIG 162 (H) 06/24/2015 0918   HDL 44 03/15/2022 1429   HDL 44 06/24/2015 0918   CHOLHDL 4.9 (H) 03/15/2022 1429   CHOLHDL 5.4 10/31/2015 0215   VLDL 20 10/31/2015 0215   LDLCALC 152 (H) 03/15/2022 1429   LDLCALC 163 (H) 05/14/2014 1038      Wt Readings from Last 3 Encounters:  05/24/22 136 lb (61.7 kg)  03/27/22 136 lb (61.7 kg)  03/15/22 137 lb (62.1 kg)      Other studies Reviewed: Additional studies/ records that were reviewed today include:   Heart rate diary. Review of the above records demonstrates:    See elsewhere  ASSESSMENT AND PLAN:   CAD: The patient has no new sypmtoms.  No further cardiovascular testing is indicated.  We will continue with aggressive risk reduction and meds as listed.  ATRIAL FIB:    Her heart rate is too high.  We talked about taking the Cardizem occasionally extra dose if she is having sustained tachyarrhythmias.  I am also going to put her on a very low-dose of digoxin 0.125 mg daily and I will check a digoxin level in about 10 days.  I will check a creatinine.  She does have some mild renal insufficiency.  She will continue to keep a blood pressure diary.  DYSLIPIDEMIA:   She has been intolerant of statins and does not want other therapy.  HTN:  Her BP is running slightly low and I did discontinue amlodipine previously.  She has not had any presyncope or syncope.  No change in therapy.   Current medicines are reviewed at length with the patient today.  The patient does not have concerns regarding medicines.  The following changes have been made: As above  Labs/ tests ordered today include:       Orders Placed This Encounter  Procedures   Digoxin level   Basic metabolic panel     Disposition:   FU with me in 4  months   Signed, Minus Breeding, MD  05/24/2022 10:26 AM    [facility]

## 2022-05-24 ENCOUNTER — Other Ambulatory Visit: Payer: Self-pay | Admitting: *Deleted

## 2022-05-24 ENCOUNTER — Ambulatory Visit: Payer: Medicare Other | Admitting: Cardiology

## 2022-05-24 ENCOUNTER — Encounter: Payer: Self-pay | Admitting: Cardiology

## 2022-05-24 VITALS — BP 124/70 | HR 109 | Ht 64.0 in | Wt 136.0 lb

## 2022-05-24 DIAGNOSIS — Z79899 Other long term (current) drug therapy: Secondary | ICD-10-CM

## 2022-05-24 DIAGNOSIS — I480 Paroxysmal atrial fibrillation: Secondary | ICD-10-CM | POA: Diagnosis not present

## 2022-05-24 DIAGNOSIS — I639 Cerebral infarction, unspecified: Secondary | ICD-10-CM | POA: Diagnosis not present

## 2022-05-24 DIAGNOSIS — I2510 Atherosclerotic heart disease of native coronary artery without angina pectoris: Secondary | ICD-10-CM

## 2022-05-24 DIAGNOSIS — E785 Hyperlipidemia, unspecified: Secondary | ICD-10-CM

## 2022-05-24 MED ORDER — DIGOXIN 125 MCG PO TABS: 0.1250 mg | ORAL_TABLET | Freq: Every day | ORAL | 3 refills | Status: DC

## 2022-05-24 NOTE — Patient Instructions (Signed)
Medication Instructions:  Please start Digoxin 0.125 mg once a day. Continue all other medications as listed.  *If you need a refill on your cardiac medications before your next appointment, please call your pharmacy*  Lab Work: Please have blood work at [facility] on Monday 06/05/2022.  Do not take your Digoxin before having this blood work.  If you have labs (blood work) drawn today and your tests are completely normal, you will receive your results only by: [place] (if you have MyChart) OR A paper copy in the mail If you have any lab test that is abnormal or we need to change your treatment, we will call you to review the results.  Follow-Up: At [facility], you and your health needs are our priority.  As part of our continuing mission to provide you with exceptional heart care, we have created designated Provider Care Teams.  These Care Teams include your primary Cardiologist (physician) and Advanced Practice Providers (APPs -  Physician Assistants and Nurse Practitioners) who all work together to provide you with the care you need, when you need it.  We recommend signing up for the patient portal called "MyChart".  Sign up information is provided on this After Visit Summary.  MyChart is used to connect with patients for Virtual Visits (Telemedicine).  Patients are able to view lab/test results, encounter notes, upcoming appointments, etc.  Non-urgent messages can be sent to your provider as well.   To learn more about what you can do with MyChart, go to [email or web address].    Your next appointment:   4 month(s)  The format for your next appointment:   In Person  Provider:   Minus Breeding, MD{   Important Information About Sugar

## 2022-05-29 ENCOUNTER — Telehealth: Payer: Self-pay | Admitting: Cardiology

## 2022-05-29 NOTE — Telephone Encounter (Signed)
Pt c/o BP issue: STAT if pt c/o blurred vision, one-sided weakness or slurred speech  1. What are your last 5 BP readings? 86/59; 98/61  2. Are you having any other symptoms (ex. Dizziness, headache, blurred vision, passed out)? Really weak  3. What is your BP issue? Experiencing low BP

## 2022-05-29 NOTE — Telephone Encounter (Signed)
Patient has been made aware to get the labwork completed. She will go to a [facility] or her PCP.  She has been advised that she can call our on call tonight or tomorrow if needed.   Minus Breeding, MD  You 9 minutes ago (4:25 PM)    She was supposed to get a digoxin level and it doesn't look like she did this.  It has to be at least 5 hours after the dose.    Dig does not make the BP go down.  Should help the heart rate

## 2022-05-29 NOTE — Telephone Encounter (Signed)
Returned the call to the patient. She stated that since starting the digoxin on 6/29, her blood pressure has started to drop. An hour after taking her digoxin, her blood pressure this morning was 86/59. She stated that her heart rate has been in the 60's-80's. She stated she has been fatigued and weak but this is not a new problem for her. It has just gotten worse lately.  While on the phone her blood pressure was 121/69 and heart rate was 115.   She currently takes: Diltiazem 30 mg bid HCTZ 25 mg once daily Digoxin 0.125 mg once daily

## 2022-05-31 ENCOUNTER — Other Ambulatory Visit: Payer: Medicare Other

## 2022-05-31 DIAGNOSIS — Z79899 Other long term (current) drug therapy: Secondary | ICD-10-CM | POA: Diagnosis not present

## 2022-05-31 DIAGNOSIS — I2510 Atherosclerotic heart disease of native coronary artery without angina pectoris: Secondary | ICD-10-CM | POA: Diagnosis not present

## 2022-05-31 DIAGNOSIS — I480 Paroxysmal atrial fibrillation: Secondary | ICD-10-CM | POA: Diagnosis not present

## 2022-06-01 LAB — BASIC METABOLIC PANEL
BUN/Creatinine Ratio: 18 (ref 12–28)
BUN: 22 mg/dL (ref 8–27)
CO2: 21 mmol/L (ref 20–29)
Calcium: 9.3 mg/dL (ref 8.7–10.3)
Chloride: 98 mmol/L (ref 96–106)
Creatinine, Ser: 1.2 mg/dL — ABNORMAL HIGH (ref 0.57–1.00)
Glucose: 125 mg/dL — ABNORMAL HIGH (ref 70–99)
Potassium: 3.6 mmol/L (ref 3.5–5.2)
Sodium: 137 mmol/L (ref 134–144)
eGFR: 44 mL/min/{1.73_m2} — ABNORMAL LOW (ref >59)

## 2022-06-01 LAB — DIGOXIN LEVEL: Digoxin, Serum: 0.8 ng/mL (ref 0.5–0.9)

## 2022-06-05 ENCOUNTER — Telehealth: Payer: Self-pay | Admitting: Cardiology

## 2022-06-05 NOTE — Telephone Encounter (Signed)
Patient states she is not sure if she needs have work done again today. She says she was told to have it done in ten days after she was last seen, but she also went last week.

## 2022-06-05 NOTE — Telephone Encounter (Signed)
Spoke with patient - advised NO new labs were ordered, after BMET/digoxin level completed last week. She voiced understanding

## 2022-06-12 ENCOUNTER — Telehealth: Payer: Self-pay | Admitting: Family Medicine

## 2022-06-12 NOTE — Telephone Encounter (Signed)
Patient calls back to check on message she left earlier to do labs for [facility] fever. Please call.

## 2022-06-12 NOTE — Telephone Encounter (Signed)
Pt is scheduled to see [facility] at 10:05am on 7/18

## 2022-06-12 NOTE — Telephone Encounter (Signed)
Yes have her be seen, we need to take a look at it and possibly do some blood work, if she cannot be seen here then she can go to an urgent care.

## 2022-06-12 NOTE — Telephone Encounter (Signed)
Pt c/o feeling terrible. She will not be able to drive here and does not feel like having an appointment.   Pt c/o fatigue, body aches all over, site where tick was removed is red, denies bulls eye rash, fever.  Removed tick 3 weeks ago and has felt bad for 2 weeks.  States that she had RMSF three years ago and had to take three rounds of ATB's before she became well.  Pt wants to come in for RMSF labs only. Informed pt that I would get advise from Dr. Warrick Parisian and call her back.

## 2022-06-13 ENCOUNTER — Ambulatory Visit (INDEPENDENT_AMBULATORY_CARE_PROVIDER_SITE_OTHER): Payer: Medicare Other | Admitting: Family

## 2022-06-13 ENCOUNTER — Encounter: Payer: Self-pay | Admitting: Family

## 2022-06-13 VITALS — BP 133/73 | HR 103 | Temp 97.4°F | Ht 64.0 in | Wt 134.4 lb

## 2022-06-13 DIAGNOSIS — W57XXXA Bitten or stung by nonvenomous insect and other nonvenomous arthropods, initial encounter: Secondary | ICD-10-CM

## 2022-06-13 DIAGNOSIS — S80862A Insect bite (nonvenomous), left lower leg, initial encounter: Secondary | ICD-10-CM | POA: Diagnosis not present

## 2022-06-13 DIAGNOSIS — M2550 Pain in unspecified joint: Secondary | ICD-10-CM | POA: Diagnosis not present

## 2022-06-13 DIAGNOSIS — R531 Weakness: Secondary | ICD-10-CM | POA: Diagnosis not present

## 2022-06-13 DIAGNOSIS — I480 Paroxysmal atrial fibrillation: Secondary | ICD-10-CM

## 2022-06-13 DIAGNOSIS — E039 Hypothyroidism, unspecified: Secondary | ICD-10-CM

## 2022-06-13 NOTE — Progress Notes (Signed)
Subjective:    Patient ID: Emily Wagner, female    DOB: 1933-10-31, 88 y.o.   MRN: [id number]  Chief Complaint  Patient presents with   Generalized Body Aches    Recent tick bite    HPI PT presents to the office today with generalized weakness and fatigue. She reports she just feels "bad".   She had a tick bite on left lower leg three weeks ago.   Denies fever, rash. Reports headache and generalized joint pain. 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   She does have CAD and A Fib and followed by Cardiologists.  She has hypothyroidism and takes levothyroxine.     Review of Systems  All other systems reviewed and are negative.      Objective:   Physical Exam Vitals reviewed.  Constitutional:      General: She is not in acute distress.    Appearance: She is well-developed.  HENT:     Head: Normocephalic and atraumatic.     Right Ear: Tympanic membrane normal.     Left Ear: Tympanic membrane normal.  Eyes:     Pupils: Pupils are equal, round, and reactive to light.  Neck:     Thyroid: No thyromegaly.  Cardiovascular:     Rate and Rhythm: Normal rate and regular rhythm.     Heart sounds: Normal heart sounds. No murmur heard. Pulmonary:     Effort: Pulmonary effort is normal. No respiratory distress.     Breath sounds: Normal breath sounds. No wheezing.  Abdominal:     General: Bowel sounds are normal. There is no distension.     Palpations: Abdomen is soft.     Tenderness: There is no abdominal tenderness.  Musculoskeletal:        General: No tenderness. Normal range of motion.     Cervical back: Normal range of motion and neck supple.  Skin:    General: Skin is warm and dry.     Comments: No rash noted  Neurological:     Mental Status: She is alert and oriented to person, place, and time.     Cranial Nerves: No cranial nerve deficit.     Motor: Weakness (using rolling walker) present.     Gait: Gait abnormal.     Deep Tendon Reflexes:  Reflexes are normal and symmetric.  Psychiatric:        Behavior: Behavior normal.        Thought Content: Thought content normal.        Judgment: Judgment normal.       BP 133/73   Pulse (!) 103   Temp (!) 97.4 F (36.3 C)   Ht $R'5\' 4"'NO$  (1.626 m)   Wt 134 lb 6.6 oz (61 kg)   LMP 03/21/1971   SpO2 97%   BMI 23.07 kg/m      Assessment & Plan:  Gabriel Paulding comes in today with chief complaint of Generalized Body Aches (Recent tick bite)   Diagnosis and orders addressed:  1. Tick bite of left lower leg, initial encounter -Pt to report any new fever, joint pain, or rash -Wear protective clothing while outside- Long sleeves and long pants -Put insect repellent on all exposed skin and along clothing -Take a shower as soon as possible after being outside Follow up if symptoms worsen or do not improve  - CBC with Differential/Platelet - CMP14+EGFR - [place] spotted fvr abs pnl(IgG+IgM) - Lyme Disease Serology  w/Reflex  2. Generalized weakness - CBC with Differential/Platelet - CMP14+EGFR - TSH - [place] spotted fvr abs pnl(IgG+IgM) - Lyme Disease Serology w/Reflex  3. Generalized joint pain - CBC with Differential/Platelet - CMP14+EGFR - [place] spotted fvr abs pnl(IgG+IgM) - Lyme Disease Serology w/Reflex  4. Paroxysmal atrial fibrillation (HCC) - CBC with Differential/Platelet - CMP14+EGFR  5. Acquired hypothyroidism - CBC with Differential/Platelet - CMP14+EGFR - TSH   Labs pending Health Maintenance reviewed Diet and exercise encouraged  Follow up plan: Keep follow up with PCP and Cardiologists    Evelina Dun, FNP

## 2022-06-13 NOTE — Patient Instructions (Signed)
Tick Bite Information, Adult Ticks are insects that draw blood for food. Most ticks live in shrubs and grassy and wooded areas. They climb onto people and animals that brush against the leaves and grasses that they rest on. Then they bite, attaching themselves to the skin. Most ticks are harmless, but some ticks may carry germs that can spread to a person through a bite and cause a disease. To reduce your risk of getting a disease from a tick bite, make sure you: Take steps to prevent tick bites. Check for ticks after being outdoors where ticks live. Watch for symptoms of disease if a tick attached to you or if you suspect a tick bite. How can I prevent tick bites? Take these steps to help prevent tick bites when you go outdoors in an area where ticks live: Use insect repellent Use insect repellent that has DEET (20% or higher), picaridin, or IR3535 in it. Follow the instructions on the label. Use these products on: Bare skin. The top of your boots. Your pant legs. Your sleeve cuffs. For insect repellent that contains permethrin, follow the instructions on the label. Use these products on: Clothing. Boots. Outdoor gear. Tents. When you are outside Wear protective clothing. Long sleeves and long pants offer the best protection from ticks. Wear light-colored clothing so you can see ticks more easily. Tuck your pant legs into your socks. If you go walking on a trail, stay in the middle of the trail so your skin, hair, and clothing do not touch the bushes. Avoid walking through areas with long grass. Check for ticks on your clothing, hair, and skin often while you are outside, and check again before you go inside. Make sure to check the scalp, neck, armpits, waist, groin, and joint areas. These are the spots where ticks attach themselves most often. When you go indoors Check your clothing for ticks. Tumble dry clothes in a dryer on high heat for at least 10 minutes. If clothes are damp,  additional time may be needed. If clothes require washing, use hot water. Examine gear and pets. Shower soon after being outdoors. Check your body for ticks. Conduct a full body check using a mirror. What is the proper way to remove a tick? If you find a tick on your body, remove it as soon as possible. Removing a tick sooner can prevent germs from passing to your body. Do not remove the tick with your bare fingers. To remove a tick that is crawling on your skin but has not bitten, use either of these methods: Go outdoors and brush the tick off. Remove the tick with tape or a lint roller. To remove a tick that is attached to your skin: Wash your hands. If you have latex gloves, put them on. Use fine-tipped tweezers, curved forceps, or a tick-removal tool to gently grasp the tick as close to your skin and the tick's head as possible. Gently pull with a steady, upward, even pressure until the tick lets go. When removing the tick: Take care to keep the tick's head attached to its body. Do not twist or jerk the tick. This can make the tick's head or mouth parts break off and remain in the skin. Do not squeeze or crush the tick's body. This could force disease-carrying fluids from the tick into your body. Do not try to remove a tick with heat, alcohol, petroleum jelly, or fingernail polish. Using these methods can cause the tick to salivate and regurgitate into your bloodstream,   increasing your risk of getting a disease. What should I do after removing a tick? Dispose of the tick. Do not crush a tick with your fingers. Clean the bite area and your hands with soap and water, rubbing alcohol, or an iodine scrub. If an antiseptic cream or ointment is available, apply a small amount to the bite site. Wash and disinfect any instruments that you used to remove the tick. How should I dispose of a tick? To dispose of a live tick, use one of these methods: Place it in rubbing alcohol. Place it in a sealed  bag or container. Wrap it tightly in tape. Flush it down the toilet. Contact a health care provider if: You have symptoms of a disease after a tick bite. Symptoms of a tick-borne disease can occur from moments after the tick bites to 30 days after a tick is removed. Symptoms include: Fever or chills. Any of these signs in the bite area: A red rash that makes a circle (bull's-eye rash) in the bite area. Redness and swelling. Headache. Muscle, joint, or bone pain. Abnormal tiredness. Numbness in your legs or difficulty walking or moving your legs. Tender, swollen lymph glands. A part of a tick breaks off and gets stuck in your skin. Get help right away if: You are not able to remove a tick. You experience muscle weakness or paralysis. Your symptoms get worse or you experience new symptoms. You find an engorged tick on your skin and you are in an area where disease from ticks is a high risk. Summary Ticks may carry germs that can spread to a person through a bite and cause a disease. Wear protective clothing and use insect repellent to prevent tick bites. Follow the instructions on the label. If you find a tick on your body, remove it as soon as possible. If the tick is attached, do not try to remove with heat, alcohol, petroleum jelly, or fingernail polish. Remove the attached tick using fine-tipped tweezers, curved forceps, or a tick-removal tool. Gently pull with steady, upward, even pressure until the tick lets go. Do not twist or jerk the tick. Do not squeeze or crush the tick's body. If you have symptoms of a disease after being bitten by a tick, contact a health care provider. This information is not intended to replace advice given to you by your health care provider. Make sure you discuss any questions you have with your health care provider. Document Revised: 11/10/2019 Document Reviewed: 11/10/2019 Elsevier Patient Education  2023 [employer]  

## 2022-06-14 ENCOUNTER — Telehealth: Payer: Self-pay | Admitting: Cardiology

## 2022-06-14 ENCOUNTER — Telehealth: Payer: Self-pay | Admitting: Family Medicine

## 2022-06-14 NOTE — Telephone Encounter (Signed)
Patient calling to check on lab results. Please review and call back.

## 2022-06-14 NOTE — Telephone Encounter (Signed)
Attempted to contact patient, NA

## 2022-06-14 NOTE — Telephone Encounter (Signed)
Pt c/o medication issue:  1. Name of Medication:   digoxin (LANOXIN) 0.125 MG tablet  2. How are you currently taking this medication (dosage and times per day)?   As prescribed  3. Are you having a reaction (difficulty breathing--STAT)?   NO  4. What is your medication issue?    Patient called stating she is having a reaction to this medication.  She is feeling swelling in throat and it is making her feel weak.

## 2022-06-14 NOTE — Telephone Encounter (Signed)
This does not sound like a typical allergy to medication. Recommend follow up with PCP

## 2022-06-14 NOTE — Telephone Encounter (Signed)
Called pt. She states "I'm allergic to a lot of stuff." When asked how long this has been going on she states "after he stated me on that medication." Asked her how long after taking the medication did she take she states "2-3 doses." Pt states "all of my sinuses are swollen and I took Claritin and Allergia and they did not help." "If this is how this medication make me feel I don't want to be on it." HR was 53 this morning. She took he BP while on the phone with me. BP 121/70 HR 81. Pt made aware Dr. Percival Spanish is out of the office for a few weeks but I will get this message to him and our pharmacy to review. She verbalized understanding.

## 2022-06-14 NOTE — Telephone Encounter (Signed)
Called pt and relayed the message to follow up with her PCP. She states "so he wants me to keep taking this medication? I don't understand why they want me to keep taking it when it just makes me feel so bad." Pt made aware we can not force her to take the medication but it is advisable to continue on the medication. "It just makes sense to me to stop the medication and see if I still have the symptoms. If I still have them than it not the medication but if it goes away it the medication. I think that is is simplest answer." Pt states she is going to see her PCP Tuesday.

## 2022-06-15 ENCOUNTER — Telehealth: Payer: Self-pay

## 2022-06-15 NOTE — Telephone Encounter (Signed)
I would be very cautious about just stopping it without a way to monitor her heart rate, if she can check her pulse or wear a watch that is a heart rate monitor then that would make [name] feel better but it sounds like she is going to stop it anyways because she is already stopped it so I would monitor her heart rate closely over the weekend and then speak with somebody else Dr. Rosezella [place] office next week

## 2022-06-15 NOTE — Progress Notes (Signed)
Patient calling with questions about lab work. Please call back.

## 2022-06-15 NOTE — Telephone Encounter (Signed)
Pt was started on Digoxin on June 15th by Dr Warren Lacy. Pt c/o not feeling well ever since. Also, c/o feeling like her throat is closing up when she takes the medication. Pt did call Hochrien's office but he is in the hospital and on vacation next week. Their office told pt that he does not have a back up.  Pt would like to ask Dr. Warrick Parisian if it is alright if she stops the medication? She has not taken it in the past couple of days.

## 2022-06-16 LAB — CMP14+EGFR
ALT: 9 IU/L (ref 0–32)
AST: 16 IU/L (ref 0–40)
Albumin/Globulin Ratio: 1.4 (ref 1.2–2.2)
Albumin: 4.1 g/dL (ref 3.7–4.7)
Alkaline Phosphatase: 91 IU/L (ref 44–121)
BUN/Creatinine Ratio: 17 (ref 12–28)
BUN: 21 mg/dL (ref 8–27)
Bilirubin Total: 0.4 mg/dL (ref 0.0–1.2)
CO2: 24 mmol/L (ref 20–29)
Calcium: 9.8 mg/dL (ref 8.7–10.3)
Chloride: 102 mmol/L (ref 96–106)
Creatinine, Ser: 1.24 mg/dL — ABNORMAL HIGH (ref 0.57–1.00)
Globulin, Total: 2.9 g/dL (ref 1.5–4.5)
Glucose: 107 mg/dL — ABNORMAL HIGH (ref 70–99)
Potassium: 3.7 mmol/L (ref 3.5–5.2)
Sodium: 141 mmol/L (ref 134–144)
Total Protein: 7 g/dL (ref 6.0–8.5)
eGFR: 42 mL/min/{1.73_m2} — ABNORMAL LOW (ref >59)

## 2022-06-16 LAB — CBC WITH DIFFERENTIAL/PLATELET
Basophils Absolute: 0.1 10*3/uL (ref 0.0–0.2)
Basos: 1 %
EOS (ABSOLUTE): 0.2 10*3/uL (ref 0.0–0.4)
Eos: 3 %
Hematocrit: 34 % (ref 34.0–46.6)
Hemoglobin: 10.5 g/dL — ABNORMAL LOW (ref 11.1–15.9)
Immature Grans (Abs): 0 10*3/uL (ref 0.0–0.1)
Immature Granulocytes: 0 %
Lymphocytes Absolute: 1.3 10*3/uL (ref 0.7–3.1)
Lymphs: 22 %
MCH: 25.5 pg — ABNORMAL LOW (ref 26.6–33.0)
MCHC: 30.9 g/dL — ABNORMAL LOW (ref 31.5–35.7)
MCV: 83 fL (ref 79–97)
Monocytes Absolute: 0.6 10*3/uL (ref 0.1–0.9)
Monocytes: 10 %
Neutrophils Absolute: 4 10*3/uL (ref 1.4–7.0)
Neutrophils: 64 %
Platelets: 295 10*3/uL (ref 150–450)
RBC: 4.11 x10E6/uL (ref 3.77–5.28)
RDW: 14.2 % (ref 11.7–15.4)
WBC: 6.2 10*3/uL (ref 3.4–10.8)

## 2022-06-16 LAB — ROCKY MTN SPOTTED FVR ABS PNL(IGG+IGM)
RMSF IgG: NEGATIVE
RMSF IgM: 2.18 index — ABNORMAL HIGH (ref 0.00–0.89)

## 2022-06-16 LAB — LYME DISEASE SEROLOGY W/REFLEX: Lyme Total Antibody EIA: NEGATIVE

## 2022-06-16 LAB — TSH: TSH: 1.84 u[IU]/mL (ref 0.450–4.500)

## 2022-06-16 NOTE — Telephone Encounter (Signed)
Spoke with pt this morning. She has been advised of dr. Merita Norton recommendations. Pt states that she started back on the Digoxin this am. Advised pt to call Hochrein's office again to make them aware of all. She states that she will.  RMSF results still pending. Pt aware.

## 2022-06-19 ENCOUNTER — Telehealth: Payer: Self-pay | Admitting: Family Medicine

## 2022-06-19 NOTE — Telephone Encounter (Signed)
Patient viewed results from labs on 7/18 through [facility] but would like to speak to a nurse about them. Please call back.

## 2022-06-19 NOTE — Telephone Encounter (Signed)
Patient's daughter had viewed results via mychart but patient wanted to know what they showed also.  Discussed results with patient.

## 2022-06-20 MED ORDER — DOXYCYCLINE HYCLATE 100 MG PO TABS: 100.0000 mg | ORAL_TABLET | Freq: Two times a day (BID) | ORAL | 0 refills | Status: DC

## 2022-06-20 NOTE — Addendum Note (Signed)
Addended by: Evelina Dun A on: 06/20/2022 01:35 PM   Modules accepted: Orders

## 2022-06-22 NOTE — Telephone Encounter (Signed)
See lab results, discussed with patient, encounter closed

## 2022-06-26 ENCOUNTER — Ambulatory Visit (INDEPENDENT_AMBULATORY_CARE_PROVIDER_SITE_OTHER): Payer: Medicare Other | Admitting: Family Medicine

## 2022-06-26 ENCOUNTER — Encounter: Payer: Self-pay | Admitting: Family Medicine

## 2022-06-26 VITALS — BP 120/76 | HR 94 | Temp 97.0°F | Ht 64.0 in | Wt 136.0 lb

## 2022-06-26 DIAGNOSIS — E7800 Pure hypercholesterolemia, unspecified: Secondary | ICD-10-CM | POA: Diagnosis not present

## 2022-06-26 DIAGNOSIS — E039 Hypothyroidism, unspecified: Secondary | ICD-10-CM

## 2022-06-26 DIAGNOSIS — K219 Gastro-esophageal reflux disease without esophagitis: Secondary | ICD-10-CM | POA: Diagnosis not present

## 2022-06-26 DIAGNOSIS — I10 Essential (primary) hypertension: Secondary | ICD-10-CM | POA: Diagnosis not present

## 2022-06-26 DIAGNOSIS — F4323 Adjustment disorder with mixed anxiety and depressed mood: Secondary | ICD-10-CM

## 2022-06-26 MED ORDER — DOXYCYCLINE HYCLATE 100 MG PO TABS: 100.0000 mg | ORAL_TABLET | Freq: Two times a day (BID) | ORAL | 0 refills | Status: DC

## 2022-06-26 MED ORDER — PANTOPRAZOLE SODIUM 40 MG PO TBEC: 40.0000 mg | DELAYED_RELEASE_TABLET | Freq: Every day | ORAL | 3 refills | Status: DC

## 2022-06-26 NOTE — Addendum Note (Signed)
Addended by: Caryl Pina on: 06/26/2022 11:05 AM   Modules accepted: Orders

## 2022-06-26 NOTE — Progress Notes (Signed)
BP 120/76   Pulse 94   Temp (!) 97 F (36.1 C)   Ht 5' 4" (1.626 m)   Wt 136 lb (61.7 kg)   LMP 03/21/1971   SpO2 98%   BMI 23.34 kg/m    Subjective:   Patient ID: Emily Wagner, female    DOB: 11-16-1933, 88 y.o.   MRN: [id number]  HPI: Emily Wagner is a 88 y.o. female presenting on 06/26/2022 for Medical Management of Chronic Issues and Hypertension   HPI Hypertension Patient is currently on diltiazem and hydrochlorothiazide, and their blood pressure today is 120/76. Patient denies any lightheadedness or dizziness. Patient denies headaches, blurred vision, chest pains, shortness of breath, or weakness. Denies any side effects from medication and is content with current medication.   Hyperlipidemia Patient is coming in for recheck of his hyperlipidemia. The patient is currently taking no medicine, has been intolerant of all statins and cholesterol medicines. They deny any issues with myalgias or history of liver damage from it. They deny any focal numbness or weakness or chest pain.   Hypothyroidism recheck Patient is coming in for thyroid recheck today as well. They deny any issues with hair changes or heat or cold problems or diarrhea or constipation. They deny any chest pain or palpitations. They are currently on levothyroxine 50 micrograms   Relevant past medical, surgical, family and social history reviewed and updated as indicated. Interim medical history since our last visit reviewed. Allergies and medications reviewed and updated.  Review of Systems  Constitutional:  Negative for chills and fever.  Eyes:  Negative for visual disturbance.  Respiratory:  Negative for chest tightness and shortness of breath.   Cardiovascular:  Negative for chest pain and leg swelling.  Genitourinary:  Negative for difficulty urinating and dysuria.  Musculoskeletal:  Negative for back pain and gait problem.  Skin:  Negative for rash.  Neurological:  Negative for dizziness,  light-headedness and headaches.  Psychiatric/Behavioral:  Negative for agitation and behavioral problems.   All other systems reviewed and are negative.   Per HPI unless specifically indicated above   Allergies as of 06/26/2022       Reactions   Nitrofurantoin Other (See Comments)   Liver failure   Nsaids Other (See Comments)   Kidney failure   Other Other (See Comments)   Most antibiotics require supervision per patient   Flonase [fluticasone Propionate] Other (See Comments)   headache   Prevnar 13 [pneumococcal 13-val Conj Vacc] Other (See Comments)   Nerve damage in left arm   Alendronate Sodium Other (See Comments)   Beta Adrenergic Blockers    Ciprofloxacin Other (See Comments)   Patient denies allergy--"I take Cipro for bladder infections"   Codeine Other (See Comments)   Crestor [rosuvastatin Calcium] Other (See Comments)   Darvon Other (See Comments)   Lipitor [atorvastatin Calcium] Other (See Comments)   Nortriptyline Hcl Other (See Comments)   States she felt like worms where crawling under her skin.   Potassium-containing Compounds    Rash and raw areas / sore breast    Pravachol Other (See Comments)   Propranolol Hcl Other (See Comments)   Unknown   Sulfa Antibiotics Other (See Comments)   Sulfamethoxazole Other (See Comments)   Unknown   Ultram [tramadol Hcl] Itching   Welchol [colesevelam Hcl] Other (See Comments)   Zetia [ezetimibe] Other (See Comments)   Zocor [simvastatin] Other (See Comments)        Medication List  Accurate as of June 26, 2022 10:55 AM. If you have any questions, ask your nurse or doctor.          STOP taking these medications    doxycycline 100 MG tablet Commonly known as: VIBRA-TABS Stopped by: [name] Ammiel Guiney, MD       TAKE these medications    acetaminophen 325 MG tablet Commonly known as: TYLENOL Take 325 mg by mouth every 6 (six) hours as needed.   apixaban 5 MG Tabs tablet Commonly known as:  ELIQUIS Take 1 tablet (5 mg total) by mouth 2 (two) times daily.   cholecalciferol 25 MCG (1000 UNIT) tablet Commonly known as: VITAMIN D3 Take 1,000 Units by mouth daily.   digoxin 0.125 MG tablet Commonly known as: LANOXIN Take 1 tablet (0.125 mg total) by mouth daily.   diltiazem 30 MG tablet Commonly known as: Cardizem Take 1 tablet (30 mg total) by mouth 2 (two) times daily.   famotidine 20 MG tablet Commonly known as: PEPCID Take 1 tablet (20 mg total) by mouth 2 (two) times daily.   hydrochlorothiazide 25 MG tablet Commonly known as: HYDRODIURIL Take 1 tablet (25 mg total) by mouth daily.   levothyroxine 50 MCG tablet Commonly known as: SYNTHROID Take 1 tablet (50 mcg total) by mouth daily before breakfast.   meclizine 12.5 MG tablet Commonly known as: ANTIVERT Take 1 tablet (12.5 mg total) by mouth 3 (three) times daily as needed for dizziness. What changed: when to take this   pantoprazole 40 MG tablet Commonly known as: PROTONIX Take 1 tablet (40 mg total) by mouth daily. Started by: Worthy Rancher, MD   PRESERVISION AREDS 2 PO Take 1 capsule by mouth at bedtime.   QUEtiapine 50 MG tablet Commonly known as: SEROQUEL Take 1 tablet (50 mg total) by mouth at bedtime.   VITAMIN B-12 PO Take 500 mcg by mouth. Take one tablet by mouth on Mon.,Wed., and Friday         Objective:   BP 120/76   Pulse 94   Temp (!) 97 F (36.1 C)   Ht _0  (1.626 m)   Wt 136 lb (61.7 kg)   LMP 03/21/1971   SpO2 98%   BMI 23.34 kg/m   Wt Readings from Last 3 Encounters:  06/26/22 136 lb (61.7 kg)  06/13/22 134 lb 6.6 oz (61 kg)  05/24/22 136 lb (61.7 kg)    Physical Exam Vitals and nursing note reviewed.  Constitutional:      General: She is not in acute distress.    Appearance: She is well-developed. She is not diaphoretic.  Eyes:     Conjunctiva/sclera: Conjunctivae normal.  Cardiovascular:     Rate and Rhythm: Normal rate and regular rhythm.      Heart sounds: Normal heart sounds. No murmur heard. Pulmonary:     Effort: Pulmonary effort is normal. No respiratory distress.     Breath sounds: Normal breath sounds. No wheezing.  Musculoskeletal:        General: No swelling or tenderness. Normal range of motion.  Skin:    General: Skin is warm and dry.     Findings: No rash.  Neurological:     Mental Status: She is alert and oriented to person, place, and time.     Coordination: Coordination normal.  Psychiatric:        Behavior: Behavior normal.       Assessment & Plan:   Problem List Items Addressed This Visit  Cardiovascular and Mediastinum   Essential hypertension - Primary   Relevant Orders   CMP14+EGFR     Endocrine   Acquired hypothyroidism     Other   HYPERCHOLESTEROLEMIA   Other Visit Diagnoses     Gastroesophageal reflux disease without esophagitis       Relevant Medications   pantoprazole (PROTONIX) 40 MG tablet   Other Relevant Orders   CBC with Differential/Platelet   CMP14+EGFR       She feels like she still has chills from the recommend spotted fever and she is back to finish cycle looks refilled.  She did test positive for [facility] spotted fever  Assured patient that Tylenol does not go through the kidneys and she could take the Tylenol  Hemoglobin was down slightly at last visit, will recheck that today.  Patient has been having more heartburn, switch from famotidine to Protonix but she can still use the famotidine as needed. Follow up plan: Return if symptoms worsen or fail to improve, for 6-8 weeks.  Counseling provided for all of the vaccine components Orders Placed This Encounter  Procedures   CBC with Differential/Platelet   CMP14+EGFR    Caryl Pina, MD [facility] 06/26/2022, 10:55 AM

## 2022-06-27 LAB — CMP14+EGFR
ALT: 10 IU/L (ref 0–32)
AST: 16 IU/L (ref 0–40)
Albumin/Globulin Ratio: 1.7 (ref 1.2–2.2)
Albumin: 4.3 g/dL (ref 3.7–4.7)
Alkaline Phosphatase: 94 IU/L (ref 44–121)
BUN/Creatinine Ratio: 19 (ref 12–28)
BUN: 24 mg/dL (ref 8–27)
Bilirubin Total: 0.5 mg/dL (ref 0.0–1.2)
CO2: 23 mmol/L (ref 20–29)
Calcium: 9.7 mg/dL (ref 8.7–10.3)
Chloride: 97 mmol/L (ref 96–106)
Creatinine, Ser: 1.25 mg/dL — ABNORMAL HIGH (ref 0.57–1.00)
Globulin, Total: 2.5 g/dL (ref 1.5–4.5)
Glucose: 105 mg/dL — ABNORMAL HIGH (ref 70–99)
Potassium: 4 mmol/L (ref 3.5–5.2)
Sodium: 137 mmol/L (ref 134–144)
Total Protein: 6.8 g/dL (ref 6.0–8.5)
eGFR: 42 mL/min/{1.73_m2} — ABNORMAL LOW (ref >59)

## 2022-06-27 LAB — CBC WITH DIFFERENTIAL/PLATELET
Basophils Absolute: 0.1 10*3/uL (ref 0.0–0.2)
Basos: 1 %
EOS (ABSOLUTE): 0.1 10*3/uL (ref 0.0–0.4)
Eos: 2 %
Hematocrit: 33.9 % — ABNORMAL LOW (ref 34.0–46.6)
Hemoglobin: 10.7 g/dL — ABNORMAL LOW (ref 11.1–15.9)
Immature Grans (Abs): 0 10*3/uL (ref 0.0–0.1)
Immature Granulocytes: 0 %
Lymphocytes Absolute: 1.4 10*3/uL (ref 0.7–3.1)
Lymphs: 22 %
MCH: 25.4 pg — ABNORMAL LOW (ref 26.6–33.0)
MCHC: 31.6 g/dL (ref 31.5–35.7)
MCV: 81 fL (ref 79–97)
Monocytes Absolute: 0.7 10*3/uL (ref 0.1–0.9)
Monocytes: 11 %
Neutrophils Absolute: 4.3 10*3/uL (ref 1.4–7.0)
Neutrophils: 64 %
Platelets: 348 10*3/uL (ref 150–450)
RBC: 4.21 x10E6/uL (ref 3.77–5.28)
RDW: 14.4 % (ref 11.7–15.4)
WBC: 6.6 10*3/uL (ref 3.4–10.8)

## 2022-07-07 ENCOUNTER — Ambulatory Visit (INDEPENDENT_AMBULATORY_CARE_PROVIDER_SITE_OTHER): Payer: Medicare Other | Admitting: Family Medicine

## 2022-07-07 ENCOUNTER — Encounter: Payer: Self-pay | Admitting: Family Medicine

## 2022-07-07 DIAGNOSIS — F4323 Adjustment disorder with mixed anxiety and depressed mood: Secondary | ICD-10-CM | POA: Diagnosis not present

## 2022-07-07 DIAGNOSIS — R110 Nausea: Secondary | ICD-10-CM | POA: Diagnosis not present

## 2022-07-07 MED ORDER — PAROXETINE HCL 20 MG PO TABS: 20.0000 mg | ORAL_TABLET | Freq: Every day | ORAL | 2 refills | Status: DC

## 2022-07-07 MED ORDER — ONDANSETRON HCL 4 MG PO TABS: 4.0000 mg | ORAL_TABLET | Freq: Three times a day (TID) | ORAL | 1 refills | Status: DC | PRN

## 2022-07-07 NOTE — Progress Notes (Signed)
Virtual Visit via telephone Note  I connected with Emily Wagner on 07/07/22 at 1145 by telephone and verified that I am speaking with the correct person using two identifiers. Emily Wagner is currently located at home and patient are currently with her during visit. The provider, [name] Thong Feeny, MD is located in their office at time of visit.  Call ended at 1200  I discussed the limitations, risks, security and privacy concerns of performing an evaluation and management service by telephone and the availability of in person appointments. I also discussed with the patient that there may be a patient responsible charge related to this service. The patient expressed understanding and agreed to proceed.   History and Present Illness: She has  a lot of fatigue and decreased energy and she runs out of energy. She does not have a good appetite and gets dry heaves from more solid foods sometimes.  She says some things feel like they want to come back up. She does have sadness and feeling down and depressed. She has been feeling this way for 2 weeks. She still has feeling of decreased energy.  She denies suicidal ideations but does feel like she is just wants to be done with things.  She just has not been feeling the best.  She says her appetite sometimes is affecting her health but then she also says with  [facility] spotted fever for which she is recovering she is having issues and she is also taking doxycycline which is causing some issues with her stomach.  1. Adjustment disorder with mixed anxiety and depressed mood   2. Nausea     Outpatient Encounter Medications as of 07/07/2022  Medication Sig   ondansetron (ZOFRAN) 4 MG tablet Take 1 tablet (4 mg total) by mouth every 8 (eight) hours as needed for nausea or vomiting.   PARoxetine (PAXIL) 20 MG tablet Take 1 tablet (20 mg total) by mouth daily.   acetaminophen (TYLENOL) 325 MG tablet Take 325 mg by mouth every 6 (six) hours as  needed.   apixaban (ELIQUIS) 5 MG TABS tablet Take 1 tablet (5 mg total) by mouth 2 (two) times daily.   cholecalciferol (VITAMIN D3) 25 MCG (1000 UNIT) tablet Take 1,000 Units by mouth daily.   Cyanocobalamin (VITAMIN B-12 PO) Take 500 mcg by mouth. Take one tablet by mouth on Mon.,Wed., and Friday   digoxin (LANOXIN) 0.125 MG tablet Take 1 tablet (0.125 mg total) by mouth daily.   diltiazem (CARDIZEM) 30 MG tablet Take 1 tablet (30 mg total) by mouth 2 (two) times daily.   famotidine (PEPCID) 20 MG tablet Take 1 tablet (20 mg total) by mouth 2 (two) times daily.   hydrochlorothiazide (HYDRODIURIL) 25 MG tablet Take 1 tablet (25 mg total) by mouth daily.   levothyroxine (SYNTHROID) 50 MCG tablet Take 1 tablet (50 mcg total) by mouth daily before breakfast.   meclizine (ANTIVERT) 12.5 MG tablet Take 1 tablet (12.5 mg total) by mouth 3 (three) times daily as needed for dizziness. (Patient taking differently: Take 12.5 mg by mouth as needed for dizziness.)   Multiple Vitamins-Minerals (PRESERVISION AREDS 2 PO) Take 1 capsule by mouth at bedtime.   pantoprazole (PROTONIX) 40 MG tablet Take 1 tablet (40 mg total) by mouth daily.   QUEtiapine (SEROQUEL) 50 MG tablet Take 1 tablet (50 mg total) by mouth at bedtime.   [DISCONTINUED] doxycycline (VIBRA-TABS) 100 MG tablet Take 1 tablet (100 mg total) by mouth 2 (  two) times daily.   Facility-Administered Encounter Medications as of 07/07/2022  Medication   methylPREDNISolone acetate (DEPO-MEDROL) injection 80 mg    Review of Systems  Constitutional:  Negative for chills and fever.  Eyes:  Negative for visual disturbance.  Respiratory:  Negative for chest tightness and shortness of breath.   Cardiovascular:  Negative for chest pain and leg swelling.  Gastrointestinal:  Positive for nausea. Negative for abdominal pain, anal bleeding, constipation, diarrhea and vomiting.  Musculoskeletal:  Negative for back pain and gait problem.  Skin:  Negative for  rash.  Neurological:  Negative for dizziness, light-headedness and headaches.  Psychiatric/Behavioral:  Negative for agitation and behavioral problems.   All other systems reviewed and are negative.   Observations/Objective: Patient sounds comfortable and in no acute distress  Assessment and Plan: Problem List Items Addressed This Visit       Other   Adjustment disorder with mixed anxiety and depressed mood - Primary   Relevant Medications   PARoxetine (PAXIL) 20 MG tablet   Other Visit Diagnoses     Nausea       Relevant Medications   ondansetron (ZOFRAN) 4 MG tablet     Will start Paxil and Zofran to see if they help with her nausea and her anxiety and nerves.  She will follow-up in a month because she has not appointment then anyways.  Follow up plan: Return in about 4 weeks (around 08/04/2022), or if symptoms worsen or fail to improve, for Anxiety and depression.     I discussed the assessment and treatment plan with the patient. The patient was provided an opportunity to ask questions and all were answered. The patient agreed with the plan and demonstrated an understanding of the instructions.   The patient was advised to call back or seek an in-person evaluation if the symptoms worsen or if the condition fails to improve as anticipated.  The above assessment and management plan was discussed with the patient. The patient verbalized understanding of and has agreed to the management plan. Patient is aware to call the clinic if symptoms persist or worsen. Patient is aware when to return to the clinic for a follow-up visit. Patient educated on when it is appropriate to go to the emergency department.    I provided 15 minutes of non-face-to-face time during this encounter.    Worthy Rancher, MD

## 2022-08-01 ENCOUNTER — Telehealth: Payer: Self-pay | Admitting: Family Medicine

## 2022-08-01 NOTE — Telephone Encounter (Signed)
Patient calling to speak to nurse about tick bite. Refused appointment. Please call back.

## 2022-08-01 NOTE — Telephone Encounter (Signed)
Returned patients call - she states that she has RMSF and she wanted to know if Dr. Windell Moment will call her in another abx.  She found a tick in beaded on her yesterday. Tried to get patient to schedule an appointment but she requested this message me sent.  Aware you are out of the office today.

## 2022-08-02 ENCOUNTER — Telehealth: Payer: Self-pay | Admitting: Family Medicine

## 2022-08-02 NOTE — Telephone Encounter (Signed)
Yes patient will need an appointment to be seen for any new infections that have arisen

## 2022-08-02 NOTE — Telephone Encounter (Signed)
Pt informed. Appt made for 9/7 at 2:55pm

## 2022-08-02 NOTE — Telephone Encounter (Signed)
Pt wanted to make [name] aware that she has fallen while trying to take a bath. She has injured her knee. Daughter will still bring her to the appt tomorrow. Confirmed with pt that we do have wheelchairs and a nurse would come out and help her daughter if needed. Advised pt to elevate knee affected and to use ice. Pt states that she is applying asper cream for now.

## 2022-08-03 ENCOUNTER — Ambulatory Visit: Payer: Medicare Other | Admitting: Family Medicine

## 2022-08-04 DIAGNOSIS — I10 Essential (primary) hypertension: Secondary | ICD-10-CM | POA: Diagnosis not present

## 2022-08-04 DIAGNOSIS — E079 Disorder of thyroid, unspecified: Secondary | ICD-10-CM | POA: Diagnosis not present

## 2022-08-04 DIAGNOSIS — Z885 Allergy status to narcotic agent status: Secondary | ICD-10-CM | POA: Diagnosis not present

## 2022-08-04 DIAGNOSIS — Z888 Allergy status to other drugs, medicaments and biological substances status: Secondary | ICD-10-CM | POA: Diagnosis not present

## 2022-08-04 DIAGNOSIS — M25572 Pain in left ankle and joints of left foot: Secondary | ICD-10-CM | POA: Diagnosis not present

## 2022-08-04 DIAGNOSIS — Z7901 Long term (current) use of anticoagulants: Secondary | ICD-10-CM | POA: Diagnosis not present

## 2022-08-04 DIAGNOSIS — Z881 Allergy status to other antibiotic agents status: Secondary | ICD-10-CM | POA: Diagnosis not present

## 2022-08-04 DIAGNOSIS — S8262XA Displaced fracture of lateral malleolus of left fibula, initial encounter for closed fracture: Secondary | ICD-10-CM | POA: Diagnosis not present

## 2022-08-04 DIAGNOSIS — Z886 Allergy status to analgesic agent status: Secondary | ICD-10-CM | POA: Diagnosis not present

## 2022-08-07 ENCOUNTER — Telehealth: Payer: Self-pay | Admitting: Family Medicine

## 2022-08-07 DIAGNOSIS — M25572 Pain in left ankle and joints of left foot: Secondary | ICD-10-CM | POA: Diagnosis not present

## 2022-08-07 DIAGNOSIS — M25472 Effusion, left ankle: Secondary | ICD-10-CM | POA: Diagnosis not present

## 2022-08-07 DIAGNOSIS — M7989 Other specified soft tissue disorders: Secondary | ICD-10-CM | POA: Diagnosis not present

## 2022-08-07 DIAGNOSIS — S82832A Other fracture of upper and lower end of left fibula, initial encounter for closed fracture: Secondary | ICD-10-CM | POA: Diagnosis not present

## 2022-08-07 NOTE — Telephone Encounter (Signed)
Left message for [name] informing of an appt on 9/21 and to arrive at 8:45am. Appt made for 77m Instructed [name] to call back if the appt date and time does not work for them.

## 2022-08-10 ENCOUNTER — Ambulatory Visit: Payer: Medicare Other | Admitting: Family Medicine

## 2022-08-10 DIAGNOSIS — S8262XA Displaced fracture of lateral malleolus of left fibula, initial encounter for closed fracture: Secondary | ICD-10-CM | POA: Diagnosis not present

## 2022-08-10 DIAGNOSIS — W010XXA Fall on same level from slipping, tripping and stumbling without subsequent striking against object, initial encounter: Secondary | ICD-10-CM | POA: Diagnosis not present

## 2022-08-17 ENCOUNTER — Other Ambulatory Visit: Payer: Self-pay | Admitting: Family Medicine

## 2022-08-17 ENCOUNTER — Ambulatory Visit (INDEPENDENT_AMBULATORY_CARE_PROVIDER_SITE_OTHER): Payer: Medicare Other | Admitting: Family Medicine

## 2022-08-17 ENCOUNTER — Encounter: Payer: Self-pay | Admitting: Family Medicine

## 2022-08-17 VITALS — BP 117/73 | HR 100 | Temp 97.4°F | Ht 64.0 in | Wt 130.0 lb

## 2022-08-17 DIAGNOSIS — R3589 Other polyuria: Secondary | ICD-10-CM

## 2022-08-17 DIAGNOSIS — Z23 Encounter for immunization: Secondary | ICD-10-CM | POA: Diagnosis not present

## 2022-08-17 DIAGNOSIS — S50362A Insect bite (nonvenomous) of left elbow, initial encounter: Secondary | ICD-10-CM

## 2022-08-17 DIAGNOSIS — W57XXXA Bitten or stung by nonvenomous insect and other nonvenomous arthropods, initial encounter: Secondary | ICD-10-CM | POA: Diagnosis not present

## 2022-08-17 DIAGNOSIS — B91 Sequelae of poliomyelitis: Secondary | ICD-10-CM | POA: Diagnosis not present

## 2022-08-17 DIAGNOSIS — R5383 Other fatigue: Secondary | ICD-10-CM | POA: Diagnosis not present

## 2022-08-17 LAB — MICROSCOPIC EXAMINATION
Renal Epithel, UA: NONE SEEN /hpf
WBC, UA: >30 /hpf — AB (ref 0–5)

## 2022-08-17 LAB — URINALYSIS, COMPLETE
Bilirubin, UA: NEGATIVE
Glucose, UA: NEGATIVE
Ketones, UA: NEGATIVE
Nitrite, UA: NEGATIVE
Protein,UA: NEGATIVE
Specific Gravity, UA: 1.02 (ref 1.005–1.030)
Urobilinogen, Ur: 0.2 mg/dL (ref 0.2–1.0)
pH, UA: 5.5 (ref 5.0–7.5)

## 2022-08-17 MED ORDER — CEPHALEXIN 500 MG PO CAPS: 500.0000 mg | ORAL_CAPSULE | Freq: Four times a day (QID) | ORAL | 0 refills | Status: DC

## 2022-08-17 NOTE — Progress Notes (Signed)
BP 117/73   Pulse 100   Temp (!) 97.4 F (36.3 C)   Ht '5\' 4"'$  (6.283 m)   Wt 130 lb (59 kg)   LMP 03/21/1971   SpO2 100%   BMI 22.31 kg/m    Subjective:   Patient ID: Emily Wagner, female    DOB: 11/18/1933, 88 y.o.   MRN: [id number]  HPI: Emily Wagner is a 88 y.o. female presenting on 08/17/2022 for Fatigue and Polyuria (With odor)   HPI Patient is coming in with continued fatigue and has 2 more tick bites.  The new tick bites that she had recently.  She has stayed fatigued since the last time she was treated for [facility] spotted fever and her energy stays down and she feels like she is fatigued all the time.  Per her daughter who is caretaker says that her mood is actually increased and the Paxil is helping with the depression.  Denies any suicidal ideations or thoughts of hurting herself.  She had a recent fracture of her foot and is seeing an orthopedic for that.  Patient is coming in complaining of polyuria and frequency that is been going on over the past couple weeks.  She says she gets out a little urine but has to go frequently and especially wakes up multiple times at night going frequently.  She denies any dysuria or hematuria or fevers or chills.  Relevant past medical, surgical, family and social history reviewed and updated as indicated. Interim medical history since our last visit reviewed. Allergies and medications reviewed and updated.  Review of Systems  Constitutional:  Positive for fatigue. Negative for chills and fever.  Eyes:  Negative for redness and visual disturbance.  Respiratory:  Negative for chest tightness and shortness of breath.   Cardiovascular:  Negative for chest pain and leg swelling.  Genitourinary:  Positive for frequency and urgency. Negative for difficulty urinating and dysuria.  Musculoskeletal:  Positive for arthralgias and gait problem. Negative for back pain.  Skin:  Negative for rash.  Neurological:  Negative for  light-headedness and headaches.  Psychiatric/Behavioral:  Negative for agitation and behavioral problems.   All other systems reviewed and are negative.   Per HPI unless specifically indicated above   Allergies as of 08/17/2022       Reactions   Nitrofurantoin Other (See Comments)   Liver failure   Nsaids Other (See Comments)   Kidney failure   Other Other (See Comments)   Most antibiotics require supervision per patient   Flonase [fluticasone Propionate] Other (See Comments)   headache   Prevnar 13 [pneumococcal 13-val Conj Vacc] Other (See Comments)   Nerve damage in left arm   Alendronate Sodium Other (See Comments)   Beta Adrenergic Blockers    Ciprofloxacin Other (See Comments)   Patient denies allergy--"I take Cipro for bladder infections"   Codeine Other (See Comments)   Crestor [rosuvastatin Calcium] Other (See Comments)   Darvon Other (See Comments)   Lipitor [atorvastatin Calcium] Other (See Comments)   Nortriptyline Hcl Other (See Comments)   States she felt like worms where crawling under her skin.   Potassium-containing Compounds    Rash and raw areas / sore breast    Pravachol Other (See Comments)   Propranolol Hcl Other (See Comments)   Unknown   Sulfa Antibiotics Other (See Comments)   Sulfamethoxazole Other (See Comments)   Unknown   Ultram [tramadol Hcl] Itching   Welchol [colesevelam Hcl] Other (See  Comments)   Zetia [ezetimibe] Other (See Comments)   Zocor [simvastatin] Other (See Comments)        Medication List        Accurate as of August 17, 2022  9:40 AM. If you have any questions, ask your nurse or doctor.          STOP taking these medications    pantoprazole 40 MG tablet Commonly known as: PROTONIX       TAKE these medications    acetaminophen 325 MG tablet Commonly known as: TYLENOL Take 325 mg by mouth every 6 (six) hours as needed.   apixaban 5 MG Tabs tablet Commonly known as: ELIQUIS Take 1 tablet (5 mg total)  by mouth 2 (two) times daily.   cholecalciferol 25 MCG (1000 UNIT) tablet Commonly known as: VITAMIN D3 Take 1,000 Units by mouth daily.   digoxin 0.125 MG tablet Commonly known as: LANOXIN Take 1 tablet (0.125 mg total) by mouth daily.   diltiazem 30 MG tablet Commonly known as: Cardizem Take 1 tablet (30 mg total) by mouth 2 (two) times daily.   famotidine 20 MG tablet Commonly known as: PEPCID Take 1 tablet (20 mg total) by mouth 2 (two) times daily.   hydrochlorothiazide 25 MG tablet Commonly known as: HYDRODIURIL Take 1 tablet (25 mg total) by mouth daily.   levothyroxine 50 MCG tablet Commonly known as: SYNTHROID Take 1 tablet (50 mcg total) by mouth daily before breakfast.   meclizine 12.5 MG tablet Commonly known as: ANTIVERT Take 1 tablet (12.5 mg total) by mouth 3 (three) times daily as needed for dizziness. What changed: when to take this   ondansetron 4 MG tablet Commonly known as: Zofran Take 1 tablet (4 mg total) by mouth every 8 (eight) hours as needed for nausea or vomiting.   PARoxetine 20 MG tablet Commonly known as: PAXIL Take 1 tablet (20 mg total) by mouth daily.   PRESERVISION AREDS 2 PO Take 1 capsule by mouth at bedtime.   QUEtiapine 50 MG tablet Commonly known as: SEROQUEL Take 1 tablet (50 mg total) by mouth at bedtime.   VITAMIN B-12 PO Take 500 mcg by mouth. Take one tablet by mouth on Mon.,Wed., and Friday         Objective:   BP 117/73   Pulse 100   Temp (!) 97.4 F (36.3 C)   Ht '5\' 4"'$  (1.626 m)   Wt 130 lb (59 kg)   LMP 03/21/1971   SpO2 100%   BMI 22.31 kg/m   Wt Readings from Last 3 Encounters:  08/17/22 130 lb (59 kg)  06/26/22 136 lb (61.7 kg)  06/13/22 134 lb 6.6 oz (61 kg)    Physical Exam Vitals and nursing note reviewed.  Constitutional:      General: She is not in acute distress.    Appearance: She is well-developed. She is not diaphoretic.  Eyes:     Conjunctiva/sclera: Conjunctivae normal.   Cardiovascular:     Rate and Rhythm: Normal rate and regular rhythm.     Heart sounds: Normal heart sounds. No murmur heard. Pulmonary:     Effort: Pulmonary effort is normal. No respiratory distress.     Breath sounds: Normal breath sounds. No wheezing.  Musculoskeletal:        General: No tenderness. Normal range of motion.  Skin:    General: Skin is warm and dry.     Findings: Lesion (2 small eschars, 1 near left elbow and 1 just  above it on left arm, both are healed well) present. No rash.  Neurological:     Mental Status: She is alert and oriented to person, place, and time.     Coordination: Coordination normal.  Psychiatric:        Behavior: Behavior normal.       Assessment & Plan:   Problem List Items Addressed This Visit       Nervous and Auditory   POST-POLIO SYNDROME   Other Visit Diagnoses     Polyuria    -  Primary   Relevant Orders   Urinalysis, Complete   Urine Culture   Other fatigue       Relevant Orders   Rocky mtn spotted fvr abs pnl(IgG+IgM)   Tick bite of left elbow, initial encounter       Relevant Orders   Rocky mtn spotted fvr abs pnl(IgG+IgM)       Urine and blood work pending, we will see what those show to determine how we treat. Follow up plan: Return if symptoms worsen or fail to improve.  Counseling provided for all of the vaccine components Orders Placed This Encounter  Procedures   Urine Culture   Rocky mtn spotted fvr abs pnl(IgG+IgM)   Urinalysis, Complete    Caryl Pina, MD [facility] 08/17/2022, 9:40 AM

## 2022-08-17 NOTE — Progress Notes (Signed)
Patient aware and verbalized understanding. °

## 2022-08-18 LAB — ROCKY MTN SPOTTED FVR ABS PNL(IGG+IGM)
RMSF IgG: NEGATIVE
RMSF IgM: 2.46 index — ABNORMAL HIGH (ref 0.00–0.89)

## 2022-08-21 ENCOUNTER — Telehealth: Payer: Self-pay | Admitting: Family Medicine

## 2022-08-21 ENCOUNTER — Other Ambulatory Visit: Payer: Self-pay | Admitting: *Deleted

## 2022-08-21 MED ORDER — DOXYCYCLINE HYCLATE 100 MG PO TABS: 100.0000 mg | ORAL_TABLET | Freq: Two times a day (BID) | ORAL | 0 refills | Status: DC

## 2022-08-21 NOTE — Progress Notes (Signed)
Opened in error

## 2022-08-21 NOTE — Telephone Encounter (Signed)
Left message for Emily Wagner to return call

## 2022-08-21 NOTE — Telephone Encounter (Signed)
Pts daughter called requesting to speak with PCP or nurse. Has questions about some notes on patients mychart.

## 2022-08-21 NOTE — Telephone Encounter (Signed)
Emily Wagner had questions about ATB.  Per Emily Wagner pt has finished one course of Doxy for RMSF plus Dr. Warrick Parisian had prescribed cephalexin for UTI.  Advised that pt is to stop Cephalexin and start new course of Doxy. Rx for Doxy '100mg'$  BID for 14d sent to [facility] in [place].

## 2022-08-22 ENCOUNTER — Telehealth: Payer: Self-pay | Admitting: Family Medicine

## 2022-08-22 NOTE — Telephone Encounter (Signed)
  Incoming Patient Call  08/22/2022  What symptoms do you have? Stomach pain from [facility] Spotted Fever, offered pt appt she declined wants to speak to [facility]  How long have you been sick? Three weeks  Have you been seen for this problem? Yes seen by Dr. Keturah Barre last week  If your provider decides to give you a prescription, which pharmacy would you like for it to be sent to? [facility]    Patient informed that this information will be sent to the clinical staff for review and that they should receive a follow up call.

## 2022-08-22 NOTE — Telephone Encounter (Signed)
Patient calling back about wanting to have medicine called in for pain. Did not want appt.

## 2022-08-23 LAB — URINE CULTURE

## 2022-08-23 NOTE — Telephone Encounter (Signed)
Because of her blood thinners, tylenol is best for her to use, she could also use some OTC maalox to help her stomach

## 2022-08-23 NOTE — Telephone Encounter (Signed)
Patient aware and verbalizes understanding. 

## 2022-08-24 DIAGNOSIS — M25572 Pain in left ankle and joints of left foot: Secondary | ICD-10-CM | POA: Diagnosis not present

## 2022-09-17 NOTE — Progress Notes (Unsigned)
Cardiology Office Note   Date:  09/20/2022   ID:  Emily Wagner, DOB [birth year], MRN [id number]  PCP:  [name], [name], MD  Cardiologist:   None   Chief Complaint  Patient presents with   Coronary Artery Disease        History of Present Illness: Emily Wagner is a 88 y.o. female who presents for evaluation of atrial fib.     She has a history of  bypass surgery.  She has had fluctuating BPs.  She has a diagnosis of post polio syndrome.   She has chronic fatigue.   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  She was hospitalized May of last year for this.   She had increased ventricular rate and so I added digoxin.  However, she has not felt wel since starting that medicine.    Since I last saw her she fell and broke her foot.  She has not had any new cardiac complaints. The patient denies any new symptoms such as chest discomfort, neck or arm discomfort. There has been no new shortness of breath, PND or orthopnea. There have been no reported palpitations, presyncope or syncope.   Of note she has had [facility] spotted fever 3 times since I last saw her.   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   LIMA to LAD   Stroke [facility])     Past Surgical History:  Procedure Laterality Date   ABDOMINAL HYSTERECTOMY     Cancer and endometriosis   CARDIAC CATHETERIZATION  2008   CARDIAC CATHETERIZATION N/A 10/29/2015   Procedure: Left Heart Cath and Coronary Angiography;  Surgeon: Belva Crome, MD;  Location: [place] [facility];  Service: Cardiovascular;  Laterality: N/A;   CATARACT EXTRACTION     CHOLECYSTECTOMY     CORONARY ARTERY BYPASS GRAFT N/A 11/02/2015   Procedure: OFF PUMP CORONARY ARTERY BYPASS GRAFTING (CABG) TIMES ONE;  Surgeon: Rexene Alberts, MD;  Location: [place];  Service: Open Heart Surgery;  Laterality: N/A;  LIMA to LAD   FEMUR FRACTURE SURGERY  09/2015   right hip compression screws    FRACTURE SURGERY     Right hip   HERNIA REPAIR     Right inguinal   TEE WITHOUT CARDIOVERSION N/A 11/02/2015   Procedure: TRANSESOPHAGEAL ECHOCARDIOGRAM (TEE);  Surgeon: Rexene Alberts, MD;  Location: [place];  Service: Open Heart Surgery;  Laterality: N/A;     Current Outpatient Medications  Medication Sig Dispense Refill   acetaminophen (TYLENOL) 325 MG tablet Take 325 mg by mouth every 6 (six) hours as needed.     apixaban (ELIQUIS) 2.5 MG TABS tablet Take 1 tablet (2.5 mg total) by mouth 2 (two) times daily. 60 tablet 6   cholecalciferol (VITAMIN D3) 25 MCG (1000 UNIT) tablet Take 1,000 Units by mouth daily.     Cyanocobalamin (VITAMIN B-12 PO) Take 500 mcg by  mouth. Take one tablet by mouth on Mon.,Wed., and Friday     digoxin (LANOXIN) 0.125 MG tablet Take 1 tablet (0.125 mg total) by mouth daily. 90 tablet 3   diltiazem (CARDIZEM) 30 MG tablet Take 1 tablet (30 mg total) by mouth 2 (two) times daily. 180 tablet 3   famotidine (PEPCID) 20 MG tablet Take 1 tablet (20 mg total) by mouth 2 (two) times daily. 180 tablet 1   hydrochlorothiazide (HYDRODIURIL) 25 MG tablet Take 1 tablet (25 mg total) by mouth daily. 90 tablet 1   levothyroxine (SYNTHROID) 50 MCG tablet Take 1 tablet (50 mcg total) by mouth daily before breakfast. 90 tablet 1   meclizine (ANTIVERT) 12.5 MG tablet Take 1 tablet (12.5 mg total) by mouth 3 (three) times daily as needed for dizziness. (Patient taking differently: Take 12.5 mg by mouth as needed for dizziness.) 30 tablet 0   Multiple  Vitamins-Minerals (PRESERVISION AREDS 2 PO) Take 1 capsule by mouth at bedtime.     ondansetron (ZOFRAN) 4 MG tablet Take 1 tablet (4 mg total) by mouth every 8 (eight) hours as needed for nausea or vomiting. 30 tablet 1   PARoxetine (PAXIL) 20 MG tablet Take 1 tablet (20 mg total) by mouth daily. 30 tablet 2   QUEtiapine (SEROQUEL) 50 MG tablet Take 1 tablet (50 mg total) by mouth at bedtime. 90 tablet 1   Current Facility-Administered Medications  Medication Dose Route Frequency Provider Last Rate Last Admin   methylPREDNISolone acetate (DEPO-MEDROL) injection 80 mg  80 mg Intramuscular Once [name], [name], MD        Allergies:   Nitrofurantoin, Nsaids, Other, Flonase [fluticasone propionate], Prevnar 13 [pneumococcal 13-val conj vacc], Alendronate sodium, Beta adrenergic blockers, Ciprofloxacin, Codeine, Crestor [rosuvastatin calcium], Darvon, Lipitor [atorvastatin calcium], Nortriptyline hcl, Potassium-containing compounds, Pravachol, Propranolol hcl, Sulfa antibiotics, Sulfamethoxazole, Ultram [tramadol hcl], Welchol [colesevelam hcl], Zetia [ezetimibe], and Zocor [simvastatin]    ROS:  Please see the history of present illness.   Otherwise, review of systems are positive for none.   All other systems are reviewed and negative.    PHYSICAL EXAM: VS:  BP 110/78   Pulse 81   Ht '5\' 4"'$  (1.626 m)   Wt 127 lb (57.6 kg)   LMP 03/21/1971   BMI 21.80 kg/m  , BMI Body mass index is 21.8 kg/m.  GENERAL:  Well appearing NECK:  No jugular venous distention, waveform within normal limits, carotid upstroke brisk and symmetric, no bruits, no thyromegaly LUNGS:  Clear to auscultation bilaterally CHEST:  Unremarkable HEART:  PMI not displaced or sustained,S1 and S2 within normal limits, no S3, no clicks, no rubs, no murmurs, irregular  ABD:  Flat, positive bowel sounds normal in frequency in pitch, no bruits, no rebound, no guarding, no midline pulsatile mass, no hepatomegaly, no  splenomegaly EXT:  2 plus pulses throughout, no edema, no cyanosis no clubbing   EKG:  EKG is  ordered today. Atrial fibrillation interventricular conduction delay, ST-T wave changes consistent with digoxin effect.  Rate 81 and improved compared to previous   Recent Labs: 06/13/2022: TSH 1.840 06/26/2022: ALT 10; BUN 24; Creatinine, Ser 1.25; Hemoglobin 10.7; Platelets 348; Potassium 4.0; Sodium 137    Lipid Panel    Component Value Date/Time   CHOL 215 (H) 03/15/2022 1429   TRIG 106 03/15/2022 1429   TRIG 162 (H) 06/24/2015 0918   HDL 44 03/15/2022 1429   HDL 44 06/24/2015 0918   CHOLHDL 4.9 (H) 03/15/2022 1429  CHOLHDL 5.4 10/31/2015 0215   VLDL 20 10/31/2015 0215   LDLCALC 152 (H) 03/15/2022 1429   LDLCALC 163 (H) 05/14/2014 1038      Wt Readings from Last 3 Encounters:  09/20/22 127 lb (57.6 kg)  08/17/22 130 lb (59 kg)  06/26/22 136 lb (61.7 kg)      Other studies Reviewed: Additional studies/ records that were reviewed today include:   Labs Review of the above records demonstrates:    See elsewhere  ASSESSMENT AND PLAN:   CAD:  The patient has no new sypmtoms.  No further cardiovascular testing is indicated.  We will continue with aggressive risk reduction and meds as listed.  ATRIAL FIB:      She has better rate control.  She tolerates anticoagulation but now that her weight is now below 60 kg she needs to be on 2 and half milligrams twice daily.   I will check a digoxin level.    DYSLIPIDEMIA:   She has been intolerant of statins and does not want other therapy.    HTN:  Her BP is at target.  No change in therapy.     Current medicines are reviewed at length with the patient today.  The patient does not have concerns regarding medicines.  The following changes have been made: As above  Labs/ tests ordered today include:       Orders Placed This Encounter  Procedures   Digoxin level   EKG 12-Lead     Disposition:   FU with me in 12  months   Signed, Minus Breeding, MD  09/20/2022 11:18 AM    [place]

## 2022-09-19 DIAGNOSIS — M25572 Pain in left ankle and joints of left foot: Secondary | ICD-10-CM | POA: Diagnosis not present

## 2022-09-20 ENCOUNTER — Other Ambulatory Visit: Payer: Self-pay | Admitting: *Deleted

## 2022-09-20 ENCOUNTER — Ambulatory Visit: Payer: Medicare Other | Admitting: Cardiology

## 2022-09-20 ENCOUNTER — Encounter: Payer: Self-pay | Admitting: Cardiology

## 2022-09-20 VITALS — BP 110/78 | HR 81 | Ht 64.0 in | Wt 127.0 lb

## 2022-09-20 DIAGNOSIS — Z79899 Other long term (current) drug therapy: Secondary | ICD-10-CM

## 2022-09-20 DIAGNOSIS — I10 Essential (primary) hypertension: Secondary | ICD-10-CM | POA: Diagnosis not present

## 2022-09-20 DIAGNOSIS — E785 Hyperlipidemia, unspecified: Secondary | ICD-10-CM

## 2022-09-20 DIAGNOSIS — I4819 Other persistent atrial fibrillation: Secondary | ICD-10-CM | POA: Diagnosis not present

## 2022-09-20 DIAGNOSIS — I2510 Atherosclerotic heart disease of native coronary artery without angina pectoris: Secondary | ICD-10-CM

## 2022-09-20 MED ORDER — APIXABAN 2.5 MG PO TABS: 2.5000 mg | ORAL_TABLET | Freq: Two times a day (BID) | ORAL | 6 refills | Status: DC

## 2022-09-20 NOTE — Patient Instructions (Addendum)
Medication Instructions:  Please decrease your Eliquis to 2.5 mg one tablet twice a day. Continue all other medications as listed.  *If you need a refill on your cardiac medications before your next appointment, please call your pharmacy*  Lab:  please have a Digoxin level drawn at your convenience at [facility].  Hold Digoxin this AM until after your blood work.  Follow-Up: At [facility], you and your health needs are our priority.  As part of our continuing mission to provide you with exceptional heart care, we have created designated Provider Care Teams.  These Care Teams include your primary Cardiologist (physician) and Advanced Practice Providers (APPs -  Physician Assistants and Nurse Practitioners) who all work together to provide you with the care you need, when you need it.  We recommend signing up for the patient portal called "MyChart".  Sign up information is provided on this After Visit Summary.  MyChart is used to connect with patients for Virtual Visits (Telemedicine).  Patients are able to view lab/test results, encounter notes, upcoming appointments, etc.  Non-urgent messages can be sent to your provider as well.   To learn more about what you can do with MyChart, go to [email or web address].    Your next appointment:   1 year(s)  The format for your next appointment:   In Person  Provider:   Minus Breeding, MD     Important Information About Sugar

## 2022-09-21 ENCOUNTER — Telehealth: Payer: Self-pay | Admitting: Family Medicine

## 2022-09-21 ENCOUNTER — Other Ambulatory Visit: Payer: Medicare Other

## 2022-09-21 ENCOUNTER — Other Ambulatory Visit: Payer: Self-pay

## 2022-09-21 DIAGNOSIS — I10 Essential (primary) hypertension: Secondary | ICD-10-CM

## 2022-09-21 DIAGNOSIS — Z79899 Other long term (current) drug therapy: Secondary | ICD-10-CM | POA: Diagnosis not present

## 2022-09-21 DIAGNOSIS — I4819 Other persistent atrial fibrillation: Secondary | ICD-10-CM | POA: Diagnosis not present

## 2022-09-21 NOTE — Telephone Encounter (Signed)
I think that she is due for a CBC a CMP and a lipid, make sure she has all those 3 between his blood work and mine

## 2022-09-21 NOTE — Telephone Encounter (Signed)
Labs have been added to future orders

## 2022-09-21 NOTE — Telephone Encounter (Signed)
[name],  Do you want to add anything. May be an CBC?

## 2022-09-22 ENCOUNTER — Other Ambulatory Visit: Payer: Self-pay | Admitting: *Deleted

## 2022-09-22 ENCOUNTER — Telehealth: Payer: Self-pay | Admitting: *Deleted

## 2022-09-22 DIAGNOSIS — I4819 Other persistent atrial fibrillation: Secondary | ICD-10-CM

## 2022-09-22 DIAGNOSIS — S8262XD Displaced fracture of lateral malleolus of left fibula, subsequent encounter for closed fracture with routine healing: Secondary | ICD-10-CM | POA: Diagnosis not present

## 2022-09-22 DIAGNOSIS — Z79899 Other long term (current) drug therapy: Secondary | ICD-10-CM

## 2022-09-22 DIAGNOSIS — E039 Hypothyroidism, unspecified: Secondary | ICD-10-CM | POA: Diagnosis not present

## 2022-09-22 DIAGNOSIS — Z951 Presence of aortocoronary bypass graft: Secondary | ICD-10-CM | POA: Diagnosis not present

## 2022-09-22 DIAGNOSIS — I10 Essential (primary) hypertension: Secondary | ICD-10-CM | POA: Diagnosis not present

## 2022-09-22 LAB — DIGOXIN LEVEL: Digoxin, Serum: 1.4 ng/mL — ABNORMAL HIGH (ref 0.5–0.9)

## 2022-09-22 NOTE — Telephone Encounter (Signed)
Dig level is slightly elevated.  Ask her to change this to every other day or 1/2 pill per day if she prefers.  She should let us know what she wants to do and then we can measure a level in two weeks.   Call Ms. Martinez with the results and send results to [name], [name], MD   Pt is aware of results and orders.  She will take 1 tablet every other day and repeat back on November 10 at [facility].

## 2022-09-26 ENCOUNTER — Other Ambulatory Visit: Payer: Self-pay | Admitting: Family Medicine

## 2022-09-26 DIAGNOSIS — I10 Essential (primary) hypertension: Secondary | ICD-10-CM | POA: Diagnosis not present

## 2022-09-26 DIAGNOSIS — Z951 Presence of aortocoronary bypass graft: Secondary | ICD-10-CM | POA: Diagnosis not present

## 2022-09-26 DIAGNOSIS — S8262XD Displaced fracture of lateral malleolus of left fibula, subsequent encounter for closed fracture with routine healing: Secondary | ICD-10-CM | POA: Diagnosis not present

## 2022-09-26 DIAGNOSIS — F4323 Adjustment disorder with mixed anxiety and depressed mood: Secondary | ICD-10-CM

## 2022-09-26 DIAGNOSIS — E039 Hypothyroidism, unspecified: Secondary | ICD-10-CM | POA: Diagnosis not present

## 2022-09-28 DIAGNOSIS — Z951 Presence of aortocoronary bypass graft: Secondary | ICD-10-CM | POA: Diagnosis not present

## 2022-09-28 DIAGNOSIS — E039 Hypothyroidism, unspecified: Secondary | ICD-10-CM | POA: Diagnosis not present

## 2022-09-28 DIAGNOSIS — S8262XD Displaced fracture of lateral malleolus of left fibula, subsequent encounter for closed fracture with routine healing: Secondary | ICD-10-CM | POA: Diagnosis not present

## 2022-09-28 DIAGNOSIS — I10 Essential (primary) hypertension: Secondary | ICD-10-CM | POA: Diagnosis not present

## 2022-10-02 DIAGNOSIS — Z951 Presence of aortocoronary bypass graft: Secondary | ICD-10-CM | POA: Diagnosis not present

## 2022-10-02 DIAGNOSIS — E039 Hypothyroidism, unspecified: Secondary | ICD-10-CM | POA: Diagnosis not present

## 2022-10-02 DIAGNOSIS — S8262XD Displaced fracture of lateral malleolus of left fibula, subsequent encounter for closed fracture with routine healing: Secondary | ICD-10-CM | POA: Diagnosis not present

## 2022-10-02 DIAGNOSIS — I10 Essential (primary) hypertension: Secondary | ICD-10-CM | POA: Diagnosis not present

## 2022-10-03 DIAGNOSIS — M79676 Pain in unspecified toe(s): Secondary | ICD-10-CM | POA: Diagnosis not present

## 2022-10-03 DIAGNOSIS — B351 Tinea unguium: Secondary | ICD-10-CM | POA: Diagnosis not present

## 2022-10-06 ENCOUNTER — Other Ambulatory Visit: Payer: Medicare Other

## 2022-10-06 DIAGNOSIS — I4819 Other persistent atrial fibrillation: Secondary | ICD-10-CM | POA: Diagnosis not present

## 2022-10-06 DIAGNOSIS — I10 Essential (primary) hypertension: Secondary | ICD-10-CM

## 2022-10-06 DIAGNOSIS — Z79899 Other long term (current) drug therapy: Secondary | ICD-10-CM | POA: Diagnosis not present

## 2022-10-07 LAB — CBC WITH DIFFERENTIAL/PLATELET
Basophils Absolute: 0 10*3/uL (ref 0.0–0.2)
Basos: 1 %
EOS (ABSOLUTE): 0.2 10*3/uL (ref 0.0–0.4)
Eos: 2 %
Hematocrit: 35.8 % (ref 34.0–46.6)
Hemoglobin: 11 g/dL — ABNORMAL LOW (ref 11.1–15.9)
Immature Grans (Abs): 0 10*3/uL (ref 0.0–0.1)
Immature Granulocytes: 0 %
Lymphocytes Absolute: 1 10*3/uL (ref 0.7–3.1)
Lymphs: 15 %
MCH: 26 pg — ABNORMAL LOW (ref 26.6–33.0)
MCHC: 30.7 g/dL — ABNORMAL LOW (ref 31.5–35.7)
MCV: 85 fL (ref 79–97)
Monocytes Absolute: 0.6 10*3/uL (ref 0.1–0.9)
Monocytes: 9 %
Neutrophils Absolute: 5 10*3/uL (ref 1.4–7.0)
Neutrophils: 73 %
Platelets: 324 10*3/uL (ref 150–450)
RBC: 4.23 x10E6/uL (ref 3.77–5.28)
RDW: 15 % (ref 11.7–15.4)
WBC: 6.8 10*3/uL (ref 3.4–10.8)

## 2022-10-07 LAB — LIPID PANEL
Chol/HDL Ratio: 5.5 ratio — ABNORMAL HIGH (ref 0.0–4.4)
Cholesterol, Total: 198 mg/dL (ref 100–199)
HDL: 36 mg/dL — ABNORMAL LOW (ref >39)
LDL Chol Calc (NIH): 130 mg/dL — ABNORMAL HIGH (ref 0–99)
Triglycerides: 176 mg/dL — ABNORMAL HIGH (ref 0–149)
VLDL Cholesterol Cal: 32 mg/dL (ref 5–40)

## 2022-10-07 LAB — CMP14+EGFR
ALT: 11 IU/L (ref 0–32)
AST: 20 IU/L (ref 0–40)
Albumin/Globulin Ratio: 1.2 (ref 1.2–2.2)
Albumin: 3.8 g/dL (ref 3.7–4.7)
Alkaline Phosphatase: 117 IU/L (ref 44–121)
BUN/Creatinine Ratio: 21 (ref 12–28)
BUN: 26 mg/dL (ref 8–27)
Bilirubin Total: 0.4 mg/dL (ref 0.0–1.2)
CO2: 25 mmol/L (ref 20–29)
Calcium: 9.4 mg/dL (ref 8.7–10.3)
Chloride: 99 mmol/L (ref 96–106)
Creatinine, Ser: 1.22 mg/dL — ABNORMAL HIGH (ref 0.57–1.00)
Globulin, Total: 3.1 g/dL (ref 1.5–4.5)
Glucose: 130 mg/dL — ABNORMAL HIGH (ref 70–99)
Potassium: 4 mmol/L (ref 3.5–5.2)
Sodium: 139 mmol/L (ref 134–144)
Total Protein: 6.9 g/dL (ref 6.0–8.5)
eGFR: 43 mL/min/{1.73_m2} — ABNORMAL LOW (ref >59)

## 2022-10-07 LAB — DIGOXIN LEVEL: Digoxin, Serum: 0.8 ng/mL (ref 0.5–0.9)

## 2022-10-08 ENCOUNTER — Emergency Department (HOSPITAL_COMMUNITY): Payer: No Typology Code available for payment source

## 2022-10-08 ENCOUNTER — Encounter (HOSPITAL_COMMUNITY): Payer: Self-pay | Admitting: *Deleted

## 2022-10-08 ENCOUNTER — Emergency Department (HOSPITAL_COMMUNITY)
Admission: EM | Admit: 2022-10-08 | Discharge: 2022-10-08 | Disposition: A | Discharge status: Home / Self Care | Payer: No Typology Code available for payment source | Attending: Emergency Medicine | Admitting: Emergency Medicine

## 2022-10-08 ENCOUNTER — Other Ambulatory Visit: Payer: Self-pay

## 2022-10-08 DIAGNOSIS — Z743 Need for continuous supervision: Secondary | ICD-10-CM | POA: Diagnosis not present

## 2022-10-08 DIAGNOSIS — R9431 Abnormal electrocardiogram [ECG] [EKG]: Secondary | ICD-10-CM | POA: Diagnosis not present

## 2022-10-08 DIAGNOSIS — S022XXA Fracture of nasal bones, initial encounter for closed fracture: Secondary | ICD-10-CM | POA: Diagnosis not present

## 2022-10-08 DIAGNOSIS — Z7901 Long term (current) use of anticoagulants: Secondary | ICD-10-CM | POA: Diagnosis not present

## 2022-10-08 DIAGNOSIS — Z79899 Other long term (current) drug therapy: Secondary | ICD-10-CM | POA: Insufficient documentation

## 2022-10-08 DIAGNOSIS — Y92480 Sidewalk as the place of occurrence of the external cause: Secondary | ICD-10-CM | POA: Diagnosis not present

## 2022-10-08 DIAGNOSIS — S01511A Laceration without foreign body of lip, initial encounter: Secondary | ICD-10-CM | POA: Diagnosis not present

## 2022-10-08 DIAGNOSIS — I4891 Unspecified atrial fibrillation: Secondary | ICD-10-CM | POA: Insufficient documentation

## 2022-10-08 DIAGNOSIS — S0181XA Laceration without foreign body of other part of head, initial encounter: Secondary | ICD-10-CM | POA: Diagnosis not present

## 2022-10-08 DIAGNOSIS — I2510 Atherosclerotic heart disease of native coronary artery without angina pectoris: Secondary | ICD-10-CM | POA: Diagnosis not present

## 2022-10-08 DIAGNOSIS — R0689 Other abnormalities of breathing: Secondary | ICD-10-CM | POA: Diagnosis not present

## 2022-10-08 DIAGNOSIS — I6782 Cerebral ischemia: Secondary | ICD-10-CM | POA: Diagnosis not present

## 2022-10-08 DIAGNOSIS — S0990XA Unspecified injury of head, initial encounter: Secondary | ICD-10-CM | POA: Diagnosis not present

## 2022-10-08 DIAGNOSIS — J320 Chronic maxillary sinusitis: Secondary | ICD-10-CM | POA: Diagnosis not present

## 2022-10-08 DIAGNOSIS — S0992XA Unspecified injury of nose, initial encounter: Secondary | ICD-10-CM | POA: Diagnosis present

## 2022-10-08 DIAGNOSIS — W010XXA Fall on same level from slipping, tripping and stumbling without subsequent striking against object, initial encounter: Secondary | ICD-10-CM | POA: Insufficient documentation

## 2022-10-08 DIAGNOSIS — S0003XA Contusion of scalp, initial encounter: Secondary | ICD-10-CM | POA: Diagnosis not present

## 2022-10-08 DIAGNOSIS — R52 Pain, unspecified: Secondary | ICD-10-CM | POA: Diagnosis not present

## 2022-10-08 DIAGNOSIS — G9389 Other specified disorders of brain: Secondary | ICD-10-CM | POA: Diagnosis not present

## 2022-10-08 DIAGNOSIS — R6889 Other general symptoms and signs: Secondary | ICD-10-CM | POA: Diagnosis not present

## 2022-10-08 DIAGNOSIS — W19XXXA Unspecified fall, initial encounter: Secondary | ICD-10-CM

## 2022-10-08 DIAGNOSIS — I639 Cerebral infarction, unspecified: Secondary | ICD-10-CM | POA: Diagnosis not present

## 2022-10-08 DIAGNOSIS — T699XXA Effect of reduced temperature, unspecified, initial encounter: Secondary | ICD-10-CM | POA: Diagnosis not present

## 2022-10-08 LAB — TROPONIN I (HIGH SENSITIVITY): Troponin I (High Sensitivity): 14 ng/L (ref <18)

## 2022-10-08 LAB — COMPREHENSIVE METABOLIC PANEL
ALT: 13 U/L (ref 0–44)
AST: 20 U/L (ref 15–41)
Albumin: 3.5 g/dL (ref 3.5–5.0)
Alkaline Phosphatase: 93 U/L (ref 38–126)
Anion gap: 9 (ref 5–15)
BUN: 23 mg/dL (ref 8–23)
CO2: 27 mmol/L (ref 22–32)
Calcium: 9.1 mg/dL (ref 8.9–10.3)
Chloride: 103 mmol/L (ref 98–111)
Creatinine, Ser: 1.03 mg/dL — ABNORMAL HIGH (ref 0.44–1.00)
GFR, Estimated: 53 mL/min — ABNORMAL LOW (ref >60)
Glucose, Bld: 122 mg/dL — ABNORMAL HIGH (ref 70–99)
Potassium: 4.3 mmol/L (ref 3.5–5.1)
Sodium: 139 mmol/L (ref 135–145)
Total Bilirubin: 0.6 mg/dL (ref 0.3–1.2)
Total Protein: 7 g/dL (ref 6.5–8.1)

## 2022-10-08 LAB — CBC WITH DIFFERENTIAL/PLATELET
Abs Immature Granulocytes: 0.03 10*3/uL (ref 0.00–0.07)
Basophils Absolute: 0 10*3/uL (ref 0.0–0.1)
Basophils Relative: 0 %
Eosinophils Absolute: 0.1 10*3/uL (ref 0.0–0.5)
Eosinophils Relative: 1 %
HCT: 34.1 % — ABNORMAL LOW (ref 36.0–46.0)
Hemoglobin: 10.7 g/dL — ABNORMAL LOW (ref 12.0–15.0)
Immature Granulocytes: 0 %
Lymphocytes Relative: 10 %
Lymphs Abs: 1 10*3/uL (ref 0.7–4.0)
MCH: 27.4 pg (ref 26.0–34.0)
MCHC: 31.4 g/dL (ref 30.0–36.0)
MCV: 87.4 fL (ref 80.0–100.0)
Monocytes Absolute: 0.6 10*3/uL (ref 0.1–1.0)
Monocytes Relative: 6 %
Neutro Abs: 8.7 10*3/uL — ABNORMAL HIGH (ref 1.7–7.7)
Neutrophils Relative %: 83 %
Platelets: 261 10*3/uL (ref 150–400)
RBC: 3.9 MIL/uL (ref 3.87–5.11)
RDW: 16.7 % — ABNORMAL HIGH (ref 11.5–15.5)
WBC: 10.5 10*3/uL (ref 4.0–10.5)
nRBC: 0 % (ref 0.0–0.2)

## 2022-10-08 LAB — MAGNESIUM: Magnesium: 2 mg/dL (ref 1.7–2.4)

## 2022-10-08 MED ORDER — LIDOCAINE-EPINEPHRINE-TETRACAINE (LET) TOPICAL GEL
3.0000 mL | Freq: Once | TOPICAL | Status: AC
  Administered 2022-10-08: 3 mL via TOPICAL
  Filled 2022-10-08: qty 3

## 2022-10-08 MED ORDER — ACETAMINOPHEN 500 MG PO TABS
1000.0000 mg | ORAL_TABLET | Freq: Once | ORAL | Status: AC
  Administered 2022-10-08: 1000 mg via ORAL
  Filled 2022-10-08: qty 2

## 2022-10-08 MED ORDER — LIDOCAINE-EPINEPHRINE (PF) 2 %-1:200000 IJ SOLN
20.0000 mL | Freq: Once | INTRAMUSCULAR | Status: AC
  Administered 2022-10-08: 20 mL
  Filled 2022-10-08: qty 20

## 2022-10-08 MED ORDER — BACITRACIN ZINC 500 UNIT/GM EX OINT
TOPICAL_OINTMENT | CUTANEOUS | Status: AC
  Administered 2022-10-08: 2
  Filled 2022-10-08: qty 1.8

## 2022-10-08 NOTE — ED Triage Notes (Signed)
Pt states she was walking and tripped over her boot causing her to land on her face; pt has bruising to forehead and nose and has blood around mouth; pt denies any loc and has hematoma to forehead  Pt on eliquis

## 2022-10-08 NOTE — Discharge Instructions (Addendum)
Your fall is overall reassuring.  CT scan was negative for any acute intracranial injury related to fall.  CT of your face did reveal that you have a fracture of both nasal bones.  Recommend that you follow-up with ENT.  Recommend you take Tylenol as needed for pain.  If you have new facial droop, slurred speech, numbness or tingling of your extremities, cannot breathe or chest pain please return to the emergency department for further evaluation.  Your laceration repairs:  Sutured repair Keep the laceration site dry for the next 24 hours and leave the dressing in place. After 24 hours you may remove the dressing and gently clean the laceration site with antibacterial soap and warm water. Do not scrub the area. Do not soak the area and water for long periods of time. Don't use hydrogen peroxide, iodine-based solutions, or alcohol, which can slow healing, and will probably be painful! Apply topical bacitracin 1-2 times per day for the next 3-5 days.  Please see PCP 5 to 7 days for removal of the sutures.  You should return sooner for any signs of infection which would include increased redness around the wound, increased swelling, new drainage of yellow pus.    Adhesive tape repair Tape strips have the advantage of being less painful to apply and lower risk of infection, but do require more caution on your part, as they are more fragile than other skin closure techniques.  It's important to: -Keep the tape clean and dry -Avoid picking at the tape or rubbing the area -Avoid soaking in water (showering is okay-bathing is not) -The tape strips will fall off on their own in about 5-7 days (if they don't, you can gently remove them or soak the wound in water at this time to loosen them).  At this time, scar tissue will be forming under the surface of the wound and your body will do the rest of the work of healing.

## 2022-10-08 NOTE — ED Provider Notes (Signed)
[facility] EMERGENCY DEPARTMENT Provider Note   CSN: [id number] Arrival date & time: 10/08/22  1224     History  Chief Complaint  Patient presents with   Fall   HPI Emily Wagner is a 88 y.o. female with CAD, atrial fibrillation, stroke presenting for fall.  Fall occurred this morning at church. States she was walking on flat concrete sidewalk and tripped over a piece of the concrete with her ankle brace. Patient states she fell "flat on her face".  Denies loss of consciousness but does not endorse any headache.  Patient states that she is on Eliquis.  Denies pain to the hips, extremities.  Denies chest pain, shortness of breath and abdominal pain.  She states that at baseline she normally walks with a cane or walker.  States that since her recent ankle fracture has been more difficult to ambulate.    Fall Associated symptoms include headaches.       Home Medications Prior to Admission medications   Medication Sig Start Date End Date Taking? Authorizing Provider  acetaminophen (TYLENOL) 325 MG tablet Take 325 mg by mouth every 6 (six) hours as needed.    [provider]  apixaban (ELIQUIS) 2.5 MG TABS tablet Take 1 tablet (2.5 mg total) by mouth 2 (two) times daily. 09/20/22   Minus Breeding, MD  cholecalciferol (VITAMIN D3) 25 MCG (1000 UNIT) tablet Take 1,000 Units by mouth daily.    [provider]  Cyanocobalamin (VITAMIN B-12 PO) Take 500 mcg by mouth. Take one tablet by mouth on Mon.,Wed., and Friday    [provider]  digoxin (LANOXIN) 0.125 MG tablet Take 1 tablet (0.125 mg total) by mouth daily. Patient taking differently: Take 0.125 mg by mouth every other day. 05/24/22   Minus Breeding, MD  diltiazem (CARDIZEM) 30 MG tablet Take 1 tablet (30 mg total) by mouth 2 (two) times daily. 02/15/22   Minus Breeding, MD  famotidine (PEPCID) 20 MG tablet Take 1 tablet (20 mg total) by mouth 2 (two) times daily. 03/21/22   [name], [name], MD   hydrochlorothiazide (HYDRODIURIL) 25 MG tablet Take 1 tablet (25 mg total) by mouth daily. 03/21/22   [name], [name], MD  levothyroxine (SYNTHROID) 50 MCG tablet Take 1 tablet (50 mcg total) by mouth daily before breakfast. 03/21/22   [name], [name], MD  meclizine (ANTIVERT) 12.5 MG tablet Take 1 tablet (12.5 mg total) by mouth 3 (three) times daily as needed for dizziness. Patient taking differently: Take 12.5 mg by mouth as needed for dizziness. 11/29/21   Loman Brooklyn, FNP  Multiple Vitamins-Minerals (PRESERVISION AREDS 2 PO) Take 1 capsule by mouth at bedtime.    [provider]  ondansetron (ZOFRAN) 4 MG tablet Take 1 tablet (4 mg total) by mouth every 8 (eight) hours as needed for nausea or vomiting. 07/07/22   [name], [name], MD  PARoxetine (PAXIL) 20 MG tablet TAKE 1 TABLET BY MOUTH EVERY DAY 09/26/22   [name], [name], MD  QUEtiapine (SEROQUEL) 50 MG tablet Take 1 tablet (50 mg total) by mouth at bedtime. 03/21/22   [name], [name], MD      Allergies    Nitrofurantoin, Nsaids, Other, Flonase [fluticasone propionate], Prevnar 13 [pneumococcal 13-val conj vacc], Alendronate sodium, Beta adrenergic blockers, Ciprofloxacin, Codeine, Crestor [rosuvastatin calcium], Darvon, Lipitor [atorvastatin calcium], Nortriptyline hcl, Potassium-containing compounds, Pravachol, Propranolol hcl, Sulfa antibiotics, Sulfamethoxazole, Ultram [tramadol hcl], Welchol [colesevelam hcl], Zetia [ezetimibe], and Zocor [simvastatin]    Review of  Systems   Review of Systems  Skin:  Positive for wound.  Neurological:  Positive for headaches.    Physical Exam Updated Vital Signs BP 129/81 (BP Location: Left Arm)   Pulse 81   Temp 98.1 F (36.7 C) (Oral)   Resp 19   Ht '5\' 4"'$  (1.626 m)   Wt 57.6 kg   LMP 03/21/1971   SpO2 98%   BMI 21.80 kg/m  Physical Exam Vitals and nursing note reviewed.  HENT:     Head: Normocephalic and atraumatic.     Comments: New abrasion noted to  the middle of the forehead.  Bleeding is minimal.  Abrasion is approximately 2 cm in length and well approximated.  Large area of ecchymosis and protrusion of the skin at the middle of the forehead.  Small laceration noted about the midportion of the upper lip.  Left and right nares appear patent with dried blood noted  Inspection of the mouth: Dried blood noted about the front teeth.  Teeth appear grossly normal.  Posterior pharynx appears normal.    Mouth/Throat:     Mouth: Mucous membranes are moist.  Eyes:     General:        Right eye: No discharge.        Left eye: No discharge.     Conjunctiva/sclera: Conjunctivae normal.  Cardiovascular:     Rate and Rhythm: Normal rate. Rhythm irregular.     Pulses: Normal pulses.     Heart sounds: Normal heart sounds.  Pulmonary:     Effort: Pulmonary effort is normal.     Breath sounds: Normal breath sounds.  Abdominal:     General: Abdomen is flat.     Palpations: Abdomen is soft.  Skin:    General: Skin is warm and dry.  Neurological:     General: No focal deficit present.     Comments: GCS 15. Speech is goal oriented. No deficits appreciated to CN III-XII; symmetric eyebrow raise, no facial drooping, tongue midline. Patient has equal grip strength bilaterally with 5/5 strength against resistance in all major muscle groups bilaterally. Sensation to light touch intact. Patient moves extremities without ataxia. Normal finger-nose-finger.  Did not assess gait.   Psychiatric:        Mood and Affect: Mood normal.     ED Results / Procedures / Treatments   Labs (all labs ordered are listed, but only abnormal results are displayed) Labs Reviewed  COMPREHENSIVE METABOLIC PANEL - Abnormal; Notable for the following components:      Result Value   Glucose, Bld 122 (*)    Creatinine, Ser 1.03 (*)    GFR, Estimated 53 (*)    All other components within normal limits  CBC WITH DIFFERENTIAL/PLATELET - Abnormal; Notable for the following  components:   Hemoglobin 10.7 (*)    HCT 34.1 (*)    RDW 16.7 (*)    Neutro Abs 8.7 (*)    All other components within normal limits  MAGNESIUM  TROPONIN I (HIGH SENSITIVITY)    EKG EKG Interpretation  Date/Time:  Sunday October 08 2022 12:36:44 EST Ventricular Rate:  87 PR Interval:    QRS Duration: 122 QT Interval:  399 QTC Calculation: 480 R Axis:   63 Text Interpretation: Atrial fibrillation Left bundle branch block LBBB not present on prior EKGs Confirmed by Cindee Lame [phone]) on 10/08/2022 4:37:54 PM  Radiology CT Maxillofacial WO CM  Result Date: 10/08/2022 CLINICAL DATA:  Facial trauma. EXAM: CT MAXILLOFACIAL WITHOUT CONTRAST  TECHNIQUE: Multidetector CT imaging of the maxillofacial structures was performed. Multiplanar CT image reconstructions were also generated. RADIATION DOSE REDUCTION: This exam was performed according to the departmental dose-optimization program which includes automated exposure control, adjustment of the mA and/or kV according to patient size and/or use of iterative reconstruction technique. COMPARISON:  Multiple exams, including Head CT 10/08/2022 FINDINGS: Osseous: Fractures of both nasal bones are observed but only very minimally displaced for example on image 38 of series 3. No orbital fracture or other facial fracture is identified. Orbits: Unremarkable Sinuses: Chronic bilateral maxillary, ethmoid, and sphenoid sinusitis noted with air-fluid levels in both maxillary sinuses suspicious for acute bilateral maxillary sinusitis. Soft tissues: Chronic bifrontal scalp hematoma along the forehead as on image 14 series 2. Soft tissue swelling tracks along the nose, left greater than right. Bilateral common carotid atherosclerotic calcification. Limited intracranial: Right temporal lobe encephalomalacia. IMPRESSION: 1. Fractures of both nasal bones are observed but only very minimally displaced. 2. Chronic bifrontal scalp hematoma. 3. Chronic bilateral  maxillary, ethmoid, and sphenoid sinusitis with air-fluid levels in both maxillary sinuses suspicious for acute bilateral maxillary sinusitis. 4. Chronic right temporal lobe encephalomalacia. 5. Carotid atherosclerosis. Electronically Signed   By: Van Clines M.D.   On: 10/08/2022 15:26   CT Head Wo Contrast  Result Date: 10/08/2022 CLINICAL DATA:  Trauma with swelling about the forehead. On blood thinners. EXAM: CT HEAD WITHOUT CONTRAST TECHNIQUE: Contiguous axial images were obtained from the base of the skull through the vertex without intravenous contrast. RADIATION DOSE REDUCTION: This exam was performed according to the departmental dose-optimization program which includes automated exposure control, adjustment of the mA and/or kV according to patient size and/or use of iterative reconstruction technique. COMPARISON:  03/27/2021 FINDINGS: Brain: Expected cerebral and cerebellar atrophy for age. Moderate low density in the periventricular white matter likely related to small vessel disease. Remote right temporal lobe infarct with resultant encephalomalacia. No mass lesion, hemorrhage, hydrocephalus, acute infarct, intra-axial, or extra-axial fluid collection. Vascular: Intracranial atherosclerosis. No hyperdense vessel or unexpected calcification. Skull: Midline frontal scalp soft tissue swelling. No skull fracture. Sinuses/Orbits: Normal imaged portions of the orbits and globes. Both nasal bones are minimally subluxed medially including on 04/02. Ethmoid air cell mucosal thickening. Complex fluid, likely hemorrhage within both maxillary sinuses. Hypoplastic frontal sinuses. Clear mastoid air cells. Other: None. IMPRESSION: 1. No acute intracranial abnormality. Cerebral atrophy and small vessel ischemic change. 2. Frontal scalp soft tissue swelling. Subluxation of both nasal bones medially. Complex fluid, likely hemorrhage in both maxillary sinuses Electronically Signed   By: Abigail Miyamoto M.D.   On:  10/08/2022 13:24    Procedures .Marland KitchenLaceration Repair  Date/Time: 10/08/2022 8:33 PM  Performed by: Harriet Pho, PA-C Authorized by: Harriet Pho, PA-C   Consent:    Consent obtained:  Verbal   Consent given by:  Patient   Risks discussed:  Retained foreign body and infection Universal protocol:    Procedure explained and questions answered to patient or proxy's satisfaction: yes     Patient identity confirmed:  Verbally with patient Anesthesia:    Anesthesia method:  Local infiltration and topical application   Topical anesthetic:  LET   Local anesthetic:  Lidocaine 2% WITH epi Laceration details:    Location:  Face   Face location:  Forehead   Length (cm):  1.5 Pre-procedure details:    Preparation:  Patient was prepped and draped in usual sterile fashion Exploration:    Limited defect created (wound extended): no  Imaging outcome: foreign body not noted     Wound extent: areolar tissue violated   Treatment:    Area cleansed with:  Saline   Amount of cleaning:  Extensive   Irrigation solution:  Sterile saline   Irrigation method:  Pressure wash   Debridement:  None   Undermining:  None Skin repair:    Repair method:  Staples   Number of staples:  3 Approximation:    Approximation:  Close Repair type:    Repair type:  Simple Post-procedure details:    Dressing:  Antibiotic ointment and non-adherent dressing   Procedure completion:  Tolerated .Marland KitchenLaceration Repair  Date/Time: 10/08/2022 8:36 PM  Performed by: Harriet Pho, PA-C Authorized by: Harriet Pho, PA-C   Consent:    Consent obtained:  Verbal   Consent given by:  Patient   Risks discussed:  Retained foreign body and infection Universal protocol:    Procedure explained and questions answered to patient or proxy's satisfaction: yes     Patient identity confirmed:  Verbally with patient and arm band Anesthesia:    Anesthesia method:  Topical application and local infiltration   Topical  anesthetic:  LET   Local anesthetic:  Lidocaine 2% WITH epi Laceration details:    Location:  Lip   Length (cm):  1.5   Depth (mm):  5 Pre-procedure details:    Preparation:  Patient was prepped and draped in usual sterile fashion Exploration:    Hemostasis achieved with:  Direct pressure   Imaging outcome: foreign body not noted     Wound extent: areolar tissue violated   Treatment:    Area cleansed with:  Saline   Amount of cleaning:  Standard   Irrigation method:  Pressure wash   Visualized foreign bodies/material removed: no     Debridement:  None   Undermining:  None Skin repair:    Repair method:  Sutures   Suture size:  4-0   Suture material:  Prolene   Number of sutures:  3 Approximation:    Approximation:  Close Repair type:    Repair type:  Simple Post-procedure details:    Dressing:  Non-adherent dressing and antibiotic ointment   Procedure completion:  Tolerated well, no immediate complications     Medications Ordered in ED Medications  acetaminophen (TYLENOL) tablet 1,000 mg (1,000 mg Oral Given 10/08/22 1353)  lidocaine-EPINEPHrine-tetracaine (LET) topical gel (3 mLs Topical Given 10/08/22 1750)  lidocaine-EPINEPHrine (XYLOCAINE W/EPI) 2 %-1:200000 (PF) injection 20 mL (20 mLs Infiltration Given 10/08/22 1821)  bacitracin 500 UNIT/GM ointment (2 Applications  Given 25/[birth year]39 1951)    ED Course/ Medical Decision Making/ A&P                           Medical Decision Making Amount and/or Complexity of Data Reviewed Labs: ordered. Radiology: ordered.  Risk OTC drugs. Prescription drug management.   This patient presents to the ED for concern of fall, this involves a number of treatment options, and is a complaint that carries with it a high risk of complications and morbidity.  The differential diagnosis includes ICH, skull fracture, and symptomatic anemia.   Co morbidities: Discussed in HPI    EMR reviewed including pt PMHx, past surgical  history and past visits to ER.   See HPI for more details   Lab Tests:   I ordered and independently interpreted labs. Labs notable for hyperglycemia, anemia   Imaging Studies:  Abnormal findings. I  personally reviewed all imaging studies. Imaging notable for fractures of both nasal bones    Cardiac Monitoring:  The patient was maintained on a cardiac monitor.  I personally viewed and interpreted the cardiac monitored which showed an underlying rhythm of: Atrial fibrillation EKG non-ischemic, EKG did reveal concern for new left bundle branch block   Medicines ordered:  I ordered medication including Tylenol for pain Reevaluation of the patient after these medicines showed that the patient improved I have reviewed the patients home medicines and have made adjustments as needed     Consults/Attending Physician   I discussed this case with my attending physician who cosigned this note including patient's presenting symptoms, physical exam, and planned diagnostics and interventions. Attending physician stated agreement with plan or made changes to plan which were implemented.   Reevaluation:  After the interventions noted above I re-evaluated patient and found that they have :improved   Problem List / ED Course: Patient presented for mechanical fall.  Patient is on Eliquis.  Initial concern was ICH given patient on blood thinner.  Neuro exam was reassuring.  Doubt ICH given reassuring head CT.  Maxillofacial CT did reveal nasal bone fracture.  Per review of the radiologist, nasal bones are only mildly displaced.  Advised patient to follow-up with ENT and discussed return precautions regarding her nasal bone fracture.  Laceration repairs of her forehead and lip were overall well-tolerated.  Advised that she follow-up with her PCP for wound reevaluation and possible suture removal.  Treat her pain with Tylenol.  Also noted on her EKG that she has a new left bundle branch block.   Advised patient to follow-up with her cardiologist who she sees on a regular basis for her atrial fibrillation with this new finding.   Dispostion:  After consideration of the diagnostic results and the patients response to treatment, I feel that the patent would benefit from discharge home with follow-up with ENT for new nasal bone fractures and follow-up with cardiology for newly discovered left bundle branch block on EKG.         Final Clinical Impression(s) / ED Diagnoses Final diagnoses:  Fall, initial encounter  Closed fracture of nasal bone, initial encounter  Lip laceration, initial encounter  Laceration of forehead, initial encounter    Rx / DC Orders ED Discharge Orders     None         Harriet Pho, PA-C 10/08/22 2042    Audley Hose, MD 11/01/22 1105

## 2022-10-13 ENCOUNTER — Telehealth: Payer: Self-pay | Admitting: Family Medicine

## 2022-10-13 ENCOUNTER — Other Ambulatory Visit: Payer: Self-pay

## 2022-10-13 DIAGNOSIS — I4819 Other persistent atrial fibrillation: Secondary | ICD-10-CM

## 2022-10-13 MED ORDER — APIXABAN 2.5 MG PO TABS: 2.5000 mg | ORAL_TABLET | Freq: Two times a day (BID) | ORAL | 1 refills | Status: DC

## 2022-10-13 NOTE — Telephone Encounter (Signed)
Prescription refill request for Eliquis received. Indication:  Afib  Last office visit: 09/20/22 (Hochrein)  Scr: 1.03 (10/08/22)  Age: 88 Weight: 57.6kg  Appropriate dose and refill sent to requested pharmacy.

## 2022-10-13 NOTE — Telephone Encounter (Signed)
Patient wants to talk to Dettingers nurse about fall she had on 11/12. Please call back

## 2022-10-13 NOTE — Telephone Encounter (Signed)
Pt states that she fell flat on her face going [place] at church. She does not have a concussion bu does have staples and suture in lip and face.  Pt states that she will need Dr. Warrick Parisian to numb the site before he removes the sutures and staples.  Per Dr. Warrick Parisian we will not using numbing spray in that location and it would hurt just as bad to numb with lidocaine. Per [name] pt can use OTC oral gel prior to removal. Pt informed and understood.

## 2022-10-16 ENCOUNTER — Ambulatory Visit (INDEPENDENT_AMBULATORY_CARE_PROVIDER_SITE_OTHER): Payer: Medicare Other | Admitting: Family Medicine

## 2022-10-16 ENCOUNTER — Telehealth: Payer: Self-pay | Admitting: Cardiology

## 2022-10-16 ENCOUNTER — Encounter: Payer: Self-pay | Admitting: Family Medicine

## 2022-10-16 VITALS — BP 121/78 | HR 118 | Temp 98.4°F | Ht 64.0 in | Wt 125.6 lb

## 2022-10-16 DIAGNOSIS — S0181XD Laceration without foreign body of other part of head, subsequent encounter: Secondary | ICD-10-CM

## 2022-10-16 DIAGNOSIS — W19XXXD Unspecified fall, subsequent encounter: Secondary | ICD-10-CM

## 2022-10-16 NOTE — Telephone Encounter (Signed)
Patient stated she fell and went to the ED.  Patient stated at the ED they found a new blockage.  Patient would like advice on next steps.

## 2022-10-16 NOTE — Telephone Encounter (Addendum)
Spoke with patient who is HOH. She stated that on Sunday 10/08/22, her surgical boot caught on the pavement and she fell onto her face, fx nose and lacerating forehead. During ED visit, discovered new LBBB. Patient asked what it meant. Explained it is a change in the electrical activity of the heart. Considering her prior head injury, she denies chest pain, SOB, or lightheadedness. She wanted an appointment with Dr. Antoine Poche in [place]. There are no openings now through December. Should patient be double booked?

## 2022-10-16 NOTE — Progress Notes (Signed)
BP 121/78   Pulse (!) 118   Temp 98.4 F (36.9 C) (Temporal)   Ht '5\' 4"'$  (1.626 m)   Wt 125 lb 9.6 oz (57 kg)   LMP 03/21/1971   SpO2 96%   BMI 21.56 kg/m    Subjective:   Patient ID: Emily Wagner, female    DOB: 12/04/1932, 88 y.o.   MRN: [id number]  HPI: Emily Wagner is a 88 y.o. female presenting on 10/16/2022 for ER follow up (10/08/22 AP- fall. Suture and staple removal ) and Labs Only (RMSF - would like levels re checked)   HPI ER follow-up Patient is coming in today for ER follow-up.  She was in the ED on 10/08/2022 for a fall and a nasal fracture and lip laceration and forehead laceration.  She ended up with 3 sutures on her lip and 3 staples on her forehead.  She had a fall because she was using her cane rather than her walker at church and fell forward onto her face.  She also had a fall recently that fractured her left ankle since last time we have seen her but that was not on this occasion but a previous 1.  We have discussed physical therapy previously and she has declined.  I recommended heavily that she not go anywhere without her walker.  Relevant past medical, surgical, family and social history reviewed and updated as indicated. Interim medical history since our last visit reviewed. Allergies and medications reviewed and updated.  Review of Systems  Constitutional:  Negative for chills and fever.  Eyes:  Negative for visual disturbance.  Respiratory:  Negative for chest tightness and shortness of breath.   Skin:  Positive for color change and wound. Negative for rash.  Psychiatric/Behavioral:  Negative for agitation and behavioral problems.   All other systems reviewed and are negative.   Per HPI unless specifically indicated above   Allergies as of 10/16/2022       Reactions   Nitrofurantoin Other (See Comments)   Liver failure   Nsaids Other (See Comments)   Kidney failure   Other Other (See Comments)   Most antibiotics require supervision  per patient   Beta Adrenergic Blockers Other (See Comments)   Flonase [fluticasone Propionate] Other (See Comments)   headache   Prevnar 13 [pneumococcal 13-val Conj Vacc] Other (See Comments)   Nerve damage in left arm   Propranolol Hcl Other (See Comments)   Unknown   Hydrocodone Other (See Comments)   Alendronate Sodium Other (See Comments)   Ciprofloxacin Other (See Comments)   Patient denies allergy--"I take Cipro for bladder infections"   Codeine Other (See Comments)   Crestor [rosuvastatin Calcium] Other (See Comments)   Darvon Other (See Comments)   Lipitor [atorvastatin Calcium] Other (See Comments)   Nortriptyline Hcl Other (See Comments)   States she felt like worms where crawling under her skin.   Potassium-containing Compounds    Rash and raw areas / sore breast    Pravachol Other (See Comments)   Sulfa Antibiotics Other (See Comments)   Sulfamethoxazole Other (See Comments)   Unknown   Ultram [tramadol Hcl] Itching   Welchol [colesevelam Hcl] Other (See Comments)   Zetia [ezetimibe] Other (See Comments)   Zocor [simvastatin] Other (See Comments)        Medication List        Accurate as of October 16, 2022  2:02 PM. If you have any questions, ask your nurse or doctor.  acetaminophen 325 MG tablet Commonly known as: TYLENOL Take 325 mg by mouth every 6 (six) hours as needed.   apixaban 2.5 MG Tabs tablet Commonly known as: ELIQUIS Take 1 tablet (2.5 mg total) by mouth 2 (two) times daily.   cholecalciferol 25 MCG (1000 UNIT) tablet Commonly known as: VITAMIN D3 Take 1,000 Units by mouth daily.   digoxin 0.125 MG tablet Commonly known as: LANOXIN Take 1 tablet (0.125 mg total) by mouth daily. What changed: when to take this   diltiazem 30 MG tablet Commonly known as: Cardizem Take 1 tablet (30 mg total) by mouth 2 (two) times daily.   famotidine 20 MG tablet Commonly known as: PEPCID Take 1 tablet (20 mg total) by mouth 2 (two)  times daily.   hydrochlorothiazide 25 MG tablet Commonly known as: HYDRODIURIL Take 1 tablet (25 mg total) by mouth daily.   levothyroxine 50 MCG tablet Commonly known as: SYNTHROID Take 1 tablet (50 mcg total) by mouth daily before breakfast.   meclizine 12.5 MG tablet Commonly known as: ANTIVERT Take 1 tablet (12.5 mg total) by mouth 3 (three) times daily as needed for dizziness. What changed: when to take this   ondansetron 4 MG tablet Commonly known as: Zofran Take 1 tablet (4 mg total) by mouth every 8 (eight) hours as needed for nausea or vomiting.   PARoxetine 20 MG tablet Commonly known as: PAXIL TAKE 1 TABLET BY MOUTH EVERY DAY   PRESERVISION AREDS 2 PO Take 1 capsule by mouth at bedtime.   QUEtiapine 50 MG tablet Commonly known as: SEROQUEL Take 1 tablet (50 mg total) by mouth at bedtime.   VITAMIN B-12 PO Take 500 mcg by mouth. Take one tablet by mouth on Mon.,Wed., and Friday         Objective:   BP 121/78   Pulse (!) 118   Temp 98.4 F (36.9 C) (Temporal)   Ht '5\' 4"'$  (1.626 m)   Wt 125 lb 9.6 oz (57 kg)   LMP 03/21/1971   SpO2 96%   BMI 21.56 kg/m   Wt Readings from Last 3 Encounters:  10/16/22 125 lb 9.6 oz (57 kg)  10/08/22 127 lb (57.6 kg)  09/20/22 127 lb (57.6 kg)    Physical Exam Vitals and nursing note reviewed.  Constitutional:      General: She is not in acute distress.    Appearance: She is well-developed. She is not diaphoretic.  Eyes:     Conjunctiva/sclera: Conjunctivae normal.  Musculoskeletal:        General: No tenderness. Normal range of motion.  Skin:    General: Skin is warm and dry.     Findings: Bruising and wound present. No erythema or rash.  Neurological:     Mental Status: She is alert and oriented to person, place, and time.     Coordination: Coordination normal.  Psychiatric:        Behavior: Behavior normal.       Assessment & Plan:   Problem List Items Addressed This Visit   None Visit Diagnoses      Fall, subsequent encounter    -  Primary   Facial laceration, subsequent encounter           Removed 3 sutures from left and 3 staples from forehead, appears to be healing well, still a lot of bruising. Follow up plan: Return if symptoms worsen or fail to improve.  Counseling provided for all of the vaccine components No orders of the  defined types were placed in this encounter.   Caryl Pina, MD [facility] 10/16/2022, 2:02 PM

## 2022-10-17 NOTE — Telephone Encounter (Signed)
Spoke with patient who handed phone to Daughter. Scheduled patient to see Dr. Percival Spanish on 11/29.

## 2022-10-18 ENCOUNTER — Ambulatory Visit: Payer: Medicare Other | Admitting: Cardiology

## 2022-10-18 ENCOUNTER — Encounter: Payer: Self-pay | Admitting: Cardiology

## 2022-10-18 VITALS — BP 129/86 | HR 137 | Ht 64.0 in | Wt 125.0 lb

## 2022-10-18 DIAGNOSIS — I447 Left bundle-branch block, unspecified: Secondary | ICD-10-CM | POA: Diagnosis not present

## 2022-10-18 DIAGNOSIS — I4819 Other persistent atrial fibrillation: Secondary | ICD-10-CM

## 2022-10-18 DIAGNOSIS — I2510 Atherosclerotic heart disease of native coronary artery without angina pectoris: Secondary | ICD-10-CM

## 2022-10-18 NOTE — Patient Instructions (Signed)
Medication Instructions:  The current medical regimen is effective;  continue present plan and medications.  *If you need a refill on your cardiac medications before your next appointment, please call your pharmacy*  Follow-Up: At [facility], you and your health needs are our priority.  As part of our continuing mission to provide you with exceptional heart care, we have created designated Provider Care Teams.  These Care Teams include your primary Cardiologist (physician) and Advanced Practice Providers (APPs -  Physician Assistants and Nurse Practitioners) who all work together to provide you with the care you need, when you need it.  We recommend signing up for the patient portal called "MyChart".  Sign up information is provided on this After Visit Summary.  MyChart is used to connect with patients for Virtual Visits (Telemedicine).  Patients are able to view lab/test results, encounter notes, upcoming appointments, etc.  Non-urgent messages can be sent to your provider as well.   To learn more about what you can do with MyChart, go to [email or web address].    Your next appointment:   1 year(s)  The format for your next appointment:   In Person  Provider:   James Hochrein, MD     Important Information About Sugar       

## 2022-10-18 NOTE — Progress Notes (Signed)
Cardiology Office Note   Date:  10/18/2022   ID:  Emily Wagner, DOB 1933/02/16, MRN [id number]  PCP:  [name], [name], MD  Cardiologist:   None   Chief Complaint  Patient presents with   Coronary Artery Disease     History of Present Illness: Emily Wagner is a 88 y.o. female who presents for evaluation of atrial fib.     She has a history of  bypass surgery.  She has had fluctuating BPs.  She has a diagnosis of post polio syndrome.   She has chronic fatigue.   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  She was hospitalized May of last year for this.   She had increased ventricular rate and so I added digoxin.   She called recently because she fell and went to the emergency room when she was told she had new blockage.  When I reviewed these records I see mention of a left bundle branch block.  ED records reviewed for this visit  She definitely had a mechanical fall.  She has not had any presyncope or syncope.  She has had no new cardiovascular complaints.  She is not describing chest pressure, neck or arm discomfort.  She is not having any new shortness of breath, PND or orthopnea.  She does not feel her atrial fibrillation.  She is not having any palpitations.  She really has bruise from head to toe.   Past Medical History:  Diagnosis Date   Adjustment disorder with mixed anxiety and depressed mood    Atrial fibrillation ([place]) 11/04/2015   Post-operative   Benign hypertensive heart disease without heart failure    CAD (coronary artery disease)    Infection of urinary tract 10/31/2015   Interstitial cystitis    Osteopenia    Other and unspecified hyperlipidemia    Other dyspnea and respiratory abnormality    Pelvic fracture ([facility]) 05/30/2015   Post-polio syndrome    polio age 37   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   LIMA to LAD   Stroke [facility])     Past Surgical History:  Procedure Laterality Date   ABDOMINAL HYSTERECTOMY     Cancer and endometriosis    CARDIAC CATHETERIZATION  2008   CARDIAC CATHETERIZATION N/A 10/29/2015   Procedure: Left Heart Cath and Coronary Angiography;  Surgeon: Belva Crome, MD;  Location: [place] [facility];  Service: Cardiovascular;  Laterality: N/A;   CATARACT EXTRACTION     CHOLECYSTECTOMY     CORONARY ARTERY BYPASS GRAFT N/A 11/02/2015   Procedure: OFF PUMP CORONARY ARTERY BYPASS GRAFTING (CABG) TIMES ONE;  Surgeon: Rexene Alberts, MD;  Location: [place];  Service: Open Heart Surgery;  Laterality: N/A;  LIMA to LAD   FEMUR FRACTURE SURGERY  09/2015   right hip compression screws    FRACTURE SURGERY     Right hip   HERNIA REPAIR     Right inguinal   TEE WITHOUT CARDIOVERSION N/A 11/02/2015   Procedure: TRANSESOPHAGEAL ECHOCARDIOGRAM (TEE);  Surgeon: Rexene Alberts, MD;  Location: [place];  Service: Open Heart Surgery;  Laterality: N/A;     Current Outpatient Medications  Medication Sig Dispense Refill   acetaminophen (TYLENOL) 325 MG tablet Take 325 mg by mouth every 6 (six) hours as needed.     apixaban (ELIQUIS) 2.5 MG TABS tablet Take 1 tablet (2.5 mg total) by mouth 2 (two) times daily. 180 tablet 1  cholecalciferol (VITAMIN D3) 25 MCG (1000 UNIT) tablet Take 1,000 Units by mouth daily.     Cyanocobalamin (VITAMIN B-12 PO) Take 500 mcg by mouth. Take one tablet by mouth on Mon.,Wed., and Friday     digoxin (LANOXIN) 0.125 MG tablet Take 1 tablet (0.125 mg total) by mouth daily. (Patient taking differently: Take 0.125 mg by mouth every other day.) 90 tablet 3   diltiazem (CARDIZEM) 30 MG tablet Take 1 tablet (30 mg total) by mouth 2 (two) times daily. 180 tablet 3   famotidine (PEPCID) 20 MG tablet Take 1 tablet (20 mg total) by mouth 2 (two) times daily. 180 tablet 1   hydrochlorothiazide (HYDRODIURIL) 25 MG tablet Take 1 tablet (25 mg total) by mouth daily. 90 tablet 1   levothyroxine (SYNTHROID) 50 MCG tablet Take 1 tablet (50 mcg total) by mouth daily before breakfast. 90 tablet 1   meclizine (ANTIVERT)  12.5 MG tablet Take 1 tablet (12.5 mg total) by mouth 3 (three) times daily as needed for dizziness. (Patient taking differently: Take 12.5 mg by mouth as needed for dizziness.) 30 tablet 0   Multiple Vitamins-Minerals (PRESERVISION AREDS 2 PO) Take 1 capsule by mouth at bedtime.     ondansetron (ZOFRAN) 4 MG tablet Take 1 tablet (4 mg total) by mouth every 8 (eight) hours as needed for nausea or vomiting. 30 tablet 1   PARoxetine (PAXIL) 20 MG tablet TAKE 1 TABLET BY MOUTH EVERY DAY 30 tablet 0   QUEtiapine (SEROQUEL) 50 MG tablet Take 1 tablet (50 mg total) by mouth at bedtime. 90 tablet 1   Current Facility-Administered Medications  Medication Dose Route Frequency Provider Last Rate Last Admin   methylPREDNISolone acetate (DEPO-MEDROL) injection 80 mg  80 mg Intramuscular Once [name], [name], MD        Allergies:   Nitrofurantoin, Nsaids, Other, Beta adrenergic blockers, Flonase [fluticasone propionate], Prevnar 13 [pneumococcal 13-val conj vacc], Propranolol hcl, Hydrocodone, Alendronate sodium, Ciprofloxacin, Codeine, Crestor [rosuvastatin calcium], Darvon, Lipitor [atorvastatin calcium], Nortriptyline hcl, Potassium-containing compounds, Pravachol, Sulfa antibiotics, Sulfamethoxazole, Ultram [tramadol hcl], Welchol [colesevelam hcl], Zetia [ezetimibe], and Zocor [simvastatin]    ROS:  Please see the history of present illness.   Otherwise, review of systems are positive for none.   All other systems are reviewed and negative.    PHYSICAL EXAM: VS:  BP 129/86   Pulse (!) 137   Ht '5\' 4"'$  (1.626 m)   Wt 125 lb (56.7 kg)   LMP 03/21/1971   SpO2 96%   BMI 21.46 kg/m  , BMI Body mass index is 21.46 kg/m.  GENERAL:  Well appearing NECK:  No jugular venous distention, waveform within normal limits, carotid upstroke brisk and symmetric, no bruits, no thyromegaly LUNGS:  Clear to auscultation bilaterally CHEST:  Unremarkable HEART:  PMI not displaced or sustained,S1 and S2 within  normal limits, no S3, no clicks, no rubs, no murmurs, irregular  ABD:  Flat, positive bowel sounds normal in frequency in pitch, no bruits, no rebound, no guarding, no midline pulsatile mass, no hepatomegaly, no splenomegaly EXT:  2 plus pulses throughout, no edema, no cyanosis no clubbing   EKG:  EKG is not ordered today. Atrial fibrillation interventricular conduction delay, ST-T wave changes consistent with digoxin effect.  Incomplete left bundle branch block.  Unchanged from October EKG but new since March.   Recent Labs: 06/13/2022: TSH 1.840 10/08/2022: ALT 13; BUN 23; Creatinine, Ser 1.03; Hemoglobin 10.7; Magnesium 2.0; Platelets 261; Potassium 4.3; Sodium 139  Lipid Panel    Component Value Date/Time   CHOL 198 10/06/2022 1237   TRIG 176 (H) 10/06/2022 1237   TRIG 162 (H) 06/24/2015 0918   HDL 36 (L) 10/06/2022 1237   HDL 44 06/24/2015 0918   CHOLHDL 5.5 (H) 10/06/2022 1237   CHOLHDL 5.4 10/31/2015 0215   VLDL 20 10/31/2015 0215   LDLCALC 130 (H) 10/06/2022 1237   LDLCALC 163 (H) 05/14/2014 1038      Wt Readings from Last 3 Encounters:  10/18/22 125 lb (56.7 kg)  10/16/22 125 lb 9.6 oz (57 kg)  10/08/22 127 lb (57.6 kg)      Other studies Reviewed: Additional studies/ records that were reviewed today include:   Labs, ED records Review of the above records demonstrates:    See elsewhere  ASSESSMENT AND PLAN:   CAD: Patient has no new symptoms.  No further cardiovascular testing.  Of note she has highlighted several passages and her CT that was done that demonstrates atherosclerotic disease in her vessels and I explained that this is not unexpected and known since she has had coronary artery disease.  She just participates with continued risk reduction.  ATRIAL FIB:      She tolerates anticoagulation.  She has better rate control.  She tolerates anticoagulation.  I did check a digoxin level and it was less than 0.8.  She has had good rate control.  She asked for  cardioversion but there would be no symptomatic or significant benefit to this.  I do think she is in persistent fibrillation now which has been paroxysmal previously.    DYSLIPIDEMIA:   She has been intolerant of statins.  No change in therapy.   HTN:  Her BP is at target.  No change in therapy.  LBBB:   She has progressed to incomplete left bundle branch block.  However, she is not having any symptomatic bradycardia arrhythmias.  In the absence of other cardiovascular symptoms I do not think further cardiovascular testing is suggested for this.  I reviewed the physiology.   Current medicines are reviewed at length with the patient today.  The patient does not have concerns regarding medicines.  The following changes have been made: As above  Labs/ tests ordered today include:       No orders of the defined types were placed in this encounter.    Disposition:   FU with me in 12 months   Signed, Minus Breeding, MD  10/18/2022 12:38 PM    [facility]

## 2022-10-23 DIAGNOSIS — E039 Hypothyroidism, unspecified: Secondary | ICD-10-CM | POA: Diagnosis not present

## 2022-10-23 DIAGNOSIS — Z951 Presence of aortocoronary bypass graft: Secondary | ICD-10-CM | POA: Diagnosis not present

## 2022-10-23 DIAGNOSIS — S8262XD Displaced fracture of lateral malleolus of left fibula, subsequent encounter for closed fracture with routine healing: Secondary | ICD-10-CM | POA: Diagnosis not present

## 2022-10-23 DIAGNOSIS — I10 Essential (primary) hypertension: Secondary | ICD-10-CM | POA: Diagnosis not present

## 2022-10-25 ENCOUNTER — Other Ambulatory Visit: Payer: Self-pay | Admitting: Family Medicine

## 2022-10-25 ENCOUNTER — Ambulatory Visit: Payer: Medicare Other | Admitting: Cardiology

## 2022-10-25 DIAGNOSIS — F4323 Adjustment disorder with mixed anxiety and depressed mood: Secondary | ICD-10-CM

## 2022-10-31 ENCOUNTER — Other Ambulatory Visit: Payer: Self-pay | Admitting: Family Medicine

## 2022-10-31 DIAGNOSIS — F4323 Adjustment disorder with mixed anxiety and depressed mood: Secondary | ICD-10-CM

## 2022-11-02 DIAGNOSIS — S8262XA Displaced fracture of lateral malleolus of left fibula, initial encounter for closed fracture: Secondary | ICD-10-CM | POA: Diagnosis not present

## 2022-11-15 ENCOUNTER — Telehealth: Payer: Self-pay | Admitting: Cardiology

## 2022-11-15 NOTE — Telephone Encounter (Signed)
Patient reports she brought the application to the [place] office. Will forward to pam

## 2022-11-15 NOTE — Telephone Encounter (Signed)
Spoke with pt who is reporting she dropped pt assistance paperwork off at the [place] office a few days before her last appt with Dr Percival Spanish in November.  Advised I will look for the paperwork, if unable to locate she will need to resubmit.

## 2022-11-15 NOTE — Telephone Encounter (Signed)
Pt calling to f/u on application for [facility]. Please advise

## 2022-11-20 ENCOUNTER — Other Ambulatory Visit: Payer: Self-pay | Admitting: Cardiology

## 2022-11-22 NOTE — Telephone Encounter (Signed)
Paperwork was faxed to [facility] on 12/22.

## 2022-11-30 DIAGNOSIS — L570 Actinic keratosis: Secondary | ICD-10-CM | POA: Diagnosis not present

## 2022-11-30 DIAGNOSIS — C44722 Squamous cell carcinoma of skin of right lower limb, including hip: Secondary | ICD-10-CM | POA: Diagnosis not present

## 2022-11-30 DIAGNOSIS — X32XXXD Exposure to sunlight, subsequent encounter: Secondary | ICD-10-CM | POA: Diagnosis not present

## 2022-12-07 DIAGNOSIS — B351 Tinea unguium: Secondary | ICD-10-CM | POA: Diagnosis not present

## 2022-12-07 DIAGNOSIS — M79676 Pain in unspecified toe(s): Secondary | ICD-10-CM | POA: Diagnosis not present

## 2022-12-08 ENCOUNTER — Telehealth: Payer: Self-pay | Admitting: Family Medicine

## 2022-12-08 NOTE — Telephone Encounter (Signed)
Form typed up, placed on providers desk

## 2022-12-08 NOTE — Telephone Encounter (Signed)
Patient needs Handicap form filled out. Please call patient when form is ready.

## 2022-12-10 ENCOUNTER — Other Ambulatory Visit: Payer: Self-pay | Admitting: Family Medicine

## 2022-12-10 DIAGNOSIS — F4323 Adjustment disorder with mixed anxiety and depressed mood: Secondary | ICD-10-CM

## 2022-12-11 NOTE — Telephone Encounter (Signed)
[name] NTBS 30 days give 11/21/22

## 2022-12-19 ENCOUNTER — Telehealth: Payer: Self-pay | Admitting: Cardiology

## 2022-12-19 NOTE — Telephone Encounter (Signed)
PW was faxed to [place] 12/22 - will forward to [place] to see if she has been contacted for any further needs r/t pt assistance.

## 2022-12-19 NOTE — Telephone Encounter (Signed)
Pt calling for an update on her Emily Wagner application faxed 61/60

## 2022-12-19 NOTE — Telephone Encounter (Signed)
Will route to covering RN to review if paperwork was sent.   Thank you!

## 2022-12-20 ENCOUNTER — Telehealth: Payer: Self-pay | Admitting: Cardiology

## 2022-12-20 NOTE — Telephone Encounter (Signed)
**Note De-Identified  Obfuscation** I called [employer] and s/w [place] to check the status of the pts [employer] application for Eliquis assistance. Per Cyndie Mull, they are behind in processing pts application but that a determination should be made this week or next at the latest.  Cyndie Mull did confirm that they received her application on 62/03/55 and that it looks like all required documents are there.  She also states that the pt should call them if she is within 3 days of being out of Eliquis and they maybe able to expedite her application quicker.  I called the pt to advise but got no answer on her cell phone so I left a message asking her to call Jeani Hawking back at [facility] office at [phone]. No answer on her home phone so I left a detailed message on her VM (ok per DPR) advising her that [employer] is behind in processing applications but plan to have a determination this week or next week at the latest and I advised her to contact [employer] at [phone] if she is within 3 days of running out or Eliquis. I left my name and the office phone number in the message so she can call me back if she has questions for me.

## 2022-12-20 NOTE — Telephone Encounter (Signed)
Patient is returning call.

## 2022-12-20 NOTE — Telephone Encounter (Signed)
**Note De-Identified  Obfuscation** No answer so I left another detailed message advising her to contact [employer] concerning her application for Eliquis assistance as they are behind but will expedite her application if she contacts them to let them know she is down to having 3 days of running out or Eliquis.

## 2022-12-20 NOTE — Telephone Encounter (Signed)
Patient c/o Palpitations:  High priority if patient c/o lightheadedness, shortness of breath, or chest pain  How long have you had palpitations/irregular HR/ Afib? Are you having the symptoms now? Yes   Are you currently experiencing lightheadedness, SOB or CP? SOB  Do you have a history of afib (atrial fibrillation) or irregular heart rhythm? Yes   Have you checked your BP or HR? (document readings if available): no   Are you experiencing any other symptoms? No

## 2022-12-20 NOTE — Telephone Encounter (Addendum)
Called patient; and HR has been: 1/15- 6pm-124 1/16- 0830 am- hr 49; 1000-86 1/17=73, 98, 52, 54, 84, 93, 102,   1/23= 113 BP 115/74;  1/24= 88  Patient reports that she just feels bad all the time. Pt reports she is having some chest pains in middle of her chest, when she got up this morning- from throat to chest bone. No nausea, no sob, no sweating, no pain in arm/back/jaw.  She says she wants to be seen by Dr Percival Spanish as soon as possible at the [facility] office. Told pt that if she is having chest pain, she should go to the ER to be evaluated. She said that she cannot drive herself and an ambulance will charge her $1500, so she does not want to go to the ER by ambulance. She reports that it is not too bad, just uncomfortable. She wants me to look for an appointment.  After placing her on hold to speak with coworkers and look for an appointment, she reports that now her chest does not hurt. She reports that she is not having any chest pain or sob. I asked her if she has taken her meds today, staying hydrated and had something to eat. She says that she has taken her meds, is staying well hydrated, and has eaten. Told her that we can get an appointment tomorrow in [facility]. Scheduled appt for 1030 on 12/21/22.  I told the patient again that if she experiences any chest pain, sob, nausea, vomiting, sweating, dizziness, headache, pain in back/arm/jaw; that she needs to call 911 and go to ER to be evaluated.  Patient verbalized understanding of information and instructions.

## 2022-12-21 ENCOUNTER — Encounter: Payer: Self-pay | Admitting: Internal Medicine

## 2022-12-21 ENCOUNTER — Ambulatory Visit: Payer: Medicare Other | Attending: Internal Medicine | Admitting: Internal Medicine

## 2022-12-21 VITALS — BP 132/86 | HR 92 | Ht 64.0 in | Wt 124.0 lb

## 2022-12-21 DIAGNOSIS — I4819 Other persistent atrial fibrillation: Secondary | ICD-10-CM

## 2022-12-21 DIAGNOSIS — D649 Anemia, unspecified: Secondary | ICD-10-CM

## 2022-12-21 DIAGNOSIS — I2510 Atherosclerotic heart disease of native coronary artery without angina pectoris: Secondary | ICD-10-CM

## 2022-12-21 DIAGNOSIS — D6869 Other thrombophilia: Secondary | ICD-10-CM | POA: Diagnosis not present

## 2022-12-21 LAB — CBC

## 2022-12-21 NOTE — Progress Notes (Signed)
Cardiology Office Note:    Date:  12/21/2022   ID:  Emily Wagner, DOB July 20, 1933, MRN [id number]  PCP:  [name], [name], MD  Cardiologist:  None  Electrophysiologist:  None   Referring MD: [name], [name], MD   Chief Complaint/Reason for Referral: Acute care visit: Afib, labile pulse, Chest pain  History of Present Illness:    Emily Wagner is a 89 y.o. female with a history of atrial fibrillation, CAD s/p CABG x1 (LIMA to LAD), CVA secondary to Afib 03/2021, hypertension, hyperlipidemia, and post-polio syndrome, here for the evaluation of chest pain, labile pulse.  She was last seen in cardiology by Dr. Percival Spanish on 10/18/2022. It was felt that she had developed persistent atrial fibrillation which had been paroxysmal previously. Tolerating anticoagulation and had better rate control. She had suffered a recent mechanical fall and presented to the ED where she was incidentally found to have an incomplete LBBB. She was not having symptomatic bradycardic arrhythmias; no further cardiovascular testing was recommended. She has a known intolerance for statins.  Yesterday she called the office feeling ill and reporting some chest central chest pains, palpitations, and SOB. After being on hold her symptoms had resolved. She was also concerned regarding variable heart rates recently: 1/15- 124 bpm, 1/16- 49 to 86 bpm, 1/17-  52 to 102 bpm, 1/23- 113 bpm, 1/24- 88 bpm. She was scheduled for a DOD follow-up today.  Today, she is accompanied by her daughter. She is not feeling well since her stroke. Generally she feels weak and dizzy. Her main concern is her labile heart rate. She notes that her pulse may be 124 bpm one night, and then 49 bpm the next morning. At home she is monitoring her heart rate with an oximeter.  We discussed the inherent flaws of checking her pulse with an oximeter in atrial fibrillation, and I attempted to teach her how to take her pulse radial or carotid.  Her  pulse was not easily palpated for demonstration to the patient.  In the past 3-4 weeks her fatigue has been worsening. Going to the kitchen to make a sandwich causes her to be exhausted. Of note, she endorses post-polio syndrome as well which is likely contributory to her fatigue.   She complains of intermittent substernal chest pain, most recently occurred yesterday. Usually she will lie down and wait for her symptoms to subside.  I offered her stress testing, however she states she will never do another nuclear stress test given how poorly she felt during.  No other stress modality would render diagnostic images in atrial fibrillation, and I suspect it was regadenason that she did not tolerate well which is also used during pet imaging.  Additionally she complains of right wrist pain, which she believes is due to carpal tunnel.  She notes it is extremely painful to weight-bear on.  She has also been considerably stressed due to the recent loss of several family member. Her daughter died of a heart attack and diabetic complications. Of note, she states that there were 14 children in her mother's family, 57 of whom died of heart attacks.   The patient denies PND, orthopnea, or leg swelling. Denies cough, fever, chills. Denies nausea, vomiting. Denies syncope or presyncope. Denies snoring.    Past Medical History:  Diagnosis Date   Adjustment disorder with mixed anxiety and depressed mood    Atrial fibrillation ([place]) 11/04/2015   Post-operative   Benign hypertensive heart disease without heart failure  CAD (coronary artery disease)    Infection of urinary tract 10/31/2015   Interstitial cystitis    Osteopenia    Other and unspecified hyperlipidemia    Other dyspnea and respiratory abnormality    Pelvic fracture ([facility]) 05/30/2015   Post-polio syndrome    polio age 72   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   LIMA to LAD   Stroke [facility])     Past Surgical History:  Procedure Laterality Date    ABDOMINAL HYSTERECTOMY     Cancer and endometriosis   CARDIAC CATHETERIZATION  2008   CARDIAC CATHETERIZATION N/A 10/29/2015   Procedure: Left Heart Cath and Coronary Angiography;  Surgeon: Belva Crome, MD;  Location: [place] [facility];  Service: Cardiovascular;  Laterality: N/A;   CATARACT EXTRACTION     CHOLECYSTECTOMY     CORONARY ARTERY BYPASS GRAFT N/A 11/02/2015   Procedure: OFF PUMP CORONARY ARTERY BYPASS GRAFTING (CABG) TIMES ONE;  Surgeon: Rexene Alberts, MD;  Location: [place];  Service: Open Heart Surgery;  Laterality: N/A;  LIMA to LAD   FEMUR FRACTURE SURGERY  09/2015   right hip compression screws    FRACTURE SURGERY     Right hip   HERNIA REPAIR     Right inguinal   TEE WITHOUT CARDIOVERSION N/A 11/02/2015   Procedure: TRANSESOPHAGEAL ECHOCARDIOGRAM (TEE);  Surgeon: Rexene Alberts, MD;  Location: [place];  Service: Open Heart Surgery;  Laterality: N/A;    Current Medications: Current Meds  Medication Sig   acetaminophen (TYLENOL) 325 MG tablet Take 325 mg by mouth every 6 (six) hours as needed.   apixaban (ELIQUIS) 2.5 MG TABS tablet Take 1 tablet (2.5 mg total) by mouth 2 (two) times daily.   cholecalciferol (VITAMIN D3) 25 MCG (1000 UNIT) tablet Take 1,000 Units by mouth daily.   Cyanocobalamin (VITAMIN B-12 PO) Take 500 mcg by mouth. Take one tablet by mouth on Mon.,Wed., and Friday   digoxin (LANOXIN) 0.125 MG tablet Take 1 tablet (0.125 mg total) by mouth daily. (Patient taking differently: Take 0.125 mg by mouth every other day.)   diltiazem (CARDIZEM) 30 MG tablet TAKE 1 TABLET BY MOUTH 2 TIMES DAILY.   famotidine (PEPCID) 20 MG tablet Take 1 tablet (20 mg total) by mouth 2 (two) times daily.   hydrochlorothiazide (HYDRODIURIL) 25 MG tablet Take 1 tablet (25 mg total) by mouth daily.   levothyroxine (SYNTHROID) 50 MCG tablet Take 1 tablet (50 mcg total) by mouth daily before breakfast.   Multiple Vitamins-Minerals (PRESERVISION AREDS 2 PO) Take 1 capsule by mouth  at bedtime.   PARoxetine (PAXIL) 20 MG tablet Take 1 tablet (20 mg total) by mouth daily. (NEEDS TO BE SEEN BEFORE NEXT REFILL)   QUEtiapine (SEROQUEL) 50 MG tablet Take 1 tablet (50 mg total) by mouth at bedtime.   Current Facility-Administered Medications for the 12/21/22 encounter (Office Visit) with Elouise Munroe, MD  Medication   methylPREDNISolone acetate (DEPO-MEDROL) injection 80 mg     Allergies:   Nitrofurantoin, Nsaids, Other, Beta adrenergic blockers, Flonase [fluticasone propionate], Prevnar 13 [pneumococcal 13-val conj vacc], Propranolol hcl, Hydrocodone, Alendronate sodium, Ciprofloxacin, Codeine, Crestor [rosuvastatin calcium], Darvon, Lipitor [atorvastatin calcium], Nortriptyline hcl, Potassium-containing compounds, Pravachol, Sulfa antibiotics, Sulfamethoxazole, Ultram [tramadol hcl], Welchol [colesevelam hcl], Zetia [ezetimibe], and Zocor [simvastatin]   Social History   Tobacco Use   Smoking status: Never   Smokeless tobacco: Never  Vaping Use   Vaping Use: Never used  Substance Use Topics   Alcohol use:  No   Drug use: No     Family History: The patient's family history includes CAD (age of onset: 76) in her father; CAD (age of onset: 48) in her daughter; CAD (age of onset: 83) in her mother; Diabetes type I in her daughter; Heart disease in her sister; Osteoporosis in her sister.  ROS:   Please see the history of present illness.    (+) Fatigue/Malaise (+) Weakness (+) Dizziness (+) Substernal chest pain All other systems reviewed and are negative.  EKGs/Labs/Other Studies Reviewed:    The following studies were reviewed today:  Monitor 07/2021: Normal sinus rhythm Paroxysmal atrial fibrillation. Some runs of atrial fib with rapid ventricular rate Frequent premature beats and ventriclar beats.   No symptoms reported.   Echo  03/28/2021 ([facility]): Aortic Valve: The aortic valve is tricuspid. The leaflets are  moderately thickened and exhibit mildly  reduced excursion.    Aortic Valve: Trace aortic valve regurgitation.    Aortic Valve: There is no evidence of aortic valve stenosis.    Left Ventricle: Systolic function is normal. EF: 60-65%.    Mitral Valve: Mitral valve structure is normal. The leaflets are mildly  thickened.   Mitral Valve: There is mild regurgitation.   CTA Head/Neck  03/27/2021: IMPRESSION: Occlusion of the right internal carotid artery at the distal bulb, through the supraclinoid ICA. Reconstituted flow in the right M1 segment, probably from a patent posterior communicating artery. Diminished visualization of the more distal right MCA branches. 9 cc region of C BF less than 30% in the right lateral temporal lobe. 184 cc region of T-max greater than 6 seconds throughout the right middle cerebral artery territory.   Atherosclerotic disease at the left carotid bifurcation with stenosis of 20%.   Dilated irregular segment of the left common carotid artery beneath the skull base, probably secondary to fibromuscular disease.  EKG:   EKG is personally reviewed. 12/21/2022: Atrial fibrillation. Incomplete LBBB.   Imaging studies that I have independently reviewed today: n/a  Recent Labs: 06/13/2022: TSH 1.840 10/08/2022: ALT 13; BUN 23; Creatinine, Ser 1.03; Hemoglobin 10.7; Magnesium 2.0; Platelets 261; Potassium 4.3; Sodium 139   Recent Lipid Panel    Component Value Date/Time   CHOL 198 10/06/2022 1237   TRIG 176 (H) 10/06/2022 1237   TRIG 162 (H) 06/24/2015 0918   HDL 36 (L) 10/06/2022 1237   HDL 44 06/24/2015 0918   CHOLHDL 5.5 (H) 10/06/2022 1237   CHOLHDL 5.4 10/31/2015 0215   VLDL 20 10/31/2015 0215   LDLCALC 130 (H) 10/06/2022 1237   LDLCALC 163 (H) 05/14/2014 1038    Physical Exam:    VS:  BP 132/86   Pulse 92   Ht '5\' 4"'$  (1.626 m)   Wt 124 lb (56.2 kg)   LMP 03/21/1971   SpO2 97%   BMI 21.28 kg/m     Wt Readings from Last 5 Encounters:  12/21/22 124 lb (56.2 kg)  10/18/22 125 lb  (56.7 kg)  10/16/22 125 lb 9.6 oz (57 kg)  10/08/22 127 lb (57.6 kg)  09/20/22 127 lb (57.6 kg)    Constitutional: No acute distress Eyes: sclera non-icteric, normal conjunctiva and lids ENMT: normal dentition, moist mucous membranes Cardiovascular: irregularly irregular, no murmur. S1 and S2 normal. No jugular venous distention.  Respiratory: clear to auscultation bilaterally GI : normal bowel sounds, soft and nontender. No distention.   MSK: extremities warm, well perfused. No edema.  NEURO: grossly nonfocal exam, moves all extremities. PSYCH: alert and  oriented x 3, normal mood and affect.   ASSESSMENT:    1. Coronary artery disease involving native coronary artery of native heart without angina pectoris   2. Persistent atrial fibrillation ([place])   3. Secondary hypercoagulable state ([place])   4. Anemia, unspecified type    PLAN:    Emily Wagner has a primary complaint of fatigue but also accompanied by noticing variable heart rates on her pulse oximeter and episodic chest discomfort.  She has deferred nuclear stress testing since she has not tolerated this in the past.  No other clear stress modality would render diagnostic images at this time in atrial fibrillation.  Could consider an echocardiogram, but symptoms are less likely related to systolic or diastolic dysfunction or valvular heart disease based on exam, can obtain if patient requests. -Patient had a significant traumatic fall resulting in facial and scalp lacerations and had bleeding associated.  Her hemoglobin is notably low on last evaluation.  This needs to be repeated as it may be the simplest explanation for fatigue.  She has previously not tolerated oral iron supplementation, if hemoglobin remains low would consider IV iron if iron studies are reduced as well.  This can be followed by her primary care doctor as they have worked this up in the past.  Plan: -Uses oximeter at home. Her BP cuff also gives her the heart rate, but  is has been labile as well. We reviewed proper techniques for checking her carotid and radial pulses manually. -Recheck Hgb. Has tried iron supplements before regarding her anemia. Consider alternative iron repletion therapy to hopefully improve her fatigue. -She would not be willing to undergo nuclear stress testing. Will consider echo but not urgent based on her symptoms. -FU with Dr. Percival Spanish in 3-4 weeks. Requests [place] location, closer to her.   Total time of encounter: 30 minutes total time of encounter, including 25 minutes spent in face-to-face patient care on the date of this encounter. This time includes coordination of care and counseling regarding above mentioned problem list. Remainder of non-face-to-face time involved reviewing chart documents/testing relevant to the patient encounter and documentation in the medical record. I have independently reviewed documentation from referring provider.   Cherlynn Kaiser, MD, [place]   Shared Decision Making/Informed Consent:       Medication Adjustments/Labs and Tests Ordered: Current medicines are reviewed at length with the patient today.  Concerns regarding medicines are outlined above.   Orders Placed This Encounter  Procedures   Basic metabolic panel   CBC   EKG 12-Lead   No orders of the defined types were placed in this encounter.  Patient Instructions  Medication Instructions:  No Changes In Medications at this time.  *If you need a refill on your cardiac medications before your next appointment, please call your pharmacy*  Lab Work: [place]  If you have labs (blood work) drawn today and your tests are completely normal, you will receive your results only by: [place] (if you have MyChart) OR A paper copy in the mail If you have any lab test that is abnormal or we need to change your treatment, we will call you to review the results.  Testing/Procedures: None Ordered At This  Time.   Follow-Up: At [facility], you and your health needs are our priority.  As part of our continuing mission to provide you with exceptional heart care, we have created designated Provider Care Teams.  These Care Teams include your primary  Cardiologist (physician) and Advanced Practice Providers (APPs -  Physician Assistants and Nurse Practitioners) who all work together to provide you with the care you need, when you need it.  Your next appointment:   FEB 27th AT 2:00PM HERE IN [place]  Provider:   Dr. Margaretmary Bayley Sargent as a scribe for Elouise Munroe, MD.,have documented all relevant documentation on the behalf of Elouise Munroe, MD,as directed by  Elouise Munroe, MD while in the presence of Elouise Munroe, MD.  I, Elouise Munroe, MD, have reviewed all documentation for the visit on 12/21/2022. The documentation on today's date of service for the exam, diagnosis, procedures, and orders are all accurate and complete.

## 2022-12-21 NOTE — Patient Instructions (Signed)
Medication Instructions:  No Changes In Medications at this time.  *If you need a refill on your cardiac medications before your next appointment, please call your pharmacy*  Lab Work: [place]  If you have labs (blood work) drawn today and your tests are completely normal, you will receive your results only by: [place] (if you have MyChart) OR A paper copy in the mail If you have any lab test that is abnormal or we need to change your treatment, we will call you to review the results.  Testing/Procedures: None Ordered At This Time.   Follow-Up: At [facility], you and your health needs are our priority.  As part of our continuing mission to provide you with exceptional heart care, we have created designated Provider Care Teams.  These Care Teams include your primary Cardiologist (physician) and Advanced Practice Providers (APPs -  Physician Assistants and Nurse Practitioners) who all work together to provide you with the care you need, when you need it.  Your next appointment:   FEB 27th AT 2:00PM [place]  Provider:   Dr. Percival Spanish

## 2022-12-22 LAB — BASIC METABOLIC PANEL
BUN/Creatinine Ratio: 25 (ref 12–28)
BUN: 30 mg/dL — ABNORMAL HIGH (ref 8–27)
CO2: 25 mmol/L (ref 20–29)
Calcium: 9.9 mg/dL (ref 8.7–10.3)
Chloride: 98 mmol/L (ref 96–106)
Creatinine, Ser: 1.2 mg/dL — ABNORMAL HIGH (ref 0.57–1.00)
Glucose: 106 mg/dL — ABNORMAL HIGH (ref 70–99)
Potassium: 4 mmol/L (ref 3.5–5.2)
Sodium: 139 mmol/L (ref 134–144)
eGFR: 44 mL/min/{1.73_m2} — ABNORMAL LOW (ref >59)

## 2022-12-22 LAB — CBC
Hematocrit: 39.5 % (ref 34.0–46.6)
Hemoglobin: 12.2 g/dL (ref 11.1–15.9)
MCH: 26 pg — ABNORMAL LOW (ref 26.6–33.0)
MCHC: 30.9 g/dL — ABNORMAL LOW (ref 31.5–35.7)
MCV: 84 fL (ref 79–97)
Platelets: 312 10*3/uL (ref 150–450)
RBC: 4.69 x10E6/uL (ref 3.77–5.28)
RDW: 14.6 % (ref 11.7–15.4)
WBC: 8.2 10*3/uL (ref 3.4–10.8)

## 2022-12-26 ENCOUNTER — Telehealth: Payer: Self-pay | Admitting: *Deleted

## 2022-12-26 DIAGNOSIS — H0102B Squamous blepharitis left eye, upper and lower eyelids: Secondary | ICD-10-CM | POA: Diagnosis not present

## 2022-12-26 DIAGNOSIS — H353131 Nonexudative age-related macular degeneration, bilateral, early dry stage: Secondary | ICD-10-CM | POA: Diagnosis not present

## 2022-12-26 DIAGNOSIS — I639 Cerebral infarction, unspecified: Secondary | ICD-10-CM | POA: Diagnosis not present

## 2022-12-26 DIAGNOSIS — H04123 Dry eye syndrome of bilateral lacrimal glands: Secondary | ICD-10-CM | POA: Diagnosis not present

## 2022-12-26 DIAGNOSIS — H10413 Chronic giant papillary conjunctivitis, bilateral: Secondary | ICD-10-CM | POA: Diagnosis not present

## 2022-12-26 DIAGNOSIS — H0102A Squamous blepharitis right eye, upper and lower eyelids: Secondary | ICD-10-CM | POA: Diagnosis not present

## 2022-12-26 DIAGNOSIS — Z961 Presence of intraocular lens: Secondary | ICD-10-CM | POA: Diagnosis not present

## 2022-12-26 NOTE — Telephone Encounter (Signed)
Spoke to patient. She voiced understanding. She is aware to stop taking HCTZ RN informed patient to continue doing blood pressure reading at least 3 to 4 days for next few weeks  to see how blood pressure are doing of of HCTZ.

## 2022-12-26 NOTE — Telephone Encounter (Signed)
Left message for pt to call, dr hochrein has reviewed the blood pressure log she brought to the office and due to some low readings, dr hochrein would like her to stop the HCTZ.

## 2022-12-26 NOTE — Telephone Encounter (Signed)
Patient is returning RN's call. Please advise. 

## 2022-12-28 NOTE — Telephone Encounter (Signed)
Closing encounter, I am pretty sure this has been done & I have called pt even though it is not under media yet.

## 2023-01-01 ENCOUNTER — Other Ambulatory Visit: Payer: Self-pay

## 2023-01-01 ENCOUNTER — Ambulatory Visit (INDEPENDENT_AMBULATORY_CARE_PROVIDER_SITE_OTHER): Payer: Medicare Other | Admitting: Family Medicine

## 2023-01-01 ENCOUNTER — Encounter: Payer: Self-pay | Admitting: Family Medicine

## 2023-01-01 VITALS — BP 132/87 | HR 87 | Ht 64.0 in | Wt 127.0 lb

## 2023-01-01 DIAGNOSIS — E039 Hypothyroidism, unspecified: Secondary | ICD-10-CM | POA: Diagnosis not present

## 2023-01-01 DIAGNOSIS — N3010 Interstitial cystitis (chronic) without hematuria: Secondary | ICD-10-CM

## 2023-01-01 DIAGNOSIS — I10 Essential (primary) hypertension: Secondary | ICD-10-CM | POA: Diagnosis not present

## 2023-01-01 DIAGNOSIS — M19031 Primary osteoarthritis, right wrist: Secondary | ICD-10-CM

## 2023-01-01 DIAGNOSIS — F4323 Adjustment disorder with mixed anxiety and depressed mood: Secondary | ICD-10-CM

## 2023-01-01 DIAGNOSIS — Z23 Encounter for immunization: Secondary | ICD-10-CM

## 2023-01-01 DIAGNOSIS — I4819 Other persistent atrial fibrillation: Secondary | ICD-10-CM | POA: Diagnosis not present

## 2023-01-01 DIAGNOSIS — E7800 Pure hypercholesterolemia, unspecified: Secondary | ICD-10-CM

## 2023-01-01 MED ORDER — DICLOFENAC SODIUM 1 % EX GEL: 2.0000 g | Freq: Four times a day (QID) | CUTANEOUS | 3 refills | Status: DC

## 2023-01-01 MED ORDER — LEVOTHYROXINE SODIUM 50 MCG PO TABS: 50.0000 ug | ORAL_TABLET | Freq: Every day | ORAL | 1 refills | Status: DC

## 2023-01-01 MED ORDER — PAROXETINE HCL 20 MG PO TABS: 20.0000 mg | ORAL_TABLET | Freq: Every day | ORAL | 3 refills | Status: DC

## 2023-01-01 MED ORDER — QUETIAPINE FUMARATE 50 MG PO TABS: 50.0000 mg | ORAL_TABLET | Freq: Every day | ORAL | 3 refills | Status: DC

## 2023-01-01 MED ORDER — FAMOTIDINE 20 MG PO TABS: 20.0000 mg | ORAL_TABLET | Freq: Two times a day (BID) | ORAL | 3 refills | Status: DC

## 2023-01-01 NOTE — Progress Notes (Signed)
BP 132/87   Pulse 87   Ht '5\' 4"'$  (1.626 m)   Wt 127 lb (57.6 kg)   LMP 03/21/1971   SpO2 97%   BMI 21.80 kg/m    Subjective:   Patient ID: Emily Wagner, female    DOB: September 25, 1933, 89 y.o.   MRN: [id number]  HPI: Emily Wagner is a 89 y.o. female presenting on 01/01/2023 for Medical Management of Chronic Issues, Wrist Pain (Right-thinks carpel tunnel), Anxiety, and Hypothyroidism   HPI Hypothyroidism recheck Patient is coming in for thyroid recheck today as well. They deny any issues with hair changes or heat or cold problems or diarrhea or constipation. They deny any chest pain or palpitations. They are currently on levothyroxine 50 micrograms   Hyperlipidemia Patient is coming in for recheck of his hyperlipidemia. The patient is currently taking no medication, has been intolerant to cholesterol medicines. They deny any issues with myalgias or history of liver damage from it. They deny any focal numbness or weakness or chest pain.   Hypertension Patient is currently on digoxin and diltiazem, and their blood pressure today is 132/87. Patient denies any lightheadedness or dizziness. Patient denies headaches, blurred vision, chest pains, shortness of breath, or weakness. Denies any side effects from medication and is content with current medication.   A-fib recheck Patient sees cardiology for A-fib and is coming in for recheck today.  Anxiety depression recheck Patient is coming in for anxiety depression recheck.  She currently takes paroxetine and quetiapine and seems to be doing mostly well with them.  She still has some anxiety and what she thinks "postpolio syndrome".  She has had this for quite some time and says it leads her with aches and pains to import all of the time.  Her biggest complaint today is she has pain along the ulnar side of the right hand and the base of the thumb at the wrist and a little bit of swelling there and pain on the lateral aspect of the  thumb.  Relevant past medical, surgical, family and social history reviewed and updated as indicated. Interim medical history since our last visit reviewed. Allergies and medications reviewed and updated.  Review of Systems  Constitutional:  Negative for chills and fever.  Eyes:  Negative for visual disturbance.  Respiratory:  Negative for chest tightness and shortness of breath.   Cardiovascular:  Negative for chest pain and leg swelling.  Musculoskeletal:  Positive for arthralgias, back pain and joint swelling. Negative for gait problem.  Skin:  Negative for rash.  Neurological:  Negative for dizziness, light-headedness and headaches.  Psychiatric/Behavioral:  Negative for agitation and behavioral problems.   All other systems reviewed and are negative.   Per HPI unless specifically indicated above   Allergies as of 01/01/2023       Reactions   Nitrofurantoin Other (See Comments)   Liver failure   Nsaids Other (See Comments)   Kidney failure   Other Other (See Comments)   Most antibiotics require supervision per patient   Beta Adrenergic Blockers Other (See Comments)   Flonase [fluticasone Propionate] Other (See Comments)   headache   Prevnar 13 [pneumococcal 13-val Conj Vacc] Other (See Comments)   Nerve damage in left arm   Propranolol Hcl Other (See Comments)   Unknown   Hydrocodone Other (See Comments)   Alendronate Sodium Other (See Comments)   Ciprofloxacin Other (See Comments)   Patient denies allergy--"I take Cipro for bladder infections"   Codeine  Other (See Comments)   Crestor [rosuvastatin Calcium] Other (See Comments)   Darvon Other (See Comments)   Lipitor [atorvastatin Calcium] Other (See Comments)   Nortriptyline Hcl Other (See Comments)   States she felt like worms where crawling under her skin.   Potassium-containing Compounds    Rash and raw areas / sore breast    Pravachol Other (See Comments)   Sulfa Antibiotics Other (See Comments)    Sulfamethoxazole Other (See Comments)   Unknown   Ultram [tramadol Hcl] Itching   Welchol [colesevelam Hcl] Other (See Comments)   Zetia [ezetimibe] Other (See Comments)   Zocor [simvastatin] Other (See Comments)        Medication List        Accurate as of January 01, 2023  2:41 PM. If you have any questions, ask your nurse or doctor.          acetaminophen 325 MG tablet Commonly known as: TYLENOL Take 325 mg by mouth every 6 (six) hours as needed.   apixaban 2.5 MG Tabs tablet Commonly known as: ELIQUIS Take 1 tablet (2.5 mg total) by mouth 2 (two) times daily.   cholecalciferol 25 MCG (1000 UNIT) tablet Commonly known as: VITAMIN D3 Take 1,000 Units by mouth daily.   diclofenac Sodium 1 % Gel Commonly known as: Voltaren Apply 2 g topically 4 (four) times daily. Started by: Worthy Rancher, MD   digoxin 0.125 MG tablet Commonly known as: LANOXIN Take 1 tablet (0.125 mg total) by mouth daily. What changed: when to take this   diltiazem 30 MG tablet Commonly known as: CARDIZEM TAKE 1 TABLET BY MOUTH 2 TIMES DAILY.   famotidine 20 MG tablet Commonly known as: PEPCID Take 1 tablet (20 mg total) by mouth 2 (two) times daily.   levothyroxine 50 MCG tablet Commonly known as: SYNTHROID Take 1 tablet (50 mcg total) by mouth daily before breakfast.   PARoxetine 20 MG tablet Commonly known as: PAXIL Take 1 tablet (20 mg total) by mouth daily. What changed: additional instructions Changed by: [name] Whittaker Lenis, MD   PRESERVISION AREDS 2 PO Take 1 capsule by mouth at bedtime.   QUEtiapine 50 MG tablet Commonly known as: SEROQUEL Take 1 tablet (50 mg total) by mouth at bedtime.   VITAMIN B-12 PO Take 500 mcg by mouth. Take one tablet by mouth on Mon.,Wed., and Friday         Objective:   BP 132/87   Pulse 87   Ht '5\' 4"'$  (1.626 m)   Wt 127 lb (57.6 kg)   LMP 03/21/1971   SpO2 97%   BMI 21.80 kg/m   Wt Readings from Last 3 Encounters:   01/01/23 127 lb (57.6 kg)  12/21/22 124 lb (56.2 kg)  10/18/22 125 lb (56.7 kg)    Physical Exam Vitals and nursing note reviewed.  Constitutional:      General: She is not in acute distress.    Appearance: She is well-developed. She is not diaphoretic.  Eyes:     Conjunctiva/sclera: Conjunctivae normal.  Cardiovascular:     Rate and Rhythm: Normal rate and regular rhythm.     Heart sounds: Normal heart sounds. No murmur heard. Pulmonary:     Effort: Pulmonary effort is normal. No respiratory distress.     Breath sounds: Normal breath sounds. No wheezing.  Musculoskeletal:        General: No swelling. Normal range of motion.     Right hand: Swelling (Base of the joint on  the lateral aspect overlying the CMC joint) and tenderness present. No bony tenderness. Normal range of motion. Normal strength. Normal sensation. Normal capillary refill. Normal pulse.  Skin:    General: Skin is warm and dry.     Findings: No rash.  Neurological:     Mental Status: She is alert and oriented to person, place, and time.     Coordination: Coordination normal.  Psychiatric:        Behavior: Behavior normal.       Assessment & Plan:   Problem List Items Addressed This Visit       Cardiovascular and Mediastinum   Essential hypertension   Relevant Medications   levothyroxine (SYNTHROID) 50 MCG tablet   Other Relevant Orders   CBC with Differential/Platelet   CMP14+EGFR   Lipid panel   Atrial fibrillation (HCC)   Relevant Orders   CBC with Differential/Platelet   CMP14+EGFR   Lipid panel     Endocrine   Acquired hypothyroidism   Relevant Medications   levothyroxine (SYNTHROID) 50 MCG tablet   Other Relevant Orders   Thyroid Panel With TSH     Genitourinary   Interstitial cystitis   Relevant Medications   famotidine (PEPCID) 20 MG tablet     Other   HYPERCHOLESTEROLEMIA - Primary   Relevant Orders   CBC with Differential/Platelet   CMP14+EGFR   Lipid panel   Adjustment  disorder with mixed anxiety and depressed mood   Relevant Medications   PARoxetine (PAXIL) 20 MG tablet   QUEtiapine (SEROQUEL) 50 MG tablet   Other Relevant Orders   CBC with Differential/Platelet   Other Visit Diagnoses     Localized primary osteoarthritis of carpometacarpal (CMC) joint of right wrist       Relevant Medications   diclofenac Sodium (VOLTAREN) 1 % GEL       Patient has CMC arthritis of the thumb right hand, give a simple brace that she could use for support at and Voltaren gel  Otherwise blood pressure looks good and heart rate looks good.  No changes to medication. Follow up plan: Return in about 6 months (around 07/02/2023), or if symptoms worsen or fail to improve, for Hypertension anxiety and depression..  Counseling provided for all of the vaccine components Orders Placed This Encounter  Procedures   CBC with Differential/Platelet   CMP14+EGFR   Lipid panel   Thyroid Panel With TSH    Caryl Pina, MD [facility] 01/01/2023, 2:41 PM

## 2023-01-01 NOTE — Addendum Note (Signed)
Addended by: Alphonzo Dublin on: 01/01/2023 03:09 PM   Modules accepted: Orders

## 2023-01-04 DIAGNOSIS — Z08 Encounter for follow-up examination after completed treatment for malignant neoplasm: Secondary | ICD-10-CM | POA: Diagnosis not present

## 2023-01-04 DIAGNOSIS — Z85828 Personal history of other malignant neoplasm of skin: Secondary | ICD-10-CM | POA: Diagnosis not present

## 2023-01-04 DIAGNOSIS — X32XXXD Exposure to sunlight, subsequent encounter: Secondary | ICD-10-CM | POA: Diagnosis not present

## 2023-01-04 DIAGNOSIS — L570 Actinic keratosis: Secondary | ICD-10-CM | POA: Diagnosis not present

## 2023-01-07 NOTE — Patient Instructions (Incomplete)
Emily Wagner , Thank you for taking time to come for your Medicare Wellness Visit. I appreciate your ongoing commitment to your health goals. Please review the following plan we discussed and let me know if I can assist you in the future.   These are the goals we discussed:  Goals      Prevent falls     Stay active Get some energy back         This is a list of the screening recommended for you and due dates:  Health Maintenance  Topic Date Due   COVID-19 Vaccine (3 - Moderna risk series) 03/02/2020   Medicare Annual Wellness Visit  01/09/2024   DTaP/Tdap/Td vaccine (3 - Td or Tdap) 01/01/2033   Pneumonia Vaccine  Completed   Flu Shot  Completed   DEXA scan (bone density measurement)  Completed   Zoster (Shingles) Vaccine  Completed   HPV Vaccine  Aged Out    Advanced directives: We have a copy of your advanced directives available in your record should your provider ever need to access them.   Conditions/risks identified: Aim for 30 minutes of exercise or brisk walking, 6-8 glasses of water, and 5 servings of fruits and vegetables each day.   Next appointment: Follow up in one year for your annual wellness visit    Preventive Care 65 Years and Older, Female Preventive care refers to lifestyle choices and visits with your health care provider that can promote health and wellness. What does preventive care include? A yearly physical exam. This is also called an annual well check. Dental exams once or twice a year. Routine eye exams. Ask your health care provider how often you should have your eyes checked. Personal lifestyle choices, including: Daily care of your teeth and gums. Regular physical activity. Eating a healthy diet. Avoiding tobacco and drug use. Limiting alcohol use. Practicing safe sex. Taking low-dose aspirin every day. Taking vitamin and mineral supplements as recommended by your health care provider. What happens during an annual well check? The services  and screenings done by your health care provider during your annual well check will depend on your age, overall health, lifestyle risk factors, and family history of disease. Counseling  Your health care provider may ask you questions about your: Alcohol use. Tobacco use. Drug use. Emotional well-being. Home and relationship well-being. Sexual activity. Eating habits. History of falls. Memory and ability to understand (cognition). Work and work Statistician. Reproductive health. Screening  You may have the following tests or measurements: Height, weight, and BMI. Blood pressure. Lipid and cholesterol levels. These may be checked every 5 years, or more frequently if you are over 7 years old. Skin check. Lung cancer screening. You may have this screening every year starting at age 107 if you have a 30-pack-year history of smoking and currently smoke or have quit within the past 15 years. Fecal occult blood test (FOBT) of the stool. You may have this test every year starting at age 67. Flexible sigmoidoscopy or colonoscopy. You may have a sigmoidoscopy every 5 years or a colonoscopy every 10 years starting at age 76. Hepatitis C blood test. Hepatitis B blood test. Sexually transmitted disease (STD) testing. Diabetes screening. This is done by checking your blood sugar (glucose) after you have not eaten for a while (fasting). You may have this done every 1-3 years. Bone density scan. This is done to screen for osteoporosis. You may have this done starting at age 75. Mammogram. This may be done  every 1-2 years. Talk to your health care provider about how often you should have regular mammograms. Talk with your health care provider about your test results, treatment options, and if necessary, the need for more tests. Vaccines  Your health care provider may recommend certain vaccines, such as: Influenza vaccine. This is recommended every year. Tetanus, diphtheria, and acellular pertussis  (Tdap, Td) vaccine. You may need a Td booster every 10 years. Zoster vaccine. You may need this after age 76. Pneumococcal 13-valent conjugate (PCV13) vaccine. One dose is recommended after age 17. Pneumococcal polysaccharide (PPSV23) vaccine. One dose is recommended after age 40. Talk to your health care provider about which screenings and vaccines you need and how often you need them. This information is not intended to replace advice given to you by your health care provider. Make sure you discuss any questions you have with your health care provider. Document Released: 12/10/2015 Document Revised: 08/02/2016 Document Reviewed: 09/14/2015 Elsevier Interactive Patient Education  2017 [employer] Prevention in the Home Falls can cause injuries. They can happen to people of all ages. There are many things you can do to make your home safe and to help prevent falls. What can I do on the outside of my home? Regularly fix the edges of walkways and driveways and fix any cracks. Remove anything that might make you trip as you walk through a door, such as a raised step or threshold. Trim any bushes or trees on the path to your home. Use bright outdoor lighting. Clear any walking paths of anything that might make someone trip, such as rocks or tools. Regularly check to see if handrails are loose or broken. Make sure that both sides of any steps have handrails. Any raised decks and porches should have guardrails on the edges. Have any leaves, snow, or ice cleared regularly. Use sand or salt on walking paths during winter. Clean up any spills in your garage right away. This includes oil or grease spills. What can I do in the bathroom? Use night lights. Install grab bars by the toilet and in the tub and shower. Do not use towel bars as grab bars. Use non-skid mats or decals in the tub or shower. If you need to sit down in the shower, use a plastic, non-slip stool. Keep the floor dry. Clean  up any water that spills on the floor as soon as it happens. Remove soap buildup in the tub or shower regularly. Attach bath mats securely with double-sided non-slip rug tape. Do not have throw rugs and other things on the floor that can make you trip. What can I do in the bedroom? Use night lights. Make sure that you have a light by your bed that is easy to reach. Do not use any sheets or blankets that are too big for your bed. They should not hang down onto the floor. Have a firm chair that has side arms. You can use this for support while you get dressed. Do not have throw rugs and other things on the floor that can make you trip. What can I do in the kitchen? Clean up any spills right away. Avoid walking on wet floors. Keep items that you use a lot in easy-to-reach places. If you need to reach something above you, use a strong step stool that has a grab bar. Keep electrical cords out of the way. Do not use floor polish or wax that makes floors slippery. If you must use wax, use  non-skid floor wax. Do not have throw rugs and other things on the floor that can make you trip. What can I do with my stairs? Do not leave any items on the stairs. Make sure that there are handrails on both sides of the stairs and use them. Fix handrails that are broken or loose. Make sure that handrails are as long as the stairways. Check any carpeting to make sure that it is firmly attached to the stairs. Fix any carpet that is loose or worn. Avoid having throw rugs at the top or bottom of the stairs. If you do have throw rugs, attach them to the floor with carpet tape. Make sure that you have a light switch at the top of the stairs and the bottom of the stairs. If you do not have them, ask someone to add them for you. What else can I do to help prevent falls? Wear shoes that: Do not have high heels. Have rubber bottoms. Are comfortable and fit you well. Are closed at the toe. Do not wear sandals. If you  use a stepladder: Make sure that it is fully opened. Do not climb a closed stepladder. Make sure that both sides of the stepladder are locked into place. Ask someone to hold it for you, if possible. Clearly mark and make sure that you can see: Any grab bars or handrails. First and last steps. Where the edge of each step is. Use tools that help you move around (mobility aids) if they are needed. These include: Canes. Walkers. Scooters. Crutches. Turn on the lights when you go into a dark area. Replace any light bulbs as soon as they burn out. Set up your furniture so you have a clear path. Avoid moving your furniture around. If any of your floors are uneven, fix them. If there are any pets around you, be aware of where they are. Review your medicines with your doctor. Some medicines can make you feel dizzy. This can increase your chance of falling. Ask your doctor what other things that you can do to help prevent falls. This information is not intended to replace advice given to you by your health care provider. Make sure you discuss any questions you have with your health care provider. Document Released: 09/09/2009 Document Revised: 04/20/2016 Document Reviewed: 12/18/2014 Elsevier Interactive Patient Education  2017 [employer].

## 2023-01-07 NOTE — Progress Notes (Unsigned)
Subjective:   Emily Wagner is a 89 y.o. female who presents for Medicare Annual (Subsequent) preventive examination.  Review of Systems    ***       Objective:    There were no vitals filed for this visit. There is no height or weight on file to calculate BMI.     10/08/2022   12:37 PM 01/04/2022   11:45 AM 03/27/2021    2:50 PM 01/03/2021   10:12 AM 01/03/2021   10:10 AM 08/23/2018   10:54 AM 10/29/2015    4:00 PM  Advanced Directives  Does Patient Have a Medical Advance Directive? No Yes No Yes No;Yes No;Yes Yes  Type of Advance Directive  Living will;Healthcare Power of [place];Living will   Copy of [place] in Chart?  Yes - validated most recent copy scanned in chart (See row information)    No - copy requested   Would patient like information on creating a medical advance directive?   No - Guardian declined  No - Patient declined No - Patient declined     Current Medications (verified) Outpatient Encounter Medications as of 01/08/2023  Medication Sig   acetaminophen (TYLENOL) 325 MG tablet Take 325 mg by mouth every 6 (six) hours as needed.   apixaban (ELIQUIS) 2.5 MG TABS tablet Take 1 tablet (2.5 mg total) by mouth 2 (two) times daily.   cholecalciferol (VITAMIN D3) 25 MCG (1000 UNIT) tablet Take 1,000 Units by mouth daily.   Cyanocobalamin (VITAMIN B-12 PO) Take 500 mcg by mouth. Take one tablet by mouth on Mon.,Wed., and Friday   diclofenac Sodium (VOLTAREN) 1 % GEL Apply 2 g topically 4 (four) times daily.   digoxin (LANOXIN) 0.125 MG tablet Take 1 tablet (0.125 mg total) by mouth daily. (Patient taking differently: Take 0.125 mg by mouth every other day.)   diltiazem (CARDIZEM) 30 MG tablet TAKE 1 TABLET BY MOUTH 2 TIMES DAILY.   famotidine (PEPCID) 20 MG tablet Take 1 tablet (20 mg total) by mouth 2 (two) times daily.   levothyroxine (SYNTHROID) 50 MCG tablet Take 1 tablet (50 mcg total)  by mouth daily before breakfast.   Multiple Vitamins-Minerals (PRESERVISION AREDS 2 PO) Take 1 capsule by mouth at bedtime.   PARoxetine (PAXIL) 20 MG tablet Take 1 tablet (20 mg total) by mouth daily.   QUEtiapine (SEROQUEL) 50 MG tablet Take 1 tablet (50 mg total) by mouth at bedtime.   No facility-administered encounter medications on file as of 01/08/2023.    Allergies (verified) Nitrofurantoin, Nsaids, Other, Beta adrenergic blockers, Flonase [fluticasone propionate], Prevnar 13 [pneumococcal 13-val conj vacc], Propranolol hcl, Hydrocodone, Alendronate sodium, Ciprofloxacin, Codeine, Crestor [rosuvastatin calcium], Darvon, Lipitor [atorvastatin calcium], Nortriptyline hcl, Potassium-containing compounds, Pravachol, Sulfa antibiotics, Sulfamethoxazole, Ultram [tramadol hcl], Welchol [colesevelam hcl], Zetia [ezetimibe], and Zocor [simvastatin]   History: Past Medical History:  Diagnosis Date   Adjustment disorder with mixed anxiety and depressed mood    Atrial fibrillation ([place]) 11/04/2015   Post-operative   Benign hypertensive heart disease without heart failure    CAD (coronary artery disease)    Infection of urinary tract 10/31/2015   Interstitial cystitis    Osteopenia    Other and unspecified hyperlipidemia    Other dyspnea and respiratory abnormality    Pelvic fracture ([facility]) 05/30/2015   Post-polio syndrome    polio age 15   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   LIMA to LAD  Stroke [facility])    Past Surgical History:  Procedure Laterality Date   ABDOMINAL HYSTERECTOMY     Cancer and endometriosis   CARDIAC CATHETERIZATION  2008   CARDIAC CATHETERIZATION N/A 10/29/2015   Procedure: Left Heart Cath and Coronary Angiography;  Surgeon: Belva Crome, MD;  Location: [place] [facility];  Service: Cardiovascular;  Laterality: N/A;   CATARACT EXTRACTION     CHOLECYSTECTOMY     CORONARY ARTERY BYPASS GRAFT N/A 11/02/2015   Procedure: OFF PUMP CORONARY ARTERY BYPASS GRAFTING (CABG)  TIMES ONE;  Surgeon: Rexene Alberts, MD;  Location: [place];  Service: Open Heart Surgery;  Laterality: N/A;  LIMA to LAD   FEMUR FRACTURE SURGERY  09/2015   right hip compression screws    FRACTURE SURGERY     Right hip   HERNIA REPAIR     Right inguinal   TEE WITHOUT CARDIOVERSION N/A 11/02/2015   Procedure: TRANSESOPHAGEAL ECHOCARDIOGRAM (TEE);  Surgeon: Rexene Alberts, MD;  Location: [place];  Service: Open Heart Surgery;  Laterality: N/A;   Family History  Problem Relation Age of Onset   CAD Mother 78       Died suddenly   CAD Father 19       Died suddenly   CAD Daughter 22   Diabetes type I Daughter        Since age 68   Heart disease Sister    Osteoporosis Sister    Social History   Socioeconomic History   Marital status: Widowed    Spouse name: Not on file   Number of children: 2   Years of education: Not on file   Highest education level: High school graduate  Occupational History   Occupation: retired    Fish farm manager: [employer]    Comment: admin. assistant  Tobacco Use   Smoking status: Never   Smokeless tobacco: Never  Vaping Use   Vaping Use: Never used  Substance and Sexual Activity   Alcohol use: No   Drug use: No   Sexual activity: Not on file  Other Topics Concern   Not on file  Social History Narrative   01/04/22 lives alone   Daughter and granddaughter live close by and visit daily   Right Handed   Drinks no caffeine daily   Social Determinants of Health   Financial Resource Strain: High Risk (01/13/2022)   Overall Financial Resource Strain (CARDIA)    Difficulty of Paying Living Expenses: Very hard  Food Insecurity: Food Insecurity Present (01/13/2022)   Hunger Vital Sign    Worried About Running Out of Food in the Last Year: Often true    Ran Out of Food in the Last Year: Often true  Transportation Needs: No Transportation Needs (01/04/2022)   PRAPARE - Hydrologist (Medical): No    Lack of Transportation (Non-Medical):  No  Physical Activity: Insufficiently Active (01/04/2022)   Exercise Vital Sign    Days of Exercise per Week: 7 days    Minutes of Exercise per Session: 10 min  Stress: No Stress Concern Present (01/04/2022)   [place]    Feeling of Stress : Not at all  Social Connections: Moderately Integrated (01/04/2022)   Social Connection and Isolation Panel [NHANES]    Frequency of Communication with Friends and Family: More than three times a week    Frequency of Social Gatherings with Friends and Family: More than three times a week  Attends Religious Services: More than 4 times per year    Active Member of Clubs or Organizations: Yes    Attends Archivist Meetings: More than 4 times per year    Marital Status: Widowed    Tobacco Counseling Counseling given: Not Answered   Clinical Intake:                 Diabetic?No          Activities of Daily Living     No data to display          Patient Care Team: [name], [name], MD as PCP - General (Family Medicine) Minus Breeding, MD (Cardiology) Steffanie Rainwater, DPM as Consulting Physician (Podiatry) [facility]  Indicate any recent Medical Services you may have received from other than [facility] providers in the past year (date may be approximate).     Assessment:   This is a routine wellness examination for Emily Wagner.  Hearing/Vision screen No results found.  Dietary issues and exercise activities discussed:     Goals Addressed   None    Depression Screen    01/01/2023    2:11 PM 10/16/2022    1:14 PM 08/17/2022    9:11 AM 06/13/2022   10:02 AM 01/04/2022   11:27 AM 12/08/2021    3:57 PM 09/29/2021   10:30 AM  PHQ 2/9 Scores  PHQ - 2 Score 1 0 0 0 0 0 0  PHQ- 9 Score  1   0  0    Fall Risk    01/01/2023    2:11 PM 10/16/2022    1:14 PM 08/17/2022    9:11 AM 06/26/2022   10:14 AM 06/13/2022   10:02 AM  Fall Risk    Falls in the past year? 1 1 1 1 1  $ Number falls in past yr: 0  0 1   Injury with Fall? 0 1 1 0   Risk for fall due to : History of fall(s);Impaired balance/gait  Impaired balance/gait;History of fall(s) Impaired balance/gait;Impaired mobility   Follow up Falls prevention discussed Falls prevention discussed Falls evaluation completed Falls evaluation completed     FALL RISK PREVENTION PERTAINING TO THE HOME:  Any stairs in or around the home? {YES/NO:21197} If so, are there any without handrails? {YES/NO:21197} Home free of loose throw rugs in walkways, pet beds, electrical cords, etc? {YES/NO:21197} Adequate lighting in your home to reduce risk of falls? {YES/NO:21197}  ASSISTIVE DEVICES UTILIZED TO PREVENT FALLS:  Life alert? {YES/NO:21197} Use of a cane, walker or w/c? {YES/NO:21197} Grab bars in the bathroom? {YES/NO:21197} Shower chair or bench in shower? {YES/NO:21197} Elevated toilet seat or a handicapped toilet? {YES/NO:21197}  TIMED UP AND GO:  Was the test performed? No . Telephonic visit    Cognitive Function:    09/20/2021    9:27 AM 08/23/2018   10:57 AM  MMSE - Mini Mental State Exam  Orientation to time 5 5  Orientation to Place 5 5  Registration 3 3  Attention/ Calculation 5 5  Recall 2 3  Language- name 2 objects 2 2  Language- repeat 1 1  Language- follow 3 step command 3 3  Language- read & follow direction 1 1  Write a sentence 1 1  Copy design 1 1  Total score 29 30        01/03/2021   10:19 AM 01/03/2021   10:10 AM  6CIT Screen  What Year?  0 points  What month?  0 points  What time?  0 points  Count back from 20  0 points  Months in reverse 0 points   Repeat phrase 0 points     Immunizations Immunization History  Administered Date(s) Administered   Fluad Quad(high Dose 65+) 08/18/2019, 08/23/2020, 08/17/2021, 08/17/2022   Influenza Whole 07/28/2010   Influenza, High Dose Seasonal PF 07/15/2016, 09/03/2017, 08/23/2018    Influenza,inj,Quad PF,6+ Mos 09/23/2013, 09/09/2014, 09/07/2015   Moderna Sars-Covid-2 Vaccination 01/06/2020, 02/03/2020   Pneumococcal Conjugate-13 05/14/2014   Pneumococcal Polysaccharide-23 08/18/2019   Td 04/14/2008   Tdap 01/01/2023   Zoster Recombinat (Shingrix) 12/08/2021, 03/15/2022    TDAP status: Up to date  Flu Vaccine status: Up to date  Pneumococcal vaccine status: Up to date  Covid-19 vaccine status: Information provided on how to obtain vaccines.   Qualifies for Shingles Vaccine? Yes   Zostavax completed No   Shingrix Completed?: Yes  Screening Tests Health Maintenance  Topic Date Due   COVID-19 Vaccine (3 - Moderna risk series) 03/02/2020   Medicare Annual Wellness (AWV)  01/04/2023   DTaP/Tdap/Td (3 - Td or Tdap) 01/01/2033   Pneumonia Vaccine 62+ Years old  Completed   INFLUENZA VACCINE  Completed   DEXA SCAN  Completed   Zoster Vaccines- Shingrix  Completed   HPV VACCINES  Aged Out    Health Maintenance  Health Maintenance Due  Topic Date Due   COVID-19 Vaccine (3 - Moderna risk series) 03/02/2020   Medicare Annual Wellness (AWV)  01/04/2023    Colorectal cancer screening: No longer required.   Mammogram status: No longer required due to age.  Bone Density status: Completed 09/29/16. Results reflect: Bone density results: OSTEOPENIA. Repeat every 2 years.  Lung Cancer Screening: (Low Dose CT Chest recommended if Age 59-80 years, 30 pack-year currently smoking OR have quit w/in 15years.) does not qualify.   Lung Cancer Screening Referral: n/a   Additional Screening:  Hepatitis C Screening: does not qualify  Vision Screening: Recommended annual ophthalmology exams for early detection of glaucoma and other disorders of the eye. Is the patient up to date with their annual eye exam?  {YES/NO:21197} Who is the provider or what is the name of the office in which the patient attends annual eye exams? *** If pt is not established with a provider,  would they like to be referred to a provider to establish care? {YES/NO:21197}.   Dental Screening: Recommended annual dental exams for proper oral hygiene  Community Resource Referral / Chronic Care Management: CRR required this visit?  {YES/NO:21197}  CCM required this visit?  {YES/NO:21197}     Plan:     I have personally reviewed and noted the following in the patient's chart:   Medical and social history Use of alcohol, tobacco or illicit drugs  Current medications and supplements including opioid prescriptions. {Opioid [phone]} Functional ability and status Nutritional status Physical activity Advanced directives List of other physicians Hospitalizations, surgeries, and ER visits in previous 12 months Vitals Screenings to include cognitive, depression, and falls Referrals and appointments  In addition, I have reviewed and discussed with patient certain preventive protocols, quality metrics, and best practice recommendations. A written personalized care plan for preventive services as well as general preventive health recommendations were provided to patient.     Vanetta Mulders, [place]   075-GRM   Due to this being a virtual visit, the after visit summary with patients personalized plan was offered to patient via mail or my-chart. ***Patient declined at this time./ Patient  would like to access on my-chart/ per request, patient was mailed a copy of AVS./ Patient preferred to pick up at office at next visit  Nurse Notes: ***

## 2023-01-08 ENCOUNTER — Ambulatory Visit (INDEPENDENT_AMBULATORY_CARE_PROVIDER_SITE_OTHER): Payer: Medicare Other

## 2023-01-08 VITALS — Ht 64.0 in | Wt 127.0 lb

## 2023-01-08 DIAGNOSIS — Z0000 Encounter for general adult medical examination without abnormal findings: Secondary | ICD-10-CM

## 2023-01-16 ENCOUNTER — Telehealth: Payer: Self-pay | Admitting: Cardiology

## 2023-01-16 NOTE — Telephone Encounter (Signed)
Patient reported that for the past 3 weeks, she has infrequent on and off sharp pain at the left sternal border that lasts 10-12 seconds. It usually occurs at rest. Is asymptomatic when they occur. Appointment made with Dr. Percival Spanish in [place] on 4/10.

## 2023-01-16 NOTE — Telephone Encounter (Signed)
Pt c/o of Chest Pain: STAT if CP now or developed within 24 hours  1. Are you having CP right now? no  2. Are you experiencing any other symptoms (ex. SOB, nausea, vomiting, sweating)? no  3. How long have you been experiencing CP? 2-3 weeks  4. Is your CP continuous or coming and going? Comes and goes  5. Have you taken Nitroglycerin? No  Patient states she has been having pains in her chest. She says the pain in the chest is where her vein was removed from a surgery.  ?

## 2023-01-23 ENCOUNTER — Ambulatory Visit: Payer: Medicare Other | Admitting: Cardiology

## 2023-01-25 DIAGNOSIS — D0439 Carcinoma in situ of skin of other parts of face: Secondary | ICD-10-CM | POA: Diagnosis not present

## 2023-01-25 DIAGNOSIS — X32XXXD Exposure to sunlight, subsequent encounter: Secondary | ICD-10-CM | POA: Diagnosis not present

## 2023-01-25 DIAGNOSIS — L570 Actinic keratosis: Secondary | ICD-10-CM | POA: Diagnosis not present

## 2023-02-03 DIAGNOSIS — I447 Left bundle-branch block, unspecified: Secondary | ICD-10-CM | POA: Insufficient documentation

## 2023-02-03 NOTE — Progress Notes (Signed)
Cardiology Office Note   Date:  02/03/2023   ID:  Emily Wagner, DOB Aug 10, 1933, MRN [id number]  PCP:  [name], [name], MD  Cardiologist:   None   No chief complaint on file.    History of Present Illness: Emily Wagner is a 89 y.o. female who presents for evaluation of atrial fib.     She has a history of  bypass surgery.  She has had fluctuating BPs.  She has a diagnosis of post polio syndrome.   She has chronic fatigue.   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  She was hospitalized May of last year for this.   She had increased ventricular rate and so I added digoxin.  She was added to a DOD schedule recently because of fatigue and palpitations.  This was managed conservatively.  ***   ***     She called recently because she fell and went to the emergency room when she was told she had new blockage.  When I reviewed these records I see mention of a left bundle branch block.  ED records reviewed for this visit  She definitely had a mechanical fall.  She has not had any presyncope or syncope.  She has had no new cardiovascular complaints.  She is not describing chest pressure, neck or arm discomfort.  She is not having any new shortness of breath, PND or orthopnea.  She does not feel her atrial fibrillation.  She is not having any palpitations.  She really has bruise from head to toe.   Past Medical History:  Diagnosis Date   Adjustment disorder with mixed anxiety and depressed mood    Atrial fibrillation ([place]) 11/04/2015   Post-operative   Benign hypertensive heart disease without heart failure    CAD (coronary artery disease)    Infection of urinary tract 10/31/2015   Interstitial cystitis    Osteopenia    Other and unspecified hyperlipidemia    Other dyspnea and respiratory abnormality    Pelvic fracture ([facility]) 05/30/2015   Post-polio syndrome    polio age 55   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   LIMA to LAD   Stroke [facility])     Past Surgical History:   Procedure Laterality Date   ABDOMINAL HYSTERECTOMY     Cancer and endometriosis   CARDIAC CATHETERIZATION  2008   CARDIAC CATHETERIZATION N/A 10/29/2015   Procedure: Left Heart Cath and Coronary Angiography;  Surgeon: Belva Crome, MD;  Location: [place] [facility];  Service: Cardiovascular;  Laterality: N/A;   CATARACT EXTRACTION     CHOLECYSTECTOMY     CORONARY ARTERY BYPASS GRAFT N/A 11/02/2015   Procedure: OFF PUMP CORONARY ARTERY BYPASS GRAFTING (CABG) TIMES ONE;  Surgeon: Rexene Alberts, MD;  Location: [place];  Service: Open Heart Surgery;  Laterality: N/A;  LIMA to LAD   FEMUR FRACTURE SURGERY  09/2015   right hip compression screws    FRACTURE SURGERY     Right hip   HERNIA REPAIR     Right inguinal   TEE WITHOUT CARDIOVERSION N/A 11/02/2015   Procedure: TRANSESOPHAGEAL ECHOCARDIOGRAM (TEE);  Surgeon: Rexene Alberts, MD;  Location: [place];  Service: Open Heart Surgery;  Laterality: N/A;     Current Outpatient Medications  Medication Sig Dispense Refill   acetaminophen (TYLENOL) 325 MG tablet Take 325 mg by mouth every 6 (six) hours as needed.     apixaban (ELIQUIS) 2.5  MG TABS tablet Take 1 tablet (2.5 mg total) by mouth 2 (two) times daily. 180 tablet 1   cholecalciferol (VITAMIN D3) 25 MCG (1000 UNIT) tablet Take 1,000 Units by mouth daily.     Cyanocobalamin (VITAMIN B-12 PO) Take 500 mcg by mouth. Take one tablet by mouth on Mon.,Wed., and Friday     diclofenac Sodium (VOLTAREN) 1 % GEL Apply 2 g topically 4 (four) times daily. 350 g 3   digoxin (LANOXIN) 0.125 MG tablet Take 1 tablet (0.125 mg total) by mouth daily. (Patient taking differently: Take 0.125 mg by mouth every other day.) 90 tablet 3   diltiazem (CARDIZEM) 30 MG tablet TAKE 1 TABLET BY MOUTH 2 TIMES DAILY. 180 tablet 3   famotidine (PEPCID) 20 MG tablet Take 1 tablet (20 mg total) by mouth 2 (two) times daily. 180 tablet 3   levothyroxine (SYNTHROID) 50 MCG tablet Take 1 tablet (50 mcg total) by mouth daily  before breakfast. 90 tablet 1   Multiple Vitamins-Minerals (PRESERVISION AREDS 2 PO) Take 1 capsule by mouth at bedtime.     PARoxetine (PAXIL) 20 MG tablet Take 1 tablet (20 mg total) by mouth daily. 90 tablet 3   QUEtiapine (SEROQUEL) 50 MG tablet Take 1 tablet (50 mg total) by mouth at bedtime. 90 tablet 3   No current facility-administered medications for this visit.    Allergies:   Nitrofurantoin, Nsaids, Other, Beta adrenergic blockers, Flonase [fluticasone propionate], Prevnar 13 [pneumococcal 13-val conj vacc], Propranolol hcl, Hydrocodone, Alendronate sodium, Ciprofloxacin, Codeine, Crestor [rosuvastatin calcium], Darvon, Lipitor [atorvastatin calcium], Nortriptyline hcl, Potassium-containing compounds, Pravachol, Sulfa antibiotics, Sulfamethoxazole, Ultram [tramadol hcl], Welchol [colesevelam hcl], Zetia [ezetimibe], and Zocor [simvastatin]    ROS:  Please see the history of present illness.   Otherwise, review of systems are positive for ***.   All other systems are reviewed and negative.    PHYSICAL EXAM: VS:  LMP 03/21/1971  , BMI There is no height or weight on file to calculate BMI.  GENERAL:  Well appearing NECK:  No jugular venous distention, waveform within normal limits, carotid upstroke brisk and symmetric, no bruits, no thyromegaly LUNGS:  Clear to auscultation bilaterally CHEST:  Unremarkable HEART:  PMI not displaced or sustained,S1 and S2 within normal limits, no S3, no S4, no clicks, no rubs, *** murmurs ABD:  Flat, positive bowel sounds normal in frequency in pitch, no bruits, no rebound, no guarding, no midline pulsatile mass, no hepatomegaly, no splenomegaly EXT:  2 plus pulses throughout, no edema, no cyanosis no clubbing    ***GENERAL:  Well appearing NECK:  No jugular venous distention, waveform within normal limits, carotid upstroke brisk and symmetric, no bruits, no thyromegaly LUNGS:  Clear to auscultation bilaterally CHEST:  Unremarkable HEART:  PMI  not displaced or sustained,S1 and S2 within normal limits, no S3, no clicks, no rubs, no murmurs, irregular  ABD:  Flat, positive bowel sounds normal in frequency in pitch, no bruits, no rebound, no guarding, no midline pulsatile mass, no hepatomegaly, no splenomegaly EXT:  2 plus pulses throughout, no edema, no cyanosis no clubbing   EKG:  EKG is *** ordered today. Atrial fibrillation ***  interventricular conduction delay, ST-T wave changes consistent with digoxin effect.  Incomplete left bundle branch block.  Unchanged from October EKG but new since March.   Recent Labs: 06/13/2022: TSH 1.840 10/08/2022: ALT 13; Magnesium 2.0 12/21/2022: BUN 30; Creatinine, Ser 1.20; Hemoglobin 12.2; Platelets 312; Potassium 4.0; Sodium 139    Lipid Panel  Component Value Date/Time   CHOL 198 10/06/2022 1237   TRIG 176 (H) 10/06/2022 1237   TRIG 162 (H) 06/24/2015 0918   HDL 36 (L) 10/06/2022 1237   HDL 44 06/24/2015 0918   CHOLHDL 5.5 (H) 10/06/2022 1237   CHOLHDL 5.4 10/31/2015 0215   VLDL 20 10/31/2015 0215   LDLCALC 130 (H) 10/06/2022 1237   LDLCALC 163 (H) 05/14/2014 1038      Wt Readings from Last 3 Encounters:  01/08/23 127 lb (57.6 kg)  01/01/23 127 lb (57.6 kg)  12/21/22 124 lb (56.2 kg)      Other studies Reviewed: Additional studies/ records that were reviewed today include:   *** Review of the above records demonstrates:    See elsewhere  ASSESSMENT AND PLAN:   CAD:   ***  Patient has no new symptoms.  No further cardiovascular testing.  Of note she has highlighted several passages and her CT that was done that demonstrates atherosclerotic disease in her vessels and I explained that this is not unexpected and known since she has had coronary artery disease.  She just participates with continued risk reduction.  ATRIAL FIB:      ***  She tolerates anticoagulation.  She has better rate control.  She tolerates anticoagulation.  I did check a digoxin level and it was less  than 0.8.  She has had good rate control.  She asked for cardioversion but there would be no symptomatic or significant benefit to this.  I do think she is in persistent fibrillation now which has been paroxysmal previously.    DYSLIPIDEMIA:   She has been intolerant of statins.   ***  No change in therapy.   HTN:  Her BP is *** at target.  No change in therapy.  LBBB:   ***  She has progressed to incomplete left bundle branch block.  However, she is not having any symptomatic bradycardia arrhythmias.  In the absence of other cardiovascular symptoms I do not think further cardiovascular testing is suggested for this.  I reviewed the physiology.   Current medicines are reviewed at length with the patient today.  The patient does not have concerns regarding medicines.  The following changes have been made: ***  Labs/ tests ordered today include:  ***  No orders of the defined types were placed in this encounter.    Disposition:   FU with me in *** months   Signed, Minus Breeding, MD  02/03/2023 4:33 PM    [facility]

## 2023-02-06 ENCOUNTER — Encounter: Payer: Self-pay | Admitting: Cardiology

## 2023-02-06 ENCOUNTER — Ambulatory Visit: Payer: Medicare Other | Attending: Cardiology | Admitting: Cardiology

## 2023-02-06 VITALS — BP 136/74 | HR 92 | Ht 64.0 in | Wt 127.0 lb

## 2023-02-06 DIAGNOSIS — I4819 Other persistent atrial fibrillation: Secondary | ICD-10-CM | POA: Diagnosis not present

## 2023-02-06 DIAGNOSIS — I447 Left bundle-branch block, unspecified: Secondary | ICD-10-CM

## 2023-02-06 DIAGNOSIS — E785 Hyperlipidemia, unspecified: Secondary | ICD-10-CM

## 2023-02-06 DIAGNOSIS — I10 Essential (primary) hypertension: Secondary | ICD-10-CM

## 2023-02-06 DIAGNOSIS — I2510 Atherosclerotic heart disease of native coronary artery without angina pectoris: Secondary | ICD-10-CM | POA: Diagnosis not present

## 2023-02-06 MED ORDER — ISOSORBIDE MONONITRATE ER 30 MG PO TB24: 30.0000 mg | ORAL_TABLET | Freq: Every day | ORAL | 3 refills | Status: DC

## 2023-02-06 NOTE — Patient Instructions (Signed)
Medication Instructions:   START ISOSORBIDE 30 MG ONCE DAILY  *If you need a refill on your cardiac medications before your next appointment, please call your pharmacy*   Follow-Up: At [facility], you and your health needs are our priority.  As part of our continuing mission to provide you with exceptional heart care, we have created designated Provider Care Teams.  These Care Teams include your primary Cardiologist (physician) and Advanced Practice Providers (APPs -  Physician Assistants and Nurse Practitioners) who all work together to provide you with the care you need, when you need it.  We recommend signing up for the patient portal called "MyChart".  Sign up information is provided on this After Visit Summary.  MyChart is used to connect with patients for Virtual Visits (Telemedicine).  Patients are able to view lab/test results, encounter notes, upcoming appointments, etc.  Non-urgent messages can be sent to your provider as well.   To learn more about what you can do with MyChart, go to [email or web address].    Your next appointment:   2 month(s)  Provider:   Minus Breeding MD

## 2023-02-07 NOTE — Telephone Encounter (Signed)
**Note De-Identified  Obfuscation** The pt had a f/u with Dr Percival Spanish yesterday and asked about her BMSPAF application for Eliquis.  I called [employer]MS[employer] this morning and was advised by Joseph Art that they denied the pt for Eliquis assistance and that they mailed her a letter advising her that: 1. She has not met her 3% out of pocket RX expense amount for 2024 which is $190.71  and  2. She did not meet income guidelines and needs to apply for "[place]"  and if denied she needs to send her denial letter from them to [facility] and her application will be reconsidered and most likely approved.  I called the pt. and made her aware of this denial.  She states that she left her RX out of pocket report at our [place] office a couple weeks ago that shows she has met her 3% out of pocket requirement and she states that she does not recall the letter mentioning anything about her needing to apply for "Low In come Subsidy".  I gave her the phone number to reach out to [facility] to apply for [employer]. She is aware to let them know that she is in need of SHIIP assistance ([employer] [place] Program).  She is aware that if they deny her too bring her [place] denial letter to [name] and we will fax it to [employer] who will then reconsider her application and will most likely approve her for Eliquis assistance.  She verbalized understanding to all information given and thanked me for calling her with update on her application and explaining the [facility] program and her need to apply per [employer]MS[employer].

## 2023-02-15 DIAGNOSIS — M79641 Pain in right hand: Secondary | ICD-10-CM | POA: Diagnosis not present

## 2023-02-15 DIAGNOSIS — S8262XA Displaced fracture of lateral malleolus of left fibula, initial encounter for closed fracture: Secondary | ICD-10-CM | POA: Diagnosis not present

## 2023-02-19 DIAGNOSIS — M79641 Pain in right hand: Secondary | ICD-10-CM | POA: Diagnosis not present

## 2023-02-28 ENCOUNTER — Telehealth: Payer: Self-pay | Admitting: Cardiology

## 2023-02-28 NOTE — Telephone Encounter (Signed)
Pt c/o medication issue:  1. Name of Medication: Nitroglycerin  2. How are you currently taking this medication (dosage and times per day)?   3. Are you having a reaction (difficulty breathing--STAT)? No  4. What is your medication issue? Pt states that medication is causing her to have headaches. Pt would like a callback regarding this matter.

## 2023-02-28 NOTE — Telephone Encounter (Signed)
Returned call to patient left message on personal voice mail to call back. 

## 2023-02-28 NOTE — Telephone Encounter (Signed)
Received a call back from patient.Stated she cannot take Imdur 30 mg 1/2 tablet,causes a severe headache.Advised to stop taking.I will send message to Dr.Hochrein.

## 2023-03-02 NOTE — Telephone Encounter (Signed)
Spoke to patient Dr.Hochrein agreed to stop Imdur.Stated no headache since she stopped taking.

## 2023-03-05 ENCOUNTER — Ambulatory Visit: Payer: Medicare Other | Admitting: Cardiology

## 2023-03-07 ENCOUNTER — Ambulatory Visit: Payer: Medicare Other | Admitting: Cardiology

## 2023-03-22 ENCOUNTER — Ambulatory Visit (INDEPENDENT_AMBULATORY_CARE_PROVIDER_SITE_OTHER): Payer: Medicare Other | Admitting: Family Medicine

## 2023-03-22 ENCOUNTER — Encounter: Payer: Self-pay | Admitting: Family Medicine

## 2023-03-22 VITALS — BP 130/70 | HR 91 | Temp 98.5°F | Ht 64.0 in | Wt 125.5 lb

## 2023-03-22 DIAGNOSIS — N3001 Acute cystitis with hematuria: Secondary | ICD-10-CM | POA: Diagnosis not present

## 2023-03-22 DIAGNOSIS — Z85828 Personal history of other malignant neoplasm of skin: Secondary | ICD-10-CM | POA: Diagnosis not present

## 2023-03-22 DIAGNOSIS — Z08 Encounter for follow-up examination after completed treatment for malignant neoplasm: Secondary | ICD-10-CM | POA: Diagnosis not present

## 2023-03-22 DIAGNOSIS — R300 Dysuria: Secondary | ICD-10-CM | POA: Diagnosis not present

## 2023-03-22 DIAGNOSIS — X32XXXD Exposure to sunlight, subsequent encounter: Secondary | ICD-10-CM | POA: Diagnosis not present

## 2023-03-22 DIAGNOSIS — L570 Actinic keratosis: Secondary | ICD-10-CM | POA: Diagnosis not present

## 2023-03-22 LAB — URINALYSIS, ROUTINE W REFLEX MICROSCOPIC
Bilirubin, UA: NEGATIVE
Glucose, UA: NEGATIVE
Nitrite, UA: POSITIVE — AB
Specific Gravity, UA: 1.025 (ref 1.005–1.030)
Urobilinogen, Ur: 0.2 mg/dL (ref 0.2–1.0)
pH, UA: 6.5 (ref 5.0–7.5)

## 2023-03-22 LAB — MICROSCOPIC EXAMINATION
Epithelial Cells (non renal): NONE SEEN /hpf (ref 0–10)
RBC, Urine: >30 /hpf — AB (ref 0–2)
Renal Epithel, UA: NONE SEEN /hpf
WBC, UA: >30 /hpf — AB (ref 0–5)

## 2023-03-22 MED ORDER — CEPHALEXIN 500 MG PO CAPS: 500.0000 mg | ORAL_CAPSULE | Freq: Two times a day (BID) | ORAL | 0 refills | Status: DC

## 2023-03-22 NOTE — Progress Notes (Signed)
   Acute Office Visit  Subjective:     Patient ID: Emily Wagner, female    DOB: Jun 28, 1933, 89 y.o.   MRN: [id number]  Chief Complaint  Patient presents with   Dysuria    Dysuria  This is a new problem. Episode onset: 2 weeks. The problem occurs every urination. The problem has been gradually worsening. The quality of the pain is described as burning. There has been no fever. She is Not sexually active. Associated symptoms include frequency. Pertinent negatives include no chills, discharge, flank pain, hematuria, hesitancy, nausea, possible pregnancy, sweats, urgency or vomiting. She has tried increased fluids for the symptoms. The treatment provided no relief. Her past medical history is significant for recurrent UTIs.     Review of Systems  Constitutional:  Negative for chills.  Gastrointestinal:  Negative for nausea and vomiting.  Genitourinary:  Positive for dysuria and frequency. Negative for flank pain, hematuria, hesitancy and urgency.        Objective:    BP 130/70 Comment: at home reading per pt  Pulse 91   Temp 98.5 F (36.9 C) (Temporal)   Ht  (1.626 m)   Wt 125 lb 8 oz (56.9 kg)   LMP 03/21/1971   SpO2 94%   BMI 21.54 kg/m    Physical Exam Vitals and nursing note reviewed.  Constitutional:      General: She is not in acute distress.    Appearance: She is not ill-appearing, toxic-appearing or diaphoretic.  Cardiovascular:     Rate and Rhythm: Normal rate and regular rhythm.     Heart sounds: Normal heart sounds. No murmur heard. Pulmonary:     Effort: Pulmonary effort is normal. No respiratory distress.     Breath sounds: Normal breath sounds.  Abdominal:     General: Bowel sounds are normal. There is no distension.     Palpations: Abdomen is soft.     Tenderness: There is no abdominal tenderness. There is no right CVA tenderness, left CVA tenderness, guarding or rebound.  Musculoskeletal:     Right lower leg: No edema.     Left lower leg:  No edema.  Skin:    General: Skin is warm and dry.  Neurological:     Mental Status: She is alert. Mental status is at baseline.  Psychiatric:        Mood and Affect: Mood normal.        Behavior: Behavior normal.    Urine dipstick shows positive for RBC's, positive for protein, positive for nitrates, positive for leukocytes, and positive for ketones.  Micro exam: >30 WBC's per HPF, >30 RBC's per HPF, and many + bacteria.  No results found for any visits on 03/22/23.      Assessment & Plan:    Nzinga was seen today for dysuria.  Diagnoses and all orders for this visit:  Acute cystitis with hematuria Keflex ordered today pending culture. Repeat UA in 3 weeks to ensure resolution of hematuria. Return to office for new or worsening symptoms, or if symptoms persist.  -     Urinalysis, Routine w reflex microscopic -     cephALEXin (KEFLEX) 500 MG capsule; Take 1 capsule (500 mg total) by mouth 2 (two) times daily. -     Urinalysis, Routine w reflex microscopic; Future   The patient indicates understanding of these issues and agrees with the plan.  Gabriel Earing, FNP

## 2023-04-09 ENCOUNTER — Telehealth: Payer: Self-pay | Admitting: Family Medicine

## 2023-04-09 NOTE — Telephone Encounter (Signed)
Appt made patient aware  °

## 2023-04-10 ENCOUNTER — Encounter: Payer: Self-pay | Admitting: Family Medicine

## 2023-04-10 ENCOUNTER — Ambulatory Visit (INDEPENDENT_AMBULATORY_CARE_PROVIDER_SITE_OTHER): Payer: Medicare Other | Admitting: Family Medicine

## 2023-04-10 VITALS — BP 142/98 | HR 70 | Temp 98.5°F | Ht 64.0 in | Wt 125.0 lb

## 2023-04-10 DIAGNOSIS — Z8619 Personal history of other infectious and parasitic diseases: Secondary | ICD-10-CM | POA: Diagnosis not present

## 2023-04-10 DIAGNOSIS — S40862A Insect bite (nonvenomous) of left upper arm, initial encounter: Secondary | ICD-10-CM

## 2023-04-10 DIAGNOSIS — N390 Urinary tract infection, site not specified: Secondary | ICD-10-CM | POA: Diagnosis not present

## 2023-04-10 DIAGNOSIS — R300 Dysuria: Secondary | ICD-10-CM | POA: Diagnosis not present

## 2023-04-10 DIAGNOSIS — Z87440 Personal history of urinary (tract) infections: Secondary | ICD-10-CM

## 2023-04-10 DIAGNOSIS — W57XXXA Bitten or stung by nonvenomous insect and other nonvenomous arthropods, initial encounter: Secondary | ICD-10-CM

## 2023-04-10 LAB — URINALYSIS
Bilirubin, UA: NEGATIVE
Glucose, UA: NEGATIVE
Nitrite, UA: NEGATIVE
Specific Gravity, UA: 1.015 (ref 1.005–1.030)
Urobilinogen, Ur: 0.2 mg/dL (ref 0.2–1.0)
pH, UA: 6.5 (ref 5.0–7.5)

## 2023-04-10 NOTE — Patient Instructions (Addendum)
Urine doesn't show bacterial byproducts but does show some white blood cells (this is your body fighting off something but may be a result of the recent uti you had).  I'm going to culture it.  If anything grows, we will need to repeat antibiotics.  We should have a result in a few days. Again, I do not believe you have [place] spotted fever.  Typically, you would have had the spotted rash and fever by now.  If no symptoms appear by the end of the week, you should be totally in the clear.  [place] Spotted Fever [place] spotted fever is an infection caused by bacteria that spreads to people through the bite of certain ticks. The illness causes flu-like symptoms and a reddish-purple rash. The illness does not spread from person to person (is not contagious). When this condition is not treated right away, it can quickly become very serious. It can sometimes lead to long-term (chronic) health problems and can be life-threatening. What are the causes? This condition is caused by a type of bacteria (Rickettsia rickettsii) that is carried by [place] dog ticks, brown dog ticks, and [employer] wood ticks. The infection spreads through: A bite from an infected tick. Tick bites are usually painless, and they frequently are not noticed. Infected tick blood, body fluids, or feces that get into the body through damaged skin, such as a small cut or sore. This could happen while removing a tick from a pet or from another person. What increases the risk? The following factors may make you more likely to develop this condition: Spending a lot of time outdoors, especially in rural areas or areas with long grass or woods with high brush. Spending time outdoors during warm weather. Ticks are most active during warm weather. What are the signs or symptoms? Symptoms of this condition include: Fever. Headache. Eye redness or sensitivity to light. There may also be swelling around the eyes. A  reddish-purple rash. This usually appears 2-4 days after the first symptoms begin. The rash often starts on the wrists, forearms, and ankles. It may then spread to the palms, the bottom of the feet, legs, and trunk. Muscle aches. Nausea or vomiting. Poor appetite. Abdominal pain. Symptoms may develop 2-14 days after a tick bite. How is this diagnosed? This condition is diagnosed based on: Your medical history. A physical exam. Blood tests. Whether you have recently been bitten by a tick or spent time in areas where: Ticks are common. [facility] spotted fever is common. You may also have a skin biopsy if you have a rash. A skin biopsy is a procedure to remove a sample of your skin so that it can be checked under a microscope. How is this treated? This condition is treated with antibiotic medicines. It is important to begin treatment right away. In some cases, your health care provider may begin treatment before the diagnosis is confirmed. If your symptoms are severe, you may need to be treated in the hospital where you can get antibiotics and be monitored during treatment. Follow these instructions at home: Take over-the-counter and prescription medicines only as told by your health care provider. Take your antibiotic medicine as told by your health care provider. Do not stop taking the antibiotic even if you start to feel better. Rest as much as possible until you feel better. Return to your normal activities as told by your health care provider. Ask your health care provider what activities are safe for you. Drink  enough fluid to keep your urine pale yellow. Keep all follow-up visits. This is important. Contact a health care provider if: You have a rash that gets more red or swollen. You have fluid draining from any areas of your rash. You develop a fever after being bitten by a tick. You develop a rash 2-4 days after experiencing flu-like symptoms. You bruise easily. You have  bleeding from your gums. You have numbness or tingling in your arms or legs. Get help right away if: You have chest pain. You have shortness of breath. You have a severe headache. You have vision or hearing problems. You feel confused. You have a seizure. You have severe abdominal pain. You have blood in your stool (feces). You are not able to control when you urinate or have bowel movements (incontinence). These symptoms may represent a serious problem that is an emergency. Do not wait to see if the symptoms will go away. Get medical help right away. Call your local emergency services (911 in the [place] Do not drive yourself to the hospital. Summary [facility] spotted fever is a bacterial infection that spreads to people through contact with certain ticks. When this condition is not treated right away, it can quickly become very serious. Sometimes, it can lead to long-term (chronic) health problems and can be life-threatening. You are more likely to develop this infection if you spend time outdoors in warm weather and in areas with tall grass or brush. Symptoms of this condition include fever, headache, nausea, vomiting, abdominal pain, muscle aches, and a reddish-purple rash that usually appears 2-4 days after a fever. This condition is treated with antibiotic medicines. This information is not intended to replace advice given to you by your health care provider. Make sure you discuss any questions you have with your health care provider. Document Revised: 10/23/2020 Document Reviewed: 10/23/2020 Elsevier Patient Education  2023 [employer].

## 2023-04-10 NOTE — Progress Notes (Signed)
Subjective: WU:JWJX bite PCP: [name], Elige Radon, MD BJY:NWGNF Emily Wagner is a 89 y.o. female presenting to clinic today for:  1. Tick bite Patient is brought to the office by her daughter.  She reports that her mom has been bitten by a couple ticks on her arm last week.  She has history of RMSF and they were concerned about her getting it again.  She has had no rash, new/ worsening joint/muscle pain, fevers.  There is slight redness around the area of bite.  2. Recent UTI Also brings a urine specimen as she continues to have cloudy urine despite recent treatment with Keflex.  No fevers, flank pain, dysuria.  She has chronic microscopic blood (which has been worked up by Dr Annabell Howells previously) but no gross blood.   ROS: Per HPI  Allergies  Allergen Reactions   Nitrofurantoin Other (See Comments)    Liver failure   Nsaids Other (See Comments)    Kidney failure   Other Other (See Comments)    Most antibiotics require supervision per patient   Beta Adrenergic Blockers Other (See Comments)   Flonase [Fluticasone Propionate] Other (See Comments)    headache   Prevnar 13 [Pneumococcal 13-Val Conj Vacc] Other (See Comments)    Nerve damage in left arm   Propranolol Hcl Other (See Comments)    Unknown   Hydrocodone Other (See Comments)   Alendronate Sodium Other (See Comments)   Ciprofloxacin Other (See Comments)    Patient denies allergy--"I take Cipro for bladder infections"   Codeine Other (See Comments)   Crestor [Rosuvastatin Calcium] Other (See Comments)   Darvon Other (See Comments)   Lipitor [Atorvastatin Calcium] Other (See Comments)   Nortriptyline Hcl Other (See Comments)    States she felt like worms where crawling under her skin.   Potassium-Containing Compounds     Rash and raw areas / sore breast    Pravachol Other (See Comments)   Sulfa Antibiotics Other (See Comments)   Sulfamethoxazole Other (See Comments)    Unknown   Ultram [Tramadol Hcl] Itching    Welchol [Colesevelam Hcl] Other (See Comments)   Zetia [Ezetimibe] Other (See Comments)   Zocor [Simvastatin] Other (See Comments)   Past Medical History:  Diagnosis Date   Adjustment disorder with mixed anxiety and depressed mood    Atrial fibrillation (HCC) 11/04/2015   Post-operative   Benign hypertensive heart disease without heart failure    CAD (coronary artery disease)    Infection of urinary tract 10/31/2015   Interstitial cystitis    Osteopenia    Other and unspecified hyperlipidemia    Other dyspnea and respiratory abnormality    Pelvic fracture (HCC) 05/30/2015   Post-polio syndrome    polio age 55   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   LIMA to LAD   Stroke [facility])     Current Outpatient Medications:    acetaminophen (TYLENOL) 325 MG tablet, Take 325 mg by mouth every 6 (six) hours as needed., Disp: , Rfl:    apixaban (ELIQUIS) 2.5 MG TABS tablet, Take 1 tablet (2.5 mg total) by mouth 2 (two) times daily., Disp: 180 tablet, Rfl: 1   cephALEXin (KEFLEX) 500 MG capsule, Take 1 capsule (500 mg total) by mouth 2 (two) times daily., Disp: 14 capsule, Rfl: 0   cholecalciferol (VITAMIN D3) 25 MCG (1000 UNIT) tablet, Take 1,000 Units by mouth daily., Disp: , Rfl:    Cyanocobalamin (VITAMIN B-12 PO), Take 500 mcg by mouth. Take one  tablet by mouth on Mon.,Wed., and Friday, Disp: , Rfl:    digoxin (LANOXIN) 0.125 MG tablet, Take 1 tablet (0.125 mg total) by mouth daily. (Patient taking differently: Take 0.125 mg by mouth every other day.), Disp: 90 tablet, Rfl: 3   diltiazem (CARDIZEM) 30 MG tablet, TAKE 1 TABLET BY MOUTH 2 TIMES DAILY., Disp: 180 tablet, Rfl: 3   famotidine (PEPCID) 20 MG tablet, Take 1 tablet (20 mg total) by mouth 2 (two) times daily., Disp: 180 tablet, Rfl: 3   isosorbide mononitrate (IMDUR) 30 MG 24 hr tablet, Take 1 tablet (30 mg total) by mouth daily., Disp: 90 tablet, Rfl: 3   levothyroxine (SYNTHROID) 50 MCG tablet, Take 1 tablet (50 mcg total) by mouth daily  before breakfast., Disp: 90 tablet, Rfl: 1   Multiple Vitamins-Minerals (PRESERVISION AREDS 2 PO), Take 1 capsule by mouth at bedtime., Disp: , Rfl:    PARoxetine (PAXIL) 20 MG tablet, Take 1 tablet (20 mg total) by mouth daily., Disp: 90 tablet, Rfl: 3   QUEtiapine (SEROQUEL) 50 MG tablet, Take 1 tablet (50 mg total) by mouth at bedtime., Disp: 90 tablet, Rfl: 3 Social History   Socioeconomic History   Marital status: Widowed    Spouse name: Not on file   Number of children: 2   Years of education: Not on file   Highest education level: High school graduate  Occupational History   Occupation: retired    Associate Professor: [employer]    Comment: admin. assistant  Tobacco Use   Smoking status: Never   Smokeless tobacco: Never  Vaping Use   Vaping Use: Never used  Substance and Sexual Activity   Alcohol use: No   Drug use: No   Sexual activity: Not on file  Other Topics Concern   Not on file  Social History Narrative   01/04/22 lives alone   Daughter and granddaughter live close by and visit daily   Right Handed   Drinks no caffeine daily   Social Determinants of Health   Financial Resource Strain: Medium Risk (01/08/2023)   Overall Financial Resource Strain (CARDIA)    Difficulty of Paying Living Expenses: Somewhat hard  Food Insecurity: No Food Insecurity (01/08/2023)   Hunger Vital Sign    Worried About Running Out of Food in the Last Year: Never true    Ran Out of Food in the Last Year: Never true  Transportation Needs: No Transportation Needs (01/08/2023)   PRAPARE - Administrator, Civil Service (Medical): No    Lack of Transportation (Non-Medical): No  Physical Activity: Inactive (01/08/2023)   Exercise Vital Sign    Days of Exercise per Week: 0 days    Minutes of Exercise per Session: 0 min  Stress: No Stress Concern Present (01/08/2023)   [employer] of Occupational Health - Occupational Stress Questionnaire    Feeling of Stress : Not at all  Social  Connections: Moderately Integrated (01/08/2023)   Social Connection and Isolation Panel [NHANES]    Frequency of Communication with Friends and Family: More than three times a week    Frequency of Social Gatherings with Friends and Family: More than three times a week    Attends Religious Services: More than 4 times per year    Active Member of [employer] or Organizations: Yes    Attends Banker Meetings: More than 4 times per year    Marital Status: Widowed  Intimate Partner Violence: Not At Risk (01/08/2023)   Humiliation, Afraid, Rape,  and Kick questionnaire    Fear of Current or Ex-Partner: No    Emotionally Abused: No    Physically Abused: No    Sexually Abused: No   Family History  Problem Relation Age of Onset   CAD Mother 81       Died suddenly   CAD Father 31       Died suddenly   CAD Daughter 33   Diabetes type I Daughter        Since age 2   Heart disease Sister    Osteoporosis Sister     Objective: Office vital signs reviewed. BP (!) 142/98   Pulse 70   Temp 98.5 F (36.9 C)   Ht 5\' 4"  (1.626 m)   Wt 125 lb (56.7 kg)   LMP 03/21/1971   SpO2 95%   BMI 21.46 kg/m   Physical Examination:  General: Awake, alert, well nourished elderly female, No acute distress HEENT: sclera white Cardio: Irregularly irregular with rate controlled.  S1S2 heard, no murmurs appreciated Pulm: clear to auscultation bilaterally, no wheezes, rhonchi or rales; normal work of breathing on room air Skin: She has 2 insect bites appreciated on the left upper extremity.  There is no petechiae/purpura/bull's-eye lesion or other rash-like lesions of concern.  She has no joint swelling, erythema  Assessment/ Plan: 89 y.o. female   Recent urinary tract infection - Plan: Urinalysis, Urine Culture  Tick bite of left upper arm, initial encounter  History of [facility] spotted fever  Urinalysis does show leukocytes but no nitrites.  Will send for urine culture to further  evaluate.  If persistent growth, we will repeat antibiotics but I would urge reevaluation with urology to consider prophylactic therapy  The lesion on the left arm does not appear to be complicated at this point.  She has no signs or symptoms suggestive of Lyme disease or [place] spotted fever.  We discussed that she is near the end of the time spent in which she would present with symptoms but I gave her handout reiterating what the symptoms would be and the timing and what she should be in the clear.  She may follow-up if symptoms arise and we will be glad to treat  Additionally, despite manual repeat of blood pressure her diastolic blood pressure remained elevated.  Recommend follow-up with PCP in the next few weeks for recheck  Orders Placed This Encounter  Procedures   Urinalysis   No orders of the defined types were placed in this encounter.    Raliegh Ip, DO [facility] [phone]

## 2023-04-13 ENCOUNTER — Other Ambulatory Visit: Payer: Self-pay | Admitting: Family Medicine

## 2023-04-13 DIAGNOSIS — Z8619 Personal history of other infectious and parasitic diseases: Secondary | ICD-10-CM

## 2023-04-13 DIAGNOSIS — N3001 Acute cystitis with hematuria: Secondary | ICD-10-CM

## 2023-04-13 DIAGNOSIS — W57XXXA Bitten or stung by nonvenomous insect and other nonvenomous arthropods, initial encounter: Secondary | ICD-10-CM

## 2023-04-13 LAB — URINE CULTURE

## 2023-04-13 MED ORDER — CEPHALEXIN 500 MG PO CAPS: 500.0000 mg | ORAL_CAPSULE | Freq: Three times a day (TID) | ORAL | 0 refills | Status: AC

## 2023-04-13 MED ORDER — DOXYCYCLINE HYCLATE 100 MG PO TABS: 100.0000 mg | ORAL_TABLET | Freq: Two times a day (BID) | ORAL | 0 refills | Status: AC

## 2023-04-15 NOTE — Progress Notes (Unsigned)
  Cardiology Office Note:   Date:  04/18/2023  ID:  Emily Wagner, DOB 11/18/1933, MRN [id number]  History of Present Illness:   Emily Wagner is a 89 y.o. female who presents for evaluation of atrial fib.     She has a history of  bypass surgery.  She has had fluctuating BPs.  She has a diagnosis of post polio syndrome.   She has chronic fatigue.   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  She was hospitalized May of last year for this.   She had increased ventricular rate and so I added digoxin.  She was added to a DOD schedule recently because of fatigue and palpitations.  This was managed conservatively.   Since I saw her she saw her Dr.Acharya in January for chest pain.  She was managed conservatively and she did not want nuclear testing.  She comes in today with again multiple somatic complaints but she says she is being treated for [facility] spotted fever and she had a urinary infection recently.  She has fatigue mostly.  She thinks her chest pain is better.  She is really not complaining of her atrial fibrillation.  She is just tired.  She brings in blood pressures that are kind of illegible and probably not accurate.  Most of the blood pressures I see recorded in the chart are reasonably well-controlled.  There is an isolated 189 systolic.  ROS: As stated in the HPI and negative for all other systems.  Studies Reviewed:    EKG:  NA    Risk Assessment/Calculations:    CHA2DS2-VASc Score = 5  1} This indicates a 7.2% annual risk of stroke. The patient's score is based upon:   Physical Exam:   VS:  BP (!) 148/96   Pulse 96   Ht 5\' 4"  (1.626 m)   Wt 123 lb (55.8 kg)   LMP 03/21/1971   BMI 21.11 kg/m    Wt Readings from Last 3 Encounters:  04/18/23 123 lb (55.8 kg)  04/10/23 125 lb (56.7 kg)  03/22/23 125 lb 8 oz (56.9 kg)     GEN: Well nourished, well developed in no acute distress NECK: No JVD; No carotid bruits CARDIAC: Irregular RR, no murmurs, rubs,  gallops RESPIRATORY:  Clear to auscultation without rales, wheezing or rhonchi  ABDOMEN: Soft, non-tender, non-distended EXTREMITIES:  No edema; No deformity   ASSESSMENT AND PLAN:   CAD: The patient has no new sypmtoms.  No further cardiovascular testing is indicated.  We will continue with aggressive risk reduction and meds as listed.  Of note she could not tolerate isosorbide   ATRIAL FIB:      She tolerates anticoagulation.  She has good rate control.  No change in therapy.  DYSLIPIDEMIA:    She has not tolerated statins.   HTN:  Her BP is difficult to interpret at home.  She is going to have a nurse come from church to help her take some blood pressures and make sure her machine is accurate and teach her how to keep a record so that I can review these.   LBBB:    This seems to be rate related as the most recent EKG on 3/13 that I reviewed narrow complex      Signed, [name], MD

## 2023-04-17 DIAGNOSIS — B351 Tinea unguium: Secondary | ICD-10-CM | POA: Diagnosis not present

## 2023-04-17 DIAGNOSIS — M79676 Pain in unspecified toe(s): Secondary | ICD-10-CM | POA: Diagnosis not present

## 2023-04-18 ENCOUNTER — Encounter: Payer: Self-pay | Admitting: Cardiology

## 2023-04-18 ENCOUNTER — Ambulatory Visit (INDEPENDENT_AMBULATORY_CARE_PROVIDER_SITE_OTHER): Payer: Medicare Other | Admitting: Cardiology

## 2023-04-18 VITALS — BP 148/96 | HR 96 | Ht 64.0 in | Wt 123.0 lb

## 2023-04-18 DIAGNOSIS — I2510 Atherosclerotic heart disease of native coronary artery without angina pectoris: Secondary | ICD-10-CM

## 2023-04-18 DIAGNOSIS — E785 Hyperlipidemia, unspecified: Secondary | ICD-10-CM | POA: Diagnosis not present

## 2023-04-18 DIAGNOSIS — I482 Chronic atrial fibrillation, unspecified: Secondary | ICD-10-CM | POA: Diagnosis not present

## 2023-04-18 DIAGNOSIS — I10 Essential (primary) hypertension: Secondary | ICD-10-CM

## 2023-04-18 NOTE — Patient Instructions (Signed)
Medication Instructions:  The current medical regimen is effective;  continue present plan and medications.  *If you need a refill on your cardiac medications before your next appointment, please call your pharmacy*  Follow-Up: At  [facility], you and your health needs are our priority.  As part of our continuing mission to provide you with exceptional heart care, we have created designated Provider Care Teams.  These Care Teams include your primary Cardiologist (physician) and Advanced Practice Providers (APPs -  Physician Assistants and Nurse Practitioners) who all work together to provide you with the care you need, when you need it.  We recommend signing up for the patient portal called "MyChart".  Sign up information is provided on this After Visit Summary.  MyChart is used to connect with patients for Virtual Visits (Telemedicine).  Patients are able to view lab/test results, encounter notes, upcoming appointments, etc.  Non-urgent messages can be sent to your provider as well.   To learn more about what you can do with MyChart, go to [email or web address].    Your next appointment:   1 year(s)  Provider:   James Hochrein, MD     

## 2023-05-16 ENCOUNTER — Encounter: Payer: Self-pay | Admitting: Family Medicine

## 2023-05-16 ENCOUNTER — Ambulatory Visit (INDEPENDENT_AMBULATORY_CARE_PROVIDER_SITE_OTHER): Payer: Medicare Other | Admitting: Family Medicine

## 2023-05-16 VITALS — BP 138/92 | HR 99 | Temp 97.9°F | Ht 64.0 in | Wt 125.0 lb

## 2023-05-16 DIAGNOSIS — N3000 Acute cystitis without hematuria: Secondary | ICD-10-CM | POA: Diagnosis not present

## 2023-05-16 DIAGNOSIS — R300 Dysuria: Secondary | ICD-10-CM | POA: Diagnosis not present

## 2023-05-16 LAB — URINALYSIS, ROUTINE W REFLEX MICROSCOPIC
Bilirubin, UA: NEGATIVE
Glucose, UA: NEGATIVE
Nitrite, UA: NEGATIVE
RBC, UA: NEGATIVE
Specific Gravity, UA: 1.02 (ref 1.005–1.030)
Urobilinogen, Ur: 0.2 mg/dL (ref 0.2–1.0)
pH, UA: 6 (ref 5.0–7.5)

## 2023-05-16 LAB — MICROSCOPIC EXAMINATION: Renal Epithel, UA: NONE SEEN /hpf

## 2023-05-16 MED ORDER — AMOXICILLIN-POT CLAVULANATE 875-125 MG PO TABS
1.0000 | ORAL_TABLET | Freq: Two times a day (BID) | ORAL | 0 refills | Status: AC
Start: 2023-05-16 — End: 2023-05-26

## 2023-05-16 NOTE — Progress Notes (Signed)
Subjective:  Patient ID: Emily Wagner, female    DOB: [birth year], 89 y.o.   MRN: [id number]  Patient Care Team: [name], Elige Radon, MD as PCP - General (Family Medicine) [name], MD (Cardiology) Adam Phenix, DPM as Consulting Physician (Podiatry) [facility]   Chief Complaint:  Hallucinations (2 years ago, ecoli UTI caused hallucinations/Another UTI dx in May, tried two types of antibiotics /-see and hear things no one else sees/-issues with days of the week and date)   HPI: Emily Wagner is a 89 y.o. female presenting on 05/16/2023 for Hallucinations (2 years ago, ecoli UTI caused hallucinations/Another UTI dx in May, tried two types of antibiotics /-see and hear things no one else sees/-issues with days of the week and date)   Pt presents today with her daughter for evaluation for possible UTI. States she was recently treated for an UTI and is concerned it has returned. Daughter states she is having confusion and hallucinations which occurred the last time she had an UTI. Pt reports frequency and dysuria. No fever, chills, abdominal pain, or flank pain.      Relevant past medical, surgical, family, and social history reviewed and updated as indicated.  Allergies and medications reviewed and updated. Data reviewed: Chart in Epic.   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   LIMA to LAD   Stroke [facility])     Past Surgical History:  Procedure Laterality Date    ABDOMINAL HYSTERECTOMY     Cancer and endometriosis   CARDIAC CATHETERIZATION  2008   CARDIAC CATHETERIZATION N/A 10/29/2015   Procedure: Left Heart Cath and Coronary Angiography;  Surgeon: Lyn Records, MD;  Location: [facility] INVASIVE CV LAB;  Service: Cardiovascular;  Laterality: N/A;   CATARACT EXTRACTION     CHOLECYSTECTOMY     CORONARY ARTERY BYPASS GRAFT N/A 11/02/2015   Procedure: OFF PUMP CORONARY ARTERY BYPASS GRAFTING (CABG) TIMES ONE;  Surgeon: Purcell Nails, MD;  Location: [facility] OR;  Service: Open Heart Surgery;  Laterality: N/A;  LIMA to LAD   FEMUR FRACTURE SURGERY  09/2015   right hip compression screws    FRACTURE SURGERY     Right hip   HERNIA REPAIR     Right inguinal   TEE WITHOUT CARDIOVERSION N/A 11/02/2015   Procedure: TRANSESOPHAGEAL ECHOCARDIOGRAM (TEE);  Surgeon: Purcell Nails, MD;  Location: [facility] OR;  Service: Open Heart Surgery;  Laterality: N/A;    Social History   Socioeconomic History   Marital status: Widowed    Spouse name: Not on file   Number of children: 2   Years of education: Not on file   Highest education level: High school graduate  Occupational History   Occupation: retired    Associate Professor: [employer]    Comment: admin. assistant  Tobacco Use   Smoking status: Never   Smokeless tobacco: Never  Vaping Use   Vaping Use: Never used  Substance  and Sexual Activity   Alcohol use: No   Drug use: No   Sexual activity: Not on file  Other Topics Concern   Not on file  Social History Narrative   01/04/22 lives alone   Daughter and granddaughter live close by and visit daily   Right Handed   Drinks no caffeine daily   Social Determinants of Health   Financial Resource Strain: Medium Risk (01/08/2023)   Overall Financial Resource Strain (CARDIA)    Difficulty of Paying Living Expenses: Somewhat hard  Food Insecurity: No Food Insecurity (01/08/2023)   Hunger Vital Sign    Worried About Running Out of Food in the Last Year: Never true    Ran Out of  Food in the Last Year: Never true  Transportation Needs: No Transportation Needs (01/08/2023)   PRAPARE - Administrator, Civil Service (Medical): No    Lack of Transportation (Non-Medical): No  Physical Activity: Inactive (01/08/2023)   Exercise Vital Sign    Days of Exercise per Week: 0 days    Minutes of Exercise per Session: 0 min  Stress: No Stress Concern Present (01/08/2023)   [employer] of Occupational Health - Occupational Stress Questionnaire    Feeling of Stress : Not at all  Social Connections: Moderately Integrated (01/08/2023)   Social Connection and Isolation Panel [NHANES]    Frequency of Communication with Friends and Family: More than three times a week    Frequency of Social Gatherings with Friends and Family: More than three times a week    Attends Religious Services: More than 4 times per year    Active Member of [employer] or Organizations: Yes    Attends Banker Meetings: More than 4 times per year    Marital Status: Widowed  Intimate Partner Violence: Not At Risk (01/08/2023)   Humiliation, Afraid, Rape, and Kick questionnaire    Fear of Current or Ex-Partner: No    Emotionally Abused: No    Physically Abused: No    Sexually Abused: No    Outpatient Encounter Medications as of 05/16/2023  Medication Sig   acetaminophen (TYLENOL) 325 MG tablet Take 325 mg by mouth every 6 (six) hours as needed.   amoxicillin-clavulanate (AUGMENTIN) 875-125 MG tablet Take 1 tablet by mouth 2 (two) times daily for 10 days.   apixaban (ELIQUIS) 2.5 MG TABS tablet Take 1 tablet (2.5 mg total) by mouth 2 (two) times daily.   cholecalciferol (VITAMIN D3) 25 MCG (1000 UNIT) tablet Take 1,000 Units by mouth daily.   Cyanocobalamin (VITAMIN B-12 PO) Take 500 mcg by mouth. Take one tablet by mouth on Mon.,Wed., and Friday   digoxin (LANOXIN) 0.125 MG tablet Take 1 tablet (0.125 mg total) by mouth daily. (Patient taking differently: Take 0.125 mg by mouth every other  day.)   diltiazem (CARDIZEM) 30 MG tablet TAKE 1 TABLET BY MOUTH 2 TIMES DAILY.   famotidine (PEPCID) 20 MG tablet Take 1 tablet (20 mg total) by mouth 2 (two) times daily.   levothyroxine (SYNTHROID) 50 MCG tablet Take 1 tablet (50 mcg total) by mouth daily before breakfast.   Multiple Vitamins-Minerals (PRESERVISION AREDS 2 PO) Take 1 capsule by mouth 2 (two) times daily.   PARoxetine (PAXIL) 20 MG tablet Take 1 tablet (20 mg total) by mouth daily.   QUEtiapine (SEROQUEL) 50 MG tablet Take 1 tablet (50 mg total) by mouth at bedtime.   No facility-administered encounter medications on file as of 05/16/2023.    Allergies  Allergen Reactions   Nitrofurantoin Other (See Comments)    Liver failure   Nsaids Other (See Comments)    Kidney failure   Other Other (See Comments)    Most antibiotics require supervision per patient   Beta Adrenergic Blockers Other (See Comments)   Flonase [Fluticasone Propionate] Other (See Comments)    headache   Prevnar 13 [Pneumococcal 13-Val Conj Vacc] Other (See Comments)    Nerve damage in left arm   Propranolol Hcl Other (See Comments)    Unknown   Hydrocodone Other (See Comments)   Alendronate Sodium Other (See Comments)   Ciprofloxacin Other (See Comments)    Patient denies allergy--"I take Cipro for bladder infections"   Codeine Other (See Comments)   Crestor [Rosuvastatin Calcium] Other (See Comments)   Darvon Other (See Comments)   Lipitor [Atorvastatin Calcium] Other (See Comments)   Nortriptyline Hcl Other (See Comments)    States she felt like worms where crawling under her skin.   Potassium-Containing Compounds     Rash and raw areas / sore breast    Pravachol Other (See Comments)   Sulfa Antibiotics Other (See Comments)   Sulfamethoxazole Other (See Comments)    Unknown   Ultram [Tramadol Hcl] Itching   Welchol [Colesevelam Hcl] Other (See Comments)   Zetia [Ezetimibe] Other (See Comments)   Zocor [Simvastatin] Other (See Comments)     Review of Systems  Constitutional:  Negative for activity change, appetite change, chills, diaphoresis, fatigue, fever and unexpected weight change.  HENT:  Positive for hearing loss.   Genitourinary:  Positive for dysuria, frequency and urgency.  Psychiatric/Behavioral:  Positive for confusion and hallucinations.   All other systems reviewed and are negative.       Objective:  BP (!) 138/92   Pulse 99   Temp 97.9 F (36.6 C) (Temporal)   Ht 5\' 4"  (1.626 m)   Wt 125 lb (56.7 kg)   LMP 03/21/1971   SpO2 95%   BMI 21.46 kg/m    Wt Readings from Last 3 Encounters:  05/16/23 125 lb (56.7 kg)  04/18/23 123 lb (55.8 kg)  04/10/23 125 lb (56.7 kg)    Physical Exam Vitals and nursing note reviewed.  Constitutional:      General: She is not in acute distress.    Appearance: Normal appearance. She is not ill-appearing, toxic-appearing or diaphoretic.  HENT:     Head: Normocephalic and atraumatic.     Right Ear: Decreased hearing noted.     Left Ear: Decreased hearing noted.     Mouth/Throat:     Mouth: Mucous membranes are moist.  Eyes:     Pupils: Pupils are equal, round, and reactive to light.  Cardiovascular:     Rate and Rhythm: Normal rate. Rhythm irregularly irregular.  Abdominal:     General: Bowel sounds are normal.     Palpations: Abdomen is soft.     Tenderness: There is no abdominal tenderness. There is no right CVA tenderness or left CVA tenderness.  Musculoskeletal:     Cervical back: Normal range of motion and neck supple.     Right lower leg: No edema.     Left lower leg: No edema.  Skin:    General: Skin is warm and dry.     Capillary Refill: Capillary refill takes less than 2 seconds.  Neurological:     Mental Status: She is alert. Mental status is at baseline.  Psychiatric:        Mood and  Affect: Mood normal.        Behavior: Behavior normal.     Results for orders placed or performed in visit on 04/10/23  Urine Culture   Specimen: Urine    UR  Result Value Ref Range   Urine Culture, Routine Final report (A)    Organism ID, Bacteria Escherichia coli (A)    Antimicrobial Susceptibility Comment   Urinalysis  Result Value Ref Range   Specific Gravity, UA 1.015 1.005 - 1.030   pH, UA 6.5 5.0 - 7.5   Color, UA Yellow Yellow   Appearance Ur Cloudy (A) Clear   Leukocytes,UA 3+ (A) Negative   Protein,UA Trace (A) Negative/Trace   Glucose, UA Negative Negative   Ketones, UA Trace (A) Negative   RBC, UA Trace (A) Negative   Bilirubin, UA Negative Negative   Urobilinogen, Ur 0.2 0.2 - 1.0 mg/dL   Nitrite, UA Negative Negative       Pertinent labs & imaging results that were available during my care of the patient were reviewed by me and considered in my medical decision making.  Assessment & Plan:  Emily Wagner was seen today for hallucinations.  Diagnoses and all orders for this visit:  Dysuria -     Urinalysis, Routine w reflex microscopic -     Urine Culture  Acute cystitis without hematuria Multiple allergies. Will initiate below based on last culture. Culture pending, will change therapy if warranted.  -     amoxicillin-clavulanate (AUGMENTIN) 875-125 MG tablet; Take 1 tablet by mouth 2 (two) times daily for 10 days.     Continue all other maintenance medications.  Follow up plan: Return if symptoms worsen or fail to improve.   Continue healthy lifestyle choices, including diet (rich in fruits, vegetables, and lean proteins, and low in salt and simple carbohydrates) and exercise (at least 30 minutes of moderate physical activity daily).   The above assessment and management plan was discussed with the patient. The patient verbalized understanding of and has agreed to the management plan. Patient is aware to call the clinic if they develop any new symptoms or if symptoms persist or worsen. Patient is aware when to return to the clinic for a follow-up visit. Patient educated on when it is appropriate to go to the  emergency department.   Kari Baars, FNP-C [facility] [phone]

## 2023-05-18 LAB — URINE CULTURE

## 2023-06-08 ENCOUNTER — Encounter (HOSPITAL_COMMUNITY): Payer: Self-pay

## 2023-06-08 ENCOUNTER — Emergency Department (HOSPITAL_COMMUNITY): Payer: Medicare Other

## 2023-06-08 ENCOUNTER — Emergency Department (HOSPITAL_COMMUNITY)
Admission: EM | Admit: 2023-06-08 | Discharge: 2023-06-08 | Disposition: A | Discharge status: Home / Self Care | Payer: Medicare Other | Attending: Emergency Medicine | Admitting: Emergency Medicine

## 2023-06-08 ENCOUNTER — Other Ambulatory Visit: Payer: Self-pay

## 2023-06-08 DIAGNOSIS — S0990XA Unspecified injury of head, initial encounter: Secondary | ICD-10-CM | POA: Diagnosis not present

## 2023-06-08 DIAGNOSIS — S0083XA Contusion of other part of head, initial encounter: Secondary | ICD-10-CM

## 2023-06-08 DIAGNOSIS — I63521 Cerebral infarction due to unspecified occlusion or stenosis of right anterior cerebral artery: Secondary | ICD-10-CM | POA: Diagnosis not present

## 2023-06-08 DIAGNOSIS — W010XXA Fall on same level from slipping, tripping and stumbling without subsequent striking against object, initial encounter: Secondary | ICD-10-CM | POA: Diagnosis not present

## 2023-06-08 DIAGNOSIS — G9389 Other specified disorders of brain: Secondary | ICD-10-CM | POA: Diagnosis not present

## 2023-06-08 DIAGNOSIS — W19XXXA Unspecified fall, initial encounter: Secondary | ICD-10-CM

## 2023-06-08 DIAGNOSIS — S0181XA Laceration without foreign body of other part of head, initial encounter: Secondary | ICD-10-CM | POA: Insufficient documentation

## 2023-06-08 DIAGNOSIS — Z7901 Long term (current) use of anticoagulants: Secondary | ICD-10-CM | POA: Diagnosis not present

## 2023-06-08 DIAGNOSIS — I10 Essential (primary) hypertension: Secondary | ICD-10-CM | POA: Diagnosis not present

## 2023-06-08 DIAGNOSIS — M25569 Pain in unspecified knee: Secondary | ICD-10-CM | POA: Diagnosis not present

## 2023-06-08 DIAGNOSIS — S199XXA Unspecified injury of neck, initial encounter: Secondary | ICD-10-CM | POA: Diagnosis not present

## 2023-06-08 MED ORDER — LIDOCAINE-EPINEPHRINE-TETRACAINE (LET) TOPICAL GEL
3.0000 mL | Freq: Once | TOPICAL | Status: AC
  Administered 2023-06-08: 3 mL via TOPICAL
  Filled 2023-06-08: qty 3

## 2023-06-08 NOTE — ED Provider Notes (Signed)
[place] EMERGENCY DEPARTMENT AT [facility] Provider Note   CSN: [id number] Arrival date & time: 06/08/23  1507     History  No chief complaint on file.   Emily Wagner is a 89 y.o. female.  She is brought in by ambulance after a fall.  She said she was in the carport and tripped falling and striking her head.  She denies loss of consciousness.  EMS placed her in a cervical collar due to mechanism.  She is complaining some pain into her forehead.  She has not ambulated since the fall.  She is on blood thinners for A-fib.  No numbness or weakness.  No other injuries or complaints.  The history is provided by the patient and the EMS personnel.  Fall This is a new problem. The problem has not changed since onset.Associated symptoms include headaches. Pertinent negatives include no chest pain, no abdominal pain and no shortness of breath. Nothing aggravates the symptoms. Nothing relieves the symptoms. She has tried nothing for the symptoms. The treatment provided no relief.       Home Medications Prior to Admission medications   Medication Sig Start Date End Date Taking? Authorizing Provider  acetaminophen (TYLENOL) 325 MG tablet Take 325 mg by mouth every 6 (six) hours as needed.    [provider]  apixaban (ELIQUIS) 2.5 MG TABS tablet Take 1 tablet (2.5 mg total) by mouth 2 (two) times daily. 10/13/22   [name], MD  cholecalciferol (VITAMIN D3) 25 MCG (1000 UNIT) tablet Take 1,000 Units by mouth daily.    [provider]  Cyanocobalamin (VITAMIN B-12 PO) Take 500 mcg by mouth. Take one tablet by mouth on Mon.,Wed., and Friday    [provider]  digoxin (LANOXIN) 0.125 MG tablet Take 1 tablet (0.125 mg total) by mouth daily. Patient taking differently: Take 0.125 mg by mouth every other day. 05/24/22   [name], MD  diltiazem (CARDIZEM) 30 MG tablet TAKE 1 TABLET BY MOUTH 2 TIMES DAILY. 11/21/22   [name], MD  famotidine  (PEPCID) 20 MG tablet Take 1 tablet (20 mg total) by mouth 2 (two) times daily. 01/01/23   [name], Elige Radon, MD  levothyroxine (SYNTHROID) 50 MCG tablet Take 1 tablet (50 mcg total) by mouth daily before breakfast. 01/01/23   [name], Elige Radon, MD  Multiple Vitamins-Minerals (PRESERVISION AREDS 2 PO) Take 1 capsule by mouth 2 (two) times daily.    [provider]  PARoxetine (PAXIL) 20 MG tablet Take 1 tablet (20 mg total) by mouth daily. 01/01/23   [name], Elige Radon, MD  QUEtiapine (SEROQUEL) 50 MG tablet Take 1 tablet (50 mg total) by mouth at bedtime. 01/01/23   [name], Elige Radon, MD      Allergies    Nitrofurantoin, Nsaids, Other, Beta adrenergic blockers, Flonase [fluticasone propionate], Prevnar 13 [pneumococcal 13-val conj vacc], Propranolol hcl, Hydrocodone, Alendronate sodium, Ciprofloxacin, Codeine, Crestor [rosuvastatin calcium], Darvon, Lipitor [atorvastatin calcium], Nortriptyline hcl, Potassium-containing compounds, Pravachol, Sulfa antibiotics, Sulfamethoxazole, Ultram [tramadol hcl], Welchol [colesevelam hcl], Zetia [ezetimibe], and Zocor [simvastatin]    Review of Systems   Review of Systems  Constitutional:  Negative for fever.  Eyes:  Negative for visual disturbance.  Respiratory:  Negative for shortness of breath.   Cardiovascular:  Negative for chest pain.  Gastrointestinal:  Negative for abdominal pain.  Skin:  Positive for wound.  Neurological:  Positive for headaches.    Physical Exam Updated Vital Signs BP (!) 149/82   Pulse  89   Temp 98.8 F (37.1 C) (Oral)   Resp 16   Ht 5\' 4"  (1.626 m)   Wt 55.8 kg   LMP 03/21/1971   SpO2 97%   BMI 21.11 kg/m  Physical Exam Vitals and nursing note reviewed.  Constitutional:      General: She is not in acute distress.    Appearance: Normal appearance. She is well-developed.  HENT:     Head: Normocephalic.     Comments: She has a large forehead contusion with bruising and a small abrasion. Eyes:      Conjunctiva/sclera: Conjunctivae normal.  Cardiovascular:     Rate and Rhythm: Normal rate and regular rhythm.     Heart sounds: No murmur heard. Pulmonary:     Effort: Pulmonary effort is normal. No respiratory distress.     Breath sounds: Normal breath sounds.  Abdominal:     Palpations: Abdomen is soft.     Tenderness: There is no abdominal tenderness.  Musculoskeletal:        General: No tenderness or deformity. Normal range of motion.     Cervical back: Neck supple.  Skin:    General: Skin is warm and dry.     Capillary Refill: Capillary refill takes less than 2 seconds.  Neurological:     General: No focal deficit present.     Mental Status: She is alert.     Sensory: No sensory deficit.     Motor: No weakness.     ED Results / Procedures / Treatments   Labs (all labs ordered are listed, but only abnormal results are displayed) Labs Reviewed - No data to display  EKG EKG Interpretation Date/Time:  Friday June 08 2023 15:19:42 EDT Ventricular Rate:  97 PR Interval:    QRS Duration:  76 QT Interval:  353 QTC Calculation: 449 R Axis:   13  Text Interpretation: Atrial fibrillation Probable anteroseptal infarct, old LBBB seen on prior 11/23 resolved Confirmed by Meridee Score [phone]) on 06/08/2023 3:30:24 PM  Radiology CT Head Wo Contrast  Result Date: 06/08/2023 CLINICAL DATA:  Head trauma, minor (Age >= 65y); Neck trauma (Age >= 65y) EXAM: CT HEAD WITHOUT CONTRAST CT CERVICAL SPINE WITHOUT CONTRAST TECHNIQUE: Multidetector CT imaging of the head and cervical spine was performed following the standard protocol without intravenous contrast. Multiplanar CT image reconstructions of the cervical spine were also generated. RADIATION DOSE REDUCTION: This exam was performed according to the departmental dose-optimization program which includes automated exposure control, adjustment of the mA and/or kV according to patient size and/or use of iterative reconstruction technique.  COMPARISON:  None Available. FINDINGS: CT HEAD FINDINGS Brain: No evidence of acute infarction, hemorrhage, hydrocephalus, extra-axial collection or mass lesion/mass effect. Right temporal encephalomalacia with additional smaller infarcts in the right frontal lobe. Vascular: No hyperdense vessel. Skull: No acute fracture.  Forehead contusion. Sinuses/Orbits: Paranasal sinus mucosal thickening. No acute findings. Other: No mastoid effusions. CT CERVICAL SPINE FINDINGS Alignment: No substantial sagittal subluxation. Skull base and vertebrae: No acute fracture.  Osteopenia. Soft tissues and spinal canal: No prevertebral fluid or swelling. No visible canal hematoma. Disc levels:  Mild multilevel bony degenerative change. Upper chest: Visualized lung apices are clear. IMPRESSION: No evidence of acute abnormality intracranially or in the cervical spine. Electronically Signed   By: Feliberto Harts M.D.   On: 06/08/2023 16:05   CT Cervical Spine Wo Contrast  Result Date: 06/08/2023 CLINICAL DATA:  Head trauma, minor (Age >= 65y); Neck trauma (Age >= 65y) EXAM:  CT HEAD WITHOUT CONTRAST CT CERVICAL SPINE WITHOUT CONTRAST TECHNIQUE: Multidetector CT imaging of the head and cervical spine was performed following the standard protocol without intravenous contrast. Multiplanar CT image reconstructions of the cervical spine were also generated. RADIATION DOSE REDUCTION: This exam was performed according to the departmental dose-optimization program which includes automated exposure control, adjustment of the mA and/or kV according to patient size and/or use of iterative reconstruction technique. COMPARISON:  None Available. FINDINGS: CT HEAD FINDINGS Brain: No evidence of acute infarction, hemorrhage, hydrocephalus, extra-axial collection or mass lesion/mass effect. Right temporal encephalomalacia with additional smaller infarcts in the right frontal lobe. Vascular: No hyperdense vessel. Skull: No acute fracture.  Forehead  contusion. Sinuses/Orbits: Paranasal sinus mucosal thickening. No acute findings. Other: No mastoid effusions. CT CERVICAL SPINE FINDINGS Alignment: No substantial sagittal subluxation. Skull base and vertebrae: No acute fracture.  Osteopenia. Soft tissues and spinal canal: No prevertebral fluid or swelling. No visible canal hematoma. Disc levels:  Mild multilevel bony degenerative change. Upper chest: Visualized lung apices are clear. IMPRESSION: No evidence of acute abnormality intracranially or in the cervical spine. Electronically Signed   By: Feliberto Harts M.D.   On: 06/08/2023 16:05    Procedures .Marland KitchenLaceration Repair  Date/Time: 06/08/2023 4:49 PM  Performed by: Terrilee Files, MD Authorized by: Terrilee Files, MD   Consent:    Consent obtained:  Verbal   Consent given by:  Patient   Risks, benefits, and alternatives were discussed: yes     Risks discussed:  Infection, nerve damage, poor wound healing, pain, retained foreign body, tendon damage and vascular damage   Alternatives discussed:  No treatment, delayed treatment and referral Universal protocol:    Procedure explained and questions answered to patient or proxy's satisfaction: yes     Patient identity confirmed:  Verbally with patient Anesthesia:    Anesthesia method:  Topical application   Topical anesthetic:  LET Laceration details:    Location:  Face   Face location:  Forehead   Length (cm):  2 Pre-procedure details:    Preparation:  Patient was prepped and draped in usual sterile fashion Treatment:    Area cleansed with:  Saline   Amount of cleaning:  Standard   Irrigation solution:  Sterile saline Skin repair:    Repair method:  Tissue adhesive Repair type:    Repair type:  Simple Post-procedure details:    Dressing:  Open (no dressing)   Procedure completion:  Tolerated well, no immediate complications     Medications Ordered in ED Medications  lidocaine-EPINEPHrine-tetracaine (LET) topical gel  (3 mLs Topical Given 06/08/23 1632)    ED Course/ Medical Decision Making/ A&P Clinical Course as of 06/08/23 2118  Fri Jun 08, 2023  1609 CT of head and cervical spine do not show any acute findings.  Awaiting radiology reading. [MB]  1626 We cleaned up her wounds with some saline.  She has 3 small abrasions versus lacerations related to her hairline.  Will try some blood and Dermabond does not think they are deep enough to require sutures. [MB]  1737 Patient's daughter is here now.  I reviewed results of workup with her and she is comfortable taking her home.  Return instructions discussed [MB]    Clinical Course User Index [MB] Terrilee Files, MD                             Medical Decision Making Amount and/or Complexity  of Data Reviewed Radiology: ordered.   This patient complains of fall head injury; this involves an extensive number of treatment Options and is a complaint that carries with it a high risk of complications and morbidity. The differential includes contusion, hematoma, fracture, bleed  I ordered medication topical let for wound and reviewed PMP when indicated. I ordered imaging studies which included CT head and cervical spine and I independently    visualized and interpreted imaging which showed no intracranial bleeding no fracture Additional history obtained from patient's daughter Previous records obtained and reviewed in epic no recent admissions Cardiac monitoring reviewed, atrial fibrillation Social determinants considered, patient is physically inactive Critical Interventions: None  After the interventions stated above, I reevaluated the patient and found patient to be awake alert no distress Admission and further testing considered, no indications for admission or further workup at this time.  Recommended close follow-up with PCP and head injury instructions given.  Return instructions discussed         Final Clinical Impression(s) / ED  Diagnoses Final diagnoses:  Minor head injury without loss of consciousness, initial encounter  Fall, initial encounter  Contusion of face, initial encounter  Laceration of forehead, initial encounter    Rx / DC Orders ED Discharge Orders     None         Terrilee Files, MD 06/08/23 2119

## 2023-06-08 NOTE — ED Triage Notes (Signed)
Pt reports she missed a little step up in her carport and fell on the concrete.  Pt denies LOC and has large hematoma to forehead with abrasion.

## 2023-06-08 NOTE — Discharge Instructions (Signed)
You are seen in the emergency department for evaluation of injuries from a fall.  You had a CAT scan of your head and your neck that did not show any acute traumatic findings.  Your abrasions were cleaned and some skin glue was applied.  Please use ice to the affected area.  Tylenol for pain.  Follow-up with your regular doctor.  Return to the emergency department if any worsening or concerning symptoms

## 2023-06-14 ENCOUNTER — Telehealth: Payer: Self-pay

## 2023-06-14 NOTE — Telephone Encounter (Signed)
Transition Care Management Unsuccessful Follow-up Telephone Call  Date of discharge and from where:  [facility]  Attempts:  1st Attempt  Reason for unsuccessful TCM follow-up call:  No answer/busy   Lenard Forth [facility] Guide, [facility] [phone] [address] [place], [place], [place] [address] Phone: [phone] Email: [email or web address]

## 2023-07-04 ENCOUNTER — Encounter: Payer: Self-pay | Admitting: Family Medicine

## 2023-07-04 ENCOUNTER — Ambulatory Visit (INDEPENDENT_AMBULATORY_CARE_PROVIDER_SITE_OTHER): Payer: Medicare Other | Admitting: Family Medicine

## 2023-07-04 VITALS — BP 132/80 | HR 88 | Ht 64.0 in | Wt 124.0 lb

## 2023-07-04 DIAGNOSIS — I10 Essential (primary) hypertension: Secondary | ICD-10-CM

## 2023-07-04 DIAGNOSIS — E039 Hypothyroidism, unspecified: Secondary | ICD-10-CM | POA: Diagnosis not present

## 2023-07-04 DIAGNOSIS — I4819 Other persistent atrial fibrillation: Secondary | ICD-10-CM | POA: Diagnosis not present

## 2023-07-04 DIAGNOSIS — E7800 Pure hypercholesterolemia, unspecified: Secondary | ICD-10-CM | POA: Diagnosis not present

## 2023-07-04 DIAGNOSIS — R8290 Unspecified abnormal findings in urine: Secondary | ICD-10-CM

## 2023-07-04 DIAGNOSIS — F4323 Adjustment disorder with mixed anxiety and depressed mood: Secondary | ICD-10-CM | POA: Diagnosis not present

## 2023-07-04 LAB — URINALYSIS, COMPLETE
Bilirubin, UA: NEGATIVE
Glucose, UA: NEGATIVE
Ketones, UA: NEGATIVE
Nitrite, UA: NEGATIVE
Specific Gravity, UA: 1.02 (ref 1.005–1.030)
Urobilinogen, Ur: 0.2 mg/dL (ref 0.2–1.0)
pH, UA: 6 (ref 5.0–7.5)

## 2023-07-04 LAB — CMP14+EGFR

## 2023-07-04 LAB — CBC WITH DIFFERENTIAL/PLATELET
Basophils Absolute: 0.1 10*3/uL (ref 0.0–0.2)
Basos: 1 %
EOS (ABSOLUTE): 0.2 10*3/uL (ref 0.0–0.4)
Eos: 2 %
Hematocrit: 38.3 % (ref 34.0–46.6)
Hemoglobin: 11.9 g/dL (ref 11.1–15.9)
Immature Grans (Abs): 0 10*3/uL (ref 0.0–0.1)
Immature Granulocytes: 0 %
Lymphocytes Absolute: 1.3 10*3/uL (ref 0.7–3.1)
Lymphs: 16 %
MCH: 27 pg (ref 26.6–33.0)
MCHC: 31.1 g/dL — ABNORMAL LOW (ref 31.5–35.7)
MCV: 87 fL (ref 79–97)
Monocytes Absolute: 0.7 10*3/uL (ref 0.1–0.9)
Monocytes: 9 %
Neutrophils Absolute: 5.8 10*3/uL (ref 1.4–7.0)
Neutrophils: 72 %
Platelets: 300 10*3/uL (ref 150–450)
RBC: 4.4 x10E6/uL (ref 3.77–5.28)
RDW: 14.7 % (ref 11.7–15.4)
WBC: 8 10*3/uL (ref 3.4–10.8)

## 2023-07-04 LAB — MICROSCOPIC EXAMINATION
Renal Epithel, UA: NONE SEEN /hpf
Yeast, UA: NONE SEEN

## 2023-07-04 LAB — LIPID PANEL

## 2023-07-04 LAB — TSH

## 2023-07-04 LAB — VITAMIN D 25 HYDROXY (VIT D DEFICIENCY, FRACTURES)

## 2023-07-04 MED ORDER — LEVOTHYROXINE SODIUM 50 MCG PO TABS
50.0000 ug | ORAL_TABLET | Freq: Every day | ORAL | 1 refills | Status: DC
Start: 2023-07-04 — End: 2023-09-03

## 2023-07-04 NOTE — Progress Notes (Signed)
BP 132/80   Pulse 88   Ht 5\' 4"  (1.626 m)   Wt 124 lb (56.2 kg)   LMP 03/21/1971   SpO2 96%   BMI 21.28 kg/m    Subjective:   Patient ID: Emily Wagner, female    DOB: 23-Mar-1933, 89 y.o.   MRN: [id number]  HPI: Emily Wagner is a 89 y.o. female presenting on 07/04/2023 for Medical Management of Chronic Issues, Hyperlipidemia, and Hypertension   HPI Hypertension and A-fib, sees cardiology Patient is currently on Eliquis and digoxin and diltiazem, and their blood pressure today is 132/80. Patient denies any lightheadedness or dizziness. Patient denies headaches, blurred vision, chest pains, shortness of breath, or weakness. Denies any side effects from medication and is content with current medication.   Hyperlipidemia and CAD and atherosclerotic cardiovascular disease, sees cardiology Patient is coming in for recheck of his hyperlipidemia. The patient is currently taking no cholesterol medication because she has been intolerant. They deny any issues with myalgias or history of liver damage from it. They deny any focal numbness or weakness or chest pain.   Hypothyroidism recheck Patient is coming in for thyroid recheck today as well. They deny any issues with hair changes or heat or cold problems or diarrhea or constipation. They deny any chest pain or palpitations. They are currently on levothyroxine 50 micrograms   Anxiety recheck Patient is coming in today for anxiety recheck.  She currently takes the Paxil.  Seems to be doing okay.  Denies any major issues.  Denies any suicidal ideations    07/04/2023   10:08 AM 05/16/2023    1:58 PM 03/22/2023    2:47 PM 01/08/2023    9:32 AM 01/01/2023    2:12 PM  Depression screen PHQ 2/9  Decreased Interest 0 0 0 1   Down, Depressed, Hopeless 0 0 0 0   PHQ - 2 Score 0 0 0 1   Altered sleeping 0 0 0 3 3  Tired, decreased energy 3 3 2 3 3   Change in appetite 1 1 1 1 1   Feeling bad or failure about yourself  0 1 0 0 0  Trouble  concentrating 0 1 0 1 2  Moving slowly or fidgety/restless 1 1 0 0 0  Suicidal thoughts 0 0 0 0 0  PHQ-9 Score 5 7 3 9    Difficult doing work/chores Somewhat difficult Somewhat difficult Somewhat difficult Somewhat difficult Somewhat difficult     Relevant past medical, surgical, family and social history reviewed and updated as indicated. Interim medical history since our last visit reviewed. Allergies and medications reviewed and updated.  Review of Systems  Constitutional:  Positive for fatigue. Negative for chills and fever.  Eyes:  Negative for redness and visual disturbance.  Respiratory:  Negative for chest tightness and shortness of breath.   Cardiovascular:  Negative for chest pain and leg swelling.  Genitourinary:  Negative for difficulty urinating and dysuria.  Musculoskeletal:  Positive for arthralgias, back pain and gait problem (Uses walker).  Skin:  Negative for rash.  Neurological:  Negative for dizziness, light-headedness and headaches.  Psychiatric/Behavioral:  Positive for dysphoric mood. Negative for agitation, behavioral problems, self-injury, sleep disturbance and suicidal ideas. The patient is nervous/anxious.   All other systems reviewed and are negative.   Per HPI unless specifically indicated above   Allergies as of 07/04/2023       Reactions   Nitrofurantoin Other (See Comments)   Liver failure   Nsaids  Other (See Comments)   Kidney failure   Other Other (See Comments)   Most antibiotics require supervision per patient   Beta Adrenergic Blockers Other (See Comments)   Flonase [fluticasone Propionate] Other (See Comments)   headache   Prevnar 13 [pneumococcal 13-val Conj Vacc] Other (See Comments)   Nerve damage in left arm   Propranolol Hcl Other (See Comments)   Unknown   Hydrocodone Other (See Comments)   Alendronate Sodium Other (See Comments)   Ciprofloxacin Other (See Comments)   Patient denies allergy--"I take Cipro for bladder infections"    Codeine Other (See Comments)   Crestor [rosuvastatin Calcium] Other (See Comments)   Darvon Other (See Comments)   Lipitor [atorvastatin Calcium] Other (See Comments)   Nortriptyline Hcl Other (See Comments)   States she felt like worms where crawling under her skin.   Potassium-containing Compounds    Rash and raw areas / sore breast    Pravachol Other (See Comments)   Sulfa Antibiotics Other (See Comments)   Sulfamethoxazole Other (See Comments)   Unknown   Ultram [tramadol Hcl] Itching   Welchol [colesevelam Hcl] Other (See Comments)   Zetia [ezetimibe] Other (See Comments)   Zocor [simvastatin] Other (See Comments)        Medication List        Accurate as of July 04, 2023 10:24 AM. If you have any questions, ask your nurse or doctor.          STOP taking these medications    QUEtiapine 50 MG tablet Commonly known as: SEROQUEL Stopped by: Elige Radon        TAKE these medications    acetaminophen 325 MG tablet Commonly known as: TYLENOL Take 325 mg by mouth every 6 (six) hours as needed.   apixaban 2.5 MG Tabs tablet Commonly known as: ELIQUIS Take 1 tablet (2.5 mg total) by mouth 2 (two) times daily.   cholecalciferol 25 MCG (1000 UNIT) tablet Commonly known as: VITAMIN D3 Take 1,000 Units by mouth daily.   digoxin 0.125 MG tablet Commonly known as: LANOXIN Take 1 tablet (0.125 mg total) by mouth daily. What changed: when to take this   diltiazem 30 MG tablet Commonly known as: CARDIZEM TAKE 1 TABLET BY MOUTH 2 TIMES DAILY.   famotidine 20 MG tablet Commonly known as: PEPCID Take 1 tablet (20 mg total) by mouth 2 (two) times daily.   levothyroxine 50 MCG tablet Commonly known as: SYNTHROID Take 1 tablet (50 mcg total) by mouth daily before breakfast.   PARoxetine 20 MG tablet Commonly known as: PAXIL Take 1 tablet (20 mg total) by mouth daily.   PRESERVISION AREDS 2 PO Take 1 capsule by mouth 2 (two) times daily.   VITAMIN  B-12 PO Take 500 mcg by mouth. Take one tablet by mouth on Mon.,Wed., and Friday         Objective:   BP 132/80   Pulse 88   Ht 5\' 4"  (1.626 m)   Wt 124 lb (56.2 kg)   LMP 03/21/1971   SpO2 96%   BMI 21.28 kg/m   Wt Readings from Last 3 Encounters:  07/04/23 124 lb (56.2 kg)  06/08/23 123 lb (55.8 kg)  05/16/23 125 lb (56.7 kg)    Physical Exam Vitals and nursing note reviewed.  Constitutional:      General: She is not in acute distress.    Appearance: She is well-developed. She is not diaphoretic.  Eyes:     Conjunctiva/sclera: Conjunctivae normal.  Cardiovascular:     Rate and Rhythm: Normal rate. Rhythm irregular.     Heart sounds: Normal heart sounds. No murmur heard. Pulmonary:     Effort: Pulmonary effort is normal. No respiratory distress.     Breath sounds: Normal breath sounds. No wheezing.  Musculoskeletal:        General: No swelling or tenderness. Normal range of motion.  Skin:    General: Skin is warm and dry.     Findings: No rash.  Neurological:     Mental Status: She is alert and oriented to person, place, and time.     Coordination: Coordination normal.  Psychiatric:        Mood and Affect: Mood is anxious and depressed.        Behavior: Behavior normal.        Thought Content: Thought content does not include suicidal ideation. Thought content does not include suicidal plan.       Assessment & Plan:   Problem List Items Addressed This Visit       Cardiovascular and Mediastinum   Essential hypertension   Relevant Medications   levothyroxine (SYNTHROID) 50 MCG tablet   Other Relevant Orders   CBC with Differential/Platelet   CMP14+EGFR   VITAMIN D 25 Hydroxy (Vit-D Deficiency, Fractures)   Atrial fibrillation (HCC)   Relevant Orders   CBC with Differential/Platelet   TSH   VITAMIN D 25 Hydroxy (Vit-D Deficiency, Fractures)     Endocrine   Acquired hypothyroidism   Relevant Medications   levothyroxine (SYNTHROID) 50 MCG tablet    Other Relevant Orders   TSH   VITAMIN D 25 Hydroxy (Vit-D Deficiency, Fractures)     Other   HYPERCHOLESTEROLEMIA - Primary   Relevant Orders   CBC with Differential/Platelet   Lipid panel   TSH   VITAMIN D 25 Hydroxy (Vit-D Deficiency, Fractures)   Adjustment disorder with mixed anxiety and depressed mood   Relevant Orders   CBC with Differential/Platelet   VITAMIN D 25 Hydroxy (Vit-D Deficiency, Fractures)    Continue current medicine, no changes.  Will check blood work today.   Follow up plan: Return in about 6 months (around 01/04/2024), or if symptoms worsen or fail to improve, for Hypertension hypothyroidism and hyperlipidemia.  Counseling provided for all of the vaccine components Orders Placed This Encounter  Procedures   CBC with Differential/Platelet   CMP14+EGFR   Lipid panel   TSH   VITAMIN D 25 Hydroxy (Vit-D Deficiency, Fractures)    Arville Care, MD [facility] 07/04/2023, 10:24 AM

## 2023-07-04 NOTE — Addendum Note (Signed)
Addended by: Dorene Sorrow on: 07/04/2023 10:34 AM   Modules accepted: Orders

## 2023-07-12 ENCOUNTER — Telehealth: Payer: Self-pay | Admitting: Family Medicine

## 2023-07-12 NOTE — Telephone Encounter (Signed)
Pt made aware that I cannot get her in to see [name] until mid September. Pt states that she will call the other office and see if she ca/n r/s. She wants to keep the appt with [name] on  for now. Pt will call back if needed.

## 2023-07-12 NOTE — Telephone Encounter (Signed)
H/O Polio  Legs just go out from under her. Keeps falling in and outside of her home.  ? Assisted Living. Doesn't want to but is scared about falling again.   Pt states that she now feels that it is not safe to drive other [place] [facility]. [place] road  Information packets on assisted living options left up front for daughter Aurther Loft to pick up.  Appt made for 8/21 at 10:55am

## 2023-07-12 NOTE — Telephone Encounter (Signed)
Pt says that she cannot come on 07/18/2023 because she realized she has another apt. I offered next available in Oct. Pt wants to talk to [employer].

## 2023-07-18 ENCOUNTER — Encounter: Payer: Self-pay | Admitting: Family Medicine

## 2023-07-18 ENCOUNTER — Ambulatory Visit: Payer: Medicare Other | Admitting: Family Medicine

## 2023-07-18 VITALS — BP 165/90 | HR 83 | Ht 64.0 in | Wt 124.0 lb

## 2023-07-18 DIAGNOSIS — R296 Repeated falls: Secondary | ICD-10-CM

## 2023-07-18 DIAGNOSIS — B91 Sequelae of poliomyelitis: Secondary | ICD-10-CM

## 2023-07-18 DIAGNOSIS — Z8673 Personal history of transient ischemic attack (TIA), and cerebral infarction without residual deficits: Secondary | ICD-10-CM

## 2023-07-18 DIAGNOSIS — G8194 Hemiplegia, unspecified affecting left nondominant side: Secondary | ICD-10-CM | POA: Diagnosis not present

## 2023-07-18 NOTE — Progress Notes (Signed)
BP (!) 165/90   Pulse 83   Ht 5\' 4"  (1.626 m)   Wt 124 lb (56.2 kg)   LMP 03/21/1971   SpO2 95%   BMI 21.28 kg/m    Subjective:   Patient ID: Emily Wagner, female    DOB: 06/26/1933, 89 y.o.   MRN: [id number]  HPI: Emily Wagner is a 89 y.o. female presenting on 07/18/2023 for Fall (Multiple. Would like to discuss assisted living.)   HPI Patient has a history of CVA with left-sided hemiparesis and affects her walking and also has postpolio syndrome which causes pain in the legs and her back and affects her ability to walk.  She does use a walker to move around but she has gotten to the point where she is falling frequently and she is concerned about her safety at home.  Her daughter is there with her most of the time but sometimes she has been stuck on the ground for long periods of time and she would like to look into an assisted living facility.  She has a history of a fall but because right hip fracture and has hemiarthroplasty of the hip as well.  She just feels like she is to the point where she is not safe at home and she wants to start looking into assisted living facilities.  Relevant past medical, surgical, family and social history reviewed and updated as indicated. Interim medical history since our last visit reviewed. Allergies and medications reviewed and updated.  Review of Systems  Constitutional:  Negative for chills and fever.  Eyes:  Negative for redness and visual disturbance.  Respiratory:  Negative for chest tightness and shortness of breath.   Cardiovascular:  Negative for chest pain and leg swelling.  Genitourinary:  Negative for difficulty urinating and dysuria.  Musculoskeletal:  Positive for arthralgias, back pain, gait problem and myalgias.  Skin:  Negative for rash.  Neurological:  Negative for light-headedness and headaches.  Psychiatric/Behavioral:  Negative for agitation, behavioral problems, confusion and decreased concentration.   All other  systems reviewed and are negative.   Per HPI unless specifically indicated above   Allergies as of 07/18/2023       Reactions   Nitrofurantoin Other (See Comments)   Liver failure   Nsaids Other (See Comments)   Kidney failure   Other Other (See Comments)   Most antibiotics require supervision per patient   Beta Adrenergic Blockers Other (See Comments)   Flonase [fluticasone Propionate] Other (See Comments)   headache   Prevnar 13 [pneumococcal 13-val Conj Vacc] Other (See Comments)   Nerve damage in left arm   Propranolol Hcl Other (See Comments)   Unknown   Hydrocodone Other (See Comments)   Alendronate Sodium Other (See Comments)   Ciprofloxacin Other (See Comments)   Patient denies allergy--"I take Cipro for bladder infections"   Codeine Other (See Comments)   Crestor [rosuvastatin Calcium] Other (See Comments)   Darvon Other (See Comments)   Lipitor [atorvastatin Calcium] Other (See Comments)   Nortriptyline Hcl Other (See Comments)   States she felt like worms where crawling under her skin.   Potassium-containing Compounds    Rash and raw areas / sore breast    Pravachol Other (See Comments)   Sulfa Antibiotics Other (See Comments)   Sulfamethoxazole Other (See Comments)   Unknown   Ultram [tramadol Hcl] Itching   Welchol [colesevelam Hcl] Other (See Comments)   Zetia [ezetimibe] Other (See Comments)   Zocor [simvastatin]  Other (See Comments)        Medication List        Accurate as of July 18, 2023 11:43 AM. If you have any questions, ask your nurse or doctor.          acetaminophen 325 MG tablet Commonly known as: TYLENOL Take 325 mg by mouth every 6 (six) hours as needed.   apixaban 2.5 MG Tabs tablet Commonly known as: ELIQUIS Take 1 tablet (2.5 mg total) by mouth 2 (two) times daily.   cholecalciferol 25 MCG (1000 UNIT) tablet Commonly known as: VITAMIN D3 Take 1,000 Units by mouth daily.   digoxin 0.125 MG tablet Commonly known as:  LANOXIN Take 1 tablet (0.125 mg total) by mouth daily. What changed: when to take this   diltiazem 30 MG tablet Commonly known as: CARDIZEM TAKE 1 TABLET BY MOUTH 2 TIMES DAILY.   famotidine 20 MG tablet Commonly known as: PEPCID Take 1 tablet (20 mg total) by mouth 2 (two) times daily.   levothyroxine 50 MCG tablet Commonly known as: SYNTHROID Take 1 tablet (50 mcg total) by mouth daily before breakfast.   PARoxetine 20 MG tablet Commonly known as: PAXIL Take 1 tablet (20 mg total) by mouth daily.   PRESERVISION AREDS 2 PO Take 1 capsule by mouth 2 (two) times daily.   VITAMIN B-12 PO Take 500 mcg by mouth. Take one tablet by mouth on Mon.,Wed., and Friday         Objective:   BP (!) 165/90   Pulse 83   Ht 5\' 4"  (1.626 m)   Wt 124 lb (56.2 kg)   LMP 03/21/1971   SpO2 95%   BMI 21.28 kg/m   Wt Readings from Last 3 Encounters:  07/18/23 124 lb (56.2 kg)  07/04/23 124 lb (56.2 kg)  06/08/23 123 lb (55.8 kg)    Physical Exam Vitals and nursing note reviewed.  Constitutional:      General: She is not in acute distress.    Appearance: Normal appearance. She is well-developed.     Comments: Uses walker, has kyphosis  Eyes:     Conjunctiva/sclera: Conjunctivae normal.  Skin:    General: Skin is warm and dry.     Findings: No rash.  Neurological:     Mental Status: She is alert and oriented to person, place, and time.     Coordination: Coordination normal.  Psychiatric:        Behavior: Behavior normal.        Cognition and Memory: Cognition is not impaired. Memory is not impaired. She does not exhibit impaired recent memory or impaired remote memory.       Assessment & Plan:   Problem List Items Addressed This Visit       Nervous and Auditory   POST-POLIO SYNDROME - Primary   Left hemiparesis (HCC)     Other   Hx of ischemic right MCA stroke   Other Visit Diagnoses     Recurrent falls           Patient wants to look into assisted living  facilities, will do an [place] 2 form for her.  She is thinking about [facility] but they want to call around first  We will do the FL 2 form and then send it once they let us know where he needs to go. Follow up plan: Return if symptoms worsen or fail to improve.  Counseling provided for all of the vaccine components No orders of the  defined types were placed in this encounter.   Arville Care, MD [facility] 07/18/2023, 11:43 AM

## 2023-08-01 ENCOUNTER — Other Ambulatory Visit: Payer: Self-pay | Admitting: Cardiology

## 2023-08-06 ENCOUNTER — Telehealth: Payer: Self-pay | Admitting: Family Medicine

## 2023-08-06 NOTE — Telephone Encounter (Signed)
Pts daughter says she believes pt has a UTI because she has been hallucinating but pt doesn't think she has one and is unwilling to come in to be seen to be checked. Daughter wants to know if PCP can order for her urine to be checked without an appt? Unable to do video visit.

## 2023-08-06 NOTE — Telephone Encounter (Signed)
Needs appointment, if she will not have an NCS then they can take him to the urgent care or take her to the emergency department if she continues to hallucinate, she may need to go to the emergency department anyways because if she is hallucinating she might be going septic

## 2023-08-06 NOTE — Telephone Encounter (Signed)
Left message making pt aware that she will need an appt. Either virtual or in office. Made aware that [name]'s schedule is full today but we can try to schedule with another provider is she wishes.

## 2023-08-07 ENCOUNTER — Ambulatory Visit (INDEPENDENT_AMBULATORY_CARE_PROVIDER_SITE_OTHER): Payer: Medicare Other | Admitting: Family

## 2023-08-07 ENCOUNTER — Encounter: Payer: Self-pay | Admitting: Family

## 2023-08-07 VITALS — BP 155/92 | HR 99 | Temp 97.5°F | Ht 64.0 in | Wt 124.0 lb

## 2023-08-07 DIAGNOSIS — R399 Unspecified symptoms and signs involving the genitourinary system: Secondary | ICD-10-CM | POA: Diagnosis not present

## 2023-08-07 DIAGNOSIS — R443 Hallucinations, unspecified: Secondary | ICD-10-CM

## 2023-08-07 LAB — URINALYSIS, COMPLETE
Bilirubin, UA: NEGATIVE
Glucose, UA: NEGATIVE
Ketones, UA: NEGATIVE
Nitrite, UA: NEGATIVE
Protein,UA: NEGATIVE
Specific Gravity, UA: 1.01 (ref 1.005–1.030)
Urobilinogen, Ur: 0.2 mg/dL (ref 0.2–1.0)
pH, UA: 5.5 (ref 5.0–7.5)

## 2023-08-07 LAB — MICROSCOPIC EXAMINATION
Renal Epithel, UA: NONE SEEN /HPF
Yeast, UA: NONE SEEN

## 2023-08-07 MED ORDER — CEPHALEXIN 500 MG PO CAPS
500.0000 mg | ORAL_CAPSULE | Freq: Two times a day (BID) | ORAL | 0 refills | Status: DC
Start: 2023-08-07 — End: 2023-11-01

## 2023-08-07 NOTE — Progress Notes (Signed)
   Subjective:    Patient ID: Emily Wagner, female    DOB: [birth year], 89 y.o.   MRN: [id number]  Chief Complaint  Patient presents with   Hallucinations    HPI Daughter brings patient in today with hallucinations. Pt states she is not seeing hallucinations, but is seeing bears, coyotes, and ducks across the road. Daughter is worried she might have a UTI. PT is very upset and says "nothing is wrong with me".   Denies any dysuria, fever, urinary frequency, cough, or SOB.    Review of Systems  All other systems reviewed and are negative.      Objective:   Physical Exam Vitals reviewed.  Constitutional:      General: She is not in acute distress.    Appearance: She is well-developed.  HENT:     Head: Normocephalic and atraumatic.  Eyes:     Pupils: Pupils are equal, round, and reactive to light.  Neck:     Thyroid: No thyromegaly.  Cardiovascular:     Rate and Rhythm: Normal rate and regular rhythm.     Heart sounds: Normal heart sounds. No murmur heard. Pulmonary:     Effort: Pulmonary effort is normal. No respiratory distress.     Breath sounds: Normal breath sounds. No wheezing.  Abdominal:     General: Bowel sounds are normal. There is no distension.     Palpations: Abdomen is soft.     Tenderness: There is no abdominal tenderness.  Musculoskeletal:        General: No tenderness. Normal range of motion.     Cervical back: Normal range of motion and neck supple.  Skin:    General: Skin is warm and dry.  Neurological:     Mental Status: She is alert and oriented to person, place, and time.     Cranial Nerves: No cranial nerve deficit.     Motor: Weakness present.     Gait: Gait abnormal (using rolling walker).     Deep Tendon Reflexes: Reflexes are normal and symmetric.  Psychiatric:        Behavior: Behavior normal.        Thought Content: Thought content normal.        Judgment: Judgment normal.     BP (!) 155/92   Pulse 99   Temp (!) 97.5 F (36.4  C) (Temporal)   Ht 5\' 4"  (1.626 m)   Wt 124 lb (56.2 kg)   LMP 03/21/1971   SpO2 97%   BMI 21.28 kg/m       Assessment & Plan:  Emily Wagner comes in today with chief complaint of Hallucinations   Diagnosis and orders addressed:  1. Hallucination - Urine Culture - Urinalysis, Complete  2. UTI symptoms Force fluids Start keflex  Culture pending, if negative may need to talk to PCP about the hallucination  - cephALEXin (KEFLEX) 500 MG capsule; Take 1 capsule (500 mg total) by mouth 2 (two) times daily.  Dispense: 14 capsule; Refill: 0   Jannifer Rodney, FNP

## 2023-08-07 NOTE — Patient Instructions (Signed)
Urinary Tract Infection, Adult  A urinary tract infection (UTI) is an infection of any part of the urinary tract. The urinary tract includes the kidneys, ureters, bladder, and urethra. These organs make, store, and get rid of urine in the body. An upper UTI affects the ureters and kidneys. A lower UTI affects the bladder and urethra. What are the causes? Most urinary tract infections are caused by bacteria in your genital area around your urethra, where urine leaves your body. These bacteria grow and cause inflammation of your urinary tract. What increases the risk? You are more likely to develop this condition if: You have a urinary catheter that stays in place. You are not able to control when you urinate or have a bowel movement (incontinence). You are female and you: Use a spermicide or diaphragm for birth control. Have low estrogen levels. Are pregnant. You have certain genes that increase your risk. You are sexually active. You take antibiotic medicines. You have a condition that causes your flow of urine to slow down, such as: An enlarged prostate, if you are female. Blockage in your urethra. A kidney stone. A nerve condition that affects your bladder control (neurogenic bladder). Not getting enough to drink, or not urinating often. You have certain medical conditions, such as: Diabetes. A weak disease-fighting system (immunesystem). Sickle cell disease. Gout. Spinal cord injury. What are the signs or symptoms? Symptoms of this condition include: Needing to urinate right away (urgency). Frequent urination. This may include small amounts of urine each time you urinate. Pain or burning with urination. Blood in the urine. Urine that smells bad or unusual. Trouble urinating. Cloudy urine. Vaginal discharge, if you are female. Pain in the abdomen or the lower back. You may also have: Vomiting or a decreased appetite. Confusion. Irritability or tiredness. A fever or  chills. Diarrhea. The first symptom in older adults may be confusion. In some cases, they may not have any symptoms until the infection has worsened. How is this diagnosed? This condition is diagnosed based on your medical history and a physical exam. You may also have other tests, including: Urine tests. Blood tests. Tests for STIs (sexually transmitted infections). If you have had more than one UTI, a cystoscopy or imaging studies may be done to determine the cause of the infections. How is this treated? Treatment for this condition includes: Antibiotic medicine. Over-the-counter medicines to treat discomfort. Drinking enough water to stay hydrated. If you have frequent infections or have other conditions such as a kidney stone, you may need to see a health care provider who specializes in the urinary tract (urologist). In rare cases, urinary tract infections can cause sepsis. Sepsis is a life-threatening condition that occurs when the body responds to an infection. Sepsis is treated in the hospital with IV antibiotics, fluids, and other medicines. Follow these instructions at home:  Medicines Take over-the-counter and prescription medicines only as told by your health care provider. If you were prescribed an antibiotic medicine, take it as told by your health care provider. Do not stop using the antibiotic even if you start to feel better. General instructions Make sure you: Empty your bladder often and completely. Do not hold urine for long periods of time. Empty your bladder after sex. Wipe from front to back after urinating or having a bowel movement if you are female. Use each tissue only one time when you wipe. Drink enough fluid to keep your urine pale yellow. Keep all follow-up visits. This is important. Contact a health   care provider if: Your symptoms do not get better after 1-2 days. Your symptoms go away and then return. Get help right away if: You have severe pain in  your back or your lower abdomen. You have a fever or chills. You have nausea or vomiting. Summary A urinary tract infection (UTI) is an infection of any part of the urinary tract, which includes the kidneys, ureters, bladder, and urethra. Most urinary tract infections are caused by bacteria in your genital area. Treatment for this condition often includes antibiotic medicines. If you were prescribed an antibiotic medicine, take it as told by your health care provider. Do not stop using the antibiotic even if you start to feel better. Keep all follow-up visits. This is important. This information is not intended to replace advice given to you by your health care provider. Make sure you discuss any questions you have with your health care provider. Document Revised: 06/20/2020 Document Reviewed: 06/25/2020 Elsevier Patient Education  2024 Elsevier Inc.  

## 2023-08-10 LAB — URINE CULTURE

## 2023-08-17 ENCOUNTER — Telehealth: Payer: Self-pay | Admitting: Family Medicine

## 2023-08-17 NOTE — Telephone Encounter (Signed)
Patient was seen on 9/10 and was treated for UTI with hallucinations. Daughter said she is still having hallucinations and has finished the keflex that was prescribed for UTI and wants to know what she needs to do going forward. Please call back and advise.

## 2023-08-17 NOTE — Telephone Encounter (Signed)
Patient's daughter calling back please call her at [phone], said she must have not had service on cell when called.

## 2023-08-21 NOTE — Telephone Encounter (Signed)
Left message for daughter stating to make an appt for pt.

## 2023-08-21 NOTE — Telephone Encounter (Signed)
Appt made for 9/26 at 11:05.  Pt is seeing a man in her basement. Believes daughter is bringing him in the home.  Grandson has placed cameras and even discussed blocking the basement doors to make pt more comfortable. Daughter, Aurther Loft states that the situation is hard to handle.

## 2023-08-21 NOTE — Telephone Encounter (Signed)
Patient will not come to appt. Please call daughter.

## 2023-08-23 ENCOUNTER — Encounter: Payer: Self-pay | Admitting: Family Medicine

## 2023-08-23 ENCOUNTER — Ambulatory Visit (INDEPENDENT_AMBULATORY_CARE_PROVIDER_SITE_OTHER): Payer: Medicare Other | Admitting: Family Medicine

## 2023-08-23 VITALS — BP 171/94 | HR 120 | Ht 64.0 in | Wt 123.0 lb

## 2023-08-23 DIAGNOSIS — R399 Unspecified symptoms and signs involving the genitourinary system: Secondary | ICD-10-CM

## 2023-08-23 DIAGNOSIS — R443 Hallucinations, unspecified: Secondary | ICD-10-CM

## 2023-08-23 DIAGNOSIS — R413 Other amnesia: Secondary | ICD-10-CM | POA: Diagnosis not present

## 2023-08-23 LAB — URINALYSIS, COMPLETE
Bilirubin, UA: NEGATIVE
Glucose, UA: NEGATIVE
Ketones, UA: NEGATIVE
Nitrite, UA: NEGATIVE
Protein,UA: NEGATIVE
RBC, UA: NEGATIVE
Specific Gravity, UA: 1.015 (ref 1.005–1.030)
Urobilinogen, Ur: 0.2 mg/dL (ref 0.2–1.0)
pH, UA: 7 (ref 5.0–7.5)

## 2023-08-23 LAB — MICROSCOPIC EXAMINATION
Renal Epithel, UA: NONE SEEN /hpf
Yeast, UA: NONE SEEN

## 2023-08-23 NOTE — Progress Notes (Signed)
BP (!) 171/94   Pulse (!) 120   Ht 5\' 4"  (1.626 m)   Wt 123 lb (55.8 kg)   LMP 03/21/1971   SpO2 95%   BMI 21.11 kg/m    Subjective:   Patient ID: Emily Wagner, female    DOB: 12/21/1932, 89 y.o.   MRN: [id number]  HPI: Emily Wagner is a 89 y.o. female presenting on 08/23/2023 for Hallucinations and Urinary Tract Infection (Recently completed ATB for infection. Since pt is having hallucinations would like to have urine checked again)   HPI Memory issues Patient is coming in today brought by family because of memory issues and agitation and hallucinations.  They say she is seeing things that are not there and has been doing this.  She did coming recently was treated for urinary tract infection and finished the antibiotic about 4 days ago and denies any urinary symptoms.  Family says she still having the same memory and hallucination issues.  Patient says she is very agitated and just wants to be left alone.  Relevant past medical, surgical, family and social history reviewed and updated as indicated. Interim medical history since our last visit reviewed. Allergies and medications reviewed and updated.  Review of Systems  Constitutional:  Negative for chills and fever.  Eyes:  Negative for visual disturbance.  Respiratory:  Negative for chest tightness and shortness of breath.   Cardiovascular:  Negative for chest pain and leg swelling.  Genitourinary:  Negative for difficulty urinating, dysuria, frequency and hematuria.  Skin:  Negative for rash.  Neurological:  Negative for light-headedness and headaches.  Psychiatric/Behavioral:  Positive for agitation, dysphoric mood and hallucinations. Negative for behavioral problems.   All other systems reviewed and are negative.   Per HPI unless specifically indicated above   Allergies as of 08/23/2023       Reactions   Nitrofurantoin Other (See Comments)   Liver failure   Nsaids Other (See Comments)   Kidney failure    Other Other (See Comments)   Most antibiotics require supervision per patient   Beta Adrenergic Blockers Other (See Comments)   Flonase [fluticasone Propionate] Other (See Comments)   headache   Prevnar 13 [pneumococcal 13-val Conj Vacc] Other (See Comments)   Nerve damage in left arm   Propranolol Hcl Other (See Comments)   Unknown   Hydrocodone Other (See Comments)   Alendronate Sodium Other (See Comments)   Ciprofloxacin Other (See Comments)   Patient denies allergy--"I take Cipro for bladder infections"   Codeine Other (See Comments)   Crestor [rosuvastatin Calcium] Other (See Comments)   Darvon Other (See Comments)   Lipitor [atorvastatin Calcium] Other (See Comments)   Nortriptyline Hcl Other (See Comments)   States she felt like worms where crawling under her skin.   Potassium-containing Compounds    Rash and raw areas / sore breast    Pravachol Other (See Comments)   Sulfa Antibiotics Other (See Comments)   Sulfamethoxazole Other (See Comments)   Unknown   Ultram [tramadol Hcl] Itching   Welchol [colesevelam Hcl] Other (See Comments)   Zetia [ezetimibe] Other (See Comments)   Zocor [simvastatin] Other (See Comments)        Medication List        Accurate as of August 23, 2023 11:40 AM. If you have any questions, ask your nurse or doctor.          acetaminophen 325 MG tablet Commonly known as: TYLENOL Take 325 mg by  mouth every 6 (six) hours as needed.   apixaban 2.5 MG Tabs tablet Commonly known as: ELIQUIS Take 1 tablet (2.5 mg total) by mouth 2 (two) times daily.   cephALEXin 500 MG capsule Commonly known as: KEFLEX Take 1 capsule (500 mg total) by mouth 2 (two) times daily.   cholecalciferol 25 MCG (1000 UNIT) tablet Commonly known as: VITAMIN D3 Take 1,000 Units by mouth daily.   digoxin 0.125 MG tablet Commonly known as: LANOXIN TAKE 1 TABLET BY MOUTH DAILY   diltiazem 30 MG tablet Commonly known as: CARDIZEM TAKE 1 TABLET BY MOUTH 2  TIMES DAILY.   famotidine 20 MG tablet Commonly known as: PEPCID Take 1 tablet (20 mg total) by mouth 2 (two) times daily.   levothyroxine 50 MCG tablet Commonly known as: SYNTHROID Take 1 tablet (50 mcg total) by mouth daily before breakfast.   PARoxetine 20 MG tablet Commonly known as: PAXIL Take 1 tablet (20 mg total) by mouth daily.   PRESERVISION AREDS 2 PO Take 1 capsule by mouth 2 (two) times daily.   VITAMIN B-12 PO Take 500 mcg by mouth. Take one tablet by mouth on Mon.,Wed., and Friday         Objective:   BP (!) 171/94   Pulse (!) 120   Ht 5\' 4"  (1.626 m)   Wt 123 lb (55.8 kg)   LMP 03/21/1971   SpO2 95%   BMI 21.11 kg/m   Wt Readings from Last 3 Encounters:  08/23/23 123 lb (55.8 kg)  08/07/23 124 lb (56.2 kg)  07/18/23 124 lb (56.2 kg)    Physical Exam Vitals and nursing note reviewed.  Constitutional:      General: She is not in acute distress.    Appearance: She is well-developed. She is not diaphoretic.  Eyes:     Conjunctiva/sclera: Conjunctivae normal.  Cardiovascular:     Rate and Rhythm: Normal rate and regular rhythm.     Heart sounds: Normal heart sounds. No murmur heard. Pulmonary:     Effort: Pulmonary effort is normal. No respiratory distress.     Breath sounds: Normal breath sounds. No wheezing.  Musculoskeletal:        General: Normal range of motion.  Skin:    General: Skin is warm and dry.     Findings: No rash.  Neurological:     Mental Status: She is alert and oriented to person, place, and time.     Coordination: Coordination normal.  Psychiatric:        Behavior: Behavior normal.     Urinalysis: 3+ leukocytes, few bacteria, 0-10 epithelial cells and 0-2 RBCs, will await culture, no major signs of infection except for some leukocytes but not seen on microscope, will await culture  Assessment & Plan:   Problem List Items Addressed This Visit   None Visit Diagnoses     Memory disorder    -  Primary   Relevant  Orders   Ambulatory referral to Neurology   UTI symptoms       Relevant Orders   Urine Culture   Urinalysis, Complete   Hallucination       Relevant Orders   Ambulatory referral to Neurology       No major symptoms except confusion and hallucinations, finish the antibiotic, will await culture from this.  Did referral to neurology as well because of memory and hallucinations. Follow up plan: Return if symptoms worsen or fail to improve.  Counseling provided for all of the  vaccine components Orders Placed This Encounter  Procedures   Urine Culture   Urinalysis, Complete   Ambulatory referral to Neurology    Arville Care, MD [facility] 08/23/2023, 11:40 AM

## 2023-08-25 LAB — URINE CULTURE

## 2023-08-27 ENCOUNTER — Telehealth: Payer: Self-pay | Admitting: Family Medicine

## 2023-08-27 NOTE — Telephone Encounter (Signed)
Left message to call back  

## 2023-08-27 NOTE — Telephone Encounter (Signed)
Emily Wagner asked that although results come back neg, can Dr [name] send in a abx because pt is having hallucinations due to UTI. Use [facility]

## 2023-08-27 NOTE — Telephone Encounter (Signed)
I do not think that her hallucinations are coming from a urinary tract infection.  I think her hallucinations are coming from the neurological disorder such as a type of dementia.  This is why I want her to see neurology.

## 2023-08-28 NOTE — Telephone Encounter (Signed)
Daughter Aurther Loft made aware of [name]'s recommendations.  Aurther Loft would like for info to be text to her cell phone. Informed that we are unable to send info that way. She will come by the office and pick up referral information. Order and number placed up front for pick up.  In the meantime daughter would like to see if [name] would prescribe a medication for agitation. Pt is worse at night.  Per daughter pt is refusing to see another doctor and she may not be able to get her to go to Neurology.  Aware [name] will return to office tomorrow for a call back.

## 2023-08-30 MED ORDER — TRAZODONE HCL 50 MG PO TABS: 25.0000 mg | ORAL_TABLET | Freq: Every evening | ORAL | 1 refills | Status: DC | PRN

## 2023-08-30 NOTE — Telephone Encounter (Signed)
Left message making pts daughter aware and to call back if needed.

## 2023-08-30 NOTE — Telephone Encounter (Signed)
I sent trazodone for her, she can take that in the evenings to calm her agitation down, it will also help her sleep.

## 2023-08-31 ENCOUNTER — Other Ambulatory Visit: Payer: Self-pay | Admitting: Family Medicine

## 2023-08-31 DIAGNOSIS — I10 Essential (primary) hypertension: Secondary | ICD-10-CM

## 2023-09-25 ENCOUNTER — Telehealth: Payer: Self-pay | Admitting: Family Medicine

## 2023-09-25 ENCOUNTER — Telehealth: Payer: Self-pay

## 2023-09-25 NOTE — Telephone Encounter (Signed)
Forms completed and faxed to [facility] today.   Fax [phone]  Copy of form at [facility] Desk and originals sent to be scanned in pts chart

## 2023-09-26 NOTE — Telephone Encounter (Signed)
Appt scheduled with [facility] 10/31 at 10:25am.  Pt states that she fell and hit her lift chair hard. She is very sore in her upper back. She would like a xray to check. States that she did hit her head but denies any concussion symptoms.

## 2023-09-27 ENCOUNTER — Ambulatory Visit (INDEPENDENT_AMBULATORY_CARE_PROVIDER_SITE_OTHER): Payer: Medicare Other | Admitting: Family

## 2023-09-27 ENCOUNTER — Encounter: Payer: Self-pay | Admitting: Family

## 2023-09-27 ENCOUNTER — Ambulatory Visit (INDEPENDENT_AMBULATORY_CARE_PROVIDER_SITE_OTHER): Payer: Medicare Other

## 2023-09-27 VITALS — BP 160/90 | HR 110 | Temp 99.3°F | Ht 64.0 in | Wt 122.4 lb

## 2023-09-27 DIAGNOSIS — M546 Pain in thoracic spine: Secondary | ICD-10-CM

## 2023-09-27 DIAGNOSIS — S22000A Wedge compression fracture of unspecified thoracic vertebra, initial encounter for closed fracture: Secondary | ICD-10-CM | POA: Diagnosis not present

## 2023-09-27 DIAGNOSIS — W19XXXA Unspecified fall, initial encounter: Secondary | ICD-10-CM

## 2023-09-27 DIAGNOSIS — Y92009 Unspecified place in unspecified non-institutional (private) residence as the place of occurrence of the external cause: Secondary | ICD-10-CM

## 2023-09-27 NOTE — Progress Notes (Signed)
Subjective:    Patient ID: Emily Wagner, female    DOB: 12/13/1932, 89 y.o.   MRN: [id number]  Chief Complaint  Patient presents with   Fall   Back Pain    upper   Pt presents to the office today for a fall in her home about a week ago. She reports she fell in between two chairs and landed on her right back.  Fall The accident occurred 5 to 7 days ago. The point of impact was the right shoulder (back). The pain is at a severity of 4/10 (when she coughs the pain is 7-8). The symptoms are aggravated by rotation. Pertinent negatives include no fever, headaches, hematuria, loss of consciousness, numbness, tingling or visual change. She has tried rest and acetaminophen for the symptoms. The treatment provided mild relief.  Back Pain Pertinent negatives include no fever, headaches, numbness or tingling.      Review of Systems  Constitutional:  Negative for fever.  Genitourinary:  Negative for hematuria.  Musculoskeletal:  Positive for back pain.  Neurological:  Negative for tingling, loss of consciousness, numbness and headaches.  All other systems reviewed and are negative.      Objective:   Physical Exam Vitals reviewed.  Constitutional:      General: She is not in acute distress.    Appearance: She is well-developed.  HENT:     Head: Normocephalic and atraumatic.  Eyes:     Pupils: Pupils are equal, round, and reactive to light.  Neck:     Thyroid: No thyromegaly.  Cardiovascular:     Rate and Rhythm: Normal rate and regular rhythm.     Heart sounds: Normal heart sounds. No murmur heard. Pulmonary:     Effort: Pulmonary effort is normal. No respiratory distress.     Breath sounds: Normal breath sounds. No wheezing.  Abdominal:     General: Bowel sounds are normal. There is no distension.     Palpations: Abdomen is soft.     Tenderness: There is no abdominal tenderness.  Musculoskeletal:        General: Tenderness present. Normal range of motion.       Arms:      Cervical back: Normal range of motion and neck supple.     Comments: Pain in right upper thoracic area with flexion  Skin:    General: Skin is warm and dry.  Neurological:     Mental Status: She is alert and oriented to person, place, and time.     Cranial Nerves: No cranial nerve deficit.     Motor: Weakness present.     Gait: Gait abnormal (using rolling walker).     Deep Tendon Reflexes: Reflexes are normal and symmetric.  Psychiatric:        Behavior: Behavior normal.        Thought Content: Thought content normal.        Judgment: Judgment normal.     BP (!) 160/90   Pulse (!) 110   Temp 99.3 F (37.4 C)   Ht 5\' 4"  (1.626 m)   Wt 122 lb 6.4 oz (55.5 kg)   LMP 03/21/1971   SpO2 97%   BMI 21.01 kg/m        Assessment & Plan:   Kazlynn Hibberd comes in today with chief complaint of Fall and Back Pain (upper)   Diagnosis and orders addressed:  1. Acute right-sided thoracic back pain - DG Thoracic Spine 2 View  2.  Fall in home, initial encounter - DG Thoracic Spine 2 View  X-ray pending  Rest Fall precautions discussed Tylenol as needed  Keep follow up with PCP  Jannifer Rodney, FNP

## 2023-09-27 NOTE — Patient Instructions (Signed)
Fall Prevention in the Home, Adult Falls can cause injuries and affect people of all ages. There are many simple things that you can do to make your home safe and to help prevent falls. If you need it, ask for help making these changes. What actions can I take to prevent falls? General information Use good lighting in all rooms. Make sure to: Replace any light bulbs that burn out. Turn on lights if it is dark and use night-lights. Keep items that you use often in easy-to-reach places. Lower the shelves around your home if needed. Move furniture so that there are clear paths around it. Do not keep throw rugs or other things on the floor that can make you trip. If any of your floors are uneven, fix them. Add color or contrast paint or tape to clearly mark and help you see: Grab bars or handrails. First and last steps of staircases. Where the edge of each step is. If you use a ladder or stepladder: Make sure that it is fully opened. Do not climb a closed ladder. Make sure the sides of the ladder are locked in place. Have someone hold the ladder while you use it. Know where your pets are as you move through your home. What can I do in the bathroom?     Keep the floor dry. Clean up any water that is on the floor right away. Remove soap buildup in the bathtub or shower. Buildup makes bathtubs and showers slippery. Use non-skid mats or decals on the floor of the bathtub or shower. Attach bath mats securely with double-sided, non-slip rug tape. If you need to sit down while you are in the shower, use a non-slip stool. Install grab bars by the toilet and in the bathtub and shower. Do not use towel bars as grab bars. What can I do in the bedroom? Make sure that you have a light by your bed that is easy to reach. Do not use any sheets or blankets on your bed that hang to the floor. Have a firm bench or chair with side arms that you can use for support when you get dressed. What can I do in  the kitchen? Clean up any spills right away. If you need to reach something above you, use a sturdy step stool that has a grab bar. Keep electrical cables out of the way. Do not use floor polish or wax that makes floors slippery. What can I do with my stairs? Do not leave anything on the stairs. Make sure that you have a light switch at the top and the bottom of the stairs. Have them installed if you do not have them. Make sure that there are handrails on both sides of the stairs. Fix handrails that are broken or loose. Make sure that handrails are as long as the staircases. Install non-slip stair treads on all stairs in your home if they do not have carpet. Avoid having throw rugs at the top or bottom of stairs, or secure the rugs with carpet tape to prevent them from moving. Choose a carpet design that does not hide the edge of steps on the stairs. Make sure that carpet is firmly attached to the stairs. Fix any carpet that is loose or worn. What can I do on the outside of my home? Use bright outdoor lighting. Repair the edges of walkways and driveways and fix any cracks. Clear paths of anything that can make you trip, such as tools or rocks. Add   color or contrast paint or tape to clearly mark and help you see high doorway thresholds. Trim any bushes or trees on the main path into your home. Check that handrails are securely fastened and in good repair. Both sides of all steps should have handrails. Install guardrails along the edges of any raised decks or porches. Have leaves, snow, and ice cleared regularly. Use sand, salt, or ice melt on walkways during winter months if you live where there is ice and snow. In the garage, clean up any spills right away, including grease or oil spills. What other actions can I take? Review your medicines with your health care provider. Some medicines can make you confused or feel dizzy. This can increase your chance of falling. Wear closed-toe shoes that  fit well and support your feet. Wear shoes that have rubber soles and low heels. Use a cane, walker, scooter, or crutches that help you move around if needed. Talk with your provider about other ways that you can decrease your risk of falls. This may include seeing a physical therapist to learn to do exercises to improve movement and strength. Where to find more information Centers for Disease Control and Prevention, [employer]: cdc.gov National Institute on Aging: nia.nih.gov National Institute on Aging: nia.nih.gov Contact a health care provider if: You are afraid of falling at home. You feel weak, drowsy, or dizzy at home. You fall at home. Get help right away if you: Lose consciousness or have trouble moving after a fall. Have a fall that causes a head injury. These symptoms may be an emergency. Get help right away. Call 911. Do not wait to see if the symptoms will go away. Do not drive yourself to the hospital. This information is not intended to replace advice given to you by your health care provider. Make sure you discuss any questions you have with your health care provider. Document Revised: 07/17/2022 Document Reviewed: 07/17/2022 Elsevier Patient Education  2024 Elsevier Inc.  

## 2023-10-03 ENCOUNTER — Telehealth: Payer: Self-pay | Admitting: *Deleted

## 2023-10-03 NOTE — Telephone Encounter (Signed)
Copied from CRM [id number]. Topic: General - Call Back - No Documentation >> Oct 03, 2023  1:07 PM Alcus Dad H wrote: Reason for CRM: Pt stated has been awaiting a call back from Mrs. Morrie Sheldon for 2 days in regards to something she wants to talk to her about. Did not give specifics but did mention something about a flu shot.

## 2023-10-03 NOTE — Telephone Encounter (Signed)
Pt has been informed that results are not back yet and that it may be up to two weeks for results. Pt understood.  Made aware that she has not received a flu vaccine yet. She will call back to schedule since she does not know when her daughter can bring her.

## 2023-10-10 ENCOUNTER — Telehealth: Payer: Self-pay | Admitting: *Deleted

## 2023-10-10 NOTE — Telephone Encounter (Signed)
 Copied from CRM [id number]. Topic: General - Other >> Oct 10, 2023  3:27 PM Sasha H wrote: Reason for CRM: pt would like to speak to nurse Morrie Sheldon, would not say why.

## 2023-10-11 ENCOUNTER — Encounter: Payer: Self-pay | Admitting: Neurology

## 2023-10-11 ENCOUNTER — Ambulatory Visit: Payer: Medicare Other | Admitting: Neurology

## 2023-10-11 ENCOUNTER — Telehealth: Payer: Self-pay | Admitting: Family Medicine

## 2023-10-11 VITALS — BP 162/80 | Ht 64.0 in | Wt 136.0 lb

## 2023-10-11 DIAGNOSIS — M6281 Muscle weakness (generalized): Secondary | ICD-10-CM

## 2023-10-11 DIAGNOSIS — R799 Abnormal finding of blood chemistry, unspecified: Secondary | ICD-10-CM | POA: Diagnosis not present

## 2023-10-11 DIAGNOSIS — Z79899 Other long term (current) drug therapy: Secondary | ICD-10-CM | POA: Diagnosis not present

## 2023-10-11 DIAGNOSIS — B91 Sequelae of poliomyelitis: Secondary | ICD-10-CM

## 2023-10-11 DIAGNOSIS — R441 Visual hallucinations: Secondary | ICD-10-CM | POA: Diagnosis not present

## 2023-10-11 DIAGNOSIS — Z0001 Encounter for general adult medical examination with abnormal findings: Secondary | ICD-10-CM | POA: Diagnosis not present

## 2023-10-11 DIAGNOSIS — Z8673 Personal history of transient ischemic attack (TIA), and cerebral infarction without residual deficits: Secondary | ICD-10-CM | POA: Diagnosis not present

## 2023-10-11 NOTE — Telephone Encounter (Signed)
Patient is calling in she would like a callback from the nurse she stated she was talking to the nurse and the call dropped.

## 2023-10-11 NOTE — Patient Instructions (Signed)
I had a long discussion with the patient and daughter regarding her visual and auditory hallucinations and discussed plan for evaluation and treatment and answered questions.  Recommend she increase Seroquel dose to 75 mg at night and check lab work today for CMP, CBC, digoxin level, thyroid function tests, EEG and MRI scan of the brain.  Continue Eliquis for stroke prevention for her atrial fibrillation and maintain aggressive risk factor modification with strict control of hypertension with blood pressure goal below 130/90, lipids with LDL cholesterol goal below 70 mg percent and diabetes with hemoglobin A1c goal below 6.5%.  I also counseled her to use her walker at all times close fall precautions.  She will return for follow-up in the future in 3 months with Shanda Bumps nurse practitioner or call earlier if necessary.

## 2023-10-11 NOTE — Progress Notes (Signed)
[facility] [address] [place]. [place] [address] [phone]       OFFICE FOLLOW UP NOTE  [name] Date of Birth:  [birth year] Medical Record Number:  [id number]   Referring MD: Emily Wagner  Primary neurologist: Dr. Pearlean Wagner Reason for Referral: Stroke   Chief Complaint  Patient presents with   Follow-up    Patient in room #19 daughter. Patient states is here today to discuss her memory issues. Patient states she she fell last night but needed hither head. Patient wants to know why she falling.       HPI:  Update 10/11/2023 : Patient is seen today after last visit nearly a year and a half ago.  She is referred by primary physician for concerns about increasing hallucinations and cognitive issues.  Patient is accompanied by her daughter.  She has been having for the last 4 months visual and auditory hallucinations.  She sees objects in animals which she knows are not real.  Initially she is to see them and they are outside the house but of late it has been more distressing since she says they occur inside her house.  Demulcent objects change.  She knows they are not real but she gets upset.  She saw primary care physician initially thought it was related to trazodone and he stopped it and tapered the melatonin as well.  She was thought to have UTI and treated with antibiotics but despite treatment his hallucinations have persisted.  She has been on Seroquel 50 mg at night for the last several months to help her sleep.  She denies any headache, focal extremity weakness or numbness.  She actually did quite well on cognitive testing today and scored 30/30 on Mini-Mental status exam.  She denies any recent strokelike symptoms.  She did have a CT scan of the head on 06/08/2023 which showed no acute abnormality.  Lab work on 07/04/2023 showed LDL-cholesterol to be elevated at 156 mg percent. Update 03/27/2022 JM: 89 year old female with history of right MCA stroke  in 03/2021 who returns today for 64-month follow-up visit. She is accompanied by her daughter, Emily Wagner.  She has been doing well since prior visit without any new or reoccurring stroke/TIA symptoms.  Cognition stable since prior visit.  Has since returned back to driving without difficulty short distance only.  Continues to maintain ADLs and IADLs independently.  Continued use of RW or cane at all times (baseline with LLE weakness 2/2 polio), no recent falls.  Compliant on Eliquis, denies side effects.  Blood pressure today 122/67. She does c/o LE proximal pain from hx of polio, she questions use of pain medication that is not addictive. She has not previously been seen by pain management as she has multiple medication allergies limiting treatment options. Currently using Voltaren gel with some benefit. Cold and rainy weather worsens pain.  No new concerns at this time.      History provided for reference purposes only Update 09/20/2021 JM: Returns for sooner requested visit.  She is requesting a memory evaluation in order to return to driving. She is accompanied by her daughter.  Shortly after her stroke, she was advised no driving by PCP Dr. Louanne Skye due to memory concerns and confusion.  She recently completed HH therapies after working with PT/OT/SLP for 3 month duration and reports making excellent recovery currently at baseline functioning.  She continues to live alone. She is able to maintain all ADLs and majority of IADLs except for  driving. She has since returned back to managing her finances and bill paying as well as medications. Continues use of RW which is baseline for hx of polio with mild LLE weakness. Prior to stroke, only driving short distance. She would refrain from nighttime driving and avoid busier roads or highways.  MMSE today 29/30 (missed 1 recall word)  Compliant on Eliquis without side effects.  Blood pressure today 132/76.   Denies new stroke/TIA symptoms. No further concerns  at this time.   Initial visit 07/18/2021 Dr. Pearlean Wagner: Emily Wagner is a pleasant 89 year old Caucasian lady seen today for initial office consultation visit for stroke.  History is obtained from the patient and her daughter is accompanying her as well as review of electronic medical records in epic as well as care everywhere.  Pertinent imaging films were also reviewed.  She has past medical history of coronary artery disease, hypertension, hyperlipidemia, osteopenia who presented on 03/27/2021 with being found by family on the floor near her recliner at home.  Her last known well was at 1930 hrs. on 03/26/2021.  She lives alone but is independent of most actives of daily living and does walk with a walker.  On EMS arrival she had right gaze deviation with left facial droop and left hemiparesis.  CT scan showed right temporal, insular and basal ganglia hypodensities and CT angiogram of the head and neck showed occlusion of the right internal carotid artery just beyond the bulb extending to the supraclinoid portion and CT perfusion scan showed a large penumbra.  Patient's family consented for mechanical thrombectomy but since there was another patient on the table at [facility] she had to be emergently  transferred to [facility] using interventional radiology transfer line after discussing the case with Dr. Lanna Poche who accepted the patient for emergent transfer and performed emergent thrombectomy resulting in TICI 2C final revascularization.  Review of records at [facility] in Care Everywhere.  Initial NIH stroke scale on admission was 14.  MRI scan showed moderate sized acute infarct involving right basal ganglia, insula and right temporoparietal lobes with tiny acute infarct in the right cerebellum with petechial hemorrhage but no frank hematoma.  Transthoracic echo showed left ventricular ejection fraction of 60 to 65%.  Hemoglobin A1c is 5.3.  Lipid profile was 141.  Patient was transferred to  inpatient rehab at [facility] skilled nursing facility for 2 and half weeks and subsequently recovered to go back home and live independently.  Her NIH stroke scale was improved to 2 at discharge.  Patient is able to walk with a walker.  She states that most of her strength has improved but her stamina has not she gets tired more easily particularly when she is walking for a while and has to rest.  She is no longer able to do finances as it has been some cognitive deterioration noted and the daughter has taken that over.  Patient was subsequently found to have paroxysmal A. fib on a 30-day heart monitor and now has been switched to Eliquis.  She does complain of some minor bruising but no bleeding.  Blood pressure is well controlled today it is 129/78.  He has not had any recurrent stroke or TIA symptoms.      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 assistant  Tobacco Use   Smoking status: Never   Smokeless tobacco: Never  Vaping Use   Vaping status: Never Used   Substance and Sexual Activity   Alcohol use: No   Drug use: No   Sexual activity: Not on file  Other Topics Concern   Not on file  Social History Narrative   01/04/22 lives alone   Daughter and granddaughter live close by and visit daily   Right Handed   Drinks no caffeine daily   Social Determinants of Health   Financial Resource Strain: Medium Risk (01/08/2023)   Overall Financial Resource Strain (CARDIA)    Difficulty of Paying Living Expenses: Somewhat hard  Food Insecurity: No Food Insecurity (01/08/2023)   Hunger Vital Sign    Worried About Running Out of Food in the Last Year: Never true    Ran Out of Food in the Last Year: Never true  Transportation Needs: No Transportation Needs (01/08/2023)   PRAPARE - Administrator, Civil Service (Medical): No    Lack of Transportation (Non-Medical): No  Physical Activity: Inactive (01/08/2023)   Exercise Vital Sign    Days of Exercise per Week: 0 days    Minutes of Exercise per Session: 0 min  Stress: No Stress Concern Present (01/08/2023)   [employer] of Occupational Health - Occupational Stress Questionnaire    Feeling of Stress : Not at all  Social Connections: Moderately Integrated (01/08/2023)   Social Connection and Isolation Panel [NHANES]    Frequency of Communication with Friends and Family: More than three times a week    Frequency of Social Gatherings with Friends and Family: More than three times a week    Attends Religious Services: More than 4 times per year    Active Member of [employer] or Organizations: Yes    Attends Banker Meetings: More than 4 times per year    Marital Status: Widowed  Intimate Partner Violence: Not At Risk (01/08/2023)   Humiliation, Afraid, Rape, and Kick questionnaire    Fear of Current or Ex-Partner: No    Emotionally Abused: No    Physically Abused: No    Sexually Abused: No    Medications:   Current Outpatient Medications on File Prior to Visit  Medication  Sig Dispense Refill   acetaminophen (TYLENOL) 325 MG tablet Take 325 mg by mouth every 6 (six) hours as needed.     apixaban (ELIQUIS) 2.5 MG TABS tablet Take 1 tablet (2.5 mg total) by mouth 2 (two) times daily. 180 tablet 1   cholecalciferol (VITAMIN D3) 25 MCG (1000 UNIT)  tablet Take 1,000 Units by mouth daily.     Cyanocobalamin (VITAMIN B-12 PO) Take 500 mcg by mouth. Take one tablet by mouth on Mon.,Wed., and Friday     digoxin (LANOXIN) 0.125 MG tablet TAKE 1 TABLET BY MOUTH DAILY 90 tablet 2   diltiazem (CARDIZEM) 30 MG tablet TAKE 1 TABLET BY MOUTH 2 TIMES DAILY. 180 tablet 3   famotidine (PEPCID) 20 MG tablet Take 1 tablet (20 mg total) by mouth 2 (two) times daily. 180 tablet 3   levothyroxine (SYNTHROID) 50 MCG tablet TAKE 1 TABLET BY MOUTH DAILY BEFORE BREAKFAST 90 tablet 3   Multiple Vitamins-Minerals (PRESERVISION AREDS 2 PO) Take 1 capsule by mouth 2 (two) times daily.     PARoxetine (PAXIL) 20 MG tablet Take 1 tablet (20 mg total) by mouth daily. 90 tablet 3   QUEtiapine (SEROQUEL) 50 MG tablet Take 50 mg by mouth at bedtime.     cephALEXin (KEFLEX) 500 MG capsule Take 1 capsule (500 mg total) by mouth 2 (two) times daily. (Patient not taking: Reported on 10/11/2023) 14 capsule 0   traZODone (DESYREL) 50 MG tablet Take 0.5-1 tablets (25-50 mg total) by mouth at bedtime as needed for sleep. (Patient not taking: Reported on 10/11/2023) 90 tablet 1   No current facility-administered medications on file prior to visit.    Allergies:   Allergies  Allergen Reactions   Nitrofurantoin Other (See Comments)    Liver failure   Nsaids Other (See Comments)    Kidney failure   Other Other (See Comments)    Most antibiotics require supervision per patient   Beta Adrenergic Blockers Other (See Comments)   Flonase [Fluticasone Propionate] Other (See Comments)    headache   Prevnar 13 [Pneumococcal 13-Val Conj Vacc] Other (See Comments)    Nerve damage in left arm   Propranolol Hcl  Other (See Comments)    Unknown   Hydrocodone Other (See Comments)   Alendronate Sodium Other (See Comments)   Ciprofloxacin Other (See Comments)    Patient denies allergy--"I take Cipro for bladder infections"   Codeine Other (See Comments)   Crestor [Rosuvastatin Calcium] Other (See Comments)   Darvon Other (See Comments)   Lipitor [Atorvastatin Calcium] Other (See Comments)   Nortriptyline Hcl Other (See Comments)    States she felt like worms where crawling under her skin.   Potassium-Containing Compounds     Rash and raw areas / sore breast    Pravachol Other (See Comments)   Sulfa Antibiotics Other (See Comments)   Sulfamethoxazole Other (See Comments)    Unknown   Ultram [Tramadol Hcl] Itching   Welchol [Colesevelam Hcl] Other (See Comments)   Zetia [Ezetimibe] Other (See Comments)   Zocor [Simvastatin] Other (See Comments)    Physical Exam Today's Vitals   10/11/23 0813 10/11/23 0829 10/11/23 0830  BP: (!) 177/104 (!) 172/105 (!) 162/80  Weight: 136 lb (61.7 kg)    Height: 5\' 4"  (1.626 m)      Body mass index is 23.34 kg/m.   General: Very pleasant frail elderly Caucasian lady, seated, in no evident distress Head: head normocephalic and atraumatic.   Neck: supple with no carotid or supraclavicular bruits Cardiovascular: irregular rate and rhythm, no murmurs Musculoskeletal: no deformity Skin:  no rash/petichiae Vascular:  Normal pulses all extremities  Neurologic Exam Mental Status: Awake and fully alert. Fluent speech and language.  Oriented to place and time. Recent and remote memory intact. Attention span, concentration and fund of knowledge appropriate.  Mood and affect appropriate.  MMSE 30/30.  Able to name 12 animals which can walk on 4 legs.  Clock drawing 4/4.    10/11/2023    8:22 AM 09/20/2021    9:27 AM 08/23/2018   10:57 AM  MMSE - Mini Mental State Exam  Orientation to time 5 5 5   Orientation to Place 5 5 5   Registration 3 3 3   Attention/  Calculation 5 5 5   Recall 3 2 3   Language- name 2 objects 2 2 2   Language- repeat 1 1 1   Language- follow 3 step command 3 3 3   Language- read & follow direction 1 1 1   Write a sentence 1 1 1   Copy design 1 1 1   Total score 30 29 30    Cranial Nerves: Pupils equal, briskly reactive to light. Extraocular movements full without nystagmus. Visual fields full to confrontation. HOH bilaterally. facial sensation intact. Face, tongue, palate moves normally and symmetrically.  Motor: Normal bulk and tone. Normal strength in all tested extremity muscles except mild LLE weakness chronic from polio .  Diminished fine finger movements on the left.  Orbits right over left upper extremity.  Mild action tremor of the right greater than left upper extremity.  No resting tremor or pill-rolling quality. Sensory.: intact to touch , pinprick , position and vibratory sensation.  Coordination: Rapid alternating movements normal in all extremities. Finger-to-nose and heel-to-shin performed accurately bilaterally. Gait and Station: Arises from chair without difficulty. Stance is stooped.  Using a cane today.  Gait demonstrates slow cautious steps with decreased stride length and step height with mild imbalance/unsteadiness and uses a  cane.  Tandem walking not attempted.   Reflexes: 1+ and symmetric. Toes downgoing.       10/11/2023    8:22 AM 09/20/2021    9:27 AM 08/23/2018   10:57 AM  MMSE - Mini Mental State Exam  Orientation to time 5 5 5   Orientation to Place 5 5 5   Registration 3 3 3   Attention/ Calculation 5 5 5   Recall 3 2 3   Language- name 2 objects 2 2 2   Language- repeat 1 1 1   Language- follow 3 step command 3 3 3   Language- read & follow direction 1 1 1   Write a sentence 1 1 1   Copy design 1 1 1   Total score 30 29 30        ASSESSMENT: 89 year old Caucasian lady with right MCA infarct secondary to right carotid occlusion likely from paroxysmal A. fib and May 2022 treated with emergent  mechanical thrombectomy with TICI 2 C vascularization and excellent clinical outcome and complete stroke recovery.  Vascular risk factors of hypertension, hyperlipidemia, paroxysmal A. fib and age.  New onset auditory and visual hallucinations which are formed and fixed without significant cognitive decline in the last 3 to 4 months of unclear etiology    PLAN:   I had a long discussion with the patient and daughter regarding her visual and auditory hallucinations and discussed plan for evaluation and treatment and answered questions.  Recommend she increase Seroquel dose to 75 mg at night and check lab work today for CMP, CBC, digoxin level, thyroid function tests, EEG and MRI scan of the brain.  Continue Eliquis for stroke prevention for her atrial fibrillation and maintain aggressive risk factor modification with strict control of hypertension with blood pressure goal below 130/90, lipids with LDL cholesterol goal below 70 mg percent and diabetes with hemoglobin A1c goal below 6.5%.  I also  counseled her to use her walker at all times close fall precautions.  She will return for follow-up in the future in 3 months with Shanda Bumps nurse practitioner or call earlier if necessary. Greater than 50% time during this 40-minute visit was spent in counseling and coordination of care about new diagnosis of hallucinations as well as previous stroke and answering questions. Delia Heady, MD  [facility] [address] [place], [place] [address]  Phone [phone] Fax [phone] Note: This document was prepared with digital dictation and possible smart phrase technology. Any transcriptional errors that result from this process are unintentional.

## 2023-10-12 LAB — URINALYSIS, ROUTINE W REFLEX MICROSCOPIC
Bilirubin, UA: NEGATIVE
Glucose, UA: NEGATIVE
Ketones, UA: NEGATIVE
Nitrite, UA: NEGATIVE
Specific Gravity, UA: 1.019 (ref 1.005–1.030)
Urobilinogen, Ur: 0.2 mg/dL (ref 0.2–1.0)
pH, UA: 6 (ref 5.0–7.5)

## 2023-10-12 LAB — MICROSCOPIC EXAMINATION
Casts: NONE SEEN /[LPF]
RBC, Urine: NONE SEEN /[HPF] (ref 0–2)
WBC, UA: >30 /[HPF] — AB (ref 0–5)

## 2023-10-16 ENCOUNTER — Telehealth: Payer: Self-pay | Admitting: Neurology

## 2023-10-16 ENCOUNTER — Other Ambulatory Visit: Payer: Self-pay | Admitting: Family

## 2023-10-16 DIAGNOSIS — S22030A Wedge compression fracture of third thoracic vertebra, initial encounter for closed fracture: Secondary | ICD-10-CM

## 2023-10-16 NOTE — Telephone Encounter (Signed)
[employer] medicare NPR sent to GI [phone] 

## 2023-10-16 NOTE — Progress Notes (Signed)
 Referral to Ortho

## 2023-10-16 NOTE — Telephone Encounter (Signed)
Pt request flu shot appt. Appt scheduled 11/20 at 11am.  Pt requested xray results for back. This was performed on 10/31. The results are not back. Will contact xray tech to reach out to radiology.  Pt wanted to know if we had heard anything in regards to placement into an assisted living facility. We have [facility] the forms to [facility] but have had no response at this time. Pt will contact her insurance to see where she stands with placement.  Pt states that she is falling more and has an MRI of brain coming up to rule out stroke.

## 2023-10-17 ENCOUNTER — Ambulatory Visit (INDEPENDENT_AMBULATORY_CARE_PROVIDER_SITE_OTHER): Payer: Medicare Other | Admitting: Family Medicine

## 2023-10-17 DIAGNOSIS — Z23 Encounter for immunization: Secondary | ICD-10-CM | POA: Diagnosis not present

## 2023-10-18 ENCOUNTER — Telehealth: Payer: Self-pay | Admitting: Family Medicine

## 2023-10-18 DIAGNOSIS — S22030D Wedge compression fracture of third thoracic vertebra, subsequent encounter for fracture with routine healing: Secondary | ICD-10-CM

## 2023-10-18 NOTE — Telephone Encounter (Signed)
Copied from CRM [id number]. Topic: Referral - Request for Referral >> Oct 18, 2023 10:26 AM [name] wrote: Did the patient discuss referral with their provider in the last year? Yes (If No - schedule appointment) (If Yes - send message)  Appointment offered? No  Type of order/referral and detailed reason for visit: Patient was provided a referral for a Ortho provider for the most recent compression fracture (see most recent encounter) - Patient's daughter is requesting the referral be sent to their office (see information below) so that they can schedule an appointment - Patient would prefer to see Dr. Nolene Ebbs.   Preference of office, provider, location: Dr. Nolene Ebbs - [phone] - in [place], [place] behind the Cancer center.   If referral order, have you been seen by this specialty before? Yes (If Yes, this issue or another issue? When? Where?  Can we respond through MyChart? No

## 2023-10-19 NOTE — Telephone Encounter (Signed)
Placed referral for the patient.

## 2023-10-22 ENCOUNTER — Other Ambulatory Visit: Payer: Self-pay | Admitting: Nurse Practitioner

## 2023-10-22 DIAGNOSIS — S22030D Wedge compression fracture of third thoracic vertebra, subsequent encounter for fracture with routine healing: Secondary | ICD-10-CM

## 2023-10-22 NOTE — Progress Notes (Signed)
Orders Placed This Encounter  Procedures   AMB referral to orthopedics    Referral Priority:   Routine    Referral Type:   Consultation    Number of Visits Requested:   1

## 2023-10-27 LAB — TSH+T3+FREE T4+T3 FREE
Free T-3: 3 pg/mL
Free T4 by Dialysis: 1.4 ng/dL
TSH: 2 uU/mL
Triiodothyronine (T-3), Serum: 77 ng/dL

## 2023-10-27 LAB — CBC
Hematocrit: 42.8 % (ref 34.0–46.6)
Hemoglobin: 12.9 g/dL (ref 11.1–15.9)
MCH: 27.4 pg (ref 26.6–33.0)
MCHC: 30.1 g/dL — ABNORMAL LOW (ref 31.5–35.7)
MCV: 91 fL (ref 79–97)
Platelets: 314 10*3/uL (ref 150–450)
RBC: 4.7 x10E6/uL (ref 3.77–5.28)
RDW: 15.4 % (ref 11.7–15.4)
WBC: 7.6 10*3/uL (ref 3.4–10.8)

## 2023-10-27 LAB — COMPREHENSIVE METABOLIC PANEL
ALT: 9 [IU]/L (ref 0–32)
AST: 21 [IU]/L (ref 0–40)
Albumin: 4.5 g/dL (ref 3.7–4.7)
Alkaline Phosphatase: 148 [IU]/L — ABNORMAL HIGH (ref 44–121)
BUN/Creatinine Ratio: 23 (ref 12–28)
BUN: 24 mg/dL (ref 8–27)
Bilirubin Total: 0.3 mg/dL (ref 0.0–1.2)
CO2: 23 mmol/L (ref 20–29)
Calcium: 10 mg/dL (ref 8.7–10.3)
Chloride: 101 mmol/L (ref 96–106)
Creatinine, Ser: 1.05 mg/dL — ABNORMAL HIGH (ref 0.57–1.00)
Globulin, Total: 3.1 g/dL (ref 1.5–4.5)
Glucose: 110 mg/dL — ABNORMAL HIGH (ref 70–99)
Potassium: 4.3 mmol/L (ref 3.5–5.2)
Sodium: 141 mmol/L (ref 134–144)
Total Protein: 7.6 g/dL (ref 6.0–8.5)
eGFR: 51 mL/min/{1.73_m2} — ABNORMAL LOW (ref >59)

## 2023-10-27 LAB — DIGOXIN LEVEL: Digoxin, Serum: 0.7 ng/mL (ref 0.5–0.9)

## 2023-10-29 ENCOUNTER — Telehealth: Payer: Self-pay | Admitting: Neurology

## 2023-10-29 NOTE — Telephone Encounter (Signed)
Pt's daughter is asking for a call to confirm if the cost of the MRI has been confirmed to be fully covered by pt's insurance, please call her to discuss.

## 2023-11-01 ENCOUNTER — Ambulatory Visit (INDEPENDENT_AMBULATORY_CARE_PROVIDER_SITE_OTHER): Payer: Medicare Other

## 2023-11-01 ENCOUNTER — Encounter: Payer: Self-pay | Admitting: Family Medicine

## 2023-11-01 ENCOUNTER — Ambulatory Visit (INDEPENDENT_AMBULATORY_CARE_PROVIDER_SITE_OTHER): Payer: Medicare Other | Admitting: Family Medicine

## 2023-11-01 VITALS — BP 163/78 | HR 91 | Ht 64.0 in | Wt 121.0 lb

## 2023-11-01 DIAGNOSIS — B351 Tinea unguium: Secondary | ICD-10-CM | POA: Diagnosis not present

## 2023-11-01 DIAGNOSIS — M533 Sacrococcygeal disorders, not elsewhere classified: Secondary | ICD-10-CM

## 2023-11-01 DIAGNOSIS — M79676 Pain in unspecified toe(s): Secondary | ICD-10-CM | POA: Diagnosis not present

## 2023-11-01 DIAGNOSIS — R399 Unspecified symptoms and signs involving the genitourinary system: Secondary | ICD-10-CM | POA: Diagnosis not present

## 2023-11-01 DIAGNOSIS — M47816 Spondylosis without myelopathy or radiculopathy, lumbar region: Secondary | ICD-10-CM | POA: Diagnosis not present

## 2023-11-01 LAB — URINALYSIS, COMPLETE
Bilirubin, UA: NEGATIVE
Glucose, UA: NEGATIVE
Ketones, UA: NEGATIVE
Nitrite, UA: NEGATIVE
Specific Gravity, UA: 1.025 (ref 1.005–1.030)
Urobilinogen, Ur: 0.2 mg/dL (ref 0.2–1.0)
pH, UA: 5.5 (ref 5.0–7.5)

## 2023-11-01 LAB — MICROSCOPIC EXAMINATION
RBC, Urine: NONE SEEN /[HPF] (ref 0–2)
Renal Epithel, UA: NONE SEEN /[HPF]
Yeast, UA: NONE SEEN

## 2023-11-01 MED ORDER — CEPHALEXIN 500 MG PO CAPS: 500.0000 mg | ORAL_CAPSULE | Freq: Four times a day (QID) | ORAL | 0 refills | Status: DC

## 2023-11-01 NOTE — Progress Notes (Signed)
BP (!) 163/78   Pulse 91   Ht 5\' 4"  (1.626 m)   Wt 121 lb (54.9 kg)   LMP 03/21/1971   SpO2 97%   BMI 20.77 kg/m    Subjective:   Patient ID: Emily Wagner, female    DOB: January 24, 1933, 89 y.o.   MRN: [id number]  HPI: Emily Wagner is a 89 y.o. female presenting on 11/01/2023 for Urinary Tract Infection   HPI Patient is coming in today with possible urinary symptoms.  She was seen 4 weeks ago by neurology and they did a urinalysis and suggested that maybe she had a UTI but was not treated for it initially and they wanted her to follow-up with [name] and was sent a message on 10/28/2023 that she needs to follow-up with [name].  She is coming in today and says that she has had 1 week of dysuria and urgency and odor.  She denies any abdominal pain or fevers or chills.  Patient said she had a fall just this last week and fell onto her tailbone and she feels like she broke her tailbone and it hurts to sit on it.  She is able to walk like she normally does and uses a walker to help with but she does had a slip and fall when her walker tipped sideways with her.  Relevant past medical, surgical, family and social history reviewed and updated as indicated. Interim medical history since our last visit reviewed. Allergies and medications reviewed and updated.  Review of Systems  Constitutional:  Negative for chills and fever.  Eyes:  Negative for visual disturbance.  Respiratory:  Negative for chest tightness and shortness of breath.   Cardiovascular:  Negative for chest pain and leg swelling.  Gastrointestinal:  Negative for abdominal pain.  Genitourinary:  Positive for dysuria and urgency.  Musculoskeletal:  Negative for back pain and gait problem.  Skin:  Negative for rash.  Neurological:  Negative for light-headedness and headaches.  Psychiatric/Behavioral:  Negative for agitation and behavioral problems.   All other systems reviewed and are negative.   Per HPI unless specifically  indicated above   Allergies as of 11/01/2023       Reactions   Nitrofurantoin Other (See Comments)   Liver failure   Nsaids Other (See Comments)   Kidney failure   Other Other (See Comments)   Most antibiotics require supervision per patient   Beta Adrenergic Blockers Other (See Comments)   Flonase [fluticasone Propionate] Other (See Comments)   headache   Prevnar 13 [pneumococcal 13-val Conj Vacc] Other (See Comments)   Nerve damage in left arm   Propranolol Hcl Other (See Comments)   Unknown   Hydrocodone Other (See Comments)   Alendronate Sodium Other (See Comments)   Ciprofloxacin Other (See Comments)   Patient denies allergy--"I take Cipro for bladder infections"   Codeine Other (See Comments)   Crestor [rosuvastatin Calcium] Other (See Comments)   Darvon Other (See Comments)   Lipitor [atorvastatin Calcium] Other (See Comments)   Nortriptyline Hcl Other (See Comments)   States she felt like worms where crawling under her skin.   Potassium-containing Compounds    Rash and raw areas / sore breast    Pravachol Other (See Comments)   Sulfa Antibiotics Other (See Comments)   Sulfamethoxazole Other (See Comments)   Unknown   Ultram [tramadol Hcl] Itching   Welchol [colesevelam Hcl] Other (See Comments)   Zetia [ezetimibe] Other (See Comments)   Zocor [simvastatin]  Other (See Comments)        Medication List        Accurate as of November 01, 2023  4:21 PM. If you have any questions, ask your nurse or doctor.          STOP taking these medications    traZODone 50 MG tablet Commonly known as: DESYREL Stopped by: Elige Radon Kiersten Coss       TAKE these medications    acetaminophen 325 MG tablet Commonly known as: TYLENOL Take 325 mg by mouth every 6 (six) hours as needed.   apixaban 2.5 MG Tabs tablet Commonly known as: ELIQUIS Take 1 tablet (2.5 mg total) by mouth 2 (two) times daily.   cephALEXin 500 MG capsule Commonly known as: KEFLEX Take 1  capsule (500 mg total) by mouth 4 (four) times daily. What changed: when to take this Changed by: Elige Radon Yovan Leeman   cholecalciferol 25 MCG (1000 UNIT) tablet Commonly known as: VITAMIN D3 Take 1,000 Units by mouth daily.   digoxin 0.125 MG tablet Commonly known as: LANOXIN TAKE 1 TABLET BY MOUTH DAILY   diltiazem 30 MG tablet Commonly known as: CARDIZEM TAKE 1 TABLET BY MOUTH 2 TIMES DAILY.   famotidine 20 MG tablet Commonly known as: PEPCID Take 1 tablet (20 mg total) by mouth 2 (two) times daily.   levothyroxine 50 MCG tablet Commonly known as: SYNTHROID TAKE 1 TABLET BY MOUTH DAILY BEFORE BREAKFAST   PARoxetine 20 MG tablet Commonly known as: PAXIL Take 1 tablet (20 mg total) by mouth daily.   PRESERVISION AREDS 2 PO Take 1 capsule by mouth 2 (two) times daily.   QUEtiapine 50 MG tablet Commonly known as: SEROQUEL Take 75 mg by mouth at bedtime.   VITAMIN B-12 PO Take 500 mcg by mouth. Take one tablet by mouth on Mon.,Wed., and Friday         Objective:   BP (!) 163/78   Pulse 91   Ht 5\' 4"  (1.626 m)   Wt 121 lb (54.9 kg)   LMP 03/21/1971   SpO2 97%   BMI 20.77 kg/m   Wt Readings from Last 3 Encounters:  11/01/23 121 lb (54.9 kg)  10/11/23 136 lb (61.7 kg)  09/27/23 122 lb 6.4 oz (55.5 kg)    Physical Exam Vitals and nursing note reviewed.  Constitutional:      General: She is not in acute distress.    Appearance: She is well-developed. She is not diaphoretic.  Eyes:     Conjunctiva/sclera: Conjunctivae normal.  Cardiovascular:     Rate and Rhythm: Normal rate and regular rhythm.     Heart sounds: Normal heart sounds. No murmur heard. Pulmonary:     Effort: Pulmonary effort is normal. No respiratory distress.     Breath sounds: Normal breath sounds. No wheezing.  Abdominal:     General: Abdomen is flat. Bowel sounds are normal. There is no distension.     Palpations: Abdomen is soft.     Tenderness: There is no abdominal tenderness.  There is no guarding or rebound.  Musculoskeletal:       Back:  Skin:    General: Skin is warm and dry.     Findings: No rash.  Neurological:     Mental Status: She is alert and oriented to person, place, and time.     Coordination: Coordination normal.  Psychiatric:        Behavior: Behavior normal.       Assessment &  Plan:   Problem List Items Addressed This Visit   None Visit Diagnoses     UTI symptoms    -  Primary   Relevant Medications   cephALEXin (KEFLEX) 500 MG capsule   Other Relevant Orders   Urine Culture   Urinalysis, Complete   Coccyx pain       Relevant Orders   DG Sacrum/Coccyx       Patient will do sacral x-ray on the way out and wait to be read by radiology.  Urinalysis showed 11-30 WBCs and many bacteria and 0-10 epithelial cells and 3+ leukocytes and trace blood and 1+ protein.  Will treat with cephalexin and will run culture. Follow up plan: Return if symptoms worsen or fail to improve.  Counseling provided for all of the vaccine components Orders Placed This Encounter  Procedures   Urine Culture   DG Sacrum/Coccyx   Urinalysis, Complete    Arville Care, MD [facility] 11/01/2023, 4:21 PM

## 2023-11-03 LAB — URINE CULTURE

## 2023-11-05 ENCOUNTER — Telehealth: Payer: Self-pay | Admitting: Family Medicine

## 2023-11-05 NOTE — Telephone Encounter (Signed)
Daughter has read that the thyroid medication could cause bone mass loss and increase risk of fractures. Which the pt does have a history of.

## 2023-11-05 NOTE — Telephone Encounter (Signed)
Copied from CRM [id number]. Topic: Clinical - Prescription Issue >> Nov 05, 2023  4:09 PM Mosetta Putt H wrote: Reason for CRM: needs to be called about changing thyroid medicine

## 2023-11-06 ENCOUNTER — Ambulatory Visit: Payer: Medicare Other | Admitting: Orthopaedic Surgery

## 2023-11-06 VITALS — BP 174/92 | HR 71 | Ht 64.0 in | Wt 121.0 lb

## 2023-11-06 DIAGNOSIS — B91 Sequelae of poliomyelitis: Secondary | ICD-10-CM

## 2023-11-06 DIAGNOSIS — R296 Repeated falls: Secondary | ICD-10-CM | POA: Diagnosis not present

## 2023-11-06 DIAGNOSIS — R29898 Other symptoms and signs involving the musculoskeletal system: Secondary | ICD-10-CM

## 2023-11-06 NOTE — Progress Notes (Signed)
Office Visit Note   Patient: Emily Wagner           Date of Birth: 03-Feb-1933           MRN: [id number] Visit Date: 11/06/2023              Requested by: Junie Spencer, FNP [address] [place],  [place] [address] PCP: [name], Elige Radon, MD   Assessment & Plan: Visit Diagnoses:  1. Weakness of both lower extremities   2. POST-POLIO SYNDROME   3. Falls frequently   4.     Thoracic compression fracture T3  Plan: Patient needs to use a walker.  Fall prevention discussed physical therapy recommended for strengthening evaluation assessment and core strengthening and balance.  We can follow-up in 8 weeks. X-rays, previous CT scan neck reviewed pathophysiology discussed. Follow-Up Instructions: Return in about 8 weeks (around 01/01/2024).   Orders:  Orders Placed This Encounter  Procedures   Ambulatory referral to Home Health   No orders of the defined types were placed in this encounter.     Procedures: No procedures performed   Clinical Data: No additional findings.   Subjective: Chief Complaint  Patient presents with   Neck - Pain   Middle Back - Pain   Tailbone Pain    HPI 89 year old female with history of falls has postpolio syndrome and landed on her tailbone as pain in the coccyx region some pain between the shoulder blades and had x-rays listed below that showed a T3 compression fracture.  Cervical spine and CT head imaging showed no head injury and no abnormality of the cervical spine.  Patient has been ambulatory in the community.  Additional history of coronary artery disease atrial fibrillation.  Left-sided weakness with postpolio syndrome previous hemiarthroplasty for hip fracture.  CABG x 1.  Review of Systems All systems noncontributory to HPI.  Objective: Vital Signs: BP (!) 174/92   Pulse 71   Ht 5\' 4"  (1.626 m)   Wt 121 lb (54.9 kg)   LMP 03/21/1971   BMI 20.77 kg/m   Physical Exam Constitutional:      Appearance: She is  well-developed.  HENT:     Head: Normocephalic.     Right Ear: External ear normal.     Left Ear: External ear normal. There is no impacted cerumen.  Eyes:     Pupils: Pupils are equal, round, and reactive to light.  Neck:     Thyroid: No thyromegaly.     Trachea: No tracheal deviation.  Cardiovascular:     Rate and Rhythm: Normal rate.  Pulmonary:     Effort: Pulmonary effort is normal.  Abdominal:     Palpations: Abdomen is soft.  Musculoskeletal:     Cervical back: No rigidity.  Skin:    General: Skin is warm and dry.  Neurological:     Mental Status: She is alert and oriented to person, place, and time.  Psychiatric:        Behavior: Behavior normal.     Ortho Exam upper and lower extremity reflexes are intact she has some decreased strength lower extremities.  Mild weakness of her quads hip flexors.  Some tenderness parathoracic region.  Reflexes upper extremities are intact.  Specialty Comments:  No specialty comments available.  Imaging: Narrative & Impression  CLINICAL DATA:  Fall, back pain   EXAM: THORACIC SPINE 2 VIEWS   COMPARISON:  10/23/2018   FINDINGS: Wedge-shaped compression fracture of a upper thoracic  vertebral body, approximately T3, new since 2019. No additional fractures are identified. No traumatic listhesis. Aortic atherosclerosis. Sternotomy wires.   IMPRESSION: Wedge-shaped compression fracture of a upper thoracic vertebral body, approximately T3, new since 2019.     Electronically Signed   By: Duanne Guess D.O.   On: 10/16/2023 14:37     PMFS History: Patient Active Problem List   Diagnosis Date Noted   Falls frequently 11/12/2023   LBBB (left bundle branch block) 02/03/2023   Hx of ischemic right MCA stroke 07/04/2021   Left hemiparesis (HCC) 03/27/2021   Acquired hypothyroidism 03/27/2021   Aortic atherosclerosis (HCC) 12/12/2017   Atrial fibrillation (HCC) 11/04/2015   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   Unstable  angina (HCC) 10/29/2015   CAD (coronary artery disease) 10/29/2015   Benign hypertensive heart disease without heart failure    Interstitial cystitis    S/P hip hemiarthroplasty 11/07/2012   Renal insufficiency    Adjustment disorder with mixed anxiety and depressed mood    Osteoporosis    ASCVD (arteriosclerotic cardiovascular disease)    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   HYPERCHOLESTEROLEMIA 05/02/2008   Essential hypertension 05/02/2008   DIVERTICULOSIS OF COLON 08/27/2003   Past Medical History:  Diagnosis Date   Adjustment disorder with mixed anxiety and depressed mood    Atrial fibrillation (HCC) 11/04/2015   Post-operative   Benign hypertensive heart disease without heart failure    CAD (coronary artery disease)    Infection of urinary tract 10/31/2015   Interstitial cystitis    Osteopenia    Other and unspecified hyperlipidemia    Other dyspnea and respiratory abnormality    Pelvic fracture (HCC) 05/30/2015   Post-polio syndrome    polio age 27   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   LIMA to LAD   Stroke [facility])     Family History  Problem Relation Age of Onset   CAD Mother 101       Died suddenly   CAD Father 28       Died suddenly   CAD Daughter 64   Diabetes type I Daughter        Since age 29   Heart disease Sister    Osteoporosis Sister     Past Surgical History:  Procedure Laterality Date   ABDOMINAL HYSTERECTOMY     Cancer and endometriosis   CARDIAC CATHETERIZATION  2008   CARDIAC CATHETERIZATION N/A 10/29/2015   Procedure: Left Heart Cath and Coronary Angiography;  Surgeon: Lyn Records, MD;  Location: [facility] INVASIVE CV LAB;  Service: Cardiovascular;  Laterality: N/A;   CATARACT EXTRACTION     CHOLECYSTECTOMY     CORONARY ARTERY BYPASS GRAFT N/A 11/02/2015   Procedure: OFF PUMP CORONARY ARTERY BYPASS GRAFTING (CABG) TIMES ONE;  Surgeon: Purcell Nails, MD;  Location: [facility] OR;  Service: Open Heart Surgery;  Laterality: N/A;  LIMA to LAD   FEMUR FRACTURE  SURGERY  09/2015   right hip compression screws    FRACTURE SURGERY     Right hip   HERNIA REPAIR     Right inguinal   TEE WITHOUT CARDIOVERSION N/A 11/02/2015   Procedure: TRANSESOPHAGEAL ECHOCARDIOGRAM (TEE);  Surgeon: Purcell Nails, MD;  Location: [facility] OR;  Service: Open Heart Surgery;  Laterality: N/A;   Social History   Occupational History   Occupation: retired    Associate Professor: [employer]    Comment: admin. assistant  Tobacco Use   Smoking status: Never   Smokeless tobacco: Never  Vaping Use   Vaping status: Never Used  Substance and Sexual Activity   Alcohol use: No   Drug use: No   Sexual activity: Not on file

## 2023-11-07 NOTE — Telephone Encounter (Signed)
These are side effects that can happen to bone density if it is too high, there are other problems that can happen if it is too low, this is a hormone replacement and the natural hormone that the body already makes so some major issues including bone issues and other issues will happen if we do not try and correct the levels.  Those side effects are commonly only issues if we overcorrect.  If we try and get it to a good level and that should not be an issue

## 2023-11-12 DIAGNOSIS — R296 Repeated falls: Secondary | ICD-10-CM | POA: Insufficient documentation

## 2023-11-13 NOTE — Telephone Encounter (Addendum)
Pt made aware but will have daughter call back to discuss if needed.  Sent daughter Earleen Reaper message in regards to this.

## 2023-11-14 ENCOUNTER — Telehealth: Payer: Self-pay

## 2023-11-14 NOTE — Telephone Encounter (Signed)
Copied from CRM [id number]. Topic: General - Other >> Nov 14, 2023  4:10 PM Alcus Dad H wrote: Reason for CRM: Pt's daughter Aurther Loft called back to speak with Morrie Sheldon regarding the [employer] about her mom's thyroid medication, also wanted to see how to make mom a DNR. She would like a call back, if it's morning, call her at [phone], if it's after noon, call her at [phone]

## 2023-11-14 NOTE — Telephone Encounter (Signed)
DNR form completed and signed by Dr. Louanne Skye per pt verbal wishes. Daughter will come by the office 12/19 to pick up form tomorrow.  Informed to continue thyroid med. Benefits out weigh the risks and that it is best if pt continues with med. Daughter understood.

## 2023-12-04 ENCOUNTER — Other Ambulatory Visit: Payer: Self-pay | Admitting: Neurology

## 2023-12-04 MED ORDER — ALPRAZOLAM 0.5 MG PO TABS: 0.5000 mg | ORAL_TABLET | Freq: Every evening | ORAL | 0 refills | Status: DC | PRN

## 2023-12-04 NOTE — Telephone Encounter (Signed)
 Pt's daughter, Rudi Heap requesting medication for patient  for MRI appointment on 12/09/23. Can send to [facility] [phone]

## 2023-12-04 NOTE — Telephone Encounter (Signed)
 Order will be sent to Dr Pearlean Brownie for him to send a script in to help with MRI.

## 2023-12-05 ENCOUNTER — Institutional Professional Consult (permissible substitution): Payer: Medicare Other | Admitting: Neurology

## 2023-12-09 ENCOUNTER — Other Ambulatory Visit: Payer: Medicare Other

## 2023-12-10 ENCOUNTER — Ambulatory Visit: Payer: Self-pay | Admitting: Family Medicine
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
## 2023-12-11 ENCOUNTER — Emergency Department (HOSPITAL_COMMUNITY): Payer: Medicare Other

## 2023-12-11 ENCOUNTER — Emergency Department (HOSPITAL_COMMUNITY)
Admission: EM | Admit: 2023-12-11 | Discharge: 2023-12-11 | Disposition: A | Discharge status: Home / Self Care | Payer: Medicare Other | Attending: Emergency Medicine | Admitting: Emergency Medicine

## 2023-12-11 ENCOUNTER — Ambulatory Visit: Payer: Medicare Other | Admitting: Family

## 2023-12-11 DIAGNOSIS — S2241XA Multiple fractures of ribs, right side, initial encounter for closed fracture: Secondary | ICD-10-CM | POA: Insufficient documentation

## 2023-12-11 DIAGNOSIS — W01198A Fall on same level from slipping, tripping and stumbling with subsequent striking against other object, initial encounter: Secondary | ICD-10-CM | POA: Diagnosis not present

## 2023-12-11 DIAGNOSIS — S0990XA Unspecified injury of head, initial encounter: Secondary | ICD-10-CM | POA: Diagnosis not present

## 2023-12-11 DIAGNOSIS — R0789 Other chest pain: Secondary | ICD-10-CM | POA: Insufficient documentation

## 2023-12-11 DIAGNOSIS — I700 Atherosclerosis of aorta: Secondary | ICD-10-CM | POA: Insufficient documentation

## 2023-12-11 DIAGNOSIS — I4891 Unspecified atrial fibrillation: Secondary | ICD-10-CM | POA: Insufficient documentation

## 2023-12-11 DIAGNOSIS — R1010 Upper abdominal pain, unspecified: Secondary | ICD-10-CM | POA: Diagnosis not present

## 2023-12-11 DIAGNOSIS — E785 Hyperlipidemia, unspecified: Secondary | ICD-10-CM | POA: Diagnosis not present

## 2023-12-11 DIAGNOSIS — J90 Pleural effusion, not elsewhere classified: Secondary | ICD-10-CM | POA: Insufficient documentation

## 2023-12-11 DIAGNOSIS — W06XXXA Fall from bed, initial encounter: Secondary | ICD-10-CM | POA: Diagnosis not present

## 2023-12-11 DIAGNOSIS — K5730 Diverticulosis of large intestine without perforation or abscess without bleeding: Secondary | ICD-10-CM | POA: Insufficient documentation

## 2023-12-11 DIAGNOSIS — I10 Essential (primary) hypertension: Secondary | ICD-10-CM | POA: Diagnosis not present

## 2023-12-11 DIAGNOSIS — I672 Cerebral atherosclerosis: Secondary | ICD-10-CM | POA: Diagnosis not present

## 2023-12-11 DIAGNOSIS — S199XXA Unspecified injury of neck, initial encounter: Secondary | ICD-10-CM | POA: Diagnosis not present

## 2023-12-11 DIAGNOSIS — Z8673 Personal history of transient ischemic attack (TIA), and cerebral infarction without residual deficits: Secondary | ICD-10-CM | POA: Diagnosis not present

## 2023-12-11 DIAGNOSIS — S0181XA Laceration without foreign body of other part of head, initial encounter: Secondary | ICD-10-CM | POA: Insufficient documentation

## 2023-12-11 DIAGNOSIS — W19XXXA Unspecified fall, initial encounter: Secondary | ICD-10-CM

## 2023-12-11 DIAGNOSIS — K449 Diaphragmatic hernia without obstruction or gangrene: Secondary | ICD-10-CM | POA: Insufficient documentation

## 2023-12-11 DIAGNOSIS — R29890 Loss of height: Secondary | ICD-10-CM | POA: Diagnosis not present

## 2023-12-11 DIAGNOSIS — M8008XA Age-related osteoporosis with current pathological fracture, vertebra(e), initial encounter for fracture: Secondary | ICD-10-CM | POA: Diagnosis not present

## 2023-12-11 DIAGNOSIS — S3282XA Multiple fractures of pelvis without disruption of pelvic ring, initial encounter for closed fracture: Secondary | ICD-10-CM | POA: Diagnosis not present

## 2023-12-11 DIAGNOSIS — N3090 Cystitis, unspecified without hematuria: Secondary | ICD-10-CM | POA: Insufficient documentation

## 2023-12-11 DIAGNOSIS — I2510 Atherosclerotic heart disease of native coronary artery without angina pectoris: Secondary | ICD-10-CM | POA: Diagnosis not present

## 2023-12-11 LAB — CBC
HCT: 38.5 % (ref 36.0–46.0)
Hemoglobin: 12.2 g/dL (ref 12.0–15.0)
MCH: 28.3 pg (ref 26.0–34.0)
MCHC: 31.7 g/dL (ref 30.0–36.0)
MCV: 89.3 fL (ref 80.0–100.0)
Platelets: 233 10*3/uL (ref 150–400)
RBC: 4.31 MIL/uL (ref 3.87–5.11)
RDW: 15.5 % (ref 11.5–15.5)
WBC: 11.1 10*3/uL — ABNORMAL HIGH (ref 4.0–10.5)
nRBC: 0 % (ref 0.0–0.2)

## 2023-12-11 LAB — COMPREHENSIVE METABOLIC PANEL
ALT: 13 U/L (ref 0–44)
AST: 24 U/L (ref 15–41)
Albumin: 3.5 g/dL (ref 3.5–5.0)
Alkaline Phosphatase: 96 U/L (ref 38–126)
Anion gap: 8 (ref 5–15)
BUN: 19 mg/dL (ref 8–23)
CO2: 29 mmol/L (ref 22–32)
Calcium: 9.3 mg/dL (ref 8.9–10.3)
Chloride: 103 mmol/L (ref 98–111)
Creatinine, Ser: 1 mg/dL (ref 0.44–1.00)
GFR, Estimated: 54 mL/min — ABNORMAL LOW (ref >60)
Glucose, Bld: 121 mg/dL — ABNORMAL HIGH (ref 70–99)
Potassium: 3.6 mmol/L (ref 3.5–5.1)
Sodium: 140 mmol/L (ref 135–145)
Total Bilirubin: 1.1 mg/dL (ref 0.0–1.2)
Total Protein: 7 g/dL (ref 6.5–8.1)

## 2023-12-11 LAB — I-STAT CHEM 8, ED
BUN: 22 mg/dL (ref 8–23)
Calcium, Ion: 1.13 mmol/L — ABNORMAL LOW (ref 1.15–1.40)
Chloride: 103 mmol/L (ref 98–111)
Creatinine, Ser: 0.9 mg/dL (ref 0.44–1.00)
Glucose, Bld: 123 mg/dL — ABNORMAL HIGH (ref 70–99)
HCT: 38 % (ref 36.0–46.0)
Hemoglobin: 12.9 g/dL (ref 12.0–15.0)
Potassium: 3.7 mmol/L (ref 3.5–5.1)
Sodium: 143 mmol/L (ref 135–145)
TCO2: 30 mmol/L (ref 22–32)

## 2023-12-11 LAB — URINALYSIS, ROUTINE W REFLEX MICROSCOPIC
Bilirubin Urine: NEGATIVE
Glucose, UA: NEGATIVE mg/dL
Hgb urine dipstick: NEGATIVE
Ketones, ur: NEGATIVE mg/dL
Nitrite: NEGATIVE
Protein, ur: 100 mg/dL — AB
Specific Gravity, Urine: 1.024 (ref 1.005–1.030)
pH: 5 (ref 5.0–8.0)

## 2023-12-11 LAB — MAGNESIUM: Magnesium: 1.9 mg/dL (ref 1.7–2.4)

## 2023-12-11 LAB — I-STAT CG4 LACTIC ACID, ED: Lactic Acid, Venous: 1.8 mmol/L (ref 0.5–1.9)

## 2023-12-11 LAB — CK: Total CK: 75 U/L (ref 38–234)

## 2023-12-11 LAB — ETHANOL: Alcohol, Ethyl (B): <10 mg/dL (ref <10)

## 2023-12-11 MED ORDER — SODIUM CHLORIDE 0.9 % IV SOLN
1.0000 g | Freq: Once | INTRAVENOUS | Status: AC
  Administered 2023-12-11: 1 g via INTRAVENOUS
  Filled 2023-12-11: qty 10

## 2023-12-11 MED ORDER — CEPHALEXIN 500 MG PO CAPS: 500.0000 mg | ORAL_CAPSULE | Freq: Four times a day (QID) | ORAL | 0 refills | Status: AC

## 2023-12-11 MED ORDER — LIDOCAINE 5 % EX PTCH
1.0000 | MEDICATED_PATCH | CUTANEOUS | Status: DC
  Administered 2023-12-11: 1 via TRANSDERMAL
  Filled 2023-12-11: qty 1

## 2023-12-11 MED ORDER — ACETAMINOPHEN 325 MG PO TABS
650.0000 mg | ORAL_TABLET | Freq: Once | ORAL | Status: AC
  Administered 2023-12-11: 650 mg via ORAL
  Filled 2023-12-11: qty 2

## 2023-12-11 MED ORDER — METHOCARBAMOL 500 MG PO TABS: 500.0000 mg | ORAL_TABLET | Freq: Two times a day (BID) | ORAL | 0 refills | Status: DC | PRN

## 2023-12-11 MED ORDER — LIDOCAINE 5 % EX PTCH: 1.0000 | MEDICATED_PATCH | CUTANEOUS | 0 refills | Status: DC

## 2023-12-11 MED ORDER — SODIUM CHLORIDE 0.9 % IV BOLUS
500.0000 mL | Freq: Once | INTRAVENOUS | Status: AC
  Administered 2023-12-11: 500 mL via INTRAVENOUS

## 2023-12-11 MED ORDER — METHOCARBAMOL 500 MG PO TABS
500.0000 mg | ORAL_TABLET | Freq: Once | ORAL | Status: AC
  Administered 2023-12-11: 500 mg via ORAL
  Filled 2023-12-11: qty 1

## 2023-12-11 NOTE — ED Provider Notes (Signed)
 Patient signed out to me by previous provider. Please refer to their note for full HPI.  Briefly this is an 89 year old female who presented status post fall.  Found to have some traumatic findings on imaging as well as a UTI.  Admission offered however patient declined.  Plan for IV medication/treatment reevaluation.  IV therapy has completed, patient has no new acute complaints/changes, previous plan of discharge with family support stands.  Patient at this time appears safe and stable for discharge and close outpatient follow up. Discharge plan and strict return to ED precautions discussed, patient verbalizes understanding and agreement.   Bari Roxie HERO, DO 12/11/23 2027

## 2023-12-11 NOTE — Discharge Instructions (Signed)
 Continue Tylenol  for pain and soreness.  Prescriptions for lidocaine  patches and a muscle relaxer were sent to your pharmacy.  Lidocaine  patches can be replaced daily.  Take muscle relaxer as needed.  A prescription for Keflex  was also sent to your pharmacy.  This is for treatment of UTI.  You did receive your first dose of antibiotics in the emergency department which will cover you until tomorrow.  Follow-up on results of urine cultures, which should be available in 1 to 2 days.  Return to the emergency department for any new or worsening symptoms of concern.

## 2023-12-11 NOTE — ED Provider Notes (Signed)
 [place] [facility] AT [facility] Provider Note   CSN: [id number] Arrival date & time: 12/11/23  1336     History  Chief Complaint  Patient presents with   Emily Wagner Emily Wagner is a 89 y.o. female.  HPI Patient presents for fall.  Medical history includes HTN, HLD, osteoporosis, CAD, atrial fibrillation, CVA.  No medications include Eliquis , digoxin .  She had a fall 3 days ago, during which she hit a chair and then the carpeted floor.  She has since had pain in her lateral right lower ribs.  She had a area of swelling on her right buttock which has improved.  Plan was to go to her PCP today.  Her daughter last saw her well last night.  When her daughter came to pick her up to go to the doctor's office today, she found her on the floor.  Patient states that she fell around 10 PM last night.  When EMS arrived on scene, patient was laying on carpeted floor.  She was lifted onto stretcher.  They did not assess standing or walking.  Patient was noted to have confusion during transit.  Vital signs were notable for atrial fibrillation with normal heart rate, hypertension.  CBG was normal.  Currently, patient endorses pain all over.  She endorses ongoing pain in her right lateral ribs.    Home Medications Prior to Admission medications   Medication Sig Start Date End Date Taking? Authorizing Provider  cephALEXin  (KEFLEX ) 500 MG capsule Take 1 capsule (500 mg total) by mouth 4 (four) times daily for 5 days. 12/11/23 12/16/23 Yes Melvenia Motto, MD  lidocaine  (LIDODERM ) 5 % Place 1 patch onto the skin daily. Remove & Discard patch within 12 hours or as directed by MD 12/11/23  Yes Melvenia Motto, MD  methocarbamol  (ROBAXIN ) 500 MG tablet Take 1 tablet (500 mg total) by mouth every 12 (twelve) hours as needed for up to 12 doses for muscle spasms. 12/11/23  Yes Melvenia Motto, MD  acetaminophen  (TYLENOL ) 325 MG tablet Take 325 mg by mouth every 6 (six) hours as needed.    [provider]  ALPRAZolam  (XANAX ) 0.5 MG tablet Take 1 tablet (0.5 mg total) by mouth at bedtime as needed for anxiety (MRI). Pt may take 1 tablet 30 min prior to MRI and then repeat if needed at the time of the MRI. (MUST have a driver) 06/28/73   Rosemarie Eather RAMAN, MD  apixaban  (ELIQUIS ) 2.5 MG TABS tablet Take 1 tablet (2.5 mg total) by mouth 2 (two) times daily. 10/13/22   Lavona Agent, MD  cholecalciferol  (VITAMIN D3) 25 MCG (1000 UNIT) tablet Take 1,000 Units by mouth daily.    [provider]  Cyanocobalamin  (VITAMIN B-12 PO) Take 500 mcg by mouth. Take one tablet by mouth on Mon.,Wed., and Friday    [provider]  digoxin  (LANOXIN ) 0.125 MG tablet TAKE 1 TABLET BY MOUTH DAILY 08/02/23   Lavona Agent, MD  diltiazem  (CARDIZEM ) 30 MG tablet TAKE 1 TABLET BY MOUTH 2 TIMES DAILY. 11/21/22   Lavona Agent, MD  famotidine  (PEPCID ) 20 MG tablet Take 1 tablet (20 mg total) by mouth 2 (two) times daily. 01/01/23   [name], Fonda LABOR, MD  levothyroxine  (SYNTHROID ) 50 MCG tablet TAKE 1 TABLET BY MOUTH DAILY BEFORE BREAKFAST 09/03/23   [name], Fonda LABOR, MD  Multiple Vitamins-Minerals (PRESERVISION AREDS 2 PO) Take 1 capsule by mouth 2 (two) times daily.    [provider]  PARoxetine  (PAXIL ) 20 MG tablet Take 1 tablet (20 mg total) by mouth daily. 01/01/23   [name], Fonda LABOR, MD  QUEtiapine  (SEROQUEL ) 50 MG tablet Take 75 mg by mouth at bedtime.    [provider]      Allergies    Nitrofurantoin, Nsaids, Other, Beta adrenergic blockers, Flonase [fluticasone propionate], Prevnar 13 [pneumococcal 13-val conj vacc], Propranolol hcl, Hydrocodone, Alendronate sodium, Ciprofloxacin , Codeine, Crestor [rosuvastatin calcium ], Darvon, Lipitor [atorvastatin  calcium ], Nortriptyline  hcl, Potassium-containing compounds, Pravachol, Sulfa antibiotics, Sulfamethoxazole, Ultram  [tramadol  hcl], Welchol [colesevelam hcl], Zetia [ezetimibe], and Zocor [simvastatin]    Review of  Systems   Review of Systems  Cardiovascular:  Positive for chest pain.  Musculoskeletal:  Positive for myalgias.  Skin:  Positive for wound.  Psychiatric/Behavioral:  Positive for confusion.   All other systems reviewed and are negative.   Physical Exam Updated Vital Signs BP (!) 162/90   Pulse 90   Temp 97.7 F (36.5 C) (Oral)   Resp 17   Ht 5' 4 (1.626 m)   Wt 55.8 kg   LMP 03/21/1971   SpO2 98%   BMI 21.11 kg/m  Physical Exam Vitals and nursing note reviewed.  Constitutional:      General: She is not in acute distress.    Appearance: She is well-developed and normal weight. She is not toxic-appearing or diaphoretic.  HENT:     Head: Normocephalic.     Comments: Hemostatic laceration to right upper forehead.  There is dried crusted blood present.    Right Ear: External ear normal.     Left Ear: External ear normal.     Nose: Nose normal.     Mouth/Throat:     Mouth: Mucous membranes are moist.  Eyes:     Extraocular Movements: Extraocular movements intact.     Conjunctiva/sclera: Conjunctivae normal.  Neck:     Comments: Cervical collar in place Cardiovascular:     Rate and Rhythm: Normal rate and regular rhythm.     Heart sounds: No murmur heard. Pulmonary:     Effort: Pulmonary effort is normal. No respiratory distress.     Breath sounds: Normal breath sounds. No wheezing or rales.  Chest:     Chest wall: Tenderness present.  Abdominal:     General: There is no distension.     Palpations: Abdomen is soft.     Tenderness: There is no abdominal tenderness.  Musculoskeletal:        General: No swelling or deformity. Normal range of motion.     Cervical back: Neck supple.     Right lower leg: No edema.     Left lower leg: No edema.  Skin:    General: Skin is warm and dry.     Coloration: Skin is not jaundiced or pale.  Neurological:     General: No focal deficit present.     Mental Status: She is alert. She is disoriented.  Psychiatric:        Mood and  Affect: Mood normal.        Behavior: Behavior normal.     ED Results / Procedures / Treatments   Labs (all labs ordered are listed, but only abnormal results are displayed) Labs Reviewed  COMPREHENSIVE METABOLIC PANEL - Abnormal; Notable for the following components:      Result Value   Glucose, Bld 121 (*)    GFR, Estimated 54 (*)    All other components within normal limits  CBC - Abnormal; Notable for  the following components:   WBC 11.1 (*)    All other components within normal limits  URINALYSIS, ROUTINE W REFLEX MICROSCOPIC - Abnormal; Notable for the following components:   APPearance HAZY (*)    Protein, ur 100 (*)    Leukocytes,Ua LARGE (*)    Bacteria, UA RARE (*)    All other components within normal limits  I-STAT CHEM 8, ED - Abnormal; Notable for the following components:   Glucose, Bld 123 (*)    Calcium , Ion 1.13 (*)    All other components within normal limits  URINE CULTURE  ETHANOL  MAGNESIUM   CK  I-STAT CG4 LACTIC ACID, ED    EKG EKG Interpretation Date/Time:  Tuesday December 11 2023 14:11:43 EST Ventricular Rate:  92 PR Interval:    QRS Duration:  121 QT Interval:  364 QTC Calculation: 451 R Axis:   38  Text Interpretation: Atrial fibrillation Left bundle branch block Confirmed by Melvenia Motto [phone]) on 12/11/2023 4:10:28 PM  Radiology CT CHEST ABDOMEN PELVIS WO CONTRAST Result Date: 12/11/2023 CLINICAL DATA:  89 year old post blunt trauma. EXAM: CT CHEST, ABDOMEN AND PELVIS WITHOUT CONTRAST TECHNIQUE: Multidetector CT imaging of the chest, abdomen and pelvis was performed following the standard protocol without IV contrast. RADIATION DOSE REDUCTION: This exam was performed according to the departmental dose-optimization program which includes automated exposure control, adjustment of the mA and/or kV according to patient size and/or use of iterative reconstruction technique. COMPARISON:  None Available. FINDINGS: CT CHEST FINDINGS Cardiovascular:  Lack of IV contrast limits assessment for vascular injury. Aortic atherosclerosis and tortuosity. There is no perivascular stranding to suggest injury. The heart is mildly enlarged. There are coronary artery calcifications. No pericardial effusion. Mediastinum/Nodes: No mediastinal hemorrhage or hematoma. No pneumomediastinum. Patulous esophagus. No mediastinal adenopathy. Lungs/Pleura: No pneumothorax. Trace right pleural effusion. Patchy ground-glass in the dependent right lower lobe is favored to represent atelectasis, motion artifact obscured. No dominant pulmonary mass. Musculoskeletal: Nondisplaced fractures of right anterior fifth and sixth ribs. Mild T1 superior endplate compression fracture with approximately 15% loss of height is age indeterminate. Moderate T3 compression fracture with 40% loss of height is age indeterminate. Prior median sternotomy. No confluent chest wall contusion. CT ABDOMEN PELVIS FINDINGS Noncontrast CT imaging in the setting of acute trauma significantly limits the detection of subtle though potentially significant traumatic findings. Hepatobiliary: Allowing for lack of contrast and motion, there is no evidence of hepatic injury. 7 mm subcapsular hypodensity in the right lobe of the liver is too small to characterize series 3, image 53, typically cyst. Cholecystectomy without biliary dilatation. Pancreas: No evidence of injury. Parenchymal atrophy. No ductal dilatation or inflammation. Spleen: Homogeneous attenuation without evidence of injury. Adrenals/Urinary Tract: No adrenal hemorrhage. There is no evidence of renal injury on this unenhanced exam. There are bilateral parapelvic cysts. No further follow-up imaging is recommended. Bilateral renal parenchymal thinning. No hydronephrosis. Unremarkable urinary bladder. Stomach/Bowel: No evidence of bowel injury or inflammation. Colonic diverticulosis, prominent in the sigmoid. No active diverticulitis. Small hiatal hernia.  Vascular/Lymphatic: Aortic atherosclerosis. No periaortic stranding or retroperitoneal fluid to suggest injury. No bulky adenopathy. Reproductive: Status post hysterectomy. No adnexal masses. Other: No free air or ascites. Misty mesentery appearance, often chronic. There is generalized stranding of the right gluteal subcutaneous fat. And ovoid hyperdense structure in the region of stranding measuring 5.2 x 2 cm series 3, image 90 may represent hematoma. Additional adjacent smaller hyperdensities. Musculoskeletal: Postsurgical change of the right proximal femur. Remote right pubic rami fractures.  Remote right sacral fracture. No evidence of acute fracture of the pelvis. Scoliosis and moderate diffuse degenerative change in the lumbar spine. No acute lumbar fracture. IMPRESSION: 1. Nondisplaced right anterior fifth and sixth rib fractures. Minimal right pleural effusion but no pneumothorax. Dependent ground-glass in the right lower lobe is favored to be atelectasis, pulmonary contusion is felt less likely. This is motion degraded. 2. Mild T1 and moderate T3 compression fractures are age indeterminate, but new from 06/08/2023. 3. Stranding in the right gluteal subcutaneous tissues likely represents contusion. Probable soft tissue hematomas, largest measuring 5.2 x 2 cm. 4. Remote right pelvic fractures. 5. No evidence of intra-abdominopelvic injury on this unenhanced exam. 6. Colonic diverticulosis without diverticulitis. Aortic Atherosclerosis (ICD10-I70.0). Electronically Signed   By: Andrea Gasman M.D.   On: 12/11/2023 15:38   CT HEAD WO CONTRAST Result Date: 12/11/2023 CLINICAL DATA:  Moderate to severe head and neck trauma EXAM: CT HEAD WITHOUT CONTRAST CT CERVICAL SPINE WITHOUT CONTRAST TECHNIQUE: Multidetector CT imaging of the head and cervical spine was performed following the standard protocol without intravenous contrast. Multiplanar CT image reconstructions of the cervical spine were also generated.  RADIATION DOSE REDUCTION: This exam was performed according to the departmental dose-optimization program which includes automated exposure control, adjustment of the mA and/or kV according to patient size and/or use of iterative reconstruction technique. COMPARISON:  06/08/2023 CT head and cervical spine FINDINGS: CT HEAD FINDINGS Brain: No evidence of acute infarct, hemorrhage, mass, mass effect, or midline shift. No hydrocephalus or extra-axial fluid collection. Redemonstrated right frontotemporal encephalomalacia. Periventricular white matter changes, likely the sequela of chronic small vessel ischemic disease. Vascular: No hyperdense vessel. Atherosclerotic calcifications in the intracranial carotid and vertebral arteries. Skull: Negative for fracture or focal lesion. Sinuses/Orbits: Mucosal thickening in the ethmoid air cells. Mucous retention, left sphenoid sinus, and left maxillary sinus. Status post bilateral lens replacements. Other: The mastoid air cells are well aerated. CT CERVICAL SPINE FINDINGS Alignment: No traumatic listhesis. Skull base and vertebrae: No acute fracture or suspicious osseous lesion in the cervical spine. Slight vertebral body height loss at the superior aspect of T1, which is new compared to 06/08/2023 but favored to be chronic, possibly related to a fall reported in October of 2024. Soft tissues and spinal canal: No prevertebral fluid or swelling. No visible canal hematoma. Disc levels: Degenerative changes in the cervical spine.No high-grade spinal canal stenosis. Upper chest: For findings in the thorax, please see same day CT chest. IMPRESSION: 1. No acute intracranial process. 2. No acute fracture or traumatic listhesis in the cervical spine. 3. Slight vertebral body height loss at the superior aspect of T1, which is new compared to 06/08/2023 but favored to be chronic, possibly related to a fall reported in October of 2024. Correlate with point tenderness. Electronically Signed    By: Donald Campion M.D.   On: 12/11/2023 15:32   CT CERVICAL SPINE WO CONTRAST Result Date: 12/11/2023 CLINICAL DATA:  Moderate to severe head and neck trauma EXAM: CT HEAD WITHOUT CONTRAST CT CERVICAL SPINE WITHOUT CONTRAST TECHNIQUE: Multidetector CT imaging of the head and cervical spine was performed following the standard protocol without intravenous contrast. Multiplanar CT image reconstructions of the cervical spine were also generated. RADIATION DOSE REDUCTION: This exam was performed according to the departmental dose-optimization program which includes automated exposure control, adjustment of the mA and/or kV according to patient size and/or use of iterative reconstruction technique. COMPARISON:  06/08/2023 CT head and cervical spine FINDINGS: CT HEAD FINDINGS Brain:  No evidence of acute infarct, hemorrhage, mass, mass effect, or midline shift. No hydrocephalus or extra-axial fluid collection. Redemonstrated right frontotemporal encephalomalacia. Periventricular white matter changes, likely the sequela of chronic small vessel ischemic disease. Vascular: No hyperdense vessel. Atherosclerotic calcifications in the intracranial carotid and vertebral arteries. Skull: Negative for fracture or focal lesion. Sinuses/Orbits: Mucosal thickening in the ethmoid air cells. Mucous retention, left sphenoid sinus, and left maxillary sinus. Status post bilateral lens replacements. Other: The mastoid air cells are well aerated. CT CERVICAL SPINE FINDINGS Alignment: No traumatic listhesis. Skull base and vertebrae: No acute fracture or suspicious osseous lesion in the cervical spine. Slight vertebral body height loss at the superior aspect of T1, which is new compared to 06/08/2023 but favored to be chronic, possibly related to a fall reported in October of 2024. Soft tissues and spinal canal: No prevertebral fluid or swelling. No visible canal hematoma. Disc levels: Degenerative changes in the cervical spine.No  high-grade spinal canal stenosis. Upper chest: For findings in the thorax, please see same day CT chest. IMPRESSION: 1. No acute intracranial process. 2. No acute fracture or traumatic listhesis in the cervical spine. 3. Slight vertebral body height loss at the superior aspect of T1, which is new compared to 06/08/2023 but favored to be chronic, possibly related to a fall reported in October of 2024. Correlate with point tenderness. Electronically Signed   By: Donald Campion M.D.   On: 12/11/2023 15:32    Procedures Procedures    Medications Ordered in ED Medications  cefTRIAXone  (ROCEPHIN ) 1 g in sodium chloride  0.9 % 100 mL IVPB (has no administration in time range)  lidocaine  (LIDODERM ) 5 % 1 patch (has no administration in time range)  sodium chloride  0.9 % bolus 500 mL (has no administration in time range)  methocarbamol  (ROBAXIN ) tablet 500 mg (has no administration in time range)  acetaminophen  (TYLENOL ) tablet 650 mg (650 mg Oral Given 12/11/23 1628)    ED Course/ Medical Decision Making/ A&P                                 Medical Decision Making Amount and/or Complexity of Data Reviewed Labs: ordered. Radiology: ordered.  Risk OTC drugs. Prescription drug management.   This patient presents to the ED for concern of fall, this involves an extensive number of treatment options, and is a complaint that carries with it a high risk of complications and morbidity.  The differential diagnosis includes acute injuries   Co morbidities that complicate the patient evaluation  HTN, HLD, osteoporosis, CAD, atrial fibrillation, CVA   Additional history obtained:  Additional history obtained from EMS, patient's daughter External records from outside source obtained and reviewed including EMR   Lab Tests:  I Ordered, and personally interpreted labs.  The pertinent results include: Normal hemoglobin, mild leukocytosis, normal aphasia, normal electrolytes.  Urinalysis concerning  for UTI   Imaging Studies ordered:  I ordered imaging studies including CT of head, cervical spine, chest, abdomen, pelvis I independently visualized and interpreted imaging which showed right anterior rib fractures 5-6; small right pleural effusion with dependent groundglass in RLL, favored to be atelectasis; age-indeterminate T3 and T1 fractures; stranding in right's gluteal tissue consistent with contusion. I agree with the radiologist interpretation   Cardiac Monitoring: / EKG:  The patient was maintained on a cardiac monitor.  I personally viewed and interpreted the cardiac monitored which showed an underlying rhythm of: Sinus rhythm  Problem List / ED Course / Critical interventions / Medication management  Patient presenting after fall.  Estimated time of fall was 10 PM last night.  She remained on the floor throughout the day until her daughter arrived at noon.  She arrives via EMS.  On arrival, patient is awake and alert.  Although she seems mildly confused, EMS states that her mentation has improved during transit.  Patient endorses pain all over, particularly in right lower lateral ribs, where she sustained an injury 3 days ago from a separate fall.  She declines analgesia other than Tylenol .  Tylenol  was ordered.  Per chart review, tetanus was last updated 1 year ago.  Workup was initiated.  Serum lab work was unremarkable.  Urinalysis shows concern of infection.  Ceftriaxone  was ordered for empiric treatment of UTI.  On imaging studies, patient does have 2 rib fractures in her area of greatest pain, right lateral chest wall.  Multimodal pain control was ordered.  She has age-indeterminate T1 and T3 compression fractures, however, she has no pain or tenderness to these areas.  Patient was informed of results.  At this time, her daughter is at bedside.  Patient request to go home.  Daughter is okay with this.  Patient lives alone and has her children living very close by.  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  Patient was prescribed ongoing antibiotics and ongoing multimodal pain control.  I attempted to remove the dried blood from her forehead wound with saline and hydrogen peroxide.  Patient refused any further evaluation of the wound.  It does appear that it is approximated with the dried blood in place.  Following IV fluids and antibiotic, she will be stable for discharge. I ordered medication including IV fluids for hydration; ceftriaxone  for UTI; Tylenol , Robaxin , lidocaine  patch for analgesia Reevaluation of the patient after these medicines showed that the patient improved I have reviewed the patients home medicines and have made adjustments as needed   Social Determinants of Health:  Lives at home, has children very close by         Final Clinical Impression(s) / ED Diagnoses Final diagnoses:  Fall, initial encounter  Cystitis    Rx / DC Orders ED Discharge Orders          Ordered    cephALEXin  (KEFLEX ) 500 MG capsule  4 times daily        12/11/23 1635    methocarbamol  (ROBAXIN ) 500 MG tablet  Every 12 hours PRN        12/11/23 1639    lidocaine  (LIDODERM ) 5 %  Every 24 hours        12/11/23 1639              Melvenia Motto, MD 12/11/23 1655

## 2023-12-11 NOTE — ED Notes (Signed)
 CT scan delayed. No scanner available at this time due to other codes in the department.

## 2023-12-11 NOTE — Progress Notes (Signed)
 Orthopedic Tech Progress Note Patient Details:  Emily Wagner 1933-09-28 [id number]  Level 2 trauma   Patient ID: Emily Wagner, female   DOB: November 21, 1933, 89 y.o.   MRN: [id number]  Donald Pore 12/11/2023, 2:54 PM

## 2023-12-11 NOTE — ED Notes (Signed)
RN is aware of high B/P 

## 2023-12-11 NOTE — ED Triage Notes (Signed)
 Pt BIB [facility] for a fall. EMS report that pt thinks she fell at 10pm last night. Daughter found pt this AM in the floor. Unknown if pt hit head during fall. Pt with c/o pain to R flank and EMS noted dried blood on forehead. Pt is A/O at her baseline, but has hx of dementia. Pt with hx of falls with worsening frequency. Pt is on blood thinners.   EMS Vitals   CBG 146 HR 113 SpO2 94% on R/A 160/100

## 2023-12-11 NOTE — ED Triage Notes (Signed)
 Level 2 trauma actived.

## 2023-12-12 ENCOUNTER — Telehealth: Payer: Self-pay

## 2023-12-12 LAB — URINE CULTURE

## 2023-12-12 NOTE — Transitions of Care (Post Inpatient/ED Visit) (Signed)
   12/12/2023  Name: Emily Wagner MRN: [id number] DOB: Nov 23, 1933  Today's TOC FU Call Status: Today's TOC FU Call Status:: Unsuccessful Call (1st Attempt) Unsuccessful Call (1st Attempt) Date: 12/12/23  Attempted to reach the patient regarding the most recent Inpatient/ED visit.  Follow Up Plan: Additional outreach attempts will be made to reach the patient to complete the Transitions of Care (Post Inpatient/ED visit) call.   Signature Karena Addison, LPN [facility] Nurse Health Advisor Direct Dial [phone]

## 2023-12-14 ENCOUNTER — Telehealth: Payer: Self-pay

## 2023-12-14 NOTE — Telephone Encounter (Signed)
Copied from CRM [id number]. Topic: Clinical - Home Health Verbal Orders >> Dec 14, 2023 11:58 AM Halina Maidens L wrote: Caller/Agency: [facility] Callback Number: [phone] Tori Service Requested: Occupational Therapy physical therapy and CNA Frequency: whatever is recommedned Any new concerns about the patient? Yes

## 2023-12-14 NOTE — Telephone Encounter (Signed)
Ap0pt sched for Mon 1/20 for [facility] orders, pt has had recent falls

## 2023-12-17 ENCOUNTER — Ambulatory Visit (INDEPENDENT_AMBULATORY_CARE_PROVIDER_SITE_OTHER): Payer: Medicare Other | Admitting: Family Medicine

## 2023-12-17 ENCOUNTER — Encounter: Payer: Self-pay | Admitting: Family Medicine

## 2023-12-17 VITALS — BP 137/85 | HR 93 | Ht 64.0 in | Wt 117.0 lb

## 2023-12-17 DIAGNOSIS — R410 Disorientation, unspecified: Secondary | ICD-10-CM | POA: Diagnosis not present

## 2023-12-17 DIAGNOSIS — R296 Repeated falls: Secondary | ICD-10-CM

## 2023-12-17 DIAGNOSIS — R5381 Other malaise: Secondary | ICD-10-CM | POA: Diagnosis not present

## 2023-12-17 DIAGNOSIS — S2241XD Multiple fractures of ribs, right side, subsequent encounter for fracture with routine healing: Secondary | ICD-10-CM

## 2023-12-17 DIAGNOSIS — I10 Essential (primary) hypertension: Secondary | ICD-10-CM | POA: Diagnosis not present

## 2023-12-17 MED ORDER — LEVOTHYROXINE SODIUM 50 MCG PO TABS: 50.0000 ug | ORAL_TABLET | Freq: Every day | ORAL | 1 refills | Status: DC

## 2023-12-17 NOTE — Progress Notes (Signed)
BP 137/85   Pulse 93   Ht 5\' 4"  (1.626 m)   Wt 117 lb (53.1 kg)   LMP 03/21/1971   SpO2 96%   BMI 20.08 kg/m    Subjective:   Patient ID: Emily Wagner, female    DOB: 05/24/1933, 89 y.o.   MRN: [id number]  HPI: Emily Wagner is a 89 y.o. female presenting on 12/17/2023 for Extremity Weakness, Altered Mental Status, and Fall   HPI Patient is coming in today with complaints of recurrent falls and mental confusion and weakness.  Her daughter brings her in today and says she was recently treated for UTI but her mental confusion has continued to persist and possibly even increased.  Daughter says it has been gradually increasing over the past couple months and they were concerned that she possibly had another stroke and she went and saw her neurologist and she does have an MRI scheduled for this week.  During one of her falls she fractured 2 of her ribs on the right side and also has some bruising on her hip on the right side.  Relevant past medical, surgical, family and social history reviewed and updated as indicated. Interim medical history since our last visit reviewed. Allergies and medications reviewed and updated.  Review of Systems  Constitutional:  Negative for chills and fever.  Eyes:  Negative for redness and visual disturbance.  Respiratory:  Negative for chest tightness and shortness of breath.   Cardiovascular:  Positive for chest pain. Negative for leg swelling.  Genitourinary:  Positive for frequency. Negative for dysuria.  Musculoskeletal:  Positive for arthralgias and gait problem. Negative for back pain.  Skin:  Negative for rash.  Neurological:  Positive for weakness. Negative for light-headedness and headaches.  Psychiatric/Behavioral:  Positive for agitation and confusion. Negative for behavioral problems.   All other systems reviewed and are negative.   Per HPI unless specifically indicated above   Allergies as of 12/17/2023       Reactions    Nitrofurantoin Other (See Comments)   Liver failure   Nsaids Other (See Comments)   Kidney failure   Other Other (See Comments)   Most antibiotics require supervision per patient   Beta Adrenergic Blockers Other (See Comments)   Flonase [fluticasone Propionate] Other (See Comments)   headache   Prevnar 13 [pneumococcal 13-val Conj Vacc] Other (See Comments)   Nerve damage in left arm   Propranolol Hcl Other (See Comments)   Unknown   Hydrocodone Other (See Comments)   Alendronate Sodium Other (See Comments)   Ciprofloxacin Other (See Comments)   Patient denies allergy--"I take Cipro for bladder infections"   Codeine Other (See Comments)   Crestor [rosuvastatin Calcium] Other (See Comments)   Darvon Other (See Comments)   Lipitor [atorvastatin Calcium] Other (See Comments)   Nortriptyline Hcl Other (See Comments)   States she felt like worms where crawling under her skin.   Potassium-containing Compounds    Rash and raw areas / sore breast    Pravachol Other (See Comments)   Sulfa Antibiotics Other (See Comments)   Sulfamethoxazole Other (See Comments)   Unknown   Ultram [tramadol Hcl] Itching   Welchol [colesevelam Hcl] Other (See Comments)   Zetia [ezetimibe] Other (See Comments)   Zocor [simvastatin] Other (See Comments)        Medication List        Accurate as of December 17, 2023 12:21 PM. If you have any questions, ask your  nurse or doctor.          acetaminophen 325 MG tablet Commonly known as: TYLENOL Take 325 mg by mouth every 6 (six) hours as needed.   ALPRAZolam 0.5 MG tablet Commonly known as: XANAX Take 1 tablet (0.5 mg total) by mouth at bedtime as needed for anxiety (MRI). Pt may take 1 tablet 30 min prior to MRI and then repeat if needed at the time of the MRI. (MUST have a driver)   apixaban 2.5 MG Tabs tablet Commonly known as: ELIQUIS Take 1 tablet (2.5 mg total) by mouth 2 (two) times daily.   cholecalciferol 25 MCG (1000 UNIT)  tablet Commonly known as: VITAMIN D3 Take 1,000 Units by mouth daily.   digoxin 0.125 MG tablet Commonly known as: LANOXIN TAKE 1 TABLET BY MOUTH DAILY   diltiazem 30 MG tablet Commonly known as: CARDIZEM TAKE 1 TABLET BY MOUTH 2 TIMES DAILY.   famotidine 20 MG tablet Commonly known as: PEPCID Take 1 tablet (20 mg total) by mouth 2 (two) times daily.   levothyroxine 50 MCG tablet Commonly known as: SYNTHROID Take 1 tablet (50 mcg total) by mouth daily before breakfast.   lidocaine 5 % Commonly known as: Lidoderm Place 1 patch onto the skin daily. Remove & Discard patch within 12 hours or as directed by MD   methocarbamol 500 MG tablet Commonly known as: ROBAXIN Take 1 tablet (500 mg total) by mouth every 12 (twelve) hours as needed for up to 12 doses for muscle spasms.   PARoxetine 20 MG tablet Commonly known as: PAXIL Take 1 tablet (20 mg total) by mouth daily.   PRESERVISION AREDS 2 PO Take 1 capsule by mouth 2 (two) times daily.   QUEtiapine 50 MG tablet Commonly known as: SEROQUEL Take 75 mg by mouth at bedtime.   VITAMIN B-12 PO Take 500 mcg by mouth. Take one tablet by mouth on Mon.,Wed., and Friday         Objective:   BP 137/85   Pulse 93   Ht 5\' 4"  (1.626 m)   Wt 117 lb (53.1 kg)   LMP 03/21/1971   SpO2 96%   BMI 20.08 kg/m   Wt Readings from Last 3 Encounters:  12/17/23 117 lb (53.1 kg)  12/11/23 123 lb (55.8 kg)  11/06/23 121 lb (54.9 kg)    Physical Exam Vitals and nursing note reviewed.  Constitutional:      General: She is not in acute distress.    Appearance: She is well-developed. She is not diaphoretic.  Eyes:     Conjunctiva/sclera: Conjunctivae normal.  Cardiovascular:     Rate and Rhythm: Normal rate and regular rhythm.     Heart sounds: Normal heart sounds. No murmur heard. Pulmonary:     Effort: Pulmonary effort is normal. No respiratory distress.     Breath sounds: Normal breath sounds. No wheezing.  Chest:     Chest  wall: Tenderness (Right chest wall tenderness and bruising on right chest wall and hip) present.  Skin:    General: Skin is warm and dry.     Findings: No rash.  Neurological:     Mental Status: She is alert and oriented to person, place, and time.     Coordination: Coordination normal.  Psychiatric:        Behavior: Behavior normal.        Cognition and Memory: She exhibits impaired recent memory.       Assessment & Plan:   Problem  List Items Addressed This Visit       Cardiovascular and Mediastinum   Essential hypertension   Relevant Medications   levothyroxine (SYNTHROID) 50 MCG tablet   Other Relevant Orders   Ambulatory referral to Home Health   Other Visit Diagnoses       Recurrent falls    -  Primary   Relevant Orders   Ambulatory referral to Home Health     Closed fracture of two ribs of right side with routine healing, subsequent encounter       Relevant Orders   Ambulatory referral to Home Health     Physical deconditioning       Relevant Orders   Ambulatory referral to Home Health     Mental confusion       Relevant Orders   Ambulatory referral to Home Health       Will do home health referral.  She has an MRI scheduled for this week.  She needs strengthening to help prevent falls. Follow up plan: Return if symptoms worsen or fail to improve.  Counseling provided for all of the vaccine components Orders Placed This Encounter  Procedures   Ambulatory referral to Home Health    Arville Care, MD [facility] 12/17/2023, 12:21 PM

## 2023-12-18 NOTE — Transitions of Care (Post Inpatient/ED Visit) (Signed)
   12/18/2023  Name: Emily Wagner MRN: [id number] DOB: 04/28/1933  Today's TOC FU Call Status: Today's TOC FU Call Status:: Unsuccessful Call (1st Attempt) Unsuccessful Call (1st Attempt) Date: 12/12/23  Attempted to reach the patient regarding the most recent Inpatient/ED visit.  Follow Up Plan: No further outreach attempts will be made at this time. We have been unable to contact the patient. Patient already seen in office Signature Karena Addison, LPN [facility] Nurse Health Advisor Direct Dial [phone]

## 2023-12-19 ENCOUNTER — Ambulatory Visit
Admission: RE | Admit: 2023-12-19 | Discharge: 2023-12-19 | Disposition: A | Discharge status: Home / Self Care | Payer: Medicare Other | Source: Ambulatory Visit | Attending: Neurology | Admitting: Neurology

## 2023-12-19 ENCOUNTER — Telehealth: Payer: Self-pay | Admitting: Neurology

## 2023-12-19 DIAGNOSIS — R441 Visual hallucinations: Secondary | ICD-10-CM | POA: Diagnosis not present

## 2023-12-19 MED ORDER — GADOPICLENOL 0.5 MMOL/ML IV SOLN
5.0000 mL | Freq: Once | INTRAVENOUS | Status: AC | PRN
  Administered 2023-12-19: 5 mL via INTRAVENOUS

## 2023-12-19 NOTE — Telephone Encounter (Signed)
Spoke with Annabelle Harman and she advised that she was unsure if there was a new area affected on the diffusion imaging with MRI. Dr Pearlean Brownie was able to pull this up and there appears could be a new finding. He asked that the technician complete the MRI fully and then he will review it and will talk to the daughter.  The daughter was made aware that Dr Pearlean Brownie will contact her and they should wait before leaving. Her cell phone number is [phone].

## 2023-12-19 NOTE — Telephone Encounter (Signed)
Annabelle Harman with [facility] calling to speak with nurse. Transferred to POD 3

## 2023-12-20 ENCOUNTER — Other Ambulatory Visit: Payer: Self-pay | Admitting: Family Medicine

## 2023-12-20 DIAGNOSIS — F4323 Adjustment disorder with mixed anxiety and depressed mood: Secondary | ICD-10-CM

## 2023-12-24 ENCOUNTER — Telehealth: Payer: Self-pay

## 2023-12-24 ENCOUNTER — Telehealth: Payer: Self-pay | Admitting: Family Medicine

## 2023-12-24 ENCOUNTER — Other Ambulatory Visit (HOSPITAL_COMMUNITY): Payer: Self-pay

## 2023-12-24 DIAGNOSIS — I10 Essential (primary) hypertension: Secondary | ICD-10-CM

## 2023-12-24 NOTE — Telephone Encounter (Signed)
Per test claim: The current 90 day co-pay is, $0.  No PA needed at this time. This test claim was processed through [facility]- copay amounts may vary at other pharmacies due to pharmacy/plan contracts, or as the patient moves through the different stages of their insurance plan.

## 2023-12-24 NOTE — Telephone Encounter (Unsigned)
Copied from CRM [id number]. Topic: Clinical - Prescription Issue >> Dec 24, 2023  8:37 AM Geroge Baseman wrote: Reason for CRM: Patient is completely out levothyroxine (SYNTHROID) 50 MCG tablet, patient states they  have reached out twice they need approval from the doctor to have this filled needs prior auth

## 2023-12-24 NOTE — Telephone Encounter (Signed)
Levothyroxine does not need a PA per daughter. She states that [facility] told her that they changed manufacturer of thyroid medication and needs approval from Dr. Louanne Skye that it is alright.  Needs med sent ASAP.

## 2023-12-24 NOTE — Telephone Encounter (Signed)
Pharmacy Patient Advocate Encounter   Received notification from Pt Calls Messages that prior authorization for Levothyroxine is required/requested.   Insurance verification completed.   The patient is insured through  [employer]  .   Per test claim: The current 90 day co-pay is, $0.  No PA needed at this time. This test claim was processed through [facility]- copay amounts may vary at other pharmacies due to pharmacy/plan contracts, or as the patient moves through the different stages of their insurance plan.

## 2023-12-26 ENCOUNTER — Telehealth: Payer: Self-pay | Admitting: Cardiology

## 2023-12-26 ENCOUNTER — Telehealth: Payer: Self-pay

## 2023-12-26 NOTE — Progress Notes (Signed)
Kindly inform the patient that MRI scan of the brain shows area of stroke on the left side towards the back portion of the brain which appears new compared with the CT scan done 9 days before.  There are also several areas of small strokes of old age noted in the brain on the right side and deep portions of the brain.

## 2023-12-26 NOTE — Telephone Encounter (Signed)
Copied from CRM [id number]. Topic: Clinical - Lab/Test Results >> Dec 26, 2023  9:33 AM Phill Myron wrote: Reason for CRM:  Pt Emily Wagner would like a call back regarding her recent MRI results. Please advise

## 2023-12-26 NOTE — Telephone Encounter (Signed)
Looks like she needs to talk to this MRI results to Dr. Pearlean Brownie who was the one that ordered it.  Please put in a message to their office and have her call them.

## 2023-12-26 NOTE — Telephone Encounter (Signed)
They just need [name] to call and say that it is okay to switch to the other manufacture, I am fine with that just call and let them know that it is okay to switch to the other manufacture.  It is not like we have a choice

## 2023-12-26 NOTE — Telephone Encounter (Signed)
Pt c/o medication issue:  1. Name of Medication: apixaban (ELIQUIS) 2.5 MG TABS tablet   2. How are you currently taking this medication (dosage and times per day)?   3. Are you having a reaction (difficulty breathing--STAT)?   4. What is your medication issue? Patient is requesting call back to discuss patient assistance for this medication. Please advise.

## 2023-12-26 NOTE — Telephone Encounter (Signed)
Called left message to call back

## 2023-12-26 NOTE — Telephone Encounter (Signed)
Pharmacy made aware that Dr. Louanne Skye was fine with the manufacturer change. Pts daughter informed as well on cell vmail.

## 2023-12-27 NOTE — Telephone Encounter (Signed)
Called patient and line was busy.

## 2023-12-28 ENCOUNTER — Other Ambulatory Visit: Payer: Self-pay

## 2023-12-28 ENCOUNTER — Telehealth: Payer: Self-pay | Admitting: Family Medicine

## 2023-12-28 DIAGNOSIS — R5381 Other malaise: Secondary | ICD-10-CM

## 2023-12-28 DIAGNOSIS — R296 Repeated falls: Secondary | ICD-10-CM

## 2023-12-28 NOTE — Telephone Encounter (Signed)
Patient identification verified by 2 forms. Marilynn Rail, RN    Called and spoke to patients daughter Cathie Hoops states:   -patient is not out of Eliquis Rx   -received letter stating patient is due to reapply for Eliquis PAP   -has not been able to locate document online  RN sent MyChart message with link to application, copy of application also sent in mail  Aurther Loft confirmed mailing address  Aurther Loft verbalized understanding, no questions at this time    Copy of application also sent in mail to patients home

## 2023-12-28 NOTE — Telephone Encounter (Signed)
Tried to call daughter back but service was going in and out they will need to contact the MD ordering the MRI

## 2023-12-28 NOTE — Telephone Encounter (Signed)
Copied from CRM [id number]. Topic: Clinical - Lab/Test Results >> Dec 28, 2023  8:33 AM Fonda Kinder J wrote: Reason for CRM: Pt's daughter woul like to receive a call to go over nect steps regarding MRI Callback # [phone]

## 2023-12-28 NOTE — Telephone Encounter (Signed)
Called patients daughter back she is aware to call Dr. Pearlean Brownie office to review.

## 2023-12-30 NOTE — Progress Notes (Deleted)
 Cardiology Office Note:  .   Date:  12/30/2023  ID:  Emily Wagner, DOB 1933-03-30, MRN [id number] PCP: [name], Fonda LABOR, MD  [facility] Providers Cardiologist:  Dr. Lavona  History of Present Illness: .   Emily Wagner is a 89 y.o. female with a past medical history of CAD s/p CABG, HLD, HTN, atrial fibrillation, post polio syndrome, chronic fatigue, history of CVA. She is followed by Dr. Lavona and presents today for follow up after CVA.   Patient previously underwent LHC in 10/2015 that showed ostial-proximal ulcerated, heavily calcified and tortuous 90+ percent stenosis in the LAD, representing significant progression over the past 8 years. Also had ostial narrowing in each diagonal, 60% mid RCA stenosis. Patient was referred to CT surgery for CABG. Echocardiogram on 10/31/15 showed EF 60-65%, no regional wall motion abnormalities, mild LVH. Underwent CABGx1 with LIMA-LAD.   Patient was later admitted in 03/2021 with a CVA.Echocardiogram in 03/2021 showed EF 60-65%, mild MR. Later,  cardiac monitor in 06/2021 showed NSR with paroxysmal atrial fibrillation, some runs of atrial fibrillation with RVR. She was started on eliquis . Was later started on digoxin  for rate control.   Patient was last seen by cardiology on 04/18/23. At that time, patient complained of fatigue. She remained on anticoagulation and had good rate control. There were no changes to her medications..   Patient was seen in the ED on 12/11/23 for evaluation of a fall. Found to have right anterior rib fractures 5-6, small pleural effusion, age-indeterminate T3 and T1 fractures, and stranding in the right gluteal tissue consistent with contusion. She declined analgesia other than tylenol . UA consistent with infection, and she was started on ceftriaxone . CT head negative. Recommended admission, but patient preferred to go home. Patient underwent outpatient brain MRI that showed an early subacute left posterior inferior  cerebral artery patchy infarct as well as remote age areas of infarcts with encephalomalacia and gliosis in the right mid temporal , right frontal, and subcortical regions. Dr. Rosemarie with neurology recommended admission, but patient preferred to stay home with her daughter. Daughter set up home Occupational therapy. Neurology recommended that patient stay compliant with eliquis . Also instructed to follow up with cardiology.   PAF  - Diagnosed with paroxysmal atrial fibrillation in 06/2021 while wearing a cardiac monitor after a CVA. Was stared on eliquis  - Now on digoxin  0.125 mg daily, diltiazem  30 mg BID  - Digoxin  level within normal limits (0.7) in 09/2023  - Eliquis  2.5 mg BID- dose reduced for Age, weight.  - With recent CVA, Eliquis  fail? Need for coumadin ?   Recent CVA  - Patient had multiple falls recently, Underwent MRI brain and was found to have a left cerebellar stroke. Felt to be due to atrial fibrillation despite compliance with eliquis   -  - Echo  - Is this an eliquis  fail? Coumadin ?  CAD  - Previously underwent CABGx1 with LIMA-LAD in 2016  - Patient is intolerant to statins  - Not on ASA given eliquis  use  - Not on BB due to history of intolerance   HLD   HTN  -  - Currently on diltiazem  30 mg BID    ROS: ***  Studies Reviewed: .        *** Risk Assessment/Calculations:   {Does this patient have ATRIAL FIBRILLATION?:[phone]} No BP recorded.  {Refresh Note OR Click here to enter BP  :1}***       Physical Exam:   VS:  LMP  03/21/1971    Wt Readings from Last 3 Encounters:  12/17/23 117 lb (53.1 kg)  12/11/23 123 lb (55.8 kg)  11/06/23 121 lb (54.9 kg)    GEN: Well nourished, well developed in no acute distress NECK: No JVD; No carotid bruits CARDIAC: ***RRR, no murmurs, rubs, gallops RESPIRATORY:  Clear to auscultation without rales, wheezing or rhonchi  ABDOMEN: Soft, non-tender, non-distended EXTREMITIES:  No edema; No deformity   ASSESSMENT AND  PLAN: .   ***    {Are you ordering a CV Procedure (e.g. stress test, cath, DCCV, TEE, etc)?   Press F2        :[id number]}  Dispo: ***  Signed, Rollo FABIENE Louder, PA-C

## 2023-12-31 ENCOUNTER — Telehealth: Payer: Self-pay | Admitting: Neurology

## 2023-12-31 NOTE — Telephone Encounter (Signed)
-----   Message from Delia Heady sent at 12/26/2023  7:40 PM EST ----- Kindly inform the patient that MRI scan of the brain shows area of stroke on the left side towards the back portion of the brain which appears new compared with the CT scan done 9 days before.  There are also several areas of small strokes of old age noted in the brain on the right side and deep portions of the brain.

## 2023-12-31 NOTE — Telephone Encounter (Signed)
The daughter called in to get the results from recent MRI. Advised of the new area that was present on the back portion of the left brain and was new from previous Ct imaging that was completed. Reminded the daughter of Dr Marlis Edelson call from where the radiologist reached out to him the day of the imaging and once the images were up, he called her back and reviewed that information. The patient is still having the hallucinations and I advised that she can discuss further with PCP at her upcoming apt scheduled. She was appreciative for the information.

## 2024-01-01 ENCOUNTER — Ambulatory Visit: Payer: Medicare Other | Admitting: Orthopaedic Surgery

## 2024-01-02 ENCOUNTER — Telehealth: Payer: Self-pay

## 2024-01-02 NOTE — Telephone Encounter (Signed)
 Copied from CRM [id number]. Topic: Clinical - Home Health Verbal Orders >> Jan 02, 2024  1:29 PM Ivette P wrote: Pt would like to speak to nurse Odilia Bennett about Home Health care. Pt call back [phone]

## 2024-01-02 NOTE — Telephone Encounter (Signed)
 Per daughter pt has an appt this Friday and will discuss during visit.  Home health has been denied. Pt has been ordered but daughter request that it be in the home instead of [place] location.

## 2024-01-04 ENCOUNTER — Ambulatory Visit: Payer: No Typology Code available for payment source | Admitting: Cardiology

## 2024-01-04 ENCOUNTER — Ambulatory Visit (INDEPENDENT_AMBULATORY_CARE_PROVIDER_SITE_OTHER): Payer: Medicare Other | Admitting: Family Medicine

## 2024-01-04 ENCOUNTER — Encounter: Payer: Self-pay | Admitting: Family Medicine

## 2024-01-04 ENCOUNTER — Ambulatory Visit (INDEPENDENT_AMBULATORY_CARE_PROVIDER_SITE_OTHER): Payer: Medicare Other

## 2024-01-04 VITALS — BP 132/81 | HR 106 | Ht 64.0 in | Wt 117.0 lb

## 2024-01-04 DIAGNOSIS — M25552 Pain in left hip: Secondary | ICD-10-CM

## 2024-01-04 DIAGNOSIS — F4323 Adjustment disorder with mixed anxiety and depressed mood: Secondary | ICD-10-CM

## 2024-01-04 DIAGNOSIS — I10 Essential (primary) hypertension: Secondary | ICD-10-CM

## 2024-01-04 DIAGNOSIS — S32592D Other specified fracture of left pubis, subsequent encounter for fracture with routine healing: Secondary | ICD-10-CM

## 2024-01-04 DIAGNOSIS — M160 Bilateral primary osteoarthritis of hip: Secondary | ICD-10-CM | POA: Diagnosis not present

## 2024-01-04 DIAGNOSIS — E7800 Pure hypercholesterolemia, unspecified: Secondary | ICD-10-CM

## 2024-01-04 DIAGNOSIS — S32592A Other specified fracture of left pubis, initial encounter for closed fracture: Secondary | ICD-10-CM

## 2024-01-04 MED ORDER — TRAMADOL HCL 50 MG PO TABS: 50.0000 mg | ORAL_TABLET | Freq: Three times a day (TID) | ORAL | 0 refills | Status: DC | PRN

## 2024-01-04 MED ORDER — PAROXETINE HCL 20 MG PO TABS: 20.0000 mg | ORAL_TABLET | Freq: Every day | ORAL | 1 refills | Status: DC

## 2024-01-04 NOTE — Progress Notes (Signed)
 BP 132/81   Pulse (!) 106   Ht 5' 4 (1.626 m)   Wt 117 lb (53.1 kg)   LMP 03/21/1971   SpO2 95%   BMI 20.08 kg/m    Subjective:   Patient ID: Emily Wagner, female    DOB: 09/16/1933, 89 y.o.   MRN: [id number]  HPI: Emily Wagner is a 89 y.o. female presenting on 01/04/2024 for Medical Management of Chronic Issues, Hyperlipidemia, Hypertension, and Leg Pain (Sharp pain in left thigh)   HPI Hyperlipidemia Patient is coming in for recheck of his hyperlipidemia. The patient is currently taking no medicine, resistant to cholesterol medicines. They deny any issues with myalgias or history of liver damage from it. They deny any focal numbness or weakness or chest pain.   Hypertension Patient is currently on diltiazem  and digoxin , and their blood pressure today is 132/81. Patient denies any lightheadedness or dizziness. Patient denies headaches, blurred vision, chest pains, shortness of breath, or weakness. Denies any side effects from medication and is content with current medication.   Anxiety recheck Patient is coming in today for anxiety recheck and currently taking paroxetine .  She is still having some memory issues but the anxiety per her daughter does a little bit better.  Left hip pain.   Patient has had a couple of falls weeks ago where she fell back striking the top daughter was able to wash her and the next day she is fine.  She said about 4 to 5 days later she started having a significant left hip pain and left groin pain that goes down with.  Left leg.  Relevant past medical, surgical, family and social history reviewed and updated as indicated. Interim medical history since our last visit reviewed. Allergies and medications reviewed and updated.  Review of Systems  Constitutional:  Negative for chills and fever.  Eyes:  Negative for redness and visual disturbance.  Respiratory:  Negative for chest tightness and shortness of breath.   Cardiovascular:  Negative for  chest pain and leg swelling.  Genitourinary:  Negative for difficulty urinating and dysuria.  Musculoskeletal:  Positive for arthralgias, gait problem and myalgias. Negative for back pain.  Skin:  Negative for rash.  Neurological:  Negative for light-headedness and headaches.  Psychiatric/Behavioral:  Negative for agitation and behavioral problems.   All other systems reviewed and are negative.   Per HPI unless specifically indicated above   Allergies as of 01/04/2024       Reactions   Nitrofurantoin Other (See Comments)   Liver failure   Nsaids Other (See Comments)   Kidney failure   Other Other (See Comments)   Most antibiotics require supervision per patient   Beta Adrenergic Blockers Other (See Comments)   Flonase [fluticasone Propionate] Other (See Comments)   headache   Prevnar 13 [pneumococcal 13-val Conj Vacc] Other (See Comments)   Nerve damage in left arm   Propranolol Hcl Other (See Comments)   Unknown   Hydrocodone Other (See Comments)   Alendronate Sodium Other (See Comments)   Ciprofloxacin  Other (See Comments)   Patient denies allergy--I take Cipro  for bladder infections   Codeine Other (See Comments)   Crestor [rosuvastatin Calcium ] Other (See Comments)   Darvon Other (See Comments)   Lipitor [atorvastatin  Calcium ] Other (See Comments)   Nortriptyline  Hcl Other (See Comments)   States she felt like worms where crawling under her skin.   Potassium-containing Compounds    Rash and raw areas / sore breast  Pravachol Other (See Comments)   Sulfa Antibiotics Other (See Comments)   Sulfamethoxazole Other (See Comments)   Unknown   Ultram  [tramadol  Hcl] Itching   Welchol [colesevelam Hcl] Other (See Comments)   Zetia [ezetimibe] Other (See Comments)   Zocor [simvastatin] Other (See Comments)        Medication List        Accurate as of January 04, 2024 11:08 AM. If you have any questions, ask your nurse or doctor.          acetaminophen  325 MG  tablet Commonly known as: TYLENOL  Take 325 mg by mouth every 6 (six) hours as needed.   ALPRAZolam  0.5 MG tablet Commonly known as: XANAX  Take 1 tablet (0.5 mg total) by mouth at bedtime as needed for anxiety (MRI). Pt may take 1 tablet 30 min prior to MRI and then repeat if needed at the time of the MRI. (MUST have a driver)   apixaban  2.5 MG Tabs tablet Commonly known as: ELIQUIS  Take 1 tablet (2.5 mg total) by mouth 2 (two) times daily.   cholecalciferol  25 MCG (1000 UNIT) tablet Commonly known as: VITAMIN D3 Take 1,000 Units by mouth daily.   digoxin  0.125 MG tablet Commonly known as: LANOXIN  TAKE 1 TABLET BY MOUTH DAILY   diltiazem  30 MG tablet Commonly known as: CARDIZEM  TAKE 1 TABLET BY MOUTH 2 TIMES DAILY.   famotidine  20 MG tablet Commonly known as: PEPCID  Take 1 tablet (20 mg total) by mouth 2 (two) times daily.   levothyroxine  50 MCG tablet Commonly known as: SYNTHROID  Take 1 tablet (50 mcg total) by mouth daily before breakfast.   lidocaine  5 % Commonly known as: Lidoderm  Place 1 patch onto the skin daily. Remove & Discard patch within 12 hours or as directed by MD   methocarbamol  500 MG tablet Commonly known as: ROBAXIN  Take 1 tablet (500 mg total) by mouth every 12 (twelve) hours as needed for up to 12 doses for muscle spasms.   PARoxetine  20 MG tablet Commonly known as: PAXIL  Take 1 tablet (20 mg total) by mouth daily.   PRESERVISION AREDS 2 PO Take 1 capsule by mouth 2 (two) times daily.   QUEtiapine  50 MG tablet Commonly known as: SEROQUEL  Take 75 mg by mouth at bedtime.   VITAMIN B-12 PO Take 500 mcg by mouth. Take one tablet by mouth on Mon.,Wed., and Friday         Objective:   BP 132/81   Pulse (!) 106   Ht 5' 4 (1.626 m)   Wt 117 lb (53.1 kg)   LMP 03/21/1971   SpO2 95%   BMI 20.08 kg/m   Wt Readings from Last 3 Encounters:  01/04/24 117 lb (53.1 kg)  12/17/23 117 lb (53.1 kg)  12/11/23 123 lb (55.8 kg)    Physical  Exam Vitals and nursing note reviewed.  Constitutional:      General: She is not in acute distress.    Appearance: She is well-developed. She is not diaphoretic.  Eyes:     Conjunctiva/sclera: Conjunctivae normal.  Cardiovascular:     Rate and Rhythm: Normal rate and regular rhythm.     Heart sounds: Normal heart sounds. No murmur heard. Pulmonary:     Effort: Pulmonary effort is normal. No respiratory distress.     Breath sounds: Normal breath sounds. No wheezing.  Musculoskeletal:        General: No swelling.     Left hip: Tenderness, bony tenderness and crepitus present.  No deformity. Decreased strength.       Legs:  Skin:    General: Skin is warm and dry.     Findings: No rash.  Neurological:     Mental Status: She is alert and oriented to person, place, and time.     Coordination: Coordination normal.  Psychiatric:        Behavior: Behavior normal.    Left hip x-ray: Inferior ramus of the pubis shows a slight fracture with some healing changes.  Await final read from radiology.   Assessment & Plan:   Problem List Items Addressed This Visit       Cardiovascular and Mediastinum   Essential hypertension - Primary     Other   HYPERCHOLESTEROLEMIA   Adjustment disorder with mixed anxiety and depressed mood   Relevant Medications   PARoxetine  (PAXIL ) 20 MG tablet   Other Visit Diagnoses       Left hip pain       Relevant Medications   traMADol  (ULTRAM ) 50 MG tablet   Other Relevant Orders   DG HIP UNILAT W OR W/O PELVIS 2-3 VIEWS LEFT     Other closed fracture of left pubis, initial encounter [facility])           Discussed with patient the fracture and that at this location it is likely just conservative management and they would not do anything for it.  We tried for home health and she was unable to get it covered or get somebody to come out for her insurance plan.  Will try a short course of tramadol  which she said only caused itching and see if she does okay  with it help with the pain and encouraged exercises at home.  She does not want to be referred to PT in office. Follow up plan: Return in about 3 months (around 04/02/2024), or if symptoms worsen or fail to improve, for Hypertension hyperlipidemia.  Counseling provided for all of the vaccine components Orders Placed This Encounter  Procedures   DG HIP UNILAT W OR W/O PELVIS 2-3 VIEWS LEFT    Fonda Levins, MD [facility] 01/04/2024, 11:08 AM

## 2024-01-10 ENCOUNTER — Telehealth: Payer: Self-pay | Admitting: Family Medicine

## 2024-01-10 NOTE — Telephone Encounter (Signed)
Per referral notes: Emily Wagner, Emily Wagner denied pt. Daughter, Emily Wagner made aware.

## 2024-01-10 NOTE — Telephone Encounter (Signed)
Copied from CRM [id number]. Topic: General - Other >> Jan 10, 2024  9:02 AM Desma Mcgregor wrote: Reason for CRM: Aurther Loft requesting to speak with nurse Morrie Sheldon regarding the home health issue. Please contact asap.

## 2024-01-10 NOTE — Telephone Encounter (Signed)
LMOVM to Emily Wagner w/ [facility] regarding [facility] agency's that we sent pt's referral to, they were [facility], [facility], [facility]. They were either not able to take d/t insurance or probably staffing.

## 2024-01-10 NOTE — Telephone Encounter (Signed)
Copied from CRM [id number]. Topic: Referral - Question >> Jan 10, 2024 12:22 PM Shelah Lewandowsky wrote: Reason for CRM: Tory with [facility], need a list of home health providers that have been referred to patient already, she has been denied to all the ones that have been referred so far, please call [phone]

## 2024-01-15 ENCOUNTER — Ambulatory Visit (INDEPENDENT_AMBULATORY_CARE_PROVIDER_SITE_OTHER): Payer: Medicare Other

## 2024-01-15 VITALS — Ht 64.0 in | Wt 117.0 lb

## 2024-01-15 DIAGNOSIS — Z0000 Encounter for general adult medical examination without abnormal findings: Secondary | ICD-10-CM | POA: Diagnosis not present

## 2024-01-15 NOTE — Patient Instructions (Signed)
Ms. Westergren , Thank you for taking time to come for your Medicare Wellness Visit. I appreciate your ongoing commitment to your health goals. Please review the following plan we discussed and let me know if I can assist you in the future.   Referrals/Orders/Follow-Ups/Clinician Recommendations: Aim for 30 minutes of exercise or brisk walking, 6-8 glasses of water, and 5 servings of fruits and vegetables each day.  This is a list of the screening recommended for you and due dates:  Health Maintenance  Topic Date Due   COVID-19 Vaccine (3 - Moderna risk series) 03/02/2020   Medicare Annual Wellness Visit  01/14/2025   DTaP/Tdap/Td vaccine (3 - Td or Tdap) 01/01/2033   Pneumonia Vaccine  Completed   Flu Shot  Completed   DEXA scan (bone density measurement)  Completed   Zoster (Shingles) Vaccine  Completed   HPV Vaccine  Aged Out    Advanced directives: (ACP Link)Information on Advanced Care Planning can be found at [facility] Advance Health Care Directives Advance Health Care Directives ([email or web address]   Next Medicare Annual Wellness Visit scheduled for next year: Yes

## 2024-01-15 NOTE — Progress Notes (Signed)
Subjective:   Emily Wagner is a 89 y.o. female who presents for Medicare Annual (Subsequent) preventive examination.  Visit Complete: Virtual I connected with  [name] on 01/15/24 by a audio enabled telemedicine application and verified that I am speaking with the correct person using two identifiers.  Patient Location: Home  Provider Location: Home Office  This patient declined Interactive audio and video telecommunications. Therefore the visit was completed with audio only.  I discussed the limitations of evaluation and management by telemedicine. The patient expressed understanding and agreed to proceed.  Vital Signs: Because this visit was a virtual/telehealth visit, some criteria may be missing or patient reported. Any vitals not documented were not able to be obtained and vitals that have been documented are patient reported.  Cardiac Risk Factors include: advanced age (>15men, >107 women);dyslipidemia;hypertension     Objective:    Today's Vitals   01/15/24 1627  Weight: 117 lb (53.1 kg)  Height: 5\' 4"  (1.626 m)   Body mass index is 20.08 kg/m.     01/15/2024    4:30 PM 06/08/2023    3:32 PM 01/08/2023    9:35 AM 10/08/2022   12:37 PM 01/04/2022   11:45 AM 03/27/2021    2:50 PM 01/03/2021   10:12 AM  Advanced Directives  Does Patient Have a Medical Advance Directive? Yes No Yes No Yes No Yes  Type of Estate agent of [place];Living will  Healthcare Power of [place];Living will  Living will;Healthcare Power of Attorney    Does patient want to make changes to medical advance directive? No - Patient declined  No - Patient declined      Copy of Healthcare Power of Attorney in Chart? Yes - validated most recent copy scanned in chart (See row information)  Yes - validated most recent copy scanned in chart (See row information)  Yes - validated most recent copy scanned in chart (See row information)    Would patient like information on  creating a medical advance directive?      No - Guardian declined     Current Medications (verified) Outpatient Encounter Medications as of 01/15/2024  Medication Sig   acetaminophen (TYLENOL) 325 MG tablet Take 325 mg by mouth every 6 (six) hours as needed.   ALPRAZolam (XANAX) 0.5 MG tablet Take 1 tablet (0.5 mg total) by mouth at bedtime as needed for anxiety (MRI). Pt may take 1 tablet 30 min prior to MRI and then repeat if needed at the time of the MRI. (MUST have a driver)   apixaban (ELIQUIS) 2.5 MG TABS tablet Take 1 tablet (2.5 mg total) by mouth 2 (two) times daily.   cholecalciferol (VITAMIN D3) 25 MCG (1000 UNIT) tablet Take 1,000 Units by mouth daily.   Cyanocobalamin (VITAMIN B-12 PO) Take 500 mcg by mouth. Take one tablet by mouth on Mon.,Wed., and Friday   digoxin (LANOXIN) 0.125 MG tablet TAKE 1 TABLET BY MOUTH DAILY   diltiazem (CARDIZEM) 30 MG tablet TAKE 1 TABLET BY MOUTH 2 TIMES DAILY.   famotidine (PEPCID) 20 MG tablet Take 1 tablet (20 mg total) by mouth 2 (two) times daily.   levothyroxine (SYNTHROID) 50 MCG tablet Take 1 tablet (50 mcg total) by mouth daily before breakfast.   lidocaine (LIDODERM) 5 % Place 1 patch onto the skin daily. Remove & Discard patch within 12 hours or as directed by MD   methocarbamol (ROBAXIN) 500 MG tablet Take 1 tablet (500 mg total) by  mouth every 12 (twelve) hours as needed for up to 12 doses for muscle spasms.   Multiple Vitamins-Minerals (PRESERVISION AREDS 2 PO) Take 1 capsule by mouth 2 (two) times daily.   PARoxetine (PAXIL) 20 MG tablet Take 1 tablet (20 mg total) by mouth daily.   QUEtiapine (SEROQUEL) 50 MG tablet Take 75 mg by mouth at bedtime.   traMADol (ULTRAM) 50 MG tablet Take 1 tablet (50 mg total) by mouth every 8 (eight) hours as needed.   No facility-administered encounter medications on file as of 01/15/2024.    Allergies (verified) Nitrofurantoin, Nsaids, Other, Beta adrenergic blockers, Flonase [fluticasone  propionate], Prevnar 13 [pneumococcal 13-val conj vacc], Propranolol hcl, Hydrocodone, Alendronate sodium, Ciprofloxacin, Codeine, Crestor [rosuvastatin calcium], Darvon, Lipitor [atorvastatin calcium], Nortriptyline hcl, Potassium-containing compounds, Pravachol, Sulfa antibiotics, Sulfamethoxazole, Ultram [tramadol hcl], Welchol [colesevelam hcl], Zetia [ezetimibe], and Zocor [simvastatin]   History: Past Medical History:  Diagnosis Date   Adjustment disorder with mixed anxiety and depressed mood    Atrial fibrillation (HCC) 11/04/2015   Post-operative   Benign hypertensive heart disease without heart failure    CAD (coronary artery disease)    Infection of urinary tract 10/31/2015   Interstitial cystitis    Osteopenia    Other and unspecified hyperlipidemia    Other dyspnea and respiratory abnormality    Pelvic fracture (HCC) 05/30/2015   Post-polio syndrome    polio age 89   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   LIMA to LAD   Stroke [facility])    Past Surgical History:  Procedure Laterality Date   ABDOMINAL HYSTERECTOMY     Cancer and endometriosis   CARDIAC CATHETERIZATION  2008   CARDIAC CATHETERIZATION N/A 10/29/2015   Procedure: Left Heart Cath and Coronary Angiography;  Surgeon: Lyn Records, MD;  Location: [facility] INVASIVE CV LAB;  Service: Cardiovascular;  Laterality: N/A;   CATARACT EXTRACTION     CHOLECYSTECTOMY     CORONARY ARTERY BYPASS GRAFT N/A 11/02/2015   Procedure: OFF PUMP CORONARY ARTERY BYPASS GRAFTING (CABG) TIMES ONE;  Surgeon: Purcell Nails, MD;  Location: [facility] OR;  Service: Open Heart Surgery;  Laterality: N/A;  LIMA to LAD   FEMUR FRACTURE SURGERY  09/2015   right hip compression screws    FRACTURE SURGERY     Right hip   HERNIA REPAIR     Right inguinal   TEE WITHOUT CARDIOVERSION N/A 11/02/2015   Procedure: TRANSESOPHAGEAL ECHOCARDIOGRAM (TEE);  Surgeon: Purcell Nails, MD;  Location: [facility];  Service: Open Heart Surgery;  Laterality: N/A;   Family History   Problem Relation Age of Onset   CAD Mother 76       Died suddenly   CAD Father 56       Died suddenly   CAD Daughter 39   Diabetes type I Daughter        Since age 93   Heart disease Sister    Osteoporosis Sister    Social History   Socioeconomic History   Marital status: Widowed    Spouse name: Not on file   Number of children: 2   Years of education: Not on file   Highest education level: High school graduate  Occupational History   Occupation: retired    Associate Professor: [employer]    Comment: admin. assistant  Tobacco Use   Smoking status: Never   Smokeless tobacco: Never  Vaping Use   Vaping status: Never Used  Substance and Sexual Activity   Alcohol use: No  Drug use: No   Sexual activity: Not on file  Other Topics Concern   Not on file  Social History Narrative   01/04/22 lives alone   Daughter and granddaughter live close by and visit daily   Right Handed   Drinks no caffeine daily   Social Drivers of Health   Financial Resource Strain: Low Risk  (01/15/2024)   Overall Financial Resource Strain (CARDIA)    Difficulty of Paying Living Expenses: Not hard at all  Food Insecurity: No Food Insecurity (01/15/2024)   Hunger Vital Sign    Worried About Running Out of Food in the Last Year: Never true    Ran Out of Food in the Last Year: Never true  Transportation Needs: No Transportation Needs (01/15/2024)   PRAPARE - Administrator, Civil Service (Medical): No    Lack of Transportation (Non-Medical): No  Physical Activity: Inactive (01/15/2024)   Exercise Vital Sign    Days of Exercise per Week: 0 days    Minutes of Exercise per Session: 0 min  Stress: No Stress Concern Present (01/15/2024)   [employer] of Occupational Health - Occupational Stress Questionnaire    Feeling of Stress : Not at all  Social Connections: Moderately Integrated (01/15/2024)   Social Connection and Isolation Panel [NHANES]    Frequency of Communication with Friends and  Family: More than three times a week    Frequency of Social Gatherings with Friends and Family: Three times a week    Attends Religious Services: More than 4 times per year    Active Member of Clubs or Organizations: Yes    Attends Banker Meetings: More than 4 times per year    Marital Status: Widowed    Tobacco Counseling Counseling given: Not Answered   Clinical Intake:  Pre-visit preparation completed: Yes  Pain : No/denies pain     Diabetes: No  How often do you need to have someone help you when you read instructions, pamphlets, or other written materials from your doctor or pharmacy?: 1 - Never  Interpreter Needed?: No  Information entered by :: Kandis Fantasia LPN   Activities of Daily Living    01/15/2024    4:30 PM  In your present state of health, do you have any difficulty performing the following activities:  Hearing? 0  Vision? 0  Difficulty concentrating or making decisions? 0  Walking or climbing stairs? 1  Dressing or bathing? 0  Doing errands, shopping? 1  Preparing Food and eating ? N  Using the Toilet? N  In the past six months, have you accidently leaked urine? N  Do you have problems with loss of bowel control? N  Managing your Medications? Y  Managing your Finances? Y  Housekeeping or managing your Housekeeping? Y    Patient Care Team: [name], Elige Radon, MD as PCP - General (Family Medicine) [name], MD (Cardiology) Adam Phenix, DPM as Consulting Physician (Podiatry) [facility]  Indicate any recent Medical Services you may have received from other than [facility] providers in the past year (date may be approximate).     Assessment:   This is a routine wellness examination for Toyna.  Hearing/Vision screen Hearing Screening - Comments:: Denies hearing difficulties   Vision Screening - Comments:: Wears rx glasses - up to date with routine eye exams with [facility]     Goals Addressed    None   Depression Screen    01/15/2024    4:28  PM 01/04/2024   10:51 AM 12/17/2023   12:03 PM 11/01/2023    3:46 PM 09/27/2023   10:18 AM 08/23/2023   11:18 AM 07/18/2023   11:21 AM  PHQ 2/9 Scores  PHQ - 2 Score 1 1 1 1 1  0 0  PHQ- 9 Score 7  7 11 6 2 1     Fall Risk    01/15/2024    4:30 PM 01/04/2024   10:51 AM 12/17/2023   12:02 PM 11/01/2023    3:46 PM 09/27/2023   10:14 AM  Fall Risk   Falls in the past year? 1 1 1 1 1   Number falls in past yr: 1 1 1 1 1   Injury with Fall? 1 1 1 1 1   Risk for fall due to : History of fall(s);Impaired balance/gait;Impaired mobility History of fall(s);Impaired balance/gait;Impaired mobility Impaired balance/gait;History of fall(s);Impaired mobility History of fall(s);Impaired balance/gait History of fall(s);Impaired balance/gait  Follow up Education provided;Falls prevention discussed;Falls evaluation completed Falls evaluation completed Falls evaluation completed Falls evaluation completed Falls evaluation completed    MEDICARE RISK AT HOME: Medicare Risk at Home Any stairs in or around the home?: No If so, are there any without handrails?: No Home free of loose throw rugs in walkways, pet beds, electrical cords, etc?: Yes Adequate lighting in your home to reduce risk of falls?: Yes Life alert?: No Use of a cane, walker or w/c?: Yes Grab bars in the bathroom?: Yes Shower chair or bench in shower?: Yes Elevated toilet seat or a handicapped toilet?: Yes  TIMED UP AND GO:  Was the test performed?  No    Cognitive Function:    10/11/2023    8:22 AM 09/20/2021    9:27 AM 08/23/2018   10:57 AM  MMSE - Mini Mental State Exam  Orientation to time 5 5 5   Orientation to Place 5 5 5   Registration 3 3 3   Attention/ Calculation 5 5 5   Recall 3 2 3   Language- name 2 objects 2 2 2   Language- repeat 1 1 1   Language- follow 3 step command 3 3 3   Language- read & follow direction 1 1 1   Write a sentence 1 1 1   Copy design 1 1 1   Total score  30 29 30         01/15/2024    4:30 PM 01/08/2023    9:35 AM 01/03/2021   10:19 AM 01/03/2021   10:10 AM  6CIT Screen  What Year? 0 points 0 points  0 points  What month? 0 points 0 points  0 points  What time? 0 points 0 points  0 points  Count back from 20 0 points 0 points  0 points  Months in reverse 2 points 0 points 0 points   Repeat phrase 2 points 0 points 0 points   Total Score 4 points 0 points      Immunizations Immunization History  Administered Date(s) Administered   Fluad Quad(high Dose 65+) 08/18/2019, 08/23/2020, 08/17/2021, 08/17/2022   Fluad Trivalent(High Dose 65+) 10/17/2023   Influenza Whole 07/28/2010   Influenza, High Dose Seasonal PF 07/15/2016, 09/03/2017, 08/23/2018   Influenza,inj,Quad PF,6+ Mos 09/23/2013, 09/09/2014, 09/07/2015   Moderna Sars-Covid-2 Vaccination 01/06/2020, 02/03/2020   Pneumococcal Conjugate-13 05/14/2014   Pneumococcal Polysaccharide-23 08/18/2019   Td 04/14/2008   Tdap 01/01/2023   Zoster Recombinant(Shingrix) 12/08/2021, 03/15/2022    TDAP status: Up to date  Flu Vaccine status: Up to date  Pneumococcal vaccine status: Up to date  Covid-19 vaccine  status: Information provided on how to obtain vaccines.   Qualifies for Shingles Vaccine? Yes   Zostavax completed No   Shingrix Completed?: Yes  Screening Tests Health Maintenance  Topic Date Due   COVID-19 Vaccine (3 - Moderna risk series) 03/02/2020   Medicare Annual Wellness (AWV)  01/14/2025   DTaP/Tdap/Td (3 - Td or Tdap) 01/01/2033   Pneumonia Vaccine 74+ Years old  Completed   INFLUENZA VACCINE  Completed   DEXA SCAN  Completed   Zoster Vaccines- Shingrix  Completed   HPV VACCINES  Aged Out    Health Maintenance  Health Maintenance Due  Topic Date Due   COVID-19 Vaccine (3 - Moderna risk series) 03/02/2020    Colorectal cancer screening: No longer required.   Mammogram status: No longer required due to age and preference.  Bone Density status:  No  longer needed  Lung Cancer Screening: (Low Dose CT Chest recommended if Age 59-80 years, 20 pack-year currently smoking OR have quit w/in 15years.) does not qualify.   Lung Cancer Screening Referral: n/a  Additional Screening:  Hepatitis C Screening: does not qualify  Vision Screening: Recommended annual ophthalmology exams for early detection of glaucoma and other disorders of the eye. Is the patient up to date with their annual eye exam?  Yes  Who is the provider or what is the name of the office in which the patient attends annual eye exams? [facility] If pt is not established with a provider, would they like to be referred to a provider to establish care? No .   Dental Screening: Recommended annual dental exams for proper oral hygiene  Community Resource Referral / Chronic Care Management: CRR required this visit?  No   CCM required this visit?  No     Plan:     I have personally reviewed and noted the following in the patient's chart:   Medical and social history Use of alcohol, tobacco or illicit drugs  Current medications and supplements including opioid prescriptions. Patient is not currently taking opioid prescriptions. Functional ability and status Nutritional status Physical activity Advanced directives List of other physicians Hospitalizations, surgeries, and ER visits in previous 12 months Vitals Screenings to include cognitive, depression, and falls Referrals and appointments  In addition, I have reviewed and discussed with patient certain preventive protocols, quality metrics, and best practice recommendations. A written personalized care plan for preventive services as well as general preventive health recommendations were provided to patient.     Kandis Fantasia [place], [place]   1/61/0960   After Visit Summary: (MyChart) Due to this being a telephonic visit, the after visit summary with patients personalized plan was offered to patient via MyChart    Nurse Notes: No concerns at this time

## 2024-01-18 ENCOUNTER — Encounter: Payer: Self-pay | Admitting: Family Medicine

## 2024-01-25 ENCOUNTER — Telehealth: Payer: Self-pay

## 2024-01-25 NOTE — Telephone Encounter (Signed)
 Copied from CRM [id number]. Topic: General - Other >> Jan 25, 2024  1:01 PM Kristie Cowman wrote: Reason for CRM: Patient calling in asking that Dr. Darrol Poke nurse Morrie Sheldon call her back today regarding questions about her kidneys.  Patient hung up before I was able to get any further information.

## 2024-01-25 NOTE — Telephone Encounter (Signed)
No answer. Left message to return call.

## 2024-01-28 ENCOUNTER — Encounter: Payer: Self-pay | Admitting: Nurse Practitioner

## 2024-01-28 ENCOUNTER — Other Ambulatory Visit: Payer: Self-pay | Admitting: Family Medicine

## 2024-01-28 ENCOUNTER — Ambulatory Visit: Payer: Self-pay | Admitting: Family Medicine

## 2024-01-28 ENCOUNTER — Ambulatory Visit (INDEPENDENT_AMBULATORY_CARE_PROVIDER_SITE_OTHER): Admitting: Nurse Practitioner

## 2024-01-28 VITALS — BP 163/111 | HR 69 | Temp 97.9°F

## 2024-01-28 DIAGNOSIS — N3000 Acute cystitis without hematuria: Secondary | ICD-10-CM

## 2024-01-28 DIAGNOSIS — N3010 Interstitial cystitis (chronic) without hematuria: Secondary | ICD-10-CM

## 2024-01-28 DIAGNOSIS — R410 Disorientation, unspecified: Secondary | ICD-10-CM

## 2024-01-28 DIAGNOSIS — F4323 Adjustment disorder with mixed anxiety and depressed mood: Secondary | ICD-10-CM

## 2024-01-28 LAB — URINALYSIS, COMPLETE
Bilirubin, UA: NEGATIVE
Glucose, UA: NEGATIVE
Ketones, UA: NEGATIVE
Nitrite, UA: NEGATIVE
RBC, UA: NEGATIVE
Specific Gravity, UA: 1.02 (ref 1.005–1.030)
Urobilinogen, Ur: 1 mg/dL (ref 0.2–1.0)
pH, UA: 6.5 (ref 5.0–7.5)

## 2024-01-28 LAB — MICROSCOPIC EXAMINATION
Renal Epithel, UA: NONE SEEN /HPF
Yeast, UA: NONE SEEN

## 2024-01-28 MED ORDER — DOXYCYCLINE HYCLATE 100 MG PO TABS: 100.0000 mg | ORAL_TABLET | Freq: Two times a day (BID) | ORAL | 0 refills | Status: DC

## 2024-01-28 NOTE — Telephone Encounter (Signed)
  Chief Complaint: More confusion than usual, pt states she wants her kidneys checked out Symptoms: confusion Frequency: early middle last week Pertinent Negatives: Patient denies frequency, urgency, fever or other usual uti s/s Disposition: [] ED /[] Urgent Care (no appt availability in office) / [x] Appointment(In office/virtual)/ []  [facility]/ [] Home Care/ [] Refused Recommended Disposition /[] [facility]/ []  Follow-up with PCP Additional Notes: Spoke with daughter Aurther Loft. She states that pt is having more hallucinations than usual. Pt states that she wants her kidneys checked out. Daughter and pt thinks she has a UTI. Appt made for today. Copied from CRM [id number]. Topic: Clinical - Red Word Triage >> Jan 28, 2024  8:16 AM Maxwell Marion wrote: [facility] that prompted transfer to Nurse Triage: patient's daughter Aurther Loft wants her to be tested for UTI, says she has been having symptoms since last week Reason for Disposition  Side (flank) or lower back pain present  Answer Assessment - Initial Assessment Questions 1. SYMPTOM: "What's the main symptom you're concerned about?" (e.g., frequency, incontinence)     More confusion than usual, pt states she wants her kidney's checked 2. ONSET: "When did the  s/s  start?"     Early- middle last week 4. CAUSE: "What do you think is causing the symptoms?"     uti 5. OTHER SYMPTOMS: "Do you have any other symptoms?" (e.g., blood in urine, fever, flank pain, pain with urination)     unknown  Protocols used: Urinary Symptoms-A-AH

## 2024-01-28 NOTE — Telephone Encounter (Signed)
 Pt has visit with MMM today

## 2024-01-28 NOTE — Progress Notes (Signed)
   Subjective:    Patient ID: Emily Wagner, female    DOB: 1933/08/17, 89 y.o.   MRN: [id number]   Chief Complaint: Altered Mental Status   Altered Mental Status    Family brings patient in. They think she has a uti. Has had urgency and frequency with mild confusion. She stays confused but  is worse when she has a UTI. Patient Active Problem List   Diagnosis Date Noted   Falls frequently 11/12/2023   LBBB (left bundle branch block) 02/03/2023   Hx of ischemic right MCA stroke 07/04/2021   Left hemiparesis (HCC) 03/27/2021   Acquired hypothyroidism 03/27/2021   Aortic atherosclerosis (HCC) 12/12/2017   Atrial fibrillation (HCC) 11/04/2015   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   Unstable angina (HCC) 10/29/2015   CAD (coronary artery disease) 10/29/2015   Benign hypertensive heart disease without heart failure    Interstitial cystitis    S/P hip hemiarthroplasty 11/07/2012   Renal insufficiency    Adjustment disorder with mixed anxiety and depressed mood    Osteoporosis    ASCVD (arteriosclerotic cardiovascular disease)    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  Musculoskeletal:  Positive for back pain.  Psychiatric/Behavioral:  Positive for confusion.        Objective:   Physical Exam Constitutional:      Appearance: Normal appearance.  Cardiovascular:     Rate and Rhythm: Normal rate and regular rhythm.     Heart sounds: Normal heart sounds.  Pulmonary:     Effort: Pulmonary effort is normal.     Breath sounds: Normal breath sounds.  Skin:    General: Skin is warm.  Neurological:     General: No focal deficit present.     Mental Status: She is alert and oriented to person, place, and time.  Psychiatric:        Mood and Affect: Mood normal.        Behavior: Behavior normal.     BP (!) 163/111   Pulse 69   Temp 97.9 F (36.6 C) (Temporal)   LMP 03/21/1971   SpO2 97%         Assessment & Plan:   Emily Wagner in today with chief complaint of Altered Mental Status   1. Confusion (Primary) Orient daily - Urinalysis, Complete - Urine Culture  2. Acute cystitis without hematuria Take medication as prescribe Cotton underwear Take shower not bath Cranberry juice, yogurt Force fluids AZO over the counter X2 days Culture pending RTO prn  - doxycycline (VIBRA-TABS) 100 MG tablet; Take 1 tablet (100 mg total) by mouth 2 (two) times daily. 1 po bid  Dispense: 20 tablet; Refill: 0    The above assessment and management plan was discussed with the patient. The patient verbalized understanding of and has agreed to the management plan. Patient is aware to call the clinic if symptoms persist or worsen. Patient is aware when to return to the clinic for a follow-up visit. Patient educated on when it is appropriate to go to the emergency department.   Mary-Margaret Daphine Deutscher, FNP

## 2024-01-28 NOTE — Patient Instructions (Addendum)
 Take medication as prescribe Cotton underwear Take shower not bath Cranberry juice, yogurt Force fluids AZO over the counter X2 days Culture pending RTO prn

## 2024-01-30 LAB — URINE CULTURE

## 2024-01-31 ENCOUNTER — Telehealth: Payer: Self-pay | Admitting: Cardiology

## 2024-01-31 NOTE — Telephone Encounter (Signed)
 Patient identification verified by 2 forms. Emily Rail, RN    Called and spoke to patient and daughter Emily Wagner  Patient/[name] states:   -she has chest pain when she exerts herself   -it is not continuous   -chest pain worsened recently   -last episode of chest pain was a few weeks ago   -sometimes has had a hard time catch her breath   -shortness of breath worsening   -Sister recently passed away 2024/01/12  -BP has been elevated at previous doctors visit  Per 3/3 PCP visit BP 163/111 RN offered to scheduled OV 3/10 with NP Cleaver  Patient declined OV, requests appointment with Dr. Antoine Poche  RN encouraged APP appointment regarding symptoms, patient declined  RN reviewed ED warning signs/precautions  Patient/[name] verbalized understanding, no questions at this time

## 2024-01-31 NOTE — Telephone Encounter (Signed)
   Pt c/o of Chest Pain: STAT if active CP, including tightness, pressure, jaw pain, radiating pain to shoulder/upper arm/back, CP unrelieved by Nitro. Symptoms reported of SOB, nausea, vomiting, sweating.  1. Are you having CP right now? No     2. Are you experiencing any other symptoms (ex. SOB, nausea, vomiting, sweating)? She states sometimes she gets SOB but nothing currently   3. Is your CP continuous or coming and going? Coming and going    4. Have you taken Nitroglycerin? No    5. How long have you been experiencing CP? Only when she gets exerted or excited she states     6. If NO CP at time of call then end call with telling Pt to call back or call 911 if Chest pain returns prior to return call from triage team.

## 2024-02-04 ENCOUNTER — Ambulatory Visit: Admitting: General Practice

## 2024-02-04 NOTE — Telephone Encounter (Signed)
 Patient is very HOH. Able to let her know that she has an appointment for this Wednesday at 10am in the [place] office. She voiced understanding. Attempted to call her daughter's phone number on the chart and got a recording that phone was no longer in service.

## 2024-02-05 NOTE — Telephone Encounter (Signed)
 Patient is requesting call back. She states she is suppose to have an appt tomorrow at 10 a.m., but states that she was told this has yet to be scheduled. She is requesting call back to discuss conflicting time. Please advise.

## 2024-02-05 NOTE — Telephone Encounter (Signed)
 Patient identification verified by 2 forms. Marilynn Rail, RN    Called and spoke to patient  Informed patient:  -3/11 OV not available   -OV scheduled for 3/26 at [facility] office at 10:40am  Patient verbalized understanding, no questions at this time

## 2024-02-06 ENCOUNTER — Telehealth: Payer: Self-pay | Admitting: Family Medicine

## 2024-02-06 DIAGNOSIS — S32592A Other specified fracture of left pubis, initial encounter for closed fracture: Secondary | ICD-10-CM

## 2024-02-06 DIAGNOSIS — M25552 Pain in left hip: Secondary | ICD-10-CM

## 2024-02-06 NOTE — Telephone Encounter (Signed)
 Copied from CRM [id number]. Topic: General - Other >> Feb 06, 2024 11:44 AM Fonda Kinder J wrote: Reason for CRM: Pts daughter wants to know if the paperwork she left for Dr.[name] on 03/05 had been given to him and completed? She is requesting a callback at [facility] [phone]

## 2024-02-06 NOTE — Telephone Encounter (Signed)
 [email or web address]

## 2024-02-07 NOTE — Telephone Encounter (Signed)
 New order for [facility] PT will need to be placed with a comment to send the order to [facility] Would also like order to include an aide if possible

## 2024-02-11 ENCOUNTER — Telehealth: Payer: Self-pay | Admitting: Cardiology

## 2024-02-11 MED ORDER — DILTIAZEM HCL 30 MG PO TABS: 30.0000 mg | ORAL_TABLET | Freq: Two times a day (BID) | ORAL | 0 refills | Status: DC

## 2024-02-11 NOTE — Telephone Encounter (Signed)
 Yes go ahead and place new home health order and including aide

## 2024-02-11 NOTE — Telephone Encounter (Signed)
 Pt's medication was sent to pt's pharmacy as requested. Confirmation received.

## 2024-02-11 NOTE — Telephone Encounter (Signed)
*  STAT* If patient is at the pharmacy, call can be transferred to refill team.   1. Which medications need to be refilled? (please list name of each medication and dose if known)   diltiazem (CARDIZEM) 30 MG tablet    4. Which pharmacy/location (including street and city if local pharmacy) is medication to be sent to? [id number] - [place], [place] - [address]     5. Do they need a 30 day or 90 day supply? 90   Pt completely out, scheduled for 02/20/24

## 2024-02-12 NOTE — Telephone Encounter (Deleted)
Please complete pending order and sign.

## 2024-02-12 NOTE — Addendum Note (Signed)
 Addended by: Julious Payer D on: 02/12/2024 09:31 AM   Modules accepted: Orders

## 2024-02-14 ENCOUNTER — Telehealth: Payer: Self-pay | Admitting: Family Medicine

## 2024-02-14 NOTE — Telephone Encounter (Signed)
 Copied from CRM [id number]. Topic: General - Other >> Feb 14, 2024  9:14 AM Macon Large wrote: Reason for CRM: Patient's daughter Rudi Heap called for an update on the form left for [facility]. Aurther Loft stated that she has not heard back from [facility] nor [facility] regarding the home health request. Call back# [phone]

## 2024-02-15 ENCOUNTER — Emergency Department (HOSPITAL_BASED_OUTPATIENT_CLINIC_OR_DEPARTMENT_OTHER)
Admission: EM | Admit: 2024-02-15 | Discharge: 2024-02-15 | Disposition: A | Discharge status: Home / Self Care | Attending: Emergency Medicine | Admitting: Emergency Medicine

## 2024-02-15 ENCOUNTER — Ambulatory Visit: Payer: Self-pay

## 2024-02-15 ENCOUNTER — Other Ambulatory Visit: Payer: Self-pay

## 2024-02-15 ENCOUNTER — Emergency Department (HOSPITAL_BASED_OUTPATIENT_CLINIC_OR_DEPARTMENT_OTHER): Admitting: Radiology

## 2024-02-15 ENCOUNTER — Encounter (HOSPITAL_BASED_OUTPATIENT_CLINIC_OR_DEPARTMENT_OTHER): Payer: Self-pay | Admitting: Emergency Medicine

## 2024-02-15 DIAGNOSIS — E039 Hypothyroidism, unspecified: Secondary | ICD-10-CM | POA: Insufficient documentation

## 2024-02-15 DIAGNOSIS — I10 Essential (primary) hypertension: Secondary | ICD-10-CM | POA: Insufficient documentation

## 2024-02-15 DIAGNOSIS — Z7901 Long term (current) use of anticoagulants: Secondary | ICD-10-CM | POA: Insufficient documentation

## 2024-02-15 DIAGNOSIS — J90 Pleural effusion, not elsewhere classified: Secondary | ICD-10-CM | POA: Diagnosis not present

## 2024-02-15 DIAGNOSIS — R000 Tachycardia, unspecified: Secondary | ICD-10-CM | POA: Insufficient documentation

## 2024-02-15 DIAGNOSIS — Z79899 Other long term (current) drug therapy: Secondary | ICD-10-CM | POA: Insufficient documentation

## 2024-02-15 DIAGNOSIS — Z79890 Hormone replacement therapy (postmenopausal): Secondary | ICD-10-CM | POA: Diagnosis not present

## 2024-02-15 DIAGNOSIS — Z951 Presence of aortocoronary bypass graft: Secondary | ICD-10-CM | POA: Insufficient documentation

## 2024-02-15 DIAGNOSIS — Z8673 Personal history of transient ischemic attack (TIA), and cerebral infarction without residual deficits: Secondary | ICD-10-CM | POA: Insufficient documentation

## 2024-02-15 DIAGNOSIS — E860 Dehydration: Secondary | ICD-10-CM | POA: Diagnosis not present

## 2024-02-15 DIAGNOSIS — R918 Other nonspecific abnormal finding of lung field: Secondary | ICD-10-CM | POA: Diagnosis not present

## 2024-02-15 DIAGNOSIS — N3000 Acute cystitis without hematuria: Secondary | ICD-10-CM | POA: Diagnosis not present

## 2024-02-15 DIAGNOSIS — I2510 Atherosclerotic heart disease of native coronary artery without angina pectoris: Secondary | ICD-10-CM | POA: Insufficient documentation

## 2024-02-15 DIAGNOSIS — J984 Other disorders of lung: Secondary | ICD-10-CM | POA: Diagnosis not present

## 2024-02-15 DIAGNOSIS — R5383 Other fatigue: Secondary | ICD-10-CM | POA: Insufficient documentation

## 2024-02-15 DIAGNOSIS — M25552 Pain in left hip: Secondary | ICD-10-CM | POA: Diagnosis not present

## 2024-02-15 DIAGNOSIS — R0602 Shortness of breath: Secondary | ICD-10-CM | POA: Diagnosis not present

## 2024-02-15 DIAGNOSIS — R531 Weakness: Secondary | ICD-10-CM | POA: Diagnosis not present

## 2024-02-15 LAB — TSH: TSH: 1.861 u[IU]/mL (ref 0.350–4.500)

## 2024-02-15 LAB — URINALYSIS, ROUTINE W REFLEX MICROSCOPIC
Bilirubin Urine: NEGATIVE
Glucose, UA: NEGATIVE mg/dL
Hgb urine dipstick: NEGATIVE
Ketones, ur: NEGATIVE mg/dL
Nitrite: NEGATIVE
Protein, ur: 30 mg/dL — AB
Specific Gravity, Urine: 1.018 (ref 1.005–1.030)
pH: 6.5 (ref 5.0–8.0)

## 2024-02-15 LAB — HEPATIC FUNCTION PANEL
ALT: 8 U/L (ref 0–44)
AST: 18 U/L (ref 15–41)
Albumin: 4 g/dL (ref 3.5–5.0)
Alkaline Phosphatase: 107 U/L (ref 38–126)
Bilirubin, Direct: 0.2 mg/dL (ref 0.0–0.2)
Indirect Bilirubin: 1.1 mg/dL — ABNORMAL HIGH (ref 0.3–0.9)
Total Bilirubin: 1.3 mg/dL — ABNORMAL HIGH (ref 0.0–1.2)
Total Protein: 7 g/dL (ref 6.5–8.1)

## 2024-02-15 LAB — MAGNESIUM: Magnesium: 2 mg/dL (ref 1.7–2.4)

## 2024-02-15 LAB — CBC
HCT: 36.1 % (ref 36.0–46.0)
Hemoglobin: 11.6 g/dL — ABNORMAL LOW (ref 12.0–15.0)
MCH: 29.7 pg (ref 26.0–34.0)
MCHC: 32.1 g/dL (ref 30.0–36.0)
MCV: 92.6 fL (ref 80.0–100.0)
Platelets: 246 10*3/uL (ref 150–400)
RBC: 3.9 MIL/uL (ref 3.87–5.11)
RDW: 18.5 % — ABNORMAL HIGH (ref 11.5–15.5)
WBC: 6.7 10*3/uL (ref 4.0–10.5)
nRBC: 0 % (ref 0.0–0.2)

## 2024-02-15 LAB — BASIC METABOLIC PANEL
Anion gap: 9 (ref 5–15)
BUN: 23 mg/dL (ref 8–23)
CO2: 26 mmol/L (ref 22–32)
Calcium: 9.8 mg/dL (ref 8.9–10.3)
Chloride: 105 mmol/L (ref 98–111)
Creatinine, Ser: 0.9 mg/dL (ref 0.44–1.00)
GFR, Estimated: >60 mL/min (ref >60)
Glucose, Bld: 104 mg/dL — ABNORMAL HIGH (ref 70–99)
Potassium: 4.2 mmol/L (ref 3.5–5.1)
Sodium: 140 mmol/L (ref 135–145)

## 2024-02-15 LAB — LIPASE, BLOOD: Lipase: 25 U/L (ref 11–51)

## 2024-02-15 LAB — DIGOXIN LEVEL: Digoxin Level: 0.7 ng/mL — ABNORMAL LOW (ref 0.8–2.0)

## 2024-02-15 LAB — CBG MONITORING, ED: Glucose-Capillary: 86 mg/dL (ref 70–99)

## 2024-02-15 LAB — TROPONIN I (HIGH SENSITIVITY)
Troponin I (High Sensitivity): 39 ng/L — ABNORMAL HIGH (ref <18)
Troponin I (High Sensitivity): 42 ng/L — ABNORMAL HIGH (ref <18)

## 2024-02-15 MED ORDER — DILTIAZEM HCL 25 MG/5ML IV SOLN
10.0000 mg | Freq: Once | INTRAVENOUS | Status: AC
  Administered 2024-02-15: 10 mg via INTRAVENOUS
  Filled 2024-02-15: qty 5

## 2024-02-15 MED ORDER — DILTIAZEM HCL 30 MG PO TABS
30.0000 mg | ORAL_TABLET | Freq: Once | ORAL | Status: AC
  Administered 2024-02-15: 30 mg via ORAL
  Filled 2024-02-15: qty 1

## 2024-02-15 MED ORDER — CEPHALEXIN 500 MG PO CAPS: 500.0000 mg | ORAL_CAPSULE | Freq: Four times a day (QID) | ORAL | 0 refills | Status: DC

## 2024-02-15 MED ORDER — SODIUM CHLORIDE 0.9 % IV BOLUS
500.0000 mL | Freq: Once | INTRAVENOUS | Status: AC
  Administered 2024-02-15: 500 mL via INTRAVENOUS

## 2024-02-15 NOTE — ED Triage Notes (Signed)
 Pt via [facility] from home; daughter reported to EMS that pt has been unable/unwilling to get out of bed for a few days after being weak x 1 week.Pt has not been eating well. Pt alert & has some dementia; oriented to self time. NAD noted. Daughter en route.

## 2024-02-15 NOTE — Telephone Encounter (Signed)
  Chief Complaint: weakness, elevated heart rate, dehydration Symptoms: weakness, dizziness Frequency: constant Pertinent Negatives: Patient denies all others  Disposition: [x] ED /[] Urgent Care (no appt availability in office) / [] Appointment(In office/virtual)/ []  [facility]/ [] Home Care/ [] Refused Recommended Disposition /[]  Mobile Bus/ []  Follow-up with PCP Additional Notes:  Patient daughter Emily Wagner calling in to set up same day visit for blood work as advised by EMTs. EMT is currently in the home after family called 911 for extreme weakness. Blood pressure 153/98, HR 116-130 via EMS. Daughter Emily Wagner reports Emily Wagner is very dehydrated. Advised emergency room evaluation and treatment, daughter reports they would like to come to [place] for blood work and Emily Wagner will encourage fluids.  This Educated daughter on need for ER evaluation and treatment, she relayed this education to [place] who is in agreement with plan, she will be transported to ER via EMS.   Copied from CRM [id number]. Topic: Clinical - Red Word Triage >> Feb 15, 2024 11:39 AM Emily Wagner wrote: Red Word that prompted transfer to Nurse Triage: EMS is at the house with the patient.The patient is very weak and dehydrated and may need blood work Reason for Disposition  [1] SEVERE weakness (i.e., unable to walk or barely able to walk, requires support) AND [2] new-onset or worsening  Protocols used: Weakness (Generalized) and Fatigue-A-AH

## 2024-02-15 NOTE — Telephone Encounter (Signed)
 This was to go to [facility] in [place] requested by patient who checked with her insurance.

## 2024-02-15 NOTE — Telephone Encounter (Signed)
 Please see message below from Monia Pouch with [facility].  [place], Wanita Chamberlain, Dalbert Batman,   Spoke to [facility] and they said they could see. Reached out to the family as we are active with others. Reporting they are about to take her to the hospital. Will follow and see if she is admitted. We should be able to see, in the meantime, no Aide available but asked if Dr [name] could add a SW to assist in possible getting some community assistance.

## 2024-02-15 NOTE — Telephone Encounter (Signed)
 I am not sure what form Patient is referencing, but as far as the Home Health, I am trying to get Patient accepted at this time.

## 2024-02-15 NOTE — ED Provider Notes (Addendum)
  Physical Exam  BP (!) 148/107 (BP Location: Left Arm)   Pulse (!) 107   Temp 98.2 F (36.8 C) (Oral)   Resp 18   Ht 5\' 4"  (1.626 m)   Wt 53.1 kg   LMP 03/21/1971   SpO2 96%   BMI 20.09 kg/m   Physical Exam  Procedures  Procedures  ED Course / MDM    Medical Decision Making Amount and/or Complexity of Data Reviewed Labs: ordered. Radiology: ordered.  Risk Prescription drug management.   Wardell Honour, assumed care of this patient.  In brief this an 89 year old female came in today for fatigue.  She was noted to have increased heart rate, corrected in the emergency room.  Prior team was concerned about UTI he was planning to discharge with antibiotics.  Patient feels well, has been ambulatory in the emergency department without complaints.  Will discharge on Keflex.   Reassessment 750 p.m.-patient had a repeat episode of RVR.  Gave her some additional diltiazem, rate was then well-controlled.  Patient was able to ambulate without any difficulty.  Offered patient, or patient wishes to return home.  I believe that this is reasonable.  Return precaution discussed with the patient.      Anders Simmonds T, DO 02/15/24 1736    Anders Simmonds T, DO 02/15/24 [phone]

## 2024-02-15 NOTE — ED Triage Notes (Signed)
 Per ems  Generalized weakness x 1 week. Won't get out of bed or eat today. Referred by PCP for dehydration eval/possibly fluids and blood work.  155/99 hr 99 O2 sat 97 CBG 122

## 2024-02-15 NOTE — Discharge Instructions (Signed)
 You can take Keflex 4 times per day for the next 1 week.  Please follow-up with your primary care doctor early next week.  Return to the emergency room if you develop fever, confusion or shortness of breath.

## 2024-02-15 NOTE — ED Notes (Signed)
 Pt was able to walk to the bathroom and back without assistance and without any complaints.

## 2024-02-15 NOTE — Telephone Encounter (Signed)
 Talked w/ Florentina Addison at [facility] giving VO to add SW.

## 2024-02-15 NOTE — ED Provider Notes (Signed)
 [place] EMERGENCY DEPARTMENT AT [facility] Provider Note   CSN: [id number] Arrival date & time: 02/15/24  1248     History  Chief Complaint  Patient presents with   Weakness    Emily Wagner is a 89 y.o. female.  The history is provided by the patient and medical records. No language interpreter was used.  Weakness Severity:  Severe Onset quality:  Gradual Duration:  1 week Timing:  Constant Progression:  Waxing and waning Chronicity:  New Context: not stress   Relieved by:  Nothing Worsened by:  Nothing Ineffective treatments:  None tried Associated symptoms: cough and shortness of breath   Associated symptoms: no abdominal pain, no chest pain, no diarrhea, no drooling, no dysuria, no numbness in extremities, no falls, no fever, no frequency, no headaches, no loss of consciousness, no nausea, no near-syncope, no sensory-motor deficit, no vision change and no vomiting   Associated symptoms comment:  Darker urine and less urine      Home Medications Prior to Admission medications   Medication Sig Start Date End Date Taking? Authorizing Provider  acetaminophen (TYLENOL) 325 MG tablet Take 325 mg by mouth every 6 (six) hours as needed.    [provider]  ALPRAZolam Prudy Feeler) 0.5 MG tablet Take 1 tablet (0.5 mg total) by mouth at bedtime as needed for anxiety (MRI). Pt may take 1 tablet 30 min prior to MRI and then repeat if needed at the time of the MRI. (MUST have a driver) 03/03/95   Micki Riley, MD  apixaban (ELIQUIS) 2.5 MG TABS tablet Take 1 tablet (2.5 mg total) by mouth 2 (two) times daily. 10/13/22   [name], MD  cholecalciferol (VITAMIN D3) 25 MCG (1000 UNIT) tablet Take 1,000 Units by mouth daily.    [provider]  Cyanocobalamin (VITAMIN B-12 PO) Take 500 mcg by mouth. Take one tablet by mouth on Mon.,Wed., and Friday    [provider]  digoxin (LANOXIN) 0.125 MG tablet TAKE 1 TABLET BY MOUTH DAILY 08/02/23    [name], MD  diltiazem (CARDIZEM) 30 MG tablet Take 1 tablet (30 mg total) by mouth 2 (two) times daily. 02/11/24   [name], MD  doxycycline (VIBRA-TABS) 100 MG tablet Take 1 tablet (100 mg total) by mouth 2 (two) times daily. 1 po bid 01/28/24   Bennie Pierini, FNP  famotidine (PEPCID) 20 MG tablet TAKE 1 TABLET BY MOUTH TWICE A DAY 01/28/24   [name], Elige Radon, MD  levothyroxine (SYNTHROID) 50 MCG tablet Take 1 tablet (50 mcg total) by mouth daily before breakfast. 12/17/23   [name], Elige Radon, MD  lidocaine (LIDODERM) 5 % Place 1 patch onto the skin daily. Remove & Discard patch within 12 hours or as directed by MD 12/11/23   Gloris Manchester, MD  methocarbamol (ROBAXIN) 500 MG tablet Take 1 tablet (500 mg total) by mouth every 12 (twelve) hours as needed for up to 12 doses for muscle spasms. 12/11/23   Gloris Manchester, MD  Multiple Vitamins-Minerals (PRESERVISION AREDS 2 PO) Take 1 capsule by mouth 2 (two) times daily.    [provider]  PARoxetine (PAXIL) 20 MG tablet Take 1 tablet (20 mg total) by mouth daily. 01/04/24   [name], Elige Radon, MD  QUEtiapine (SEROQUEL) 50 MG tablet TAKE 1 TABLET BY MOUTH EVERYDAY AT BEDTIME 01/28/24   [name], Elige Radon, MD  traMADol (ULTRAM) 50 MG tablet Take 1 tablet (50 mg total) by mouth every 8 (eight) hours as  needed. 01/04/24   [name], Elige Radon, MD      Allergies    Nitrofurantoin, Nsaids, Other, Beta adrenergic blockers, Flonase [fluticasone propionate], Prevnar 13 [pneumococcal 13-val conj vacc], Propranolol hcl, Hydrocodone, Alendronate sodium, Ciprofloxacin, Codeine, Crestor [rosuvastatin calcium], Darvon, Lipitor [atorvastatin calcium], Nortriptyline hcl, Potassium-containing compounds, Pravachol, Sulfa antibiotics, Sulfamethoxazole, Ultram [tramadol hcl], Welchol [colesevelam hcl], Zetia [ezetimibe], and Zocor [simvastatin]    Review of Systems   Review of Systems  Constitutional:  Positive for fatigue. Negative for chills and  fever.  HENT:  Negative for congestion and drooling.   Respiratory:  Positive for cough and shortness of breath. Negative for chest tightness and wheezing.   Cardiovascular:  Positive for palpitations. Negative for chest pain, leg swelling and near-syncope.  Gastrointestinal:  Negative for abdominal pain, constipation, diarrhea, nausea and vomiting.  Genitourinary:  Positive for decreased urine volume. Negative for dysuria and frequency.  Musculoskeletal:  Negative for back pain, falls, neck pain and neck stiffness.  Skin:  Negative for rash.  Neurological:  Positive for weakness. Negative for loss of consciousness and headaches.  Psychiatric/Behavioral:  Negative for agitation and confusion.   All other systems reviewed and are negative.   Physical Exam Updated Vital Signs BP (!) 133/122 (BP Location: Right Arm)   Pulse (!) 107   Temp 98.1 F (36.7 C)   Resp 20   Ht 5\' 4"  (1.626 m)   Wt 53.1 kg   LMP 03/21/1971   SpO2 98%   BMI 20.09 kg/m  Physical Exam Vitals and nursing note reviewed.  Constitutional:      General: She is not in acute distress.    Appearance: She is well-developed. She is not ill-appearing, toxic-appearing or diaphoretic.  HENT:     Head: Normocephalic and atraumatic.     Nose: No congestion or rhinorrhea.     Mouth/Throat:     Pharynx: No oropharyngeal exudate or posterior oropharyngeal erythema.  Eyes:     Extraocular Movements: Extraocular movements intact.     Conjunctiva/sclera: Conjunctivae normal.     Pupils: Pupils are equal, round, and reactive to light.  Cardiovascular:     Rate and Rhythm: Tachycardia present. Rhythm irregular.     Heart sounds: No murmur heard. Pulmonary:     Effort: Pulmonary effort is normal. No respiratory distress.     Breath sounds: Normal breath sounds. No wheezing, rhonchi or rales.  Chest:     Chest wall: No tenderness.  Abdominal:     General: Abdomen is flat.     Palpations: Abdomen is soft.     Tenderness:  There is no abdominal tenderness. There is no guarding or rebound.  Musculoskeletal:        General: No swelling or tenderness.     Cervical back: Neck supple.     Right lower leg: No edema.     Left lower leg: No edema.  Skin:    General: Skin is warm and dry.     Capillary Refill: Capillary refill takes less than 2 seconds.     Findings: No erythema or rash.  Neurological:     General: No focal deficit present.     Mental Status: She is alert.  Psychiatric:        Mood and Affect: Mood normal.     ED Results / Procedures / Treatments   Labs (all labs ordered are listed, but only abnormal results are displayed) Labs Reviewed  BASIC METABOLIC PANEL - Abnormal; Notable for the following components:  Result Value   Glucose, Bld 104 (*)    All other components within normal limits  CBC - Abnormal; Notable for the following components:   Hemoglobin 11.6 (*)    RDW 18.5 (*)    All other components within normal limits  URINALYSIS, ROUTINE W REFLEX MICROSCOPIC - Abnormal; Notable for the following components:   Protein, ur 30 (*)    Leukocytes,Ua SMALL (*)    Bacteria, UA RARE (*)    All other components within normal limits  HEPATIC FUNCTION PANEL - Abnormal; Notable for the following components:   Total Bilirubin 1.3 (*)    Indirect Bilirubin 1.1 (*)    All other components within normal limits  TROPONIN I (HIGH SENSITIVITY) - Abnormal; Notable for the following components:   Troponin I (High Sensitivity) 39 (*)    All other components within normal limits  MAGNESIUM  LIPASE, BLOOD  TSH  CBG MONITORING, ED  TROPONIN I (HIGH SENSITIVITY)    EKG EKG Interpretation Date/Time:  Friday February 15 2024 13:07:18 EDT Ventricular Rate:  123 PR Interval:    QRS Duration:  112 QT Interval:  340 QTC Calculation: 486 R Axis:   -16  Text Interpretation: Atrial fibrillation with rapid ventricular response Anterior infarct , age undetermined Abnormal ECG When compared with  ECG of 11-Dec-2023 14:11, PREVIOUS ECG IS PRESENT when comapred to prior, similar afib with faster rate No STEMI Confirmed by Theda Belfast ([id number]) on 02/15/2024 1:21:50 PM  Radiology No results found.  Procedures Procedures    Medications Ordered in ED Medications  sodium chloride 0.9 % bolus 500 mL (500 mLs Intravenous New Bag/Given 02/15/24 1438)    ED Course/ Medical Decision Making/ A&P                                 Medical Decision Making Amount and/or Complexity of Data Reviewed Labs: ordered. Radiology: ordered.    Emily Wagner is a 89 y.o. female with a past medical history significant for CAD status post CABG, hypertension, hypercholesterolemia, postpolio syndrome, hypothyroidism, atrial fibrillation on Eliquis therapy, previous stroke, previous urinary tract infections, and osteoporosis who presents with worsening fatigue.  According to patient, she has had less energy over the last week or 2 and has not been able to get out of bed.  She reports she has had decreased oral intake and is having some shortness of breath and exertional shortness of breath.  She reports very dry cough but denies any chest pain.  Denies persistent palpitations but occasionally has a sensation of fast heartbeat.  She reports no new leg pain or leg swelling and her urine has been decreased and darker.  She denies any trauma.  Denies any new falls.  She denies any constipation or diarrhea.  On exam, lungs were overall clear.  No rhonchi.  No wheezing.  Chest nontender, abdomen nontender.  Legs nonedematous.  Patient heart rate is going about 120 and appears to show A-fib with RVR.  Patient has dry mucous membranes.  EKG shows A-fib with RVR but no STEMI.  Clinically I suspect dehydration over the last week with decreased oral intake and no energy.  Will check screening labs and get workup to look for occult infection.  Given her report that she is too tired to get out of bed and is not eating  or drinking, anticipate she may need need admission if workup shows any occult infections  or other concerning findings.  Urinalysis does show evidence of leukocytes and bacteria, given the urinary changes suspect she may have a UTI.  Her white count was not elevated and she has mild anemia.  Creatinine is normal.  Liver function not critically elevated.  Troponin is slightly elevated at 39, will trend.  Otherwise TSH still in process and x-ray still waiting to be read by radiology.  Given the patient's age, decreased oral intake, A-fib with RVR, and suspected new UTI, anticipate admission.  Will have a shared decision-making conversation with patient about management.  3:17 PM Patient tells me she does not want to be admitted.  Will try to have her ambulate, will p.o. challenge her, and will wait for her x-ray results.  If she continues to feel well, passes p.o. challenge, and x-ray does not show large pneumonia, anticipate she may be stable for discharge and outpatient follow-up with antibiotics to cover for her UTI.         Final Clinical Impression(s) / ED Diagnoses Final diagnoses:  Fatigue, unspecified type  Dehydration  Acute cystitis without hematuria    Clinical Impression: 1. Fatigue, unspecified type   2. Dehydration   3. Acute cystitis without hematuria     Disposition: Care transferred oncoming team to wait for results of x-ray and reassessment after p.o. challenge.  Anticipate discharge if patient is feeling better and is able to safely ambulate and orally hydrate.  This note was prepared with assistance of Conservation officer, historic buildings. Occasional wrong-word or sound-a-like substitutions may have occurred due to the inherent limitations of voice recognition software.      Kaylan Friedmann, Canary Brim, MD 02/15/24 1537

## 2024-02-17 ENCOUNTER — Encounter (HOSPITAL_COMMUNITY): Payer: Self-pay

## 2024-02-17 ENCOUNTER — Other Ambulatory Visit: Payer: Self-pay

## 2024-02-17 ENCOUNTER — Emergency Department (HOSPITAL_COMMUNITY)

## 2024-02-17 ENCOUNTER — Observation Stay (HOSPITAL_COMMUNITY)
Admission: EM | Admit: 2024-02-17 | Discharge: 2024-02-20 | Disposition: A | Discharge status: Home / Self Care | Attending: Internal Medicine | Admitting: Internal Medicine

## 2024-02-17 DIAGNOSIS — D500 Iron deficiency anemia secondary to blood loss (chronic): Secondary | ICD-10-CM | POA: Insufficient documentation

## 2024-02-17 DIAGNOSIS — J9811 Atelectasis: Secondary | ICD-10-CM | POA: Diagnosis not present

## 2024-02-17 DIAGNOSIS — N179 Acute kidney failure, unspecified: Secondary | ICD-10-CM

## 2024-02-17 DIAGNOSIS — I5021 Acute systolic (congestive) heart failure: Secondary | ICD-10-CM

## 2024-02-17 DIAGNOSIS — I2510 Atherosclerotic heart disease of native coronary artery without angina pectoris: Secondary | ICD-10-CM | POA: Diagnosis present

## 2024-02-17 DIAGNOSIS — Z8673 Personal history of transient ischemic attack (TIA), and cerebral infarction without residual deficits: Secondary | ICD-10-CM

## 2024-02-17 DIAGNOSIS — Z79899 Other long term (current) drug therapy: Secondary | ICD-10-CM | POA: Insufficient documentation

## 2024-02-17 DIAGNOSIS — I110 Hypertensive heart disease with heart failure: Secondary | ICD-10-CM | POA: Insufficient documentation

## 2024-02-17 DIAGNOSIS — D649 Anemia, unspecified: Secondary | ICD-10-CM | POA: Insufficient documentation

## 2024-02-17 DIAGNOSIS — E039 Hypothyroidism, unspecified: Secondary | ICD-10-CM | POA: Diagnosis present

## 2024-02-17 DIAGNOSIS — R410 Disorientation, unspecified: Secondary | ICD-10-CM | POA: Diagnosis not present

## 2024-02-17 DIAGNOSIS — N390 Urinary tract infection, site not specified: Secondary | ICD-10-CM | POA: Insufficient documentation

## 2024-02-17 DIAGNOSIS — I5033 Acute on chronic diastolic (congestive) heart failure: Secondary | ICD-10-CM | POA: Diagnosis not present

## 2024-02-17 DIAGNOSIS — I5032 Chronic diastolic (congestive) heart failure: Secondary | ICD-10-CM | POA: Insufficient documentation

## 2024-02-17 DIAGNOSIS — J90 Pleural effusion, not elsewhere classified: Secondary | ICD-10-CM | POA: Diagnosis not present

## 2024-02-17 DIAGNOSIS — I4891 Unspecified atrial fibrillation: Principal | ICD-10-CM | POA: Diagnosis present

## 2024-02-17 DIAGNOSIS — F4323 Adjustment disorder with mixed anxiety and depressed mood: Secondary | ICD-10-CM | POA: Diagnosis present

## 2024-02-17 DIAGNOSIS — I10 Essential (primary) hypertension: Secondary | ICD-10-CM | POA: Diagnosis present

## 2024-02-17 LAB — CBC
HCT: 33.8 % — ABNORMAL LOW (ref 36.0–46.0)
Hemoglobin: 10.7 g/dL — ABNORMAL LOW (ref 12.0–15.0)
MCH: 30 pg (ref 26.0–34.0)
MCHC: 31.7 g/dL (ref 30.0–36.0)
MCV: 94.7 fL (ref 80.0–100.0)
Platelets: 236 10*3/uL (ref 150–400)
RBC: 3.57 MIL/uL — ABNORMAL LOW (ref 3.87–5.11)
RDW: 18.7 % — ABNORMAL HIGH (ref 11.5–15.5)
WBC: 8.2 10*3/uL (ref 4.0–10.5)
nRBC: 0 % (ref 0.0–0.2)

## 2024-02-17 LAB — IRON AND TIBC
Iron: 42 ug/dL (ref 28–170)
Saturation Ratios: 15 % (ref 10.4–31.8)
TIBC: 274 ug/dL (ref 250–450)
UIBC: 232 ug/dL

## 2024-02-17 LAB — BASIC METABOLIC PANEL
Anion gap: 10 (ref 5–15)
BUN: 21 mg/dL (ref 8–23)
CO2: 24 mmol/L (ref 22–32)
Calcium: 8.9 mg/dL (ref 8.9–10.3)
Chloride: 106 mmol/L (ref 98–111)
Creatinine, Ser: 0.93 mg/dL (ref 0.44–1.00)
GFR, Estimated: 59 mL/min — ABNORMAL LOW (ref >60)
Glucose, Bld: 106 mg/dL — ABNORMAL HIGH (ref 70–99)
Potassium: 4.3 mmol/L (ref 3.5–5.1)
Sodium: 140 mmol/L (ref 135–145)

## 2024-02-17 LAB — URINALYSIS, ROUTINE W REFLEX MICROSCOPIC
Bilirubin Urine: NEGATIVE
Glucose, UA: NEGATIVE mg/dL
Hgb urine dipstick: NEGATIVE
Ketones, ur: NEGATIVE mg/dL
Nitrite: NEGATIVE
Protein, ur: 30 mg/dL — AB
Specific Gravity, Urine: 1.025 (ref 1.005–1.030)
pH: 6 (ref 5.0–8.0)

## 2024-02-17 LAB — DIGOXIN LEVEL: Digoxin Level: 1.3 ng/mL (ref 0.8–2.0)

## 2024-02-17 LAB — FERRITIN: Ferritin: 54 ng/mL (ref 11–307)

## 2024-02-17 LAB — TROPONIN I (HIGH SENSITIVITY)
Troponin I (High Sensitivity): 41 ng/L — ABNORMAL HIGH (ref <18)
Troponin I (High Sensitivity): 44 ng/L — ABNORMAL HIGH (ref <18)

## 2024-02-17 LAB — VITAMIN B12: Vitamin B-12: 1691 pg/mL — ABNORMAL HIGH (ref 180–914)

## 2024-02-17 LAB — URINALYSIS, MICROSCOPIC (REFLEX)

## 2024-02-17 LAB — MAGNESIUM: Magnesium: 1.7 mg/dL (ref 1.7–2.4)

## 2024-02-17 LAB — BRAIN NATRIURETIC PEPTIDE: B Natriuretic Peptide: 896.7 pg/mL — ABNORMAL HIGH (ref 0.0–100.0)

## 2024-02-17 MED ORDER — ACETAMINOPHEN 650 MG RE SUPP: 650.0000 mg | Freq: Four times a day (QID) | RECTAL | Status: DC | PRN

## 2024-02-17 MED ORDER — DILTIAZEM HCL 30 MG PO TABS
30.0000 mg | ORAL_TABLET | Freq: Two times a day (BID) | ORAL | Status: DC
  Administered 2024-02-17 – 2024-02-18 (×2): 30 mg via ORAL
  Filled 2024-02-17 (×2): qty 1

## 2024-02-17 MED ORDER — ACETAMINOPHEN 325 MG PO TABS
650.0000 mg | ORAL_TABLET | Freq: Four times a day (QID) | ORAL | Status: DC | PRN
  Administered 2024-02-18: 650 mg via ORAL
  Filled 2024-02-17: qty 2

## 2024-02-17 MED ORDER — QUETIAPINE FUMARATE 50 MG PO TABS
50.0000 mg | ORAL_TABLET | Freq: Every day | ORAL | Status: DC
  Administered 2024-02-17 – 2024-02-19 (×3): 50 mg via ORAL
  Filled 2024-02-17 (×3): qty 1

## 2024-02-17 MED ORDER — CEPHALEXIN 500 MG PO CAPS
500.0000 mg | ORAL_CAPSULE | Freq: Four times a day (QID) | ORAL | Status: DC
  Administered 2024-02-17 – 2024-02-18 (×3): 500 mg via ORAL
  Filled 2024-02-17: qty 1
  Filled 2024-02-17: qty 2
  Filled 2024-02-17 (×3): qty 1

## 2024-02-17 MED ORDER — APIXABAN 2.5 MG PO TABS
2.5000 mg | ORAL_TABLET | Freq: Two times a day (BID) | ORAL | Status: DC
  Administered 2024-02-17 – 2024-02-20 (×6): 2.5 mg via ORAL
  Filled 2024-02-17 (×6): qty 1

## 2024-02-17 MED ORDER — FAMOTIDINE 20 MG PO TABS
20.0000 mg | ORAL_TABLET | Freq: Every day | ORAL | Status: DC
  Administered 2024-02-17 – 2024-02-19 (×3): 20 mg via ORAL
  Filled 2024-02-17 (×3): qty 1

## 2024-02-17 MED ORDER — PRESERVISION AREDS 2 PO CAPS: 1.0000 | ORAL_CAPSULE | Freq: Two times a day (BID) | ORAL | Status: DC

## 2024-02-17 MED ORDER — DIGOXIN 125 MCG PO TABS: 125.0000 ug | ORAL_TABLET | ORAL | Status: DC

## 2024-02-17 MED ORDER — DILTIAZEM HCL-DEXTROSE 125-5 MG/125ML-% IV SOLN (PREMIX)
2.5000 mg/h | INTRAVENOUS | Status: DC
  Administered 2024-02-17: 5 mg/h via INTRAVENOUS
  Administered 2024-02-18: 7.5 mg/h via INTRAVENOUS
  Filled 2024-02-17 (×2): qty 125

## 2024-02-17 MED ORDER — PAROXETINE HCL 20 MG PO TABS
20.0000 mg | ORAL_TABLET | Freq: Every morning | ORAL | Status: DC
  Administered 2024-02-17 – 2024-02-20 (×4): 20 mg via ORAL
  Filled 2024-02-17 (×5): qty 1

## 2024-02-17 MED ORDER — ORAL CARE MOUTH RINSE: 15.0000 mL | OROMUCOSAL | Status: DC | PRN

## 2024-02-17 MED ORDER — LEVOTHYROXINE SODIUM 50 MCG PO TABS
50.0000 ug | ORAL_TABLET | Freq: Every day | ORAL | Status: DC
  Administered 2024-02-18 – 2024-02-20 (×3): 50 ug via ORAL
  Filled 2024-02-17 (×3): qty 1

## 2024-02-17 MED ORDER — DIGOXIN 125 MCG PO TABS
125.0000 ug | ORAL_TABLET | Freq: Every day | ORAL | Status: DC
  Administered 2024-02-18 – 2024-02-20 (×3): 125 ug via ORAL
  Filled 2024-02-17 (×3): qty 1

## 2024-02-17 MED ORDER — DILTIAZEM HCL 30 MG PO TABS: 30.0000 mg | ORAL_TABLET | Freq: Two times a day (BID) | ORAL | Status: DC

## 2024-02-17 NOTE — Assessment & Plan Note (Signed)
 TSH wnl a few days ago Continue with synthroid daily

## 2024-02-17 NOTE — Assessment & Plan Note (Signed)
 Hx of CABG Continue medical management

## 2024-02-17 NOTE — Assessment & Plan Note (Signed)
 Slowly down trending No overt bleeding and denies bleeding  Check iron studies  Fecal occult  Trend

## 2024-02-17 NOTE — ED Provider Notes (Signed)
 [place] EMERGENCY DEPARTMENT AT [facility] Provider Note   CSN: [id number] Arrival date & time: 02/17/24  1122     History  Chief Complaint  Patient presents with   Atrial Fibrillation    Emily Wagner is a 89 y.o. female.  89 year old female with past medical history of atrial fibrillation and dementia presenting to the emergency department today after she was apparently feeling generally weak today.  The patient is currently being treated for urinary tract infection and was discharged 2 days ago.  The patient had atrial fibrillation at that time.  This was controlled medications here.  The patient elected to go home and take oral medications.  She was apparently in atrial fibrillation with RVR initially with medics.  She received 10 mg of IV diltiazem and her heart rate has improved.  She was brought to the emergency department today for further evaluation regarding this.   Atrial Fibrillation       Home Medications Prior to Admission medications   Medication Sig Start Date End Date Taking? Authorizing Provider  acetaminophen (TYLENOL) 325 MG tablet Take 325 mg by mouth every 6 (six) hours as needed.    [provider]  ALPRAZolam Prudy Feeler) 0.5 MG tablet Take 1 tablet (0.5 mg total) by mouth at bedtime as needed for anxiety (MRI). Pt may take 1 tablet 30 min prior to MRI and then repeat if needed at the time of the MRI. (MUST have a driver) 08/02/27   Micki Riley, MD  apixaban (ELIQUIS) 2.5 MG TABS tablet Take 1 tablet (2.5 mg total) by mouth 2 (two) times daily. 10/13/22   [name], MD  cephALEXin (KEFLEX) 500 MG capsule Take 1 capsule (500 mg total) by mouth 4 (four) times daily. 02/15/24   Anders Simmonds T, DO  cholecalciferol (VITAMIN D3) 25 MCG (1000 UNIT) tablet Take 1,000 Units by mouth daily.    [provider]  Cyanocobalamin (VITAMIN B-12 PO) Take 500 mcg by mouth. Take one tablet by mouth on Mon.,Wed., and Friday    [provider]  digoxin (LANOXIN) 0.125 MG tablet TAKE 1 TABLET BY MOUTH DAILY 08/02/23   [name], MD  diltiazem (CARDIZEM) 30 MG tablet Take 1 tablet (30 mg total) by mouth 2 (two) times daily. 02/11/24   [name], MD  doxycycline (VIBRA-TABS) 100 MG tablet Take 1 tablet (100 mg total) by mouth 2 (two) times daily. 1 po bid 01/28/24   Bennie Pierini, FNP  famotidine (PEPCID) 20 MG tablet TAKE 1 TABLET BY MOUTH TWICE A DAY 01/28/24   [name], Elige Radon, MD  levothyroxine (SYNTHROID) 50 MCG tablet Take 1 tablet (50 mcg total) by mouth daily before breakfast. 12/17/23   [name], Elige Radon, MD  lidocaine (LIDODERM) 5 % Place 1 patch onto the skin daily. Remove & Discard patch within 12 hours or as directed by MD 12/11/23   Gloris Manchester, MD  methocarbamol (ROBAXIN) 500 MG tablet Take 1 tablet (500 mg total) by mouth every 12 (twelve) hours as needed for up to 12 doses for muscle spasms. 12/11/23   Gloris Manchester, MD  Multiple Vitamins-Minerals (PRESERVISION AREDS 2 PO) Take 1 capsule by mouth 2 (two) times daily.    [provider]  PARoxetine (PAXIL) 20 MG tablet Take 1 tablet (20 mg total) by mouth daily. 01/04/24   [name], Elige Radon, MD  QUEtiapine (SEROQUEL) 50 MG tablet TAKE 1 TABLET BY MOUTH EVERYDAY AT BEDTIME 01/28/24   [name], Elige Radon,  MD  traMADol (ULTRAM) 50 MG tablet Take 1 tablet (50 mg total) by mouth every 8 (eight) hours as needed. 01/04/24   [name], Elige Radon, MD      Allergies    Nitrofurantoin, Nsaids, Other, Beta adrenergic blockers, Flonase [fluticasone propionate], Prevnar 13 [pneumococcal 13-val conj vacc], Propranolol hcl, Hydrocodone, Alendronate sodium, Ciprofloxacin, Codeine, Crestor [rosuvastatin calcium], Darvon, Lipitor [atorvastatin calcium], Nortriptyline hcl, Potassium-containing compounds, Pravachol, Sulfa antibiotics, Sulfamethoxazole, Ultram [tramadol hcl], Welchol [colesevelam hcl], Zetia [ezetimibe], and Zocor [simvastatin]    Review of Systems    Review of Systems  Reason unable to perform ROS: Dementia.    Physical Exam Updated Vital Signs BP (!) 147/90   Pulse (!) 101   Temp (!) 97.5 F (36.4 C) (Temporal)   Resp (!) 23   Ht 5\' 4"  (1.626 m)   Wt 53 kg   LMP 03/21/1971   SpO2 96%   BMI 20.06 kg/m  Physical Exam Vitals and nursing note reviewed.   Gen: NAD Eyes: PERRL, EOMI HEENT: no oropharyngeal swelling Neck: trachea midline Resp: clear to auscultation bilaterally Card: regular rate, irregular rhythm, no murmurs, rubs, or gallops Abd: nontender, nondistended Extremities: no calf tenderness, no edema Vascular: 2+ radial pulses bilaterally, 2+ DP pulses bilaterally Skin: no rashes Psyc: acting appropriately   ED Results / Procedures / Treatments   Labs (all labs ordered are listed, but only abnormal results are displayed) Labs Reviewed  BASIC METABOLIC PANEL - Abnormal; Notable for the following components:      Result Value   Glucose, Bld 106 (*)    GFR, Estimated 59 (*)    All other components within normal limits  CBC - Abnormal; Notable for the following components:   RBC 3.57 (*)    Hemoglobin 10.7 (*)    HCT 33.8 (*)    RDW 18.7 (*)    All other components within normal limits  URINALYSIS, ROUTINE W REFLEX MICROSCOPIC - Abnormal; Notable for the following components:   Protein, ur 30 (*)    Leukocytes,Ua SMALL (*)    All other components within normal limits  URINALYSIS, MICROSCOPIC (REFLEX) - Abnormal; Notable for the following components:   Bacteria, UA FEW (*)    All other components within normal limits  TROPONIN I (HIGH SENSITIVITY) - Abnormal; Notable for the following components:   Troponin I (High Sensitivity) 41 (*)    All other components within normal limits  MAGNESIUM  DIGOXIN LEVEL  TROPONIN I (HIGH SENSITIVITY)    EKG EKG Interpretation Date/Time:  Sunday February 17 2024 11:26:41 EDT Ventricular Rate:  92 PR Interval:    QRS Duration:  119 QT Interval:  376 QTC  Calculation: 466 R Axis:   33  Text Interpretation: Atrial fibrillation Incomplete left bundle branch block Borderline low voltage, extremity leads Probable LVH with secondary repol abnrm Anterior ST elevation, probably due to LVH Confirmed by Beckey Downing [phone]) on 02/17/2024 11:40:10 AM  Radiology CT CHEST WO CONTRAST Result Date: 02/17/2024 CLINICAL DATA:  Chronic cough. Recent diagnosis of urinary tract infection. Weakness. Dementia. EXAM: CT CHEST WITHOUT CONTRAST TECHNIQUE: Multidetector CT imaging of the chest was performed following the standard protocol without IV contrast. RADIATION DOSE REDUCTION: This exam was performed according to the departmental dose-optimization program which includes automated exposure control, adjustment of the mA and/or kV according to patient size and/or use of iterative reconstruction technique. COMPARISON:  Chest radiograph 2 days ago.  CT of 12/11/2023 FINDINGS: Cardiovascular: Mild motion and patient arm position degradation. Tortuous thoracic  aorta. Aortic atherosclerosis. Moderate cardiomegaly, without pericardial effusion. Median sternotomy for CABG. Aortic valve calcifications are nonspecific in this age group. Pulmonary artery enlargement, outflow tract 3.2 cm. Mediastinum/Nodes: No mediastinal or hilar adenopathy, given limitations of unenhanced CT. Tiny hiatal hernia. Lungs/Pleura: Small bilateral pleural effusions, increased on the right and new on the left. Right hemidiaphragm elevation.  Mild right base atelectasis. Minimal dependent left base subsegmental atelectasis. Upper Abdomen: Cholecystectomy. Subcentimeter low-density right hepatic lobe lesion is similar and likely a cyst. Normal imaged portions of the spleen, pancreas, adrenal glands, left kidney. Right renal sinus cysts. Upper pole subcentimeter low-density right renal lesion is too small to characterize but most likely a cyst. No specific follow-up indicated. Musculoskeletal: Intact sternotomy  wires. Remote right rib fractures. Accentuation of expected thoracic kyphosis. Moderate T3 compression deformity is similar. No change in a mild T1 compression deformity. IMPRESSION: 1. Mild motion and patient arm position degradation. 2. Small, right larger than left pleural effusions with bibasilar atelectasis. 3. Pulmonary artery enlargement suggests pulmonary arterial hypertension. Electronically Signed   By: Jeronimo Greaves M.D.   On: 02/17/2024 12:22   DG Chest Port 1 View Result Date: 02/15/2024 CLINICAL DATA:  Shortness of breath EXAM: PORTABLE CHEST 1 VIEW COMPARISON:  10/23/2018, CT 12/11/2023 FINDINGS: Post sternotomy changes. Mild chronic elevation of the right diaphragm. Probable pleuroparenchymal scarring at the left base. Small right-sided pleural effusion with right basilar airspace disease. Linear scarring in the right mid lung. Irregular airspace opacity at the right apex. IMPRESSION: Small right pleural effusion with right basilar airspace disease, atelectasis versus pneumonia. Irregular airspace opacity at the right apex, not definitively seen on January CT comparison, question pneumonia, but mass not excluded. Radiographic follow-up to resolution is recommended, alternatively CT could be obtained for further assessment. Electronically Signed   By: Jasmine Pang M.D.   On: 02/15/2024 16:18    Procedures Procedures    Medications Ordered in ED Medications  diltiazem (CARDIZEM) 125 mg in dextrose 5% 125 mL (1 mg/mL) infusion (has no administration in time range)    ED Course/ Medical Decision Making/ A&P                                 Medical Decision Making 89 year old female with past medical history of dementia and atrial fibrillation presenting to the emergency department today with concern for generalized weakness.  The patient was found to be in atrial fibrillation with RVR initially with medics.  Her rate has improved here.  EKG shows rate controlled atrial fibrillation  with nonspecific ST-T changes on my read.  I will further evaluate her here with basic labs to evaluate for electrolyte abnormalities or anemia.  It appears that she did have an abnormal chest x-ray few days ago so we will obtain a CT scan without contrast to further evaluate for pneumonia that would potentially change disposition.  Will keep patient on the cardiac monitor and reevaluate for ultimate disposition.  She likely require admission with this being a recurrent issue.  The patient's work appears reassuring.  Her heart rate did go back up into the 120s and 130s.  Diltiazem infusion is started.  Calls placed to hospitalist service for admission.  CRITICAL CARE Performed by: Durwin Glaze   Total critical care time: 37 minutes  Critical care time was exclusive of separately billable procedures and treating other patients.  Critical care was necessary to treat or prevent imminent or  life-threatening deterioration.  Critical care was time spent personally by me on the following activities: development of treatment plan with patient and/or surrogate as well as nursing, discussions with consultants, evaluation of patient's response to treatment, examination of patient, obtaining history from patient or surrogate, ordering and performing treatments and interventions, ordering and review of laboratory studies, ordering and review of radiographic studies, pulse oximetry and re-evaluation of patient's condition.   Amount and/or Complexity of Data Reviewed Labs: ordered. Radiology: ordered.  Risk Prescription drug management. Decision regarding hospitalization.           Final Clinical Impression(s) / ED Diagnoses Final diagnoses:  Atrial fibrillation with RVR [facility])    Rx / DC Orders ED Discharge Orders     None         Durwin Glaze, MD 02/17/24 1342

## 2024-02-17 NOTE — Assessment & Plan Note (Addendum)
 89 year old female presenting with recurrent atrial fibrillation with RVR with exertion since Friday -obs to tele -cardizem gtt started and heart rate better controlled. Start back oral and titrate off drip  -echo pending -on eliquis -digoxin level wnl after changing dosing to daily since ED visit on Friday, but now back to every other day. Will change to daily (normal renal function) and she has close f/u on Thursday with her cardiologist  -on cardizem 30mg  BID, consider increase if blood pressure will allow  -has f/u with her cardiologist on Thursday this week,  likely needs  tweaking to her anti arrhythmics. Consider inpatient consult if continues to not be controlled

## 2024-02-17 NOTE — Assessment & Plan Note (Signed)
 Echo in 2017 with normal EF and grade 1 DD She appears dry on exam CT chest with bilateral small pleural effusion and cardiomegaly.  Bnp pending Effusions likely from recurrent atrial fib with RVR Repeat echo pending  Daily weights

## 2024-02-17 NOTE — ED Notes (Signed)
 Patient transported to CT

## 2024-02-17 NOTE — Assessment & Plan Note (Signed)
 Continue paxil

## 2024-02-17 NOTE — Progress Notes (Deleted)
  Cardiology Office Note:   Date:  02/17/2024  ID:  Emily Wagner, DOB [birth year], MRN [id number] PCP: [name], Elige Radon, MD  [facility] Providers Cardiologist:  None {  History of Present Illness:   Emily Wagner is a 89 y.o. female who presents for evaluation of atrial fib.     She has a history of  bypass surgery.  She has had fluctuating BPs.  She has a diagnosis of post polio syndrome.   She has chronic fatigue.   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  She was hospitalized May of last year for this.   She had increased ventricular rate and so I added digoxin.  She was added to a DOD schedule recently because of fatigue and palpitations.  This was managed conservatively.    Since I saw her ***   ***   she saw her Dr.Acharya in January for chest pain.  She was managed conservatively and she did not want nuclear testing.  She comes in today with again multiple somatic complaints but she says she is being treated for [facility] spotted fever and she had a urinary infection recently.  She has fatigue mostly.  She thinks her chest pain is better.  She is really not complaining of her atrial fibrillation.  She is just tired.  She brings in blood pressures that are kind of illegible and probably not accurate.  Most of the blood pressures I see recorded in the chart are reasonably well-controlled.  There is an isolated 189 systolic.  ROS: ***  Studies Reviewed:    EKG:       ***  Risk Assessment/Calculations:   {Does this patient have ATRIAL FIBRILLATION?:[phone]}          Physical Exam:   VS:  LMP 03/21/1971    Wt Readings from Last 3 Encounters:  02/17/24 116 lb 13.5 oz (53 kg)  02/15/24 117 lb 1 oz (53.1 kg)  01/15/24 117 lb (53.1 kg)     GEN: Well nourished, well developed in no acute distress NECK: No JVD; No carotid bruits CARDIAC: ***RR, *** murmurs, rubs, gallops RESPIRATORY:  Clear to auscultation without rales, wheezing or rhonchi  ABDOMEN:  Soft, non-tender, non-distended EXTREMITIES:  No edema; No deformity   ASSESSMENT AND PLAN:   CAD:   ***  The patient has no new sypmtoms.  No further cardiovascular testing is indicated.  We will continue with aggressive risk reduction and meds as listed.  Of note she could not tolerate isosorbide   ATRIAL FIB:      She tolerates anticoagulation.   *** She has good rate control.  No change in therapy.   DYSLIPIDEMIA:    ***  he has not tolerated statins.   HTN:  Her BP is ***  difficult to interpret at home.  She is going to have a nurse come from church to help her take some blood pressures and make sure her machine is accurate and teach her how to keep a record so that I can review these.    LBBB:   ***  This seems to be rate related as the most recent EKG on 3/13 that I reviewed narrow complex     Follow up ***  Signed, [name], MD

## 2024-02-17 NOTE — ED Triage Notes (Addendum)
 Pt BIB [facility] from home. Pt was seen on 3/21 with a-fib and dx with a UTI. She was started on Keflex at that time. Today pt is having generalized weakness and EMS report pt was initially in a-fib with RVR with a rate in the 150's. They admin 10 mg of Cardizem IVP and pt's HR is now down to 80-90 with a-fib. Pt has a hx of dementia and is confused on arrival. EMS reports that pt is at baseline per family.   DNR form presented with pt.   EMS Vitals  CBG 158 BP 145/89

## 2024-02-17 NOTE — Assessment & Plan Note (Signed)
 On cardizem gtt for rate control  May need dose adjust to her home cardizem dose

## 2024-02-17 NOTE — H&P (Addendum)
 History and Physical    Patient: Emily Wagner ZOX:[id number] DOB: August 23, 1933 DOA: 02/17/2024 DOS: the patient was seen and examined on 02/17/2024 PCP: [name], Elige Radon, MD  Patient coming from: Home - lives with her daughter.    Chief Complaint: atrial fibrillation   HPI: Emily Wagner is a 89 y.o. female with medical history significant of atrial fib, anxiety and depression, IC, HLD, hx ov CABG,  hx ov CVA, dementia who presented to ED with complaints of light headedness with exertion and atrial fib with RVR. Daughter states that patient lost her sister about 3 weeks ago and then her best friend lost her daughter. Her daughter doesn't think she has been grieving due to her strokes. Since Friday she has been going into RVR and becoming symptomatic with these episodes. She was seen in ED on Friday for RVR and was found to have a UTI. Her digoxin was also increased to daily from every other day and she was sent home. She continued to have symptomatic episodes with exertion which prompted her visit today.   Denies any fever/chills, vision changes/headaches, chest pain, shortness of breath or cough, abdominal pain, N/V/D, dysuria or leg swelling.   She does not smoke or drink alcohol.   ER Course:  vitals: afebrile, bp: 145/87, HR; 89, RR:18, oxygen: 99%RA Pertinent labs: hgb: 10.7,  CT chest wo contrast:Small, right larger than left pleural effusions with bibasilar atelectasis. Pulmonary artery enlargement suggests pulmonary arterial hypertension. In ED: started on cardizem gtt. [facility] asked to admit.   Review of Systems: As mentioned in the history of present illness. All other systems reviewed and are negative. Past Medical History:  Diagnosis Date   Adjustment disorder with mixed anxiety and depressed mood    Atrial fibrillation (HCC) 11/04/2015   Post-operative   Benign hypertensive heart disease without heart failure    CAD (coronary artery disease)    Infection of urinary  tract 10/31/2015   Interstitial cystitis    Osteopenia    Other and unspecified hyperlipidemia    Other dyspnea and respiratory abnormality    Pelvic fracture (HCC) 05/30/2015   Post-polio syndrome    polio age 84   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   LIMA to LAD   Stroke [facility])    Past Surgical History:  Procedure Laterality Date   ABDOMINAL HYSTERECTOMY     Cancer and endometriosis   CARDIAC CATHETERIZATION  2008   CARDIAC CATHETERIZATION N/A 10/29/2015   Procedure: Left Heart Cath and Coronary Angiography;  Surgeon: Lyn Records, MD;  Location: [facility] INVASIVE CV LAB;  Service: Cardiovascular;  Laterality: N/A;   CATARACT EXTRACTION     CHOLECYSTECTOMY     CORONARY ARTERY BYPASS GRAFT N/A 11/02/2015   Procedure: OFF PUMP CORONARY ARTERY BYPASS GRAFTING (CABG) TIMES ONE;  Surgeon: Purcell Nails, MD;  Location: [facility] OR;  Service: Open Heart Surgery;  Laterality: N/A;  LIMA to LAD   FEMUR FRACTURE SURGERY  09/2015   right hip compression screws    FRACTURE SURGERY     Right hip   HERNIA REPAIR     Right inguinal   TEE WITHOUT CARDIOVERSION N/A 11/02/2015   Procedure: TRANSESOPHAGEAL ECHOCARDIOGRAM (TEE);  Surgeon: Purcell Nails, MD;  Location[facility] OR;  Service: Open Heart Surgery;  Laterality: N/A;   Social History:  reports that she has never smoked. She has never used smokeless tobacco. She reports that she does not drink alcohol and does not use drugs.  Allergies  Allergen Reactions   Nitrofurantoin Other (See Comments)    Liver failure   Nsaids Other (See Comments)    Kidney failure   Other Other (See Comments)    Most antibiotics require supervision per patient   Beta Adrenergic Blockers Other (See Comments)   Flonase [Fluticasone Propionate] Other (See Comments)    headache   Prevnar 13 [Pneumococcal 13-Val Conj Vacc] Other (See Comments)    Nerve damage in left arm   Propranolol Hcl Other (See Comments)    Unknown   Hydrocodone Other (See Comments)   Alendronate  Sodium Other (See Comments)   Ciprofloxacin Other (See Comments)    Patient denies allergy--"I take Cipro for bladder infections"   Codeine Other (See Comments)   Crestor [Rosuvastatin Calcium] Other (See Comments)   Darvon Other (See Comments)   Lipitor [Atorvastatin Calcium] Other (See Comments)   Nortriptyline Hcl Other (See Comments)    States she felt like worms where crawling under her skin.   Potassium-Containing Compounds     Rash and raw areas / sore breast    Pravachol Other (See Comments)   Sulfa Antibiotics Other (See Comments)   Sulfamethoxazole Other (See Comments)    Unknown   Ultram [Tramadol Hcl] Itching   Welchol [Colesevelam Hcl] Other (See Comments)   Zetia [Ezetimibe] Other (See Comments)   Zocor [Simvastatin] Other (See Comments)    Family History  Problem Relation Age of Onset   CAD Mother 44       Died suddenly   CAD Father 39       Died suddenly   CAD Daughter 28   Diabetes type I Daughter        Since age 18   Heart disease Sister    Osteoporosis Sister     Prior to Admission medications   Medication Sig Start Date End Date Taking? Authorizing Provider  acetaminophen (TYLENOL) 325 MG tablet Take 325 mg by mouth every 6 (six) hours as needed.    [provider]  ALPRAZolam Prudy Feeler) 0.5 MG tablet Take 1 tablet (0.5 mg total) by mouth at bedtime as needed for anxiety (MRI). Pt may take 1 tablet 30 min prior to MRI and then repeat if needed at the time of the MRI. (MUST have a driver) 04/28/94   Micki Riley, MD  apixaban (ELIQUIS) 2.5 MG TABS tablet Take 1 tablet (2.5 mg total) by mouth 2 (two) times daily. 10/13/22   [name], MD  cephALEXin (KEFLEX) 500 MG capsule Take 1 capsule (500 mg total) by mouth 4 (four) times daily. 02/15/24   Anders Simmonds T, DO  cholecalciferol (VITAMIN D3) 25 MCG (1000 UNIT) tablet Take 1,000 Units by mouth daily.    [provider]  Cyanocobalamin (VITAMIN B-12 PO) Take 500 mcg by mouth. Take one  tablet by mouth on Mon.,Wed., and Friday    [provider]  digoxin (LANOXIN) 0.125 MG tablet TAKE 1 TABLET BY MOUTH DAILY 08/02/23   [name], MD  diltiazem (CARDIZEM) 30 MG tablet Take 1 tablet (30 mg total) by mouth 2 (two) times daily. 02/11/24   [name], MD  doxycycline (VIBRA-TABS) 100 MG tablet Take 1 tablet (100 mg total) by mouth 2 (two) times daily. 1 po bid 01/28/24   Bennie Pierini, FNP  famotidine (PEPCID) 20 MG tablet TAKE 1 TABLET BY MOUTH TWICE A DAY 01/28/24   [name], Elige Radon, MD  levothyroxine (SYNTHROID) 50 MCG tablet Take 1 tablet (50 mcg total) by  mouth daily before breakfast. 12/17/23   [name], Elige Radon, MD  lidocaine (LIDODERM) 5 % Place 1 patch onto the skin daily. Remove & Discard patch within 12 hours or as directed by MD 12/11/23   Gloris Manchester, MD  methocarbamol (ROBAXIN) 500 MG tablet Take 1 tablet (500 mg total) by mouth every 12 (twelve) hours as needed for up to 12 doses for muscle spasms. 12/11/23   Gloris Manchester, MD  Multiple Vitamins-Minerals (PRESERVISION AREDS 2 PO) Take 1 capsule by mouth 2 (two) times daily.    [provider]  PARoxetine (PAXIL) 20 MG tablet Take 1 tablet (20 mg total) by mouth daily. 01/04/24   [name], Elige Radon, MD  QUEtiapine (SEROQUEL) 50 MG tablet TAKE 1 TABLET BY MOUTH EVERYDAY AT BEDTIME 01/28/24   [name], Elige Radon, MD  traMADol (ULTRAM) 50 MG tablet Take 1 tablet (50 mg total) by mouth every 8 (eight) hours as needed. 01/04/24   [name], Elige Radon, MD    Physical Exam: Vitals:   02/17/24 1630 02/17/24 1632 02/17/24 1700 02/17/24 1715  BP:   (!) 146/94 (!) 151/100  Pulse: 98  90   Resp:   19   Temp:  (!) 97.3 F (36.3 C)    TempSrc:  Temporal    SpO2:   94%   Weight:      Height:       General:  Appears calm and comfortable and is in NAD. Thin.  Eyes:  PERRL, EOMI, normal lids, iris ENT:  HOH, lips & tongue, dry mucous membranes; appropriate dentition Neck:  no LAD, masses or thyromegaly;  no carotid bruits Cardiovascular:  irregularly, irregular, no m/r/g. No LE edema.  Respiratory:   CTA bilaterally with no wheezes/rales/rhonchi.  Normal respiratory effort. Abdomen:  soft, NT, ND, NABS Back:   normal alignment, no CVAT Skin:  no rash or induration seen on limited exam Musculoskeletal:  grossly normal tone BUE/BLE, good ROM, no bony abnormality Lower extremity:  No LE edema.  Limited foot exam with no ulcerations.  2+ distal pulses. Psychiatric:  grossly normal mood and affect, speech fluent and appropriate, AOx3 Neurologic:  CN 2-12 grossly intact, moves all extremities in coordinated fashion, sensation intact   Radiological Exams on Admission: Independently reviewed - see discussion in A/P where applicable  CT CHEST WO CONTRAST Result Date: 02/17/2024 CLINICAL DATA:  Chronic cough. Recent diagnosis of urinary tract infection. Weakness. Dementia. EXAM: CT CHEST WITHOUT CONTRAST TECHNIQUE: Multidetector CT imaging of the chest was performed following the standard protocol without IV contrast. RADIATION DOSE REDUCTION: This exam was performed according to the departmental dose-optimization program which includes automated exposure control, adjustment of the mA and/or kV according to patient size and/or use of iterative reconstruction technique. COMPARISON:  Chest radiograph 2 days ago.  CT of 12/11/2023 FINDINGS: Cardiovascular: Mild motion and patient arm position degradation. Tortuous thoracic aorta. Aortic atherosclerosis. Moderate cardiomegaly, without pericardial effusion. Median sternotomy for CABG. Aortic valve calcifications are nonspecific in this age group. Pulmonary artery enlargement, outflow tract 3.2 cm. Mediastinum/Nodes: No mediastinal or hilar adenopathy, given limitations of unenhanced CT. Tiny hiatal hernia. Lungs/Pleura: Small bilateral pleural effusions, increased on the right and new on the left. Right hemidiaphragm elevation.  Mild right base atelectasis. Minimal  dependent left base subsegmental atelectasis. Upper Abdomen: Cholecystectomy. Subcentimeter low-density right hepatic lobe lesion is similar and likely a cyst. Normal imaged portions of the spleen, pancreas, adrenal glands, left kidney. Right renal sinus cysts. Upper pole subcentimeter low-density right renal lesion  is too small to characterize but most likely a cyst. No specific follow-up indicated. Musculoskeletal: Intact sternotomy wires. Remote right rib fractures. Accentuation of expected thoracic kyphosis. Moderate T3 compression deformity is similar. No change in a mild T1 compression deformity. IMPRESSION: 1. Mild motion and patient arm position degradation. 2. Small, right larger than left pleural effusions with bibasilar atelectasis. 3. Pulmonary artery enlargement suggests pulmonary arterial hypertension. Electronically Signed   By: Jeronimo Greaves M.D.   On: 02/17/2024 12:22    EKG: Independently reviewed.  Atrial fibrillation with rate 92; nonspecific ST changes with no evidence of acute ischemia Incomplete LBBB  Labs on Admission: I have personally reviewed the available labs and imaging studies at the time of the admission.  Pertinent labs:   Hgb: 10.7   Assessment and Plan: Principal Problem:   Atrial fibrillation with RVR (HCC) Active Problems:   Essential hypertension   Adjustment disorder with mixed anxiety and depressed mood   CAD (coronary artery disease)   Hx of ischemic right MCA stroke   Acquired hypothyroidism   Normocytic anemia   Chronic diastolic CHF (congestive heart failure) (HCC)    Assessment and Plan: * Atrial fibrillation with RVR (HCC) 89 year old female presenting with recurrent atrial fibrillation with RVR with exertion since Friday -obs to tele -cardizem gtt started and heart rate better controlled. Start back oral and titrate off drip  -echo pending -on eliquis -digoxin level wnl after changing dosing to daily since ED visit on Friday, but now back  to every other day. Will change to daily (normal renal function) and she has close f/u on Thursday with her cardiologist  -on cardizem 30mg  BID, consider increase if blood pressure will allow  -has f/u with her cardiologist on Thursday this week,  likely needs  tweaking to her anti arrhythmics. Consider inpatient consult if continues to not be controlled    Chronic diastolic CHF (congestive heart failure) (HCC) Echo in 2017 with normal EF and grade 1 DD She appears dry on exam CT chest with bilateral small pleural effusion and cardiomegaly.  Bnp pending Effusions likely from recurrent atrial fib with RVR Repeat echo pending  Daily weights   Normocytic anemia Slowly down trending No overt bleeding and denies bleeding  Check iron studies  Fecal occult  Trend   Acquired hypothyroidism TSH wnl a few days ago Continue with synthroid daily   CAD (coronary artery disease) Hx of CABG Continue medical management   Adjustment disorder with mixed anxiety and depressed mood Continue paxil   Essential hypertension On cardizem gtt for rate control  May need dose adjust to her home cardizem dose     Advance Care Planning:   Code Status: Limited: Do not attempt resuscitation (DNR) -DNR-LIMITED -Do Not Intubate/DNI    Consults: none   DVT Prophylaxis: eliquis   Family Communication: daughter at bedside   Severity of Illness: The appropriate patient status for this patient is OBSERVATION. Observation status is judged to be reasonable and necessary in order to provide the required intensity of service to ensure the patient's safety. The patient's presenting symptoms, physical exam findings, and initial radiographic and laboratory data in the context of their medical condition is felt to place them at decreased risk for further clinical deterioration. Furthermore, it is anticipated that the patient will be medically stable for discharge from the hospital within 2 midnights of  admission.   Author: Orland Mustard, MD 02/17/2024 5:41 PM  For on call review [email or web address].

## 2024-02-18 ENCOUNTER — Observation Stay (HOSPITAL_BASED_OUTPATIENT_CLINIC_OR_DEPARTMENT_OTHER)

## 2024-02-18 DIAGNOSIS — I480 Paroxysmal atrial fibrillation: Secondary | ICD-10-CM | POA: Diagnosis not present

## 2024-02-18 DIAGNOSIS — I5021 Acute systolic (congestive) heart failure: Secondary | ICD-10-CM

## 2024-02-18 DIAGNOSIS — R7989 Other specified abnormal findings of blood chemistry: Secondary | ICD-10-CM

## 2024-02-18 DIAGNOSIS — I4891 Unspecified atrial fibrillation: Secondary | ICD-10-CM | POA: Diagnosis not present

## 2024-02-18 LAB — BASIC METABOLIC PANEL
Anion gap: 7 (ref 5–15)
BUN: 20 mg/dL (ref 8–23)
CO2: 23 mmol/L (ref 22–32)
Calcium: 8.2 mg/dL — ABNORMAL LOW (ref 8.9–10.3)
Chloride: 107 mmol/L (ref 98–111)
Creatinine, Ser: 1.15 mg/dL — ABNORMAL HIGH (ref 0.44–1.00)
GFR, Estimated: 46 mL/min — ABNORMAL LOW (ref >60)
Glucose, Bld: 89 mg/dL (ref 70–99)
Potassium: 3.5 mmol/L (ref 3.5–5.1)
Sodium: 137 mmol/L (ref 135–145)

## 2024-02-18 LAB — ECHOCARDIOGRAM COMPLETE
AR max vel: 1.25 cm2
AV Area VTI: 1.38 cm2
AV Area mean vel: 1.21 cm2
AV Mean grad: 5.7 mmHg
AV Peak grad: 9.9 mmHg
Ao pk vel: 1.58 m/s
Area-P 1/2: 6.83 cm2
Calc EF: 58.2 %
Height: 64 in
MV VTI: 1.33 cm2
S' Lateral: 3.2 cm
Single Plane A2C EF: 63 %
Single Plane A4C EF: 52.6 %
Weight: 1816.59 [oz_av]

## 2024-02-18 LAB — CBC
HCT: 30.9 % — ABNORMAL LOW (ref 36.0–46.0)
Hemoglobin: 10.1 g/dL — ABNORMAL LOW (ref 12.0–15.0)
MCH: 30.1 pg (ref 26.0–34.0)
MCHC: 32.7 g/dL (ref 30.0–36.0)
MCV: 92.2 fL (ref 80.0–100.0)
Platelets: 209 10*3/uL (ref 150–400)
RBC: 3.35 MIL/uL — ABNORMAL LOW (ref 3.87–5.11)
RDW: 18.7 % — ABNORMAL HIGH (ref 11.5–15.5)
WBC: 6.2 10*3/uL (ref 4.0–10.5)
nRBC: 0 % (ref 0.0–0.2)

## 2024-02-18 LAB — MAGNESIUM: Magnesium: 1.7 mg/dL (ref 1.7–2.4)

## 2024-02-18 MED ORDER — CEPHALEXIN 500 MG PO CAPS
500.0000 mg | ORAL_CAPSULE | Freq: Two times a day (BID) | ORAL | Status: DC
  Administered 2024-02-18 – 2024-02-20 (×4): 500 mg via ORAL
  Filled 2024-02-18 (×5): qty 1

## 2024-02-18 MED ORDER — POTASSIUM CHLORIDE CRYS ER 20 MEQ PO TBCR
40.0000 meq | EXTENDED_RELEASE_TABLET | Freq: Once | ORAL | Status: DC
  Filled 2024-02-18: qty 2

## 2024-02-18 MED ORDER — FUROSEMIDE 10 MG/ML IJ SOLN
40.0000 mg | Freq: Once | INTRAMUSCULAR | Status: AC
  Administered 2024-02-18: 40 mg via INTRAVENOUS
  Filled 2024-02-18: qty 4

## 2024-02-18 MED ORDER — DILTIAZEM HCL 30 MG PO TABS: 30.0000 mg | ORAL_TABLET | Freq: Four times a day (QID) | ORAL | Status: DC

## 2024-02-18 MED ORDER — METOPROLOL TARTRATE 25 MG PO TABS
25.0000 mg | ORAL_TABLET | Freq: Two times a day (BID) | ORAL | Status: DC
  Administered 2024-02-18: 25 mg via ORAL
  Filled 2024-02-18: qty 1

## 2024-02-18 NOTE — Progress Notes (Signed)
  Echocardiogram 2D Echocardiogram has been performed.  Ocie Doyne RDCS 02/18/2024, 11:26 AM

## 2024-02-18 NOTE — Progress Notes (Signed)
 3/24 I spoke to patient's daughter Okey Regal telephone.

## 2024-02-18 NOTE — Progress Notes (Addendum)
 PROGRESS NOTE    Mathew Storck  OZD:[id number] DOB: February 21, 1933 DOA: 02/17/2024 PCP: [name], Elige Radon, MD   Brief Narrative:  This 89 y.o. female with medical history significant of Atrial fib, anxiety and depression, HLD, hx. of CABG,  hx ov CVA, dementia who presented to ED with complaints of light headedness with exertion and atrial fib with RVR. Daughter states that patient lost her sister about 3 weeks ago and then her best friend lost her daughter, since Friday she has been going into A-fib with RVR and becoming symptomatic with these episodes.  She was seen in the ED on Friday and was found to have a UTI.  Her digoxin dose was increased to daily from every other day and she was sent home.  She continued to have symptomatic episodes with exertion which prompted her to come to the ED today. ED workup : CT chest shows small right larger than left pleural effusion.  Pulmonary artery hypertension.  Patient was started on Cardizem gtt. and she was admitted for further evaluation.  Assessment & Plan:   Principal Problem:   Atrial fibrillation with RVR (HCC) Active Problems:   Essential hypertension   Adjustment disorder with mixed anxiety and depressed mood   CAD (coronary artery disease)   Hx of ischemic right MCA stroke   Acquired hypothyroidism   Normocytic anemia   Chronic diastolic CHF (congestive heart failure) (HCC)   A-fib with RVR: She presented with recurrent episodes of atrial fibrillation with RVR while exertion. She was initiated on Cardizem gtt and heart rate better controlled.  Continue Eliquis. Echocardiogram shows LVEF 40 to 45%, LV dyssynchrony due to LBBB. Digoxin level within normal limits. She has close f/u on Thursday with her cardiologist. Increase cardizem 60 mg QID, consider increase if blood pressure will allow. Plan to discontinue Cardizem gtt.  Chronic diastolic CHF (congestive heart failure) (HCC) Echo in 2017 with normal EF and grade 1 DD She  appears dry on exam. CT chest with bilateral small pleural effusion and cardiomegaly.  BNP 896 ,  Effusions likely from recurrent atrial fib with RVR Daily weights. Cardiology consulted.   Normocytic anemia No overt bleeding and denies bleeding  Check iron studies  Stool for occult blood.   Acquired hypothyroidism: Continue synthroid daily    CAD (coronary artery disease) Hx of CABG Continue medical management .   Adjustment disorder with mixed anxiety and depressed mood Continue paxil .   Essential hypertension On cardizem gtt for rate control. May need dose adjust to her home cardizem dose.    DVT prophylaxis: Eliquis Code Status: (DNR Family Communication: No family at bedside Disposition Plan:    Status is: Observation The patient remains OBS appropriate and will d/c before 2 midnights.   Admitted for A-fib with RVR.  Consultants:  None  Procedures: Echocardiogram  Antimicrobials: Anti-infectives (From admission, onward)    Start     Dose/Rate Route Frequency Ordered Stop   02/18/24 2200  cephALEXin (KEFLEX) capsule 500 mg        500 mg Oral Every 12 hours 02/18/24 1052     02/17/24 1800  cephALEXin (KEFLEX) capsule 500 mg  Status:  Discontinued        500 mg Oral 4 times daily 02/17/24 1743 02/18/24 1052      Subjective: Patient was seen and examined at bedside.  Overnight events noted. Patient appears very deconditioned,  reports doing better, She asked when she can be discharged.  Objective: Vitals:  02/18/24 0800 02/18/24 0822 02/18/24 1049 02/18/24 1151  BP:  134/76 132/68 123/62  Pulse: 79 71 71 66  Resp: (!) 22 18  17   Temp:  97.9 F (36.6 C)  98.4 F (36.9 C)  TempSrc:    Oral  SpO2: 97% 96%  95%  Weight:      Height:        Intake/Output Summary (Last 24 hours) at 02/18/2024 1400 Last data filed at 02/18/2024 1000 Gross per 24 hour  Intake 467.12 ml  Output 400 ml  Net 67.12 ml   Filed Weights   02/17/24 1127 02/17/24  2045 02/18/24 0456  Weight: 53 kg 51.8 kg 51.5 kg    Examination:  General exam: Appears calm and comfortable, deconditioned, Not in distress. Respiratory system: Clear to auscultation. Respiratory effort normal. RR 16 Cardiovascular system: S1 & S2 heard, RRR. No JVD, murmurs, rubs, gallops or clicks.  Gastrointestinal system: Abdomen is non distended, soft and nontender. Normal bowel sounds heard. Central nervous system: Alert and oriented X 3. No focal neurological deficits. Extremities: No edema, no cyanosis, no clubbing  Skin: No rashes, lesions or ulcers Psychiatry: Judgement and insight appear normal. Mood & affect appropriate.     Data Reviewed: I have personally reviewed following labs and imaging studies  CBC: Recent Labs  Lab 02/15/24 1306 02/17/24 1129 02/18/24 0519  WBC 6.7 8.2 6.2  HGB 11.6* 10.7* 10.1*  HCT 36.1 33.8* 30.9*  MCV 92.6 94.7 92.2  PLT 246 236 209   Basic Metabolic Panel: Recent Labs  Lab 02/15/24 1306 02/15/24 1402 02/17/24 1129 02/18/24 0519  NA 140  --  140 137  K 4.2  --  4.3 3.5  CL 105  --  106 107  CO2 26  --  24 23  GLUCOSE 104*  --  106* 89  BUN 23  --  21 20  CREATININE 0.90  --  0.93 1.15*  CALCIUM 9.8  --  8.9 8.2*  MG  --  2.0 1.7 1.7   GFR: Estimated Creatinine Clearance: 27.5 mL/min (A) (by C-G formula based on SCr of 1.15 mg/dL (H)). Liver Function Tests: Recent Labs  Lab 02/15/24 1402  AST 18  ALT 8  ALKPHOS 107  BILITOT 1.3*  PROT 7.0  ALBUMIN 4.0   Recent Labs  Lab 02/15/24 1402  LIPASE 25   No results for input(s): "AMMONIA" in the last 168 hours. Coagulation Profile: No results for input(s): "INR", "PROTIME" in the last 168 hours. Cardiac Enzymes: No results for input(s): "CKTOTAL", "CKMB", "CKMBINDEX", "TROPONINI" in the last 168 hours. BNP (last 3 results) No results for input(s): "PROBNP" in the last 8760 hours. HbA1C: No results for input(s): "HGBA1C" in the last 72 hours. CBG: Recent Labs   Lab 02/15/24 1313  GLUCAP 86   Lipid Profile: No results for input(s): "CHOL", "HDL", "LDLCALC", "TRIG", "CHOLHDL", "LDLDIRECT" in the last 72 hours. Thyroid Function Tests: Recent Labs    02/15/24 1437  TSH 1.861   Anemia Panel: Recent Labs    02/17/24 1553  VITAMINB12 1,691*  FERRITIN 54  TIBC 274  IRON 42   Sepsis Labs: No results for input(s): "PROCALCITON", "LATICACIDVEN" in the last 168 hours.  No results found for this or any previous visit (from the past 240 hours).   Radiology Studies: ECHOCARDIOGRAM COMPLETE Result Date: 02/18/2024    ECHOCARDIOGRAM REPORT   Patient Name:   DAVY FAUGHT Date of Exam: 02/18/2024 Medical Rec #:  [id number]  Height:       64.0 in Accession #:    [id number]        Weight:       113.5 lb Date of Birth:  04-Jun-1933         BSA:          1.538 m Patient Age:    89 years          BP:           134/76 mmHg Patient Gender: F                 HR:           65 bpm. Exam Location:  Inpatient Procedure: 2D Echo, Cardiac Doppler and Color Doppler (Both Spectral and Color            Flow Doppler were utilized during procedure). Indications:    Atrial fibrillation  History:        Patient has prior history of Echocardiogram examinations, most                 recent 01/26/2016. CHF, CAD, Stroke, Arrythmias:LBBB; Risk                 Factors:Hypertension.  Sonographer:    Vern Claude Referring Phys: [id number] ALLISON WOLFE IMPRESSIONS  1. LV dyssynchrony due to LBBB. Left ventricular ejection fraction, by estimation, is 40 to 45%. The left ventricle has mildly decreased function. The left ventricle demonstrates global hypokinesis. There is mild concentric left ventricular hypertrophy.  Left ventricular diastolic parameters are indeterminate.  2. Right ventricular systolic function is mildly reduced. The right ventricular size is normal. There is mildly elevated pulmonary artery systolic pressure. The estimated right ventricular systolic pressure is 36.5  mmHg.  3. Left atrial size was severely dilated.  4. The mitral valve is degenerative. Moderate to severe mitral valve regurgitation. No evidence of mitral stenosis.  5. Tricuspid valve regurgitation is mild to moderate.  6. Probable moderate low-gradient, low-flow AS. The non-coronary cusp is fixed but the RCC and LCC appears to open adequately. The aortic valve is tricuspid. There is moderate calcification of the aortic valve. Aortic valve regurgitation is trivial. Moderate aortic valve stenosis. Aortic valve area, by VTI measures 1.38 cm. Aortic valve mean gradient measures 5.7 mmHg. Aortic valve Vmax measures 1.58 m/s.  7. The inferior vena cava is normal in size with <50% respiratory variability, suggesting right atrial pressure of 8 mmHg. FINDINGS  Left Ventricle: LV dyssynchrony due to LBBB. Left ventricular ejection fraction, by estimation, is 40 to 45%. The left ventricle has mildly decreased function. The left ventricle demonstrates global hypokinesis. The left ventricular internal cavity size  was normal in size. There is mild concentric left ventricular hypertrophy. Abnormal (paradoxical) septal motion, consistent with left bundle branch block. Left ventricular diastolic parameters are indeterminate. Right Ventricle: The right ventricular size is normal. No increase in right ventricular wall thickness. Right ventricular systolic function is mildly reduced. There is mildly elevated pulmonary artery systolic pressure. The tricuspid regurgitant velocity  is 2.67 m/s, and with an assumed right atrial pressure of 8 mmHg, the estimated right ventricular systolic pressure is 36.5 mmHg. Left Atrium: Left atrial size was severely dilated. Right Atrium: Right atrial size was normal in size. Pericardium: There is no evidence of pericardial effusion. Mitral Valve: The mitral valve is degenerative in appearance. Moderate to severe mitral valve regurgitation. No evidence of mitral valve stenosis. MV peak gradient,  4.5 mmHg. The mean  mitral valve gradient is 1.0 mmHg. Tricuspid Valve: The tricuspid valve is normal in structure. Tricuspid valve regurgitation is mild to moderate. No evidence of tricuspid stenosis. Aortic Valve: Probable moderate low-gradient, low-flow AS. The non-coronary cusp is fixed but the RCC and LCC appears to open adequately. The aortic valve is tricuspid. There is moderate calcification of the aortic valve. Aortic valve regurgitation is trivial. Moderate aortic stenosis is present. Aortic valve mean gradient measures 5.7 mmHg. Aortic valve peak gradient measures 9.9 mmHg. Aortic valve area, by VTI measures 1.38 cm. Pulmonic Valve: The pulmonic valve was normal in structure. Pulmonic valve regurgitation is not visualized. No evidence of pulmonic stenosis. Aorta: The aortic root is normal in size and structure. Venous: The inferior vena cava is normal in size with less than 50% respiratory variability, suggesting right atrial pressure of 8 mmHg. IAS/Shunts: No atrial level shunt detected by color flow Doppler.  LEFT VENTRICLE PLAX 2D LVIDd:         4.40 cm      Diastology LVIDs:         3.20 cm      LV e' medial:    4.03 cm/s LV PW:         0.70 cm      LV E/e' medial:  25.8 LV IVS:        0.80 cm      LV e' lateral:   7.01 cm/s LVOT diam:     1.90 cm      LV E/e' lateral: 14.8 LV SV:         33 LV SV Index:   22 LVOT Area:     2.84 cm  LV Volumes (MOD) LV vol d, MOD A2C: 125.0 ml LV vol d, MOD A4C: 144.0 ml LV vol s, MOD A2C: 46.3 ml LV vol s, MOD A4C: 68.2 ml LV SV MOD A2C:     78.7 ml LV SV MOD A4C:     144.0 ml LV SV MOD BP:      79.6 ml RIGHT VENTRICLE             IVC RV Basal diam:  3.50 cm     IVC diam: 1.10 cm RV Mid diam:    2.30 cm RV S prime:     10.90 cm/s TAPSE (M-mode): 1.8 cm LEFT ATRIUM              Index        RIGHT ATRIUM           Index LA diam:        3.70 cm  2.41 cm/m   RA Area:     14.70 cm LA Vol (A2C):   120.0 ml 78.03 ml/m  RA Volume:   30.00 ml  19.51 ml/m LA Vol (A4C):    62.7 ml  40.77 ml/m LA Biplane Vol: 91.4 ml  59.43 ml/m  AORTIC VALVE                     PULMONIC VALVE AV Area (Vmax):    1.25 cm      PV Vmax:       0.80 m/s AV Area (Vmean):   1.21 cm      PV Peak grad:  2.6 mmHg AV Area (VTI):     1.38 cm AV Vmax:           157.67 cm/s AV Vmean:          161.096  cm/s AV VTI:            0.242 m AV Peak Grad:      9.9 mmHg AV Mean Grad:      5.7 mmHg LVOT Vmax:         69.70 cm/s LVOT Vmean:        45.900 cm/s LVOT VTI:          0.118 m LVOT/AV VTI ratio: 0.49  AORTA Ao Root diam: 3.00 cm Ao Asc diam:  3.30 cm MITRAL VALVE                TRICUSPID VALVE MV Area (PHT): 6.83 cm     TR Peak grad:   28.5 mmHg MV Area VTI:   1.33 cm     TR Vmax:        267.00 cm/s MV Peak grad:  4.5 mmHg MV Mean grad:  1.0 mmHg     SHUNTS MV Vmax:       1.06 m/s     Systemic VTI:  0.12 m MV Vmean:      51.7 cm/s    Systemic Diam: 1.90 cm MV Decel Time: 111 msec MV E velocity: 104.00 cm/s Arvilla Meres MD Electronically signed by Arvilla Meres MD Signature Date/Time: 02/18/2024/11:36:27 AM    Final    CT CHEST WO CONTRAST Result Date: 02/17/2024 CLINICAL DATA:  Chronic cough. Recent diagnosis of urinary tract infection. Weakness. Dementia. EXAM: CT CHEST WITHOUT CONTRAST TECHNIQUE: Multidetector CT imaging of the chest was performed following the standard protocol without IV contrast. RADIATION DOSE REDUCTION: This exam was performed according to the departmental dose-optimization program which includes automated exposure control, adjustment of the mA and/or kV according to patient size and/or use of iterative reconstruction technique. COMPARISON:  Chest radiograph 2 days ago.  CT of 12/11/2023 FINDINGS: Cardiovascular: Mild motion and patient arm position degradation. Tortuous thoracic aorta. Aortic atherosclerosis. Moderate cardiomegaly, without pericardial effusion. Median sternotomy for CABG. Aortic valve calcifications are nonspecific in this age group. Pulmonary artery enlargement,  outflow tract 3.2 cm. Mediastinum/Nodes: No mediastinal or hilar adenopathy, given limitations of unenhanced CT. Tiny hiatal hernia. Lungs/Pleura: Small bilateral pleural effusions, increased on the right and new on the left. Right hemidiaphragm elevation.  Mild right base atelectasis. Minimal dependent left base subsegmental atelectasis. Upper Abdomen: Cholecystectomy. Subcentimeter low-density right hepatic lobe lesion is similar and likely a cyst. Normal imaged portions of the spleen, pancreas, adrenal glands, left kidney. Right renal sinus cysts. Upper pole subcentimeter low-density right renal lesion is too small to characterize but most likely a cyst. No specific follow-up indicated. Musculoskeletal: Intact sternotomy wires. Remote right rib fractures. Accentuation of expected thoracic kyphosis. Moderate T3 compression deformity is similar. No change in a mild T1 compression deformity. IMPRESSION: 1. Mild motion and patient arm position degradation. 2. Small, right larger than left pleural effusions with bibasilar atelectasis. 3. Pulmonary artery enlargement suggests pulmonary arterial hypertension. Electronically Signed   By: Jeronimo Greaves M.D.   On: 02/17/2024 12:22   Scheduled Meds:  apixaban  2.5 mg Oral BID   cephALEXin  500 mg Oral Q12H   digoxin  125 mcg Oral Daily   diltiazem  30 mg Oral Q6H   famotidine  20 mg Oral QHS   levothyroxine  50 mcg Oral QAC breakfast   PARoxetine  20 mg Oral q AM   potassium chloride  40 mEq Oral Once   QUEtiapine  50 mg Oral QHS   Continuous Infusions:  LOS: 0 days    Time spent: 50 Mins    Willeen Niece, MD [facility]   If 7PM-7AM, please contact nNo edema, no cyanosis, no clubbingDeconditioned, not in any acute distress.ight-coverage

## 2024-02-18 NOTE — Consult Note (Addendum)
 Cardiology Consultation   Patient ID: Emily Wagner MRN: [id number]; DOB: 10-25-1933  Admit date: 02/17/2024 Date of Consult: 02/18/2024  PCP:  [name], Elige Radon, MD   [facility] Providers Cardiologist: New to Dr Bjorn Pippin  Patient Profile:   Emily Wagner is a 89 y.o. female with a hx of CAD with CABG,chronic diastolic heart failure, paroxysmal A fib, anxiety with depression, HTN,  HLD, CVAs with memory loss, hypothyroidism, LBBB, who is being seen 02/18/2024 for the evaluation of CHF and A fib RVR at the request of Dr Idelle Leech.  History of Present Illness:   Emily Wagner with above PMH presented to ER yesterday for weakness and elevated HR. She was seen by ER and started on Keflex for UTI on 02/15/24. She was feeling increased fatigue and weakness yesterday. EMS was called and found patient in A fib RVR 150s. She received IV cardizem bolus with improved HR. She has some baseline memory issues from strokes in the past.  Her daughter reported that patient recently suffered loss of her sister and best friend.   Daughter reports that patient had 1-2 weeks of intermittent A fib RVR episodes. She states patient had complained increased SOB with minor exertion, dizziness, and fatigue at times. She has not complained any chest pain. She is taking her routine digoxin, eiquis, and diltiazem. She was on diuretic years ago for CHF, this was taken off by Dr Antoine Poche. She was last seen 04/18/23 by Dr Antoine Poche, stable from CAD and A fib standpoint. She has upcoming appointment this Wednesday.   Per chart review, she had a cardiac cath on 10/29/2015 by Dr Katrinka Blazing, revealing severe 3 vessel CAD including 60% mid to dist RCA, 75% ost to prox LAD, 90% prox to mid LAD, 75% D2, 75% ost D1. She underwent CABG x1 with LIMA to distal LAD on 11/02/2015 by Dr Cornelius Moras. Echo from 2017 showed LVEF 72%, grade I DD.  Echo from [facility] on 03/28/21 showed LVEF 60-65%, mild MR. Event monitor from 06/2021 showed A fib  burden <1%. She historically takes cardizem 30mg  BID, digoxin daily, and eliquis 2.5mg  BID for A fib. She has a large list of drug allergy, had fatigue from beta blocker in the past.    She was admitted to hospitalist for A fib RVR, started on diltiazem gtt which has been stopped now with episode of slowed A fib, PO cardizem increased from 30mg  BID to 30mg  q6h currently.   Diagnostic workup from 3/23 showed unremarkable BMP. Hs trop 41>44. BNP 896. Hgb 10.7. Digoxin level therapeutic. CT chest showed Small, right larger than left pleural effusions with bibasilar atelectasis. Pulmonary artery enlargement suggests pulmonary arterial hypertension. Labs today showed elevated Cr 1.15 and GFR 46.   Echo today showed LV dyssynchrony due to LBBB, LVEF 40-45%, global hypokinesis, mild LVH, mildly reduced RV, RVSP 36.13mmHg, severe LAE, mod to severe MR, mild to mod TR, probable moderate low-gradient, low-flow AS with VTI 1.38 cm2, mean gradient 5.7 mmHg, Vmax 1.58 m/s. Trivial AI.          Past Medical History:  Diagnosis Date   Adjustment disorder with mixed anxiety and depressed mood    Atrial fibrillation (HCC) 11/04/2015   Post-operative   Benign hypertensive heart disease without heart failure    CAD (coronary artery disease)    Infection of urinary tract 10/31/2015   Interstitial cystitis    Osteopenia    Other and unspecified hyperlipidemia    Other dyspnea and  respiratory abnormality    Pelvic fracture (HCC) 05/30/2015   Post-polio syndrome    polio age 53   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   LIMA to LAD   Stroke [facility])     Past Surgical History:  Procedure Laterality Date   ABDOMINAL HYSTERECTOMY     Cancer and endometriosis   CARDIAC CATHETERIZATION  2008   CARDIAC CATHETERIZATION N/A 10/29/2015   Procedure: Left Heart Cath and Coronary Angiography;  Surgeon: Lyn Records, MD;  Location: [facility] INVASIVE CV LAB;  Service: Cardiovascular;  Laterality: N/A;   CATARACT  EXTRACTION     CHOLECYSTECTOMY     CORONARY ARTERY BYPASS GRAFT N/A 11/02/2015   Procedure: OFF PUMP CORONARY ARTERY BYPASS GRAFTING (CABG) TIMES ONE;  Surgeon: Purcell Nails, MD;  Location: [facility] OR;  Service: Open Heart Surgery;  Laterality: N/A;  LIMA to LAD   FEMUR FRACTURE SURGERY  09/2015   right hip compression screws    FRACTURE SURGERY     Right hip   HERNIA REPAIR     Right inguinal   TEE WITHOUT CARDIOVERSION N/A 11/02/2015   Procedure: TRANSESOPHAGEAL ECHOCARDIOGRAM (TEE);  Surgeon: Purcell Nails, MD;  Location: [facility] OR;  Service: Open Heart Surgery;  Laterality: N/A;     Home Medications:  Prior to Admission medications   Medication Sig Start Date End Date Taking? Authorizing Provider  acetaminophen (TYLENOL) 325 MG tablet Take 325 mg by mouth every 6 (six) hours as needed.   Yes [provider]  apixaban (ELIQUIS) 2.5 MG TABS tablet Take 1 tablet (2.5 mg total) by mouth 2 (two) times daily. 10/13/22  Yes [name], MD  cephALEXin (KEFLEX) 500 MG capsule Take 1 capsule (500 mg total) by mouth 4 (four) times daily. 02/15/24  Yes Anders Simmonds T, DO  cholecalciferol (VITAMIN D3) 25 MCG (1000 UNIT) tablet Take 1,000 Units by mouth in the morning.   Yes [provider]  Cyanocobalamin (VITAMIN B-12 PO) Take 500 mcg by mouth in the morning.   Yes [provider]  digoxin (LANOXIN) 0.125 MG tablet TAKE 1 TABLET BY MOUTH DAILY Patient taking differently: Take 125 mcg by mouth every other day. 08/02/23  Yes [name], MD  diltiazem (CARDIZEM) 30 MG tablet Take 1 tablet (30 mg total) by mouth 2 (two) times daily. 02/11/24  Yes [name], MD  famotidine (PEPCID) 20 MG tablet TAKE 1 TABLET BY MOUTH TWICE A DAY Patient taking differently: Take 20 mg by mouth at bedtime. 01/28/24  Yes [name], Elige Radon, MD  levothyroxine (SYNTHROID) 50 MCG tablet Take 1 tablet (50 mcg total) by mouth daily before breakfast. 12/17/23  Yes [name], Elige Radon, MD   Multiple Vitamins-Minerals (PRESERVISION AREDS 2 PO) Take 1 capsule by mouth 2 (two) times daily.   Yes [provider]  PARoxetine (PAXIL) 20 MG tablet Take 1 tablet (20 mg total) by mouth daily. Patient taking differently: Take 20 mg by mouth in the morning. 01/04/24  Yes [name], Elige Radon, MD  QUEtiapine (SEROQUEL) 50 MG tablet TAKE 1 TABLET BY MOUTH EVERYDAY AT BEDTIME Patient taking differently: Take 50 mg by mouth at bedtime. 01/28/24  Yes [name], Elige Radon, MD    Inpatient Medications: Scheduled Meds:  apixaban  2.5 mg Oral BID   cephALEXin  500 mg Oral Q12H   digoxin  125 mcg Oral Daily   famotidine  20 mg Oral QHS   levothyroxine  50 mcg Oral QAC breakfast   metoprolol tartrate  25 mg Oral BID   PARoxetine  20 mg Oral q AM   potassium chloride  40 mEq Oral Once   QUEtiapine  50 mg Oral QHS   Continuous Infusions:  PRN Meds: acetaminophen **OR** acetaminophen, mouth rinse  Allergies:    Allergies  Allergen Reactions   Nitrofurantoin Other (See Comments)    Liver failure   Nsaids Other (See Comments)    Kidney failure   Other Other (See Comments)    Most antibiotics require supervision per patient   Beta Adrenergic Blockers Other (See Comments)    fatigue   Flonase [Fluticasone Propionate] Other (See Comments)    headache   Prevnar 13 [Pneumococcal 13-Val Conj Vacc] Other (See Comments)    Nerve damage in left arm   Propranolol Hcl Other (See Comments)    Unknown   Hydrocodone Other (See Comments)   Alendronate Sodium Other (See Comments)   Ciprofloxacin Other (See Comments)    Patient denies allergy--"I take Cipro for bladder infections"   Codeine Other (See Comments)   Crestor [Rosuvastatin Calcium] Other (See Comments)   Darvon Other (See Comments)   Lipitor [Atorvastatin Calcium] Other (See Comments)   Nortriptyline Hcl Other (See Comments)    States she felt like worms where crawling under her skin.   Potassium-Containing Compounds     Rash  and raw areas / sore breast    Pravachol Other (See Comments)   Sulfa Antibiotics Other (See Comments)   Sulfamethoxazole Other (See Comments)    Unknown   Ultram [Tramadol Hcl] Itching   Welchol [Colesevelam Hcl] Other (See Comments)   Zetia [Ezetimibe] Other (See Comments)   Zocor [Simvastatin] Other (See Comments)    Social History:   Social History   Socioeconomic History   Marital status: Widowed    Spouse name: Not on file   Number of children: 2   Years of education: Not on file   Highest education level: High school graduate  Occupational History   Occupation: retired    Associate Professor: [employer]    Comment: admin. assistant  Tobacco Use   Smoking status: Never   Smokeless tobacco: Never  Vaping Use   Vaping status: Never Used  Substance and Sexual Activity   Alcohol use: No   Drug use: No   Sexual activity: Not on file  Other Topics Concern   Not on file  Social History Narrative   01/04/22 lives alone   Daughter and granddaughter live close by and visit daily   Right Handed   Drinks no caffeine daily   Social Drivers of Health   Financial Resource Strain: Low Risk  (01/15/2024)   Overall Financial Resource Strain (CARDIA)    Difficulty of Paying Living Expenses: Not hard at all  Food Insecurity: No Food Insecurity (01/15/2024)   Hunger Vital Sign    Worried About Running Out of Food in the Last Year: Never true    Ran Out of Food in the Last Year: Never true  Transportation Needs: No Transportation Needs (01/15/2024)   PRAPARE - Administrator, Civil Service (Medical): No    Lack of Transportation (Non-Medical): No  Physical Activity: Inactive (01/15/2024)   Exercise Vital Sign    Days of Exercise per Week: 0 days    Minutes of Exercise per Session: 0 min  Stress: No Stress Concern Present (01/15/2024)   [employer] of Occupational Health - Occupational Stress Questionnaire    Feeling of Stress : Not at all  Social  Connections: Moderately  Integrated (01/15/2024)   Social Connection and Isolation Panel [NHANES]    Frequency of Communication with Friends and Family: More than three times a week    Frequency of Social Gatherings with Friends and Family: Three times a week    Attends Religious Services: More than 4 times per year    Active Member of Clubs or Organizations: Yes    Attends Banker Meetings: More than 4 times per year    Marital Status: Widowed  Intimate Partner Violence: Not At Risk (01/15/2024)   Humiliation, Afraid, Rape, and Kick questionnaire    Fear of Current or Ex-Partner: No    Emotionally Abused: No    Physically Abused: No    Sexually Abused: No    Family History:    Family History  Problem Relation Age of Onset   CAD Mother 75       Died suddenly   CAD Father 82       Died suddenly   CAD Daughter 55   Diabetes type I Daughter        Since age 87   Heart disease Sister    Osteoporosis Sister      ROS:  Constitutional: Denied fever, chills, malaise, night sweats Eyes: Denied vision change or loss Ears/Nose/Mouth/Throat: Denied ear ache, sore throat, coughing, sinus pain Cardiovascular: see HPI  Respiratory: see HPI  Gastrointestinal: Denied nausea, vomiting, abdominal pain, diarrhea Genital/Urinary: Denied dysuria, hematuria, urinary frequency/urgency Musculoskeletal: generalized weakness  Skin: Denied rash, wound Neuro: memory loss  Psych: history of depression/anxiety  Endocrine: Denied history of diabetes   Physical Exam/Data:   Vitals:   02/18/24 1151 02/18/24 1200 02/18/24 1300 02/18/24 1400  BP: 123/62     Pulse: 66 76 70 64  Resp: 17 15 17 20   Temp: 98.4 F (36.9 C)     TempSrc: Oral     SpO2: 95% 95% 95% 94%  Weight:      Height:        Intake/Output Summary (Last 24 hours) at 02/18/2024 1549 Last data filed at 02/18/2024 1330 Gross per 24 hour  Intake 587.12 ml  Output 500 ml  Net 87.12 ml      02/18/2024    4:56 AM 02/17/2024    8:45 PM 02/17/2024    11:27 AM  Last 3 Weights  Weight (lbs) 113 lb 8.6 oz 114 lb 3.2 oz 116 lb 13.5 oz  Weight (kg) 51.5 kg 51.8 kg 53 kg     Body mass index is 19.49 kg/m.   Vitals:  Vitals:   02/18/24 1300 02/18/24 1400  BP:    Pulse: 70 64  Resp: 17 20  Temp:    SpO2: 95% 94%   General Appearance: In no apparent distress, laying in bed, frail elderly  HEENT: Normocephalic, atraumatic. HOH Neck: Supple, trachea midline, elevated JVDs Cardiovascular: Irregularly irregular,  normal S1-S2,  soft murmur LUSB  Respiratory: Resting breathing unlabored, lungs sounds clear but diminished at base to auscultation bilaterally, no use of accessory muscles. On room air.  Gastrointestinal: Bowel sounds positive, abdomen soft, non-tender Extremities: Able to move all extremities in bed without difficulty, trace edema of BLE  Musculoskeletal: mild muscular atrophy  Skin: Intact, warm, dry.  Neurologic: Alert, oriented to person, place and time. HOH. Memory loss. No focal deficit  Psychiatric: Normal affect. Mood is appropriate.    EKG:  The EKG was personally reviewed and demonstrates:    EKG 02/15/24 showed A fib RVR 122  bpm, LBBB  Telemetry:  Telemetry was personally reviewed and demonstrates:    A FIB with ventricular rate  from 50-70s   Relevant CV Studies:   Echo from today showed:  1. LV dyssynchrony due to LBBB. Left ventricular ejection fraction, by  estimation, is 40 to 45%. The left ventricle has mildly decreased  function. The left ventricle demonstrates global hypokinesis. There is  mild concentric left ventricular hypertrophy.   Left ventricular diastolic parameters are indeterminate.   2. Right ventricular systolic function is mildly reduced. The right  ventricular size is normal. There is mildly elevated pulmonary artery  systolic pressure. The estimated right ventricular systolic pressure is  36.5 mmHg.   3. Left atrial size was severely dilated.   4. The mitral valve is  degenerative. Moderate to severe mitral valve  regurgitation. No evidence of mitral stenosis.   5. Tricuspid valve regurgitation is mild to moderate.   6. Probable moderate low-gradient, low-flow AS. The non-coronary cusp is  fixed but the RCC and LCC appears to open adequately. The aortic valve is  tricuspid. There is moderate calcification of the aortic valve. Aortic  valve regurgitation is trivial.  Moderate aortic valve stenosis. Aortic valve area, by VTI measures 1.38  cm. Aortic valve mean gradient measures 5.7 mmHg. Aortic valve Vmax  measures 1.58 m/s.   7. The inferior vena cava is normal in size with <50% respiratory  variability, suggesting right atrial pressure of 8 mmHg.    Event monitor from 06/2021  A fib burden <1%   Laboratory Data:  High Sensitivity Troponin:   Recent Labs  Lab 02/15/24 1402 02/15/24 1621 02/17/24 1129 02/17/24 1400  TROPONINIHS 39* 42* 41* 44*     Chemistry Recent Labs  Lab 02/15/24 1306 02/15/24 1402 02/17/24 1129 02/18/24 0519  NA 140  --  140 137  K 4.2  --  4.3 3.5  CL 105  --  106 107  CO2 26  --  24 23  GLUCOSE 104*  --  106* 89  BUN 23  --  21 20  CREATININE 0.90  --  0.93 1.15*  CALCIUM 9.8  --  8.9 8.2*  MG  --  2.0 1.7 1.7  GFRNONAA >60  --  59* 46*  ANIONGAP 9  --  10 7    Recent Labs  Lab 02/15/24 1402  PROT 7.0  ALBUMIN 4.0  AST 18  ALT 8  ALKPHOS 107  BILITOT 1.3*   Lipids No results for input(s): "CHOL", "TRIG", "HDL", "LABVLDL", "LDLCALC", "CHOLHDL" in the last 168 hours.  Hematology Recent Labs  Lab 02/15/24 1306 02/17/24 1129 02/18/24 0519  WBC 6.7 8.2 6.2  RBC 3.90 3.57* 3.35*  HGB 11.6* 10.7* 10.1*  HCT 36.1 33.8* 30.9*  MCV 92.6 94.7 92.2  MCH 29.7 30.0 30.1  MCHC 32.1 31.7 32.7  RDW 18.5* 18.7* 18.7*  PLT 246 236 209   Thyroid  Recent Labs  Lab 02/15/24 1437  TSH 1.861    BNP Recent Labs  Lab 02/17/24 1630  BNP 896.7*    DDimer No results for input(s): "DDIMER" in the last  168 hours.   Radiology/Studies:  ECHOCARDIOGRAM COMPLETE Result Date: 02/18/2024    ECHOCARDIOGRAM REPORT   Patient Name:   Emily Wagner Date of Exam: 02/18/2024 Medical Rec #:  [id number]         Height:       64.0 in Accession #:    [id number]  Weight:       113.5 lb Date of Birth:  08/13/1933         BSA:          1.538 m Patient Age:    89 years          BP:           134/76 mmHg Patient Gender: F                 HR:           65 bpm. Exam Location:  Inpatient Procedure: 2D Echo, Cardiac Doppler and Color Doppler (Both Spectral and Color            Flow Doppler were utilized during procedure). Indications:    Atrial fibrillation  History:        Patient has prior history of Echocardiogram examinations, most                 recent 01/26/2016. CHF, CAD, Stroke, Arrythmias:LBBB; Risk                 Factors:Hypertension.  Sonographer:    Vern Claude Referring Phys: [id number] ALLISON WOLFE IMPRESSIONS  1. LV dyssynchrony due to LBBB. Left ventricular ejection fraction, by estimation, is 40 to 45%. The left ventricle has mildly decreased function. The left ventricle demonstrates global hypokinesis. There is mild concentric left ventricular hypertrophy.  Left ventricular diastolic parameters are indeterminate.  2. Right ventricular systolic function is mildly reduced. The right ventricular size is normal. There is mildly elevated pulmonary artery systolic pressure. The estimated right ventricular systolic pressure is 36.5 mmHg.  3. Left atrial size was severely dilated.  4. The mitral valve is degenerative. Moderate to severe mitral valve regurgitation. No evidence of mitral stenosis.  5. Tricuspid valve regurgitation is mild to moderate.  6. Probable moderate low-gradient, low-flow AS. The non-coronary cusp is fixed but the RCC and LCC appears to open adequately. The aortic valve is tricuspid. There is moderate calcification of the aortic valve. Aortic valve regurgitation is trivial. Moderate aortic valve  stenosis. Aortic valve area, by VTI measures 1.38 cm. Aortic valve mean gradient measures 5.7 mmHg. Aortic valve Vmax measures 1.58 m/s.  7. The inferior vena cava is normal in size with <50% respiratory variability, suggesting right atrial pressure of 8 mmHg. FINDINGS  Left Ventricle: LV dyssynchrony due to LBBB. Left ventricular ejection fraction, by estimation, is 40 to 45%. The left ventricle has mildly decreased function. The left ventricle demonstrates global hypokinesis. The left ventricular internal cavity size  was normal in size. There is mild concentric left ventricular hypertrophy. Abnormal (paradoxical) septal motion, consistent with left bundle branch block. Left ventricular diastolic parameters are indeterminate. Right Ventricle: The right ventricular size is normal. No increase in right ventricular wall thickness. Right ventricular systolic function is mildly reduced. There is mildly elevated pulmonary artery systolic pressure. The tricuspid regurgitant velocity  is 2.67 m/s, and with an assumed right atrial pressure of 8 mmHg, the estimated right ventricular systolic pressure is 36.5 mmHg. Left Atrium: Left atrial size was severely dilated. Right Atrium: Right atrial size was normal in size. Pericardium: There is no evidence of pericardial effusion. Mitral Valve: The mitral valve is degenerative in appearance. Moderate to severe mitral valve regurgitation. No evidence of mitral valve stenosis. MV peak gradient, 4.5 mmHg. The mean mitral valve gradient is 1.0 mmHg. Tricuspid Valve: The tricuspid valve is normal in structure. Tricuspid valve regurgitation is mild to moderate. No  evidence of tricuspid stenosis. Aortic Valve: Probable moderate low-gradient, low-flow AS. The non-coronary cusp is fixed but the RCC and LCC appears to open adequately. The aortic valve is tricuspid. There is moderate calcification of the aortic valve. Aortic valve regurgitation is trivial. Moderate aortic stenosis is  present. Aortic valve mean gradient measures 5.7 mmHg. Aortic valve peak gradient measures 9.9 mmHg. Aortic valve area, by VTI measures 1.38 cm. Pulmonic Valve: The pulmonic valve was normal in structure. Pulmonic valve regurgitation is not visualized. No evidence of pulmonic stenosis. Aorta: The aortic root is normal in size and structure. Venous: The inferior vena cava is normal in size with less than 50% respiratory variability, suggesting right atrial pressure of 8 mmHg. IAS/Shunts: No atrial level shunt detected by color flow Doppler.  LEFT VENTRICLE PLAX 2D LVIDd:         4.40 cm      Diastology LVIDs:         3.20 cm      LV e' medial:    4.03 cm/s LV PW:         0.70 cm      LV E/e' medial:  25.8 LV IVS:        0.80 cm      LV e' lateral:   7.01 cm/s LVOT diam:     1.90 cm      LV E/e' lateral: 14.8 LV SV:         33 LV SV Index:   22 LVOT Area:     2.84 cm  LV Volumes (MOD) LV vol d, MOD A2C: 125.0 ml LV vol d, MOD A4C: 144.0 ml LV vol s, MOD A2C: 46.3 ml LV vol s, MOD A4C: 68.2 ml LV SV MOD A2C:     78.7 ml LV SV MOD A4C:     144.0 ml LV SV MOD BP:      79.6 ml RIGHT VENTRICLE             IVC RV Basal diam:  3.50 cm     IVC diam: 1.10 cm RV Mid diam:    2.30 cm RV S prime:     10.90 cm/s TAPSE (M-mode): 1.8 cm LEFT ATRIUM              Index        RIGHT ATRIUM           Index LA diam:        3.70 cm  2.41 cm/m   RA Area:     14.70 cm LA Vol (A2C):   120.0 ml 78.03 ml/m  RA Volume:   30.00 ml  19.51 ml/m LA Vol (A4C):   62.7 ml  40.77 ml/m LA Biplane Vol: 91.4 ml  59.43 ml/m  AORTIC VALVE                     PULMONIC VALVE AV Area (Vmax):    1.25 cm      PV Vmax:       0.80 m/s AV Area (Vmean):   1.21 cm      PV Peak grad:  2.6 mmHg AV Area (VTI):     1.38 cm AV Vmax:           157.67 cm/s AV Vmean:          107.833 cm/s AV VTI:            0.242 m AV Peak Grad:  9.9 mmHg AV Mean Grad:      5.7 mmHg LVOT Vmax:         69.70 cm/s LVOT Vmean:        45.900 cm/s LVOT VTI:          0.118 m LVOT/AV  VTI ratio: 0.49  AORTA Ao Root diam: 3.00 cm Ao Asc diam:  3.30 cm MITRAL VALVE                TRICUSPID VALVE MV Area (PHT): 6.83 cm     TR Peak grad:   28.5 mmHg MV Area VTI:   1.33 cm     TR Vmax:        267.00 cm/s MV Peak grad:  4.5 mmHg MV Mean grad:  1.0 mmHg     SHUNTS MV Vmax:       1.06 m/s     Systemic VTI:  0.12 m MV Vmean:      51.7 cm/s    Systemic Diam: 1.90 cm MV Decel Time: 111 msec MV E velocity: 104.00 cm/s Arvilla Meres MD Electronically signed by Arvilla Meres MD Signature Date/Time: 02/18/2024/11:36:27 AM    Final    CT CHEST WO CONTRAST Result Date: 02/17/2024 CLINICAL DATA:  Chronic cough. Recent diagnosis of urinary tract infection. Weakness. Dementia. EXAM: CT CHEST WITHOUT CONTRAST TECHNIQUE: Multidetector CT imaging of the chest was performed following the standard protocol without IV contrast. RADIATION DOSE REDUCTION: This exam was performed according to the departmental dose-optimization program which includes automated exposure control, adjustment of the mA and/or kV according to patient size and/or use of iterative reconstruction technique. COMPARISON:  Chest radiograph 2 days ago.  CT of 12/11/2023 FINDINGS: Cardiovascular: Mild motion and patient arm position degradation. Tortuous thoracic aorta. Aortic atherosclerosis. Moderate cardiomegaly, without pericardial effusion. Median sternotomy for CABG. Aortic valve calcifications are nonspecific in this age group. Pulmonary artery enlargement, outflow tract 3.2 cm. Mediastinum/Nodes: No mediastinal or hilar adenopathy, given limitations of unenhanced CT. Tiny hiatal hernia. Lungs/Pleura: Small bilateral pleural effusions, increased on the right and new on the left. Right hemidiaphragm elevation.  Mild right base atelectasis. Minimal dependent left base subsegmental atelectasis. Upper Abdomen: Cholecystectomy. Subcentimeter low-density right hepatic lobe lesion is similar and likely a cyst. Normal imaged portions of the  spleen, pancreas, adrenal glands, left kidney. Right renal sinus cysts. Upper pole subcentimeter low-density right renal lesion is too small to characterize but most likely a cyst. No specific follow-up indicated. Musculoskeletal: Intact sternotomy wires. Remote right rib fractures. Accentuation of expected thoracic kyphosis. Moderate T3 compression deformity is similar. No change in a mild T1 compression deformity. IMPRESSION: 1. Mild motion and patient arm position degradation. 2. Small, right larger than left pleural effusions with bibasilar atelectasis. 3. Pulmonary artery enlargement suggests pulmonary arterial hypertension. Electronically Signed   By: Jeronimo Greaves M.D.   On: 02/17/2024 12:22   DG Chest Port 1 View Result Date: 02/15/2024 CLINICAL DATA:  Shortness of breath EXAM: PORTABLE CHEST 1 VIEW COMPARISON:  10/23/2018, CT 12/11/2023 FINDINGS: Post sternotomy changes. Mild chronic elevation of the right diaphragm. Probable pleuroparenchymal scarring at the left base. Small right-sided pleural effusion with right basilar airspace disease. Linear scarring in the right mid lung. Irregular airspace opacity at the right apex. IMPRESSION: Small right pleural effusion with right basilar airspace disease, atelectasis versus pneumonia. Irregular airspace opacity at the right apex, not definitively seen on January CT comparison, question pneumonia, but mass not excluded. Radiographic follow-up to resolution is recommended, alternatively  CT could be obtained for further assessment. Electronically Signed   By: Jasmine Pang M.D.   On: 02/15/2024 16:18     Assessment and Plan:   Paroxysmal A fib with RVR - TSH WNL - Echo showed LV dyssynchrony due to LBBB, LVEF 40-45%, global hypokinesis, mild LVH, mildly reduced RV, RVSP 36.37mmHg, severe LAE, mod to severe MR, mild to mod TR, probable moderate low-gradient, low-flow AS with VTI 1.38 cm2, mean gradient 5.7 mmHg, Vmax 1.58 m/s. Trivial AI.   - now in rate  controlled A fib 50-70s, cardizem gtt stopped, will stop cardizem 30mg  q6h given low EF - continue digoxin daily - start metoprolol 25mg  BID for rate control - continue PTA eliquis 2.5mg  BID   Acute HFrEF - BNP 896 - CT chest Small, right larger than left pleural effusions with bibasilar atelectasis. Pulmonary artery enlargement suggests pulmonary arterial hypertension. - LVEF 40-45% on Echo, see Echo as above  - clinically mildly hypervolemic today, would trial diuresis with IV Lasix 40mg  x1 - etiology suspect tachycardia mediated + /- LBBB   - GDMT: continue digoxin, start metoprolol XL (convert at time of DC), may trial SGLT2I if renal function stable/patient wishes   Elevated troponin  CAD with hx of CABG x1 2016 - Hs trop 40s - no chest pain - no further workup or escalation of care at this time      Risk Assessment/Risk Scores:    New York Heart Association (NYHA) Functional Class NYHA Class II  CHA2DS2-VASc Score = 5   This indicates a 7.2% annual risk of stroke. The patient's score is based upon: CHF History: 0 HTN History: 1 Diabetes History: 0 Stroke History: 0 Vascular Disease History: 1 Age Score: 2 Gender Score: 1     For questions or updates, please contact [facility] Please consult [email or web address] for contact info under    Signed, Cyndi Bender, NP  02/18/2024 3:49 PM   Patient seen and examined.  Agree with above documentation.  Mr. Mclees is an 89 year old female with history of CAD status post CABG, chronic diastolic heart failure, paroxysmal atrial fibrillation, CVA, hypothyroidism who were consulted by Dr. Idelle Leech for evaluation of atrial fibrillation.  She presented to ED on 3/21 with UTI, treated with Keflex.  Presented to ED yesterday with weakness and tachycardia.  She was found to be in A-fib with RVR with rates up to 150s.  She was started on IV diltiazem with improvement in heart rate.  She has known A-fib, and home regimen  includes Cardizem 30 mg twice daily, digoxin 125 mcg daily, and Eliquis 2.5 mg twice daily.  Previously was on metoprolol but discontinued due to fatigue.  Labs notable for creatinine 0.9, troponin 41 > 44, BNP 897.  Echocardiogram shows EF 40 to 45%, LV dyssynchrony due to left bundle branch block, mild RV dysfunction, mildly elevated pulmonary pressures, severe left atrial enlargement, moderate to severe MR, moderate low-flow low gradient aortic stenosis. On exam, patient is alert and oriented, regular rate and rhythm, no murmurs, lungs CTAB, no LE edema or JVD.  For her atrial fibrillation, rates currently appear well-controlled.  Would favor switching from diltiazem to metoprolol given systolic dysfunction.  She has allergy listed to beta-blockers but on review of record appears was previously on metoprolol for years but discontinued due to fatigue.  Will trial metoprolol.  Can continue digoxin, recent level 0.7.  Continue Eliquis 2.5 mg twice daily for anticoagulation, reduced dose given age and weight.  For  her acute heart failure with reduced ejection fraction, EF 40 to 45% on echo.  Suspect due to atrial fibrillation with RVR.  She appears hypervolemic, will diurese with IV Lasix 40 mg x 1 today.  Little Ishikawa, MD

## 2024-02-18 NOTE — Progress Notes (Signed)
 Heart Failure Navigator Progress Note  Assessed for Heart & Vascular TOC clinic readiness.  Patient does not meet criteria due to per MD note patient with history of Dementia. .   Navigator will sign off at this time.   Rhae Hammock, BSN, Scientist, clinical (histocompatibility and immunogenetics) Only

## 2024-02-18 NOTE — Plan of Care (Signed)
  Problem: Clinical Measurements: Goal: Respiratory complications will improve Outcome: Progressing Goal: Cardiovascular complication will be avoided Outcome: Progressing   Problem: Coping: Goal: Level of anxiety will decrease Outcome: Progressing   

## 2024-02-18 NOTE — Care Management Obs Status (Signed)
 MEDICARE OBSERVATION STATUS NOTIFICATION   Patient Details  Name: Emily Wagner MRN: [id number] Date of Birth: 1933-02-22   Medicare Observation Status Notification Given:  Yes    Sherilyn Banker 02/18/2024, 1:22 PM

## 2024-02-18 NOTE — Progress Notes (Signed)
 PHARMACY NOTE:  ANTIMICROBIAL RENAL DOSAGE ADJUSTMENT  Current antimicrobial regimen includes a mismatch between antimicrobial dosage and estimated renal function.  As per policy approved by the Pharmacy & Therapeutics and Medical Executive Committees, the antimicrobial dosage will be adjusted accordingly.  Current antimicrobial dosage:  cephalexin 500mg  QID  Indication: unk  Renal Function:  Estimated Creatinine Clearance: 27.5 mL/min (A) (by C-G formula based on SCr of 1.15 mg/dL (H)). []      On intermittent HD, scheduled: []      On CRRT    Antimicrobial dosage has been changed to:  500mg  BID  Additional comments:   Thank you for allowing pharmacy to be a part of this patient's care.  Mosetta Anis, [facility] 02/18/2024 10:49 AM

## 2024-02-18 NOTE — TOC Initial Note (Signed)
 Transition of Care [facility]) - Initial/Assessment Note    Patient Details  Name: Emily Wagner MRN: [id number] Date of Birth: Nov 24, 1933  Transition of Care [facility]) CM/SW Contact:    Gala Lewandowsky, RN Phone Number: 02/18/2024, 3:37 PM  Clinical Narrative: Patient presented for Atrial Fib. PTA patient was from home. Daughter was at the bedside during the visit and she has been staying with the patient since Jan. Daughter states the patient is active with [facility]-disciplines are PT and Aide-adding CSW for assistance to help daughter regarding acquiring personal care services in the home. Patient will need resumption orders and F2F once stable. No further home needs identified at the time of the visit.           Expected Discharge Plan: Home w Home Health Services Barriers to Discharge: No Barriers Identified   Patient Goals and CMS Choice Patient states their goals for this hospitalization and ongoing recovery are:: plan home once   Choice offered to / list presented to : NA (Already active with [facility])     Expected Discharge Plan and Services   Discharge Planning Services: CM Consult Post Acute Care Choice: Home Health, Resumption of Svcs/PTA Provider Living arrangements for the past 2 months: Single Family Home  HH Arranged: PT, Nurse's Aide, Social Work [employer] Agency: [facility] Date HH Agency Contacted: 02/18/24 Time HH Agency Contacted: 1536 Representative spoke with at [facility] Agency: Clifton Custard  Prior Living Arrangements/Services Living arrangements for the past 2 months: Single Family Home Lives with:: Self (Daughter has been staying with the patient since Jan) Patient language and need for interpreter reviewed:: Yes Do you feel safe going back to the place where you live?: Yes      Need for Family Participation in Patient Care: Yes (Comment) Care giver support system in place?: Yes (comment)   Criminal Activity/Legal  Involvement Pertinent to Current Situation/Hospitalization: No - Comment as needed  Permission Sought/Granted Permission sought to share information with : Family Supports, Magazine features editor, Case Estate manager/land agent granted to share information with : Yes, Verbal Permission Granted     Permission granted to share info w AGENCY: [facility]    Emotional Assessment Appearance:: Appears stated age Attitude/Demeanor/Rapport: Engaged Affect (typically observed): Appropriate Orientation: : Oriented to Self Alcohol / Substance Use: Not Applicable Psych Involvement: No (comment)  Admission diagnosis:  Atrial fibrillation with RVR (HCC) [I48.91] Patient Active Problem List   Diagnosis Date Noted   Atrial fibrillation with RVR (HCC) 02/17/2024   Normocytic anemia 02/17/2024   Chronic diastolic CHF (congestive heart failure) (HCC) 02/17/2024   Falls frequently 11/12/2023   LBBB (left bundle branch block) 02/03/2023   Hx of ischemic right MCA stroke 07/04/2021   Left hemiparesis (HCC) 03/27/2021   Acquired hypothyroidism 03/27/2021   Aortic atherosclerosis (HCC) 12/12/2017   Atrial fibrillation (HCC) 11/04/2015   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   Unstable angina (HCC) 10/29/2015   CAD (coronary artery disease) 10/29/2015   Benign hypertensive heart disease without heart failure    Interstitial cystitis    S/P hip hemiarthroplasty 11/07/2012   Renal insufficiency    Adjustment disorder with mixed anxiety and depressed mood    Osteoporosis    ASCVD (arteriosclerotic cardiovascular disease)    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   HYPERCHOLESTEROLEMIA 05/02/2008   Essential hypertension 05/02/2008   DIVERTICULOSIS OF COLON 08/27/2003   PCP:  [name], Elige Radon, MD Pharmacy:   [facility] [phone] - [place], [place] - [phone]  [facility] [address] [place] [address] Phone: [phone] Fax: [phone]  [facility] - [place], [place] - [address] [place] [address] Phone: [phone] Fax: [phone]  Social Drivers of Health (SDOH) Social History: SDOH Screenings   Food Insecurity: No Food Insecurity (01/15/2024)  Housing: Unknown (01/15/2024)  Transportation Needs: No Transportation Needs (01/15/2024)  Utilities: Not At Risk (01/15/2024)  Alcohol Screen: Low Risk  (01/15/2024)  Depression (PHQ2-9): Low Risk  (01/28/2024)  Recent Concern: Depression (PHQ2-9) - Medium Risk (01/15/2024)  Financial Resource Strain: Low Risk  (01/15/2024)  Physical Activity: Inactive (01/15/2024)  Social Connections: Moderately Integrated (01/15/2024)  Stress: No Stress Concern Present (01/15/2024)  Tobacco Use: Low Risk  (02/17/2024)  Health Literacy: Adequate Health Literacy (01/15/2024)   Readmission Risk Interventions     No data to display

## 2024-02-18 NOTE — Progress Notes (Signed)
 TR[facility] night cross cover note:   I was notified by RN of potassium level of 3.5 this AM. I subsequently ordered kcl 40 meq po x 1 dose now, and added on a mag level.      Newton Pigg, DO Hospitalist

## 2024-02-19 ENCOUNTER — Telehealth: Payer: Self-pay | Admitting: *Deleted

## 2024-02-19 DIAGNOSIS — I4891 Unspecified atrial fibrillation: Secondary | ICD-10-CM | POA: Diagnosis not present

## 2024-02-19 DIAGNOSIS — I480 Paroxysmal atrial fibrillation: Secondary | ICD-10-CM | POA: Diagnosis not present

## 2024-02-19 DIAGNOSIS — I5021 Acute systolic (congestive) heart failure: Secondary | ICD-10-CM | POA: Diagnosis not present

## 2024-02-19 LAB — BASIC METABOLIC PANEL
Anion gap: 8 (ref 5–15)
BUN: 21 mg/dL (ref 8–23)
CO2: 25 mmol/L (ref 22–32)
Calcium: 8.5 mg/dL — ABNORMAL LOW (ref 8.9–10.3)
Chloride: 105 mmol/L (ref 98–111)
Creatinine, Ser: 1.36 mg/dL — ABNORMAL HIGH (ref 0.44–1.00)
GFR, Estimated: 37 mL/min — ABNORMAL LOW (ref >60)
Glucose, Bld: 95 mg/dL (ref 70–99)
Potassium: 3.7 mmol/L (ref 3.5–5.1)
Sodium: 138 mmol/L (ref 135–145)

## 2024-02-19 LAB — MAGNESIUM: Magnesium: 1.7 mg/dL (ref 1.7–2.4)

## 2024-02-19 MED ORDER — METOPROLOL SUCCINATE ER 50 MG PO TB24
50.0000 mg | ORAL_TABLET | Freq: Every day | ORAL | Status: DC
  Administered 2024-02-19 – 2024-02-20 (×2): 50 mg via ORAL
  Filled 2024-02-19 (×2): qty 1

## 2024-02-19 NOTE — Telephone Encounter (Signed)
 Spoke with [facility]) regarding patient's scheduled appointment with Dr. Antoine Poche in [place] on 02/20/24. She is aware this appointment will be canceled as the pt is currently hospitalized. Pt is scheduled for another OV appointment with Dr. Antoine Poche in [place] on 04/16/24. [name] verbalizes understanding and denies any questions at this time.

## 2024-02-19 NOTE — Progress Notes (Addendum)
 Patient Name: Emily Wagner Date of Encounter: 02/19/2024 [facility] Cardiologist: New to Dr Bjorn Pippin   Interval Summary  .    Minal Stuller is a 89 y.o. female with a hx of CAD with CABG,chronic diastolic heart failure, paroxysmal A fib, anxiety with depression, HTN,  HLD, CVAs with memory loss, hypothyroidism, LBBB, cardiology is following for CHF and A fib RVR.   Patient is sleeping, easily awakened to voice. She states she feels better, no longer feeling SOB, did not notice much fatigue today, denied chest pain.   Vital Signs .    Vitals:   02/18/24 2048 02/18/24 2332 02/19/24 0250 02/19/24 0500  BP: 120/73 105/84 120/81   Pulse: 86 91 (!) 38   Resp: 19 20 20    Temp: 98.4 F (36.9 C) 98.4 F (36.9 C) (!) 97.3 F (36.3 C)   TempSrc: Oral Oral Oral   SpO2: 94% 91% 90%   Weight:    49.4 kg  Height:        Intake/Output Summary (Last 24 hours) at 02/19/2024 0744 Last data filed at 02/19/2024 0636 Gross per 24 hour  Intake 152.13 ml  Output 2170 ml  Net -2017.87 ml      02/19/2024    5:00 AM 02/18/2024    4:56 AM 02/17/2024    8:45 PM  Last 3 Weights  Weight (lbs) 109 lb 113 lb 8.6 oz 114 lb 3.2 oz  Weight (kg) 49.442 kg 51.5 kg 51.8 kg      Telemetry/ECG    A fib with ventricular rate of 70s , slowed VR at 40-50s noted  - Personally Reviewed  Physical Exam .   GEN: No acute distress.  Oral cavity dry Neck: No JVD Cardiac: Irregularly irregular, soft murmur at LUSB  Respiratory: Clear to auscultation bilaterally. On room air. Speaks full sentence GI: Soft, nontender, non-distended  MS: Trace leg edema  Assessment & Plan .     Paroxysmal A fib with RVR - TSH WNL - Echo showed LV dyssynchrony due to LBBB, LVEF 40-45%, global hypokinesis, mild LVH, mildly reduced RV, RVSP 36.38mmHg, severe LAE, mod to severe MR, mild to mod TR, probable moderate low-gradient, low-flow AS with VTI 1.38 cm2, mean gradient 5.7 mmHg, Vmax 1.58 m/s. Trivial AI.   -  now in rate controlled A fib 50-70s, cardizem discontinued given reduced EF - continue digoxin daily, therapeutic level - started metoprolol 25mg  BID for rate control, will convert to Toprol XL 50mg  today (no reports of fatigue at this time/not true allergy) - continue PTA eliquis 2.5mg  BID    Acute HFrEF - BNP 896 - CT chest Small, right larger than left pleural effusions with bibasilar atelectasis. Pulmonary artery enlargement suggests pulmonary arterial hypertension. - LVEF 40-45% on Echo, see Echo as above  - felt volume overloaded, trial IV Lasix x1 yesterday, labs with mild AKI today, will hold further diuresis, encourage PO intake, monitor BMP  - etiology suspect tachycardia mediated + /- LBBB   - GDMT: continue digoxin, metoprolol XL 50mg ,  hold off trial SGLT2I while watch renal function    Elevated troponin  CAD with hx of CABG x1 2016 - Hs trop 40s - no chest pain - no further workup or escalation of care at this time   UTI Debility Memory loss Anxiety and depression  Hypothyroidism  Hx of CVAs - per primary team     For questions or updates, please contact [facility] Please  consult [email or web address] for contact info under        Signed, Cyndi Bender, NP   Patient seen and examined.  Agree with above documentation.  On exam, patient is alert and orientedx2 (thought she was at home), normal rate, irregular rhythm, no murmurs, lungs CTAB, no LE edema or JVD.  Telemetry reviewed, remains in rate controlled atrial fibrillation.  Will consolidate metoprolol to Toprol-XL 50 mg daily.  She was diuresed with IV Lasix yesterday, had bump in creatinine.  Would hold further diuresis today.  Little Ishikawa, MD

## 2024-02-19 NOTE — Progress Notes (Addendum)
 PROGRESS NOTE    Emily Wagner  XBJ:[id number] DOB: [birth year] DOA: 02/17/2024 PCP: [name], Elige Radon, MD   Brief Narrative:  This 89 y.o. female with medical history significant of paroxysmal Atrial fibrillation, anxiety and depression, HLD, hx. of CABG,  hx of CVA, dementia, hypothyroidism, LBBB who presented to ED with complaints of light headedness with exertion and atrial fib with RVR. Daughter states that patient lost her sister about 3 weeks ago and then her best friend lost her daughter, since Friday she has been going into A-fib with RVR and becoming symptomatic with these episodes.  She was seen in the ED on Friday and was found to have a UTI.  Her digoxin dose was increased to daily from every other day and she was sent home.  She continued to have symptomatic episodes with exertion which prompted her to come to the ED today. ED workup : CT chest shows small right larger than left pleural effusion.  Pulmonary artery hypertension.  Patient was started on Cardizem gtt. and she was admitted for further evaluation. Cardiology consulted.   Assessment & Plan:   Principal Problem:   Atrial fibrillation with RVR (HCC) Active Problems:   Essential hypertension   Adjustment disorder with mixed anxiety and depressed mood   CAD (coronary artery disease)   Hx of ischemic right MCA stroke   Acquired hypothyroidism   Normocytic anemia   Chronic diastolic CHF (congestive heart failure) (HCC)   Acute systolic heart failure (HCC)   A-fib with RVR: She presented with recurrent episodes of atrial fibrillation with RVR while exertion. She was initiated on Cardizem gtt and heart rate better controlled.  Echocardiogram shows LVEF 40 to 45%, LV dyssynchrony due to LBBB. Digoxin level within normal limits. Cardizem discontinued given reduced LVEF. Continue digoxin 125 mcg daily. Continue Toprol XL 50 mg daily.  Continue Eliquis 2.5 mg twice daily She has close f/u on Thursday with her  cardiologist.  Chronic diastolic CHF (congestive heart failure) (HCC) Echo in 2017 with normal EF and grade 1 DD She appears dry on exam. CT chest with bilateral small pleural effusion and cardiomegaly.  BNP 896 ,  Effusions likely from recurrent atrial fib with RVR Daily weights. Cardiology consulted.  Given trial IV Lasix once yesterday. Hold further diuresis given increasing serum creatinine.   Normocytic anemia No overt bleeding and denies bleeding  Check iron studies     Acquired hypothyroidism: Continue synthroid daily    CAD (coronary artery disease) Hx of CABG Continue medical management .   Adjustment disorder with mixed anxiety and depressed mood Continue paxil .   Essential hypertension Continue Toprol XL 50 mg daily.    DVT prophylaxis: Eliquis Code Status: (DNR Family Communication: No family at bedside Disposition Plan:    Status is: Observation The patient remains OBS appropriate and will d/c before 2 midnights.   Admitted for A-fib with RVR.  Anticipated discharge home tomorrow  Consultants:  None  Procedures: Echocardiogram  Antimicrobials: Anti-infectives (From admission, onward)    Start     Dose/Rate Route Frequency Ordered Stop   02/18/24 2200  cephALEXin (KEFLEX) capsule 500 mg        500 mg Oral Every 12 hours 02/18/24 1052     02/17/24 1800  cephALEXin (KEFLEX) capsule 500 mg  Status:  Discontinued        500 mg Oral 4 times daily 02/17/24 1743 02/18/24 1052      Subjective: Patient was seen and examined at  bedside.  Overnight events noted. Patient appears very deconditioned,  reports doing better. Denies any shortness of breath, reports fatigue has improved, denies chest pain   Objective: Vitals:   02/19/24 0250 02/19/24 0500 02/19/24 0833 02/19/24 1348  BP: 120/81   120/88  Pulse: (!) 38  69 75  Resp: 20  19   Temp: (!) 97.3 F (36.3 C)  97.6 F (36.4 C) 97.6 F (36.4 C)  TempSrc: Oral  Oral Oral  SpO2: 90%      Weight:  49.4 kg    Height:        Intake/Output Summary (Last 24 hours) at 02/19/2024 1432 Last data filed at 02/19/2024 0636 Gross per 24 hour  Intake --  Output 1770 ml  Net -1770 ml   Filed Weights   02/17/24 2045 02/18/24 0456 02/19/24 0500  Weight: 51.8 kg 51.5 kg 49.4 kg    Examination:  General exam: Appears calm and comfortable, deconditioned, Not in distress. Respiratory system: CTA Bilaterally. Respiratory effort normal. RR 16 Cardiovascular system: S1 & S2 heard, RRR. No JVD, murmurs, rubs, gallops or clicks.  Gastrointestinal system: Abdomen is non distended, soft and nontender. Normal bowel sounds heard. Central nervous system: Alert and oriented X 3. No focal neurological deficits. Extremities: No edema, no cyanosis, no clubbing  Skin: No rashes, lesions or ulcers Psychiatry: Judgement and insight appear normal. Mood & affect appropriate.     Data Reviewed: I have personally reviewed following labs and imaging studies  CBC: Recent Labs  Lab 02/15/24 1306 02/17/24 1129 02/18/24 0519  WBC 6.7 8.2 6.2  HGB 11.6* 10.7* 10.1*  HCT 36.1 33.8* 30.9*  MCV 92.6 94.7 92.2  PLT 246 236 209   Basic Metabolic Panel: Recent Labs  Lab 02/15/24 1306 02/15/24 1402 02/17/24 1129 02/18/24 0519 02/19/24 0422  NA 140  --  140 137 138  K 4.2  --  4.3 3.5 3.7  CL 105  --  106 107 105  CO2 26  --  24 23 25   GLUCOSE 104*  --  106* 89 95  BUN 23  --  21 20 21   CREATININE 0.90  --  0.93 1.15* 1.36*  CALCIUM 9.8  --  8.9 8.2* 8.5*  MG  --  2.0 1.7 1.7 1.7   GFR: Estimated Creatinine Clearance: 22.3 mL/min (A) (by C-G formula based on SCr of 1.36 mg/dL (H)). Liver Function Tests: Recent Labs  Lab 02/15/24 1402  AST 18  ALT 8  ALKPHOS 107  BILITOT 1.3*  PROT 7.0  ALBUMIN 4.0   Recent Labs  Lab 02/15/24 1402  LIPASE 25   No results for input(s): "AMMONIA" in the last 168 hours. Coagulation Profile: No results for input(s): "INR", "PROTIME" in the last  168 hours. Cardiac Enzymes: No results for input(s): "CKTOTAL", "CKMB", "CKMBINDEX", "TROPONINI" in the last 168 hours. BNP (last 3 results) No results for input(s): "PROBNP" in the last 8760 hours. HbA1C: No results for input(s): "HGBA1C" in the last 72 hours. CBG: Recent Labs  Lab 02/15/24 1313  GLUCAP 86   Lipid Profile: No results for input(s): "CHOL", "HDL", "LDLCALC", "TRIG", "CHOLHDL", "LDLDIRECT" in the last 72 hours. Thyroid Function Tests: No results for input(s): "TSH", "T4TOTAL", "FREET4", "T3FREE", "THYROIDAB" in the last 72 hours.  Anemia Panel: Recent Labs    02/17/24 1553  VITAMINB12 1,691*  FERRITIN 54  TIBC 274  IRON 42   Sepsis Labs: No results for input(s): "PROCALCITON", "LATICACIDVEN" in the last 168 hours.  No  results found for this or any previous visit (from the past 240 hours).   Radiology Studies: ECHOCARDIOGRAM COMPLETE Result Date: 02/18/2024    ECHOCARDIOGRAM REPORT   Patient Name:   Emily Wagner Date of Exam: 02/18/2024 Medical Rec #:  [id number]         Height:       64.0 in Accession #:    [id number]        Weight:       113.5 lb Date of Birth:  28-Dec-1932         BSA:          1.538 m Patient Age:    89 years          BP:           134/76 mmHg Patient Gender: F                 HR:           65 bpm. Exam Location:  Inpatient Procedure: 2D Echo, Cardiac Doppler and Color Doppler (Both Spectral and Color            Flow Doppler were utilized during procedure). Indications:    Atrial fibrillation  History:        Patient has prior history of Echocardiogram examinations, most                 recent 01/26/2016. CHF, CAD, Stroke, Arrythmias:LBBB; Risk                 Factors:Hypertension.  Sonographer:    Vern Claude Referring Phys: [id number] ALLISON WOLFE IMPRESSIONS  1. LV dyssynchrony due to LBBB. Left ventricular ejection fraction, by estimation, is 40 to 45%. The left ventricle has mildly decreased function. The left ventricle demonstrates global  hypokinesis. There is mild concentric left ventricular hypertrophy.  Left ventricular diastolic parameters are indeterminate.  2. Right ventricular systolic function is mildly reduced. The right ventricular size is normal. There is mildly elevated pulmonary artery systolic pressure. The estimated right ventricular systolic pressure is 36.5 mmHg.  3. Left atrial size was severely dilated.  4. The mitral valve is degenerative. Moderate to severe mitral valve regurgitation. No evidence of mitral stenosis.  5. Tricuspid valve regurgitation is mild to moderate.  6. Probable moderate low-gradient, low-flow AS. The non-coronary cusp is fixed but the RCC and LCC appears to open adequately. The aortic valve is tricuspid. There is moderate calcification of the aortic valve. Aortic valve regurgitation is trivial. Moderate aortic valve stenosis. Aortic valve area, by VTI measures 1.38 cm. Aortic valve mean gradient measures 5.7 mmHg. Aortic valve Vmax measures 1.58 m/s.  7. The inferior vena cava is normal in size with <50% respiratory variability, suggesting right atrial pressure of 8 mmHg. FINDINGS  Left Ventricle: LV dyssynchrony due to LBBB. Left ventricular ejection fraction, by estimation, is 40 to 45%. The left ventricle has mildly decreased function. The left ventricle demonstrates global hypokinesis. The left ventricular internal cavity size  was normal in size. There is mild concentric left ventricular hypertrophy. Abnormal (paradoxical) septal motion, consistent with left bundle branch block. Left ventricular diastolic parameters are indeterminate. Right Ventricle: The right ventricular size is normal. No increase in right ventricular wall thickness. Right ventricular systolic function is mildly reduced. There is mildly elevated pulmonary artery systolic pressure. The tricuspid regurgitant velocity  is 2.67 m/s, and with an assumed right atrial pressure of 8 mmHg, the estimated right ventricular systolic pressure is  36.5  mmHg. Left Atrium: Left atrial size was severely dilated. Right Atrium: Right atrial size was normal in size. Pericardium: There is no evidence of pericardial effusion. Mitral Valve: The mitral valve is degenerative in appearance. Moderate to severe mitral valve regurgitation. No evidence of mitral valve stenosis. MV peak gradient, 4.5 mmHg. The mean mitral valve gradient is 1.0 mmHg. Tricuspid Valve: The tricuspid valve is normal in structure. Tricuspid valve regurgitation is mild to moderate. No evidence of tricuspid stenosis. Aortic Valve: Probable moderate low-gradient, low-flow AS. The non-coronary cusp is fixed but the RCC and LCC appears to open adequately. The aortic valve is tricuspid. There is moderate calcification of the aortic valve. Aortic valve regurgitation is trivial. Moderate aortic stenosis is present. Aortic valve mean gradient measures 5.7 mmHg. Aortic valve peak gradient measures 9.9 mmHg. Aortic valve area, by VTI measures 1.38 cm. Pulmonic Valve: The pulmonic valve was normal in structure. Pulmonic valve regurgitation is not visualized. No evidence of pulmonic stenosis. Aorta: The aortic root is normal in size and structure. Venous: The inferior vena cava is normal in size with less than 50% respiratory variability, suggesting right atrial pressure of 8 mmHg. IAS/Shunts: No atrial level shunt detected by color flow Doppler.  LEFT VENTRICLE PLAX 2D LVIDd:         4.40 cm      Diastology LVIDs:         3.20 cm      LV e' medial:    4.03 cm/s LV PW:         0.70 cm      LV E/e' medial:  25.8 LV IVS:        0.80 cm      LV e' lateral:   7.01 cm/s LVOT diam:     1.90 cm      LV E/e' lateral: 14.8 LV SV:         33 LV SV Index:   22 LVOT Area:     2.84 cm  LV Volumes (MOD) LV vol d, MOD A2C: 125.0 ml LV vol d, MOD A4C: 144.0 ml LV vol s, MOD A2C: 46.3 ml LV vol s, MOD A4C: 68.2 ml LV SV MOD A2C:     78.7 ml LV SV MOD A4C:     144.0 ml LV SV MOD BP:      79.6 ml RIGHT VENTRICLE              IVC RV Basal diam:  3.50 cm     IVC diam: 1.10 cm RV Mid diam:    2.30 cm RV S prime:     10.90 cm/s TAPSE (M-mode): 1.8 cm LEFT ATRIUM              Index        RIGHT ATRIUM           Index LA diam:        3.70 cm  2.41 cm/m   RA Area:     14.70 cm LA Vol (A2C):   120.0 ml 78.03 ml/m  RA Volume:   30.00 ml  19.51 ml/m LA Vol (A4C):   62.7 ml  40.77 ml/m LA Biplane Vol: 91.4 ml  59.43 ml/m  AORTIC VALVE                     PULMONIC VALVE AV Area (Vmax):    1.25 cm      PV Vmax:       0.80  m/s AV Area (Vmean):   1.21 cm      PV Peak grad:  2.6 mmHg AV Area (VTI):     1.38 cm AV Vmax:           157.67 cm/s AV Vmean:          107.833 cm/s AV VTI:            0.242 m AV Peak Grad:      9.9 mmHg AV Mean Grad:      5.7 mmHg LVOT Vmax:         69.70 cm/s LVOT Vmean:        45.900 cm/s LVOT VTI:          0.118 m LVOT/AV VTI ratio: 0.49  AORTA Ao Root diam: 3.00 cm Ao Asc diam:  3.30 cm MITRAL VALVE                TRICUSPID VALVE MV Area (PHT): 6.83 cm     TR Peak grad:   28.5 mmHg MV Area VTI:   1.33 cm     TR Vmax:        267.00 cm/s MV Peak grad:  4.5 mmHg MV Mean grad:  1.0 mmHg     SHUNTS MV Vmax:       1.06 m/s     Systemic VTI:  0.12 m MV Vmean:      51.7 cm/s    Systemic Diam: 1.90 cm MV Decel Time: 111 msec MV E velocity: 104.00 cm/s Arvilla Meres MD Electronically signed by Arvilla Meres MD Signature Date/Time: 02/18/2024/11:36:27 AM    Final    Scheduled Meds:  apixaban  2.5 mg Oral BID   cephALEXin  500 mg Oral Q12H   digoxin  125 mcg Oral Daily   famotidine  20 mg Oral QHS   levothyroxine  50 mcg Oral QAC breakfast   metoprolol succinate  50 mg Oral Daily   PARoxetine  20 mg Oral q AM   potassium chloride  40 mEq Oral Once   QUEtiapine  50 mg Oral QHS   Continuous Infusions:     LOS: 0 days    Time spent: 35 Mins    Willeen Niece, MD [facility]   If 7PM-7AM, please contact

## 2024-02-20 ENCOUNTER — Ambulatory Visit: Admitting: Cardiology

## 2024-02-20 ENCOUNTER — Other Ambulatory Visit (HOSPITAL_COMMUNITY): Payer: Self-pay

## 2024-02-20 DIAGNOSIS — I480 Paroxysmal atrial fibrillation: Secondary | ICD-10-CM | POA: Diagnosis not present

## 2024-02-20 DIAGNOSIS — I5021 Acute systolic (congestive) heart failure: Secondary | ICD-10-CM | POA: Diagnosis not present

## 2024-02-20 DIAGNOSIS — R7989 Other specified abnormal findings of blood chemistry: Secondary | ICD-10-CM | POA: Diagnosis not present

## 2024-02-20 DIAGNOSIS — N179 Acute kidney failure, unspecified: Secondary | ICD-10-CM

## 2024-02-20 DIAGNOSIS — E039 Hypothyroidism, unspecified: Secondary | ICD-10-CM

## 2024-02-20 DIAGNOSIS — I4891 Unspecified atrial fibrillation: Secondary | ICD-10-CM | POA: Diagnosis not present

## 2024-02-20 LAB — BASIC METABOLIC PANEL
Anion gap: 13 (ref 5–15)
BUN: 21 mg/dL (ref 8–23)
CO2: 23 mmol/L (ref 22–32)
Calcium: 9 mg/dL (ref 8.9–10.3)
Chloride: 103 mmol/L (ref 98–111)
Creatinine, Ser: 1.12 mg/dL — ABNORMAL HIGH (ref 0.44–1.00)
GFR, Estimated: 47 mL/min — ABNORMAL LOW (ref >60)
Glucose, Bld: 86 mg/dL (ref 70–99)
Potassium: 3.8 mmol/L (ref 3.5–5.1)
Sodium: 139 mmol/L (ref 135–145)

## 2024-02-20 MED ORDER — METOPROLOL SUCCINATE ER 50 MG PO TB24
50.0000 mg | ORAL_TABLET | Freq: Every day | ORAL | 1 refills | Status: DC
  Filled 2024-02-20: qty 30, 30d supply, fill #0

## 2024-02-20 NOTE — Plan of Care (Signed)

## 2024-02-20 NOTE — Progress Notes (Addendum)
 PROGRESS NOTE    Suhailah Wagner  ZOX:[id number] DOB: 03-31-1933 DOA: 02/17/2024 PCP: [name], Elige Radon, MD   Brief Narrative:  This 89 y.o. female with medical history significant of paroxysmal Atrial fibrillation, anxiety and depression, HLD, hx. of CABG,  hx of CVA, dementia, hypothyroidism, LBBB who presented to ED with complaints of light headedness with exertion and atrial fib with RVR. Daughter states that patient lost her sister about 3 weeks ago and then her best friend lost her daughter, since Friday she has been going into A-fib with RVR and becoming symptomatic with these episodes.  She was seen in the ED on Friday and was found to have a UTI.  Her digoxin dose was increased to daily from every other day and she was sent home.  She continued to have symptomatic episodes with exertion which prompted her to come to the ED today. ED workup : CT chest shows small right larger than left pleural effusion.  Pulmonary artery hypertension.  Patient was started on Cardizem gtt. and she was admitted for further evaluation. Cardiology consulted.   Assessment & Plan:   A-fib with RVR: She presented with recurrent episodes of atrial fibrillation with RVR while exertion. She was initiated on Cardizem gtt and heart rate better controlled.  Echocardiogram shows LVEF 40 to 45%, LV dyssynchrony due to LBBB. Digoxin level within normal limits. Cardizem discontinued given reduced LVEF. Cardiology is following. Patient noted to be on digoxin, metoprolol, Eliquis. Heart rate is reasonably well-controlled.  Acute on chronic systolic and diastolic CHF (congestive heart failure) (HCC) CT chest with bilateral small pleural effusion and cardiomegaly.  BNP 896 ,  Effusions likely from recurrent atrial fib with RVR Patient underwent echocardiogram which showed LVEF of 40 to 45%.  Concentric LVH was noted.  Aortic stenosis was noted. Patient was given IV furosemide.  Holding further diuresis due to  rising creatinine. Creatinine noted to be better today.  Await further cardiology input.   Normocytic anemia No overt bleeding and denies bleeding    Acquired hypothyroidism: Continue synthroid daily    CAD (coronary artery disease) Hx of CABG Continue medical management .   Adjustment disorder with mixed anxiety and depressed mood Continue paxil .   Essential hypertension Continue Toprol XL 50 mg daily.  Recent UTI Looks like she was started on cephalexin for a urinary tract infection in the outpatient setting.  This is being continued here in the hospital.   DVT prophylaxis: Eliquis Code Status: (DNR Family Communication: No family at bedside Disposition Plan: Plan is for home with home health.    Consultants:  Cardiology  Procedures: Echocardiogram  Antimicrobials: Anti-infectives (From admission, onward)    Start     Dose/Rate Route Frequency Ordered Stop   02/18/24 2200  cephALEXin (KEFLEX) capsule 500 mg        500 mg Oral Every 12 hours 02/18/24 1052     02/17/24 1800  cephALEXin (KEFLEX) capsule 500 mg  Status:  Discontinued        500 mg Oral 4 times daily 02/17/24 1743 02/18/24 1052      Subjective: Hard of hearing.  Pleasantly confused.  Denies any complaints this morning.   Objective: Vitals:   02/19/24 0250 02/19/24 0500 02/19/24 0833 02/19/24 1348  BP: 120/81   120/88  Pulse: (!) 38  69 75  Resp: 20  19   Temp: (!) 97.3 F (36.3 C)  97.6 F (36.4 C) 97.6 F (36.4 C)  TempSrc: Oral  Oral Oral  SpO2: 90%     Weight:  49.4 kg    Height:        Intake/Output Summary (Last 24 hours) at 02/19/2024 1432 Last data filed at 02/19/2024 0636 Gross per 24 hour  Intake --  Output 1770 ml  Net -1770 ml   Filed Weights   02/18/24 0456 02/19/24 0500 02/20/24 0454  Weight: 51.5 kg 49.4 kg 45.4 kg    Examination:  General appearance: Awake alert.  In no distress Resp: Clear to auscultation bilaterally.  Normal effort Cardio: S1-S2 is  normal regular.  No S3-S4.  No rubs murmurs or bruit GI: Abdomen is soft.  Nontender nondistended.  Bowel sounds are present normal.  No masses organomegaly    Data Reviewed: I have personally reviewed following labs and imaging studies  CBC: Recent Labs  Lab 02/15/24 1306 02/17/24 1129 02/18/24 0519  WBC 6.7 8.2 6.2  HGB 11.6* 10.7* 10.1*  HCT 36.1 33.8* 30.9*  MCV 92.6 94.7 92.2  PLT 246 236 209   Basic Metabolic Panel: Recent Labs  Lab 02/15/24 1306 02/15/24 1402 02/17/24 1129 02/18/24 0519 02/19/24 0422 02/20/24 0505  NA 140  --  140 137 138 139  K 4.2  --  4.3 3.5 3.7 3.8  CL 105  --  106 107 105 103  CO2 26  --  24 23 25 23   GLUCOSE 104*  --  106* 89 95 86  BUN 23  --  21 20 21 21   CREATININE 0.90  --  0.93 1.15* 1.36* 1.12*  CALCIUM 9.8  --  8.9 8.2* 8.5* 9.0  MG  --  2.0 1.7 1.7 1.7  --    GFR: Estimated Creatinine Clearance: 22.3 mL/min (A) (by C-G formula based on SCr of 1.36 mg/dL (H)). Liver Function Tests: Recent Labs  Lab 02/15/24 1402  AST 18  ALT 8  ALKPHOS 107  BILITOT 1.3*  PROT 7.0  ALBUMIN 4.0   Recent Labs  Lab 02/15/24 1402  LIPASE 25    CBG: Recent Labs  Lab 02/15/24 1313  GLUCAP 86   Anemia Panel: Recent Labs    02/17/24 1553  VITAMINB12 1,691*  FERRITIN 54  TIBC 274  IRON 42   Radiology Studies: ECHOCARDIOGRAM COMPLETE Result Date: 02/18/2024    ECHOCARDIOGRAM REPORT   Patient Name:   Emily Wagner Date of Exam: 02/18/2024 Medical Rec #:  [id number]         Height:       64.0 in Accession #:    [id number]        Weight:       113.5 lb Date of Birth:  09/24/1933         BSA:          1.538 m Patient Age:    89 years          BP:           134/76 mmHg Patient Gender: F                 HR:           65 bpm. Exam Location:  Inpatient Procedure: 2D Echo, Cardiac Doppler and Color Doppler (Both Spectral and Color            Flow Doppler were utilized during procedure). Indications:    Atrial fibrillation  History:         Patient has prior history of Echocardiogram examinations, most  recent 01/26/2016. CHF, CAD, Stroke, Arrythmias:LBBB; Risk                 Factors:Hypertension.  Sonographer:    Vern Claude Referring Phys: [id number] ALLISON WOLFE IMPRESSIONS  1. LV dyssynchrony due to LBBB. Left ventricular ejection fraction, by estimation, is 40 to 45%. The left ventricle has mildly decreased function. The left ventricle demonstrates global hypokinesis. There is mild concentric left ventricular hypertrophy.  Left ventricular diastolic parameters are indeterminate.  2. Right ventricular systolic function is mildly reduced. The right ventricular size is normal. There is mildly elevated pulmonary artery systolic pressure. The estimated right ventricular systolic pressure is 36.5 mmHg.  3. Left atrial size was severely dilated.  4. The mitral valve is degenerative. Moderate to severe mitral valve regurgitation. No evidence of mitral stenosis.  5. Tricuspid valve regurgitation is mild to moderate.  6. Probable moderate low-gradient, low-flow AS. The non-coronary cusp is fixed but the RCC and LCC appears to open adequately. The aortic valve is tricuspid. There is moderate calcification of the aortic valve. Aortic valve regurgitation is trivial. Moderate aortic valve stenosis. Aortic valve area, by VTI measures 1.38 cm. Aortic valve mean gradient measures 5.7 mmHg. Aortic valve Vmax measures 1.58 m/s.  7. The inferior vena cava is normal in size with <50% respiratory variability, suggesting right atrial pressure of 8 mmHg. FINDINGS  Left Ventricle: LV dyssynchrony due to LBBB. Left ventricular ejection fraction, by estimation, is 40 to 45%. The left ventricle has mildly decreased function. The left ventricle demonstrates global hypokinesis. The left ventricular internal cavity size  was normal in size. There is mild concentric left ventricular hypertrophy. Abnormal (paradoxical) septal motion, consistent with left bundle  branch block. Left ventricular diastolic parameters are indeterminate. Right Ventricle: The right ventricular size is normal. No increase in right ventricular wall thickness. Right ventricular systolic function is mildly reduced. There is mildly elevated pulmonary artery systolic pressure. The tricuspid regurgitant velocity  is 2.67 m/s, and with an assumed right atrial pressure of 8 mmHg, the estimated right ventricular systolic pressure is 36.5 mmHg. Left Atrium: Left atrial size was severely dilated. Right Atrium: Right atrial size was normal in size. Pericardium: There is no evidence of pericardial effusion. Mitral Valve: The mitral valve is degenerative in appearance. Moderate to severe mitral valve regurgitation. No evidence of mitral valve stenosis. MV peak gradient, 4.5 mmHg. The mean mitral valve gradient is 1.0 mmHg. Tricuspid Valve: The tricuspid valve is normal in structure. Tricuspid valve regurgitation is mild to moderate. No evidence of tricuspid stenosis. Aortic Valve: Probable moderate low-gradient, low-flow AS. The non-coronary cusp is fixed but the RCC and LCC appears to open adequately. The aortic valve is tricuspid. There is moderate calcification of the aortic valve. Aortic valve regurgitation is trivial. Moderate aortic stenosis is present. Aortic valve mean gradient measures 5.7 mmHg. Aortic valve peak gradient measures 9.9 mmHg. Aortic valve area, by VTI measures 1.38 cm. Pulmonic Valve: The pulmonic valve was normal in structure. Pulmonic valve regurgitation is not visualized. No evidence of pulmonic stenosis. Aorta: The aortic root is normal in size and structure. Venous: The inferior vena cava is normal in size with less than 50% respiratory variability, suggesting right atrial pressure of 8 mmHg. IAS/Shunts: No atrial level shunt detected by color flow Doppler.  LEFT VENTRICLE PLAX 2D LVIDd:         4.40 cm      Diastology LVIDs:         3.20 cm  LV e' medial:    4.03 cm/s LV PW:          0.70 cm      LV E/e' medial:  25.8 LV IVS:        0.80 cm      LV e' lateral:   7.01 cm/s LVOT diam:     1.90 cm      LV E/e' lateral: 14.8 LV SV:         33 LV SV Index:   22 LVOT Area:     2.84 cm  LV Volumes (MOD) LV vol d, MOD A2C: 125.0 ml LV vol d, MOD A4C: 144.0 ml LV vol s, MOD A2C: 46.3 ml LV vol s, MOD A4C: 68.2 ml LV SV MOD A2C:     78.7 ml LV SV MOD A4C:     144.0 ml LV SV MOD BP:      79.6 ml RIGHT VENTRICLE             IVC RV Basal diam:  3.50 cm     IVC diam: 1.10 cm RV Mid diam:    2.30 cm RV S prime:     10.90 cm/s TAPSE (M-mode): 1.8 cm LEFT ATRIUM              Index        RIGHT ATRIUM           Index LA diam:        3.70 cm  2.41 cm/m   RA Area:     14.70 cm LA Vol (A2C):   120.0 ml 78.03 ml/m  RA Volume:   30.00 ml  19.51 ml/m LA Vol (A4C):   62.7 ml  40.77 ml/m LA Biplane Vol: 91.4 ml  59.43 ml/m  AORTIC VALVE                     PULMONIC VALVE AV Area (Vmax):    1.25 cm      PV Vmax:       0.80 m/s AV Area (Vmean):   1.21 cm      PV Peak grad:  2.6 mmHg AV Area (VTI):     1.38 cm AV Vmax:           157.67 cm/s AV Vmean:          107.833 cm/s AV VTI:            0.242 m AV Peak Grad:      9.9 mmHg AV Mean Grad:      5.7 mmHg LVOT Vmax:         69.70 cm/s LVOT Vmean:        45.900 cm/s LVOT VTI:          0.118 m LVOT/AV VTI ratio: 0.49  AORTA Ao Root diam: 3.00 cm Ao Asc diam:  3.30 cm MITRAL VALVE                TRICUSPID VALVE MV Area (PHT): 6.83 cm     TR Peak grad:   28.5 mmHg MV Area VTI:   1.33 cm     TR Vmax:        267.00 cm/s MV Peak grad:  4.5 mmHg MV Mean grad:  1.0 mmHg     SHUNTS MV Vmax:       1.06 m/s     Systemic VTI:  0.12 m MV Vmean:      51.7 cm/s  Systemic Diam: 1.90 cm MV Decel Time: 111 msec MV E velocity: 104.00 cm/s Arvilla Meres MD Electronically signed by Arvilla Meres MD Signature Date/Time: 02/18/2024/11:36:27 AM    Final    Scheduled Meds:  apixaban  2.5 mg Oral BID   cephALEXin  500 mg Oral Q12H   digoxin  125 mcg Oral Daily   famotidine   20 mg Oral QHS   levothyroxine  50 mcg Oral QAC breakfast   metoprolol succinate  50 mg Oral Daily   PARoxetine  20 mg Oral q AM   potassium chloride  40 mEq Oral Once   QUEtiapine  50 mg Oral QHS   Continuous Infusions:     LOS: 0 days    Osvaldo Shipper, MD [facility]   If 7PM-7AM, please contact

## 2024-02-20 NOTE — Plan of Care (Signed)
 Patient ID: Emily Wagner, female   DOB: 11/26/1933, 89 y.o.   MRN: [id number]  Problem: Education: Goal: Knowledge of General Education information will improve Description: Including pain rating scale, medication(s)/side effects and non-pharmacologic comfort measures Outcome: Adequate for Discharge   Problem: Health Behavior/Discharge Planning: Goal: Ability to manage health-related needs will improve Outcome: Adequate for Discharge   Problem: Clinical Measurements: Goal: Ability to maintain clinical measurements within normal limits will improve Outcome: Adequate for Discharge Goal: Will remain free from infection Outcome: Adequate for Discharge Goal: Diagnostic test results will improve Outcome: Adequate for Discharge Goal: Respiratory complications will improve Outcome: Adequate for Discharge Goal: Cardiovascular complication will be avoided Outcome: Adequate for Discharge   Problem: Activity: Goal: Risk for activity intolerance will decrease Outcome: Adequate for Discharge   Problem: Nutrition: Goal: Adequate nutrition will be maintained Outcome: Adequate for Discharge   Problem: Coping: Goal: Level of anxiety will decrease Outcome: Adequate for Discharge   Problem: Elimination: Goal: Will not experience complications related to bowel motility Outcome: Adequate for Discharge Goal: Will not experience complications related to urinary retention Outcome: Adequate for Discharge   Problem: Pain Managment: Goal: General experience of comfort will improve and/or be controlled Outcome: Adequate for Discharge   Problem: Safety: Goal: Ability to remain free from injury will improve Outcome: Adequate for Discharge   Problem: Skin Integrity: Goal: Risk for impaired skin integrity will decrease Outcome: Adequate for Discharge   Problem: Education: Goal: Knowledge of disease or condition will improve Outcome: Adequate for Discharge Goal: Understanding of medication  regimen will improve Outcome: Adequate for Discharge Goal: Individualized Educational Video(s) Outcome: Adequate for Discharge   Problem: Activity: Goal: Ability to tolerate increased activity will improve Outcome: Adequate for Discharge   Problem: Cardiac: Goal: Ability to achieve and maintain adequate cardiopulmonary perfusion will improve Outcome: Adequate for Discharge   Problem: Health Behavior/Discharge Planning: Goal: Ability to safely manage health-related needs after discharge will improve Outcome: Adequate for Discharge   Problem: Acute Rehab PT Goals(only PT should resolve) Goal: Pt Will Go Supine/Side To Sit Outcome: Adequate for Discharge Goal: Patient Will Perform Sitting Balance Outcome: Adequate for Discharge Goal: Patient Will Transfer Sit To/From Stand Outcome: Adequate for Discharge Goal: Pt Will Transfer Bed To Chair/Chair To Bed Outcome: Adequate for Discharge Goal: Pt Will Ambulate Outcome: Adequate for Discharge     Lidia Collum, RN

## 2024-02-20 NOTE — TOC Transition Note (Signed)
 Transition of Care [facility]) - Discharge Note   Patient Details  Name: Emily Wagner MRN: [id number] Date of Birth: 1933-02-16  Transition of [facility]) CM/SW Contact:  Gala Lewandowsky, RN Phone Number: 02/20/2024, 1:56 PM   Clinical Narrative: Plan is for patient to transition home today. Daughter is agreeable to outpatient Palliative Services- and wants [facility]. Referral submitted to [facility] and the office will review clinicals and will contact the patient within 2 weeks. No further needs identified at this time.      Final next level of care: Home w Home Health Services Barriers to Discharge: No Barriers Identified   Patient Goals and CMS Choice Patient states their goals for this hospitalization and ongoing recovery are:: plan home once   Choice offered to / list presented to : NA (Already active with [facility])   Discharge Plan and Services Additional resources added to the After Visit Summary for     Discharge Planning Services: CM Consult Post Acute Care Choice: Home Health, Resumption of Svcs/PTA Provider           HH Arranged: PT, Nurse's Aide, Social Work [employer] Agency: [facility] Date [facility] Agency Contacted: 02/18/24 Time HH Agency Contacted: 1536 Representative spoke with at [facility] Agency: [place] [facility]  Social Drivers of Health (SDOH) Interventions SDOH Screenings   Food Insecurity: No Food Insecurity (02/19/2024)  Housing: Low Risk  (02/19/2024)  Transportation Needs: No Transportation Needs (02/19/2024)  Utilities: Not At Risk (02/19/2024)  Alcohol Screen: Low Risk  (01/15/2024)  Depression (PHQ2-9): Low Risk  (01/28/2024)  Recent Concern: Depression (PHQ2-9) - Medium Risk (01/15/2024)  Financial Resource Strain: Low Risk  (01/15/2024)  Physical Activity: Inactive (01/15/2024)  Social Connections: Moderately Integrated (02/20/2024)  Stress: No Stress Concern Present (01/15/2024)  Tobacco Use: Low Risk  (02/17/2024)  Health  Literacy: Adequate Health Literacy (01/15/2024)    Readmission Risk Interventions     No data to display

## 2024-02-20 NOTE — Evaluation (Signed)
 Physical Therapy Evaluation Patient Details Name: Emily Wagner MRN: [id number] DOB: 1933-10-22 Today's Date: 02/20/2024  History of Present Illness  This 89 y.o. female admitted 3/23 with afib with RVR.  Recent UTI.  Also recently lost her sister. PMH: paroxysmal Atrial fibrillation, anxiety and depression, HLD, hx. of CABG,  hx of CVA, dementia, hypothyroidism, LBBB who  Clinical Impression  Pt admitted with above diagnosis. Pt was able to ambulate with RW in room with CGA to min assist. Pt has caregivers at home and daughter lives with her. Pt will need continued 24 hour care, HHPT and equipment as below if she doesn't already have the listed equipment. Pt slightly confused and couldn't answer all questions. Will follow acutely.  Notified MD of HR response to exercise. Pt currently with functional limitations due to the deficits listed below (see PT Problem List). Pt will benefit from acute skilled PT to increase their independence and safety with mobility to allow discharge.           If plan is discharge home, recommend the following: A little help with walking and/or transfers;A little help with bathing/dressing/bathroom;Assistance with cooking/housework;Assist for transportation;Help with stairs or ramp for entrance;Supervision due to cognitive status   Can travel by private vehicle        Equipment Recommendations Rollator (4 wheels);BSC/3in1 (Pt may have a rollator but she couldnt tell this PT)  Recommendations for Other Services       Functional Status Assessment Patient has had a recent decline in their functional status and demonstrates the ability to make significant improvements in function in a reasonable and predictable amount of time.     Precautions / Restrictions Precautions Precautions: Fall Restrictions Weight Bearing Restrictions Per Provider Order: No      Mobility  Bed Mobility Overal bed mobility: Independent                  Transfers Overall  transfer level: Needs assistance Equipment used: Rolling walker (2 wheels) Transfers: Sit to/from Stand Sit to Stand: Supervision           General transfer comment: cues for hand placement    Ambulation/Gait Ambulation/Gait assistance: Min assist Gait Distance (Feet): 15 Feet (15 feet x 2) Assistive device: Rolling walker (2 wheels) Gait Pattern/deviations: Step-through pattern, Decreased stride length, Drifts right/left, Trunk flexed   Gait velocity interpretation: <1.31 ft/sec, indicative of household ambulator   General Gait Details: Pt with good safety initially with RW however as she felt her HR rise, needing CGA to min assist as she was rushing to seat to sit down.  HR 71-102 bpm with activity. Returned to 84 bpm and then pt walked back to recliner with some assist needed to get around obstacles.  Stairs            Wheelchair Mobility     Tilt Bed    Modified Rankin (Stroke Patients Only)       Balance Overall balance assessment: Needs assistance Sitting-balance support: No upper extremity supported, Feet supported Sitting balance-Leahy Scale: Fair     Standing balance support: Bilateral upper extremity supported, During functional activity Standing balance-Leahy Scale: Poor Standing balance comment: relies on UE support                             Pertinent Vitals/Pain Pain Assessment Pain Assessment: No/denies pain    Home Living Family/patient expects to be discharged to:: Private residence Living Arrangements: Children  Type of Home: House Home Access: Ramped entrance       Home Layout: One level Home Equipment: Grab bars - toilet;Standard Environmental consultant      Prior Function Prior Level of Function : Needs assist             Mobility Comments: has caregivers and daughter assists       Extremity/Trunk Assessment   Upper Extremity Assessment Upper Extremity Assessment: Defer to OT evaluation    Lower Extremity  Assessment Lower Extremity Assessment: Generalized weakness    Cervical / Trunk Assessment Cervical / Trunk Assessment: Kyphotic  Communication   Communication Communication: No apparent difficulties    Cognition Arousal: Alert Behavior During Therapy: Flat affect   PT - Cognitive impairments: No apparent impairments                                 Cueing       General Comments      Exercises     Assessment/Plan    PT Assessment Patient needs continued PT services  PT Problem List Decreased activity tolerance;Decreased balance;Decreased mobility;Decreased knowledge of use of DME;Decreased safety awareness;Decreased knowledge of precautions;Cardiopulmonary status limiting activity       PT Treatment Interventions DME instruction;Gait training;Functional mobility training;Therapeutic activities;Therapeutic exercise;Balance training;Patient/family education    PT Goals (Current goals can be found in the Care Plan section)  Acute Rehab PT Goals Patient Stated Goal: unable to state PT Goal Formulation: With patient Time For Goal Achievement: 03/05/24 Potential to Achieve Goals: Fair    Frequency Min 2X/week     Co-evaluation               AM-PAC PT "6 Clicks" Mobility  Outcome Measure Help needed turning from your back to your side while in a flat bed without using bedrails?: None Help needed moving from lying on your back to sitting on the side of a flat bed without using bedrails?: None Help needed moving to and from a bed to a chair (including a wheelchair)?: A Little Help needed standing up from a chair using your arms (e.g., wheelchair or bedside chair)?: A Little Help needed to walk in hospital room?: Total Help needed climbing 3-5 steps with a railing? : Total 6 Click Score: 16    End of Session Equipment Utilized During Treatment: Gait belt Activity Tolerance: Patient limited by fatigue Patient left: in chair;with call bell/phone  within reach;with chair alarm set Nurse Communication: Mobility status PT Visit Diagnosis: Muscle weakness (generalized) (M62.81)    Time: 0102-7253 PT Time Calculation (min) (ACUTE ONLY): 13 min   Charges:   PT Evaluation $PT Eval Low Complexity: 1 Low   PT General Charges $$ ACUTE PT VISIT: 1 Visit         Elwood Bazinet M,PT Acute Rehab Services [phone]   Bevelyn Buckles 02/20/2024, 12:38 PM

## 2024-02-20 NOTE — Progress Notes (Addendum)
 Rounding Note    Patient Name: Emily Wagner Date of Encounter: 02/20/2024  [facility] Cardiologist: [name], MD   Subjective   No acute overnight events. She has no complaints this morning. No chest pain, shortness of breath, palpitations. She does report she was previously having significant dyspnea on exertion before coming in but she has not been up ambulating much since being here.  Inpatient Medications    Scheduled Meds:  apixaban  2.5 mg Oral BID   cephALEXin  500 mg Oral Q12H   digoxin  125 mcg Oral Daily   famotidine  20 mg Oral QHS   levothyroxine  50 mcg Oral QAC breakfast   metoprolol succinate  50 mg Oral Daily   PARoxetine  20 mg Oral q AM   potassium chloride  40 mEq Oral Once   QUEtiapine  50 mg Oral QHS   Continuous Infusions:  PRN Meds: acetaminophen **OR** acetaminophen, mouth rinse   Vital Signs    Vitals:   02/19/24 1800 02/19/24 1900 02/19/24 2122 02/20/24 0454  BP:   (!) 145/92 (!) 138/91  Pulse:   71 72  Resp: (!) 21 18 (!) 22 16  Temp:   98 F (36.7 C) 97.6 F (36.4 C)  TempSrc:   Oral Oral  SpO2:   92% 92%  Weight:    45.4 kg  Height:       No intake or output data in the 24 hours ending 02/20/24 0718    02/20/2024    4:54 AM 02/19/2024    5:00 AM 02/18/2024    4:56 AM  Last 3 Weights  Weight (lbs) 100 lb 109 lb 113 lb 8.6 oz  Weight (kg) 45.36 kg 49.442 kg 51.5 kg      Telemetry    Atrial fibrillation with rates in the 60s to 70s. - Personally Reviewed  ECG    No new ECG tracing today. - Personally Reviewed  Physical Exam   GEN: Thin frail 89 year old Caucasian female. No acute distress.   Neck: No JVD. Cardiac: Irregularly irregular rhythm with normal rate. No murmurs, rubs, or gallops.  Respiratory: Clear to auscultation bilaterally. No wheezes, rhonchi, or rales. MS: No lower extremity edema. No deformity. Neuro:  No focal deficits. Psych: Does not always answer questions  appropriately.  Labs    High Sensitivity Troponin:   Recent Labs  Lab 02/15/24 1402 02/15/24 1621 02/17/24 1129 02/17/24 1400  TROPONINIHS 39* 42* 41* 44*     Chemistry Recent Labs  Lab 02/15/24 1402 02/17/24 1129 02/18/24 0519 02/19/24 0422 02/20/24 0505  NA  --  140 137 138 139  K  --  4.3 3.5 3.7 3.8  CL  --  106 107 105 103  CO2  --  24 23 25 23   GLUCOSE  --  106* 89 95 86  BUN  --  21 20 21 21   CREATININE  --  0.93 1.15* 1.36* 1.12*  CALCIUM  --  8.9 8.2* 8.5* 9.0  MG 2.0 1.7 1.7 1.7  --   PROT 7.0  --   --   --   --   ALBUMIN 4.0  --   --   --   --   AST 18  --   --   --   --   ALT 8  --   --   --   --   ALKPHOS 107  --   --   --   --  BILITOT 1.3*  --   --   --   --   GFRNONAA  --  59* 46* 37* 47*  ANIONGAP  --  10 7 8 13     Lipids No results for input(s): "CHOL", "TRIG", "HDL", "LABVLDL", "LDLCALC", "CHOLHDL" in the last 168 hours.  Hematology Recent Labs  Lab 02/15/24 1306 02/17/24 1129 02/18/24 0519  WBC 6.7 8.2 6.2  RBC 3.90 3.57* 3.35*  HGB 11.6* 10.7* 10.1*  HCT 36.1 33.8* 30.9*  MCV 92.6 94.7 92.2  MCH 29.7 30.0 30.1  MCHC 32.1 31.7 32.7  RDW 18.5* 18.7* 18.7*  PLT 246 236 209   Thyroid  Recent Labs  Lab 02/15/24 1437  TSH 1.861    BNP Recent Labs  Lab 02/17/24 1630  BNP 896.7*    DDimer No results for input(s): "DDIMER" in the last 168 hours.   Radiology    ECHOCARDIOGRAM COMPLETE Result Date: 02/18/2024    ECHOCARDIOGRAM REPORT   Patient Name:   Emily Wagner Date of Exam: 02/18/2024 Medical Rec #:  [id number]         Height:       64.0 in Accession #:    [id number]        Weight:       113.5 lb Date of Birth:  [birth year]         BSA:          1.538 m Patient Age:    89 years          BP:           134/76 mmHg Patient Gender: F                 HR:           65 bpm. Exam Location:  Inpatient Procedure: 2D Echo, Cardiac Doppler and Color Doppler (Both Spectral and Color            Flow Doppler were utilized during procedure).  Indications:    Atrial fibrillation  History:        Patient has prior history of Echocardiogram examinations, most                 recent 01/26/2016. CHF, CAD, Stroke, Arrythmias:LBBB; Risk                 Factors:Hypertension.  Sonographer:    Vern Claude Referring Phys: [id number] ALLISON WOLFE IMPRESSIONS  1. LV dyssynchrony due to LBBB. Left ventricular ejection fraction, by estimation, is 40 to 45%. The left ventricle has mildly decreased function. The left ventricle demonstrates global hypokinesis. There is mild concentric left ventricular hypertrophy.  Left ventricular diastolic parameters are indeterminate.  2. Right ventricular systolic function is mildly reduced. The right ventricular size is normal. There is mildly elevated pulmonary artery systolic pressure. The estimated right ventricular systolic pressure is 36.5 mmHg.  3. Left atrial size was severely dilated.  4. The mitral valve is degenerative. Moderate to severe mitral valve regurgitation. No evidence of mitral stenosis.  5. Tricuspid valve regurgitation is mild to moderate.  6. Probable moderate low-gradient, low-flow AS. The non-coronary cusp is fixed but the RCC and LCC appears to open adequately. The aortic valve is tricuspid. There is moderate calcification of the aortic valve. Aortic valve regurgitation is trivial. Moderate aortic valve stenosis. Aortic valve area, by VTI measures 1.38 cm. Aortic valve mean gradient measures 5.7 mmHg. Aortic valve Vmax measures 1.58 m/s.  7. The inferior vena cava is  normal in size with <50% respiratory variability, suggesting right atrial pressure of 8 mmHg. FINDINGS  Left Ventricle: LV dyssynchrony due to LBBB. Left ventricular ejection fraction, by estimation, is 40 to 45%. The left ventricle has mildly decreased function. The left ventricle demonstrates global hypokinesis. The left ventricular internal cavity size  was normal in size. There is mild concentric left ventricular hypertrophy. Abnormal  (paradoxical) septal motion, consistent with left bundle branch block. Left ventricular diastolic parameters are indeterminate. Right Ventricle: The right ventricular size is normal. No increase in right ventricular wall thickness. Right ventricular systolic function is mildly reduced. There is mildly elevated pulmonary artery systolic pressure. The tricuspid regurgitant velocity  is 2.67 m/s, and with an assumed right atrial pressure of 8 mmHg, the estimated right ventricular systolic pressure is 36.5 mmHg. Left Atrium: Left atrial size was severely dilated. Right Atrium: Right atrial size was normal in size. Pericardium: There is no evidence of pericardial effusion. Mitral Valve: The mitral valve is degenerative in appearance. Moderate to severe mitral valve regurgitation. No evidence of mitral valve stenosis. MV peak gradient, 4.5 mmHg. The mean mitral valve gradient is 1.0 mmHg. Tricuspid Valve: The tricuspid valve is normal in structure. Tricuspid valve regurgitation is mild to moderate. No evidence of tricuspid stenosis. Aortic Valve: Probable moderate low-gradient, low-flow AS. The non-coronary cusp is fixed but the RCC and LCC appears to open adequately. The aortic valve is tricuspid. There is moderate calcification of the aortic valve. Aortic valve regurgitation is trivial. Moderate aortic stenosis is present. Aortic valve mean gradient measures 5.7 mmHg. Aortic valve peak gradient measures 9.9 mmHg. Aortic valve area, by VTI measures 1.38 cm. Pulmonic Valve: The pulmonic valve was normal in structure. Pulmonic valve regurgitation is not visualized. No evidence of pulmonic stenosis. Aorta: The aortic root is normal in size and structure. Venous: The inferior vena cava is normal in size with less than 50% respiratory variability, suggesting right atrial pressure of 8 mmHg. IAS/Shunts: No atrial level shunt detected by color flow Doppler.  LEFT VENTRICLE PLAX 2D LVIDd:         4.40 cm      Diastology LVIDs:          3.20 cm      LV e' medial:    4.03 cm/s LV PW:         0.70 cm      LV E/e' medial:  25.8 LV IVS:        0.80 cm      LV e' lateral:   7.01 cm/s LVOT diam:     1.90 cm      LV E/e' lateral: 14.8 LV SV:         33 LV SV Index:   22 LVOT Area:     2.84 cm  LV Volumes (MOD) LV vol d, MOD A2C: 125.0 ml LV vol d, MOD A4C: 144.0 ml LV vol s, MOD A2C: 46.3 ml LV vol s, MOD A4C: 68.2 ml LV SV MOD A2C:     78.7 ml LV SV MOD A4C:     144.0 ml LV SV MOD BP:      79.6 ml RIGHT VENTRICLE             IVC RV Basal diam:  3.50 cm     IVC diam: 1.10 cm RV Mid diam:    2.30 cm RV S prime:     10.90 cm/s TAPSE (M-mode): 1.8 cm LEFT ATRIUM  Index        RIGHT ATRIUM           Index LA diam:        3.70 cm  2.41 cm/m   RA Area:     14.70 cm LA Vol (A2C):   120.0 ml 78.03 ml/m  RA Volume:   30.00 ml  19.51 ml/m LA Vol (A4C):   62.7 ml  40.77 ml/m LA Biplane Vol: 91.4 ml  59.43 ml/m  AORTIC VALVE                     PULMONIC VALVE AV Area (Vmax):    1.25 cm      PV Vmax:       0.80 m/s AV Area (Vmean):   1.21 cm      PV Peak grad:  2.6 mmHg AV Area (VTI):     1.38 cm AV Vmax:           157.67 cm/s AV Vmean:          107.833 cm/s AV VTI:            0.242 m AV Peak Grad:      9.9 mmHg AV Mean Grad:      5.7 mmHg LVOT Vmax:         69.70 cm/s LVOT Vmean:        45.900 cm/s LVOT VTI:          0.118 m LVOT/AV VTI ratio: 0.49  AORTA Ao Root diam: 3.00 cm Ao Asc diam:  3.30 cm MITRAL VALVE                TRICUSPID VALVE MV Area (PHT): 6.83 cm     TR Peak grad:   28.5 mmHg MV Area VTI:   1.33 cm     TR Vmax:        267.00 cm/s MV Peak grad:  4.5 mmHg MV Mean grad:  1.0 mmHg     SHUNTS MV Vmax:       1.06 m/s     Systemic VTI:  0.12 m MV Vmean:      51.7 cm/s    Systemic Diam: 1.90 cm MV Decel Time: 111 msec MV E velocity: 104.00 cm/s Arvilla Meres MD Electronically signed by Arvilla Meres MD Signature Date/Time: 02/18/2024/11:36:27 AM    Final     Cardiac Studies   Echocardiogram 02/18/2024: Impressions:   1. LV dyssynchrony due to LBBB. Left ventricular ejection fraction, by  estimation, is 40 to 45%. The left ventricle has mildly decreased  function. The left ventricle demonstrates global hypokinesis. There is  mild concentric left ventricular hypertrophy.   Left ventricular diastolic parameters are indeterminate.   2. Right ventricular systolic function is mildly reduced. The right  ventricular size is normal. There is mildly elevated pulmonary artery  systolic pressure. The estimated right ventricular systolic pressure is  36.5 mmHg.   3. Left atrial size was severely dilated.   4. The mitral valve is degenerative. Moderate to severe mitral valve  regurgitation. No evidence of mitral stenosis.   5. Tricuspid valve regurgitation is mild to moderate.   6. Probable moderate low-gradient, low-flow AS. The non-coronary cusp is  fixed but the RCC and LCC appears to open adequately. The aortic valve is  tricuspid. There is moderate calcification of the aortic valve. Aortic  valve regurgitation is trivial.  Moderate aortic valve stenosis. Aortic valve area, by VTI measures 1.38  cm.  Aortic valve mean gradient measures 5.7 mmHg. Aortic valve Vmax  measures 1.58 m/s.   7. The inferior vena cava is normal in size with <50% respiratory  variability, suggesting right atrial pressure of 8 mmHg.   Patient Profile     89 y.o. female with a history of CAD s/p CABG x1 (LIMA-LAD) in 2016, chronic diastolic CHF, persistent atrial fibrillation on Eliquis, LBBB, hypertension, hyperlipidemia, CVAs with memory loss, hypothyroidism, anxiety/ depression, and post-polio syndrome who presented on 02/17/2024 for increased fatigue and weakness and was found to be in rapid atrial fibrillation with RVR for which Cardiology was consulted.  Assessment & Plan    Paroxysmal Atrial Fibrillation Patient has a history of persistent atrial fibrillation (last EKG with sinus rhythm was in 06/2021). She presented in RVR with  rates as high as the 150s. Echo showed LVEF of 40-45% with global hypokinesis and LV dyssynchrony due to LBBB, mild RV dysfunction, severe left atrial enlargement, probable moderate low gradient, low gradient AS, moderate to severe MR, and mild to moderate TR. She was initially started on IV Cardizem with improvement in rates.  - Rate controlled with 60s to 70s. - Continue Toprol-XL 50mg  daily. - Continue Digoxin 0.125mg  daily. - Home Diltiazem stopped due to reduced EF. - Continue chronic anticoagulation with Eliquis 2.5mg  twice daily (reduced dose due to age and weight). - Continue rate control strategy.  Acute HFrEF BNP 896. Chest CT showed small bilateral pleural effusion (right > left) with bibasilar atelecatasis and pulmonary artery enlargement suggesting pulmonary arterial hypertension. Echo showed LVEF of 40-45% with  global hypokinesis and LV dyssynchrony due to LBBB as well as mild RV dysfunction. She was given one dose of IV Lasix on 3/24 for mild volume overload but renal function bumped with this. Net negative 1.65 L. Suspect weight from today may not be accurate but if so weight is down 14 lbs from admission. Renal function stable.  - Euvolemic on exam. - Continue Toprol-XL 50mg  daily.  - Continue Digoxin 0.125mg  daily (on this every other day at home).  - Consider adding Spironolactone if renal function continues to improve. - Would hold off on SGLT2 inhibitor given recent UTI. - Continue to monitor daily weights, strict I/Os, and renal function. - Cardiomyopathy felt to be secondary to tachycardia mediate +/- LBBB.   Elevated Troponin CAD s/p CABG History of CABG with LIMA to LAD in 2016. High-sensitivity troponin minimally elevated and flat at 39 >> 42 >> 41 >> 44. Echo as above. -  No chest pain. - Troponin elevation consistent with demand ischemia from rapid atrial fibrillation. - No aspirin due to need for full anticoagulation. - Intolerant to statins and Zetia in the  past.   Low Gradient Low Flow Moderate Aortic Stenosis Moderate to Severe Mitral Regurgitation Mild to Moderate Tricuspid Regurgitation Noted on Echo this admission. - Can continue to monitor as an outpatient but do not think she would be a good candidate for any valvular surgery given advanced age, frailty, and underlying memory issues.  Hypertension BP reasonably well controlled given advanced age. - Continue medications for CHF as above.  AKI Baseline creatinine around 0.9. Creatinine was 0.93 on admission but peaked at 1.36 on 3/25 after IV Lasix. - Creatinine improved to 1.12 today. - Continue to monitor   Deconditioned - Will consult PT.  Otherwise, per primary team: - UTI - Anemia - Hypothyroidism - History of CVA - Anxiety/ depression - Memory loss  [facility] will sign off.  Medication Recommendations: Eliquis 2.5 mg twice daily, digoxin 125 mcg daily, Toprol-XL 50 mg daily Other recommendations (labs, testing, etc): BMET in 1 week Follow up as an outpatient: We will schedule   For questions or updates, please contact [facility] Please consult [email or web address] for contact info under     Signed, Corrin Parker, PA-C  02/20/2024, 7:18 AM     Patient seen and examined.  Agree with above documentation.  On exam, patient is alert and oriented, irregular rhythm, normal rate, 2/6 systolic murmur, lungs CTAB, no LE edema or JVD.  Creatinine improved to 1.1.  Telemetry shows rate controlled atrial fibrillation.  Okay for discharge from cardiac standpoint.  Would discharge on Eliquis, metoprolol, and digoxin.  Little Ishikawa, MD

## 2024-02-20 NOTE — Discharge Summary (Signed)
 [facility]  Physician Discharge Summary   Patient ID: Emily Wagner MRN: [id number] DOB/AGE: [birth year] 89 y.o.  Admit date: 02/17/2024 Discharge date: 02/20/2024    PCP: [name], Elige Radon, MD  DISCHARGE DIAGNOSES:    Atrial fibrillation with RVR (HCC)   Essential hypertension   Adjustment disorder with mixed anxiety and depressed mood   CAD (coronary artery disease)   Hx of ischemic right MCA stroke   Acquired hypothyroidism   Normocytic anemia   Chronic diastolic CHF (congestive heart failure) (HCC)   Acute systolic heart failure (HCC)   AKI (acute kidney injury) (HCC)   RECOMMENDATIONS FOR OUTPATIENT FOLLOW UP: Cardiology to schedule outpatient follow-up Palliative care to see the patient in outpatient setting.  Daughter interested in involving hospice eventually.   Home Health: PT RN Equipment/Devices: None  CODE STATUS: DNR  DISCHARGE CONDITION: fair  Diet recommendation: As before  INITIAL HISTORY: 89 y.o. female with medical history significant of paroxysmal Atrial fibrillation, anxiety and depression, HLD, hx. of CABG,  hx of CVA, dementia, hypothyroidism, LBBB who presented to ED with complaints of light headedness with exertion and atrial fib with RVR. Daughter states that patient lost her sister about 3 weeks ago and then her best friend lost her daughter, since Friday she has been going into A-fib with RVR and becoming symptomatic with these episodes.  She was seen in the ED on Friday and was found to have a UTI.  Her digoxin dose was increased to daily from every other day and she was sent home.  She continued to have symptomatic episodes with exertion which prompted her to come to the ED today. ED workup : CT chest shows small right larger than left pleural effusion.  Pulmonary artery hypertension.  Patient was started on Cardizem gtt. and she was admitted for further evaluation. Cardiology consulted.    HOSPITAL COURSE:   A-fib with RVR: She  presented with recurrent episodes of atrial fibrillation with RVR while exertion. She was initiated on Cardizem gtt and heart rate better controlled.  Echocardiogram shows LVEF 40 to 45%, LV dyssynchrony due to LBBB. Digoxin level within normal limits. Cardizem discontinued given reduced LVEF. Patient noted to be on digoxin, metoprolol, Eliquis. Heart rate is reasonably well-controlled.   Acute on chronic systolic and diastolic CHF (congestive heart failure) (HCC) CT chest with bilateral small pleural effusion and cardiomegaly.  BNP 896 ,  Effusions likely from recurrent atrial fib with RVR Patient underwent echocardiogram which showed LVEF of 40 to 45%.  Concentric LVH was noted.  Aortic stenosis was noted. Patient was given IV furosemide.  Holding further diuresis due to rising creatinine. Creatinine noted to be better today.  Will not be discharged on any diuretics going forward.   Normocytic anemia No overt bleeding and denies bleeding    Acquired hypothyroidism: Continue synthroid daily    CAD (coronary artery disease) Hx of CABG Continue medical management .   Adjustment disorder with mixed anxiety and depressed mood Continue paxil .   Essential hypertension Continue Toprol XL 50 mg daily.   Recent UTI Looks like she was started on cephalexin for a urinary tract infection in the outpatient setting.  This is being continued here in the hospital.   Cleared by cardiology for discharge.  Discussed with patient's daughter.  Okay for discharge today.   PERTINENT LABS:  The results of significant diagnostics from this hospitalization (including imaging, microbiology, ancillary and laboratory) are listed below for reference.  Labs:   Basic Metabolic Panel: Recent Labs  Lab 02/15/24 1306 02/15/24 1402 02/17/24 1129 02/18/24 0519 02/19/24 0422 02/20/24 0505  NA 140  --  140 137 138 139  K 4.2  --  4.3 3.5 3.7 3.8  CL 105  --  106 107 105 103  CO2 26  --   24 23 25 23   GLUCOSE 104*  --  106* 89 95 86  BUN 23  --  21 20 21 21   CREATININE 0.90  --  0.93 1.15* 1.36* 1.12*  CALCIUM 9.8  --  8.9 8.2* 8.5* 9.0  MG  --  2.0 1.7 1.7 1.7  --    Liver Function Tests: Recent Labs  Lab 02/15/24 1402  AST 18  ALT 8  ALKPHOS 107  BILITOT 1.3*  PROT 7.0  ALBUMIN 4.0   Recent Labs  Lab 02/15/24 1402  LIPASE 25    CBC: Recent Labs  Lab 02/15/24 1306 02/17/24 1129 02/18/24 0519  WBC 6.7 8.2 6.2  HGB 11.6* 10.7* 10.1*  HCT 36.1 33.8* 30.9*  MCV 92.6 94.7 92.2  PLT 246 236 209    BNP: BNP (last 3 results) Recent Labs    02/17/24 1630  BNP 896.7*     CBG: Recent Labs  Lab 02/15/24 1313  GLUCAP 86     IMAGING STUDIES ECHOCARDIOGRAM COMPLETE Result Date: 02/18/2024    ECHOCARDIOGRAM REPORT   Patient Name:   VALIA WINGARD Date of Exam: 02/18/2024 Medical Rec #:  [id number]         Height:       64.0 in Accession #:    [id number]        Weight:       113.5 lb Date of Birth:  [birth year]         BSA:          1.538 m Patient Age:    89 years          BP:           134/76 mmHg Patient Gender: F                 HR:           65 bpm. Exam Location:  Inpatient Procedure: 2D Echo, Cardiac Doppler and Color Doppler (Both Spectral and Color            Flow Doppler were utilized during procedure). Indications:    Atrial fibrillation  History:        Patient has prior history of Echocardiogram examinations, most                 recent 01/26/2016. CHF, CAD, Stroke, Arrythmias:LBBB; Risk                 Factors:Hypertension.  Sonographer:    Vern Claude Referring Phys: [id number] ALLISON WOLFE IMPRESSIONS  1. LV dyssynchrony due to LBBB. Left ventricular ejection fraction, by estimation, is 40 to 45%. The left ventricle has mildly decreased function. The left ventricle demonstrates global hypokinesis. There is mild concentric left ventricular hypertrophy.  Left ventricular diastolic parameters are indeterminate.  2. Right ventricular systolic function  is mildly reduced. The right ventricular size is normal. There is mildly elevated pulmonary artery systolic pressure. The estimated right ventricular systolic pressure is 36.5 mmHg.  3. Left atrial size was severely dilated.  4. The mitral valve is degenerative. Moderate to severe mitral valve regurgitation. No evidence of mitral stenosis.  5.  Tricuspid valve regurgitation is mild to moderate.  6. Probable moderate low-gradient, low-flow AS. The non-coronary cusp is fixed but the RCC and LCC appears to open adequately. The aortic valve is tricuspid. There is moderate calcification of the aortic valve. Aortic valve regurgitation is trivial. Moderate aortic valve stenosis. Aortic valve area, by VTI measures 1.38 cm. Aortic valve mean gradient measures 5.7 mmHg. Aortic valve Vmax measures 1.58 m/s.  7. The inferior vena cava is normal in size with <50% respiratory variability, suggesting right atrial pressure of 8 mmHg. FINDINGS  Left Ventricle: LV dyssynchrony due to LBBB. Left ventricular ejection fraction, by estimation, is 40 to 45%. The left ventricle has mildly decreased function. The left ventricle demonstrates global hypokinesis. The left ventricular internal cavity size  was normal in size. There is mild concentric left ventricular hypertrophy. Abnormal (paradoxical) septal motion, consistent with left bundle branch block. Left ventricular diastolic parameters are indeterminate. Right Ventricle: The right ventricular size is normal. No increase in right ventricular wall thickness. Right ventricular systolic function is mildly reduced. There is mildly elevated pulmonary artery systolic pressure. The tricuspid regurgitant velocity  is 2.67 m/s, and with an assumed right atrial pressure of 8 mmHg, the estimated right ventricular systolic pressure is 36.5 mmHg. Left Atrium: Left atrial size was severely dilated. Right Atrium: Right atrial size was normal in size. Pericardium: There is no evidence of pericardial  effusion. Mitral Valve: The mitral valve is degenerative in appearance. Moderate to severe mitral valve regurgitation. No evidence of mitral valve stenosis. MV peak gradient, 4.5 mmHg. The mean mitral valve gradient is 1.0 mmHg. Tricuspid Valve: The tricuspid valve is normal in structure. Tricuspid valve regurgitation is mild to moderate. No evidence of tricuspid stenosis. Aortic Valve: Probable moderate low-gradient, low-flow AS. The non-coronary cusp is fixed but the RCC and LCC appears to open adequately. The aortic valve is tricuspid. There is moderate calcification of the aortic valve. Aortic valve regurgitation is trivial. Moderate aortic stenosis is present. Aortic valve mean gradient measures 5.7 mmHg. Aortic valve peak gradient measures 9.9 mmHg. Aortic valve area, by VTI measures 1.38 cm. Pulmonic Valve: The pulmonic valve was normal in structure. Pulmonic valve regurgitation is not visualized. No evidence of pulmonic stenosis. Aorta: The aortic root is normal in size and structure. Venous: The inferior vena cava is normal in size with less than 50% respiratory variability, suggesting right atrial pressure of 8 mmHg. IAS/Shunts: No atrial level shunt detected by color flow Doppler.  LEFT VENTRICLE PLAX 2D LVIDd:         4.40 cm      Diastology LVIDs:         3.20 cm      LV e' medial:    4.03 cm/s LV PW:         0.70 cm      LV E/e' medial:  25.8 LV IVS:        0.80 cm      LV e' lateral:   7.01 cm/s LVOT diam:     1.90 cm      LV E/e' lateral: 14.8 LV SV:         33 LV SV Index:   22 LVOT Area:     2.84 cm  LV Volumes (MOD) LV vol d, MOD A2C: 125.0 ml LV vol d, MOD A4C: 144.0 ml LV vol s, MOD A2C: 46.3 ml LV vol s, MOD A4C: 68.2 ml LV SV MOD A2C:     78.7 ml LV  SV MOD A4C:     144.0 ml LV SV MOD BP:      79.6 ml RIGHT VENTRICLE             IVC RV Basal diam:  3.50 cm     IVC diam: 1.10 cm RV Mid diam:    2.30 cm RV S prime:     10.90 cm/s TAPSE (M-mode): 1.8 cm LEFT ATRIUM              Index         RIGHT ATRIUM           Index LA diam:        3.70 cm  2.41 cm/m   RA Area:     14.70 cm LA Vol (A2C):   120.0 ml 78.03 ml/m  RA Volume:   30.00 ml  19.51 ml/m LA Vol (A4C):   62.7 ml  40.77 ml/m LA Biplane Vol: 91.4 ml  59.43 ml/m  AORTIC VALVE                     PULMONIC VALVE AV Area (Vmax):    1.25 cm      PV Vmax:       0.80 m/s AV Area (Vmean):   1.21 cm      PV Peak grad:  2.6 mmHg AV Area (VTI):     1.38 cm AV Vmax:           157.67 cm/s AV Vmean:          107.833 cm/s AV VTI:            0.242 m AV Peak Grad:      9.9 mmHg AV Mean Grad:      5.7 mmHg LVOT Vmax:         69.70 cm/s LVOT Vmean:        45.900 cm/s LVOT VTI:          0.118 m LVOT/AV VTI ratio: 0.49  AORTA Ao Root diam: 3.00 cm Ao Asc diam:  3.30 cm MITRAL VALVE                TRICUSPID VALVE MV Area (PHT): 6.83 cm     TR Peak grad:   28.5 mmHg MV Area VTI:   1.33 cm     TR Vmax:        267.00 cm/s MV Peak grad:  4.5 mmHg MV Mean grad:  1.0 mmHg     SHUNTS MV Vmax:       1.06 m/s     Systemic VTI:  0.12 m MV Vmean:      51.7 cm/s    Systemic Diam: 1.90 cm MV Decel Time: 111 msec MV E velocity: 104.00 cm/s Arvilla Meres MD Electronically signed by Arvilla Meres MD Signature Date/Time: 02/18/2024/11:36:27 AM    Final    CT CHEST WO CONTRAST Result Date: 02/17/2024 CLINICAL DATA:  Chronic cough. Recent diagnosis of urinary tract infection. Weakness. Dementia. EXAM: CT CHEST WITHOUT CONTRAST TECHNIQUE: Multidetector CT imaging of the chest was performed following the standard protocol without IV contrast. RADIATION DOSE REDUCTION: This exam was performed according to the departmental dose-optimization program which includes automated exposure control, adjustment of the mA and/or kV according to patient size and/or use of iterative reconstruction technique. COMPARISON:  Chest radiograph 2 days ago.  CT of 12/11/2023 FINDINGS: Cardiovascular: Mild motion and patient arm position degradation. Tortuous thoracic aorta. Aortic  atherosclerosis. Moderate cardiomegaly,  without pericardial effusion. Median sternotomy for CABG. Aortic valve calcifications are nonspecific in this age group. Pulmonary artery enlargement, outflow tract 3.2 cm. Mediastinum/Nodes: No mediastinal or hilar adenopathy, given limitations of unenhanced CT. Tiny hiatal hernia. Lungs/Pleura: Small bilateral pleural effusions, increased on the right and new on the left. Right hemidiaphragm elevation.  Mild right base atelectasis. Minimal dependent left base subsegmental atelectasis. Upper Abdomen: Cholecystectomy. Subcentimeter low-density right hepatic lobe lesion is similar and likely a cyst. Normal imaged portions of the spleen, pancreas, adrenal glands, left kidney. Right renal sinus cysts. Upper pole subcentimeter low-density right renal lesion is too small to characterize but most likely a cyst. No specific follow-up indicated. Musculoskeletal: Intact sternotomy wires. Remote right rib fractures. Accentuation of expected thoracic kyphosis. Moderate T3 compression deformity is similar. No change in a mild T1 compression deformity. IMPRESSION: 1. Mild motion and patient arm position degradation. 2. Small, right larger than left pleural effusions with bibasilar atelectasis. 3. Pulmonary artery enlargement suggests pulmonary arterial hypertension. Electronically Signed   By: Jeronimo Greaves M.D.   On: 02/17/2024 12:22   DG Chest Port 1 View Result Date: 02/15/2024 CLINICAL DATA:  Shortness of breath EXAM: PORTABLE CHEST 1 VIEW COMPARISON:  10/23/2018, CT 12/11/2023 FINDINGS: Post sternotomy changes. Mild chronic elevation of the right diaphragm. Probable pleuroparenchymal scarring at the left base. Small right-sided pleural effusion with right basilar airspace disease. Linear scarring in the right mid lung. Irregular airspace opacity at the right apex. IMPRESSION: Small right pleural effusion with right basilar airspace disease, atelectasis versus pneumonia. Irregular  airspace opacity at the right apex, not definitively seen on January CT comparison, question pneumonia, but mass not excluded. Radiographic follow-up to resolution is recommended, alternatively CT could be obtained for further assessment. Electronically Signed   By: Jasmine Pang M.D.   On: 02/15/2024 16:18    DISCHARGE EXAMINATION: Vitals:   02/19/24 2122 02/20/24 0454 02/20/24 0808 02/20/24 1017  BP: (!) 145/92 (!) 138/91 138/82   Pulse: 71 72 75 73  Resp: (!) 22 16 (!) 21   Temp: 98 F (36.7 C) 97.6 F (36.4 C) 97.9 F (36.6 C)   TempSrc: Oral Oral Oral   SpO2: 92% 92% 96%   Weight:  45.4 kg    Height:       General appearance: Awake alert.  In no distress Resp: Clear to auscultation bilaterally.  Normal effort Cardio: S1-S2 is normal regular.  No S3-S4.  No rubs murmurs or bruit GI: Abdomen is soft.  Nontender nondistended.  Bowel sounds are present normal.  No masses organomegaly   DISPOSITION: Home with daughter  Discharge Instructions     Call MD for:  difficulty breathing, headache or visual disturbances   Complete by: As directed    Call MD for:  extreme fatigue   Complete by: As directed    Call MD for:  persistant dizziness or light-headedness   Complete by: As directed    Call MD for:  persistant nausea and vomiting   Complete by: As directed    Call MD for:  severe uncontrolled pain   Complete by: As directed    Call MD for:  temperature >100.4   Complete by: As directed    Diet - low sodium heart healthy   Complete by: As directed    Discharge instructions   Complete by: As directed    Please take your medications as prescribed. Cardiology will arrange outpatient follow up.  You were cared for by a hospitalist during  your hospital stay. If you have any questions about your discharge medications or the care you received while you were in the hospital after you are discharged, you can call the unit and asked to speak with the hospitalist on call if the  hospitalist that took care of you is not available. Once you are discharged, your primary care physician will handle any further medical issues. Please note that NO REFILLS for any discharge medications will be authorized once you are discharged, as it is imperative that you return to your primary care physician (or establish a relationship with a primary care physician if you do not have one) for your aftercare needs so that they can reassess your need for medications and monitor your lab values. If you do not have a primary care physician, you can call [phone] for a physician referral.   Increase activity slowly   Complete by: As directed          Allergies as of 02/20/2024       Reactions   Nitrofurantoin Other (See Comments)   Liver failure   Nsaids Other (See Comments)   Kidney failure   Other Other (See Comments)   Most antibiotics require supervision per patient   Beta Adrenergic Blockers Other (See Comments)   fatigue   Flonase [fluticasone Propionate] Other (See Comments)   headache   Prevnar 13 [pneumococcal 13-val Conj Vacc] Other (See Comments)   Nerve damage in left arm   Propranolol Hcl Other (See Comments)   Unknown   Hydrocodone Other (See Comments)   Alendronate Sodium Other (See Comments)   Ciprofloxacin Other (See Comments)   Patient denies allergy--"I take Cipro for bladder infections"   Codeine Other (See Comments)   Crestor [rosuvastatin Calcium] Other (See Comments)   Darvon Other (See Comments)   Lipitor [atorvastatin Calcium] Other (See Comments)   Nortriptyline Hcl Other (See Comments)   States she felt like worms where crawling under her skin.   Potassium-containing Compounds    Rash and raw areas / sore breast    Pravachol Other (See Comments)   Sulfa Antibiotics Other (See Comments)   Sulfamethoxazole Other (See Comments)   Unknown   Ultram [tramadol Hcl] Itching   Welchol [colesevelam Hcl] Other (See Comments)   Zetia [ezetimibe] Other (See  Comments)   Zocor [simvastatin] Other (See Comments)        Medication List     STOP taking these medications    diltiazem 30 MG tablet Commonly known as: CARDIZEM       TAKE these medications    acetaminophen 325 MG tablet Commonly known as: TYLENOL Take 325 mg by mouth every 6 (six) hours as needed.   apixaban 2.5 MG Tabs tablet Commonly known as: ELIQUIS Take 1 tablet (2.5 mg total) by mouth 2 (two) times daily.   cephALEXin 500 MG capsule Commonly known as: KEFLEX Take 1 capsule (500 mg total) by mouth 4 (four) times daily.   cholecalciferol 25 MCG (1000 UNIT) tablet Commonly known as: VITAMIN D3 Take 1,000 Units by mouth in the morning.   digoxin 0.125 MG tablet Commonly known as: LANOXIN TAKE 1 TABLET BY MOUTH DAILY What changed: when to take this   famotidine 20 MG tablet Commonly known as: PEPCID TAKE 1 TABLET BY MOUTH TWICE A DAY What changed: when to take this   levothyroxine 50 MCG tablet Commonly known as: SYNTHROID Take 1 tablet (50 mcg total) by mouth daily before breakfast.   metoprolol succinate 50 MG  24 hr tablet Commonly known as: TOPROL-XL Take 1 tablet (50 mg total) by mouth daily. Take with or immediately following a meal.   PARoxetine 20 MG tablet Commonly known as: PAXIL Take 1 tablet (20 mg total) by mouth daily. What changed: when to take this   PRESERVISION AREDS 2 PO Take 1 capsule by mouth 2 (two) times daily.   QUEtiapine 50 MG tablet Commonly known as: SEROQUEL TAKE 1 TABLET BY MOUTH EVERYDAY AT BEDTIME What changed: See the new instructions.   VITAMIN B-12 PO Take 500 mcg by mouth in the morning.          Follow-up Information     [facility] Follow up.   Specialty: Home Health Services Why: Physical Therapy, Aide, Clinical Social Worker-office to call with visit times Contact information: [address] [place] [address] [phone]         Corrin Parker, PA-C Follow up.    Specialty: Cardiology Why: Hospital follow-up with Cardiology scheduled for 03/04/2024 at 10:55am. Please arrive 15 minutes early for check-in. If this date/ time does not work for you, please call our office to reschedule. Contact information: [address] [place] [address] [place] [address] [phone]         [facility] Follow up.   Specialty: Hospice Why: [facility]-office to call within 2-3 weeks. Contact information: [address] [place] [address] [phone]                TOTAL DISCHARGE TIME: 35 minutes  Rakia Frayne Rito Ehrlich  [facility] Pager on [email or web address]  02/21/2024, 10:46 AM

## 2024-02-22 NOTE — Progress Notes (Addendum)
 Cardiology Office Note:    Date:  03/04/2024   ID:  Emily Wagner, DOB 01-Aug-1933, MRN [id number]  PCP:  [name], Lucio Sabin, MD  Cardiologist:  Eilleen Grates, MD     Referring MD: [name], Lucio Sabin, MD   Chief Complaint: hospital follow-up of atrial fibrillation  History of Present Illness:    Emily Wagner is a 89 y.o. 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 Lavonne Prairie and presents today for hospital follow-up of atrial fibrillation.  Patient has a history of CAD with prior single vessel CABG with LIMA to LAD in 10/2015. Echo at that time showed LVEF of 60-65% with mild LVH and grade 1 diastolic dysfunction. She also has a history of chronic diastolic CHF. She suffered a CVA in 03/2021 and underwent a thrombectomy. Outpatient monitor following this revealed paroxysmal atrial fibrillation and she was started on anticoagulation. She is now felt to have persistent atrial fibrillation.  At visit in 11/2022, she reported intermittent chest pain. Nuclear stress test was offered but patient declined and stated she would never do a stress test again given how poorly she felt during the last one.   She was last seen by Dr. Lavonne Prairie in 03/2023 at which time she had multiple complaints. Her main complaint was fatigue but she her chest pain was better. She had recently been treated for [facility] Fever and a UTI. No changes were made.  She was recently admitted from 02/17/2024 to 02/20/2024 for atrial fibrillation with RVR after presenting with increased fatigue and weakness. Rates were initially in the 150s. Echo showed LVEF of 40-45% with global hypokinesis and LV dyssynchrony due to LBBB, mildly RV function, severe left atrial  enlargement, probable moderate low-gradient low-flow aortic stenosis, moderate to severe mitral regurgitation, and mild to moderate tricuspid regurgitation. She was initially was started on IV Cardizem with improvement in rates but this was stopped due to reduced EF. She was was started on Toprol-XL and continued on home Digoxin. High-sensitivity troponin was minimally elevated in the 30s to 40s which was felt to be secondary to demand ischemia. BNP was elevated in the 800s and chest x-ray showed small bilateral pleural effusion so he was given one dose of IV Lasix on admission but had worsening renal function with this so was not given any more diuretics.   Patient presents today for follow-up. Here with her daughter. She continues to have fatigue and weakness but daughter attributes this to her post-polio syndrome and states this is not new. She is doing okay from a cardiac standpoint. She denies any chest pain, shortness of breath, orthopnea, PND, edema. No significant palpitations, lightheadedness, dizziness, or syncope. She is unsteady on her feet and uses a walker to ambulate but denies any falls since discharge. She is following with Palliative Care as an outpatient.   EKGs/Labs/Other Studies Reviewed:    The following studies were reviewed:  Left Cardiac Catheterization 10/29/2015: Mid RCA to Dist RCA lesion, 60% stenosed. Ost LAD to Prox LAD lesion, 75% stenosed. Prox LAD to Mid LAD lesion, 90% stenosed. 2nd Diag lesion, 75% stenosed. Ost 1st Diag-2 lesion, 75% stenosed. Ost 1st Diag-1 lesion, 75% stenosed.   Ostial to proximal ulcerated, heavily calcified, and tortuous 90+ percent stenosis representing significant progression  over the past 8 years. There are also 3 moderate to large diagonal branches that arise from the segmental LAD stenosis. Each diagonal contains ostial narrowing. Widely patent circumflex and 60% mid right coronary.  Severe three-vessel coronary tortuosity. Overall  normal left ventricular systolic function.   Recommendations: I have counseled the patient to have surgical consultation and heart team approach. PCI would have a higher than usual ischemic and technical risk of complications. Off pump LIMA to LAD and possibly diagonal grafting would probably be safer and more durable. Final decision about will depend become clearer after discussion with heart team colleagues. IV heparin IV nitroglycerin Much remain hospitalized until treatment achieved. _______________  Monitor 07/12/2021 to 08/10/2021: Normal sinus rhythm Paroxysmal atrial fibrillation. Some runs of atrial fib with rapid ventricular rate Frequent premature beats and ventriclar beats.   No symptoms reported.  _______________  Echocardiogram 02/18/2024: Impressions: 1. LV dyssynchrony due to LBBB. Left ventricular ejection fraction, by  estimation, is 40 to 45%. The left ventricle has mildly decreased  function. The left ventricle demonstrates global hypokinesis. There is  mild concentric left ventricular hypertrophy.   Left ventricular diastolic parameters are indeterminate.   2. Right ventricular systolic function is mildly reduced. The right  ventricular size is normal. There is mildly elevated pulmonary artery  systolic pressure. The estimated right ventricular systolic pressure is  36.5 mmHg.   3. Left atrial size was severely dilated.   4. The mitral valve is degenerative. Moderate to severe mitral valve  regurgitation. No evidence of mitral stenosis.   5. Tricuspid valve regurgitation is mild to moderate.   6. Probable moderate low-gradient, low-flow AS. The non-coronary cusp is  fixed but the RCC and LCC appears to open adequately. The aortic valve is  tricuspid. There is moderate calcification of the aortic valve. Aortic  valve regurgitation is trivial.  Moderate aortic valve stenosis. Aortic valve area, by VTI measures 1.38  cm. Aortic valve mean gradient measures 5.7  mmHg. Aortic valve Vmax  measures 1.58 m/s.   7. The inferior vena cava is normal in size with <50% respiratory  variability, suggesting right atrial pressure of 8 mmHg.    EKG:  EKG ordered today.   EKG Interpretation Date/Time:  Tuesday March 04 2024 10:52:38 EDT Ventricular Rate:  49 PR Interval:    QRS Duration:  118 QT Interval:  416 QTC Calculation: 375 R Axis:   110  Text Interpretation: Atrial fibrillation with slow ventricular response Right axis deviation Down-sloping ST depressions in inferior leads Horizontal ST depressions in leads V5-V6 Confirmed by Sharren Decree [phone]) on 03/04/2024 10:58:12 AM    Recent Labs: 02/17/2024: B Natriuretic Peptide 896.7 02/19/2024: Magnesium 1.7 03/03/2024: ALT 9; BUN 22; Creatinine, Ser 1.16; Hemoglobin 12.1; Platelets 219; Potassium 4.4; Sodium 139; TSH 3.880  Recent Lipid Panel    Component Value Date/Time   CHOL 150 03/03/2024 1059   TRIG 99 03/03/2024 1059   TRIG 162 (H) 06/24/2015 0918   HDL 38 (L) 03/03/2024 1059   HDL 44 06/24/2015 0918   CHOLHDL 3.9 03/03/2024 1059   CHOLHDL 5.4 10/31/2015 0215   VLDL 20 10/31/2015 0215   LDLCALC 94 03/03/2024 1059   LDLCALC 163 (H) 05/14/2014 1038    Physical Exam:    Vital Signs: BP 118/80   Pulse (!) 49   Ht 5\' 4"  (1.626 m)   Wt 110 lb 6.4 oz (50.1 kg)   LMP 03/21/1971   SpO2 97%   BMI 18.95 kg/m  Wt Readings from Last 3 Encounters:  03/04/24 110 lb 6.4 oz (50.1 kg)  03/03/24 109 lb (49.4 kg)  02/20/24 100 lb (45.4 kg)     General: 89 y.o. thin Caucasian female in no acute distress. HEENT: Normocephalic and atraumatic. Sclera clear.  Neck: Supple. No JVD. Heart: Irregularly irregular rhythm with normal rate. Soft I-II/VI systolic murmur.  Lungs: No increased work of breathing. Clear to ausculation bilaterally. No wheezes, rhonchi, or rales.  Extremities: No lower extremity edema.   Skin: Warm and dry. Neuro: No focal deficits. Psych: Normal affect. Responds  appropriately.  Assessment:    1. Persistent atrial fibrillation (HCC)   2. Essential hypertension   3. Chronic heart failure with mildly reduced ejection fraction (HFmrEF, 41-49%) (HCC)   4. Coronary artery disease involving native coronary artery of native heart without angina pectoris   5. S/P CABG (coronary artery bypass graft)   6. Aortic valve stenosis, etiology of cardiac valve disease unspecified   7. Mitral valve insufficiency, unspecified etiology   8. Tricuspid valve insufficiency, unspecified etiology   9. Primary hypertension   10. Hyperlipidemia, unspecified hyperlipidemia type   11. AKI (acute kidney injury) (HCC)   12. Medication management     Plan:    Persistent Atrial Fibrillation Patient has a history of persistent atrial fibrillation (last EKG with sinus rhythm was in 06/2021). She was recently admitted with atrial fibrillation with RVR. Echo showed LVEF of 40-45% with global hypokinesis and LV dyssynchrony due to LBBB. - EKG shows atrial fibrillation with rate of 49 bpm.  - Will decrease Toprol-XL to 25mg  daily. - Digoxin recently increased from 0.125mg  every other day to 0.125mg  daily. Digoxin level was 0.7 on 02/15/2024 and then 1/3 on 02/17/2024 after it was increased to daily use. Will check Digoxin level. She has already taken her Digoxin today so she will come back for this within the next 2 days.  - Continue chronic anticoagulation with Eliquis 2.5mg  twice daily (reduced dose due to age and weight). - Plan is for continued rate control strategy.   Chronic HFmrEF Echo during recent admission in 01/2024 showed LVEF of 40-45% with  global hypokinesis and LV dyssynchrony due to LBBB as well as mild RV dysfunction. EF had dropped from 60-65% in 2016. - Euvolemic on exam. - Will decrease Toprol-XL to 25mg  daily given bradycardia.  - Continue Digoxin 0.125mg  daily for now. Will recheck a Digoxin level as above. - Discussed adding low dose Losartan or Spironolactone.  However, daughter would like to hold off at this time. Given her advance age, I think this is reasonable. Can re-discuss at visit next month. - Will hold off on SGLT2 inhibitor given recent UTI. - Discussed daily weights and sodium/ fluid restrictions.  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 Given advanced age, will treat medically. Will defer need for repeat Echo to reassess LV function to primary Cardiologist  CAD s/p CABG History of CABG with LIMA to LAD in 2016. - EKG today does show some mild down-sloping ST depressions in inferior leads and mild horizontal ST depression in leads V5-V6 which appear to be more pronounced compared to prior tracings.  - No chest pain or shortness of breath. - No aspirin due to need for full anticoagulation. - Intolerant to statins and Zetia in the past.  - Given advanced age, frailty, and the fact that she is asymptomatic, do not recommend any additional work-up for EKG changes at this time. Family is in agreement with  this.   Low Gradient Low Flow Moderate Aortic Stenosis Moderate to Severe Mitral Regurgitation Mild to Moderate Tricuspid Regurgitation Noted on Echo during recent admission in 01/2024.  - Do not thinks she would be a good candidate for any valve surgery given advanced age, frailty, and underlying memory issues. She also states she would not want surgery.    Hypertension BP well controlled.  - Continue medications for CHF as above.  Hyperlipidemia Lipid panel on 03/03/2024: Total Cholesterol 150, Triglycerides 99, HDL 38, LDL 94. - Intolerant to statins and Zetia in the past. LDL reasonably well controlled. No need to be aggressive in treating this given advanced age.    AKI Baseline creatinine around 0.9. Creatinine peaked at 1.36 during recent admission in 01/2024 after a dose of IV Lasix. Creatinine improved to 1.12 on day of discharge. Repeat BMET at PCP's office yesterday showed creatinine was stable at 1.16.    Disposition: Patient already has a follow-up visit with Dr. Lavonne Prairie next month. Can keep this.    Signed, Casimer Clear, PA-C  03/04/2024 7:02 PM    [facility]  ADDENDUM 03/12/2024: Patient had her Digoxin level checked yesterday and it came back at 2.0 which is too high. Suspect this explains her EKG changes and bradycardia. Will have her hold Digoxin for 3 days and then start taking it every other day instead of daily (she has previously tolerated this dose). Will then recheck a Digoxin level in 7-10 days. Reviewed this plan with Pharmacy who agreed.   Rylynn Kobs E Lucas Winograd, PA-C 03/12/2024 11:03 AM

## 2024-02-25 ENCOUNTER — Telehealth: Payer: Self-pay | Admitting: Family Medicine

## 2024-02-25 DIAGNOSIS — I4891 Unspecified atrial fibrillation: Secondary | ICD-10-CM | POA: Diagnosis not present

## 2024-02-25 NOTE — Telephone Encounter (Signed)
 Copied from CRM [id number]. Topic: Clinical - Home Health Verbal Orders >> Feb 25, 2024  3:01 PM Geroge Baseman wrote: Caller/Agency: Michelle[facility] Callback Number: [phone], ok to leave voicemail Service Requested: Physical Therapy Frequency: 1w9 Any new concerns about the patient? Yes, needs nursing evaluation

## 2024-02-25 NOTE — Telephone Encounter (Signed)
 Verbal given

## 2024-02-26 ENCOUNTER — Telehealth: Payer: Self-pay | Admitting: Family Medicine

## 2024-02-26 DIAGNOSIS — I4891 Unspecified atrial fibrillation: Secondary | ICD-10-CM | POA: Diagnosis not present

## 2024-02-26 NOTE — Telephone Encounter (Signed)
 Copied from CRM [id number]. Topic: Clinical - Home Health Verbal Orders >> Feb 26, 2024  3:58 PM Emily Wagner wrote: Caller/Agency: [facility] per Havery Moros Callback Number: [phone] secure line Service Requested: Social Work Frequency: 1 week 2 Any new concerns about the patient? No

## 2024-02-26 NOTE — Telephone Encounter (Signed)
 Verbal given

## 2024-02-27 ENCOUNTER — Telehealth: Payer: Self-pay

## 2024-02-27 DIAGNOSIS — I4891 Unspecified atrial fibrillation: Secondary | ICD-10-CM | POA: Diagnosis not present

## 2024-02-27 NOTE — Telephone Encounter (Signed)
 Verbal order given to continue therapy. LouAnn with [facility] made aware.

## 2024-02-27 NOTE — Telephone Encounter (Signed)
 Copied from CRM [id number]. Topic: Clinical - Home Health Verbal Orders >> Feb 27, 2024  3:16 PM Geroge Baseman wrote: Caller/Agency: [facility]avens Callback Number: [phone] Service Requested: Skilled Nursing Frequency: 1w9 with 3 as needed. Any new concerns about the patient? No

## 2024-03-03 ENCOUNTER — Ambulatory Visit: Admitting: Family Medicine

## 2024-03-03 ENCOUNTER — Encounter: Payer: Self-pay | Admitting: Family Medicine

## 2024-03-03 VITALS — BP 143/82 | HR 65 | Temp 98.0°F | Ht 64.0 in | Wt 109.0 lb

## 2024-03-03 DIAGNOSIS — I10 Essential (primary) hypertension: Secondary | ICD-10-CM

## 2024-03-03 DIAGNOSIS — N3010 Interstitial cystitis (chronic) without hematuria: Secondary | ICD-10-CM

## 2024-03-03 DIAGNOSIS — F4323 Adjustment disorder with mixed anxiety and depressed mood: Secondary | ICD-10-CM

## 2024-03-03 DIAGNOSIS — E7800 Pure hypercholesterolemia, unspecified: Secondary | ICD-10-CM

## 2024-03-03 DIAGNOSIS — I5032 Chronic diastolic (congestive) heart failure: Secondary | ICD-10-CM

## 2024-03-03 DIAGNOSIS — E039 Hypothyroidism, unspecified: Secondary | ICD-10-CM

## 2024-03-03 LAB — LIPID PANEL

## 2024-03-03 MED ORDER — QUETIAPINE FUMARATE 50 MG PO TABS: 50.0000 mg | ORAL_TABLET | Freq: Every day | ORAL | 3 refills | Status: DC

## 2024-03-03 MED ORDER — FAMOTIDINE 20 MG PO TABS: 20.0000 mg | ORAL_TABLET | Freq: Two times a day (BID) | ORAL | 3 refills | Status: AC

## 2024-03-03 NOTE — Progress Notes (Signed)
 BP (!) 143/82   Pulse 65   Temp 98 F (36.7 C)   Ht 5\' 4"  (1.626 m)   Wt 109 lb (49.4 kg)   LMP 03/21/1971   SpO2 95%   BMI 18.71 kg/m    Subjective:   Patient ID: Emily Wagner, female    DOB: 1933-09-08, 89 y.o.   MRN: [id number]  HPI: Emily Wagner is a 89 y.o. female presenting on 03/03/2024 for Medical Management of Chronic Issues (Discuss palliative care with hospice), Atrial Fibrillation, Hypertension, and Memory Loss   HPI Hypertension Patient is currently on digoxin and metoprolol, and their blood pressure today is 143/82. Patient denies any lightheadedness or dizziness. Patient denies headaches, blurred vision, chest pains, shortness of breath, or weakness. Denies any side effects from medication and is content with current medication.   Hyperlipidemia and CAD Patient is coming in for recheck of his hyperlipidemia. The patient is currently taking no medicine currently has not been able to take statins. They deny any issues with myalgias or history of liver damage from it. They deny any focal numbness or weakness or chest pain.   CHF and A-fib Patient sees cardiology for these but was recently in the emergency department for A-fib with RVR and CHF exacerbation and UTI.  She does have follow-up with cardiology but is coming here today for a routine checkup today.  She says her breathing and heart rate are doing well now and has not had any major issues.  Her energy is still down which is her biggest issue right at this point.  Adjustment disorder with anxiety and depression Patient is coming in today for adjustment disorder with anxiety depression.  She currently uses paroxetine and quetiapine for this.  Feel like moods doing well for the most part.  Relevant past medical, surgical, family and social history reviewed and updated as indicated. Interim medical history since our last visit reviewed. Allergies and medications reviewed and updated.  Review of Systems   Constitutional:  Positive for fatigue. Negative for chills and fever.  HENT:  Negative for congestion, ear discharge and ear pain.   Eyes:  Negative for redness and visual disturbance.  Respiratory:  Negative for chest tightness and shortness of breath.   Cardiovascular:  Negative for chest pain, palpitations and leg swelling.  Genitourinary:  Negative for difficulty urinating and dysuria.  Musculoskeletal:  Negative for back pain and gait problem.  Skin:  Negative for rash.  Neurological:  Positive for weakness. Negative for dizziness, light-headedness and headaches.  Psychiatric/Behavioral:  Negative for agitation and behavioral problems.   All other systems reviewed and are negative.   Per HPI unless specifically indicated above   Allergies as of 03/03/2024       Reactions   Nitrofurantoin Other (See Comments)   Liver failure   Nsaids Other (See Comments)   Kidney failure   Other Other (See Comments)   Most antibiotics require supervision per patient   Beta Adrenergic Blockers Other (See Comments)   fatigue   Flonase [fluticasone Propionate] Other (See Comments)   headache   Prevnar 13 [pneumococcal 13-val Conj Vacc] Other (See Comments)   Nerve damage in left arm   Propranolol Hcl Other (See Comments)   Unknown   Hydrocodone Other (See Comments)   Alendronate Sodium Other (See Comments)   Ciprofloxacin Other (See Comments)   Patient denies allergy--"I take Cipro for bladder infections"   Codeine Other (See Comments)   Crestor [rosuvastatin Calcium] Other (  See Comments)   Darvon Other (See Comments)   Lipitor [atorvastatin Calcium] Other (See Comments)   Nortriptyline Hcl Other (See Comments)   States she felt like worms where crawling under her skin.   Potassium-containing Compounds    Rash and raw areas / sore breast    Pravachol Other (See Comments)   Sulfa Antibiotics Other (See Comments)   Sulfamethoxazole Other (See Comments)   Unknown   Ultram [tramadol Hcl]  Itching   Welchol [colesevelam Hcl] Other (See Comments)   Zetia [ezetimibe] Other (See Comments)   Zocor [simvastatin] Other (See Comments)        Medication List        Accurate as of March 03, 2024 10:44 AM. If you have any questions, ask your nurse or doctor.          STOP taking these medications    cephALEXin 500 MG capsule Commonly known as: KEFLEX Stopped by: Elige Radon Deran Barro       TAKE these medications    acetaminophen 325 MG tablet Commonly known as: TYLENOL Take 325 mg by mouth every 6 (six) hours as needed.   apixaban 2.5 MG Tabs tablet Commonly known as: ELIQUIS Take 1 tablet (2.5 mg total) by mouth 2 (two) times daily.   cholecalciferol 25 MCG (1000 UNIT) tablet Commonly known as: VITAMIN D3 Take 1,000 Units by mouth in the morning.   digoxin 0.125 MG tablet Commonly known as: LANOXIN TAKE 1 TABLET BY MOUTH DAILY What changed: when to take this   famotidine 20 MG tablet Commonly known as: PEPCID Take 1 tablet (20 mg total) by mouth 2 (two) times daily.   levothyroxine 50 MCG tablet Commonly known as: SYNTHROID Take 1 tablet (50 mcg total) by mouth daily before breakfast.   metoprolol succinate 50 MG 24 hr tablet Commonly known as: TOPROL-XL Take 1 tablet (50 mg total) by mouth daily. Take with or immediately following a meal.   PARoxetine 20 MG tablet Commonly known as: PAXIL Take 1 tablet (20 mg total) by mouth daily. What changed: when to take this   PRESERVISION AREDS 2 PO Take 1 capsule by mouth 2 (two) times daily.   QUEtiapine 50 MG tablet Commonly known as: SEROQUEL Take 1 tablet (50 mg total) by mouth at bedtime. What changed: See the new instructions. Changed by: Elige Radon Danyon Mcginness   VITAMIN B-12 PO Take 500 mcg by mouth in the morning.         Objective:   BP (!) 143/82   Pulse 65   Temp 98 F (36.7 C)   Ht 5\' 4"  (1.626 m)   Wt 109 lb (49.4 kg)   LMP 03/21/1971   SpO2 95%   BMI 18.71 kg/m   Wt  Readings from Last 3 Encounters:  03/03/24 109 lb (49.4 kg)  02/20/24 100 lb (45.4 kg)  02/15/24 117 lb 1 oz (53.1 kg)    Physical Exam Vitals and nursing note reviewed.  Constitutional:      General: She is not in acute distress.    Appearance: She is well-developed and underweight. She is not diaphoretic.  Eyes:     Conjunctiva/sclera: Conjunctivae normal.  Cardiovascular:     Rate and Rhythm: Normal rate and regular rhythm.     Heart sounds: Normal heart sounds. No murmur heard. Pulmonary:     Effort: Pulmonary effort is normal. No respiratory distress.     Breath sounds: Normal breath sounds. No wheezing.  Neurological:  Mental Status: She is alert and oriented to person, place, and time.     Coordination: Coordination normal.  Psychiatric:        Behavior: Behavior normal.       Assessment & Plan:   Problem List Items Addressed This Visit       Cardiovascular and Mediastinum   Essential hypertension - Primary   Relevant Orders   CBC with Differential/Platelet   CMP14+EGFR   Lipid panel   Chronic diastolic CHF (congestive heart failure) (HCC)     Endocrine   Acquired hypothyroidism   Relevant Orders   TSH     Genitourinary   Interstitial cystitis   Relevant Medications   famotidine (PEPCID) 20 MG tablet     Other   HYPERCHOLESTEROLEMIA   Relevant Orders   CBC with Differential/Platelet   CMP14+EGFR   Lipid panel   Adjustment disorder with mixed anxiety and depressed mood   Relevant Medications   QUEtiapine (SEROQUEL) 50 MG tablet    Patient has been consulted with palliative care and they are coming by later this week.  Will do blood work today, will keep medicines the same, following up with cardiology later this week as well. Follow up plan: Return in about 3 months (around 06/02/2024), or if symptoms worsen or fail to improve, for Hypertension and hypothyroidism and CHF and cholesterol.  Counseling provided for all of the vaccine  components Orders Placed This Encounter  Procedures   CBC with Differential/Platelet   CMP14+EGFR   Lipid panel   [place] [id number] [facility] 03/03/2024, 10:44 AM

## 2024-03-04 ENCOUNTER — Ambulatory Visit: Attending: Student | Admitting: Student

## 2024-03-04 ENCOUNTER — Encounter: Payer: Self-pay | Admitting: Student

## 2024-03-04 VITALS — BP 118/80 | HR 49 | Ht 64.0 in | Wt 110.4 lb

## 2024-03-04 DIAGNOSIS — I10 Essential (primary) hypertension: Secondary | ICD-10-CM

## 2024-03-04 DIAGNOSIS — I340 Nonrheumatic mitral (valve) insufficiency: Secondary | ICD-10-CM

## 2024-03-04 DIAGNOSIS — N179 Acute kidney failure, unspecified: Secondary | ICD-10-CM | POA: Diagnosis not present

## 2024-03-04 DIAGNOSIS — I350 Nonrheumatic aortic (valve) stenosis: Secondary | ICD-10-CM

## 2024-03-04 DIAGNOSIS — E785 Hyperlipidemia, unspecified: Secondary | ICD-10-CM

## 2024-03-04 DIAGNOSIS — I2510 Atherosclerotic heart disease of native coronary artery without angina pectoris: Secondary | ICD-10-CM | POA: Diagnosis not present

## 2024-03-04 DIAGNOSIS — Z79899 Other long term (current) drug therapy: Secondary | ICD-10-CM | POA: Diagnosis not present

## 2024-03-04 DIAGNOSIS — Z951 Presence of aortocoronary bypass graft: Secondary | ICD-10-CM | POA: Diagnosis not present

## 2024-03-04 DIAGNOSIS — I071 Rheumatic tricuspid insufficiency: Secondary | ICD-10-CM | POA: Diagnosis not present

## 2024-03-04 DIAGNOSIS — I4819 Other persistent atrial fibrillation: Secondary | ICD-10-CM | POA: Diagnosis not present

## 2024-03-04 DIAGNOSIS — I5022 Chronic systolic (congestive) heart failure: Secondary | ICD-10-CM

## 2024-03-04 LAB — LIPID PANEL
Cholesterol, Total: 150 mg/dL (ref 100–199)
HDL: 38 mg/dL — ABNORMAL LOW (ref >39)
LDL CALC COMMENT:: 3.9 ratio (ref 0.0–4.4)
LDL Chol Calc (NIH): 94 mg/dL (ref 0–99)
Triglycerides: 99 mg/dL (ref 0–149)
VLDL Cholesterol Cal: 18 mg/dL (ref 5–40)

## 2024-03-04 LAB — CMP14+EGFR
ALT: 9 IU/L (ref 0–32)
AST: 17 IU/L (ref 0–40)
Albumin: 3.6 g/dL — ABNORMAL LOW (ref 3.7–4.7)
Alkaline Phosphatase: 112 IU/L (ref 44–121)
BUN/Creatinine Ratio: 19 (ref 12–28)
BUN: 22 mg/dL (ref 8–27)
Bilirubin Total: 0.7 mg/dL (ref 0.0–1.2)
CO2: 23 mmol/L (ref 20–29)
Calcium: 9 mg/dL (ref 8.7–10.3)
Chloride: 102 mmol/L (ref 96–106)
Creatinine, Ser: 1.16 mg/dL — ABNORMAL HIGH (ref 0.57–1.00)
Globulin, Total: 2.8 g/dL (ref 1.5–4.5)
Glucose: 92 mg/dL (ref 70–99)
Potassium: 4.4 mmol/L (ref 3.5–5.2)
Sodium: 139 mmol/L (ref 134–144)
Total Protein: 6.4 g/dL (ref 6.0–8.5)
eGFR: 45 mL/min/{1.73_m2} — ABNORMAL LOW (ref >59)

## 2024-03-04 LAB — CBC WITH DIFFERENTIAL/PLATELET
Basophils Absolute: 0 10*3/uL (ref 0.0–0.2)
Basos: 1 %
EOS (ABSOLUTE): 0.1 10*3/uL (ref 0.0–0.4)
Eos: 2 %
Hematocrit: 37.7 % (ref 34.0–46.6)
Hemoglobin: 12.1 g/dL (ref 11.1–15.9)
Immature Grans (Abs): 0 10*3/uL (ref 0.0–0.1)
Immature Granulocytes: 0 %
Lymphocytes Absolute: 1.1 10*3/uL (ref 0.7–3.1)
Lymphs: 17 %
MCH: 30.6 pg (ref 26.6–33.0)
MCHC: 32.1 g/dL (ref 31.5–35.7)
MCV: 95 fL (ref 79–97)
Monocytes Absolute: 0.7 10*3/uL (ref 0.1–0.9)
Monocytes: 11 %
Neutrophils Absolute: 4.6 10*3/uL (ref 1.4–7.0)
Neutrophils: 69 %
Platelets: 219 10*3/uL (ref 150–450)
RBC: 3.96 x10E6/uL (ref 3.77–5.28)
RDW: 15.1 % (ref 11.7–15.4)
WBC: 6.7 10*3/uL (ref 3.4–10.8)

## 2024-03-04 LAB — TSH: TSH: 3.88 u[IU]/mL (ref 0.450–4.500)

## 2024-03-04 MED ORDER — METOPROLOL SUCCINATE ER 25 MG PO TB24: 25.0000 mg | ORAL_TABLET | Freq: Every day | ORAL | 3 refills | Status: DC

## 2024-03-04 NOTE — Patient Instructions (Signed)
 Medication Instructions:  DECREASE METOPROLOL SUCCINATE TO 25 MG DAILY *If you need a refill on your cardiac medications before your next appointment, please call your pharmacy*  Lab Work: NO LABS If you have labs (blood work) drawn today and your tests are completely normal, you will receive your results only by: MyChart Message (if you have MyChart) OR A paper copy in the mail If you have any lab test that is abnormal or we need to change your treatment, we will call you to review the results.  Testing/Procedures: NO TESTING  Follow-Up: At [facility], you and your health needs are our priority.  As part of our continuing mission to provide you with exceptional heart care, our providers are all part of one team.  This team includes your primary Cardiologist (physician) and Advanced Practice Providers or APPs (Physician Assistants and Nurse Practitioners) who all work together to provide you with the care you need, when you need it.  Your next appointment:   KEEP FOLLOW UP MAY 2025   Provider:   [name], MD  Other Instructions   1st Floor: - Lobby - Registration  - Pharmacy  - Lab - Cafe  2nd Floor: - PV Lab - Diagnostic Testing (echo, CT, nuclear med)  3rd Floor: - Vacant  4th Floor: - TCTS (cardiothoracic surgery) - AFib Clinic - Structural Heart Clinic - Vascular Surgery  - Vascular Ultrasound  5th Floor: - HeartCare Cardiology (general and EP) - Clinical Pharmacy for coumadin, hypertension, lipid, weight-loss medications, and med management appointments    Valet parking services will be available as well.

## 2024-03-05 DIAGNOSIS — I4891 Unspecified atrial fibrillation: Secondary | ICD-10-CM | POA: Diagnosis not present

## 2024-03-06 DIAGNOSIS — I4891 Unspecified atrial fibrillation: Secondary | ICD-10-CM | POA: Diagnosis not present

## 2024-03-07 ENCOUNTER — Encounter: Payer: Self-pay | Admitting: Family Medicine

## 2024-03-10 NOTE — Telephone Encounter (Signed)
 Spoke to pt daughter Blaise Bumps. Verified name and DOB. [name] poke to someone at [employer] this morning, they confirmed lab order is in system, they told pt daughter she needs appt and pt is currently waiting for appt to be set up. Informed patient daughter to hold Digoxin on day of labs until labs drawn, Blaise Bumps verbalized understanding.   Josie LPN

## 2024-03-10 NOTE — Telephone Encounter (Signed)
 Morning Emily Wagner,  This lady was supposed to go and get a Digoxin level checked last week. She wanted to do this at her PCP's office. However, it does not look like it has been done. Can you please call patient/ daughter and follow-up on this? Please remind her that should not take her Digoxin on the day she has this done until after the lab has been drawn.   I am cc' triage on this just in case Emily Wagner is off today.   Thank you! Teighan Aubert

## 2024-03-11 ENCOUNTER — Other Ambulatory Visit

## 2024-03-11 DIAGNOSIS — Z79899 Other long term (current) drug therapy: Secondary | ICD-10-CM | POA: Diagnosis not present

## 2024-03-11 DIAGNOSIS — I4891 Unspecified atrial fibrillation: Secondary | ICD-10-CM | POA: Diagnosis not present

## 2024-03-12 ENCOUNTER — Other Ambulatory Visit: Payer: Self-pay

## 2024-03-12 DIAGNOSIS — Z79899 Other long term (current) drug therapy: Secondary | ICD-10-CM

## 2024-03-12 LAB — DIGOXIN LEVEL: Digoxin, Serum: 2 ng/mL — ABNORMAL HIGH (ref 0.5–0.9)

## 2024-03-12 NOTE — Telephone Encounter (Signed)
 Per Sharren Decree, PA-C: Sorry, I forgot to put this in the result note. Can you just remind her that she should not take the Digoxin on the morning she goes in for repeat labs until after she has had the lab draw? I explained this to her last time but she may need a reminder. Pt/daughter notified.

## 2024-03-13 DIAGNOSIS — Z515 Encounter for palliative care: Secondary | ICD-10-CM | POA: Diagnosis not present

## 2024-03-18 DIAGNOSIS — I4891 Unspecified atrial fibrillation: Secondary | ICD-10-CM | POA: Diagnosis not present

## 2024-03-19 ENCOUNTER — Other Ambulatory Visit

## 2024-03-19 DIAGNOSIS — Z79899 Other long term (current) drug therapy: Secondary | ICD-10-CM | POA: Diagnosis not present

## 2024-03-19 NOTE — Telephone Encounter (Signed)
 Error

## 2024-03-20 LAB — DIGOXIN LEVEL: Digoxin, Serum: 1 ng/mL — ABNORMAL HIGH (ref 0.5–0.9)

## 2024-03-20 NOTE — Telephone Encounter (Signed)
 error

## 2024-03-25 DIAGNOSIS — I4891 Unspecified atrial fibrillation: Secondary | ICD-10-CM | POA: Diagnosis not present

## 2024-03-26 DIAGNOSIS — I4891 Unspecified atrial fibrillation: Secondary | ICD-10-CM | POA: Diagnosis not present

## 2024-03-31 ENCOUNTER — Telehealth: Payer: Self-pay | Admitting: Cardiology

## 2024-03-31 NOTE — Telephone Encounter (Signed)
 Called and spoke to pt's daughter. She states patient's meds were "tweaked" and she has episodes when she feels SOB and this scares her; the episodes have NOT become more frequent, and daughter feels as if pt is doing better. Asked for a sooner appointment but ONLY at the [facility] office, with Dr. Lavonne Prairie. Pt was placed on the wait list for sooner appt. Currently scheduled to see Dr. Lavonne Prairie on 04/16/2024.

## 2024-03-31 NOTE — Telephone Encounter (Signed)
 Patient c/o Palpitations:  STAT if patient reporting lightheadedness, shortness of breath, or chest pain  How long have you had palpitations/irregular HR/ Afib? Are you having the symptoms now? Afib, Not currently, on/off  Are you currently experiencing lightheadedness, SOB or CP? SOB  Do you have a history of afib (atrial fibrillation) or irregular heart rhythm? Yes Afib  Have you checked your BP or HR? (document readings if available): HR 96 108/87, 158/87  Are you experiencing any other symptoms? No

## 2024-04-01 DIAGNOSIS — I5043 Acute on chronic combined systolic (congestive) and diastolic (congestive) heart failure: Secondary | ICD-10-CM | POA: Diagnosis not present

## 2024-04-01 DIAGNOSIS — E039 Hypothyroidism, unspecified: Secondary | ICD-10-CM | POA: Diagnosis not present

## 2024-04-01 DIAGNOSIS — Z7901 Long term (current) use of anticoagulants: Secondary | ICD-10-CM | POA: Diagnosis not present

## 2024-04-01 DIAGNOSIS — Z79899 Other long term (current) drug therapy: Secondary | ICD-10-CM | POA: Diagnosis not present

## 2024-04-01 DIAGNOSIS — Z556 Problems related to health literacy: Secondary | ICD-10-CM | POA: Diagnosis not present

## 2024-04-01 DIAGNOSIS — I480 Paroxysmal atrial fibrillation: Secondary | ICD-10-CM | POA: Diagnosis not present

## 2024-04-01 DIAGNOSIS — D649 Anemia, unspecified: Secondary | ICD-10-CM | POA: Diagnosis not present

## 2024-04-01 DIAGNOSIS — I700 Atherosclerosis of aorta: Secondary | ICD-10-CM | POA: Diagnosis not present

## 2024-04-01 DIAGNOSIS — I447 Left bundle-branch block, unspecified: Secondary | ICD-10-CM | POA: Diagnosis not present

## 2024-04-01 DIAGNOSIS — S32592D Other specified fracture of left pubis, subsequent encounter for fracture with routine healing: Secondary | ICD-10-CM | POA: Diagnosis not present

## 2024-04-01 DIAGNOSIS — N179 Acute kidney failure, unspecified: Secondary | ICD-10-CM | POA: Diagnosis not present

## 2024-04-01 DIAGNOSIS — Z8673 Personal history of transient ischemic attack (TIA), and cerebral infarction without residual deficits: Secondary | ICD-10-CM | POA: Diagnosis not present

## 2024-04-01 DIAGNOSIS — Z951 Presence of aortocoronary bypass graft: Secondary | ICD-10-CM | POA: Diagnosis not present

## 2024-04-01 DIAGNOSIS — I2510 Atherosclerotic heart disease of native coronary artery without angina pectoris: Secondary | ICD-10-CM | POA: Diagnosis not present

## 2024-04-01 DIAGNOSIS — M8580 Other specified disorders of bone density and structure, unspecified site: Secondary | ICD-10-CM | POA: Diagnosis not present

## 2024-04-01 DIAGNOSIS — Z9181 History of falling: Secondary | ICD-10-CM | POA: Diagnosis not present

## 2024-04-01 DIAGNOSIS — I110 Hypertensive heart disease with heart failure: Secondary | ICD-10-CM | POA: Diagnosis not present

## 2024-04-01 DIAGNOSIS — E7800 Pure hypercholesterolemia, unspecified: Secondary | ICD-10-CM | POA: Diagnosis not present

## 2024-04-01 DIAGNOSIS — N390 Urinary tract infection, site not specified: Secondary | ICD-10-CM | POA: Diagnosis not present

## 2024-04-03 ENCOUNTER — Ambulatory Visit: Payer: Medicare Other | Admitting: Adult Health

## 2024-04-07 DIAGNOSIS — N179 Acute kidney failure, unspecified: Secondary | ICD-10-CM | POA: Diagnosis not present

## 2024-04-07 DIAGNOSIS — I447 Left bundle-branch block, unspecified: Secondary | ICD-10-CM | POA: Diagnosis not present

## 2024-04-07 DIAGNOSIS — I2510 Atherosclerotic heart disease of native coronary artery without angina pectoris: Secondary | ICD-10-CM | POA: Diagnosis not present

## 2024-04-07 DIAGNOSIS — S32592D Other specified fracture of left pubis, subsequent encounter for fracture with routine healing: Secondary | ICD-10-CM | POA: Diagnosis not present

## 2024-04-07 DIAGNOSIS — Z556 Problems related to health literacy: Secondary | ICD-10-CM | POA: Diagnosis not present

## 2024-04-07 DIAGNOSIS — Z79899 Other long term (current) drug therapy: Secondary | ICD-10-CM | POA: Diagnosis not present

## 2024-04-07 DIAGNOSIS — E7800 Pure hypercholesterolemia, unspecified: Secondary | ICD-10-CM | POA: Diagnosis not present

## 2024-04-07 DIAGNOSIS — Z9181 History of falling: Secondary | ICD-10-CM | POA: Diagnosis not present

## 2024-04-07 DIAGNOSIS — D649 Anemia, unspecified: Secondary | ICD-10-CM | POA: Diagnosis not present

## 2024-04-07 DIAGNOSIS — I700 Atherosclerosis of aorta: Secondary | ICD-10-CM | POA: Diagnosis not present

## 2024-04-07 DIAGNOSIS — N390 Urinary tract infection, site not specified: Secondary | ICD-10-CM | POA: Diagnosis not present

## 2024-04-07 DIAGNOSIS — Z8673 Personal history of transient ischemic attack (TIA), and cerebral infarction without residual deficits: Secondary | ICD-10-CM | POA: Diagnosis not present

## 2024-04-07 DIAGNOSIS — M8580 Other specified disorders of bone density and structure, unspecified site: Secondary | ICD-10-CM | POA: Diagnosis not present

## 2024-04-07 DIAGNOSIS — Z951 Presence of aortocoronary bypass graft: Secondary | ICD-10-CM | POA: Diagnosis not present

## 2024-04-07 DIAGNOSIS — Z7901 Long term (current) use of anticoagulants: Secondary | ICD-10-CM | POA: Diagnosis not present

## 2024-04-07 DIAGNOSIS — I480 Paroxysmal atrial fibrillation: Secondary | ICD-10-CM | POA: Diagnosis not present

## 2024-04-07 DIAGNOSIS — I110 Hypertensive heart disease with heart failure: Secondary | ICD-10-CM | POA: Diagnosis not present

## 2024-04-07 DIAGNOSIS — I5043 Acute on chronic combined systolic (congestive) and diastolic (congestive) heart failure: Secondary | ICD-10-CM | POA: Diagnosis not present

## 2024-04-07 DIAGNOSIS — E039 Hypothyroidism, unspecified: Secondary | ICD-10-CM | POA: Diagnosis not present

## 2024-04-08 ENCOUNTER — Encounter: Payer: Self-pay | Admitting: Family Medicine

## 2024-04-08 DIAGNOSIS — S32592D Other specified fracture of left pubis, subsequent encounter for fracture with routine healing: Secondary | ICD-10-CM | POA: Diagnosis not present

## 2024-04-08 DIAGNOSIS — Z8673 Personal history of transient ischemic attack (TIA), and cerebral infarction without residual deficits: Secondary | ICD-10-CM | POA: Diagnosis not present

## 2024-04-08 DIAGNOSIS — I700 Atherosclerosis of aorta: Secondary | ICD-10-CM | POA: Diagnosis not present

## 2024-04-08 DIAGNOSIS — E7800 Pure hypercholesterolemia, unspecified: Secondary | ICD-10-CM | POA: Diagnosis not present

## 2024-04-08 DIAGNOSIS — Z9181 History of falling: Secondary | ICD-10-CM | POA: Diagnosis not present

## 2024-04-08 DIAGNOSIS — I2510 Atherosclerotic heart disease of native coronary artery without angina pectoris: Secondary | ICD-10-CM | POA: Diagnosis not present

## 2024-04-08 DIAGNOSIS — I480 Paroxysmal atrial fibrillation: Secondary | ICD-10-CM | POA: Diagnosis not present

## 2024-04-08 DIAGNOSIS — Z7901 Long term (current) use of anticoagulants: Secondary | ICD-10-CM | POA: Diagnosis not present

## 2024-04-08 DIAGNOSIS — D649 Anemia, unspecified: Secondary | ICD-10-CM | POA: Diagnosis not present

## 2024-04-08 DIAGNOSIS — Z556 Problems related to health literacy: Secondary | ICD-10-CM | POA: Diagnosis not present

## 2024-04-08 DIAGNOSIS — M8580 Other specified disorders of bone density and structure, unspecified site: Secondary | ICD-10-CM | POA: Diagnosis not present

## 2024-04-08 DIAGNOSIS — E039 Hypothyroidism, unspecified: Secondary | ICD-10-CM | POA: Diagnosis not present

## 2024-04-08 DIAGNOSIS — N179 Acute kidney failure, unspecified: Secondary | ICD-10-CM | POA: Diagnosis not present

## 2024-04-08 DIAGNOSIS — I447 Left bundle-branch block, unspecified: Secondary | ICD-10-CM | POA: Diagnosis not present

## 2024-04-08 DIAGNOSIS — I5043 Acute on chronic combined systolic (congestive) and diastolic (congestive) heart failure: Secondary | ICD-10-CM | POA: Diagnosis not present

## 2024-04-08 DIAGNOSIS — I110 Hypertensive heart disease with heart failure: Secondary | ICD-10-CM | POA: Diagnosis not present

## 2024-04-08 DIAGNOSIS — N390 Urinary tract infection, site not specified: Secondary | ICD-10-CM | POA: Diagnosis not present

## 2024-04-08 DIAGNOSIS — Z79899 Other long term (current) drug therapy: Secondary | ICD-10-CM | POA: Diagnosis not present

## 2024-04-08 DIAGNOSIS — Z951 Presence of aortocoronary bypass graft: Secondary | ICD-10-CM | POA: Diagnosis not present

## 2024-04-09 ENCOUNTER — Ambulatory Visit: Payer: Self-pay | Admitting: Family Medicine

## 2024-04-10 ENCOUNTER — Telehealth: Payer: Self-pay | Admitting: Pharmacy Technician

## 2024-04-10 NOTE — Telephone Encounter (Signed)
   Spoke to [facility] and told them she is outpatient

## 2024-04-15 DIAGNOSIS — I447 Left bundle-branch block, unspecified: Secondary | ICD-10-CM | POA: Diagnosis not present

## 2024-04-15 DIAGNOSIS — E039 Hypothyroidism, unspecified: Secondary | ICD-10-CM | POA: Diagnosis not present

## 2024-04-15 DIAGNOSIS — Z79899 Other long term (current) drug therapy: Secondary | ICD-10-CM | POA: Diagnosis not present

## 2024-04-15 DIAGNOSIS — Z951 Presence of aortocoronary bypass graft: Secondary | ICD-10-CM | POA: Diagnosis not present

## 2024-04-15 DIAGNOSIS — N179 Acute kidney failure, unspecified: Secondary | ICD-10-CM | POA: Diagnosis not present

## 2024-04-15 DIAGNOSIS — E7800 Pure hypercholesterolemia, unspecified: Secondary | ICD-10-CM | POA: Diagnosis not present

## 2024-04-15 DIAGNOSIS — I5043 Acute on chronic combined systolic (congestive) and diastolic (congestive) heart failure: Secondary | ICD-10-CM | POA: Diagnosis not present

## 2024-04-15 DIAGNOSIS — S32592D Other specified fracture of left pubis, subsequent encounter for fracture with routine healing: Secondary | ICD-10-CM | POA: Diagnosis not present

## 2024-04-15 DIAGNOSIS — N390 Urinary tract infection, site not specified: Secondary | ICD-10-CM | POA: Diagnosis not present

## 2024-04-15 DIAGNOSIS — I2510 Atherosclerotic heart disease of native coronary artery without angina pectoris: Secondary | ICD-10-CM | POA: Diagnosis not present

## 2024-04-15 DIAGNOSIS — Z9181 History of falling: Secondary | ICD-10-CM | POA: Diagnosis not present

## 2024-04-15 DIAGNOSIS — D649 Anemia, unspecified: Secondary | ICD-10-CM | POA: Diagnosis not present

## 2024-04-15 DIAGNOSIS — I480 Paroxysmal atrial fibrillation: Secondary | ICD-10-CM | POA: Diagnosis not present

## 2024-04-15 DIAGNOSIS — Z7901 Long term (current) use of anticoagulants: Secondary | ICD-10-CM | POA: Diagnosis not present

## 2024-04-15 DIAGNOSIS — I700 Atherosclerosis of aorta: Secondary | ICD-10-CM | POA: Diagnosis not present

## 2024-04-15 DIAGNOSIS — Z556 Problems related to health literacy: Secondary | ICD-10-CM | POA: Diagnosis not present

## 2024-04-15 DIAGNOSIS — M8580 Other specified disorders of bone density and structure, unspecified site: Secondary | ICD-10-CM | POA: Diagnosis not present

## 2024-04-15 DIAGNOSIS — I110 Hypertensive heart disease with heart failure: Secondary | ICD-10-CM | POA: Diagnosis not present

## 2024-04-15 DIAGNOSIS — Z8673 Personal history of transient ischemic attack (TIA), and cerebral infarction without residual deficits: Secondary | ICD-10-CM | POA: Diagnosis not present

## 2024-04-16 ENCOUNTER — Ambulatory Visit (INDEPENDENT_AMBULATORY_CARE_PROVIDER_SITE_OTHER)

## 2024-04-16 ENCOUNTER — Ambulatory Visit: Payer: Medicare Other | Admitting: Cardiology

## 2024-04-16 DIAGNOSIS — F0284 Dementia in other diseases classified elsewhere, unspecified severity, with anxiety: Secondary | ICD-10-CM

## 2024-04-16 DIAGNOSIS — I5043 Acute on chronic combined systolic (congestive) and diastolic (congestive) heart failure: Secondary | ICD-10-CM

## 2024-04-16 DIAGNOSIS — F4323 Adjustment disorder with mixed anxiety and depressed mood: Secondary | ICD-10-CM

## 2024-04-16 DIAGNOSIS — N179 Acute kidney failure, unspecified: Secondary | ICD-10-CM | POA: Diagnosis not present

## 2024-04-16 DIAGNOSIS — F0283 Dementia in other diseases classified elsewhere, unspecified severity, with mood disturbance: Secondary | ICD-10-CM

## 2024-04-16 DIAGNOSIS — D649 Anemia, unspecified: Secondary | ICD-10-CM

## 2024-04-16 DIAGNOSIS — I700 Atherosclerosis of aorta: Secondary | ICD-10-CM

## 2024-04-16 DIAGNOSIS — I480 Paroxysmal atrial fibrillation: Secondary | ICD-10-CM | POA: Diagnosis not present

## 2024-04-16 DIAGNOSIS — I110 Hypertensive heart disease with heart failure: Secondary | ICD-10-CM | POA: Diagnosis not present

## 2024-04-16 DIAGNOSIS — E039 Hypothyroidism, unspecified: Secondary | ICD-10-CM

## 2024-04-16 DIAGNOSIS — I447 Left bundle-branch block, unspecified: Secondary | ICD-10-CM

## 2024-04-16 DIAGNOSIS — I2510 Atherosclerotic heart disease of native coronary artery without angina pectoris: Secondary | ICD-10-CM

## 2024-04-22 ENCOUNTER — Ambulatory Visit: Payer: Medicare Other | Admitting: Adult Health

## 2024-04-22 ENCOUNTER — Telehealth: Payer: Self-pay | Admitting: Adult Health

## 2024-04-22 NOTE — Telephone Encounter (Signed)
 [facility] cancellation

## 2024-04-23 DIAGNOSIS — M8580 Other specified disorders of bone density and structure, unspecified site: Secondary | ICD-10-CM | POA: Diagnosis not present

## 2024-04-23 DIAGNOSIS — Z556 Problems related to health literacy: Secondary | ICD-10-CM | POA: Diagnosis not present

## 2024-04-23 DIAGNOSIS — N179 Acute kidney failure, unspecified: Secondary | ICD-10-CM | POA: Diagnosis not present

## 2024-04-23 DIAGNOSIS — Z9181 History of falling: Secondary | ICD-10-CM | POA: Diagnosis not present

## 2024-04-23 DIAGNOSIS — I2510 Atherosclerotic heart disease of native coronary artery without angina pectoris: Secondary | ICD-10-CM | POA: Diagnosis not present

## 2024-04-23 DIAGNOSIS — E7800 Pure hypercholesterolemia, unspecified: Secondary | ICD-10-CM | POA: Diagnosis not present

## 2024-04-23 DIAGNOSIS — Z8673 Personal history of transient ischemic attack (TIA), and cerebral infarction without residual deficits: Secondary | ICD-10-CM | POA: Diagnosis not present

## 2024-04-23 DIAGNOSIS — Z7901 Long term (current) use of anticoagulants: Secondary | ICD-10-CM | POA: Diagnosis not present

## 2024-04-23 DIAGNOSIS — I110 Hypertensive heart disease with heart failure: Secondary | ICD-10-CM | POA: Diagnosis not present

## 2024-04-23 DIAGNOSIS — E039 Hypothyroidism, unspecified: Secondary | ICD-10-CM | POA: Diagnosis not present

## 2024-04-23 DIAGNOSIS — N390 Urinary tract infection, site not specified: Secondary | ICD-10-CM | POA: Diagnosis not present

## 2024-04-23 DIAGNOSIS — I447 Left bundle-branch block, unspecified: Secondary | ICD-10-CM | POA: Diagnosis not present

## 2024-04-23 DIAGNOSIS — D649 Anemia, unspecified: Secondary | ICD-10-CM | POA: Diagnosis not present

## 2024-04-23 DIAGNOSIS — I480 Paroxysmal atrial fibrillation: Secondary | ICD-10-CM | POA: Diagnosis not present

## 2024-04-23 DIAGNOSIS — I700 Atherosclerosis of aorta: Secondary | ICD-10-CM | POA: Diagnosis not present

## 2024-04-23 DIAGNOSIS — I5043 Acute on chronic combined systolic (congestive) and diastolic (congestive) heart failure: Secondary | ICD-10-CM | POA: Diagnosis not present

## 2024-04-23 DIAGNOSIS — Z951 Presence of aortocoronary bypass graft: Secondary | ICD-10-CM | POA: Diagnosis not present

## 2024-04-23 DIAGNOSIS — Z79899 Other long term (current) drug therapy: Secondary | ICD-10-CM | POA: Diagnosis not present

## 2024-04-23 DIAGNOSIS — S32592D Other specified fracture of left pubis, subsequent encounter for fracture with routine healing: Secondary | ICD-10-CM | POA: Diagnosis not present

## 2024-04-24 DIAGNOSIS — M79674 Pain in right toe(s): Secondary | ICD-10-CM | POA: Diagnosis not present

## 2024-04-24 DIAGNOSIS — B351 Tinea unguium: Secondary | ICD-10-CM | POA: Diagnosis not present

## 2024-04-24 DIAGNOSIS — M79675 Pain in left toe(s): Secondary | ICD-10-CM | POA: Diagnosis not present

## 2024-04-29 DIAGNOSIS — N179 Acute kidney failure, unspecified: Secondary | ICD-10-CM | POA: Diagnosis not present

## 2024-04-29 DIAGNOSIS — N390 Urinary tract infection, site not specified: Secondary | ICD-10-CM | POA: Diagnosis not present

## 2024-04-29 DIAGNOSIS — E039 Hypothyroidism, unspecified: Secondary | ICD-10-CM | POA: Diagnosis not present

## 2024-04-29 DIAGNOSIS — I2510 Atherosclerotic heart disease of native coronary artery without angina pectoris: Secondary | ICD-10-CM | POA: Diagnosis not present

## 2024-04-29 DIAGNOSIS — S32592D Other specified fracture of left pubis, subsequent encounter for fracture with routine healing: Secondary | ICD-10-CM | POA: Diagnosis not present

## 2024-04-29 DIAGNOSIS — Z9181 History of falling: Secondary | ICD-10-CM | POA: Diagnosis not present

## 2024-04-29 DIAGNOSIS — E7800 Pure hypercholesterolemia, unspecified: Secondary | ICD-10-CM | POA: Diagnosis not present

## 2024-04-29 DIAGNOSIS — Z8673 Personal history of transient ischemic attack (TIA), and cerebral infarction without residual deficits: Secondary | ICD-10-CM | POA: Diagnosis not present

## 2024-04-29 DIAGNOSIS — Z556 Problems related to health literacy: Secondary | ICD-10-CM | POA: Diagnosis not present

## 2024-04-29 DIAGNOSIS — Z951 Presence of aortocoronary bypass graft: Secondary | ICD-10-CM | POA: Diagnosis not present

## 2024-04-29 DIAGNOSIS — I700 Atherosclerosis of aorta: Secondary | ICD-10-CM | POA: Diagnosis not present

## 2024-04-29 DIAGNOSIS — Z7901 Long term (current) use of anticoagulants: Secondary | ICD-10-CM | POA: Diagnosis not present

## 2024-04-29 DIAGNOSIS — I480 Paroxysmal atrial fibrillation: Secondary | ICD-10-CM | POA: Diagnosis not present

## 2024-04-29 DIAGNOSIS — I5043 Acute on chronic combined systolic (congestive) and diastolic (congestive) heart failure: Secondary | ICD-10-CM | POA: Diagnosis not present

## 2024-04-29 DIAGNOSIS — M8580 Other specified disorders of bone density and structure, unspecified site: Secondary | ICD-10-CM | POA: Diagnosis not present

## 2024-04-29 DIAGNOSIS — I447 Left bundle-branch block, unspecified: Secondary | ICD-10-CM | POA: Diagnosis not present

## 2024-04-29 DIAGNOSIS — Z79899 Other long term (current) drug therapy: Secondary | ICD-10-CM | POA: Diagnosis not present

## 2024-04-29 DIAGNOSIS — I110 Hypertensive heart disease with heart failure: Secondary | ICD-10-CM | POA: Diagnosis not present

## 2024-04-29 DIAGNOSIS — D649 Anemia, unspecified: Secondary | ICD-10-CM | POA: Diagnosis not present

## 2024-04-30 DIAGNOSIS — Z515 Encounter for palliative care: Secondary | ICD-10-CM | POA: Diagnosis not present

## 2024-05-02 DIAGNOSIS — M25532 Pain in left wrist: Secondary | ICD-10-CM | POA: Diagnosis not present

## 2024-05-08 DIAGNOSIS — I2510 Atherosclerotic heart disease of native coronary artery without angina pectoris: Secondary | ICD-10-CM | POA: Diagnosis not present

## 2024-05-08 DIAGNOSIS — N390 Urinary tract infection, site not specified: Secondary | ICD-10-CM | POA: Diagnosis not present

## 2024-05-08 DIAGNOSIS — S32592D Other specified fracture of left pubis, subsequent encounter for fracture with routine healing: Secondary | ICD-10-CM | POA: Diagnosis not present

## 2024-05-08 DIAGNOSIS — I700 Atherosclerosis of aorta: Secondary | ICD-10-CM | POA: Diagnosis not present

## 2024-05-08 DIAGNOSIS — Z951 Presence of aortocoronary bypass graft: Secondary | ICD-10-CM | POA: Diagnosis not present

## 2024-05-08 DIAGNOSIS — I447 Left bundle-branch block, unspecified: Secondary | ICD-10-CM | POA: Diagnosis not present

## 2024-05-08 DIAGNOSIS — I5043 Acute on chronic combined systolic (congestive) and diastolic (congestive) heart failure: Secondary | ICD-10-CM | POA: Diagnosis not present

## 2024-05-08 DIAGNOSIS — Z8673 Personal history of transient ischemic attack (TIA), and cerebral infarction without residual deficits: Secondary | ICD-10-CM | POA: Diagnosis not present

## 2024-05-08 DIAGNOSIS — Z7901 Long term (current) use of anticoagulants: Secondary | ICD-10-CM | POA: Diagnosis not present

## 2024-05-08 DIAGNOSIS — D649 Anemia, unspecified: Secondary | ICD-10-CM | POA: Diagnosis not present

## 2024-05-08 DIAGNOSIS — M8580 Other specified disorders of bone density and structure, unspecified site: Secondary | ICD-10-CM | POA: Diagnosis not present

## 2024-05-08 DIAGNOSIS — E7800 Pure hypercholesterolemia, unspecified: Secondary | ICD-10-CM | POA: Diagnosis not present

## 2024-05-08 DIAGNOSIS — I110 Hypertensive heart disease with heart failure: Secondary | ICD-10-CM | POA: Diagnosis not present

## 2024-05-08 DIAGNOSIS — Z9181 History of falling: Secondary | ICD-10-CM | POA: Diagnosis not present

## 2024-05-08 DIAGNOSIS — E039 Hypothyroidism, unspecified: Secondary | ICD-10-CM | POA: Diagnosis not present

## 2024-05-08 DIAGNOSIS — Z556 Problems related to health literacy: Secondary | ICD-10-CM | POA: Diagnosis not present

## 2024-05-08 DIAGNOSIS — N179 Acute kidney failure, unspecified: Secondary | ICD-10-CM | POA: Diagnosis not present

## 2024-05-08 DIAGNOSIS — I480 Paroxysmal atrial fibrillation: Secondary | ICD-10-CM | POA: Diagnosis not present

## 2024-05-08 DIAGNOSIS — Z79899 Other long term (current) drug therapy: Secondary | ICD-10-CM | POA: Diagnosis not present

## 2024-05-09 ENCOUNTER — Telehealth: Payer: Self-pay | Admitting: Family Medicine

## 2024-05-09 NOTE — Telephone Encounter (Signed)
 Copied from CRM [id number]. Topic: General - Other >> May 09, 2024 11:47 AM Carlatta H wrote: Reason for CRM: Patient would like a call back from someone in the office//Would not give a reason

## 2024-05-09 NOTE — Telephone Encounter (Signed)
 Called and spoke with pt , she just needed to make a apt

## 2024-05-14 DIAGNOSIS — I480 Paroxysmal atrial fibrillation: Secondary | ICD-10-CM | POA: Diagnosis not present

## 2024-05-14 DIAGNOSIS — D649 Anemia, unspecified: Secondary | ICD-10-CM | POA: Diagnosis not present

## 2024-05-14 DIAGNOSIS — I5043 Acute on chronic combined systolic (congestive) and diastolic (congestive) heart failure: Secondary | ICD-10-CM | POA: Diagnosis not present

## 2024-05-14 DIAGNOSIS — Z556 Problems related to health literacy: Secondary | ICD-10-CM | POA: Diagnosis not present

## 2024-05-14 DIAGNOSIS — M8580 Other specified disorders of bone density and structure, unspecified site: Secondary | ICD-10-CM | POA: Diagnosis not present

## 2024-05-14 DIAGNOSIS — I110 Hypertensive heart disease with heart failure: Secondary | ICD-10-CM | POA: Diagnosis not present

## 2024-05-14 DIAGNOSIS — S32592D Other specified fracture of left pubis, subsequent encounter for fracture with routine healing: Secondary | ICD-10-CM | POA: Diagnosis not present

## 2024-05-14 DIAGNOSIS — Z7901 Long term (current) use of anticoagulants: Secondary | ICD-10-CM | POA: Diagnosis not present

## 2024-05-14 DIAGNOSIS — I2510 Atherosclerotic heart disease of native coronary artery without angina pectoris: Secondary | ICD-10-CM | POA: Diagnosis not present

## 2024-05-14 DIAGNOSIS — E7800 Pure hypercholesterolemia, unspecified: Secondary | ICD-10-CM | POA: Diagnosis not present

## 2024-05-14 DIAGNOSIS — Z9181 History of falling: Secondary | ICD-10-CM | POA: Diagnosis not present

## 2024-05-14 DIAGNOSIS — Z79899 Other long term (current) drug therapy: Secondary | ICD-10-CM | POA: Diagnosis not present

## 2024-05-14 DIAGNOSIS — Z951 Presence of aortocoronary bypass graft: Secondary | ICD-10-CM | POA: Diagnosis not present

## 2024-05-14 DIAGNOSIS — Z8673 Personal history of transient ischemic attack (TIA), and cerebral infarction without residual deficits: Secondary | ICD-10-CM | POA: Diagnosis not present

## 2024-05-14 DIAGNOSIS — I700 Atherosclerosis of aorta: Secondary | ICD-10-CM | POA: Diagnosis not present

## 2024-05-14 DIAGNOSIS — N390 Urinary tract infection, site not specified: Secondary | ICD-10-CM | POA: Diagnosis not present

## 2024-05-14 DIAGNOSIS — N179 Acute kidney failure, unspecified: Secondary | ICD-10-CM | POA: Diagnosis not present

## 2024-05-14 DIAGNOSIS — I447 Left bundle-branch block, unspecified: Secondary | ICD-10-CM | POA: Diagnosis not present

## 2024-05-14 DIAGNOSIS — E039 Hypothyroidism, unspecified: Secondary | ICD-10-CM | POA: Diagnosis not present

## 2024-05-15 ENCOUNTER — Encounter: Payer: Self-pay | Admitting: Family Medicine

## 2024-05-15 ENCOUNTER — Ambulatory Visit (INDEPENDENT_AMBULATORY_CARE_PROVIDER_SITE_OTHER): Admitting: Family Medicine

## 2024-05-15 VITALS — BP 151/70 | HR 63 | Ht 64.0 in | Wt 109.0 lb

## 2024-05-15 DIAGNOSIS — R0609 Other forms of dyspnea: Secondary | ICD-10-CM

## 2024-05-15 DIAGNOSIS — I4819 Other persistent atrial fibrillation: Secondary | ICD-10-CM

## 2024-05-15 DIAGNOSIS — I5032 Chronic diastolic (congestive) heart failure: Secondary | ICD-10-CM

## 2024-05-15 NOTE — Progress Notes (Signed)
 Cardiology Office Note:   Date:  05/16/2024  ID:  Emily Wagner, DOB Aug 18, 1933, MRN [id number] PCP: [name], Fonda LABOR, MD  [facility] Providers Cardiologist:  Lynwood Schilling, MD {  History of Present Illness:   Emily Wagner is a 89 y.o. female  who presents for evaluation of atrial fib.     She has a history of  bypass surgery.  She has had fluctuating BPs.  She has a diagnosis of post polio syndrome.   She has chronic fatigue.   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  She was hospitalized May of last year for this.     She was admitted from 02/17/2024 to 02/20/2024 for atrial fibrillation with RVR after presenting with increased fatigue and weakness. Rates were initially in the 150s. Echo showed LVEF of 40-45% with global hypokinesis and LV dyssynchrony due to LBBB, mildly RV function, severe left atrial enlargement, probable moderate low-gradient low-flow aortic stenosis, moderate to severe mitral regurgitation, and mild to moderate tricuspid regurgitation. She was initially was started on IV Cardizem  with improvement in rates but this was stopped due to reduced EF. She was was started on Toprol -XL and continued on home Digoxin . High-sensitivity troponin was minimally elevated in the 30s to 40s which was felt to be secondary to demand ischemia. BNP was elevated in the 800s and chest x-ray showed small bilateral pleural effusion so he was given one dose of IV Lasix  on admission but had worsening renal function with this so was not given any more diuretics.   She returns for follow up.  She complains of shortness of breath with mild activities.  Her daughter is staying with her most of the time.  She is actually in palliative care but not in home.  She is very frail.  She has significantly decreased memory.  They are not describing PND or orthopnea.  They are not describing palpitations, presyncope or syncope.  She is very unsteady on her feet and fall risk.  She was never  prescribed oxygen.  She is not currently taking any diuretic.  There is no increased swelling.  She did have blood work yesterday  ROS: As stated in the HPI and negative for all other systems.  Studies Reviewed:    EKG:     Atrial fibrillation, rate 49, right axis deviation, left ventricular hypertrophy, nonspecific ST-T wave changes.   Risk Assessment/Calculations:    CHA2DS2-VASc Score = 6   This indicates a 9.7% annual risk of stroke. The patient's score is based upon: CHF History: 1 HTN History: 1 Diabetes History: 0 Stroke History: 0 Vascular Disease History: 1 Age Score: 2 Gender Score: 1   Physical Exam:   VS:  BP 132/82   Pulse 79   Ht 5' 4 (1.626 m)   Wt 109 lb (49.4 kg)   LMP 03/21/1971   SpO2 95%   BMI 18.71 kg/m    Wt Readings from Last 3 Encounters:  05/16/24 109 lb (49.4 kg)  05/15/24 109 lb (49.4 kg)  03/04/24 110 lb 6.4 oz (50.1 kg)     GEN: Well nourished, well developed in no acute distress NECK: No JVD; No carotid bruits CARDIAC: Irregular RR, no murmurs, rubs, gallops RESPIRATORY:  Clear to auscultation without rales, wheezing or rhonchi  ABDOMEN: Soft, non-tender, non-distended EXTREMITIES:  No edema; No deformity   ASSESSMENT AND PLAN:   CAD: The patient has no new sypmtoms.  No further cardiovascular testing is indicated.  We will continue with aggressive risk reduction and meds as listed.  She needs conservative management.   ATRIAL FIB:    She is perseverating on digoxin  is being toxic and having led to her sister's death.  I think it is reasonable to stop this.  She tolerates the low-dose anticoagulation that is the appropriate dose for her.  She seems to have good rate control on beta-blocker which has been started and diltiazem  discontinued.  I will check a BMET in two weeks in [place].   SOB: I am going to try a low-dose of Lasix  10 mg daily to see if this helps with her breathing.  We did ambulate her in the office.  She is very frail  and difficult to ambulate.  Her oxygen saturation was 91% she does not qualify for O2.    DYSLIPIDEMIA:   She has not tolerated statins.  No change in therapy.  HTN:  Her BP is has been labile.  No change in therapy other than as above.      Follow up with me or APP in 2 months.   Signed, Lynwood Schilling, MD

## 2024-05-15 NOTE — Progress Notes (Signed)
 Acute Office Visit  Subjective:     Patient ID: Emily Wagner, female    DOB: Jun 04, 1933, 89 y.o.   MRN: [id number]  Chief Complaint  Patient presents with   Shortness of Breath    With walking    HPI Patient with is in today for shortness of breath with exertion, ongoing for three months after she was diagnosed with heart failure with an EF estimated at 40-45%. She reports that she becomes short of breath with any physical activity like walking around or going to car and has to often stop to rest and collect her breath. The episodes last for less than five minutes and resolve spontaneously with rest. She does not recall any other aggravating factor other than physical activity. She has not tried anything else to alleviate the symptoms. She denies chest pain, chest tightness, orthopnea, PND, palpitations, syncope, edema, weight change, cough, wheezing, fever, chills, URI exposure/symptoms, blood in stool, or urinary changes. Patient reports that the episodes are mild in severity, but they does affect her ability to complete daily activities. Patient also wants to be checked for kidney function due to recently starting digoxin  for heart failure.   Review of Systems  Constitutional:  Negative for chills, fever and weight loss.  HENT:  Negative for congestion, ear pain, hearing loss and sore throat.   Eyes:  Negative for blurred vision and double vision.  Respiratory:  Positive for shortness of breath. Negative for cough, sputum production and wheezing.   Cardiovascular:  Negative for chest pain, palpitations, orthopnea and leg swelling.  Gastrointestinal:  Negative for abdominal pain, blood in stool, constipation, diarrhea, heartburn, nausea and vomiting.  Genitourinary:  Negative for dysuria, frequency and urgency.  Musculoskeletal:  Positive for falls. Negative for myalgias and neck pain.  Skin:  Negative for itching and rash.  Neurological:  Negative for dizziness, tingling, tremors,  focal weakness and headaches.  Psychiatric/Behavioral:  Negative for depression.         Objective:    BP (!) 151/70   Pulse 63   Ht 5' 4 (1.626 m)   Wt 109 lb (49.4 kg)   LMP 03/21/1971   SpO2 95%   BMI 18.71 kg/m     Physical Exam Constitutional:      General: She is not in acute distress.    Appearance: She is well-developed and normal weight.  HENT:     Head: Normocephalic and atraumatic.     Mouth/Throat:     Mouth: Mucous membranes are moist.     Pharynx: Oropharynx is clear.   Eyes:     Extraocular Movements: Extraocular movements intact.     Pupils: Pupils are equal, round, and reactive to light.    Cardiovascular:     Rate and Rhythm: Normal rate and regular rhythm.     Pulses: Normal pulses.     Heart sounds: Normal heart sounds. No murmur heard.    No gallop.  Pulmonary:     Effort: Pulmonary effort is normal.     Breath sounds: Normal breath sounds.  Abdominal:     General: Abdomen is flat.     Palpations: Abdomen is soft.   Musculoskeletal:        General: Normal range of motion.     Cervical back: Normal range of motion.   Skin:    General: Skin is warm and dry.     Coloration: Skin is not pale.     Nails: There is no clubbing.  Neurological:     General: No focal deficit present.     Mental Status: She is alert and oriented to person, place, and time.   Psychiatric:        Mood and Affect: Mood normal.     No results found for any visits on 05/15/24.      Assessment & Plan:   Problem List Items Addressed This Visit       Cardiovascular and Mediastinum   Atrial fibrillation (HCC)   Relevant Orders   CMP14+EGFR   Digoxin  level   Chronic diastolic CHF (congestive heart failure) (HCC)   Relevant Orders   CMP14+EGFR   Digoxin  level   Other Visit Diagnoses       Exertional dyspnea    -  Primary   Relevant Orders   CMP14+EGFR   Digoxin  level       (1) SOB with exertion  - Assessment: The symptoms are most likely  caused by HFrEF. Lack of red flag symptoms make other differentials less likely.  - Plan: Check kidney functions. Continue monitoring symptoms. RTC or seek emergency care if symptoms do not improve or worsen.    No orders of the defined types were placed in this encounter.   Return if symptoms worsen or fail to improve.  Hilton Lucky, MD  Patient seen and examined with student, agree with assessment and plan above.  Does not sound like she is in a CHF exacerbation, will manage conservatively.  Will check blood work. Jolyne Needs, MD [facility] 05/15/2024, 1:10 PM

## 2024-05-16 ENCOUNTER — Ambulatory Visit: Attending: Cardiology | Admitting: Cardiology

## 2024-05-16 ENCOUNTER — Encounter: Payer: Self-pay | Admitting: Cardiology

## 2024-05-16 ENCOUNTER — Ambulatory Visit: Payer: Self-pay | Admitting: Family Medicine

## 2024-05-16 VITALS — BP 132/82 | HR 79 | Ht 64.0 in | Wt 109.0 lb

## 2024-05-16 DIAGNOSIS — I2510 Atherosclerotic heart disease of native coronary artery without angina pectoris: Secondary | ICD-10-CM

## 2024-05-16 DIAGNOSIS — I482 Chronic atrial fibrillation, unspecified: Secondary | ICD-10-CM | POA: Diagnosis not present

## 2024-05-16 DIAGNOSIS — I10 Essential (primary) hypertension: Secondary | ICD-10-CM

## 2024-05-16 DIAGNOSIS — E785 Hyperlipidemia, unspecified: Secondary | ICD-10-CM

## 2024-05-16 DIAGNOSIS — I447 Left bundle-branch block, unspecified: Secondary | ICD-10-CM | POA: Diagnosis not present

## 2024-05-16 LAB — CMP14+EGFR
ALT: 9 IU/L (ref 0–32)
AST: 20 IU/L (ref 0–40)
Albumin: 3.8 g/dL (ref 3.7–4.7)
Alkaline Phosphatase: 131 IU/L — ABNORMAL HIGH (ref 44–121)
BUN/Creatinine Ratio: 18 (ref 12–28)
BUN: 21 mg/dL (ref 8–27)
Bilirubin Total: 0.3 mg/dL (ref 0.0–1.2)
CO2: 22 mmol/L (ref 20–29)
Calcium: 9.2 mg/dL (ref 8.7–10.3)
Chloride: 105 mmol/L (ref 96–106)
Creatinine, Ser: 1.18 mg/dL — ABNORMAL HIGH (ref 0.57–1.00)
Globulin, Total: 2.7 g/dL (ref 1.5–4.5)
Glucose: 100 mg/dL — ABNORMAL HIGH (ref 70–99)
Potassium: 4.1 mmol/L (ref 3.5–5.2)
Sodium: 142 mmol/L (ref 134–144)
Total Protein: 6.5 g/dL (ref 6.0–8.5)
eGFR: 44 mL/min/{1.73_m2} — ABNORMAL LOW (ref >59)

## 2024-05-16 LAB — DIGOXIN LEVEL: Digoxin, Serum: 0.7 ng/mL (ref 0.5–0.9)

## 2024-05-16 MED ORDER — FUROSEMIDE 20 MG PO TABS
10.0000 mg | ORAL_TABLET | Freq: Every day | ORAL | 3 refills | Status: AC
Start: 2024-05-16 — End: ?

## 2024-05-16 NOTE — Patient Instructions (Addendum)
 Medication Instructions:  Start Lasix  10mg  once daily Stop Digoxin  *If you need a refill on your cardiac medications before your next appointment, please call your pharmacy*  Lab Work: BMET in 2 weeks, [facility] [place] If you have labs (blood work) drawn today and your tests are completely normal, you will receive your results only by: MyChart Message (if you have MyChart) OR A paper copy in the mail If you have any lab test that is abnormal or we need to change your treatment, we will call you to review the results.  Testing/Procedures: None  Follow-Up: At [facility], you and your health needs are our priority.  As part of our continuing mission to provide you with exceptional heart care, our providers are all part of one team.  This team includes your primary Cardiologist (physician) and Advanced Practice Providers or APPs (Physician Assistants and Nurse Practitioners) who all work together to provide you with the care you need, when you need it.  Your next appointment:   2 month(s)  Provider:   Eilleen Grates, MD    We recommend signing up for the patient portal called MyChart.  Sign up information is provided on this After Visit Summary.  MyChart is used to connect with patients for Virtual Visits (Telemedicine).  Patients are able to view lab/test results, encounter notes, upcoming appointments, etc.  Non-urgent messages can be sent to your provider as well.   To learn more about what you can do with MyChart, go to [email or web address].

## 2024-05-20 ENCOUNTER — Ambulatory Visit: Admitting: Adult Health

## 2024-05-20 ENCOUNTER — Encounter: Payer: Self-pay | Admitting: Adult Health

## 2024-05-20 VITALS — BP 124/69 | HR 73 | Ht 64.0 in | Wt 107.0 lb

## 2024-05-20 DIAGNOSIS — Z8673 Personal history of transient ischemic attack (TIA), and cerebral infarction without residual deficits: Secondary | ICD-10-CM

## 2024-05-20 DIAGNOSIS — R441 Visual hallucinations: Secondary | ICD-10-CM | POA: Diagnosis not present

## 2024-05-20 NOTE — Progress Notes (Signed)
 [facility] [address] [place]. [place] [address] [phone]       OFFICE FOLLOW UP NOTE  Emily Wagner Date of Birth:  May 08, 1933 Medical Record Number:  [id number]     Primary neurologist: Dr. Rosemarie Reason for visit: cognitive impairment, hallucinations, hx of stroke    Chief Complaint  Patient presents with   Cerebrovascular Accident    Rm 8 with daughter Pt is well, reports she has had several falls since last visit.  Daughter reports her memory has declined and she is still having hallucinations.         HPI:   Update 05/20/2024 JM: Patient returns for follow-up visit accompanied by her daughter. At prior visit, discussed visual and auditory hallucinations over the past 4 months.  MR brain showed subacute left PICA infarct as well as multiple remote infarcts. Urinalysis consistent with UTI and had follow-up with PCP who rx'd antibiotics.  ED visit in March for A-fib with RVR, CHF exacerbation and UTI after presenting with increased fatigue and weakness.   Daughter provides majority of history. She reports continued visual and auditory hallucinations although patient denies this occurring. Daughter also mentions continued cognitive decline and agitation.  She is currently being followed by palliative care, denies benefit with increasing Seroquel  dosage therefore resume 50 mg dosing and was started on mirtazapine. Daughter notes some benefit with mirtazapine as far as agitation but still present about 85% of the time.  She is also on paroxetine  20 mg daily.  Daughter mentions frequent falls, she is currently working with [facility] PT. Ambulates short distance with RW, use of w/c long distance. She does become short of breath with little activity. Daughter provides assistance with ADLs and supervision. Patient feels bothered by daughter constantly needing to be with her and watching her, she is frustrated by gradually losing her independence. Patient is  quite irritable during visit, speaks off topic frequently, doesn't understand why she is no longer able to drive.      History provided for reference purposes only Update 10/11/2023 : Patient is seen today after last visit nearly a year and a half ago.  She is referred by primary physician for concerns about increasing hallucinations and cognitive issues.  Patient is accompanied by her daughter.  She has been having for the last 4 months visual and auditory hallucinations.  She sees objects in animals which she knows are not real.  Initially she is to see them and they are outside the house but of late it has been more distressing since she says they occur inside her house.  Demulcent objects change.  She knows they are not real but she gets upset.  She saw primary care physician initially thought it was related to trazodone  and he stopped it and tapered the melatonin as well.  She was thought to have UTI and treated with antibiotics but despite treatment his hallucinations have persisted.  She has been on Seroquel  50 mg at night for the last several months to help her sleep.  She denies any headache, focal extremity weakness or numbness.  She actually did quite well on cognitive testing today and scored 30/30 on Mini-Mental status exam.  She denies any recent strokelike symptoms.  She did have a CT scan of the head on 06/08/2023 which showed no acute abnormality.  Lab work on 07/04/2023 showed LDL-cholesterol to be elevated at 156 mg percent.  Update 03/27/2022 JM: 89 year old female with history of right MCA stroke in 03/2021  who returns today for 37-month follow-up visit. She is accompanied by her daughter, Emily Wagner.  She has been doing well since prior visit without any new or reoccurring stroke/TIA symptoms.  Cognition stable since prior visit.  Has since returned back to driving without difficulty short distance only.  Continues to maintain ADLs and IADLs independently.  Continued use of RW or cane at all times  (baseline with LLE weakness 2/2 polio), no recent falls.  Compliant on Eliquis , denies side effects.  Blood pressure today 122/67. She does c/o LE proximal pain from hx of polio, she questions use of pain medication that is not addictive. She has not previously been seen by pain management as she has multiple medication allergies limiting treatment options. Currently using Voltaren  gel with some benefit. Cold and rainy weather worsens pain.  No new concerns at this time.   Update 09/20/2021 JM: Returns for sooner requested visit.  She is requesting a memory evaluation in order to return to driving. She is accompanied by her daughter.  Shortly after her stroke, she was advised no driving by PCP Dr. Maryanne due to memory concerns and confusion.  She recently completed HH therapies after working with PT/OT/SLP for 3 month duration and reports making excellent recovery currently at baseline functioning.  She continues to live alone. She is able to maintain all ADLs and majority of IADLs except for driving. She has since returned back to managing her finances and bill paying as well as medications. Continues use of RW which is baseline for hx of polio with mild LLE weakness. Prior to stroke, only driving short distance. She would refrain from nighttime driving and avoid busier roads or highways.  MMSE today 29/30 (missed 1 recall word)  Compliant on Eliquis  without side effects.  Blood pressure today 132/76.   Denies new stroke/TIA symptoms. No further concerns at this time.   Initial visit 07/18/2021 Dr. Rosemarie: [name] is a pleasant 89 year old Caucasian lady seen today for initial office consultation visit for stroke.  History is obtained from the patient and her daughter is accompanying her as well as review of electronic medical records in epic as well as care everywhere.  Pertinent imaging films were also reviewed.  She has past medical history of coronary artery disease, hypertension,  hyperlipidemia, osteopenia who presented on 03/27/2021 with being found by family on the floor near her recliner at home.  Her last known well was at 1930 hrs. on 03/26/2021.  She lives alone but is independent of most actives of daily living and does walk with a walker.  On EMS arrival she had right gaze deviation with left facial droop and left hemiparesis.  CT scan showed right temporal, insular and basal ganglia hypodensities and CT angiogram of the head and neck showed occlusion of the right internal carotid artery just beyond the bulb extending to the supraclinoid portion and CT perfusion scan showed a large penumbra.  Patient's family consented for mechanical thrombectomy but since there was another patient on the table at [facility] she had to be emergently  transferred to [facility] using interventional radiology transfer line after discussing the case with Dr. Lester who accepted the patient for emergent transfer and performed emergent thrombectomy resulting in TICI 2C final revascularization.  Review of records at [facility] in Care Everywhere.  Initial NIH stroke scale on admission was 14.  MRI scan showed moderate sized acute infarct involving right basal ganglia, insula and right temporoparietal lobes with tiny acute infarct  in the right cerebellum with petechial hemorrhage but no frank hematoma.  Transthoracic echo showed left ventricular ejection fraction of 60 to 65%.  Hemoglobin A1c is 5.3.  Lipid profile was 141.  Patient was transferred to inpatient rehab at [facility] skilled nursing facility for 2 and half weeks and subsequently recovered to go back home and live independently.  Her NIH stroke scale was improved to 2 at discharge.  Patient is able to walk with a walker.  She states that most of her strength has improved but her stamina has not she gets tired more easily particularly when she is walking for a while and has to rest.  She is no longer able to do finances as it  has been some cognitive deterioration noted and the daughter has taken that over.  Patient was subsequently found to have paroxysmal A. fib on a 30-day heart monitor and now has been switched to Eliquis .  She does complain of some minor bruising but no bleeding.  Blood pressure is well controlled today it is 129/78.  He has not had any recurrent stroke or TIA symptoms.      ROS:   14 system review of systems is positive for those listed in HPI and all other systems negative  PMH:  Past Medical History:  Diagnosis Date   Adjustment disorder with mixed anxiety and depressed mood    Atrial fibrillation (HCC) 11/04/2015   Post-operative   Benign hypertensive heart disease without heart failure    CAD (coronary artery disease)    Infection of urinary tract 10/31/2015   Interstitial cystitis    Osteopenia    Other and unspecified hyperlipidemia    Other dyspnea and respiratory abnormality    Pelvic fracture (HCC) 05/30/2015   Post-polio syndrome    polio age 82   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)     Social History:  Social History   Socioeconomic History   Marital status: Widowed    Spouse name: Not on file   Number of children: 2   Years of education: Not on file   Highest education level: High school graduate  Occupational History   Occupation: retired    Associate Professor: [employer]    Comment: admin. assistant  Tobacco Use   Smoking status: Never   Smokeless tobacco: Never  Vaping Use   Vaping status: Never Used  Substance and Sexual Activity   Alcohol use: No   Drug use: No   Sexual activity: Not on file  Other Topics Concern   Not on file  Social History Narrative   01/04/22 lives alone   Daughter and granddaughter live close by and visit daily   Right Handed   Drinks no caffeine daily   Social Drivers of Health   Financial Resource Strain: Low Risk  (01/15/2024)   Overall Financial Resource Strain (CARDIA)    Difficulty of Paying Living  Expenses: Not hard at all  Food Insecurity: No Food Insecurity (02/19/2024)   Hunger Vital Sign    Worried About Running Out of Food in the Last Year: Never true    Ran Out of Food in the Last Year: Never true  Transportation Needs: No Transportation Needs (02/19/2024)   PRAPARE - Administrator, Civil Service (Medical): No    Lack of Transportation (Non-Medical): No  Physical Activity: Inactive (01/15/2024)   Exercise Vital Sign    Days of Exercise per Week: 0 days  Minutes of Exercise per Session: 0 min  Stress: No Stress Concern Present (01/15/2024)   [employer] of Occupational Health - Occupational Stress Questionnaire    Feeling of Stress : Not at all  Social Connections: Moderately Integrated (02/20/2024)   Social Connection and Isolation Panel    Frequency of Communication with Friends and Family: More than three times a week    Frequency of Social Gatherings with Friends and Family: Three times a week    Attends Religious Services: More than 4 times per year    Active Member of Clubs or Organizations: Yes    Attends Banker Meetings: More than 4 times per year    Marital Status: Widowed  Intimate Partner Violence: Not At Risk (02/20/2024)   Humiliation, Afraid, Rape, and Kick questionnaire    Fear of Current or Ex-Partner: No    Emotionally Abused: No    Physically Abused: No    Sexually Abused: No    Medications:   Current Outpatient Medications on File Prior to Visit  Medication Sig Dispense Refill   acetaminophen  (TYLENOL ) 325 MG tablet Take 325 mg by mouth every 6 (six) hours as needed.     apixaban  (ELIQUIS ) 2.5 MG TABS tablet Take 1 tablet (2.5 mg total) by mouth 2 (two) times daily. 180 tablet 1   cholecalciferol  (VITAMIN D3) 25 MCG (1000 UNIT) tablet Take 1,000 Units by mouth in the morning.     Cyanocobalamin  (VITAMIN B-12 PO) Take 500 mcg by mouth in the morning.     famotidine  (PEPCID ) 20 MG tablet Take 1 tablet (20 mg total) by  mouth 2 (two) times daily. 180 tablet 3   furosemide  (LASIX ) 20 MG tablet Take 0.5 tablets (10 mg total) by mouth daily. 45 tablet 3   levothyroxine  (SYNTHROID ) 50 MCG tablet Take 1 tablet (50 mcg total) by mouth daily before breakfast. 90 tablet 1   metoprolol  succinate (TOPROL  XL) 25 MG 24 hr tablet Take 1 tablet (25 mg total) by mouth daily. 90 tablet 3   mirtazapine (REMERON) 15 MG tablet Take 15 mg by mouth at bedtime.     Multiple Vitamins-Minerals (PRESERVISION AREDS 2 PO) Take 1 capsule by mouth 2 (two) times daily.     PARoxetine  (PAXIL ) 20 MG tablet Take 1 tablet (20 mg total) by mouth daily. 90 tablet 1   QUEtiapine  (SEROQUEL ) 50 MG tablet Take 1 tablet (50 mg total) by mouth at bedtime. 90 tablet 3   No current facility-administered medications on file prior to visit.    Allergies:   Allergies  Allergen Reactions   Nitrofurantoin Other (See Comments)    Liver failure   Nsaids Other (See Comments)    Kidney failure   Other Other (See Comments)    Most antibiotics require supervision per patient   Beta Adrenergic Blockers Other (See Comments)    fatigue   Flonase [Fluticasone Propionate] Other (See Comments)    headache   Prevnar 13 [Pneumococcal 13-Val Conj Vacc] Other (See Comments)    Nerve damage in left arm   Propranolol Hcl Other (See Comments)    Unknown   Hydrocodone Other (See Comments)   Alendronate Sodium Other (See Comments)   Ciprofloxacin  Other (See Comments)    Patient denies allergy--I take Cipro  for bladder infections   Codeine Other (See Comments)   Crestor [Rosuvastatin Calcium ] Other (See Comments)   Darvon Other (See Comments)   Lipitor [Atorvastatin  Calcium ] Other (See Comments)   Nortriptyline  Hcl Other (See Comments)  States she felt like worms where crawling under her skin.   Potassium-Containing Compounds     Rash and raw areas / sore breast    Pravachol Other (See Comments)   Sulfa Antibiotics Other (See Comments)   Sulfamethoxazole  Other (See Comments)    Unknown   Ultram  [Tramadol  Hcl] Itching   Welchol [Colesevelam Hcl] Other (See Comments)   Zetia [Ezetimibe] Other (See Comments)   Zocor [Simvastatin] Other (See Comments)    Physical Exam Today's Vitals   05/20/24 0953  BP: 124/69  Pulse: 73  Weight: 107 lb (48.5 kg)  Height: 5' 4 (1.626 m)   Body mass index is 18.37 kg/m.   General: frail elderly Caucasian lady, seated in w/c, agitated, confused   Neurologic Exam Mental Status: Awake and fully alert. Fluent speech and language.  Oriented to place, disoriented to time. Recent and remote memory impaired. Attention span, concentration and fund of knowledge impaired.  Cranial Nerves: Pupils equal, briskly reactive to light. Extraocular movements full without nystagmus. Visual fields full to confrontation. HOH bilaterally. facial sensation intact. Face, tongue, palate moves normally and symmetrically.  Motor: Normal bulk and tone. Normal strength in all tested extremity muscles except mild LLE weakness chronic from polio Sensory.: intact to touch , pinprick , position and vibratory sensation.  Gait and Station: deferred, RW not present Reflexes: 1+ and symmetric. Toes downgoing.       05/20/2024    9:58 AM 10/11/2023    8:22 AM 09/20/2021    9:27 AM 08/23/2018   10:57 AM  MMSE - Mini Mental State Exam  Orientation to time 3 5 5 5   Orientation to Place 5 5 5 5   Registration 3 3 3 3   Attention/ Calculation 4 5 5 5   Recall 3 3 2 3   Language- name 2 objects 2 2 2 2   Language- repeat 1 1 1 1   Language- follow 3 step command 3 3 3 3   Language- read & follow direction 1 1 1 1   Write a sentence 1 1 1 1   Copy design 1 1 1 1   Total score 27 30 29 30         ASSESSMENT: 89 year old Caucasian lady with right MCA infarct secondary to right carotid occlusion likely from paroxysmal A. fib in May 2022 treated with emergent mechanical thrombectomy with TICI 2 C vascularization and excellent clinical outcome  and complete stroke recovery.  Vascular risk factors of hypertension, hyperlipidemia, paroxysmal A. fib and age.  New onset auditory and visual hallucinations around 05/2023 which are formed and fixed without significant cognitive decline of unclear etiology    PLAN:   Hallucinations Cognitive impairment with behaviors  Surprisingly, continues to do well on MMSE, currently 27/30, previously 30/30, cognitive decline per daughter; question possible Lewy body dementia with hallucinations and increased falls with gait instability Discussed trial of memantine to help slow cognitive decline and potentially help with behaviors. Daughter would like to further consider and will call if interested Currently on Seroquel  50 mg nightly, mirtazapine 15 mg nightly and paroxetine  20 mg daily for mood per PCP/palliative care. Advised to discuss potentially increasing mirtazapine dosage at next palliative care visit.  Patient is not currently overly bothered by hallucinations and actually denies them occurring although per daughter, they still occur. If not overly bothered, would be hesitant to try to treat these as risk would outweigh benefit.  MRI brain 11/2023 subacute left PICA infarct, remote infarcts right mid temporal, right frontal and subcortical regions Will place  new order for EEG - discussed indication for having this completed   Hx of strokes Continue Eliquis  2.5 mg twice daily with history of A-fib for secondary stroke prevention managed/prescribed by PCP/cardiology Continue close PCP and cardiology follow-up for aggressive stroke risk factor management     Follow-up in 6 to 8 months or call earlier if needed    I personally spent a total of 45 minutes in the care of the patient today including preparing to see the patient, getting/reviewing separately obtained history, performing a medically appropriate exam/evaluation, counseling and educating, placing orders, and documenting clinical  information in the EHR.  Harlene Bogaert, AGNP-BC  [facility] [address] [place], [place] [address]  Phone [phone] Fax [phone] Note: This document was prepared with digital dictation and possible smart phrase technology. Any transcriptional errors that result from this process are unintentional.

## 2024-05-20 NOTE — Patient Instructions (Addendum)
 Your Plan:  Consider starting Namenda which can help slow memory decline overtime and can also help with behaviors   Please follow up with palliative care regarding increasing mirtazapine dosage to further help with behaviors   Continue working with PT at home for general strengthening and fall prevention     Follow up in 6-8 months with Dr. Rosemarie or call earlier if needed     Thank you for coming to see us  at [facility]. I hope we have been able to provide you high quality care today.  You may receive a patient satisfaction survey over the next few weeks. We would appreciate your feedback and comments so that we may continue to improve ourselves and the health of our patients.    Namenda (Memantine) Tablets What is this medication? MEMANTINE (MEM an teen) treats memory loss and confusion (dementia) in people who have Alzheimer disease. It works by improving attention, memory, and the ability to engage in daily activities. It is not a cure for dementia or Alzheimer disease. This medicine may be used for other purposes; ask your health care provider or pharmacist if you have questions. COMMON BRAND NAME(S): Namenda What should I tell my care team before I take this medication? They need to know if you have any of these conditions: Kidney disease Liver disease Seizures Trouble passing urine An unusual or allergic reaction to memantine, other medications, foods, dyes, or preservatives Pregnant or trying to get pregnant Breast-feeding How should I use this medication? Take this medication by mouth with water. Follow the directions on the prescription label. You may take this medication with or without food. Take your doses at regular intervals. Do not take your medication more often than directed. Continue to take your medication even if you feel better. Do not stop taking except on the advice of your care team. Talk to your care team about the use of this  medication in children. Special care may be needed Overdosage: If you think you have taken too much of this medicine contact a poison control center or emergency room at once. NOTE: This medicine is only for you. Do not share this medicine with others. What if I miss a dose? If you miss a dose, take it as soon as you can. If it is almost time for your next dose, take only that dose. Do not take double or extra doses. If you do not take your medication for several days, contact your care team. Your dose may need to be changed. What may interact with this medication? Acetazolamide Amantadine Cimetidine Dextromethorphan Dofetilide Hydrochlorothiazide  Ketamine Metformin Methazolamide Quinidine Ranitidine Sodium bicarbonate  Triamterene This list may not describe all possible interactions. Give your health care provider a list of all the medicines, herbs, non-prescription drugs, or dietary supplements you use. Also tell them if you smoke, drink alcohol, or use illegal drugs. Some items may interact with your medicine. What should I watch for while using this medication? Visit your care team for regular checks on your progress. Check with your care team if there is no improvement in your symptoms or if they get worse. This medication may affect your coordination, reaction time, or judgment. Do not drive or operate machinery until you know how this medication affects you. Sit up or stand slowly to reduce the risk of dizzy or fainting spells. Drinking alcohol with this medication can increase the risk of these side effects. What side effects may I notice from receiving this medication? Side effects that  you should report to your care team as soon as possible: Allergic reactions--skin rash, itching, hives, swelling of the face, lips, tongue, or throat Side effects that usually do not require medical attention (report to your care team if they continue or are  bothersome): Confusion Constipation Diarrhea Dizziness Headache This list may not describe all possible side effects. Call your doctor for medical advice about side effects. You may report side effects to FDA at 1-800-FDA-1088. Where should I keep my medication? Keep out of the reach of children. Store at room temperature between 15 degrees and 30 degrees C (59 degrees and 86 degrees F). Throw away any unused medication after the expiration date. NOTE: This sheet is a summary. It may not cover all possible information. If you have questions about this medicine, talk to your doctor, pharmacist, or health care provider.  2024 Elsevier/Gold Standard (2021-12-06 00:00:00)

## 2024-05-22 DIAGNOSIS — Z951 Presence of aortocoronary bypass graft: Secondary | ICD-10-CM | POA: Diagnosis not present

## 2024-05-22 DIAGNOSIS — Z9181 History of falling: Secondary | ICD-10-CM | POA: Diagnosis not present

## 2024-05-22 DIAGNOSIS — Z79899 Other long term (current) drug therapy: Secondary | ICD-10-CM | POA: Diagnosis not present

## 2024-05-22 DIAGNOSIS — E7800 Pure hypercholesterolemia, unspecified: Secondary | ICD-10-CM | POA: Diagnosis not present

## 2024-05-22 DIAGNOSIS — I480 Paroxysmal atrial fibrillation: Secondary | ICD-10-CM | POA: Diagnosis not present

## 2024-05-22 DIAGNOSIS — I700 Atherosclerosis of aorta: Secondary | ICD-10-CM | POA: Diagnosis not present

## 2024-05-22 DIAGNOSIS — Z7901 Long term (current) use of anticoagulants: Secondary | ICD-10-CM | POA: Diagnosis not present

## 2024-05-22 DIAGNOSIS — I2510 Atherosclerotic heart disease of native coronary artery without angina pectoris: Secondary | ICD-10-CM | POA: Diagnosis not present

## 2024-05-22 DIAGNOSIS — E039 Hypothyroidism, unspecified: Secondary | ICD-10-CM | POA: Diagnosis not present

## 2024-05-22 DIAGNOSIS — Z8673 Personal history of transient ischemic attack (TIA), and cerebral infarction without residual deficits: Secondary | ICD-10-CM | POA: Diagnosis not present

## 2024-05-22 DIAGNOSIS — S32592D Other specified fracture of left pubis, subsequent encounter for fracture with routine healing: Secondary | ICD-10-CM | POA: Diagnosis not present

## 2024-05-22 DIAGNOSIS — D649 Anemia, unspecified: Secondary | ICD-10-CM | POA: Diagnosis not present

## 2024-05-22 DIAGNOSIS — I447 Left bundle-branch block, unspecified: Secondary | ICD-10-CM | POA: Diagnosis not present

## 2024-05-22 DIAGNOSIS — I5043 Acute on chronic combined systolic (congestive) and diastolic (congestive) heart failure: Secondary | ICD-10-CM | POA: Diagnosis not present

## 2024-05-22 DIAGNOSIS — N179 Acute kidney failure, unspecified: Secondary | ICD-10-CM | POA: Diagnosis not present

## 2024-05-22 DIAGNOSIS — Z556 Problems related to health literacy: Secondary | ICD-10-CM | POA: Diagnosis not present

## 2024-05-22 DIAGNOSIS — N390 Urinary tract infection, site not specified: Secondary | ICD-10-CM | POA: Diagnosis not present

## 2024-05-22 DIAGNOSIS — M8580 Other specified disorders of bone density and structure, unspecified site: Secondary | ICD-10-CM | POA: Diagnosis not present

## 2024-05-22 DIAGNOSIS — I110 Hypertensive heart disease with heart failure: Secondary | ICD-10-CM | POA: Diagnosis not present

## 2024-05-27 DIAGNOSIS — Z556 Problems related to health literacy: Secondary | ICD-10-CM | POA: Diagnosis not present

## 2024-05-27 DIAGNOSIS — Z7901 Long term (current) use of anticoagulants: Secondary | ICD-10-CM | POA: Diagnosis not present

## 2024-05-27 DIAGNOSIS — I5043 Acute on chronic combined systolic (congestive) and diastolic (congestive) heart failure: Secondary | ICD-10-CM | POA: Diagnosis not present

## 2024-05-27 DIAGNOSIS — D649 Anemia, unspecified: Secondary | ICD-10-CM | POA: Diagnosis not present

## 2024-05-27 DIAGNOSIS — I110 Hypertensive heart disease with heart failure: Secondary | ICD-10-CM | POA: Diagnosis not present

## 2024-05-27 DIAGNOSIS — M8580 Other specified disorders of bone density and structure, unspecified site: Secondary | ICD-10-CM | POA: Diagnosis not present

## 2024-05-27 DIAGNOSIS — I2510 Atherosclerotic heart disease of native coronary artery without angina pectoris: Secondary | ICD-10-CM | POA: Diagnosis not present

## 2024-05-27 DIAGNOSIS — Z951 Presence of aortocoronary bypass graft: Secondary | ICD-10-CM | POA: Diagnosis not present

## 2024-05-27 DIAGNOSIS — I700 Atherosclerosis of aorta: Secondary | ICD-10-CM | POA: Diagnosis not present

## 2024-05-27 DIAGNOSIS — I480 Paroxysmal atrial fibrillation: Secondary | ICD-10-CM | POA: Diagnosis not present

## 2024-05-27 DIAGNOSIS — Z9181 History of falling: Secondary | ICD-10-CM | POA: Diagnosis not present

## 2024-05-27 DIAGNOSIS — E039 Hypothyroidism, unspecified: Secondary | ICD-10-CM | POA: Diagnosis not present

## 2024-05-27 DIAGNOSIS — Z8673 Personal history of transient ischemic attack (TIA), and cerebral infarction without residual deficits: Secondary | ICD-10-CM | POA: Diagnosis not present

## 2024-05-27 DIAGNOSIS — N390 Urinary tract infection, site not specified: Secondary | ICD-10-CM | POA: Diagnosis not present

## 2024-05-27 DIAGNOSIS — S32592D Other specified fracture of left pubis, subsequent encounter for fracture with routine healing: Secondary | ICD-10-CM | POA: Diagnosis not present

## 2024-05-27 DIAGNOSIS — N179 Acute kidney failure, unspecified: Secondary | ICD-10-CM | POA: Diagnosis not present

## 2024-05-27 DIAGNOSIS — I447 Left bundle-branch block, unspecified: Secondary | ICD-10-CM | POA: Diagnosis not present

## 2024-05-27 DIAGNOSIS — Z79899 Other long term (current) drug therapy: Secondary | ICD-10-CM | POA: Diagnosis not present

## 2024-05-27 DIAGNOSIS — E7800 Pure hypercholesterolemia, unspecified: Secondary | ICD-10-CM | POA: Diagnosis not present

## 2024-06-02 ENCOUNTER — Other Ambulatory Visit

## 2024-06-02 LAB — BASIC METABOLIC PANEL WITH GFR
BUN/Creatinine Ratio: 27 (ref 12–28)
BUN: 32 mg/dL — ABNORMAL HIGH (ref 8–27)
CO2: 23 mmol/L (ref 20–29)
Calcium: 9.2 mg/dL (ref 8.7–10.3)
Chloride: 105 mmol/L (ref 96–106)
Creatinine, Ser: 1.19 mg/dL — ABNORMAL HIGH (ref 0.57–1.00)
Glucose: 77 mg/dL (ref 70–99)
Potassium: 4.4 mmol/L (ref 3.5–5.2)
Sodium: 144 mmol/L (ref 134–144)
eGFR: 44 mL/min/1.73 — ABNORMAL LOW (ref >59)

## 2024-06-03 DIAGNOSIS — Z7901 Long term (current) use of anticoagulants: Secondary | ICD-10-CM | POA: Diagnosis not present

## 2024-06-03 DIAGNOSIS — N390 Urinary tract infection, site not specified: Secondary | ICD-10-CM | POA: Diagnosis not present

## 2024-06-03 DIAGNOSIS — I700 Atherosclerosis of aorta: Secondary | ICD-10-CM | POA: Diagnosis not present

## 2024-06-03 DIAGNOSIS — Z556 Problems related to health literacy: Secondary | ICD-10-CM | POA: Diagnosis not present

## 2024-06-03 DIAGNOSIS — D649 Anemia, unspecified: Secondary | ICD-10-CM | POA: Diagnosis not present

## 2024-06-03 DIAGNOSIS — M8580 Other specified disorders of bone density and structure, unspecified site: Secondary | ICD-10-CM | POA: Diagnosis not present

## 2024-06-03 DIAGNOSIS — Z8673 Personal history of transient ischemic attack (TIA), and cerebral infarction without residual deficits: Secondary | ICD-10-CM | POA: Diagnosis not present

## 2024-06-03 DIAGNOSIS — Z9181 History of falling: Secondary | ICD-10-CM | POA: Diagnosis not present

## 2024-06-03 DIAGNOSIS — I447 Left bundle-branch block, unspecified: Secondary | ICD-10-CM | POA: Diagnosis not present

## 2024-06-03 DIAGNOSIS — I2510 Atherosclerotic heart disease of native coronary artery without angina pectoris: Secondary | ICD-10-CM | POA: Diagnosis not present

## 2024-06-03 DIAGNOSIS — N179 Acute kidney failure, unspecified: Secondary | ICD-10-CM | POA: Diagnosis not present

## 2024-06-03 DIAGNOSIS — Z79899 Other long term (current) drug therapy: Secondary | ICD-10-CM | POA: Diagnosis not present

## 2024-06-03 DIAGNOSIS — I110 Hypertensive heart disease with heart failure: Secondary | ICD-10-CM | POA: Diagnosis not present

## 2024-06-03 DIAGNOSIS — Z951 Presence of aortocoronary bypass graft: Secondary | ICD-10-CM | POA: Diagnosis not present

## 2024-06-03 DIAGNOSIS — I480 Paroxysmal atrial fibrillation: Secondary | ICD-10-CM | POA: Diagnosis not present

## 2024-06-03 DIAGNOSIS — I5043 Acute on chronic combined systolic (congestive) and diastolic (congestive) heart failure: Secondary | ICD-10-CM | POA: Diagnosis not present

## 2024-06-03 DIAGNOSIS — E039 Hypothyroidism, unspecified: Secondary | ICD-10-CM | POA: Diagnosis not present

## 2024-06-03 DIAGNOSIS — E7800 Pure hypercholesterolemia, unspecified: Secondary | ICD-10-CM | POA: Diagnosis not present

## 2024-06-03 DIAGNOSIS — S32592D Other specified fracture of left pubis, subsequent encounter for fracture with routine healing: Secondary | ICD-10-CM | POA: Diagnosis not present

## 2024-06-08 ENCOUNTER — Ambulatory Visit: Payer: Self-pay | Admitting: Cardiology

## 2024-06-14 ENCOUNTER — Other Ambulatory Visit: Payer: Self-pay | Admitting: Family Medicine

## 2024-06-14 DIAGNOSIS — I10 Essential (primary) hypertension: Secondary | ICD-10-CM

## 2024-06-18 ENCOUNTER — Telehealth: Payer: Self-pay | Admitting: Adult Health

## 2024-06-18 MED ORDER — MEMANTINE HCL 5 MG PO TABS: 5.0000 mg | ORAL_TABLET | Freq: Two times a day (BID) | ORAL | 11 refills | Status: DC

## 2024-06-18 NOTE — Telephone Encounter (Signed)
 Attempted to call Pt dtr. No answer, LVM for call back.

## 2024-06-18 NOTE — Telephone Encounter (Signed)
 Spoke w/Pt dtr regarding memantine  and dosing recommended by NP. Advised dtr if any side effects or changes in Pt to call our office. Dtr voiced understanding and thanks for the call.

## 2024-06-18 NOTE — Telephone Encounter (Signed)
 Pt daughter called to return call from Nurse,  informed that she will get call back

## 2024-06-18 NOTE — Telephone Encounter (Signed)
 Received community message from Shona Rigg, NP through palliative care as noted below   Rigg Shona ORN, NP  Whitfield Raisin, NP Phone Number: [phone]   Raisin I am Shona Rigg [facility], palliative care NP and director of the Serious Illness Care division of [facility]. I saw Ms Suddreth's daughter this am. The patient did not arrive with her for her f/u appt here in our palliative care clinic due to the heat. ? After talking about starting memantine , the daughter has agreed to start and if her mom continues to decline in the following months, she will probably stop? Would you mind sending in order for memantine ? After 6 weeks or so, I'll reassess the need for increase in mirtazipine dosage and take care of that. Thanks for allowing me to partner with you in Mrs.Braxton's care. Shona  Will place order to start memantine .  Based on creatinine clearance of 25, max dose if 5mg  twice daily. Recommend she start at 5mg  daily for 1 week and if tolerating, increase to 5mg  twice daily.  Please advise daughter.  Thank you.

## 2024-06-19 ENCOUNTER — Ambulatory Visit: Admitting: Neurology

## 2024-06-19 DIAGNOSIS — R4182 Altered mental status, unspecified: Secondary | ICD-10-CM

## 2024-06-19 DIAGNOSIS — R441 Visual hallucinations: Secondary | ICD-10-CM

## 2024-06-19 DIAGNOSIS — Z8673 Personal history of transient ischemic attack (TIA), and cerebral infarction without residual deficits: Secondary | ICD-10-CM

## 2024-06-23 ENCOUNTER — Ambulatory Visit: Payer: Self-pay | Admitting: Adult Health

## 2024-07-04 ENCOUNTER — Ambulatory Visit: Payer: Medicare Other | Admitting: Family Medicine

## 2024-07-04 ENCOUNTER — Encounter: Payer: Self-pay | Admitting: Family Medicine

## 2024-07-04 VITALS — BP 133/81 | HR 71 | Ht 64.0 in | Wt 114.0 lb

## 2024-07-04 DIAGNOSIS — E039 Hypothyroidism, unspecified: Secondary | ICD-10-CM | POA: Diagnosis not present

## 2024-07-04 DIAGNOSIS — I5032 Chronic diastolic (congestive) heart failure: Secondary | ICD-10-CM | POA: Diagnosis not present

## 2024-07-04 DIAGNOSIS — I10 Essential (primary) hypertension: Secondary | ICD-10-CM

## 2024-07-04 DIAGNOSIS — R399 Unspecified symptoms and signs involving the genitourinary system: Secondary | ICD-10-CM | POA: Diagnosis not present

## 2024-07-04 DIAGNOSIS — E7800 Pure hypercholesterolemia, unspecified: Secondary | ICD-10-CM | POA: Diagnosis not present

## 2024-07-04 DIAGNOSIS — I4819 Other persistent atrial fibrillation: Secondary | ICD-10-CM

## 2024-07-04 DIAGNOSIS — F4323 Adjustment disorder with mixed anxiety and depressed mood: Secondary | ICD-10-CM | POA: Diagnosis not present

## 2024-07-04 LAB — URINALYSIS
Bilirubin, UA: NEGATIVE
Glucose, UA: NEGATIVE
Ketones, UA: NEGATIVE
Nitrite, UA: NEGATIVE
Protein,UA: NEGATIVE
RBC, UA: NEGATIVE
Specific Gravity, UA: 1.01 (ref 1.005–1.030)
Urobilinogen, Ur: 0.2 mg/dL (ref 0.2–1.0)
pH, UA: 7 (ref 5.0–7.5)

## 2024-07-04 MED ORDER — CEPHALEXIN 500 MG PO CAPS: 500.0000 mg | ORAL_CAPSULE | Freq: Four times a day (QID) | ORAL | 0 refills | Status: DC

## 2024-07-04 NOTE — Progress Notes (Signed)
 BP 133/81   Pulse 71   Ht 5' 4 (1.626 m)   Wt 114 lb (51.7 kg)   LMP 03/21/1971   SpO2 95%   BMI 19.57 kg/m    Subjective:   Patient ID: Emily Wagner, female    DOB: 1933/11/20, 89 y.o.   MRN: [id number]  HPI: Emily Wagner is a 89 y.o. female presenting on 07/04/2024 for Medical Management of Chronic Issues, Hypertension, and Urinary Tract Infection   Discussed the use of AI scribe software for clinical note transcription with the patient, who gave verbal consent to proceed.  History of Present Illness   Emily Wagner is an 89 year old female with hypertension, hypercholesterolemia, atrial fibrillation, hypothyroidism, and congestive heart failure who presents for a recheck of chronic medical issues.  She has been experiencing worsening confusion over the past week, accompanied by hallucinations of a woman in her house at night who threatens her, and she believes a man named Emily Wagner is involved. These episodes have been occurring daily this week. Her caregiver reports stopping mirtazapine due to nighttime confusion and increasing quetiapine  to 75 mg, which has improved her sleep. She has been on memantine  since June 24, 2024, for memory issues.  She experiences urinary incontinence, which she attributes to taking more of her diuretic medication. No dysuria, hematuria, or pain. She is taking a diuretic.  She continues to take her thyroid  medication daily without noticing specific symptoms related to hypothyroidism. She reports increased fatigue and decreased metabolism but is unsure if these are related to her thyroid  condition.  She is on a blood thinner for atrial fibrillation. She has been taking warfarin, which requires frequent monitoring.          Relevant past medical, surgical, family and social history reviewed and updated as indicated. Interim medical history since our last visit reviewed. Allergies and medications reviewed and updated.  Review of  Systems  Constitutional:  Negative for chills and fever.  Eyes:  Negative for redness and visual disturbance.  Respiratory:  Negative for chest tightness and shortness of breath.   Cardiovascular:  Negative for chest pain and leg swelling.  Gastrointestinal:  Negative for abdominal pain, constipation, diarrhea and nausea.  Genitourinary:  Positive for frequency. Negative for decreased urine volume, difficulty urinating, dysuria and hematuria.  Skin:  Negative for rash.  Neurological:  Negative for light-headedness and headaches.  Psychiatric/Behavioral:  Positive for agitation, confusion and hallucinations. Negative for behavioral problems.   All other systems reviewed and are negative.   Per HPI unless specifically indicated above   Allergies as of 07/04/2024       Reactions   Nitrofurantoin Other (See Comments)   Liver failure   Nsaids Other (See Comments)   Kidney failure   Other Other (See Comments)   Most antibiotics require supervision per patient   Beta Adrenergic Blockers Other (See Comments)   fatigue   Flonase [fluticasone Propionate] Other (See Comments)   headache   Prevnar 13 [pneumococcal 13-val Conj Vacc] Other (See Comments)   Nerve damage in left arm   Propranolol Hcl Other (See Comments)   Unknown   Hydrocodone Other (See Comments)   Alendronate Sodium Other (See Comments)   Ciprofloxacin  Other (See Comments)   Patient denies allergy--I take Cipro  for bladder infections   Codeine Other (See Comments)   Crestor [rosuvastatin Calcium ] Other (See Comments)   Darvon Other (See Comments)   Lipitor [atorvastatin  Calcium ] Other (See Comments)  Nortriptyline  Hcl Other (See Comments)   States she felt like worms where crawling under her skin.   Potassium-containing Compounds    Rash and raw areas / sore breast    Pravachol Other (See Comments)   Sulfa Antibiotics Other (See Comments)   Sulfamethoxazole Other (See Comments)   Unknown   Ultram  [tramadol  Hcl]  Itching   Welchol [colesevelam Hcl] Other (See Comments)   Zetia [ezetimibe] Other (See Comments)   Zocor [simvastatin] Other (See Comments)        Medication List        Accurate as of July 04, 2024  1:19 PM. If you have any questions, ask your nurse or doctor.          acetaminophen  325 MG tablet Commonly known as: TYLENOL  Take 325 mg by mouth every 6 (six) hours as needed.   apixaban  2.5 MG Tabs tablet Commonly known as: ELIQUIS  Take 1 tablet (2.5 mg total) by mouth 2 (two) times daily.   cholecalciferol  25 MCG (1000 UNIT) tablet Commonly known as: VITAMIN D3 Take 1,000 Units by mouth in the morning.   famotidine  20 MG tablet Commonly known as: PEPCID  Take 1 tablet (20 mg total) by mouth 2 (two) times daily.   furosemide  20 MG tablet Commonly known as: LASIX  Take 0.5 tablets (10 mg total) by mouth daily.   levothyroxine  50 MCG tablet Commonly known as: SYNTHROID  TAKE 1 TABLET BY MOUTH DAILY BEFORE BREAKFAST   memantine  5 MG tablet Commonly known as: Namenda  Take 1 tablet (5 mg total) by mouth 2 (two) times daily.   metoprolol  succinate 25 MG 24 hr tablet Commonly known as: Toprol  XL Take 1 tablet (25 mg total) by mouth daily.   mirtazapine 15 MG tablet Commonly known as: REMERON Take 15 mg by mouth at bedtime.   PARoxetine  20 MG tablet Commonly known as: PAXIL  Take 1 tablet (20 mg total) by mouth daily.   PRESERVISION AREDS 2 PO Take 1 capsule by mouth 2 (two) times daily.   QUEtiapine  50 MG tablet Commonly known as: SEROQUEL  Take 1 tablet (50 mg total) by mouth at bedtime.   VITAMIN B-12 PO Take 500 mcg by mouth in the morning.         Objective:   BP 133/81   Pulse 71   Ht 5' 4 (1.626 m)   Wt 114 lb (51.7 kg)   LMP 03/21/1971   SpO2 95%   BMI 19.57 kg/m   Wt Readings from Last 3 Encounters:  07/04/24 114 lb (51.7 kg)  05/20/24 107 lb (48.5 kg)  05/16/24 109 lb (49.4 kg)    Physical Exam Vitals and nursing note reviewed.   Constitutional:      General: She is not in acute distress.    Appearance: She is well-developed. She is not diaphoretic.  Eyes:     Conjunctiva/sclera: Conjunctivae normal.     Pupils: Pupils are equal, round, and reactive to light.  Cardiovascular:     Rate and Rhythm: Normal rate. Rhythm irregular.     Heart sounds: Normal heart sounds. No murmur heard. Pulmonary:     Effort: Pulmonary effort is normal. No respiratory distress.     Breath sounds: Normal breath sounds. No wheezing.  Abdominal:     General: Abdomen is flat. There is no distension.     Palpations: Abdomen is soft.     Tenderness: There is no abdominal tenderness. There is no guarding or rebound.     Hernia: No hernia  is present.  Musculoskeletal:        General: No tenderness. Normal range of motion.  Skin:    General: Skin is warm and dry.     Findings: No rash.  Neurological:     Mental Status: She is alert and oriented to person, place, and time.     Coordination: Coordination normal.  Psychiatric:        Mood and Affect: Mood is anxious.        Behavior: Behavior normal.        Cognition and Memory: She exhibits impaired recent memory.    Physical Exam   CHEST: Lungs clear to auscultation. CARDIOVASCULAR: Irregular heart rhythm, no murmurs. ABDOMEN: Abdomen normal. EXTREMITIES: Minimal swelling in extremities.  Neurological: Short-term memory impairment        Assessment & Plan:   Problem List Items Addressed This Visit       Cardiovascular and Mediastinum   Essential hypertension   Relevant Orders   CBC with Differential/Platelet   CMP14+EGFR   Lipid panel   TSH   Atrial fibrillation (HCC)   Chronic diastolic CHF (congestive heart failure) (HCC)   Relevant Orders   CBC with Differential/Platelet   CMP14+EGFR   Lipid panel   TSH     Endocrine   Acquired hypothyroidism   Relevant Orders   TSH     Other   HYPERCHOLESTEROLEMIA   Relevant Orders   CBC with Differential/Platelet    CMP14+EGFR   Lipid panel   TSH   Adjustment disorder with mixed anxiety and depressed mood   Relevant Orders   CBC with Differential/Platelet   Other Visit Diagnoses       UTI symptoms    -  Primary   Relevant Orders   Urinalysis   Urine Culture           Cognitive impairment with hallucinations Worsening cognitive impairment with new hallucinations. Recent medication changes include stopping mirtazapine and increasing quetiapine , which improved sleep. - Continue quetiapine  at 75 mg. - Keep mirtazapine off until further consultation with palliative care. - Check urine for possible infection.  Depression and anxiety Chronic depression and anxiety with recent medication adjustments. Mirtazapine was stopped, and quetiapine  was increased, improving sleep. - Continue quetiapine  at 75 mg. - Keep mirtazapine off until further consultation with palliative care.  Urinary incontinence Urinary incontinence likely exacerbated by diuretic use for heart failure management. Discussion with cardiologist about fluid management is ongoing. - Discuss fluid management with cardiologist.  Hypertension Blood pressure is well-controlled.  Hypercholesterolemia No medication, just monitoring, diet has mostly been controlling  Hypothyroidism Continues thyroid  medication daily. - Continue current thyroid  medication regimen.   With urinary frequency and possible UTI symptoms, will send cephalexin  for her especially with hallucinatory and increase cognitive impairment.        Follow up plan: Return in about 3 months (around 10/04/2024), or if symptoms worsen or fail to improve, for Thyroid  and hypertension and memory and anxiety recheck.  Counseling provided for all of the vaccine components Orders Placed This Encounter  Procedures   Urine Culture   Urinalysis   CBC with Differential/Platelet   CMP14+EGFR   Lipid panel   TSH    Fonda Levins, MD [facility] 07/04/2024, 1:19 PM

## 2024-07-05 LAB — URINE CULTURE

## 2024-07-07 ENCOUNTER — Ambulatory Visit: Payer: Self-pay | Admitting: Family Medicine

## 2024-07-14 ENCOUNTER — Other Ambulatory Visit: Payer: Self-pay | Admitting: Adult Health

## 2024-07-16 NOTE — Progress Notes (Unsigned)
 Cardiology Office Note:   Date:  07/17/2024  ID:  Emily Wagner, DOB 1933-11-24, MRN [id number] PCP: [name], Fonda LABOR, MD  [facility] Providers Cardiologist:  Lynwood Schilling, MD {  History of Present Illness:   Emily Wagner is a 89 y.o. female who presents for evaluation of atrial fib.     She has a history of  bypass surgery.  She has had fluctuating BPs.  She has a diagnosis of post polio syndrome.   She has chronic fatigue.   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  She was hospitalized May of last year for this.      She was admitted from 02/17/2024 to 02/20/2024 for atrial fibrillation with RVR after presenting with increased fatigue and weakness. Rates were initially in the 150s. Echo showed LVEF of 40-45% with global hypokinesis and LV dyssynchrony due to LBBB, mildly RV function, severe left atrial enlargement, probable moderate low-gradient low-flow aortic stenosis, moderate to severe mitral regurgitation, and mild to moderate tricuspid regurgitation. She was initially was started on IV Cardizem  with improvement in rates but this was stopped due to reduced EF. She was was started on Toprol -XL and continued on home Digoxin . High-sensitivity troponin was minimally elevated in the 30s to 40s which was felt to be secondary to demand ischemia. BNP was elevated in the 800s and chest x-ray showed small bilateral pleural effusion so he was given one dose of IV Lasix  on admission but had worsening renal function with this so was not given any more diuretics.  When I saw her most recently in the office she was having shortness of breath and frailty walking around and her oxygen saturation went to 91%.  I put her on 10 mg of Lasix .   She returns for follow up.  Her daughter thinks the Lasix  helped her breathing and she is having less dyspnea with exertion.  She says she urinates all the time.  She is not having any acute shortness of breath however which was as mentioned in  particular problem.  She is not having any PND or orthopnea.  She has no new palpitations, presyncope or syncope.  Her creatinine was checked in July and tolerating the low-dose diuretic.  ROS: As stated in the HPI and negative for all other systems.  Studies Reviewed:    EKG:     NA  Risk Assessment/Calculations:    CHA2DS2-VASc Score = 6   This indicates a 9.7% annual risk of stroke. The patient's score is based upon: CHF History: 1 HTN History: 1 Diabetes History: 0 Stroke History: 0 Vascular Disease History: 1 Age Score: 2 Gender Score: 1        Physical Exam:   VS:  BP (!) 153/87   Pulse 79   Ht 5' 4 (1.626 m)   Wt 112 lb 3.2 oz (50.9 kg)   LMP 03/21/1971   SpO2 96%   BMI 19.26 kg/m    Wt Readings from Last 3 Encounters:  07/17/24 112 lb 3.2 oz (50.9 kg)  07/04/24 114 lb (51.7 kg)  05/20/24 107 lb (48.5 kg)     GEN: Well nourished, well developed in no acute distress NECK: No JVD; No carotid bruits CARDIAC: Irregular RR, no murmurs, rubs, gallops RESPIRATORY:  Clear to auscultation without rales, wheezing or rhonchi  ABDOMEN: Soft, non-tender, non-distended EXTREMITIES:  No edema; No deformity   ASSESSMENT AND PLAN:   CAD: The patient has no new sypmtoms.  No  further cardiovascular testing is indicated.  We will continue with aggressive risk reduction and meds as listed.   ATRIAL FIB:    She tolerates anticoagulation.  No change in therapy.  She is having trouble affording the Eliquis  and we will check with our pharmacist to see if there is any help still available for her.   SOB: Her breathing is at baseline she is not having any acute complaints.  I will continue the low-dose diuretic.  DYSLIPIDEMIA:   She has not tolerated statins.  No change in therapy.    HTN:  Her BP is mildly elevated but at home has been well-controlled on previous readings.  She would not want to take additional medicines.  Her daughter will keep an eye on her blood pressure and  let me know if it is sustaining above 140.    Follow up with me in 6 months in [place]  Signed, Lynwood Schilling, MD

## 2024-07-17 ENCOUNTER — Encounter: Payer: Self-pay | Admitting: Cardiology

## 2024-07-17 ENCOUNTER — Other Ambulatory Visit: Payer: Self-pay

## 2024-07-17 ENCOUNTER — Ambulatory Visit: Attending: Cardiology | Admitting: Cardiology

## 2024-07-17 VITALS — BP 153/87 | HR 79 | Ht 64.0 in | Wt 112.2 lb

## 2024-07-17 DIAGNOSIS — I2510 Atherosclerotic heart disease of native coronary artery without angina pectoris: Secondary | ICD-10-CM

## 2024-07-17 DIAGNOSIS — I10 Essential (primary) hypertension: Secondary | ICD-10-CM | POA: Diagnosis not present

## 2024-07-17 DIAGNOSIS — R0602 Shortness of breath: Secondary | ICD-10-CM

## 2024-07-17 DIAGNOSIS — E785 Hyperlipidemia, unspecified: Secondary | ICD-10-CM

## 2024-07-17 MED ORDER — APIXABAN 2.5 MG PO TABS: 2.5000 mg | ORAL_TABLET | Freq: Two times a day (BID) | ORAL | 0 refills | Status: AC

## 2024-07-17 NOTE — Patient Instructions (Signed)
 Medication Instructions:  Your physician recommends that you continue on your current medications as directed. Please refer to the Current Medication list given to you today.  *If you need a refill on your cardiac medications before your next appointment, please call your pharmacy*  Lab Work: NONE If you have labs (blood work) drawn today and your tests are completely normal, you will receive your results only by: MyChart Message (if you have MyChart) OR A paper copy in the mail If you have any lab test that is abnormal or we need to change your treatment, we will call you to review the results.  Testing/Procedures: NONE  Follow-Up: At [facility], you and your health needs are our priority.  As part of our continuing mission to provide you with exceptional heart care, our providers are all part of one team.  This team includes your primary Cardiologist (physician) and Advanced Practice Providers or APPs (Physician Assistants and Nurse Practitioners) who all work together to provide you with the care you need, when you need it.  Your next appointment:   6 months in [place]  Provider:   Lavona, MD  We recommend signing up for the patient portal called MyChart.  Sign up information is provided on this After Visit Summary.  MyChart is used to connect with patients for Virtual Visits (Telemedicine).  Patients are able to view lab/test results, encounter notes, upcoming appointments, etc.  Non-urgent messages can be sent to your provider as well.   To learn more about what you can do with MyChart, go to [email or web address].   Other Instructions

## 2024-08-14 DIAGNOSIS — Z515 Encounter for palliative care: Secondary | ICD-10-CM | POA: Diagnosis not present

## 2024-08-14 DIAGNOSIS — Z602 Problems related to living alone: Secondary | ICD-10-CM | POA: Diagnosis not present

## 2024-08-14 DIAGNOSIS — R5383 Other fatigue: Secondary | ICD-10-CM | POA: Diagnosis not present

## 2024-08-14 DIAGNOSIS — M6281 Muscle weakness (generalized): Secondary | ICD-10-CM | POA: Diagnosis not present

## 2024-09-04 ENCOUNTER — Other Ambulatory Visit: Payer: Self-pay | Admitting: Family Medicine

## 2024-09-04 DIAGNOSIS — F4323 Adjustment disorder with mixed anxiety and depressed mood: Secondary | ICD-10-CM

## 2024-09-04 DIAGNOSIS — I10 Essential (primary) hypertension: Secondary | ICD-10-CM

## 2024-09-08 ENCOUNTER — Telehealth: Payer: Self-pay

## 2024-09-08 DIAGNOSIS — F4323 Adjustment disorder with mixed anxiety and depressed mood: Secondary | ICD-10-CM

## 2024-09-08 MED ORDER — QUETIAPINE FUMARATE 50 MG PO TABS: 75.0000 mg | ORAL_TABLET | Freq: Every day | ORAL | 3 refills | Status: DC

## 2024-09-08 NOTE — Telephone Encounter (Signed)
 Copied from CRM [id number]. Topic: Clinical - Prescription Issue >> Sep 08, 2024  8:32 AM Zane F wrote: Reason for CRM:   No side effects or symptoms present at this time so call was not transferred for triage.  Concern: Taking more than prescribed  Caller: Veva Kraft (Patient's daughter)  Prescription In question: QUEtiapine  (SEROQUEL ) 50 MG tablet  Patient was put on mirtazapine (REMERON) 15 MG tablet by palliative care to be taken in addition to the patient's sleeping prescription listed above but it did not work and caused the patient to wake up at random times in the night; The patient was taken off of mirtazapine (REMERON) 15 MG tablet. Prescription by the patient's daughter and the patient's daughter began giving the patient one and a half of the above prescription ( totaling 75 mg) to assist the patient with sleep. The process of administering the medication has been working so far but with the increase the patient has run out of the listed prescription. When the patient's daughter informed the pharmacy about this they informed her they would need to have the provider provide the pharmacy with a new prescription of the increased dose of 75 mg in order for the prescription to be filled. The pharmacy did inform the patient's daughter that if the prescription is refilled the way it is, it would result in the insurance not covering the prescription. Patient's daughter would like a callback from [facility] (Dr. Williemae nurse).   [facility]erred Pharmacy: [phone] [place] [place] - [address] [place], [place] [address] Phone: [phone]  Fax: [phone]  Callback Number: [phone]

## 2024-09-08 NOTE — Addendum Note (Signed)
 Addended by: MARYANNE CHEW on: 09/08/2024 09:52 AM   Modules accepted: Orders

## 2024-09-08 NOTE — Telephone Encounter (Signed)
 NA

## 2024-09-08 NOTE — Telephone Encounter (Signed)
 Unfortunately they do not make a 75 mg tablet Seroquel  but I did adjust the prescription so she can continue to take 1-1/2 tablets per

## 2024-09-09 ENCOUNTER — Other Ambulatory Visit: Payer: Self-pay | Admitting: Family Medicine

## 2024-09-09 ENCOUNTER — Telehealth: Payer: Self-pay | Admitting: Cardiology

## 2024-09-09 DIAGNOSIS — I10 Essential (primary) hypertension: Secondary | ICD-10-CM

## 2024-09-09 DIAGNOSIS — I4819 Other persistent atrial fibrillation: Secondary | ICD-10-CM

## 2024-09-09 DIAGNOSIS — F4323 Adjustment disorder with mixed anxiety and depressed mood: Secondary | ICD-10-CM

## 2024-09-09 NOTE — Telephone Encounter (Signed)
 Pts daughter calling to state her mother is no longer eligible for free eliquis . They are wondering about more options. Please advise.

## 2024-09-10 ENCOUNTER — Other Ambulatory Visit (HOSPITAL_COMMUNITY): Payer: Self-pay

## 2024-09-10 ENCOUNTER — Telehealth: Payer: Self-pay | Admitting: Pharmacy Technician

## 2024-09-10 MED ORDER — APIXABAN 2.5 MG PO TABS: 2.5000 mg | ORAL_TABLET | Freq: Two times a day (BID) | ORAL | 1 refills | Status: AC

## 2024-09-10 NOTE — Telephone Encounter (Signed)
 Prescription refill request for Eliquis  received. Indication: A-Fib Last office visit:   07/17/2024 Scr:  1.19  last lab on 06/02/2024 Age: 89 yrs old Weight: 50.9 kg  Pt meet protocol and medication is being sent to pt's pharmacy for 6 mos.

## 2024-09-10 NOTE — Telephone Encounter (Signed)
 Patient Advocate Encounter   The patient was approved for a [employer] grant that will help cover the cost of eliquis  Total amount awarded, 7500.  Effective: 08/11/25 - 08/10/25   APW:[id number] ERW:[id number] Hmnle:[id number] PI:[id number] [employer] ID: [id number]   Pharmacy provided with approval and processing information. Patient informed via mychart

## 2024-09-16 NOTE — Telephone Encounter (Signed)
 Left message on daughters phone of Dr. Williemae recommendations and changes. Advised to call back if needed.

## 2024-09-29 ENCOUNTER — Encounter: Payer: Self-pay | Admitting: Radiology

## 2024-10-06 ENCOUNTER — Ambulatory Visit (INDEPENDENT_AMBULATORY_CARE_PROVIDER_SITE_OTHER): Payer: Self-pay | Admitting: Family Medicine

## 2024-10-06 ENCOUNTER — Encounter: Payer: Self-pay | Admitting: Family Medicine

## 2024-10-06 ENCOUNTER — Telehealth: Payer: Self-pay | Admitting: Cardiology

## 2024-10-06 VITALS — BP 120/70 | HR 96 | Ht 64.0 in | Wt 124.0 lb

## 2024-10-06 DIAGNOSIS — I10 Essential (primary) hypertension: Secondary | ICD-10-CM

## 2024-10-06 DIAGNOSIS — F4323 Adjustment disorder with mixed anxiety and depressed mood: Secondary | ICD-10-CM | POA: Diagnosis not present

## 2024-10-06 DIAGNOSIS — Z23 Encounter for immunization: Secondary | ICD-10-CM

## 2024-10-06 DIAGNOSIS — E039 Hypothyroidism, unspecified: Secondary | ICD-10-CM | POA: Diagnosis not present

## 2024-10-06 DIAGNOSIS — E7800 Pure hypercholesterolemia, unspecified: Secondary | ICD-10-CM | POA: Diagnosis not present

## 2024-10-06 NOTE — Telephone Encounter (Signed)
   Pre-operative Risk Assessment    Patient Name: Emily Wagner  DOB: 1933-10-20 MRN: [id number]      Request for Surgical Clearance    Procedure:  Ingrown Removal   Date of Surgery:  Clearance TBD                                 Surgeon: Dr. Velma Boehringer  Surgeon's Group or Practice Name: [facility]  Phone number: [phone] Fax number: [phone]   Type of Clearance Requested:   - Medical  - Pharmacy:  Hold        Type of Anesthesia:  Local    Additional requests/questions:  Please advise surgeon/provider what medications should be held.  Bonney Willie Daring   10/06/2024, 4:57 PM

## 2024-10-06 NOTE — Progress Notes (Addendum)
 BP 120/70   Pulse 96   Ht 5' 4 (1.626 m)   Wt 124 lb (56.2 kg)   LMP 03/21/1971   SpO2 98%   BMI 21.28 kg/m    Subjective:   Patient ID: Emily Wagner, female    DOB: 12-12-1932, 89 y.o.   MRN: [id number]  HPI: Emily Wagner is a 89 y.o. female presenting on 10/06/2024 for Medical Management of Chronic Issues and Hypertension   Discussed the use of AI scribe software for clinical note transcription with the patient, who gave verbal consent to proceed.  History of Present Illness   Emily Wagner is an 89 year old female with heart failure who presents for a recheck and medication management. She is accompanied by her daughter, who is also her primary caregiver.  Heart failure and volume status - Weight gain since last visit - Daughter monitors for peripheral edema due to family history of congestive heart failure - No mention of current swelling or dyspnea  Anticoagulation management - On Eliquis  for atrial fibrillation - No bleeding episodes - Grant secured for Eliquis , resulting in zero copay until September  Thyroid  disease management - Continues thyroid  medication without adverse effects  Mood and appetite disturbance - Takes paroxetine  and quetiapine  for mood management - Quetiapine  dosage adjusted to 75 mg nightly and 25 mg in the morning, with perceived benefit - Mirtazapine previously discontinued due to lack of appetite stimulation  Psychosocial adjustment - Difficulty adjusting to sister's death - Emotional distress related to changes - Daughter provides supportive care and is actively involved          Relevant past medical, surgical, family and social history reviewed and updated as indicated. Interim medical history since our last visit reviewed. Allergies and medications reviewed and updated.  Review of Systems  Constitutional:  Negative for chills and fever.  HENT:  Negative for congestion, ear discharge and ear pain.   Eyes:   Negative for redness and visual disturbance.  Respiratory:  Negative for chest tightness and shortness of breath.   Cardiovascular:  Negative for chest pain, palpitations and leg swelling.  Genitourinary:  Negative for difficulty urinating and dysuria.  Musculoskeletal:  Positive for arthralgias, back pain, gait problem and myalgias.  Skin:  Negative for color change and rash.  Neurological:  Negative for light-headedness and headaches.  Psychiatric/Behavioral:  Positive for confusion, decreased concentration, dysphoric mood and sleep disturbance. Negative for agitation, behavioral problems, self-injury and suicidal ideas. The patient is nervous/anxious.   All other systems reviewed and are negative.   Per HPI unless specifically indicated above   Allergies as of 10/06/2024       Reactions   Nitrofurantoin Other (See Comments)   Liver failure   Nsaids Other (See Comments)   Kidney failure   Other Other (See Comments)   Most antibiotics require supervision per patient   Beta Adrenergic Blockers Other (See Comments)   fatigue   Flonase [fluticasone Propionate] Other (See Comments)   headache   Prevnar 13 [pneumococcal 13-val Conj Vacc] Other (See Comments)   Nerve damage in left arm   Propranolol Hcl Other (See Comments)   Unknown   Hydrocodone Other (See Comments)   Alendronate Sodium Other (See Comments)   Ciprofloxacin  Other (See Comments)   Patient denies allergy--I take Cipro  for bladder infections   Codeine Other (See Comments)   Crestor [rosuvastatin Calcium ] Other (See Comments)   Darvon Other (See Comments)   Lipitor [atorvastatin  Calcium ]  Other (See Comments)   Nortriptyline  Hcl Other (See Comments)   States she felt like worms where crawling under her skin.   Potassium-containing Compounds    Rash and raw areas / sore breast    Pravachol Other (See Comments)   Sulfa Antibiotics Other (See Comments)   Sulfamethoxazole Other (See Comments)   Unknown   Ultram   [tramadol  Hcl] Itching   Welchol [colesevelam Hcl] Other (See Comments)   Zetia [ezetimibe] Other (See Comments)   Zocor [simvastatin] Other (See Comments)        Medication List        Accurate as of October 06, 2024  1:47 PM. If you have any questions, ask your nurse or doctor.          acetaminophen  325 MG tablet Commonly known as: TYLENOL  Take 325 mg by mouth every 6 (six) hours as needed.   apixaban  2.5 MG Tabs tablet Commonly known as: ELIQUIS  Take 1 tablet (2.5 mg total) by mouth 2 (two) times daily.   apixaban  2.5 MG Tabs tablet Commonly known as: ELIQUIS  Take 1 tablet (2.5 mg total) by mouth 2 (two) times daily.   cholecalciferol  25 MCG (1000 UNIT) tablet Commonly known as: VITAMIN D3 Take 1,000 Units by mouth in the morning.   famotidine  20 MG tablet Commonly known as: PEPCID  Take 1 tablet (20 mg total) by mouth 2 (two) times daily.   furosemide  20 MG tablet Commonly known as: LASIX  Take 0.5 tablets (10 mg total) by mouth daily.   levothyroxine  50 MCG tablet Commonly known as: SYNTHROID  TAKE 1 TABLET BY MOUTH EVERY DAY BEFORE BREAKFAST   memantine  5 MG tablet Commonly known as: NAMENDA  TAKE 1 TABLET BY MOUTH TWICE A DAY   metoprolol  succinate 25 MG 24 hr tablet Commonly known as: Toprol  XL Take 1 tablet (25 mg total) by mouth daily.   PARoxetine  20 MG tablet Commonly known as: PAXIL  TAKE 1 TABLET BY MOUTH EVERY DAY   PRESERVISION AREDS 2 PO Take 1 capsule by mouth 2 (two) times daily.   QUEtiapine  50 MG tablet Commonly known as: SEROQUEL  Take 1.5 tablets (75 mg total) by mouth at bedtime.   VITAMIN B-12 PO Take 500 mcg by mouth in the morning.         Objective:   BP 120/70   Pulse 96   Ht 5' 4 (1.626 m)   Wt 124 lb (56.2 kg)   LMP 03/21/1971   SpO2 98%   BMI 21.28 kg/m   Wt Readings from Last 3 Encounters:  10/06/24 124 lb (56.2 kg)  07/17/24 112 lb 3.2 oz (50.9 kg)  07/04/24 114 lb (51.7 kg)    Physical  Exam Physical Exam   VITALS: BP- 122/71 CHEST: Lungs clear to auscultation. CARDIOVASCULAR: Regular heart sounds. EXTREMITIES: Extremities normal with intact pulses.         Assessment & Plan:   Problem List Items Addressed This Visit       Cardiovascular and Mediastinum   Essential hypertension - Primary   Relevant Orders   CMP14+EGFR   CBC With Diff/Platelet   Lipid panel   TSH     Endocrine   Acquired hypothyroidism   Relevant Orders   CMP14+EGFR   CBC With Diff/Platelet   Lipid panel   TSH     Other   HYPERCHOLESTEROLEMIA   Relevant Orders   CMP14+EGFR   CBC With Diff/Platelet   Lipid panel   TSH   Adjustment disorder with mixed anxiety and  depressed mood   Relevant Orders   CMP14+EGFR   CBC With Diff/Platelet   Lipid panel   TSH       Adjustment disorder with mixed anxiety and depressed mood Continues on paroxetine  and quetiapine  for mood stabilization. Mirtazapine was discontinued due to lack of efficacy in appetite stimulation. Current regimen includes 75 mg of quetiapine  at night and 25 mg in the morning, which is helping. - Continue paroxetine  and quetiapine  regimen.  Hypothyroidism Continues on thyroid  medication without issues. Blood work is planned to check thyroid  levels, kidney, and liver function. - Ordered blood work to check thyroid  levels, kidney, and liver function.    Hypertension Blood pressure looks good today, continue current medicine.           Follow up plan: Return if symptoms worsen or fail to improve, for 5 month anxiety hypothyroidism.  Counseling provided for all of the vaccine components Orders Placed This Encounter  Procedures   CMP14+EGFR   CBC With Diff/Platelet   Lipid panel   TSH    Fonda Levins, MD [facility] 10/06/2024, 1:47 PM

## 2024-10-07 LAB — CBC WITH DIFF/PLATELET
Basophils Absolute: 0 x10E3/uL (ref 0.0–0.2)
Basos: 1 %
EOS (ABSOLUTE): 0.3 x10E3/uL (ref 0.0–0.4)
Eos: 6 %
Hematocrit: 42.8 % (ref 34.0–46.6)
Hemoglobin: 14.3 g/dL (ref 11.1–15.9)
Immature Grans (Abs): 0 x10E3/uL (ref 0.0–0.1)
Immature Granulocytes: 0 %
Lymphocytes Absolute: 1.4 x10E3/uL (ref 0.7–3.1)
Lymphs: 26 %
MCH: 32.5 pg (ref 26.6–33.0)
MCHC: 33.4 g/dL (ref 31.5–35.7)
MCV: 97 fL (ref 79–97)
Monocytes Absolute: 0.6 x10E3/uL (ref 0.1–0.9)
Monocytes: 10 %
Neutrophils Absolute: 3.1 x10E3/uL (ref 1.4–7.0)
Neutrophils: 57 %
Platelets: 219 x10E3/uL (ref 150–450)
RBC: 4.4 x10E6/uL (ref 3.77–5.28)
RDW: 12.9 % (ref 11.7–15.4)
WBC: 5.4 x10E3/uL (ref 3.4–10.8)

## 2024-10-07 LAB — CMP14+EGFR
ALT: 9 IU/L (ref 0–32)
AST: 23 IU/L (ref 0–40)
Albumin: 4.1 g/dL (ref 3.7–4.7)
Alkaline Phosphatase: 112 IU/L (ref 48–129)
BUN/Creatinine Ratio: 27 (ref 12–28)
BUN: 37 mg/dL — ABNORMAL HIGH (ref 8–27)
Bilirubin Total: 0.4 mg/dL (ref 0.0–1.2)
CO2: 23 mmol/L (ref 20–29)
Calcium: 9.5 mg/dL (ref 8.7–10.3)
Chloride: 101 mmol/L (ref 96–106)
Creatinine, Ser: 1.36 mg/dL — ABNORMAL HIGH (ref 0.57–1.00)
Globulin, Total: 3.1 g/dL (ref 1.5–4.5)
Glucose: 73 mg/dL (ref 70–99)
Potassium: 4.3 mmol/L (ref 3.5–5.2)
Sodium: 141 mmol/L (ref 134–144)
Total Protein: 7.2 g/dL (ref 6.0–8.5)
eGFR: 37 mL/min/1.73 — ABNORMAL LOW (ref >59)

## 2024-10-07 LAB — LIPID PANEL
Chol/HDL Ratio: 4.9 ratio — ABNORMAL HIGH (ref 0.0–4.4)
Cholesterol, Total: 226 mg/dL — ABNORMAL HIGH (ref 100–199)
HDL: 46 mg/dL (ref >39)
LDL Chol Calc (NIH): 157 mg/dL — ABNORMAL HIGH (ref 0–99)
Triglycerides: 125 mg/dL (ref 0–149)
VLDL Cholesterol Cal: 23 mg/dL (ref 5–40)

## 2024-10-07 LAB — TSH: TSH: 2.34 u[IU]/mL (ref 0.450–4.500)

## 2024-10-07 NOTE — Telephone Encounter (Signed)
 Office called back to say that the procedure would be a whole toenail removal. Please advise

## 2024-10-08 NOTE — Telephone Encounter (Signed)
 Will route to pharm for input on whether whole toenail removal would require anticoag hold.  Last OV was 06/2024 with Dr. Lavona - complex history with AF, chronic HFmREF, significant mitral regurgitation, CAD - but I am not certain an updated virtual visit would add value for the requested medical clearance. Dr. Lavona, once we have pharm input on holding anticoagulation, do you feel patient could medically proceed with whole toenail removal without further evaluation? Seems to be local procedure. - Please route response to [facility] PREOP (the pre-op pool). Thank you.

## 2024-10-13 NOTE — Telephone Encounter (Signed)
     Primary Cardiologist: Lynwood Schilling, MD  Chart reviewed as part of pre-operative protocol coverage. Given past medical history and time since last visit, based on ACC/AHA guidelines, Emily Wagner would be at acceptable risk for the planned procedure without further cardiovascular testing.   No need to hold anticoagulation for upcoming procedure.  She may continue without further cardiac evaluation.  I will route this recommendation to the requesting party via Epic fax function and remove from pre-op pool.  Please call with questions.  Emily Wagner. Shanetra Blumenstock NP-C     10/13/2024, 10:46 AM [facility] [address] [place], [place] [address] Office [phone]

## 2024-10-14 ENCOUNTER — Telehealth: Payer: Self-pay | Admitting: Cardiology

## 2024-10-14 NOTE — Telephone Encounter (Signed)
 Dr Roddie would like to speak with Dr Lavona concerning patient heart care. Please advise

## 2024-10-15 ENCOUNTER — Ambulatory Visit: Payer: Self-pay | Admitting: Family Medicine

## 2024-10-17 ENCOUNTER — Telehealth: Payer: Self-pay | Admitting: Cardiology

## 2024-10-17 NOTE — Telephone Encounter (Signed)
 I spoke with Dr. Roddie today.  He needs to stop anticoagulation to remove the toenail.  She is not at high risk for this and can stop her DOAC for 2 days prior to the procedure.

## 2024-10-17 NOTE — Telephone Encounter (Signed)
 Dr. Roddie calling back to speak with Dr. Lavona   [phone]

## 2024-10-30 ENCOUNTER — Other Ambulatory Visit: Payer: Self-pay | Admitting: Family Medicine

## 2024-10-30 DIAGNOSIS — F4323 Adjustment disorder with mixed anxiety and depressed mood: Secondary | ICD-10-CM

## 2024-10-30 MED ORDER — QUETIAPINE FUMARATE 100 MG PO TABS: 100.0000 mg | ORAL_TABLET | Freq: Every day | ORAL | 1 refills | Status: AC

## 2024-10-30 NOTE — Progress Notes (Signed)
 Increase Seroquel  to 100 mg in the evening to help more with agitation and hallucinations

## 2024-11-29 ENCOUNTER — Other Ambulatory Visit: Payer: Self-pay | Admitting: Family Medicine

## 2024-11-29 DIAGNOSIS — I10 Essential (primary) hypertension: Secondary | ICD-10-CM

## 2024-11-30 ENCOUNTER — Other Ambulatory Visit: Payer: Self-pay | Admitting: Family Medicine

## 2024-11-30 DIAGNOSIS — F4323 Adjustment disorder with mixed anxiety and depressed mood: Secondary | ICD-10-CM

## 2024-12-06 ENCOUNTER — Other Ambulatory Visit: Payer: Self-pay | Admitting: Student

## 2025-03-04 ENCOUNTER — Ambulatory Visit: Admitting: Cardiology

## 2025-03-09 ENCOUNTER — Ambulatory Visit: Admitting: Neurology

## 2025-03-09 ENCOUNTER — Ambulatory Visit: Admitting: Family Medicine
